# Patient Record
Sex: Male | Born: 1950
Health system: Southern US, Community
[De-identification: ages and names within clinical notes are randomized; demographics above are authoritative.]

## PROBLEM LIST (undated history)

## (undated) DIAGNOSIS — I2510 Atherosclerotic heart disease of native coronary artery without angina pectoris: Secondary | ICD-10-CM

## (undated) DIAGNOSIS — M5030 Other cervical disc degeneration, unspecified cervical region: Secondary | ICD-10-CM

## (undated) DIAGNOSIS — N401 Enlarged prostate with lower urinary tract symptoms: Secondary | ICD-10-CM

## (undated) DIAGNOSIS — G894 Chronic pain syndrome: Secondary | ICD-10-CM

## (undated) DIAGNOSIS — I10 Essential (primary) hypertension: Secondary | ICD-10-CM

## (undated) DIAGNOSIS — E785 Hyperlipidemia, unspecified: Secondary | ICD-10-CM

## (undated) DIAGNOSIS — N529 Male erectile dysfunction, unspecified: Secondary | ICD-10-CM

## (undated) DIAGNOSIS — M1990 Unspecified osteoarthritis, unspecified site: Secondary | ICD-10-CM

## (undated) DIAGNOSIS — M5137 Other intervertebral disc degeneration, lumbosacral region: Secondary | ICD-10-CM

## (undated) DIAGNOSIS — J4540 Moderate persistent asthma, uncomplicated: Secondary | ICD-10-CM

## (undated) DIAGNOSIS — K5909 Other constipation: Secondary | ICD-10-CM

## (undated) DIAGNOSIS — E119 Type 2 diabetes mellitus without complications: Secondary | ICD-10-CM

## (undated) DIAGNOSIS — E782 Mixed hyperlipidemia: Secondary | ICD-10-CM

## (undated) DIAGNOSIS — Z7901 Long term (current) use of anticoagulants: Secondary | ICD-10-CM

## (undated) DIAGNOSIS — J45909 Unspecified asthma, uncomplicated: Secondary | ICD-10-CM

## (undated) DIAGNOSIS — R0602 Shortness of breath: Secondary | ICD-10-CM

## (undated) DIAGNOSIS — J449 Chronic obstructive pulmonary disease, unspecified: Secondary | ICD-10-CM

## (undated) DIAGNOSIS — Z972 Presence of dental prosthetic device (complete) (partial): Secondary | ICD-10-CM

## (undated) DIAGNOSIS — Z8709 Personal history of other diseases of the respiratory system: Secondary | ICD-10-CM

## (undated) DIAGNOSIS — M51379 Other intervertebral disc degeneration, lumbosacral region without mention of lumbar back pain or lower extremity pain: Secondary | ICD-10-CM

## (undated) DIAGNOSIS — Z77090 Contact with and (suspected) exposure to asbestos: Secondary | ICD-10-CM

## (undated) HISTORY — DX: Hyperlipidemia, unspecified: E78.5

## (undated) HISTORY — DX: Chronic obstructive pulmonary disease, unspecified: J44.9

## (undated) HISTORY — PX: JOINT REPLACEMENT: SHX530

## (undated) HISTORY — PX: PROSTATE BIOPSY: SHX241

## (undated) HISTORY — DX: Atherosclerotic heart disease of native coronary artery without angina pectoris: I25.10

---

## 1999-11-30 ENCOUNTER — Emergency Department (HOSPITAL_COMMUNITY): Admission: EM | Admit: 1999-11-30 | Discharge: 1999-11-30 | Payer: Self-pay | Admitting: Emergency Medicine

## 2000-11-09 ENCOUNTER — Emergency Department (HOSPITAL_COMMUNITY): Admission: EM | Admit: 2000-11-09 | Discharge: 2000-11-09 | Payer: Self-pay | Admitting: Emergency Medicine

## 2000-11-09 ENCOUNTER — Encounter: Payer: Self-pay | Admitting: Emergency Medicine

## 2001-08-02 ENCOUNTER — Emergency Department (HOSPITAL_COMMUNITY): Admission: EM | Admit: 2001-08-02 | Discharge: 2001-08-02 | Payer: Self-pay | Admitting: Emergency Medicine

## 2001-08-02 ENCOUNTER — Encounter: Payer: Self-pay | Admitting: Emergency Medicine

## 2001-08-28 ENCOUNTER — Emergency Department (HOSPITAL_COMMUNITY): Admission: EM | Admit: 2001-08-28 | Discharge: 2001-08-28 | Payer: Self-pay | Admitting: Emergency Medicine

## 2002-03-25 ENCOUNTER — Emergency Department (HOSPITAL_COMMUNITY): Admission: EM | Admit: 2002-03-25 | Discharge: 2002-03-25 | Payer: Self-pay | Admitting: Emergency Medicine

## 2003-03-28 ENCOUNTER — Emergency Department (HOSPITAL_COMMUNITY): Admission: EM | Admit: 2003-03-28 | Discharge: 2003-03-28 | Payer: Self-pay | Admitting: Emergency Medicine

## 2008-02-01 ENCOUNTER — Emergency Department (HOSPITAL_COMMUNITY): Admission: EM | Admit: 2008-02-01 | Discharge: 2008-02-01 | Payer: Self-pay | Admitting: Emergency Medicine

## 2008-02-18 ENCOUNTER — Emergency Department (HOSPITAL_COMMUNITY): Admission: EM | Admit: 2008-02-18 | Discharge: 2008-02-18 | Payer: Self-pay | Admitting: Emergency Medicine

## 2008-09-29 ENCOUNTER — Ambulatory Visit (HOSPITAL_COMMUNITY): Admission: RE | Admit: 2008-09-29 | Discharge: 2008-09-29 | Payer: Self-pay | Admitting: Orthopedic Surgery

## 2008-10-22 ENCOUNTER — Emergency Department (HOSPITAL_COMMUNITY): Admission: EM | Admit: 2008-10-22 | Discharge: 2008-10-22 | Payer: Self-pay | Admitting: Family Medicine

## 2008-10-23 HISTORY — PX: TOTAL HIP ARTHROPLASTY: SHX124

## 2008-11-09 ENCOUNTER — Emergency Department (HOSPITAL_COMMUNITY): Admission: EM | Admit: 2008-11-09 | Discharge: 2008-11-09 | Payer: Self-pay | Admitting: Emergency Medicine

## 2009-01-05 ENCOUNTER — Encounter: Admission: RE | Admit: 2009-01-05 | Discharge: 2009-01-05 | Payer: Self-pay | Admitting: Family Medicine

## 2009-01-11 ENCOUNTER — Inpatient Hospital Stay (HOSPITAL_COMMUNITY): Admission: RE | Admit: 2009-01-11 | Discharge: 2009-01-14 | Payer: Self-pay | Admitting: Orthopedic Surgery

## 2009-01-11 HISTORY — PX: TOTAL HIP ARTHROPLASTY: SHX124

## 2010-07-25 ENCOUNTER — Emergency Department (HOSPITAL_COMMUNITY): Admission: EM | Admit: 2010-07-25 | Discharge: 2010-07-25 | Payer: Self-pay | Admitting: Family Medicine

## 2011-02-02 LAB — PROTIME-INR
INR: 1.1 (ref 0.00–1.49)
INR: 2 — ABNORMAL HIGH (ref 0.00–1.49)
INR: 2.2 — ABNORMAL HIGH (ref 0.00–1.49)
INR: 1 (ref 0.00–1.49)
Prothrombin Time: 14.1 seconds (ref 11.6–15.2)
Prothrombin Time: 23.4 seconds — ABNORMAL HIGH (ref 11.6–15.2)
Prothrombin Time: 26 seconds — ABNORMAL HIGH (ref 11.6–15.2)
Prothrombin Time: 13.8 seconds (ref 11.6–15.2)

## 2011-02-02 LAB — COMPREHENSIVE METABOLIC PANEL
Albumin: 3.7 g/dL (ref 3.5–5.2)
Alkaline Phosphatase: 50 U/L (ref 39–117)
CO2: 30 mEq/L (ref 19–32)
Calcium: 9.8 mg/dL (ref 8.4–10.5)
Chloride: 107 mEq/L (ref 96–112)
Creatinine, Ser: 1.26 mg/dL (ref 0.4–1.5)
GFR calc Af Amer: >60 mL/min (ref >60)
GFR calc non Af Amer: 59 mL/min — ABNORMAL LOW (ref >60)
Sodium: 143 mEq/L (ref 135–145)
Total Bilirubin: 0.5 mg/dL (ref 0.3–1.2)

## 2011-02-02 LAB — URINALYSIS, ROUTINE W REFLEX MICROSCOPIC
Hgb urine dipstick: NEGATIVE
Nitrite: NEGATIVE
Protein, ur: NEGATIVE mg/dL
Specific Gravity, Urine: 1.035 — ABNORMAL HIGH (ref 1.005–1.030)
Urobilinogen, UA: 0.2 mg/dL (ref 0.0–1.0)

## 2011-02-02 LAB — CBC
HCT: 31.1 % — ABNORMAL LOW (ref 39.0–52.0)
MCHC: 32.9 g/dL (ref 30.0–36.0)
MCHC: 33.1 g/dL (ref 30.0–36.0)
MCV: 84.2 fL (ref 78.0–100.0)
Platelets: 188 10*3/uL (ref 150–400)
Platelets: 219 10*3/uL (ref 150–400)
Platelets: 240 10*3/uL (ref 150–400)
Platelets: 231 10*3/uL (ref 150–400)
RBC: 3.67 MIL/uL — ABNORMAL LOW (ref 4.22–5.81)
RBC: 4.31 MIL/uL (ref 4.22–5.81)
RBC: 4.89 MIL/uL (ref 4.22–5.81)
RDW: 13.9 % (ref 11.5–15.5)
WBC: 9.3 10*3/uL (ref 4.0–10.5)
WBC: 9.3 10*3/uL (ref 4.0–10.5)
WBC: 8.4 10*3/uL (ref 4.0–10.5)

## 2011-02-02 LAB — BASIC METABOLIC PANEL
BUN: 10 mg/dL (ref 6–23)
BUN: 6 mg/dL (ref 6–23)
CO2: 28 mEq/L (ref 19–32)
Calcium: 8.1 mg/dL — ABNORMAL LOW (ref 8.4–10.5)
Calcium: 8.4 mg/dL (ref 8.4–10.5)
Creatinine, Ser: 1.04 mg/dL (ref 0.4–1.5)
Creatinine, Ser: 1.27 mg/dL (ref 0.4–1.5)
GFR calc Af Amer: >60 mL/min (ref >60)
GFR calc non Af Amer: >60 mL/min (ref >60)
Glucose, Bld: 137 mg/dL — ABNORMAL HIGH (ref 70–99)
Potassium: 4 mEq/L (ref 3.5–5.1)

## 2011-02-02 LAB — ABO/RH: ABO/RH(D): A POS

## 2011-02-02 LAB — TYPE AND SCREEN
ABO/RH(D): A POS
Antibody Screen: NEGATIVE

## 2011-03-07 NOTE — Discharge Summary (Signed)
NAMEJAMERION, CABELLO                ACCOUNT NO.:  [id number]   MEDICAL RECORD NO.:  [id number]          PATIENT TYPE:  INP   LOCATION:  1602                         FACILITY:  [facility]   PHYSICIAN:  Ollen Gross, M.D.    DATE OF BIRTH:  [birth year]   DATE OF ADMISSION:  01/11/2009  DATE OF DISCHARGE:  01/14/2009                               DISCHARGE SUMMARY   ADMITTING DIAGNOSES:  1. Osteoarthritis right hip.  2. Asthma.  3. History of bronchitis.  4. Hypertension.   DISCHARGE DIAGNOSES:  1. Osteoarthritis right hip status post right total hip replacement      arthroplasty.  2. Mild postop blood loss anemia.  3. Asthma.  4. History of bronchitis.  5. Hypertension.   PROCEDURE:  January 11, 2009, right total hip.  Surgeon [name],  assistant Avel Peace, PA-C.  Surgery done under general anesthesia.   CONSULTATIONS:  None.   BRIEF HISTORY:  The patient is a 60 year old male with severe end-stage  arthritis right hip, aggressive worsening pain dysfunction, failed  operative management and now presents for total hip arthroplasty.   LABORATORY DATA:  Preop CBC showed hemoglobin 13.6, hematocrit 41.2,  white cell count 8.4, platelets 231.  PT/INR 13.8 and 1.0, PTT of 28.  Chem panel on admission within normal limits.  Preop UA negative.  Postop hemoglobin down to 12.0 then 10.2, last H&H stabilized at 10.2  and 31.2.  Serial protimes followed per Coumadin protocol.  Last PT/INR  26.0 and 2.2.  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  Electrolytes remained  within normal limits.   X-RAYS:  Right hip on January 04, 2009, advanced right greater than left  hip osteoarthritis with __________bone and/or focal myositis  __________partially __________bilateral medial thighs.   HOSPITAL COURSE:  The patient was admitted to [facility],  taken to OR, underwent above-stated procedure without complication.  The  patient tolerated seizure well, later transferred to the recovery room  on  orthopedic floor.  Started on PCA and p.o. analgesic pain control  following surgery given 24 hours postop IV antibiotics.  Started on p.o.  and IV analgesics.  Weaned over to p.o. medications.  Started getting up  on day #1.  Had a little bit of lightheadedness, dizziness and his  pressure was a little low, so we held his blood pressure medications and  put them on parameters.  Gave him a little fluids which helped.  He was  feeling better after the fluid resuscitation.  Hemoglobin was stable.  By day #2, he was doing better, walking about 80 feet.  Dressing  changed.  Incision looked good.  Continued to progress well, and by day  #3, he was walking over 200 feet, tolerating his medications and was  discharged home.   DISCHARGE/PLAN:  1. Discharged home on January 14, 2009.  2. Discharge diagnosis above.  3. Discharge medication:  Percocet, Robaxin, Coumadin.  4. Diet:  Low-sodium heart-healthy diet.  5. Activity:  Partial weightbearing 25-50% right leg, hip precautions,      total hip protocol.   FOLLOWUP:  Follow-up in 2 weeks.  DISPOSITION:  Home.   CONDITION ON DISCHARGE:  Improved.      Alexzandrew L. Perkins, P.A.C.      Ollen Gross, M.D.  Electronically Signed    ALP/MEDQ  D:  01/14/2009  T:  01/14/2009  Job:  [id number]   cc:   Ollen Gross, M.D.  Fax: [phone]   Ace Gins, MD

## 2011-03-07 NOTE — Op Note (Signed)
[name], [name]                ACCOUNT NO.:  [id number]   MEDICAL RECORD NO.:  [id number]          PATIENT TYPE:  INP   LOCATION:  0002                         FACILITY:  [facility]   PHYSICIAN:  Ollen Gross, M.D.    DATE OF BIRTH:  1950-11-23   DATE OF PROCEDURE:  01/11/2009  DATE OF DISCHARGE:                               OPERATIVE REPORT   PREOPERATIVE DIAGNOSIS:  Osteoarthritis right hip.   POSTOPERATIVE DIAGNOSIS:  Osteoarthritis right hip.   PROCEDURE:  Right total hip arthroplasty.   SURGEON:  [name].   ASSISTANT:  Avel Peace, PA-C.   ANESTHESIA:  General.   ESTIMATED BLOOD LOSS:  200.   DRAIN:  Hemovac x1.   COMPLICATIONS:  None.   CONDITION:  Stable to recovery.   BRIEF CLINICAL NOTE:  Evan Brock is a 60 year old male with severe end-stage  arthritis of the right hip with progressively worsening pain and  dysfunction.  He has failed nonoperative management and presents for  total hip arthroplasty.   PROCEDURE IN DETAIL:  After the successful administration of general  anesthetic, the patient was placed in the left lateral decubitus  position with the right side up and held with the hip positioner.  The  right lower extremity was isolated from his perineum with plastic drapes  and prepped and draped in the usual sterile fashion.  A short  posterolateral incision was made with 10 blade through subcutaneous  tissue to the level of the fascia lata which was incised in line with  the skin incision.  The sciatic nerve was palpated and protected and the  short external rotators isolated off the femur.  Capsulectomy was  performed and the hip was dislocated.  The center of the femoral head  was marked and a trial prosthesis was placed such that the center of the  trial head corresponds to the center of his native femoral head.  Osteotomy line was marked on the femoral neck and osteotomy made with an  oscillating saw.  The femoral head was removed and the femur  was  retracted anteriorly to gain acetabular exposure.   Acetabular retractors were placed and labrum and osteophytes removed.  Acetabular reaming starts at 47 mm coursing increments of 2-53 mm and  then a 54-mm pinnacle acetabular shell was impacted in anatomic position  with outstanding purchase.  We did not need any provisional screw  fixation.  The apex hole eliminator was placed and then the 36-mm  neutral Ultamet metal liner was placed for metal-on-metal hip  replacement.   The femur was prepared with the canal finder and irrigation.  Axial  reaming was performed to 13.5 mm, proximal reaming to an 85F and the  sleeve machined to a large.  A 85F large trial sleeve was placed with an  18 x 13 stem and 36 plus 8 neck 10 degrees beyond native anteversion.  The 36 plus 0 trial head was placed.  It was a little lax, so we went to  36 plus 6 which had more appropriate soft tissue tension.  He had great  stability with full extension,  full external rotation, 70 degrees  flexion, 40 degrees adduction, 90 degrees internal rotation, 90 degrees  of flexion and 70 degrees of internal rotation.  By placing the right  leg on top of the left it felt like the leg lengths were equal.  The hip  was dislocated and the trials removed.  The permanent 42F large sleeve  was placed with an 18 x 13 stem, 36 plus 8 neck 10 degrees beyond native  anteversion.  A 36 plus 6 head was placed and the hip was reduced with  the same stability parameters.  The wound was copiously irrigated with  saline solution and short rotators reattached to the femur through drill  holes.  The fascia lata was closed over a Hemovac drain with interrupted  #1 Vicryl, subcu closed with #1 and 2-0 Vicryl and subcuticular running  4-0 Monocryl.  The drain was hooked to suction.  The incision cleaned  and dried and Steri-Strips and a bulky sterile dressing applied.  He was  then placed into a knee immobilizer, awakened and transported  to  recovery in stable condition.      Ollen Gross, M.D.  Electronically Signed     FA/MEDQ  D:  01/11/2009  T:  01/11/2009  Job:  [id number]

## 2011-03-07 NOTE — H&P (Signed)
Evan Brock, Evan Brock                ACCOUNT NO.:  [id number]   MEDICAL RECORD NO.:  [id number]          PATIENT TYPE:  INP   LOCATION:                               FACILITY:  [facility]   PHYSICIAN:  Ollen Gross, M.D.    DATE OF BIRTH:  1951-06-11   DATE OF ADMISSION:  01/11/2009  DATE OF DISCHARGE:                              HISTORY & PHYSICAL   CHIEF COMPLAINT:  Right hip pain.   HISTORY OF PRESENT ILLNESS:  The patient is a 61 year old male who has  been seen by [name] for ongoing severe right hip pain that has been  ongoing for over a year now.  It is mainly in the groin radiating down  to the lateral hip and into the knee.  It has progressively gotten  worse.  He has been seen in consultation by [name] in January of  this year.  He has been found to have end-stage arthritis, severe with  bone-on-bone changes and flattening of the femoral head and some erosion  already of the acetabulum with large osteophyte formation.  It is felt  he would benefit from undergoing surgical intervention.  Risks and  benefits have been discussed.  He elects to proceed with surgery.  He  has been seen presurgically by Dr. Noel Journey and felt to be cleared  without reservation.   ALLERGIES:  No known drug allergies.   CURRENT MEDICATIONS:  He is currently on a pain pill and antibiotic to  cover his asthma which appears to be a Z-Pak.  He is on an inhaler and  also takes clonidine.   PAST MEDICAL HISTORY:  Asthma, bronchitis, hypertension.   PAST SURGICAL HISTORY:  Is negative.   FAMILY HISTORY:  Father with arthritis.  Mother deceased age 69 who was  a diabetic.  He has six siblings all in good health.   SOCIAL HISTORY:  Single.  Works with [employer] lab.  Nonsmoker.  One to  three beers a day.  No children.  Five steps entering his home.   REVIEW OF SYSTEMS:  GENERAL:  No fevers, chills, night sweats.  NEURO:  No seizures, syncope or paralysis.  RESPIRATORY:  A little bit of  shortness of breath on exertion.  He has had a little bit of wheezing  which Dr. Larina Bras put him on a Z-Pak for preoperatively.  No shortness of  breath.  No productive cough.  CARDIOVASCULAR:  No chest pain, angina,  orthopnea.  GI:  No nausea, vomiting, diarrhea, constipation.  GU:  No  dysuria, hematuria, discharge.  MUSCULOSKELETAL:  Hip pain.   PHYSICAL EXAMINATION:  VITAL SIGNS:  Pulse 76, respirations 16, blood  pressure 140/88.  GENERAL:  A 60 year old [place] male well-nourished, well-  developed, in no acute distress.  He is alert and oriented, cooperative,  pleasant.  HEENT:  Normocephalic, atraumatic.  Pupils are round and reactive.  EOMs  intact.  NECK:  Neck is supple.  CHEST:  Clear with the exception of very faint stridor anterior chest  wall, does improve with coughing.  HEART:  Regular rate and  rhythm.  No murmur, S1-S2 noted.  ABDOMEN:  Soft, nontender, slightly round.  Bowel sounds present.  RECTAL:  Not done.  Not pertinent to present illness.  GENITALIA:  Not done.  Not pertinent to present illness.  EXTREMITIES:  Right hip range of motion flexion 90 degrees, zero  internal rotation, zero external rotation, no abduction or no adduction.  He has normal knee exam.   IMPRESSION:  Osteoarthritis right hip.   PLAN:  The patient will be admitted to [facility] to undergo a  right total hip replacement arthroplasty.  Surgery will be performed by  Dr. Ollen Gross.      Alexzandrew L. Perkins, P.A.C.      Ollen Gross, M.D.  Electronically Signed    ALP/MEDQ  D:  01/10/2009  T:  01/10/2009  Job:  [id number]   cc:   Ace Gins, MD

## 2011-07-05 ENCOUNTER — Emergency Department (HOSPITAL_COMMUNITY): Payer: Self-pay

## 2011-07-05 ENCOUNTER — Emergency Department (HOSPITAL_COMMUNITY)
Admission: EM | Admit: 2011-07-05 | Discharge: 2011-07-05 | Disposition: A | Discharge status: Home / Self Care | Payer: Self-pay | Attending: Emergency Medicine | Admitting: Emergency Medicine

## 2011-07-05 DIAGNOSIS — R220 Localized swelling, mass and lump, head: Secondary | ICD-10-CM | POA: Insufficient documentation

## 2011-07-05 DIAGNOSIS — I10 Essential (primary) hypertension: Secondary | ICD-10-CM | POA: Insufficient documentation

## 2011-07-05 DIAGNOSIS — R059 Cough, unspecified: Secondary | ICD-10-CM | POA: Insufficient documentation

## 2011-07-05 DIAGNOSIS — R0602 Shortness of breath: Secondary | ICD-10-CM | POA: Insufficient documentation

## 2011-07-05 DIAGNOSIS — M542 Cervicalgia: Secondary | ICD-10-CM | POA: Insufficient documentation

## 2011-07-05 DIAGNOSIS — J45909 Unspecified asthma, uncomplicated: Secondary | ICD-10-CM | POA: Insufficient documentation

## 2011-07-05 DIAGNOSIS — J3489 Other specified disorders of nose and nasal sinuses: Secondary | ICD-10-CM | POA: Insufficient documentation

## 2011-07-05 DIAGNOSIS — R0609 Other forms of dyspnea: Secondary | ICD-10-CM | POA: Insufficient documentation

## 2011-07-05 DIAGNOSIS — R110 Nausea: Secondary | ICD-10-CM | POA: Insufficient documentation

## 2011-07-05 DIAGNOSIS — R0989 Other specified symptoms and signs involving the circulatory and respiratory systems: Secondary | ICD-10-CM | POA: Insufficient documentation

## 2011-07-05 DIAGNOSIS — R05 Cough: Secondary | ICD-10-CM | POA: Insufficient documentation

## 2011-07-05 DIAGNOSIS — E119 Type 2 diabetes mellitus without complications: Secondary | ICD-10-CM | POA: Insufficient documentation

## 2011-07-18 LAB — POCT I-STAT, CHEM 8
Calcium, Ion: 1.15
Glucose, Bld: 109 — ABNORMAL HIGH
HCT: 44
Hemoglobin: 15
Potassium: 4
TCO2: 29

## 2011-07-28 LAB — URINE CULTURE
Colony Count: NO GROWTH
Special Requests: NEGATIVE

## 2011-07-28 LAB — CBC
Platelets: 240 10*3/uL (ref 150–400)
RDW: 14.6 % (ref 11.5–15.5)

## 2011-07-28 LAB — DIFFERENTIAL
Basophils Relative: 1 % (ref 0–1)
Eosinophils Absolute: 0.1 10*3/uL (ref 0.0–0.7)
Lymphs Abs: 1 10*3/uL (ref 0.7–4.0)
Monocytes Absolute: 0.7 10*3/uL (ref 0.1–1.0)
Monocytes Relative: 10 % (ref 3–12)

## 2011-07-28 LAB — URINALYSIS, ROUTINE W REFLEX MICROSCOPIC
Bilirubin Urine: NEGATIVE
Hgb urine dipstick: NEGATIVE
Nitrite: NEGATIVE
Specific Gravity, Urine: 1.02 (ref 1.005–1.030)
Urobilinogen, UA: 0.2 mg/dL (ref 0.0–1.0)
pH: 6.5 (ref 5.0–8.0)

## 2011-07-28 LAB — COMPREHENSIVE METABOLIC PANEL
ALT: 22 U/L (ref 0–53)
Albumin: 3.6 g/dL (ref 3.5–5.2)
Alkaline Phosphatase: 49 U/L (ref 39–117)
Calcium: 9.8 mg/dL (ref 8.4–10.5)
GFR calc Af Amer: >60 mL/min (ref >60)
Potassium: 4.4 mEq/L (ref 3.5–5.1)
Sodium: 140 mEq/L (ref 135–145)
Total Protein: 7.2 g/dL (ref 6.0–8.3)

## 2011-07-28 LAB — APTT: aPTT: 26 seconds (ref 24–37)

## 2011-08-04 ENCOUNTER — Other Ambulatory Visit: Payer: Self-pay | Admitting: Family Medicine

## 2011-08-17 ENCOUNTER — Emergency Department (HOSPITAL_COMMUNITY)
Admission: EM | Admit: 2011-08-17 | Discharge: 2011-08-17 | Disposition: A | Discharge status: Home / Self Care | Payer: Self-pay | Attending: Emergency Medicine | Admitting: Emergency Medicine

## 2011-08-17 ENCOUNTER — Inpatient Hospital Stay (INDEPENDENT_AMBULATORY_CARE_PROVIDER_SITE_OTHER)
Admission: RE | Admit: 2011-08-17 | Discharge: 2011-08-17 | Disposition: A | Discharge status: Home / Self Care | Payer: Self-pay | Source: Ambulatory Visit | Attending: Family Medicine | Admitting: Family Medicine

## 2011-08-17 DIAGNOSIS — I10 Essential (primary) hypertension: Secondary | ICD-10-CM | POA: Insufficient documentation

## 2011-08-17 DIAGNOSIS — E119 Type 2 diabetes mellitus without complications: Secondary | ICD-10-CM | POA: Insufficient documentation

## 2011-08-17 DIAGNOSIS — J45909 Unspecified asthma, uncomplicated: Secondary | ICD-10-CM

## 2011-10-05 ENCOUNTER — Emergency Department (HOSPITAL_COMMUNITY): Payer: Self-pay

## 2011-10-05 ENCOUNTER — Emergency Department (HOSPITAL_COMMUNITY)
Admission: EM | Admit: 2011-10-05 | Discharge: 2011-10-05 | Disposition: A | Discharge status: Home / Self Care | Payer: Self-pay | Attending: Emergency Medicine | Admitting: Emergency Medicine

## 2011-10-05 ENCOUNTER — Encounter: Payer: Self-pay | Admitting: Nurse Practitioner

## 2011-10-05 DIAGNOSIS — IMO0001 Reserved for inherently not codable concepts without codable children: Secondary | ICD-10-CM | POA: Insufficient documentation

## 2011-10-05 DIAGNOSIS — R197 Diarrhea, unspecified: Secondary | ICD-10-CM | POA: Insufficient documentation

## 2011-10-05 DIAGNOSIS — R0602 Shortness of breath: Secondary | ICD-10-CM | POA: Insufficient documentation

## 2011-10-05 DIAGNOSIS — I10 Essential (primary) hypertension: Secondary | ICD-10-CM | POA: Insufficient documentation

## 2011-10-05 DIAGNOSIS — B9789 Other viral agents as the cause of diseases classified elsewhere: Secondary | ICD-10-CM | POA: Insufficient documentation

## 2011-10-05 DIAGNOSIS — R630 Anorexia: Secondary | ICD-10-CM | POA: Insufficient documentation

## 2011-10-05 DIAGNOSIS — R000 Tachycardia, unspecified: Secondary | ICD-10-CM | POA: Insufficient documentation

## 2011-10-05 DIAGNOSIS — R51 Headache: Secondary | ICD-10-CM | POA: Insufficient documentation

## 2011-10-05 DIAGNOSIS — R110 Nausea: Secondary | ICD-10-CM | POA: Insufficient documentation

## 2011-10-05 DIAGNOSIS — R05 Cough: Secondary | ICD-10-CM | POA: Insufficient documentation

## 2011-10-05 DIAGNOSIS — R5383 Other fatigue: Secondary | ICD-10-CM | POA: Insufficient documentation

## 2011-10-05 DIAGNOSIS — R070 Pain in throat: Secondary | ICD-10-CM | POA: Insufficient documentation

## 2011-10-05 DIAGNOSIS — R059 Cough, unspecified: Secondary | ICD-10-CM | POA: Insufficient documentation

## 2011-10-05 DIAGNOSIS — R5381 Other malaise: Secondary | ICD-10-CM | POA: Insufficient documentation

## 2011-10-05 DIAGNOSIS — R509 Fever, unspecified: Secondary | ICD-10-CM | POA: Insufficient documentation

## 2011-10-05 DIAGNOSIS — B349 Viral infection, unspecified: Secondary | ICD-10-CM

## 2011-10-05 HISTORY — DX: Essential (primary) hypertension: I10

## 2011-10-05 LAB — CBC
Hemoglobin: 13.8 g/dL (ref 13.0–17.0)
MCH: 26.3 pg (ref 26.0–34.0)
MCHC: 32.1 g/dL (ref 30.0–36.0)
Platelets: 192 10*3/uL (ref 150–400)

## 2011-10-05 LAB — DIFFERENTIAL
Basophils Absolute: 0 10*3/uL (ref 0.0–0.1)
Basophils Relative: 0 % (ref 0–1)
Eosinophils Absolute: 0 10*3/uL (ref 0.0–0.7)
Lymphocytes Relative: 13 % (ref 12–46)
Monocytes Absolute: 1.2 10*3/uL — ABNORMAL HIGH (ref 0.1–1.0)
Neutrophils Relative %: 75 % (ref 43–77)

## 2011-10-05 LAB — URINALYSIS, ROUTINE W REFLEX MICROSCOPIC
Bilirubin Urine: NEGATIVE
Ketones, ur: 15 mg/dL — AB
Leukocytes, UA: NEGATIVE
Nitrite: NEGATIVE
Protein, ur: NEGATIVE mg/dL
Urobilinogen, UA: 1 mg/dL (ref 0.0–1.0)
pH: 6 (ref 5.0–8.0)

## 2011-10-05 LAB — COMPREHENSIVE METABOLIC PANEL
AST: 39 U/L — ABNORMAL HIGH (ref 0–37)
Albumin: 3.3 g/dL — ABNORMAL LOW (ref 3.5–5.2)
BUN: 16 mg/dL (ref 6–23)
Calcium: 8.5 mg/dL (ref 8.4–10.5)
Chloride: 91 mEq/L — ABNORMAL LOW (ref 96–112)
Creatinine, Ser: 1.14 mg/dL (ref 0.50–1.35)
Total Bilirubin: 0.4 mg/dL (ref 0.3–1.2)
Total Protein: 7.3 g/dL (ref 6.0–8.3)

## 2011-10-05 MED ORDER — SODIUM CHLORIDE 0.9 % IV SOLN
INTRAVENOUS | Status: DC
  Administered 2011-10-05: 125 mL via INTRAVENOUS

## 2011-10-05 MED ORDER — SODIUM CHLORIDE 0.9 % IV BOLUS (SEPSIS)
1000.0000 mL | Freq: Once | INTRAVENOUS | Status: AC
  Administered 2011-10-05: 1000 mL via INTRAVENOUS

## 2011-10-05 MED ORDER — ONDANSETRON HCL 4 MG/2ML IJ SOLN
4.0000 mg | Freq: Once | INTRAMUSCULAR | Status: AC
  Administered 2011-10-05: 4 mg via INTRAVENOUS
  Filled 2011-10-05: qty 2

## 2011-10-05 MED ORDER — ACETAMINOPHEN 325 MG PO TABS
650.0000 mg | ORAL_TABLET | Freq: Once | ORAL | Status: AC
  Administered 2011-10-05: 650 mg via ORAL
  Filled 2011-10-05: qty 2

## 2011-10-05 MED ORDER — PROMETHAZINE HCL 25 MG PO TABS: 25.0000 mg | ORAL_TABLET | Freq: Four times a day (QID) | ORAL | Status: AC | PRN

## 2011-10-05 MED ORDER — ALBUTEROL SULFATE (5 MG/ML) 0.5% IN NEBU
5.0000 mg | INHALATION_SOLUTION | Freq: Once | RESPIRATORY_TRACT | Status: AC
  Administered 2011-10-05: 5 mg via RESPIRATORY_TRACT
  Filled 2011-10-05: qty 1

## 2011-10-05 NOTE — ED Provider Notes (Signed)
History     CSN: [id number] Arrival date & time: 10/05/2011  2:14 PM   First MD Initiated Contact with Patient 10/05/11 1422      Chief Complaint  Patient presents with  . URI    (Consider location/radiation/quality/duration/timing/severity/associated sxs/prior treatment) Patient is a 60 y.o. male presenting with URI. The history is provided by the patient.  URI The primary symptoms include fever, fatigue, sore throat, cough, nausea and myalgias. Primary symptoms do not include abdominal pain, vomiting or rash. The current episode started 3 to 5 days ago.  Symptoms associated with the illness include chills.   patient has had a cough and fever since Sunday. He also had nausea and been unable to eat since Sunday. He states he had diarrhea for a couple days but that has resolved. He said myalgias 2. He states chief meals weak all over and has a headache. He's had some production with the cough. Mild sore throat. No dysuria. He states that he feels bad all over. He does not smoke. No clear sick contacts.  Past Medical History  Diagnosis Date  . Hypertension     Past Surgical History  Procedure Date  . Total hip arthroplasty     History reviewed. No pertinent family history.  History  Substance Use Topics  . Smoking status: Never Smoker   . Smokeless tobacco: Not on file  . Alcohol Use: Yes     social      Review of Systems  Constitutional: Positive for fever, chills and fatigue. Negative for appetite change.  HENT: Positive for sore throat.   Respiratory: Positive for cough and shortness of breath.   Cardiovascular: Negative for chest pain.  Gastrointestinal: Positive for nausea and diarrhea. Negative for vomiting and abdominal pain.  Genitourinary: Negative for dysuria.  Musculoskeletal: Positive for myalgias.  Skin: Negative for rash.  Neurological: Negative for light-headedness.  Psychiatric/Behavioral: Negative for confusion.    Allergies  Review of patient's  allergies indicates no known allergies.  Home Medications  No current outpatient prescriptions on file.  BP 135/82  Pulse 107  Temp(Src) 103 F (39.4 C) (Oral)  Resp 20  Ht 5\' 9"  (1.753 m)  Wt 195 lb (88.451 kg)  BMI 28.80 kg/m2  SpO2 94%  Physical Exam  Nursing note and vitals reviewed. Constitutional: He is oriented to person, place, and time. He appears well-developed and well-nourished.  HENT:  Head: Normocephalic and atraumatic.  Eyes: EOM are normal. Pupils are equal, round, and reactive to light.  Neck: Normal range of motion. Neck supple.  Cardiovascular: Regular rhythm and normal heart sounds.   No murmur heard.      Tachycardia  Pulmonary/Chest: Effort normal. He has wheezes.       Diffuse wheezes and harsh breath sounds  Abdominal: Soft. Bowel sounds are normal. He exhibits no distension and no mass. There is no tenderness. There is no rebound and no guarding.  Musculoskeletal: Normal range of motion. He exhibits no edema.  Neurological: He is alert and oriented to person, place, and time. No cranial nerve deficit.  Skin: Skin is warm and dry.  Psychiatric: He has a normal mood and affect.    ED Course  Procedures (including critical care time)   Labs Reviewed  CBC  DIFFERENTIAL  URINALYSIS, ROUTINE W REFLEX MICROSCOPIC  COMPREHENSIVE METABOLIC PANEL   No results found.   No diagnosis found.    MDM  Cough and fever for the last 2 days. Nauseousness without vomiting. Fever of 103  here. Chest x-ray and lab work pending. Care will be turned over to Dr. Effie Shy.        Juliet Rude. Rubin Payor, MD 10/05/11 [phone]

## 2011-10-05 NOTE — ED Provider Notes (Signed)
Patient received from Dr. Rubin Payor, to evaluate after labs returned ann treatment given. He feels more comfortable after albuterol nebulizer and Zofran injection. He has also received IV fluids. He is tolerating some oral fluids at this time. Evaluation is consistent with a nonspecific viral illness. He has albuterol at home. He'll be given a prescription for Phenergan .  Results for orders placed during the hospital encounter of 10/05/11  CBC      Component Value Range   WBC 10.1  4.0 - 10.5 (K/uL)   RBC 5.24  4.22 - 5.81 (MIL/uL)   Hemoglobin 13.8  13.0 - 17.0 (g/dL)   HCT 16.1  09.6 - 04.5 (%)   MCV 82.1  78.0 - 100.0 (fL)   MCH 26.3  26.0 - 34.0 (pg)   MCHC 32.1  30.0 - 36.0 (g/dL)   RDW 40.9  81.1 - 91.4 (%)   Platelets 192  150 - 400 (K/uL)  DIFFERENTIAL      Component Value Range   Neutrophils Relative 75  43 - 77 (%)   Lymphocytes Relative 13  12 - 46 (%)   Monocytes Relative 12  3 - 12 (%)   Eosinophils Relative 0  0 - 5 (%)   Basophils Relative 0  0 - 1 (%)   Neutro Abs 7.6  1.7 - 7.7 (K/uL)   Lymphs Abs 1.3  0.7 - 4.0 (K/uL)   Monocytes Absolute 1.2 (*) 0.1 - 1.0 (K/uL)   Eosinophils Absolute 0.0  0.0 - 0.7 (K/uL)   Basophils Absolute 0.0  0.0 - 0.1 (K/uL)   WBC Morphology ATYPICAL LYMPHOCYTES     Smear Review LARGE PLATELETS PRESENT    URINALYSIS, ROUTINE W REFLEX MICROSCOPIC      Component Value Range   Color, Urine YELLOW  YELLOW    APPearance CLEAR  CLEAR    Specific Gravity, Urine 1.021  1.005 - 1.030    pH 6.0  5.0 - 8.0    Glucose, UA NEGATIVE  NEGATIVE (mg/dL)   Hgb urine dipstick NEGATIVE  NEGATIVE    Bilirubin Urine NEGATIVE  NEGATIVE    Ketones, ur 15 (*) NEGATIVE (mg/dL)   Protein, ur NEGATIVE  NEGATIVE (mg/dL)   Urobilinogen, UA 1.0  0.0 - 1.0 (mg/dL)   Nitrite NEGATIVE  NEGATIVE    Leukocytes, UA NEGATIVE  NEGATIVE   COMPREHENSIVE METABOLIC PANEL      Component Value Range   Sodium 131 (*) 135 - 145 (mEq/L)   Potassium 4.3  3.5 - 5.1 (mEq/L)   Chloride 91 (*) 96 - 112 (mEq/L)   CO2 29  19 - 32 (mEq/L)   Glucose, Bld 101 (*) 70 - 99 (mg/dL)   BUN 16  6 - 23 (mg/dL)   Creatinine, Ser 7.82  0.50 - 1.35 (mg/dL)   Calcium 8.5  8.4 - 95.6 (mg/dL)   Total Protein 7.3  6.0 - 8.3 (g/dL)   Albumin 3.3 (*) 3.5 - 5.2 (g/dL)   AST 39 (*) 0 - 37 (U/L)   ALT 24  0 - 53 (U/L)   Alkaline Phosphatase 61  39 - 117 (U/L)   Total Bilirubin 0.4  0.3 - 1.2 (mg/dL)   GFR calc non Af Amer 68 (*) >90 (mL/min)   GFR calc Af Amer 78 (*) >90 (mL/min)  Dg Chest 2 View  10/05/2011  *RADIOLOGY REPORT*  Clinical Data: Cough, chest pain, shortness of breath  CHEST - 2 VIEW  Comparison: 07/05/2011; 01/05/2009; 02/18/2008  Findings: Grossly unchanged  cardiac silhouette and mediastinal contours.  Peribronchial thickening again seen about the bilateral hila.  No new focal airspace opacities.  There is persistent mild elevation of left hemidiaphragm.  No pleural effusion or pneumothorax.  Unchanged bones.  IMPRESSION: Grossly unchanged peribronchial thickening without focal airspace opacity to suggest pneumonia.  Original Report Authenticated By: Waynard Reeds, M.D.           Flint Melter, MD 10/09/11 [phone]

## 2011-10-05 NOTE — ED Notes (Signed)
Pt reports cough, body aches, fever, and headache since Sunday. Worse over night. Pt alert and oriented. Placed on droplet precautions. MD Pickering at bedside.

## 2011-10-05 NOTE — ED Notes (Signed)
C/o headache, productive cough, nausea, unable to tolerate oral intake since sunday

## 2013-02-15 ENCOUNTER — Ambulatory Visit (HOSPITAL_COMMUNITY)
Admission: RE | Admit: 2013-02-15 | Discharge: 2013-02-15 | Disposition: A | Discharge status: Home / Self Care | Payer: Medicare Other | Source: Ambulatory Visit | Attending: Internal Medicine | Admitting: Internal Medicine

## 2013-02-15 ENCOUNTER — Other Ambulatory Visit (HOSPITAL_COMMUNITY): Payer: Self-pay | Admitting: Internal Medicine

## 2013-02-15 DIAGNOSIS — M51379 Other intervertebral disc degeneration, lumbosacral region without mention of lumbar back pain or lower extremity pain: Secondary | ICD-10-CM | POA: Insufficient documentation

## 2013-02-15 DIAGNOSIS — M79609 Pain in unspecified limb: Secondary | ICD-10-CM

## 2013-02-15 DIAGNOSIS — M169 Osteoarthritis of hip, unspecified: Secondary | ICD-10-CM | POA: Insufficient documentation

## 2013-02-15 DIAGNOSIS — M545 Low back pain, unspecified: Secondary | ICD-10-CM

## 2013-02-15 DIAGNOSIS — M1610 Unilateral primary osteoarthritis, unspecified hip: Secondary | ICD-10-CM | POA: Insufficient documentation

## 2013-02-15 DIAGNOSIS — M5137 Other intervertebral disc degeneration, lumbosacral region: Secondary | ICD-10-CM | POA: Insufficient documentation

## 2013-02-15 DIAGNOSIS — M47817 Spondylosis without myelopathy or radiculopathy, lumbosacral region: Secondary | ICD-10-CM | POA: Insufficient documentation

## 2013-02-15 DIAGNOSIS — M25559 Pain in unspecified hip: Secondary | ICD-10-CM | POA: Insufficient documentation

## 2013-02-22 ENCOUNTER — Emergency Department (HOSPITAL_COMMUNITY): Payer: Medicare Other

## 2013-02-22 ENCOUNTER — Emergency Department (HOSPITAL_COMMUNITY)
Admission: EM | Admit: 2013-02-22 | Discharge: 2013-02-22 | Disposition: A | Discharge status: Home / Self Care | Payer: Medicare Other | Attending: Emergency Medicine | Admitting: Emergency Medicine

## 2013-02-22 ENCOUNTER — Encounter (HOSPITAL_COMMUNITY): Payer: Self-pay | Admitting: *Deleted

## 2013-02-22 DIAGNOSIS — Z79899 Other long term (current) drug therapy: Secondary | ICD-10-CM | POA: Insufficient documentation

## 2013-02-22 DIAGNOSIS — I10 Essential (primary) hypertension: Secondary | ICD-10-CM | POA: Insufficient documentation

## 2013-02-22 DIAGNOSIS — J45901 Unspecified asthma with (acute) exacerbation: Secondary | ICD-10-CM | POA: Insufficient documentation

## 2013-02-22 DIAGNOSIS — J45909 Unspecified asthma, uncomplicated: Secondary | ICD-10-CM

## 2013-02-22 HISTORY — DX: Unspecified asthma, uncomplicated: J45.909

## 2013-02-22 MED ORDER — ALBUTEROL SULFATE (5 MG/ML) 0.5% IN NEBU
2.5000 mg | INHALATION_SOLUTION | Freq: Once | RESPIRATORY_TRACT | Status: DC
  Filled 2013-02-22: qty 1

## 2013-02-22 MED ORDER — ALBUTEROL SULFATE HFA 108 (90 BASE) MCG/ACT IN AERS
2.0000 | INHALATION_SPRAY | RESPIRATORY_TRACT | Status: DC | PRN
  Administered 2013-02-22: 2 via RESPIRATORY_TRACT
  Filled 2013-02-22: qty 6.7

## 2013-02-22 MED ORDER — METHYLPREDNISOLONE SODIUM SUCC 125 MG IJ SOLR
125.0000 mg | INTRAMUSCULAR | Status: AC
  Administered 2013-02-22: 125 mg via INTRAVENOUS
  Filled 2013-02-22: qty 2

## 2013-02-22 MED ORDER — ALBUTEROL (5 MG/ML) CONTINUOUS INHALATION SOLN
10.0000 mg/h | INHALATION_SOLUTION | RESPIRATORY_TRACT | Status: AC
  Administered 2013-02-22: 10 mg/h via RESPIRATORY_TRACT
  Filled 2013-02-22: qty 20

## 2013-02-22 MED ORDER — PREDNISONE 50 MG PO TABS: 50.0000 mg | ORAL_TABLET | Freq: Every day | ORAL | Status: DC

## 2013-02-22 MED ORDER — ALBUTEROL SULFATE (5 MG/ML) 0.5% IN NEBU
5.0000 mg | INHALATION_SOLUTION | Freq: Once | RESPIRATORY_TRACT | Status: AC
  Administered 2013-02-22: 5 mg via RESPIRATORY_TRACT

## 2013-02-22 MED ORDER — IPRATROPIUM BROMIDE 0.02 % IN SOLN
0.5000 mg | Freq: Once | RESPIRATORY_TRACT | Status: AC
  Administered 2013-02-22: 0.5 mg via RESPIRATORY_TRACT
  Filled 2013-02-22: qty 2.5

## 2013-02-22 NOTE — ED Notes (Signed)
Pt in from home c/o SOB, pt reports onset last night worsening today, pt hx of asthma, pt reports not having a rescue inhaler at this time, pt denies CP, V/D, pt c/o nausea, pt A&O x4, follows commands, speaks in complete sentences

## 2013-02-22 NOTE — ED Provider Notes (Signed)
History     CSN: [id number]  Arrival date & time 02/22/13  0944   First MD Initiated Contact with Patient 02/22/13 [phone]      Chief Complaint  Patient presents with  . Asthma    (Consider location/radiation/quality/duration/timing/severity/associated sxs/prior treatment) HPI Patient presents to the emergency department with an asthma attack.  Patient, states, has been worsening over the last 2 days.  Patient, states he has not had his inhaler.  Patient denies chest pain, nausea, vomiting, diarrhea, abdominal pain, dizziness, syncope, fever, cough, runny nose, sore throat, rash or weakness.  Patient, states, that he did not get all of his inhalers filled at the pharmacy.  Patient, states, that nothing seems to make his condition, better or worse Past Medical History  Diagnosis Date  . Hypertension   . Asthma     Past Surgical History  Procedure Laterality Date  . Total hip arthroplasty      History reviewed. No pertinent family history.  History  Substance Use Topics  . Smoking status: Never Smoker   . Smokeless tobacco: Not on file  . Alcohol Use: Yes     Comment: social      Review of Systems All other systems negative except as documented in the HPI. All pertinent positives and negatives as reviewed in the HPI.  Allergies  Review of patient's allergies indicates no known allergies.  Home Medications   Current Outpatient Rx  Name  Route  Sig  Dispense  Refill  . albuterol (PROVENTIL HFA;VENTOLIN HFA) 108 (90 BASE) MCG/ACT inhaler   Inhalation   Inhale 2 puffs into the lungs every 4 (four) hours as needed. For shortness of breath.          Marland Kitchen amLODipine (NORVASC) 10 MG tablet   Oral   Take 10 mg by mouth daily.         Marland Kitchen atenolol (TENORMIN) 50 MG tablet   Oral   Take 50 mg by mouth daily.         . hydrochlorothiazide (HYDRODIURIL) 25 MG tablet   Oral   Take 25 mg by mouth daily.         Marland Kitchen ibuprofen (ADVIL,MOTRIN) 800 MG tablet   Oral   Take  800 mg by mouth 3 (three) times daily.             BP 152/80  Pulse 101  Temp(Src) 98.1 F (36.7 C) (Oral)  Resp 16  SpO2 96%  Physical Exam  Nursing note and vitals reviewed. Constitutional: He is oriented to person, place, and time. He appears well-developed and well-nourished. No distress.  HENT:  Head: Normocephalic and atraumatic.  Mouth/Throat: Oropharynx is clear and moist.  Eyes: Pupils are equal, round, and reactive to light.  Neck: Normal range of motion. Neck supple.  Cardiovascular: Normal rate, regular rhythm and normal heart sounds.  Exam reveals no gallop and no friction rub.   No murmur heard. Pulmonary/Chest: Effort normal. No respiratory distress. He has wheezes. He has no rales.  Neurological: He is alert and oriented to person, place, and time.  Skin: Skin is warm and dry. No rash noted.    ED Course  Procedures (including critical care time)  Labs Reviewed - No data to display Dg Chest 2 View  02/22/2013  *RADIOLOGY REPORT*  Clinical Data: Wheezing and cough  CHEST - 2 VIEW  Comparison: 10/05/2011  Findings:  The lungs are clear without focal infiltrate, edema, pneumothorax or pleural effusion.  Hyperexpansion raises the question  of underlying emphysema. Cardiopericardial silhouette is at upper limits of normal for size. Imaged bony structures of the thorax are intact.  IMPRESSION: Stable.  No acute findings.   Original Report Authenticated By: Kennith Center, M.D.    the patient is given several rounds of albuterol nebulized treatment, including a one-hour neb treatment.  Patient was also given steroid.  Patient voices he is improved and would like to go home.  Patient was ambulated and does not have any difficulty.  His pulse ox was around 93%, but was feeling no difficulties with ambulation    MDM  MDM Reviewed: vitals and nursing note Interpretation: x-ray            Carlyle Dolly, PA-C 02/23/13 1549

## 2013-02-25 NOTE — ED Provider Notes (Signed)
Medical screening examination/treatment/procedure(s) were performed by non-physician practitioner and as supervising physician I was immediately available for consultation/collaboration.   Loren Racer, MD 02/25/13 [phone]

## 2013-06-02 ENCOUNTER — Emergency Department (HOSPITAL_COMMUNITY): Payer: Medicare Other

## 2013-06-02 ENCOUNTER — Encounter (HOSPITAL_COMMUNITY): Payer: Self-pay | Admitting: Emergency Medicine

## 2013-06-02 ENCOUNTER — Emergency Department (HOSPITAL_COMMUNITY)
Admission: EM | Admit: 2013-06-02 | Discharge: 2013-06-02 | Disposition: A | Discharge status: Home / Self Care | Payer: Medicare Other | Attending: Emergency Medicine | Admitting: Emergency Medicine

## 2013-06-02 DIAGNOSIS — R0682 Tachypnea, not elsewhere classified: Secondary | ICD-10-CM | POA: Insufficient documentation

## 2013-06-02 DIAGNOSIS — R0989 Other specified symptoms and signs involving the circulatory and respiratory systems: Secondary | ICD-10-CM | POA: Insufficient documentation

## 2013-06-02 DIAGNOSIS — R0609 Other forms of dyspnea: Secondary | ICD-10-CM | POA: Insufficient documentation

## 2013-06-02 DIAGNOSIS — I10 Essential (primary) hypertension: Secondary | ICD-10-CM | POA: Insufficient documentation

## 2013-06-02 DIAGNOSIS — R0602 Shortness of breath: Secondary | ICD-10-CM | POA: Insufficient documentation

## 2013-06-02 DIAGNOSIS — Z79899 Other long term (current) drug therapy: Secondary | ICD-10-CM | POA: Insufficient documentation

## 2013-06-02 DIAGNOSIS — R109 Unspecified abdominal pain: Secondary | ICD-10-CM | POA: Insufficient documentation

## 2013-06-02 DIAGNOSIS — J45909 Unspecified asthma, uncomplicated: Secondary | ICD-10-CM | POA: Insufficient documentation

## 2013-06-02 DIAGNOSIS — R079 Chest pain, unspecified: Secondary | ICD-10-CM | POA: Insufficient documentation

## 2013-06-02 LAB — URINALYSIS, ROUTINE W REFLEX MICROSCOPIC
Glucose, UA: NEGATIVE mg/dL
Hgb urine dipstick: NEGATIVE
Leukocytes, UA: NEGATIVE
Protein, ur: NEGATIVE mg/dL
Specific Gravity, Urine: 1.014 (ref 1.005–1.030)

## 2013-06-02 LAB — CBC WITH DIFFERENTIAL/PLATELET
Basophils Absolute: 0 10*3/uL (ref 0.0–0.1)
HCT: 41.6 % (ref 39.0–52.0)
Lymphocytes Relative: 24 % (ref 12–46)
Lymphs Abs: 1.9 10*3/uL (ref 0.7–4.0)
Monocytes Absolute: 0.6 10*3/uL (ref 0.1–1.0)
Neutro Abs: 5.3 10*3/uL (ref 1.7–7.7)
Platelets: 207 10*3/uL (ref 150–400)
RBC: 5.05 MIL/uL (ref 4.22–5.81)
RDW: 14.8 % (ref 11.5–15.5)
WBC: 8.1 10*3/uL (ref 4.0–10.5)

## 2013-06-02 LAB — POCT I-STAT, CHEM 8
Creatinine, Ser: 0.9 mg/dL (ref 0.50–1.35)
Glucose, Bld: 94 mg/dL (ref 70–99)
Hemoglobin: 15.3 g/dL (ref 13.0–17.0)
TCO2: 27 mmol/L (ref 0–100)

## 2013-06-02 LAB — BASIC METABOLIC PANEL
CO2: 25 mEq/L (ref 19–32)
Chloride: 103 mEq/L (ref 96–112)
Glucose, Bld: 91 mg/dL (ref 70–99)
Sodium: 134 mEq/L — ABNORMAL LOW (ref 135–145)

## 2013-06-02 LAB — D-DIMER, QUANTITATIVE: D-Dimer, Quant: 1.57 ug/mL-FEU — ABNORMAL HIGH (ref 0.00–0.48)

## 2013-06-02 MED ORDER — IOHEXOL 350 MG/ML SOLN
100.0000 mL | Freq: Once | INTRAVENOUS | Status: AC | PRN
  Administered 2013-06-02: 60 mL via INTRAVENOUS

## 2013-06-02 MED ORDER — TRAMADOL HCL 50 MG PO TABS
50.0000 mg | ORAL_TABLET | Freq: Once | ORAL | Status: AC
  Administered 2013-06-02: 50 mg via ORAL
  Filled 2013-06-02: qty 1

## 2013-06-02 MED ORDER — PREDNISONE 20 MG PO TABS
60.0000 mg | ORAL_TABLET | Freq: Once | ORAL | Status: AC
  Administered 2013-06-02: 60 mg via ORAL
  Filled 2013-06-02: qty 3

## 2013-06-02 MED ORDER — ALBUTEROL SULFATE (5 MG/ML) 0.5% IN NEBU
2.5000 mg | INHALATION_SOLUTION | Freq: Once | RESPIRATORY_TRACT | Status: AC
  Administered 2013-06-02: 2.5 mg via RESPIRATORY_TRACT
  Filled 2013-06-02: qty 0.5

## 2013-06-02 MED ORDER — IPRATROPIUM BROMIDE 0.02 % IN SOLN
0.5000 mg | Freq: Once | RESPIRATORY_TRACT | Status: AC
Start: 2013-06-02 — End: 2013-06-02
  Administered 2013-06-02: 0.5 mg via RESPIRATORY_TRACT
  Filled 2013-06-02: qty 2.5

## 2013-06-02 NOTE — ED Provider Notes (Addendum)
I saw and evaluated the patient, reviewed the resident's note and I agree with the findings and plan.  Results for orders placed during the hospital encounter of 06/02/13  URINALYSIS, ROUTINE W REFLEX MICROSCOPIC      Result Value Range   Color, Urine YELLOW  YELLOW   APPearance CLEAR  CLEAR   Specific Gravity, Urine 1.014  1.005 - 1.030   pH 6.0  5.0 - 8.0   Glucose, UA NEGATIVE  NEGATIVE mg/dL   Hgb urine dipstick NEGATIVE  NEGATIVE   Bilirubin Urine NEGATIVE  NEGATIVE   Ketones, ur NEGATIVE  NEGATIVE mg/dL   Protein, ur NEGATIVE  NEGATIVE mg/dL   Urobilinogen, UA 1.0  0.0 - 1.0 mg/dL   Nitrite NEGATIVE  NEGATIVE   Leukocytes, UA NEGATIVE  NEGATIVE  CBC WITH DIFFERENTIAL      Result Value Range   WBC 8.1  4.0 - 10.5 K/uL   RBC 5.05  4.22 - 5.81 MIL/uL   Hemoglobin 13.9  13.0 - 17.0 g/dL   HCT 16.1  09.6 - 04.5 %   MCV 82.4  78.0 - 100.0 fL   MCH 27.5  26.0 - 34.0 pg   MCHC 33.4  30.0 - 36.0 g/dL   RDW 40.9  81.1 - 91.4 %   Platelets 207  150 - 400 K/uL   Neutrophils Relative % 65  43 - 77 %   Neutro Abs 5.3  1.7 - 7.7 K/uL   Lymphocytes Relative 24  12 - 46 %   Lymphs Abs 1.9  0.7 - 4.0 K/uL   Monocytes Relative 7  3 - 12 %   Monocytes Absolute 0.6  0.1 - 1.0 K/uL   Eosinophils Relative 3  0 - 5 %   Eosinophils Absolute 0.2  0.0 - 0.7 K/uL   Basophils Relative 1  0 - 1 %   Basophils Absolute 0.0  0.0 - 0.1 K/uL  BASIC METABOLIC PANEL      Result Value Range   Sodium 134 (*) 135 - 145 mEq/L   Potassium 5.5 (*) 3.5 - 5.1 mEq/L   Chloride 103  96 - 112 mEq/L   CO2 25  19 - 32 mEq/L   Glucose, Bld 91  70 - 99 mg/dL   BUN 12  6 - 23 mg/dL   Creatinine, Ser 7.82  0.50 - 1.35 mg/dL   Calcium 9.4  8.4 - 95.6 mg/dL   GFR calc non Af Amer 90 (*) >90 mL/min   GFR calc Af Amer >90  >90 mL/min  D-DIMER, QUANTITATIVE      Result Value Range   D-Dimer, Quant 1.57 (*) 0.00 - 0.48 ug/mL-FEU  POCT I-STAT, CHEM 8      Result Value Range   Sodium 140  135 - 145 mEq/L   Potassium  4.3  3.5 - 5.1 mEq/L   Chloride 103  96 - 112 mEq/L   BUN 12  6 - 23 mg/dL   Creatinine, Ser 2.13  0.50 - 1.35 mg/dL   Glucose, Bld 94  70 - 99 mg/dL   Calcium, Ion 0.86  5.78 - 1.30 mmol/L   TCO2 27  0 - 100 mmol/L   Hemoglobin 15.3  13.0 - 17.0 g/dL   HCT 46.9  62.9 - 52.8 %  POCT I-STAT TROPONIN I      Result Value Range   Troponin i, poc 0.00  0.00 - 0.08 ng/mL   Comment 3  Dg Chest 2 View  06/02/2013   *RADIOLOGY REPORT*  Clinical Data: Shortness of breath, left-sided posterior chest pain, history hypertension  CHEST - 2 VIEW  Comparison: 02/22/2013; 10/05/2011  Findings: Grossly unchanged cardiac silhouette and mediastinal contours with mild tortuosity of the thoracic aorta. The lungs appear mildly hyperexpanded with flattening of the bilateral hemidiaphragms and blunting of bilateral costophrenic angles.  No focal airspace opacities.  No pleural effusion or pneumothorax.  No definite evidence of edema.  Unchanged bones.  IMPRESSION: Mild lung hyperexpansion without acute cardiopulmonary disease.   Original Report Authenticated By: Tacey Ruiz, MD   Ct Angio Chest W/cm &/or Wo Cm  06/02/2013   *RADIOLOGY REPORT*  Clinical Data: Wheezing, posterior chest pain  CT ANGIOGRAPHY CHEST  Technique:  Multidetector CT imaging of the chest using the standard protocol during bolus administration of intravenous contrast. Multiplanar reconstructed images including MIPs were obtained and reviewed to evaluate the vascular anatomy.  Contrast: 60mL OMNIPAQUE IOHEXOL 350 MG/ML SOLN  Comparison: 06/02/2013  Findings: Pulmonary arteries well visualized.  No filling defect or significant pulmonary embolus demonstrated by CTA.  Normal heart size.  No pericardial or pleural effusion.  No adenopathy. Coronary calcifications evident.  Included upper abdomen demonstrates no acute process.  Lung windows demonstrate minor bilateral lower lobe diffuse bronchial wall thickening without significant  bronchiectasis. There is associated lower lobe scattered areas of mucous plugging. Best demonstrated on image 59, series 6.  Minimal subpleural basilar tree in bud pattern, images 67 and 74.  Minor associated basilar atelectasis.  Pattern is concerning for bibasilar bronchitis / bronchiolitis with some associated mucous plugging.  No suspicious pulmonary mass or nodule.  Minor degenerative changes of the thoracic spine.  No compression fracture.  Sternum appears intact.  IMPRESSION: Bilateral lower lobe diffuse bronchial thickening with scattered areas of mild mucous plugging and associated peripheral "tree in bud" pattern.  Appearance is suspicious for bibasilar bronchitis / bronchiolitis.  Negative for significant acute pulmonary embolus.   Original Report Authenticated By: Judie Petit. Miles Costain, M.D.    Patient seen by me. Patient's chest x-ray raises some concern for like a bronchitis patient clinically without any symptoms consistent with this. Symptoms could be consistent with a pulmonary embolism that has been ruled out. Patient's EKG reviewed by me as no acute changes on it concur with the resident's interpretation. Patient can be discharged home and followup with his regular doctor. Patient has had pain in the left CVA area now for several months on and off but got worse here today patient's immediate concern was that was related to his kidneys patient has not had any blood in his urine urinalysis negative do not think that this is consistent with a kidney stone.  Shelda Jakes, MD 06/02/13 1610  Shelda Jakes, MD 06/02/13 2042

## 2013-06-02 NOTE — ED Notes (Signed)
Pt arrives w/ wife wheezing and c/o back pain rt side.

## 2013-06-02 NOTE — ED Notes (Signed)
Pt. Stated, I started having back pain on the upper sides so I'm not sure if its my back or my kidneys.  No injury to my back.

## 2013-06-02 NOTE — ED Notes (Signed)
EKG was shot in triage.

## 2013-06-02 NOTE — ED Provider Notes (Signed)
CSN: [id number]     Arrival date & time 06/02/13  1245 History     First MD Initiated Contact with Patient 06/02/13 1252     Chief Complaint  Patient presents with  . Back Pain  . Flank Pain   (Consider location/radiation/quality/duration/timing/severity/associated sxs/prior Treatment) HPI Comments: Patient is a 62 year old male with a history of hypertension and asthma who presents for L back pain x3 days. Pain has been intermittent with onset Friday, spontaneously resolving Friday evening, and recurring last night. Symptoms have been persistent since last night without any alleviating factors. Pain present inferior to L scapula and radiates around to lower left chest. Patient has tried topical heat packs without relief. States that pain is worse with deep breathing and certain movements; not aggravated with palpation. Patient also states he began to feel short of breath shortly before arrival in ED. He states that symptoms are consistent with acute asthma exacerbation, but is unable to speak to other shortness of breath is related to his back pain. Patient denies associated fever, diaphoresis, syncope, lightheadedness, dizziness, central/substernal chest pain, hemoptysis, nausea/vomiting, abdominal pain, numbness/tingling, and extremity weakness. Further denies hx of kidney stones, recent trauma, falls, or injury to his back, hx of ACS, CAD, CHF, PE/DVT, hormone replacement, recent surgeries or hospitalizations, or recent long car trips/plane flights.  The history is provided by the patient. No language interpreter was used.    Past Medical History  Diagnosis Date  . Hypertension   . Asthma    Past Surgical History  Procedure Laterality Date  . Total hip arthroplasty     No family history on file. History  Substance Use Topics  . Smoking status: Never Smoker   . Smokeless tobacco: Not on file  . Alcohol Use: Yes     Comment: social    Review of Systems  Constitutional: Negative  for fever.  Eyes: Negative for visual disturbance.  Respiratory: Positive for shortness of breath.   Cardiovascular: Negative for chest pain.  Gastrointestinal: Negative for nausea and vomiting.  Genitourinary: Negative for dysuria and hematuria.  Musculoskeletal: Positive for back pain.  Neurological: Negative for dizziness, syncope and light-headedness.  All other systems reviewed and are negative.   Allergies  Review of patient's allergies indicates no known allergies.  Home Medications   Current Outpatient Rx  Name  Route  Sig  Dispense  Refill  . atenolol (TENORMIN) 50 MG tablet   Oral   Take 50 mg by mouth every morning.          . hydrochlorothiazide (HYDRODIURIL) 25 MG tablet   Oral   Take 25 mg by mouth every morning.          Marland Kitchen ibuprofen (ADVIL,MOTRIN) 200 MG tablet   Oral   Take 800 mg by mouth 3 (three) times daily.         Marland Kitchen albuterol (PROVENTIL HFA;VENTOLIN HFA) 108 (90 BASE) MCG/ACT inhaler   Inhalation   Inhale 2 puffs into the lungs every 4 (four) hours as needed. For shortness of breath.          BP 153/86  Pulse 65  Temp(Src) 98.2 F (36.8 C) (Oral)  Resp 20  SpO2 99%  Physical Exam  Nursing note and vitals reviewed. Constitutional: He is oriented to person, place, and time. He appears well-developed and well-nourished. No distress.  HENT:  Head: Normocephalic and atraumatic.  Mouth/Throat: Oropharynx is clear and moist. No oropharyngeal exudate.  Eyes: Conjunctivae and EOM are normal. Pupils are equal,  round, and reactive to light. No scleral icterus.  Neck: Normal range of motion. Neck supple.  Cardiovascular: Normal rate, regular rhythm, normal heart sounds and intact distal pulses.   Pulmonary/Chest: No respiratory distress. He has wheezes (expiratory). He has no rales. He exhibits no tenderness.  Patient mildly tachypneic. No accessory muscle use or retractions.  Abdominal: Soft. There is no tenderness. There is no rebound and no  guarding.  Musculoskeletal: Normal range of motion.  Neurological: He is alert and oriented to person, place, and time.  Patient speaks in full goal oriented sentences. Moves extremities without ataxia.  Skin: Skin is warm and dry. No rash noted. He is not diaphoretic. No erythema. No pallor.  Psychiatric: He has a normal mood and affect. His behavior is normal.   ED Course   Procedures (including critical care time)  Labs Reviewed  D-DIMER, QUANTITATIVE - Abnormal; Notable for the following:    D-Dimer, Quant 1.57 (*)    All other components within normal limits  URINALYSIS, ROUTINE W REFLEX MICROSCOPIC  CBC WITH DIFFERENTIAL  BASIC METABOLIC PANEL    Date: 06/02/2013  Rate: 79  Rhythm: normal sinus rhythm  QRS Axis: normal  Intervals: normal  ST/T Wave abnormalities: normal  Conduction Disutrbances:none  Narrative Interpretation: NSR; no STEMI or ischemic changes  Old EKG Reviewed: unchanged from 02/22/13 I have personally reviewed and interpreted this EKG  Dg Chest 2 View  06/02/2013   *RADIOLOGY REPORT*  Clinical Data: Shortness of breath, left-sided posterior chest pain, history hypertension  CHEST - 2 VIEW  Comparison: 02/22/2013; 10/05/2011  Findings: Grossly unchanged cardiac silhouette and mediastinal contours with mild tortuosity of the thoracic aorta. The lungs appear mildly hyperexpanded with flattening of the bilateral hemidiaphragms and blunting of bilateral costophrenic angles.  No focal airspace opacities.  No pleural effusion or pneumothorax.  No definite evidence of edema.  Unchanged bones.  IMPRESSION: Mild lung hyperexpansion without acute cardiopulmonary disease.   Original Report Authenticated By: Tacey Ruiz, MD   No diagnosis found.  MDM  37:67 - 62 year old male with a history of hypertension and asthma who presents for L back pain x3 days. Pain present inferior to L scapula and radiates around to lower left chest/upper abdomen; pleuritic in nature. Patient  dyspneic and tachypneic on arrival which he attributes to asthma exacerbation with onset PTA. CBC without leukocytosis or anemia and UA without evidence of hematuria or infection. Chest x-ray without evidence of pneumonia, pneumothorax, pleural effusion, or other acute cardiopulmonary abnormality. EKG unchanged from prior. BMP and D dimer pending. Patient endorses improvement in shortness of breath with albuterol and Atrovent nebulizer treatment as well as by mouth prednisone. Ultram ordered for pain control.  3:28 - D-dimer elevated to 1.57. CT anterior chest ordered to evaluate for PE.  3:45 - Patient signed out to oncoming provider, Dr. Alvira Monday, for further evaluation and dispo.       Antony Madura, PA-C 06/02/13 1550

## 2013-06-03 NOTE — ED Provider Notes (Signed)
Medical screening examination/treatment/procedure(s) were performed by non-physician practitioner and as supervising physician I was immediately available for consultation/collaboration.  Juliet Rude. Rubin Payor, MD 06/03/13 1610

## 2013-06-10 ENCOUNTER — Emergency Department (HOSPITAL_COMMUNITY)
Admission: EM | Admit: 2013-06-10 | Discharge: 2013-06-10 | Disposition: A | Discharge status: Home / Self Care | Payer: Medicare Other | Attending: Emergency Medicine | Admitting: Emergency Medicine

## 2013-06-10 ENCOUNTER — Encounter (HOSPITAL_COMMUNITY): Payer: Self-pay | Admitting: *Deleted

## 2013-06-10 ENCOUNTER — Emergency Department (HOSPITAL_COMMUNITY): Payer: Medicare Other

## 2013-06-10 DIAGNOSIS — I10 Essential (primary) hypertension: Secondary | ICD-10-CM | POA: Insufficient documentation

## 2013-06-10 DIAGNOSIS — M25559 Pain in unspecified hip: Secondary | ICD-10-CM | POA: Insufficient documentation

## 2013-06-10 DIAGNOSIS — J45909 Unspecified asthma, uncomplicated: Secondary | ICD-10-CM | POA: Insufficient documentation

## 2013-06-10 DIAGNOSIS — R10A2 Flank pain, left side: Secondary | ICD-10-CM

## 2013-06-10 DIAGNOSIS — R109 Unspecified abdominal pain: Secondary | ICD-10-CM | POA: Insufficient documentation

## 2013-06-10 DIAGNOSIS — Z96649 Presence of unspecified artificial hip joint: Secondary | ICD-10-CM | POA: Insufficient documentation

## 2013-06-10 DIAGNOSIS — M25552 Pain in left hip: Secondary | ICD-10-CM

## 2013-06-10 DIAGNOSIS — Z79899 Other long term (current) drug therapy: Secondary | ICD-10-CM | POA: Insufficient documentation

## 2013-06-10 LAB — CBC WITH DIFFERENTIAL/PLATELET
Basophils Absolute: 0.1 10*3/uL (ref 0.0–0.1)
Eosinophils Absolute: 0.2 10*3/uL (ref 0.0–0.7)
Lymphocytes Relative: 21 % (ref 12–46)
Lymphs Abs: 1.7 10*3/uL (ref 0.7–4.0)
MCH: 27.4 pg (ref 26.0–34.0)
Neutrophils Relative %: 65 % (ref 43–77)
Platelets: 257 10*3/uL (ref 150–400)
RBC: 5.41 MIL/uL (ref 4.22–5.81)
WBC: 7.9 10*3/uL (ref 4.0–10.5)

## 2013-06-10 LAB — POCT I-STAT TROPONIN I: Troponin i, poc: 0 ng/mL (ref 0.00–0.08)

## 2013-06-10 LAB — URINALYSIS, ROUTINE W REFLEX MICROSCOPIC
Glucose, UA: NEGATIVE mg/dL
Leukocytes, UA: NEGATIVE
Nitrite: NEGATIVE
Protein, ur: NEGATIVE mg/dL

## 2013-06-10 LAB — COMPREHENSIVE METABOLIC PANEL
BUN: 12 mg/dL (ref 6–23)
CO2: 30 mEq/L (ref 19–32)
Chloride: 100 mEq/L (ref 96–112)
Creatinine, Ser: 1.1 mg/dL (ref 0.50–1.35)
GFR calc non Af Amer: 70 mL/min — ABNORMAL LOW (ref >90)
Total Bilirubin: 0.4 mg/dL (ref 0.3–1.2)

## 2013-06-10 MED ORDER — HYDROCODONE-ACETAMINOPHEN 5-325 MG PO TABS: 1.0000 | ORAL_TABLET | ORAL | Status: DC | PRN

## 2013-06-10 MED ORDER — KETOROLAC TROMETHAMINE 30 MG/ML IJ SOLN
30.0000 mg | Freq: Once | INTRAMUSCULAR | Status: AC
  Administered 2013-06-10: 30 mg via INTRAVENOUS
  Filled 2013-06-10: qty 1

## 2013-06-10 MED ORDER — HYDROMORPHONE HCL PF 1 MG/ML IJ SOLN
1.0000 mg | Freq: Once | INTRAMUSCULAR | Status: AC
  Administered 2013-06-10: 1 mg via INTRAVENOUS
  Filled 2013-06-10: qty 1

## 2013-06-10 NOTE — ED Notes (Signed)
Pt returned from radiology.

## 2013-06-10 NOTE — ED Notes (Signed)
Pt was seen here last week with left flank pain and now with pain to left upper and lower abdominal area.  No nvd or constipation

## 2013-06-10 NOTE — Discharge Instructions (Signed)
Take motrin for pain. Use ice and heat as needed. For severe pain take norco or vicodin however realize they have the potential for addiction and it can make you sleepy and has tylenol in it.  No operating machinery while taking. If you were given medicines take as directed.  If you are on coumadin or contraceptives realize their levels and effectiveness is altered by many different medicines.  If you have any reaction (rash, tongues swelling, other) to the medicines stop taking and see a physician.   Please follow up as directed and return to the ER or see a physician for new or worsening symptoms.  Thank you.

## 2013-06-10 NOTE — ED Provider Notes (Signed)
CSN: [id number]     Arrival date & time 06/10/13  1407 History     First MD Initiated Contact with Patient 06/10/13 1724     Chief Complaint  Patient presents with  . Abdominal Pain  . Flank Pain   (Consider location/radiation/quality/duration/timing/severity/associated sxs/prior Treatment) HPI Comments: 62 yo male with left flank pain since last Monday, pt was seen in ED and had CTA chest with no acute findings.  He has had pain since, constant, sharp.   No injury.  No kidney stone hx but pain is not improving.  Thought may be msk.  No fevers.  Nothing worsens.  HTN hx.  Patient is a 62 y.o. male presenting with abdominal pain and flank pain. The history is provided by the patient.  Abdominal Pain Associated symptoms: no chest pain, no chills, no dysuria, no fever, no shortness of breath and no vomiting   Flank Pain Associated symptoms include abdominal pain. Pertinent negatives include no chest pain, no headaches and no shortness of breath.    Past Medical History  Diagnosis Date  . Hypertension   . Asthma    Past Surgical History  Procedure Laterality Date  . Total hip arthroplasty     No family history on file. History  Substance Use Topics  . Smoking status: Never Smoker   . Smokeless tobacco: Not on file  . Alcohol Use: Yes     Comment: social    Review of Systems  Constitutional: Negative for fever and chills.  HENT: Negative for neck pain and neck stiffness.   Eyes: Negative for visual disturbance.  Respiratory: Negative for shortness of breath.   Cardiovascular: Negative for chest pain.  Gastrointestinal: Positive for abdominal pain. Negative for vomiting.  Genitourinary: Positive for flank pain. Negative for dysuria.  Musculoskeletal: Negative for back pain.  Skin: Negative for rash.  Neurological: Negative for light-headedness and headaches.    Allergies  Review of patient's allergies indicates no known allergies.  Home Medications   Current  Outpatient Rx  Name  Route  Sig  Dispense  Refill  . albuterol (PROVENTIL HFA;VENTOLIN HFA) 108 (90 BASE) MCG/ACT inhaler   Inhalation   Inhale 2 puffs into the lungs every 4 (four) hours as needed. For shortness of breath.         Marland Kitchen atenolol (TENORMIN) 50 MG tablet   Oral   Take 50 mg by mouth every morning.          . hydrochlorothiazide (HYDRODIURIL) 25 MG tablet   Oral   Take 25 mg by mouth every morning.          Marland Kitchen ibuprofen (ADVIL,MOTRIN) 200 MG tablet   Oral   Take 800 mg by mouth 3 (three) times daily.          BP 160/99  Pulse 73  Temp(Src) 97.9 F (36.6 C) (Oral)  Resp 18  SpO2 95% Physical Exam  Nursing note and vitals reviewed. Constitutional: He is oriented to person, place, and time. He appears well-developed and well-nourished.  HENT:  Head: Normocephalic and atraumatic.  Eyes: Conjunctivae are normal. Right eye exhibits no discharge. Left eye exhibits no discharge.  Neck: Normal range of motion. Neck supple. No tracheal deviation present.  Cardiovascular: Normal rate and regular rhythm.   Pulmonary/Chest: Effort normal and breath sounds normal.  Abdominal: Soft. He exhibits no distension. There is no tenderness. There is no guarding.  Musculoskeletal: He exhibits tenderness (mild left flank). He exhibits no edema.  Neurological: He is  alert and oriented to person, place, and time.  Skin: Skin is warm. No rash noted.  Psychiatric: He has a normal mood and affect.    ED Course   Procedures (including critical care time)\ EMERGENCY DEPARTMENT ULTRASOUND  Study: Limited Retroperitoneal Ultrasound of the Abdominal Aorta.  INDICATIONS:Back pain and Age>55 Multiple views of the abdominal aorta were obtained in real-time from the diaphragmatic hiatus to the aortic bifurcation in transverse planes with a multi-frequency probe. PERFORMED BY: Myself IMAGES ARCHIVED?: Yes FINDINGS: No AAA, all views less than 2.5 cm diameter LIMITATIONS:  Bowel  gas INTERPRETATION:  No abdominal aortic aneurysm  Emergency Focused Ultrasound Exam Limited retroperitoneal ultrasound of kidneys  Performed and interpreted by Dr. Jodi Mourning Indication: flank pain Focused abdominal ultrasound with both kidneys imaged in transverse and longitudinal planes in real-time. Interpretation:  No hydronephrosis visualized.  No stones or cysts visualized  Images archived electronically    Labs Reviewed  COMPREHENSIVE METABOLIC PANEL - Abnormal; Notable for the following:    Glucose, Bld 117 (*)    GFR calc non Af Amer 70 (*)    GFR calc Af Amer 81 (*)    All other components within normal limits  URINALYSIS, ROUTINE W REFLEX MICROSCOPIC - Abnormal; Notable for the following:    Bilirubin Urine SMALL (*)    All other components within normal limits  LIPASE, BLOOD  CBC WITH DIFFERENTIAL  POCT I-STAT TROPONIN I   No results found. No diagnosis found.  MDM  Left flank pain.  Concern for stone, pt unable to get comfortable.  With age and flank pain bedside US to rule out AAA. No hydro on [place].  Pain meds given, CT stone ordered.  Pain improved in ED.  Ct Abdomen Pelvis Wo Contrast  06/10/2013   *RADIOLOGY REPORT*  Clinical Data: abdominal pain and flank pain  CT ABDOMEN AND PELVIS WITHOUT CONTRAST  Technique:  Multidetector CT imaging of the abdomen and pelvis was performed following the standard protocol without intravenous contrast.  Comparison: None.  Findings: Bronchiectasis is noted in both lower lobes.  There are patchy peripheral densities noted in the left lower lobe posteriorly.  No focal liver abnormality identified.  The gallbladder appears within normal limits.  No biliary dilatation identified.  Normal appearance of the pancreas.  The spleen appears normal.  Normal appearance of both adrenal glands.  The right kidney is normal.  Normal appearance of the left kidney.  There is no left-sided nephrolithiasis or hydronephrosis.  Urinary bladder is largely  obscured by beam hardening artifact from the patient's right hip arthroplasty device.  Calcified atherosclerotic change involves the abdominal aorta. There is no aneurysm.  No upper abdominal adenopathy identified. There is a right external iliac lymph node which is upper limits of normal measuring 1.2 cm, image 62/series 2.  No free fluid or fluid collections noted within the abdomen or pelvis.  The stomach appears normal.  The small bowel loops have a normal course and caliber without evidence for bowel obstruction.  The appendix is visualized and appears normal.  Normal appearance of the colon.  No free fluid or abnormal fluid collections within the abdomen or pelvis.  Small periumbilical hernia contains fat only.  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  No aggressive lytic or sclerotic bone lesions identified.  Severe osteoarthritis involves the left hip.  The patient is status post right total hip arthroplasty.  IMPRESSION:  1.  No  evidence for nephrolithiasis or hydronephrosis. 2.  Beam hardening artifact from right hip arthroplasty device largely obscures assessment of the urinary bladder. 3.  Patchy densities within the left lung base are likely post inflammatory or infectious in etiology.   Original Report Authenticated By: Signa Kell, M.D.  Fup with ortho and pcp discussed. DC  Enid Skeens, MD 06/10/13 [phone]

## 2013-06-10 NOTE — ED Notes (Signed)
Pt c/o left sided flank pain that radiates into left lower quadrant, started last week on Monday. sts he was just here for the same thing, the pain hasn't gotten any better. Did make a follow up appt but its not until the 27th. Reports they never figured out what was causing the pain, sts they were told it could be muscular. Denies anything making it better/worse. Denies getting prescription for pain meds at d/c. sts he has been taking ibuprofen, this morning he took 3. Pt in nad, skin warm and dry, resp e/u.

## 2013-06-10 NOTE — ED Notes (Signed)
Pt aware of need for urine sample. Given urinal.

## 2013-08-04 ENCOUNTER — Other Ambulatory Visit: Payer: Self-pay | Admitting: Orthopaedic Surgery

## 2013-08-12 ENCOUNTER — Encounter (HOSPITAL_COMMUNITY): Payer: Self-pay

## 2013-08-12 ENCOUNTER — Encounter (HOSPITAL_COMMUNITY)
Admission: RE | Admit: 2013-08-12 | Discharge: 2013-08-12 | Disposition: A | Discharge status: Home / Self Care | Payer: Medicare Other | Source: Ambulatory Visit | Attending: Orthopaedic Surgery | Admitting: Orthopaedic Surgery

## 2013-08-12 DIAGNOSIS — Z01812 Encounter for preprocedural laboratory examination: Secondary | ICD-10-CM | POA: Insufficient documentation

## 2013-08-12 LAB — TYPE AND SCREEN
ABO/RH(D): A POS
Antibody Screen: NEGATIVE

## 2013-08-12 LAB — PROTIME-INR: Prothrombin Time: 13.3 seconds (ref 11.6–15.2)

## 2013-08-12 LAB — BASIC METABOLIC PANEL
BUN: 19 mg/dL (ref 6–23)
Calcium: 9.7 mg/dL (ref 8.4–10.5)
Creatinine, Ser: 1.2 mg/dL (ref 0.50–1.35)
GFR calc Af Amer: 73 mL/min — ABNORMAL LOW (ref >90)
GFR calc non Af Amer: 63 mL/min — ABNORMAL LOW (ref >90)

## 2013-08-12 LAB — CBC WITH DIFFERENTIAL/PLATELET
Basophils Absolute: 0 10*3/uL (ref 0.0–0.1)
Basophils Relative: 0 % (ref 0–1)
Eosinophils Absolute: 0.2 10*3/uL (ref 0.0–0.7)
Eosinophils Relative: 2 % (ref 0–5)
HCT: 43.3 % (ref 39.0–52.0)
Lymphocytes Relative: 20 % (ref 12–46)
MCHC: 32.1 g/dL (ref 30.0–36.0)
MCV: 82.8 fL (ref 78.0–100.0)
Monocytes Absolute: 0.6 10*3/uL (ref 0.1–1.0)
Neutro Abs: 6.3 10*3/uL (ref 1.7–7.7)
Neutrophils Relative %: 71 % (ref 43–77)
Platelets: 234 10*3/uL (ref 150–400)
RDW: 14.7 % (ref 11.5–15.5)
WBC: 8.9 10*3/uL (ref 4.0–10.5)

## 2013-08-12 LAB — URINALYSIS, ROUTINE W REFLEX MICROSCOPIC
Bilirubin Urine: NEGATIVE
Glucose, UA: NEGATIVE mg/dL
Hgb urine dipstick: NEGATIVE
Leukocytes, UA: NEGATIVE
Specific Gravity, Urine: 1.025 (ref 1.005–1.030)
pH: 5.5 (ref 5.0–8.0)

## 2013-08-12 LAB — ABO/RH: ABO/RH(D): A POS

## 2013-08-12 LAB — SURGICAL PCR SCREEN
MRSA, PCR: NEGATIVE
Staphylococcus aureus: NEGATIVE

## 2013-08-12 LAB — APTT: aPTT: 26 seconds (ref 24–37)

## 2013-08-12 NOTE — Progress Notes (Signed)
Primary: Evan Brock at [facility] in [place], [place]  Faxed over sleep apnea stop bang screening tool. Pt. Does not have a cardiologist.

## 2013-08-12 NOTE — Pre-Procedure Instructions (Signed)
Evan Brock  08/12/2013   Your procedure is scheduled on:  Tuesday, October 28  Report to [facility] Main Entrance "A" at 5:30AM.  Call this number if you have problems the morning of surgery: [phone]   Remember:   Do not eat food or drink liquids after midnight.   Take these medicines the morning of surgery with A SIP OF WATER: albuterol (bring inhaler), atenolol  Do not wear jewelry, make-up or nail polish.  Do not wear lotions, powders, or perfumes. You may wear deodorant.  Do not shave 48 hours prior to surgery. Men may shave face and neck.  Do not bring valuables to the hospital.  [facility] is not responsible                  for any belongings or valuables.               Contacts, dentures or bridgework may not be worn into surgery.  Leave suitcase in the car. After surgery it may be brought to your room.  For patients admitted to the hospital, discharge time is determined by your                treatment team.               Patients discharged the day of surgery will not be allowed to drive  home.  Name and phone number of your driver:   Special Instructions: Shower using CHG 2 nights before surgery and the night before surgery.  If you shower the day of surgery use CHG.  Use special wash - you have one bottle of CHG for all showers.  You should use approximately 1/3 of the bottle for each shower.   Please read over the following fact sheets that you were given: Pain Booklet, Coughing and Deep Breathing, Blood Transfusion Information, Total Joint Packet, MRSA Information and Surgical Site Infection Prevention

## 2013-08-17 NOTE — H&P (Signed)
TOTAL HIP ADMISSION H&P  Patient is admitted for left total hip arthroplasty.  Subjective:  Chief Complaint: left hip pain  HPI: Evan Brock, 62 y.o. male, has a history of pain and functional disability in the left hip(s) due to arthritis and patient has failed non-surgical conservative treatments for greater than 12 weeks to include NSAID's and/or analgesics, flexibility and strengthening excercises, supervised PT with diminished ADL's post treatment, use of assistive devices, weight reduction as appropriate and activity modification.  Onset of symptoms was gradual starting 4 years ago with gradually worsening course since that time.The patient noted no past surgery on the left hip(s).  Patient currently rates pain in the left hip at 9 out of 10 with activity. Patient has night pain, worsening of pain with activity and weight bearing, trendelenberg gait, pain that interfers with activities of daily living and pain with passive range of motion. Patient has evidence of subchondral sclerosis, periarticular osteophytes and joint space narrowing by imaging studies. This condition presents safety issues increasing the risk of falls. This patient has had no surg..  There is no current active infection.  There are no active problems to display for this patient.  Past Medical History  Diagnosis Date  . Hypertension   . Asthma   . Shortness of breath     with exertion    Past Surgical History  Procedure Laterality Date  . Total hip arthroplasty      No prescriptions prior to admission   No Known Allergies  History  Substance Use Topics  . Smoking status: Former Smoker -- 0.50 packs/day for 2 years    Types: Cigarettes  . Smokeless tobacco: Not on file  . Alcohol Use: Yes     Comment: social    No family history on file.   Review of Systems  All other systems reviewed and are negative.    Objective:  Physical Exam  Constitutional: He appears well-nourished.  HENT:  Head:  Atraumatic.  Eyes: Pupils are equal, round, and reactive to light.  Neck: Normal range of motion.  Cardiovascular: Normal rate and regular rhythm.   Respiratory: Breath sounds normal.  GI: Soft.  Musculoskeletal:  l hip severe pain with rotation. 10 degree contracture  Neurological: He is alert.  Skin: Skin is warm.  Psychiatric: He has a normal mood and affect.    Vital signs in last 24 hours:    Labs:   Estimated body mass index is 28.78 kg/(m^2) as calculated from the following:   Height as of 10/05/11: 5\' 9"  (1.753 m).   Weight as of 10/05/11: 88.451 kg (195 lb).   Imaging Review Plain radiographs demonstrate severe degenerative joint disease of the left hip(s). The bone quality appears to be good for age and reported activity level.  Assessment/Plan:  End stage arthritis, left hip(s)  The patient history, physical examination, clinical judgement of the provider and imaging studies are consistent with end stage degenerative joint disease of the left hip(s) and total hip arthroplasty is deemed medically necessary. The treatment options including medical management, injection therapy, arthroscopy and arthroplasty were discussed at length. The risks and benefits of total hip arthroplasty were presented and reviewed. The risks due to aseptic loosening, infection, stiffness, dislocation/subluxation,  thromboembolic complications and other imponderables were discussed.  The patient acknowledged the explanation, agreed to proceed with the plan and consent was signed. Patient is being admitted for inpatient treatment for surgery, pain control, PT, OT, prophylactic antibiotics, VTE prophylaxis, progressive ambulation and ADL's and  discharge planning.The patient is planning to be discharged home with home health services

## 2013-08-18 MED ORDER — CHLORHEXIDINE GLUCONATE 4 % EX LIQD: 60.0000 mL | Freq: Once | CUTANEOUS | Status: DC

## 2013-08-18 MED ORDER — CEFAZOLIN SODIUM-DEXTROSE 2-3 GM-% IV SOLR
2.0000 g | INTRAVENOUS | Status: AC
  Administered 2013-08-19: 2 g via INTRAVENOUS
  Filled 2013-08-18: qty 50

## 2013-08-19 ENCOUNTER — Inpatient Hospital Stay (HOSPITAL_COMMUNITY)
Admission: RE | Admit: 2013-08-19 | Discharge: 2013-08-21 | DRG: 470 | Disposition: A | Discharge status: Home Health | Payer: Medicare Other | Source: Ambulatory Visit | Attending: Orthopaedic Surgery | Admitting: Orthopaedic Surgery

## 2013-08-19 ENCOUNTER — Inpatient Hospital Stay (HOSPITAL_COMMUNITY): Payer: Medicare Other | Admitting: Certified Registered Nurse Anesthetist

## 2013-08-19 ENCOUNTER — Encounter (HOSPITAL_COMMUNITY): Payer: Self-pay | Admitting: *Deleted

## 2013-08-19 ENCOUNTER — Inpatient Hospital Stay (HOSPITAL_COMMUNITY): Payer: Medicare Other

## 2013-08-19 ENCOUNTER — Encounter (HOSPITAL_COMMUNITY)
Admission: RE | Disposition: A | Discharge status: Home Health | Payer: Self-pay | Source: Ambulatory Visit | Attending: Orthopaedic Surgery

## 2013-08-19 ENCOUNTER — Encounter (HOSPITAL_COMMUNITY): Payer: Medicare Other | Admitting: Certified Registered Nurse Anesthetist

## 2013-08-19 DIAGNOSIS — Z79899 Other long term (current) drug therapy: Secondary | ICD-10-CM

## 2013-08-19 DIAGNOSIS — I10 Essential (primary) hypertension: Secondary | ICD-10-CM | POA: Diagnosis present

## 2013-08-19 DIAGNOSIS — M1610 Unilateral primary osteoarthritis, unspecified hip: Principal | ICD-10-CM | POA: Diagnosis present

## 2013-08-19 DIAGNOSIS — Z87891 Personal history of nicotine dependence: Secondary | ICD-10-CM

## 2013-08-19 DIAGNOSIS — Z7982 Long term (current) use of aspirin: Secondary | ICD-10-CM

## 2013-08-19 DIAGNOSIS — M169 Osteoarthritis of hip, unspecified: Principal | ICD-10-CM

## 2013-08-19 DIAGNOSIS — J45909 Unspecified asthma, uncomplicated: Secondary | ICD-10-CM | POA: Diagnosis present

## 2013-08-19 HISTORY — PX: TOTAL HIP ARTHROPLASTY: SHX124

## 2013-08-19 HISTORY — DX: Unspecified osteoarthritis, unspecified site: M19.90

## 2013-08-19 HISTORY — DX: Shortness of breath: R06.02

## 2013-08-19 SURGERY — ARTHROPLASTY, HIP, TOTAL, ANTERIOR APPROACH
Anesthesia: General | Site: Hip | Laterality: Left | Wound class: Clean

## 2013-08-19 MED ORDER — ACETAMINOPHEN 650 MG RE SUPP: 650.0000 mg | Freq: Four times a day (QID) | RECTAL | Status: DC | PRN

## 2013-08-19 MED ORDER — GLYCOPYRROLATE 0.2 MG/ML IJ SOLN
INTRAMUSCULAR | Status: DC | PRN
  Administered 2013-08-19: .8 mg via INTRAVENOUS

## 2013-08-19 MED ORDER — OXYCODONE HCL 5 MG PO TABS
ORAL_TABLET | ORAL | Status: AC
  Administered 2013-08-19: 10 mg via ORAL
  Filled 2013-08-19: qty 2

## 2013-08-19 MED ORDER — DOCUSATE SODIUM 100 MG PO CAPS
100.0000 mg | ORAL_CAPSULE | Freq: Two times a day (BID) | ORAL | Status: DC
  Administered 2013-08-19 – 2013-08-21 (×4): 100 mg via ORAL
  Filled 2013-08-19 (×5): qty 1

## 2013-08-19 MED ORDER — ACETAMINOPHEN 325 MG PO TABS
ORAL_TABLET | ORAL | Status: AC
  Filled 2013-08-19: qty 2

## 2013-08-19 MED ORDER — EPHEDRINE SULFATE 50 MG/ML IJ SOLN
INTRAMUSCULAR | Status: DC | PRN
  Administered 2013-08-19 (×4): 5 mg via INTRAVENOUS

## 2013-08-19 MED ORDER — NEOSTIGMINE METHYLSULFATE 1 MG/ML IJ SOLN
INTRAMUSCULAR | Status: DC | PRN
  Administered 2013-08-19: 5 mg via INTRAVENOUS

## 2013-08-19 MED ORDER — DIPHENHYDRAMINE HCL 12.5 MG/5ML PO ELIX
12.5000 mg | ORAL_SOLUTION | ORAL | Status: DC | PRN
  Filled 2013-08-19 (×2): qty 10

## 2013-08-19 MED ORDER — MENTHOL 3 MG MT LOZG: 1.0000 | LOZENGE | OROMUCOSAL | Status: DC | PRN

## 2013-08-19 MED ORDER — ACETAMINOPHEN 325 MG PO TABS
650.0000 mg | ORAL_TABLET | Freq: Four times a day (QID) | ORAL | Status: DC | PRN
  Administered 2013-08-19: 650 mg via ORAL

## 2013-08-19 MED ORDER — LACTATED RINGERS IV SOLN: INTRAVENOUS | Status: DC

## 2013-08-19 MED ORDER — HYDROMORPHONE HCL PF 1 MG/ML IJ SOLN
INTRAMUSCULAR | Status: AC
  Administered 2013-08-19: 0.5 mg via INTRAVENOUS
  Filled 2013-08-19: qty 1

## 2013-08-19 MED ORDER — METHOCARBAMOL 500 MG PO TABS
500.0000 mg | ORAL_TABLET | Freq: Four times a day (QID) | ORAL | Status: DC | PRN
  Administered 2013-08-19 – 2013-08-21 (×6): 500 mg via ORAL
  Filled 2013-08-19 (×6): qty 1

## 2013-08-19 MED ORDER — HYDROMORPHONE HCL PF 1 MG/ML IJ SOLN
0.2500 mg | INTRAMUSCULAR | Status: DC | PRN
  Administered 2013-08-19 (×4): 0.5 mg via INTRAVENOUS

## 2013-08-19 MED ORDER — MIDAZOLAM HCL 5 MG/5ML IJ SOLN
INTRAMUSCULAR | Status: DC | PRN
  Administered 2013-08-19: 2 mg via INTRAVENOUS

## 2013-08-19 MED ORDER — TRANEXAMIC ACID 100 MG/ML IV SOLN
1000.0000 mg | INTRAVENOUS | Status: AC
  Administered 2013-08-19: 1000 mg via INTRAVENOUS
  Filled 2013-08-19: qty 10

## 2013-08-19 MED ORDER — CEFAZOLIN SODIUM-DEXTROSE 2-3 GM-% IV SOLR
2.0000 g | Freq: Four times a day (QID) | INTRAVENOUS | Status: AC
  Administered 2013-08-19 (×2): 2 g via INTRAVENOUS
  Filled 2013-08-19 (×3): qty 50

## 2013-08-19 MED ORDER — 0.9 % SODIUM CHLORIDE (POUR BTL) OPTIME
TOPICAL | Status: DC | PRN
  Administered 2013-08-19: 1000 mL

## 2013-08-19 MED ORDER — TEMAZEPAM 15 MG PO CAPS
15.0000 mg | ORAL_CAPSULE | Freq: Every evening | ORAL | Status: DC | PRN
  Administered 2013-08-19: 15 mg via ORAL
  Filled 2013-08-19: qty 1

## 2013-08-19 MED ORDER — ONDANSETRON HCL 4 MG/2ML IJ SOLN
INTRAMUSCULAR | Status: DC | PRN
  Administered 2013-08-19: 4 mg via INTRAVENOUS

## 2013-08-19 MED ORDER — OXYCODONE HCL 5 MG PO TABS
5.0000 mg | ORAL_TABLET | ORAL | Status: DC | PRN
Start: 2013-08-19 — End: 2013-08-21
  Administered 2013-08-19 – 2013-08-21 (×14): 10 mg via ORAL
  Filled 2013-08-19 (×13): qty 2

## 2013-08-19 MED ORDER — ASPIRIN EC 325 MG PO TBEC
325.0000 mg | DELAYED_RELEASE_TABLET | Freq: Two times a day (BID) | ORAL | Status: DC
  Administered 2013-08-19 – 2013-08-21 (×4): 325 mg via ORAL
  Filled 2013-08-19 (×6): qty 1

## 2013-08-19 MED ORDER — PROPOFOL 10 MG/ML IV BOLUS
INTRAVENOUS | Status: DC | PRN
  Administered 2013-08-19: 200 mg via INTRAVENOUS

## 2013-08-19 MED ORDER — METOCLOPRAMIDE HCL 10 MG PO TABS: 5.0000 mg | ORAL_TABLET | Freq: Three times a day (TID) | ORAL | Status: DC | PRN

## 2013-08-19 MED ORDER — FERROUS SULFATE 325 (65 FE) MG PO TABS
325.0000 mg | ORAL_TABLET | Freq: Three times a day (TID) | ORAL | Status: DC
  Administered 2013-08-19 – 2013-08-21 (×4): 325 mg via ORAL
  Filled 2013-08-19 (×9): qty 1

## 2013-08-19 MED ORDER — METOCLOPRAMIDE HCL 5 MG/ML IJ SOLN: 5.0000 mg | Freq: Three times a day (TID) | INTRAMUSCULAR | Status: DC | PRN

## 2013-08-19 MED ORDER — ROCURONIUM BROMIDE 100 MG/10ML IV SOLN
INTRAVENOUS | Status: DC | PRN
  Administered 2013-08-19: 50 mg via INTRAVENOUS
  Administered 2013-08-19 (×2): 10 mg via INTRAVENOUS

## 2013-08-19 MED ORDER — ATENOLOL 50 MG PO TABS
50.0000 mg | ORAL_TABLET | Freq: Every morning | ORAL | Status: DC
  Administered 2013-08-20 – 2013-08-21 (×2): 50 mg via ORAL
  Filled 2013-08-19 (×2): qty 1

## 2013-08-19 MED ORDER — ALBUTEROL SULFATE HFA 108 (90 BASE) MCG/ACT IN AERS
2.0000 | INHALATION_SPRAY | RESPIRATORY_TRACT | Status: DC | PRN
  Administered 2013-08-19: 2 via RESPIRATORY_TRACT
  Filled 2013-08-19: qty 6.7

## 2013-08-19 MED ORDER — PHENOL 1.4 % MT LIQD: 1.0000 | OROMUCOSAL | Status: DC | PRN

## 2013-08-19 MED ORDER — PROMETHAZINE HCL 25 MG/ML IJ SOLN: 6.2500 mg | INTRAMUSCULAR | Status: DC | PRN

## 2013-08-19 MED ORDER — HYDROCHLOROTHIAZIDE 25 MG PO TABS
25.0000 mg | ORAL_TABLET | Freq: Every morning | ORAL | Status: DC
  Administered 2013-08-20 – 2013-08-21 (×2): 25 mg via ORAL
  Filled 2013-08-19 (×2): qty 1

## 2013-08-19 MED ORDER — FENTANYL CITRATE 0.05 MG/ML IJ SOLN
INTRAMUSCULAR | Status: DC | PRN
  Administered 2013-08-19: 100 ug via INTRAVENOUS
  Administered 2013-08-19 (×2): 50 ug via INTRAVENOUS

## 2013-08-19 MED ORDER — OXYCODONE HCL 5 MG PO TABS: 5.0000 mg | ORAL_TABLET | Freq: Once | ORAL | Status: DC | PRN

## 2013-08-19 MED ORDER — ONDANSETRON HCL 4 MG/2ML IJ SOLN: 4.0000 mg | Freq: Four times a day (QID) | INTRAMUSCULAR | Status: DC | PRN

## 2013-08-19 MED ORDER — METHOCARBAMOL 500 MG PO TABS
ORAL_TABLET | ORAL | Status: AC
  Filled 2013-08-19: qty 1

## 2013-08-19 MED ORDER — MORPHINE SULFATE 2 MG/ML IJ SOLN
2.0000 mg | INTRAMUSCULAR | Status: DC | PRN
  Administered 2013-08-19 – 2013-08-20 (×4): 2 mg via INTRAVENOUS
  Filled 2013-08-19 (×5): qty 1

## 2013-08-19 MED ORDER — LIDOCAINE HCL (CARDIAC) 20 MG/ML IV SOLN
INTRAVENOUS | Status: DC | PRN
  Administered 2013-08-19: 80 mg via INTRAVENOUS

## 2013-08-19 MED ORDER — METHOCARBAMOL 100 MG/ML IJ SOLN
500.0000 mg | Freq: Four times a day (QID) | INTRAMUSCULAR | Status: DC | PRN
  Filled 2013-08-19: qty 5

## 2013-08-19 MED ORDER — LACTATED RINGERS IV SOLN
INTRAVENOUS | Status: DC | PRN
  Administered 2013-08-19 (×2): via INTRAVENOUS

## 2013-08-19 MED ORDER — OXYCODONE HCL 5 MG/5ML PO SOLN: 5.0000 mg | Freq: Once | ORAL | Status: DC | PRN

## 2013-08-19 MED ORDER — DEXTROSE IN LACTATED RINGERS 5 % IV SOLN
INTRAVENOUS | Status: DC
  Administered 2013-08-19: 1 mL via INTRAVENOUS
  Administered 2013-08-20: 04:00:00 via INTRAVENOUS

## 2013-08-19 MED ORDER — ONDANSETRON HCL 4 MG PO TABS
4.0000 mg | ORAL_TABLET | Freq: Four times a day (QID) | ORAL | Status: DC | PRN
  Administered 2013-08-19: 4 mg via ORAL
  Filled 2013-08-19: qty 1

## 2013-08-19 SURGICAL SUPPLY — 47 items
BLADE SAW SGTL 18X1.27X75 (BLADE) ×2 IMPLANT
BLADE SURG ROTATE 9660 (MISCELLANEOUS) IMPLANT
CANISTER SUCTION 2500CC (MISCELLANEOUS) ×2 IMPLANT
CAPT HIP PF COP ×2 IMPLANT
CELLS DAT CNTRL 66122 CELL SVR (MISCELLANEOUS) ×1 IMPLANT
COVER SURGICAL LIGHT HANDLE (MISCELLANEOUS) ×2 IMPLANT
DRAPE C-ARM 42X72 X-RAY (DRAPES) ×2 IMPLANT
DRAPE STERI IOBAN 125X83 (DRAPES) ×2 IMPLANT
DRAPE U-SHAPE 47X51 STRL (DRAPES) ×6 IMPLANT
DRSG AQUACEL AG ADV 3.5X10 (GAUZE/BANDAGES/DRESSINGS) ×2 IMPLANT
DURAPREP 26ML APPLICATOR (WOUND CARE) ×2 IMPLANT
ELECT BLADE 4.0 EZ CLEAN MEGAD (MISCELLANEOUS)
ELECT BLADE TIP CTD 4 INCH (ELECTRODE) ×2 IMPLANT
ELECT CAUTERY BLADE 6.4 (BLADE) ×2 IMPLANT
ELECT REM PT RETURN 9FT ADLT (ELECTROSURGICAL) ×2
ELECTRODE BLDE 4.0 EZ CLN MEGD (MISCELLANEOUS) IMPLANT
ELECTRODE REM PT RTRN 9FT ADLT (ELECTROSURGICAL) ×1 IMPLANT
FACESHIELD LNG OPTICON STERILE (SAFETY) ×6 IMPLANT
GLOVE BIO SURGEON STRL SZ8 (GLOVE) ×2 IMPLANT
GLOVE BIO SURGEON STRL SZ8.5 (GLOVE) IMPLANT
GLOVE BIOGEL PI IND STRL 8 (GLOVE) ×1 IMPLANT
GLOVE BIOGEL PI IND STRL 8.5 (GLOVE) IMPLANT
GLOVE BIOGEL PI INDICATOR 8 (GLOVE) ×1
GLOVE BIOGEL PI INDICATOR 8.5 (GLOVE)
GOWN PREVENTION PLUS LG XLONG (DISPOSABLE) IMPLANT
GOWN STRL NON-REIN LRG LVL3 (GOWN DISPOSABLE) ×4 IMPLANT
GOWN STRL REIN XL XLG (GOWN DISPOSABLE) ×2 IMPLANT
KIT BASIN OR (CUSTOM PROCEDURE TRAY) ×2 IMPLANT
KIT ROOM TURNOVER OR (KITS) ×2 IMPLANT
MANIFOLD NEPTUNE II (INSTRUMENTS) IMPLANT
NS IRRIG 1000ML POUR BTL (IV SOLUTION) ×2 IMPLANT
PACK TOTAL JOINT (CUSTOM PROCEDURE TRAY) ×2 IMPLANT
PAD ARMBOARD 7.5X6 YLW CONV (MISCELLANEOUS) ×4 IMPLANT
RTRCTR WOUND ALEXIS 18CM MED (MISCELLANEOUS) ×2
STAPLER VISISTAT 35W (STAPLE) ×2 IMPLANT
SUT ETHIBOND NAB CT1 #1 30IN (SUTURE) ×4 IMPLANT
SUT VIC AB 0 CT1 27 (SUTURE)
SUT VIC AB 0 CT1 27XBRD ANBCTR (SUTURE) IMPLANT
SUT VIC AB 1 CT1 27 (SUTURE) ×1
SUT VIC AB 1 CT1 27XBRD ANBCTR (SUTURE) ×1 IMPLANT
SUT VIC AB 2-0 CT1 27 (SUTURE) ×1
SUT VIC AB 2-0 CT1 TAPERPNT 27 (SUTURE) ×1 IMPLANT
SUT VLOC 180 0 24IN GS25 (SUTURE) ×2 IMPLANT
TOWEL OR 17X24 6PK STRL BLUE (TOWEL DISPOSABLE) ×2 IMPLANT
TOWEL OR 17X26 10 PK STRL BLUE (TOWEL DISPOSABLE) ×4 IMPLANT
TRAY FOLEY CATH 14FR (SET/KITS/TRAYS/PACK) IMPLANT
WATER STERILE IRR 1000ML POUR (IV SOLUTION) ×2 IMPLANT

## 2013-08-19 NOTE — Progress Notes (Signed)
Orthopedic Tech Progress Note Patient Details:  Evan Brock [birth year] [id number]  Ortho Devices Ortho Device/Splint Location: put overhead frame on bed Ortho Device/Splint Interventions: Ordered;Application   Jennye Moccasin 08/19/2013, 6:23 PM

## 2013-08-19 NOTE — Op Note (Signed)
PRE-OP DIAGNOSIS:  LEFT HIP DEGENERATIVE JOINT DISEASE POST-OP DIAGNOSIS: same PROCEDURE:  LEFT TOTAL HIP ARTHROPLASTY ANTERIOR APPROACH ANESTHESIA:  General SURGEON:  Marcene Corning MD ASSISTANT:  April Green RNFA   INDICATIONS FOR PROCEDURE:  The patient is a 62 y.o. male with a long history of a painful hip.  This has persisted despite multiple conservative measures.  The patient has persisted with pain and dysfunction making rest and activity difficult.  A total hip replacement is offered as surgical treatment.  Informed operative consent was obtained after discussion of possible complications including reaction to anesthesia, infection, neurovascular injury, dislocation, DVT, PE, and death.  The importance of the postoperative rehab program to optimize result was stressed with the patient.  SUMMARY OF FINDINGS AND PROCEDURE:  Under general anesthesia through a anterior approach an the Hana table a right THR was performed.  The patient had severe degenerative change and excellent bone quality.  We used DePuy components to replace the hip and these were size KA 13 Corail femur capped with a +5 36 mm ceramic hip ball.  On the acetabular side we used a size 52 Gription shell with a plus 4 neutral polyethylene liner.  We did use a hole eliminator.  Bryna Colander assisted throughout and was invaluable to the completion of the case in that he helped position and retract while I performed the procedure.  He also closed simultaneously to help minimize OR time.  I used fluoroscopy throughout the case to check position of components and leg lengths and read all these views myself.  DESCRIPTION OF PROCEDURE:  The patient was taken to the OR suite where general anesthetic was applied.  The patient was then positioned on the Hana table supine.  All bony prominences were appropriately padded.  Prep and drape was then performed in normal sterile fashion.  The patient was given Kefzol preoperative antibiotic and an  appropriate time out was performed.  We then took an anterior approach to the right hip.  Dissection was taken through adipose to the tensor fascia lata fascia.  This structure was incised longitudinally and we dissected in the intermuscular interval just medial to this muscle.  Cobra retractors were placed superior and inferior to the femoral neck superficial to the capsule.  A capsular incision was then made and the retractors were placed along the femoral neck.  Xray was brought in to get a good level for the femoral neck cut which was made with an oscillating saw and osteotome.  The femoral head was removed with a corkscrew.  The acetabulum was exposed and some labral tissues were excised. Reaming was taken to the inside wall of the pelvis and sequentially up to 1 mm smaller than the actual component.  A trial of components was done and then the aforementioned acetabular shell was placed in appropriate tilt and anteversion confirmed by fluoroscopy. The liner was placed along with the hole eliminator and attention was turned to the femur.  The leg was brought down and over into adduction and the elevator bar was used to raise the femur up gently in the wound.  The piriformis was released with care taken to preserve the obturator internus attachment and all of the posterior capsule. The femur was reamed and then broached to the appropriate size.  A trial reduction was done and the aforementioned head and neck assembly gave [place] the best stability in extension with external rotation.  Leg lengths were felt to be about equal by fluoroscopic exam.  The  trial components were removed and the wound irrigated.  We then placed the femoral component in appropriate anteversion.  The head was applied to a dry stem neck and the hip again reduced.  It was again stable in the aforementioned position.  The would was irrigated again followed by re-approximation of anterior capsule with ethibond suture. Tensor fascia was repaired  with V-loc suture  followed by subcutaneous closure with #O and #2 undyed vicryl.  Skin was closed with staples followed by a sterile dressing.  EBL and IOF can be obtained from anesthesia records.  DISPOSITION:  The patient was extubated in the OR and taken to PACU in stable condition to be admitted to the Orthopedic Surgery for appropriate post-op care to include perioperative antibiotics and DVT prophylaxis.

## 2013-08-19 NOTE — Plan of Care (Signed)
Problem: Consults Goal: Diagnosis- Total Joint Replacement Primary Total Hip left anterior

## 2013-08-19 NOTE — Progress Notes (Signed)
UR COMPLETED  

## 2013-08-19 NOTE — Evaluation (Signed)
Physical Therapy Evaluation Patient Details Name: Evan Brock MRN: [id number] DOB: [birth year] Today's Date: 08/19/2013 Time: 1420-1440 PT Time Calculation (min): 20 min  PT Assessment / Plan / Recommendation History of Present Illness  s/p LTHA direct anterior approach  Clinical Impression  Patient is s/p above surgery resulting in functional limitations due to the deficits listed below (see PT Problem List).  Patient will benefit from skilled PT to increase their independence and safety with mobility to allow discharge to the venue listed below.       PT Assessment  Patient needs continued PT services    Follow Up Recommendations  Home health PT;Supervision - Intermittent    Does the patient have the potential to tolerate intense rehabilitation      Barriers to Discharge        Equipment Recommendations  Rolling walker with 5" wheels;3in1 (PT)    Recommendations for Other Services     Frequency 7X/week    Precautions / Restrictions Precautions Precautions: None Restrictions Weight Bearing Restrictions: Yes LLE Weight Bearing: Weight bearing as tolerated   Pertinent Vitals/Pain 8/10 L hip pain; patient repositioned for comfort       Mobility  Bed Mobility Bed Mobility: Supine to Sit;Sitting - Scoot to Edge of Bed Supine to Sit: 3: Mod assist;HOB elevated Sitting - Scoot to Edge of Bed: 4: Min guard Details for Bed Mobility Assistance: cues for technique; physical assist to lift to sitting position Transfers Transfers: Sit to Stand;Stand to Sit Sit to Stand: 4: Min assist;From bed;With upper extremity assist Stand to Sit: 4: Min assist;To chair/3-in-1;With upper extremity assist Details for Transfer Assistance: Cues for hand placement and safety Ambulation/Gait Ambulation/Gait Assistance: 4: Min guard Ambulation Distance (Feet): 50 Feet Assistive device: Rolling walker Ambulation/Gait Assistance Details: verbal and demo cues for gait sequence and  posture Gait Pattern: Step-to pattern Gait velocity: slowed    Exercises     PT Diagnosis: Difficulty walking;Acute pain  PT Problem List: Decreased strength;Decreased range of motion;Decreased activity tolerance;Decreased balance;Decreased mobility;Decreased knowledge of use of DME;Pain PT Treatment Interventions: DME instruction;Gait training;Functional mobility training;Therapeutic activities;Therapeutic exercise;Balance training;Patient/family education     PT Goals(Current goals can be found in the care plan section) Acute Rehab PT Goals Patient Stated Goal: do what he wants to do without pain PT Goal Formulation: With patient Time For Goal Achievement: 08/26/13 Potential to Achieve Goals: Good  Visit Information  Last PT Received On: 08/19/13 Assistance Needed: +1 History of Present Illness: s/p LTHA direct anterior approach       Prior Functioning  Home Living Family/patient expects to be discharged to:: Private residence Living Arrangements: Spouse/significant other Available Help at Discharge: Family;Available PRN/intermittently Type of Home: House Home Access: Level entry (at back door) Home Layout: One level Home Equipment: Cane - single point Prior Function Level of Independence: Independent Communication Communication: No difficulties    Cognition  Cognition Arousal/Alertness: Awake/alert Behavior During Therapy: WFL for tasks assessed/performed Overall Cognitive Status: Within Functional Limits for tasks assessed    Extremity/Trunk Assessment Upper Extremity Assessment Upper Extremity Assessment: Overall WFL for tasks assessed Lower Extremity Assessment Lower Extremity Assessment: LLE deficits/detail LLE Deficits / Details: Grossly decr AROM and strength, limited by pain postop Cervical / Trunk Assessment Cervical / Trunk Assessment: Normal   Balance    End of Session PT - End of Session Equipment Utilized During Treatment: Gait belt Activity  Tolerance: Patient tolerated treatment well Patient left: in chair;with call bell/phone within reach;with family/visitor present Nurse Communication: Mobility status  GP     Van Clines [place], [place] [phone]  08/19/2013, 4:19 PM

## 2013-08-19 NOTE — Interval H&P Note (Signed)
History and Physical Interval Note:  08/19/2013 7:29 AM  Evan Brock  has presented today for surgery, with the diagnosis of LEFT HIP DEGENERATIVE JOINT DISEASE  The various methods of treatment have been discussed with the patient and family. After consideration of risks, benefits and other options for treatment, the patient has consented to  Procedure(s): TOTAL HIP ARTHROPLASTY ANTERIOR APPROACH (Left) as a surgical intervention .  The patient's history has been reviewed, patient examined, no change in status, stable for surgery.  I have reviewed the patient's chart and labs.  Questions were answered to the patient's satisfaction.     Talmage Teaster G

## 2013-08-19 NOTE — Anesthesia Procedure Notes (Signed)
Procedure Name: Intubation Date/Time: 08/19/2013 7:41 AM Performed by: Velna Ochs Pre-anesthesia Checklist: Patient identified, Emergency Drugs available, Suction available and Patient being monitored Patient Re-evaluated:Patient Re-evaluated prior to inductionOxygen Delivery Method: Circle system utilized Preoxygenation: Pre-oxygenation with 100% oxygen Intubation Type: IV induction Ventilation: Mask ventilation without difficulty and Oral airway inserted - appropriate to patient size Laryngoscope Size: Miller and 3 Grade View: Grade II Tube type: Oral Tube size: 7.5 mm Number of attempts: 1 Airway Equipment and Method: Stylet and Oral airway Placement Confirmation: ETT inserted through vocal cords under direct vision,  positive ETCO2 and breath sounds checked- equal and bilateral Secured at: 24 cm Tube secured with: Tape Dental Injury: Teeth and Oropharynx as per pre-operative assessment

## 2013-08-19 NOTE — Progress Notes (Signed)
Pt arrived to unit via bed. Vitals: temp 98.9, bp 133/81, 62 bpm, resp 18, sat 96% on 2L O2.  Pt oriented to unit, floor and staff. All orders acknowledged and implemented. Will continue to monitor and assess.

## 2013-08-19 NOTE — Anesthesia Postprocedure Evaluation (Signed)
  Anesthesia Post-op Note  Patient: Evan Brock  Procedure(s) Performed: Procedure(s): TOTAL HIP ARTHROPLASTY ANTERIOR APPROACH (Left)  Patient Location: PACU  Anesthesia Type:General  Level of Consciousness: awake and alert   Airway and Oxygen Therapy: Patient Spontanous Breathing  Post-op Pain: mild  Post-op Assessment: Post-op Vital signs reviewed  Post-op Vital Signs: stable  Complications: No apparent anesthesia complications

## 2013-08-19 NOTE — Preoperative (Addendum)
Beta Blockers   Reason not to administer Beta Blockers:Not Applicable, patient took atenolol 08/19/13.

## 2013-08-19 NOTE — Transfer of Care (Signed)
Immediate Anesthesia Transfer of Care Note  Patient: Evan Brock  Procedure(s) Performed: Procedure(s): TOTAL HIP ARTHROPLASTY ANTERIOR APPROACH (Left)  Patient Location: PACU  Anesthesia Type:General  Level of Consciousness: awake and alert   Airway & Oxygen Therapy: Patient Spontanous Breathing and Patient connected to nasal cannula oxygen  Post-op Assessment: Report given to PACU RN, Post -op Vital signs reviewed and stable and Patient moving all extremities X 4  Post vital signs: Reviewed and stable  Complications: No apparent anesthesia complications

## 2013-08-19 NOTE — Anesthesia Preprocedure Evaluation (Addendum)
Anesthesia Evaluation  Patient identified by MRN, date of birth, ID band Patient awake    Reviewed: Allergy & Precautions, H&P , NPO status , Patient's Chart, lab work & pertinent test results, reviewed documented beta blocker date and time   Airway Mallampati: I TM Distance: >3 FB Neck ROM: Full    Dental  (+) Dental Advisory Given, Missing and Poor Dentition   Pulmonary shortness of breath, asthma ,          Cardiovascular hypertension, Pt. on medications Rhythm:Regular Rate:Normal     Neuro/Psych    GI/Hepatic   Endo/Other    Renal/GU      Musculoskeletal   Abdominal   Peds  Hematology   Anesthesia Other Findings   Reproductive/Obstetrics                         Anesthesia Physical Anesthesia Plan  ASA: II  Anesthesia Plan: General   Post-op Pain Management:    Induction: Intravenous  Airway Management Planned: Oral ETT  Additional Equipment:   Intra-op Plan:   Post-operative Plan: Extubation in OR  Informed Consent: I have reviewed the patients History and Physical, chart, labs and discussed the procedure including the risks, benefits and alternatives for the proposed anesthesia with the patient or authorized representative who has indicated his/her understanding and acceptance.   Dental advisory given  Plan Discussed with: Anesthesiologist and Surgeon  Anesthesia Plan Comments:         Anesthesia Quick Evaluation

## 2013-08-19 NOTE — Evaluation (Signed)
Occupational Therapy Evaluation Patient Details Name: Evan Brock MRN: [id number] DOB: May 18, 1951 Today's Date: 08/19/2013 Time: 4098-1191 OT Time Calculation (min): 25 min  OT Assessment / Plan / Recommendation History of present illness s/p LTHA direct anterior approach   Clinical Impression   Pt is s/p THA resulting in the deficits listed below (see OT Problem List).  Pt will benefit from skilled OT to increase their safety and independence with ADL and functional mobility for ADL to facilitate discharge to venue listed below. Patient's wife works night shift and she reports that their son can be present to provide assist/supervision PRN.      OT Assessment  Patient needs continued OT Services    Follow Up Recommendations  No OT follow up    Equipment Recommendations  3 in 1 bedside comode    Frequency  Min 2X/week    Precautions / Restrictions Precautions Precautions: None Restrictions Weight Bearing Restrictions: Yes LLE Weight Bearing: Weight bearing as tolerated   Pertinent Vitals/Pain 8/10, activity, repositioned, and rest.  Patient declined medication until after he eats supper.    ADL  Grooming: Simulated;Supervision/safety;Set up Where Assessed - Grooming: Unsupported sitting;Supported standing Upper Body Bathing: Simulated;Set up Where Assessed - Upper Body Bathing: Unsupported sitting Lower Body Bathing: Simulated;Moderate assistance Where Assessed - Lower Body Bathing: Unsupported sitting;Supported standing Upper Body Dressing: Simulated;Set up Where Assessed - Upper Body Dressing: Unsupported sitting Lower Body Dressing: Simulated;Performed;Moderate assistance Where Assessed - Lower Body Dressing: Supported standing;Unsupported sitting Toilet Transfer: Performed;Min guard Statistician Method: Sit to stand;Stand pivot Acupuncturist: Raised toilet seat with arms (or 3-in-1 over toilet) Toileting - Clothing Manipulation and Hygiene:  Performed;Minimal assistance Where Assessed - Engineer, mining and Hygiene: Standing;Sit on 3-in-1 or toilet Equipment Used: Rolling walker ADL Comments: had LTHR ~4 yrs ago so he knows the drill.  Did remarkably well to have had his surgery this morning!  Wife present and reports that when she is at work (night shift) her son will be available PRN    OT Diagnosis: Generalized weakness;Acute pain  OT Problem List: Decreased activity tolerance;Decreased knowledge of use of DME or AE;Pain OT Treatment Interventions: Self-care/ADL training;Energy conservation;Therapeutic activities;DME and/or AE instruction;Patient/family education   OT Goals(Current goals can be found in the care plan section) Acute Rehab OT Goals Patient Stated Goal: go home and care for himself OT Goal Formulation: With patient/family Time For Goal Achievement: 09/02/13 Potential to Achieve Goals: Good  Visit Information  Last OT Received On: 08/19/13 Assistance Needed: +1 History of Present Illness: s/p LTHA direct anterior approach       Prior Functioning     Home Living Family/patient expects to be discharged to:: Private residence Living Arrangements: Spouse/significant other Available Help at Discharge: Family;Available 24 hours/day Type of Home: House Home Access: Level entry (at back door) Home Layout: One level Home Equipment: Cane - single point Prior Function Level of Independence: Independent Communication Communication: No difficulties Dominant Hand: Right    Cognition  Cognition Arousal/Alertness: Awake/alert Behavior During Therapy: WFL for tasks assessed/performed Overall Cognitive Status: Within Functional Limits for tasks assessed    Extremity/Trunk Assessment Upper Extremity Assessment Upper Extremity Assessment: Overall WFL for tasks assessed Lower Extremity Assessment Lower Extremity Assessment: Defer to PT evaluation LLE Deficits / Details: Grossly decr AROM and  strength, limited by pain postop Cervical / Trunk Assessment Cervical / Trunk Assessment: Normal     Mobility Bed Mobility Bed Mobility: Supine to Sit;Sitting - Scoot to Edge of Bed Supine to  Sit: 3: Mod assist;HOB flat Sitting - Scoot to Edge of Bed: 5: Supervision Details for Bed Mobility Assistance: cues for technique Transfers Sit to Stand: From bed;With upper extremity assist;From chair/3-in-1;4: Min guard Stand to Sit: To chair/3-in-1;With upper extremity assist;To bed;4: Min guard Details for Transfer Assistance: cues for walker safety     End of Session OT - End of Session Equipment Utilized During Treatment: Rolling walker Activity Tolerance: Patient limited by pain;Patient tolerated treatment well Patient left: in bed;with call bell/phone within reach;with family/visitor present  GO     Brunette Lavalle 08/19/2013, 4:44 PM

## 2013-08-20 ENCOUNTER — Encounter (HOSPITAL_COMMUNITY): Payer: Self-pay

## 2013-08-20 DIAGNOSIS — M169 Osteoarthritis of hip, unspecified: Secondary | ICD-10-CM | POA: Diagnosis present

## 2013-08-20 LAB — BASIC METABOLIC PANEL
BUN: 12 mg/dL (ref 6–23)
CO2: 30 mEq/L (ref 19–32)
Chloride: 96 mEq/L (ref 96–112)
Creatinine, Ser: 0.97 mg/dL (ref 0.50–1.35)
GFR calc Af Amer: >90 mL/min (ref >90)
GFR calc non Af Amer: 86 mL/min — ABNORMAL LOW (ref >90)
Sodium: 134 mEq/L — ABNORMAL LOW (ref 135–145)

## 2013-08-20 LAB — CBC
HCT: 34.7 % — ABNORMAL LOW (ref 39.0–52.0)
Platelets: 192 10*3/uL (ref 150–400)
RBC: 4.24 MIL/uL (ref 4.22–5.81)
RDW: 14.9 % (ref 11.5–15.5)
WBC: 9.1 10*3/uL (ref 4.0–10.5)

## 2013-08-20 NOTE — Progress Notes (Signed)
08/20/13 Spoke with patient about HHC, he chose [facility]. Contacted Debbie at [place] and set up HHPT. Patient states that he has a rolling walker and 3N1 at home. No other d/c needs identified. Jacquelynn Cree RN, BSN, CCM

## 2013-08-20 NOTE — Progress Notes (Signed)
Physical Therapy Treatment Patient Details Name: Evan Brock MRN: [id number] DOB: 09-19-1951 Today's Date: 08/20/2013 Time: 0865-7846 PT Time Calculation (min): 29 min  PT Assessment / Plan / Recommendation  History of Present Illness s/p LTHA direct anterior approach   PT Comments   Making steady progress, with good gains in ambulation distance, activity tolerance and approaching a more normal gait pattern   Follow Up Recommendations  Home health PT;Supervision - Intermittent     Does the patient have the potential to tolerate intense rehabilitation     Barriers to Discharge        Equipment Recommendations  Rolling walker with 5" wheels;3in1 (PT)    Recommendations for Other Services    Frequency 7X/week   Progress towards PT Goals Progress towards PT goals: Progressing toward goals  Plan Current plan remains appropriate    Precautions / Restrictions Precautions Precautions: None Restrictions Weight Bearing Restrictions: No LLE Weight Bearing: Weight bearing as tolerated   Pertinent Vitals/Pain 6/10 L hip post walk patient repositioned for comfort     Mobility  Bed Mobility Bed Mobility: Not assessed Supine to Sit: 4: Min assist;HOB flat Sitting - Scoot to Edge of Bed: 4: Min guard Sit to Supine: 4: Min guard Details for Bed Mobility Assistance: Pt needed initial assist to move the LLE off of the bed but was able to lift it with his own UE to bring it back into the bed when returning to supine.   Transfers Transfers: Sit to Stand;Stand to Sit Sit to Stand: 4: Min guard;From bed;Without upper extremity assist Stand to Sit: 4: Min guard;With upper extremity assist;To chair/3-in-1 Details for Transfer Assistance: cues for walker safety Ambulation/Gait Ambulation/Gait Assistance: 4: Min guard;5: Supervision Ambulation Distance (Feet): 100 Feet Assistive device: Rolling walker Ambulation/Gait Assistance Details: Cues to self-monitor for activity tolerance; pt  naturally progressing to step-through pattern Gait Pattern: Step-through pattern;Decreased stride length    Exercises Total Joint Exercises Hip ABduction/ADduction: AROM;Left;10 reps;Standing Marching in Standing: AROM;Both;10 reps Standing Hip Extension: AROM;Left;10 reps   PT Diagnosis:    PT Problem List:   PT Treatment Interventions:     PT Goals (current goals can now be found in the care plan section) Acute Rehab PT Goals Patient Stated Goal: go home and care for himself PT Goal Formulation: With patient Time For Goal Achievement: 08/26/13 Potential to Achieve Goals: Good  Visit Information  Last PT Received On: 08/20/13 Assistance Needed: +1 History of Present Illness: s/p LTHA direct anterior approach    Subjective Data  Subjective: Hip feels tight Patient Stated Goal: go home and care for himself   Cognition  Cognition Arousal/Alertness: Awake/alert Behavior During Therapy: WFL for tasks assessed/performed Overall Cognitive Status: Within Functional Limits for tasks assessed    Balance  Balance Balance Assessed: Yes Static Standing Balance Static Standing - Balance Support: Right upper extremity supported;Left upper extremity supported Static Standing - Level of Assistance: 5: Stand by assistance  End of Session PT - End of Session Activity Tolerance: Patient tolerated treatment well Patient left: in chair;with call bell/phone within reach Nurse Communication: Mobility status   GP     Evan Brock, [place] [phone]  08/20/2013, 4:25 PM

## 2013-08-20 NOTE — Progress Notes (Signed)
Occupational Therapy Treatment Patient Details Name: Evan Brock MRN: [id number] DOB: [birth year] Today's Date: 08/20/2013 Time: 4098-1191 OT Time Calculation (min): 23 min  OT Assessment / Plan / Recommendation  History of present illness s/p LTHA direct anterior approach   OT comments  Pt currently at a min guard assist level for toilet transfers and simulated toilet transfers.  Feel he is making steady progress and will reach modified independent level soon.  Will not need any DME as pt already has a 3:1 from previous surgery and can use this again over the toilet and in the shower.  Follow Up Recommendations  No OT follow up       Equipment Recommendations  None recommended by OT       Frequency Min 2X/week   Progress towards OT Goals Progress towards OT goals: Progressing toward goals  Plan Discharge plan remains appropriate    Precautions / Restrictions Precautions Precautions: None Restrictions Weight Bearing Restrictions: No LLE Weight Bearing: Weight bearing as tolerated   Pertinent Vitals/Pain Pt reported pain 5/10 in the left LE    ADL  Toilet Transfer: Simulated;Min guard Toilet Transfer Method: Sit to Barista: Bedside commode Toileting - Clothing Manipulation and Hygiene: Simulated;Supervision/safety Where Assessed - Engineer, mining and Hygiene: Sit to stand from 3-in-1 or toilet Tub/Shower Transfer: Simulated;Min guard Tub/Shower Transfer Method: Science writer: Walk in shower Equipment Used: Rolling walker Transfers/Ambulation Related to ADLs: Pt is able to transfer with min guard assist using the RW.   ADL Comments: Pt reports that his wife will assist with LB selfcare.  He is aware of the AE but says he will not need it.  Will use his 3:1 over the toilet and also have his wife place it in the shower as well.        Visit Information  Last OT Received On: 08/20/13 Assistance Needed:  +1 History of Present Illness: s/p LTHA direct anterior approach          Cognition  Cognition Arousal/Alertness: Awake/alert Behavior During Therapy: WFL for tasks assessed/performed Overall Cognitive Status: Within Functional Limits for tasks assessed    Mobility  Bed Mobility Bed Mobility: Supine to Sit;Sitting - Scoot to [place] of Bed;Sit to Supine Supine to Sit: 4: Min assist;HOB flat Sitting - Scoot to [place] of Bed: 4: Min guard Sit to Supine: 4: Min guard Details for Bed Mobility Assistance: Pt needed initial assist to move the LLE off of the bed but was able to lift it with his own UE to bring it back into the bed when returning to supine.   Transfers Transfers: Sit to Stand Sit to Stand: 4: Min guard;From bed;Without upper extremity assist Stand to Sit: 4: Min guard;With upper extremity assist;To chair/3-in-1       Balance Balance Balance Assessed: Yes Static Standing Balance Static Standing - Balance Support: Right upper extremity supported;Left upper extremity supported Static Standing - Level of Assistance: 5: Stand by assistance   End of Session OT - End of Session Equipment Utilized During Treatment: Rolling walker Activity Tolerance: Patient tolerated treatment well Patient left: in chair;with call bell/phone within reach Nurse Communication: Mobility status     Sharona Rovner OTR/L 08/20/2013, 4:03 PM

## 2013-08-20 NOTE — Progress Notes (Signed)
Subjective: 1 Day Post-Op Procedure(s) (LRB): TOTAL HIP ARTHROPLASTY ANTERIOR APPROACH (Left) Pain under good control.  Activity level:  Patient has been up walking and hallway weightbearing as tolerated Diet tolerance:  ok Voiding:  ok Patient reports pain as 3 on 0-10 scale.    Objective: Vital signs in last 24 hours: Temp:  [97.6 F (36.4 C)-99.2 F (37.3 C)] 99.2 F (37.3 C) (10/29 0444) Pulse Rate:  [55-72] 72 (10/29 0444) Resp:  [7-22] 18 (10/29 0444) BP: (121-142)/(69-94) 121/87 mmHg (10/29 0444) SpO2:  [96 %-100 %] 98 % (10/29 0444) Weight:  [88.451 kg (195 lb)] 88.451 kg (195 lb) (10/29 0311)  Labs:  Recent Labs  08/20/13 0444  HGB 11.4*    Recent Labs  08/20/13 0444  WBC 9.1  RBC 4.24  HCT 34.7*  PLT 192    Recent Labs  08/20/13 0444  NA 134*  K 4.0  CL 96  CO2 30  BUN 12  CREATININE 0.97  GLUCOSE 158*  CALCIUM 8.7   No results found for this basename: LABPT, INR,  in the last 72 hours  Physical Exam:  Neurologically intact ABD soft Neurovascular intact Sensation intact distally Intact pulses distally Dorsiflexion/Plantar flexion intact Incision: dressing C/D/I No cellulitis present Compartment soft  Assessment/Plan:  1 Day Post-Op Procedure(s) (LRB): TOTAL HIP ARTHROPLASTY ANTERIOR APPROACH (Left) Advance diet Up with therapy D/C IV fluids Plan for discharge tomorrow with home therapy. Continue ASA 325 twice a day/SCDs. Anterior hip no precautions.    Romir Klimowicz R 08/20/2013, 8:08 AM

## 2013-08-20 NOTE — Progress Notes (Signed)
Physical Therapy Note  Continuing with excellent progress; On track for dc home tomorrow  Pain L hip 6/10; patient repositioned for comfort   08/20/13 1700  PT Visit Information  Last PT Received On 08/20/13  Assistance Needed +1  History of Present Illness s/p LTHA direct anterior approach  PT Time Calculation  PT Start Time 1536  PT Stop Time 1605  PT Time Calculation (min) 29 min  Subjective Data  Subjective Hip feels tight  Patient Stated Goal go home and care for himself  Precautions  Precautions None  Restrictions  LLE Weight Bearing WBAT  Cognition  Arousal/Alertness Awake/alert  Behavior During Therapy WFL for tasks assessed/performed  Overall Cognitive Status Within Functional Limits for tasks assessed  Bed Mobility  Bed Mobility Supine to Sit  Supine to Sit 4: Min guard  Sitting - Scoot to Edge of Bed 4: Min guard  Details for Bed Mobility Assistance Used UEs to move LLE off of bed  Transfers  Transfers Sit to Stand;Stand to Sit  Sit to Stand From bed;Without upper extremity assist;5: Supervision  Stand to Sit With upper extremity assist;To chair/3-in-1;5: Supervision  Details for Transfer Assistance cues for walker safety  Ambulation/Gait  Ambulation/Gait Assistance 5: Supervision  Ambulation Distance (Feet) 100 Feet  Assistive device Rolling walker  Ambulation/Gait Assistance Details Good progress; Pt stating his hip feels less tight after walking  Gait Pattern Step-through pattern;Decreased stride length  Exercises  Exercises Total Joint  Total Joint Exercises  Ankle Circles/Pumps AROM;Both;20 reps  Quad Sets AROM;Left;20 reps  Gluteal Sets AROM;Both;10 reps  Towel Squeeze AROM;Both;10 reps  Heel Slides AAROM;Left;10 reps  Hip ABduction/ADduction AROM;Left;10 reps;Supine  PT - End of Session  Activity Tolerance Patient tolerated treatment well  Patient left in chair;with call bell/phone within reach  Nurse Communication Mobility status  PT -  Assessment/Plan  PT Plan Current plan remains appropriate  PT Frequency 7X/week  Follow Up Recommendations Home health PT;Supervision - Intermittent  PT equipment Rolling walker with 5" wheels;3in1 (PT)  PT Goal Progression  Progress towards PT goals Progressing toward goals  Acute Rehab PT Goals  PT Goal Formulation With patient  Time For Goal Achievement 08/26/13  Potential to Achieve Goals Good  PT General Charges  $$ ACUTE PT VISIT 1 Procedure  PT Treatments  $Gait Training 8-22 mins  $Therapeutic Exercise 8-22 mins   [place], [place] [phone]

## 2013-08-21 LAB — CBC
HCT: 35.2 % — ABNORMAL LOW (ref 39.0–52.0)
MCH: 27.2 pg (ref 26.0–34.0)
MCHC: 33.5 g/dL (ref 30.0–36.0)
MCV: 81.1 fL (ref 78.0–100.0)
Platelets: 182 10*3/uL (ref 150–400)
RBC: 4.34 MIL/uL (ref 4.22–5.81)
RDW: 14.4 % (ref 11.5–15.5)
WBC: 11.9 10*3/uL — ABNORMAL HIGH (ref 4.0–10.5)

## 2013-08-21 MED ORDER — METHOCARBAMOL 500 MG PO TABS: 500.0000 mg | ORAL_TABLET | Freq: Four times a day (QID) | ORAL | Status: DC | PRN

## 2013-08-21 MED ORDER — ASPIRIN 325 MG PO TBEC: 325.0000 mg | DELAYED_RELEASE_TABLET | Freq: Two times a day (BID) | ORAL | Status: DC

## 2013-08-21 MED ORDER — OXYCODONE HCL 5 MG PO TABS: 5.0000 mg | ORAL_TABLET | ORAL | Status: DC | PRN

## 2013-08-21 NOTE — Progress Notes (Signed)
Subjective: 2 Days Post-Op Procedure(s) (LRB): TOTAL HIP ARTHROPLASTY ANTERIOR APPROACH (Left)  Activity level:  OOB and amb Diet tolerance:  regular Voiding:  Well in BR Patient reports pain as mild.    Objective: Vital signs in last 24 hours: Temp:  [98.2 F (36.8 C)-100.2 F (37.9 C)] 99.8 F (37.7 C) (10/30 0447) Pulse Rate:  [73-83] 83 (10/30 0901) Resp:  [16-18] 16 (10/30 0447) BP: (128-135)/(74-86) 133/74 mmHg (10/30 0901) SpO2:  [94 %-96 %] 94 % (10/30 0447)  Labs:  Recent Labs  08/20/13 0444 08/21/13 0532  HGB 11.4* 11.8*    Recent Labs  08/20/13 0444 08/21/13 0532  WBC 9.1 11.9*  RBC 4.24 4.34  HCT 34.7* 35.2*  PLT 192 182    Recent Labs  08/20/13 0444  NA 134*  K 4.0  CL 96  CO2 30  BUN 12  CREATININE 0.97  GLUCOSE 158*  CALCIUM 8.7   No results found for this basename: LABPT, INR,  in the last 72 hours  Physical Exam:  Neurologically intact ABD soft Sensation intact distally Intact pulses distally Incision: no drainage Compartment soft  Assessment/Plan:  2 Days Post-Op Procedure(s) (LRB): TOTAL HIP ARTHROPLASTY ANTERIOR APPROACH (Left) Discharge home with home health    Jakyren Fluegge G 08/21/2013, 9:51 AM

## 2013-08-21 NOTE — Progress Notes (Signed)
Physical Therapy Treatment Patient Details Name: Evan Brock MRN: [id number] DOB: 1951/10/20 Today's Date: 08/21/2013 Time: 1191-4782 PT Time Calculation (min): 24 min  PT Assessment / Plan / Recommendation  History of Present Illness s/p LTHA direct anterior approach   PT Comments   Pt moving well.  Reports pain & tightness decrease with walking.  Pt safe to d/c home from mobility standpoint when MD feels medically ready.     Follow Up Recommendations  Home health PT;Supervision - Intermittent     Does the patient have the potential to tolerate intense rehabilitation     Barriers to Discharge        Equipment Recommendations  Rolling walker with 5" wheels;3in1 (PT)    Recommendations for Other Services    Frequency 7X/week   Progress towards PT Goals Progress towards PT goals: Progressing toward goals  Plan Current plan remains appropriate    Precautions / Restrictions Precautions Precautions: None Restrictions LLE Weight Bearing: Weight bearing as tolerated   Pertinent Vitals/Pain 7/10 Lt hip before & states "it's not as painful or tight" while walking.  Premedicated.  Repositioned for comfort.      Mobility  Bed Mobility Bed Mobility: Not assessed Transfers Transfers: Sit to Stand;Stand to Sit Sit to Stand: 6: Modified independent (Device/Increase time);With upper extremity assist;With armrests;From chair/3-in-1 Stand to Sit: 6: Modified independent (Device/Increase time);With upper extremity assist;With armrests;To chair/3-in-1 Details for Transfer Assistance: pt demonstrates safe hand placement & technique Ambulation/Gait Ambulation/Gait Assistance: 5: Supervision Ambulation Distance (Feet): 150 Feet Assistive device: Rolling walker Ambulation/Gait Assistance Details: Slow but steady.  Step-through pattern.  pt reports hip feels less tight & decreased pain after walking Gait Pattern: Step-through pattern Gait velocity: slow Stairs: No Wheelchair  Mobility Wheelchair Mobility: No    Exercises Total Joint Exercises Ankle Circles/Pumps: AROM;Both Gluteal Sets: AROM;Strengthening;Both Heel Slides: AAROM;Left;10 reps Hip ABduction/ADduction: AAROM;Left;5 reps Straight Leg Raises: AAROM;Strengthening;Left;5 reps Marching in Standing: AROM;Strengthening;Left;10 reps;Standing     PT Goals (current goals can now be found in the care plan section) Acute Rehab PT Goals PT Goal Formulation: With patient Time For Goal Achievement: 08/26/13 Potential to Achieve Goals: Good  Visit Information  Last PT Received On: 08/21/13 Assistance Needed: +1 History of Present Illness: s/p LTHA direct anterior approach    Subjective Data      Cognition  Cognition Arousal/Alertness: Awake/alert Behavior During Therapy: WFL for tasks assessed/performed Overall Cognitive Status: Within Functional Limits for tasks assessed    Balance     End of Session PT - End of Session Activity Tolerance: Patient tolerated treatment well Patient left: in chair;with call bell/phone within reach Nurse Communication: Mobility status   GP     Lara Mulch 08/21/2013, 9:43 AM  Verdell Face, PTA [phone] 08/21/2013

## 2013-08-21 NOTE — Discharge Summary (Signed)
Patient ID: Evan Brock MRN: [id number] DOB/AGE: [birth year] 62 y.o.  Admit date: 08/19/2013 Discharge date: 08/21/2013  Admission Diagnoses:  Principal Problem:   DJD (degenerative joint disease) of hip   Discharge Diagnoses:  Same  Past Medical History  Diagnosis Date  . Hypertension   . Asthma   . Exertional shortness of breath   . Arthritis     "hips" (08/19/2013)    Surgeries: Procedure(s): TOTAL HIP ARTHROPLASTY ANTERIOR APPROACH on 08/19/2013   Consultants:    Discharged Condition: Improved  Hospital Course: Evan Brock is an 62 y.o. male who was admitted 08/19/2013 for operative treatment ofDJD (degenerative joint disease) of hip. Patient has severe unremitting pain that affects sleep, daily activities, and work/hobbies. After pre-op clearance the patient was taken to the operating room on 08/19/2013 and underwent  Procedure(s): TOTAL HIP ARTHROPLASTY ANTERIOR APPROACH.    Patient was given perioperative antibiotics: Anti-infectives   Start     Dose/Rate Route Frequency Ordered Stop   08/19/13 1400  ceFAZolin (ANCEF) IVPB 2 g/50 mL premix     2 g 100 mL/hr over 30 Minutes Intravenous Every 6 hours 08/19/13 1123 08/19/13 2245   08/19/13 0600  ceFAZolin (ANCEF) IVPB 2 g/50 mL premix     2 g 100 mL/hr over 30 Minutes Intravenous On call to O.R. 08/18/13 1422 08/19/13 0747       Patient was given sequential compression devices, early ambulation, and chemoprophylaxis to prevent DVT.  Patient benefited maximally from hospital stay and there were no complications.    Recent vital signs: Patient Vitals for the past 24 hrs:  BP Temp Temp src Pulse Resp SpO2  08/21/13 0901 133/74 mmHg - - 83 - -  08/21/13 0447 135/86 mmHg 99.8 F (37.7 C) Oral 82 16 94 %  08/20/13 1943 128/84 mmHg 100.2 F (37.9 C) Oral 79 16 95 %  08/20/13 1600 - - - - 16 -  08/20/13 1337 130/86 mmHg 98.2 F (36.8 C) - 73 18 96 %  08/20/13 1200 - - - - 16 -     Recent laboratory studies:   Recent Labs  08/20/13 0444 08/21/13 0532  WBC 9.1 11.9*  HGB 11.4* 11.8*  HCT 34.7* 35.2*  PLT 192 182  NA 134*  --   K 4.0  --   CL 96  --   CO2 30  --   BUN 12  --   CREATININE 0.97  --   GLUCOSE 158*  --   CALCIUM 8.7  --      Discharge Medications:     Medication List    STOP taking these medications       ibuprofen 600 MG tablet  Commonly known as:  ADVIL,MOTRIN      TAKE these medications       albuterol 108 (90 BASE) MCG/ACT inhaler  Commonly known as:  PROVENTIL HFA;VENTOLIN HFA  Inhale 2 puffs into the lungs every 4 (four) hours as needed. For shortness of breath.     aspirin 325 MG EC tablet  Take 1 tablet (325 mg total) by mouth 2 (two) times daily after a meal.     atenolol 50 MG tablet  Commonly known as:  TENORMIN  Take 50 mg by mouth every morning.     hydrochlorothiazide 25 MG tablet  Commonly known as:  HYDRODIURIL  Take 25 mg by mouth every morning.     methocarbamol 500 MG tablet  Commonly known as:  ROBAXIN  Take 1 tablet (500  mg total) by mouth every 6 (six) hours as needed.     oxyCODONE 5 MG immediate release tablet  Commonly known as:  Oxy IR/ROXICODONE  Take 1-2 tablets (5-10 mg total) by mouth every 3 (three) hours as needed.        Diagnostic Studies: Dg Hip Operative Left  08/19/2013   CLINICAL DATA:  Left hip arthroplasty.  EXAM: OPERATIVE LEFT HIP  COMPARISON:  02/15/2013  FINDINGS: Placement of left hip arthroplasty. The left hip arthroplasty appears to be located and intact. The entire femoral stem is visualized. No evidence for a periprosthetic fracture. The patient also has a right hip arthroplasty.  IMPRESSION: Placement of left hip arthroplasty. No complicating features.   Electronically Signed   By: Richarda Overlie M.D.   On: 08/19/2013 10:21    Disposition: 01-Home or Self Care      Discharge Orders   Future Orders Complete By Expires   Call MD / Call 911  As directed    Comments:     If you experience chest pain  or shortness of breath, CALL 911 and be transported to the hospital emergency room.  If you develope a fever above 101 F, pus (white drainage) or increased drainage or redness at the wound, or calf pain, call your surgeon's office.   Constipation Prevention  As directed    Comments:     Drink plenty of fluids.  Prune juice may be helpful.  You may use a stool softener, such as Colace (over the counter) 100 mg twice a day.  Use MiraLax (over the counter) for constipation as needed.   Diet - low sodium heart healthy  As directed    Increase activity slowly as tolerated  As directed       Follow-up Information   Follow up with DALLDORF,PETER G, MD. Call in 2 weeks.   Specialty:  Orthopedic Surgery   Contact information:   [address] [place] [address] [phone]        Signed: Prince Rome 08/21/2013, 10:02 AM

## 2013-10-01 ENCOUNTER — Inpatient Hospital Stay (HOSPITAL_COMMUNITY): Payer: Medicare Other

## 2013-10-01 ENCOUNTER — Emergency Department (HOSPITAL_COMMUNITY): Payer: Medicare Other

## 2013-10-01 ENCOUNTER — Inpatient Hospital Stay (HOSPITAL_COMMUNITY)
Admission: EM | Admit: 2013-10-01 | Discharge: 2013-10-02 | DRG: 189 | Disposition: A | Discharge status: Home / Self Care | Payer: Medicare Other | Attending: Internal Medicine | Admitting: Internal Medicine

## 2013-10-01 ENCOUNTER — Encounter (HOSPITAL_COMMUNITY): Payer: Self-pay | Admitting: General Practice

## 2013-10-01 DIAGNOSIS — I10 Essential (primary) hypertension: Secondary | ICD-10-CM

## 2013-10-01 DIAGNOSIS — M169 Osteoarthritis of hip, unspecified: Secondary | ICD-10-CM

## 2013-10-01 DIAGNOSIS — J40 Bronchitis, not specified as acute or chronic: Secondary | ICD-10-CM

## 2013-10-01 DIAGNOSIS — J441 Chronic obstructive pulmonary disease with (acute) exacerbation: Secondary | ICD-10-CM | POA: Diagnosis present

## 2013-10-01 DIAGNOSIS — N19 Unspecified kidney failure: Secondary | ICD-10-CM

## 2013-10-01 DIAGNOSIS — Z87891 Personal history of nicotine dependence: Secondary | ICD-10-CM

## 2013-10-01 DIAGNOSIS — E860 Dehydration: Secondary | ICD-10-CM

## 2013-10-01 DIAGNOSIS — N179 Acute kidney failure, unspecified: Secondary | ICD-10-CM

## 2013-10-01 DIAGNOSIS — E871 Hypo-osmolality and hyponatremia: Secondary | ICD-10-CM | POA: Diagnosis present

## 2013-10-01 DIAGNOSIS — M129 Arthropathy, unspecified: Secondary | ICD-10-CM | POA: Diagnosis present

## 2013-10-01 DIAGNOSIS — Z96649 Presence of unspecified artificial hip joint: Secondary | ICD-10-CM

## 2013-10-01 DIAGNOSIS — D696 Thrombocytopenia, unspecified: Secondary | ICD-10-CM | POA: Diagnosis present

## 2013-10-01 DIAGNOSIS — Z79899 Other long term (current) drug therapy: Secondary | ICD-10-CM

## 2013-10-01 DIAGNOSIS — R0902 Hypoxemia: Secondary | ICD-10-CM

## 2013-10-01 DIAGNOSIS — J9600 Acute respiratory failure, unspecified whether with hypoxia or hypercapnia: Principal | ICD-10-CM

## 2013-10-01 DIAGNOSIS — J45901 Unspecified asthma with (acute) exacerbation: Secondary | ICD-10-CM

## 2013-10-01 DIAGNOSIS — J449 Chronic obstructive pulmonary disease, unspecified: Secondary | ICD-10-CM

## 2013-10-01 LAB — CBC WITH DIFFERENTIAL/PLATELET
Basophils Relative: 0 % (ref 0–1)
Hemoglobin: 13.2 g/dL (ref 13.0–17.0)
Lymphocytes Relative: 14 % (ref 12–46)
Lymphs Abs: 0.6 10*3/uL — ABNORMAL LOW (ref 0.7–4.0)
MCV: 77.1 fL — ABNORMAL LOW (ref 78.0–100.0)
Monocytes Relative: 9 % (ref 3–12)
Neutro Abs: 2.5 10*3/uL (ref 1.7–7.7)
Neutrophils Relative %: 54 % (ref 43–77)
RBC: 5.12 MIL/uL (ref 4.22–5.81)
WBC: 4.6 10*3/uL (ref 4.0–10.5)

## 2013-10-01 LAB — URINALYSIS, ROUTINE W REFLEX MICROSCOPIC
Bilirubin Urine: NEGATIVE
Glucose, UA: NEGATIVE mg/dL
Ketones, ur: 15 mg/dL — AB
Leukocytes, UA: NEGATIVE
Nitrite: NEGATIVE
Protein, ur: NEGATIVE mg/dL
Urobilinogen, UA: 1 mg/dL (ref 0.0–1.0)
pH: 5.5 (ref 5.0–8.0)

## 2013-10-01 LAB — PROTIME-INR: INR: 1.06 (ref 0.00–1.49)

## 2013-10-01 LAB — COMPREHENSIVE METABOLIC PANEL
ALT: 27 U/L (ref 0–53)
Alkaline Phosphatase: 79 U/L (ref 39–117)
BUN: 28 mg/dL — ABNORMAL HIGH (ref 6–23)
Chloride: 98 mEq/L (ref 96–112)
GFR calc Af Amer: 39 mL/min — ABNORMAL LOW (ref >90)
Glucose, Bld: 107 mg/dL — ABNORMAL HIGH (ref 70–99)
Potassium: 4.6 mEq/L (ref 3.5–5.1)
Sodium: 132 mEq/L — ABNORMAL LOW (ref 135–145)
Total Bilirubin: 0.3 mg/dL (ref 0.3–1.2)

## 2013-10-01 LAB — POCT I-STAT 3, ART BLOOD GAS (G3+)
Acid-base deficit: 5 mmol/L — ABNORMAL HIGH (ref 0.0–2.0)
Bicarbonate: 18.4 mEq/L — ABNORMAL LOW (ref 20.0–24.0)
O2 Saturation: 92 %
Patient temperature: 98.6
TCO2: 19 mmol/L (ref 0–100)
pCO2 arterial: 29.1 mmHg — ABNORMAL LOW (ref 35.0–45.0)
pH, Arterial: 7.408 (ref 7.350–7.450)
pO2, Arterial: 61 mmHg — ABNORMAL LOW (ref 80.0–100.0)

## 2013-10-01 LAB — LIPASE, BLOOD: Lipase: 25 U/L (ref 11–59)

## 2013-10-01 LAB — CREATININE, URINE, RANDOM: Creatinine, Urine: 171.35 mg/dL

## 2013-10-01 LAB — POCT I-STAT TROPONIN I

## 2013-10-01 MED ORDER — SODIUM CHLORIDE 0.9 % IV SOLN
INTRAVENOUS | Status: DC
  Administered 2013-10-02: 02:00:00 via INTRAVENOUS

## 2013-10-01 MED ORDER — ONDANSETRON HCL 4 MG/2ML IJ SOLN
4.0000 mg | Freq: Once | INTRAMUSCULAR | Status: AC
  Administered 2013-10-01: 4 mg via INTRAVENOUS
  Filled 2013-10-01: qty 2

## 2013-10-01 MED ORDER — SODIUM CHLORIDE 0.9 % IJ SOLN
3.0000 mL | Freq: Two times a day (BID) | INTRAMUSCULAR | Status: DC
  Administered 2013-10-01: 22:00:00 3 mL via INTRAVENOUS

## 2013-10-01 MED ORDER — OXYCODONE HCL 5 MG PO TABS: 5.0000 mg | ORAL_TABLET | Freq: Three times a day (TID) | ORAL | Status: DC | PRN

## 2013-10-01 MED ORDER — METHYLPREDNISOLONE SODIUM SUCC 125 MG IJ SOLR
125.0000 mg | Freq: Once | INTRAMUSCULAR | Status: AC
  Administered 2013-10-01: 125 mg via INTRAVENOUS
  Filled 2013-10-01: qty 2

## 2013-10-01 MED ORDER — SODIUM CHLORIDE 0.9 % IV SOLN
INTRAVENOUS | Status: DC
  Administered 2013-10-01: 16:00:00 via INTRAVENOUS

## 2013-10-01 MED ORDER — TECHNETIUM TO 99M ALBUMIN AGGREGATED
6.0000 | Freq: Once | INTRAVENOUS | Status: AC | PRN
  Administered 2013-10-01: 6 via INTRAVENOUS

## 2013-10-01 MED ORDER — ALBUTEROL SULFATE (5 MG/ML) 0.5% IN NEBU: 2.5000 mg | INHALATION_SOLUTION | RESPIRATORY_TRACT | Status: DC | PRN

## 2013-10-01 MED ORDER — ONDANSETRON HCL 4 MG/2ML IJ SOLN: 4.0000 mg | Freq: Four times a day (QID) | INTRAMUSCULAR | Status: DC | PRN

## 2013-10-01 MED ORDER — DIPHENHYDRAMINE HCL 25 MG PO CAPS
50.0000 mg | ORAL_CAPSULE | Freq: Every evening | ORAL | Status: DC | PRN
  Administered 2013-10-01: 23:00:00 50 mg via ORAL
  Filled 2013-10-01: qty 2

## 2013-10-01 MED ORDER — ALBUTEROL SULFATE (5 MG/ML) 0.5% IN NEBU
5.0000 mg | INHALATION_SOLUTION | Freq: Once | RESPIRATORY_TRACT | Status: AC
  Administered 2013-10-01: 5 mg via RESPIRATORY_TRACT
  Filled 2013-10-01: qty 1

## 2013-10-01 MED ORDER — IPRATROPIUM BROMIDE 0.02 % IN SOLN
0.5000 mg | Freq: Once | RESPIRATORY_TRACT | Status: AC
  Administered 2013-10-01: 0.5 mg via RESPIRATORY_TRACT
  Filled 2013-10-01: qty 2.5

## 2013-10-01 MED ORDER — TECHNETIUM TC 99M DIETHYLENETRIAME-PENTAACETIC ACID: 40.0000 | Freq: Once | INTRAVENOUS | Status: AC | PRN

## 2013-10-01 MED ORDER — IPRATROPIUM BROMIDE 0.02 % IN SOLN: 0.5000 mg | RESPIRATORY_TRACT | Status: DC | PRN

## 2013-10-01 MED ORDER — SODIUM CHLORIDE 0.9 % IV BOLUS (SEPSIS)
1000.0000 mL | Freq: Once | INTRAVENOUS | Status: AC
  Administered 2013-10-01: 1000 mL via INTRAVENOUS

## 2013-10-01 MED ORDER — METHYLPREDNISOLONE SODIUM SUCC 125 MG IJ SOLR
60.0000 mg | Freq: Three times a day (TID) | INTRAMUSCULAR | Status: DC
  Administered 2013-10-01 – 2013-10-02 (×4): 60 mg via INTRAVENOUS
  Filled 2013-10-01 (×6): qty 0.96

## 2013-10-01 MED ORDER — ONDANSETRON HCL 4 MG PO TABS: 4.0000 mg | ORAL_TABLET | Freq: Four times a day (QID) | ORAL | Status: DC | PRN

## 2013-10-01 NOTE — H&P (Signed)
[facility] History and Physical  Evan Brock ZOX:[id number] DOB: 1951/09/27 DOA: 10/01/2013  Referring physician: EDP PCP:dR Dareen Piano Specialists: none  Chief Complaint: sob for 3 days.   HPI: Evan Brock is a 62 y.o. male with h/o asthma, has been using albuterol inhaler for the last 4 years, recent left hip replacement came in for worsening sob over the last 3 days associated with dry cough, no fever or chills, myalgias. No orthopnea or pnd, no pedal edema. He was found to be diffusely wheezing on arrival to ED . He was given solumedrol and breathing treatments and his breathing improved. He was put on 2 lit [place] oxygen and referred to medical service for admission. His labs revealed thrombocytopenia of 71,000 and elevated d dimer. He was also found to be in acute kidney injury. He was admitted for evaluation and management of asthma exacerbation and PE.   Review of Systems: The patient denies anorexia, fever, weight loss,, vision loss, decreased hearing, hoarseness, chest pain, syncope, peripheral edema, balance deficits, hemoptysis, abdominal pain, melena, hematochezia, severe indigestion/heartburn, hematuria, incontinence, genital sores, muscle weakness, suspicious skin lesions, transient blindness, difficulty walking, depression, unusual weight change, abnormal bleeding, enlarged lymph nodes, angioedema, and breast masses.    Past Medical History  Diagnosis Date  . Hypertension   . Asthma   . Exertional shortness of breath   . Arthritis     "hips" (08/19/2013)   Past Surgical History  Procedure Laterality Date  . Total hip arthroplasty Right 2010  . Total hip arthroplasty Left 08/19/2013  . Total hip arthroplasty Left 08/19/2013    Procedure: TOTAL HIP ARTHROPLASTY ANTERIOR APPROACH;  Surgeon: Velna Ochs, MD;  Location: [facility] OR;  Service: Orthopedics;  Laterality: Left;   Social History:  reports that he has quit smoking. His smoking use included Cigarettes. He has a 1  pack-year smoking history. He has never used smokeless tobacco. He reports that he drinks about 5.4 ounces of alcohol per week. He reports that he uses illicit drugs (Marijuana).  where does patient live--home,   Can patient participate in ADLs? YES  No Known Allergies  No family history on file.   Prior to Admission medications   Medication Sig Start Date End Date Taking? Authorizing Provider  albuterol (PROVENTIL HFA;VENTOLIN HFA) 108 (90 BASE) MCG/ACT inhaler Inhale 2 puffs into the lungs every 4 (four) hours as needed. For shortness of breath.   Yes Historical Provider, MD  atenolol (TENORMIN) 50 MG tablet Take 50 mg by mouth every morning.    Yes Historical Provider, MD  diphenhydramine-acetaminophen (TYLENOL PM) 25-500 MG TABS Take 1-2 tablets by mouth at bedtime as needed (for sleep).   Yes Historical Provider, MD  hydrochlorothiazide (HYDRODIURIL) 25 MG tablet Take 25 mg by mouth every morning.    Yes Historical Provider, MD  ibuprofen (ADVIL,MOTRIN) 600 MG tablet Take 600 mg by mouth every 6 (six) hours as needed for moderate pain.   Yes Historical Provider, MD  oxyCODONE (OXY IR/ROXICODONE) 5 MG immediate release tablet Take 5-10 mg by mouth 3 (three) times daily as needed for moderate pain or severe pain.   Yes Historical Provider, MD  sulfamethoxazole-trimethoprim (BACTRIM DS,SEPTRA DS) 800-160 MG per tablet Take 1 tablet by mouth 2 (two) times daily.   Yes Historical Provider, MD   Physical Exam: Filed Vitals:   10/01/13 1632  BP:   Pulse:   Temp:   Resp: 22    Constitutional: Vital signs reviewed.  Patient is a well-developed and well-nourished  in no acute distress and cooperative with exam. Alert and oriented x3.  Head: Normocephalic and atraumatic Mouth: no erythema or exudates, MMM Eyes: PERRL, EOMI, conjunctivae normal, No scleral icterus.  Neck: Supple, Trachea midline normal ROM, No JVD, mass, thyromegaly, or carotid bruit present.  Cardiovascular: RRR, S1 normal,  S2 normal, no MRG, pulses symmetric and intact bilaterally Pulmonary/Chest: SCATTERED RHONCHI AND wheezing heard.  Abdominal: Soft. Non-tender, non-distended, bowel sounds are normal, no masses, organomegaly, or guarding present.  Musculoskeletal: No joint deformities, erythema, or stiffness, ROM full and no nontender Neurological: A&O x3, Strength is normal and symmetric bilaterally, cranial nerve II-XII are grossly intact, no focal motor deficit, sensory intact to light touch bilaterally.  Skin: Warm, dry and intact. No rash, cyanosis, or clubbing.  Psychiatric: Normal mood and affect. speech and behavior is normal. Judgment and thought content normal. Cognition and memory are normal.     Labs on Admission:  Basic Metabolic Panel:  Recent Labs Lab 10/01/13 1215  NA 132*  K 4.6  CL 98  CO2 24  GLUCOSE 107*  BUN 28*  CREATININE 2.00*  CALCIUM 9.0   Liver Function Tests:  Recent Labs Lab 10/01/13 1215  AST 25  ALT 27  ALKPHOS 79  BILITOT 0.3  PROT 7.8  ALBUMIN 3.9    Recent Labs Lab 10/01/13 1215  LIPASE 25   No results found for this basename: AMMONIA,  in the last 168 hours CBC:  Recent Labs Lab 10/01/13 1215  WBC 4.6  NEUTROABS 2.5  HGB 13.2  HCT 39.5  MCV 77.1*  PLT 71*   Cardiac Enzymes: No results found for this basename: CKTOTAL, CKMB, CKMBINDEX, TROPONINI,  in the last 168 hours  BNP (last 3 results) No results found for this basename: PROBNP,  in the last 8760 hours CBG: No results found for this basename: GLUCAP,  in the last 168 hours  Radiological Exams on Admission: Dg Chest 2 View  10/01/2013   CLINICAL DATA:  Shortness of breath.  Nausea.  EXAM: CHEST  2 VIEW  COMPARISON:  06/02/2013  FINDINGS: Heart size and pulmonary vascularity are normal and the lungs are clear except for slight chronic peribronchial thickening. No effusions. No osseous abnormality.  IMPRESSION: Chronic peribronchial thickening. This could be seen with chronic  bronchitis.   Electronically Signed   By: Geanie Cooley M.D.   On: 10/01/2013 13:04    EKG: sinus with PVC'S  Assessment/Plan Principal Problem:   Acute respiratory failure Active Problems:   Asthma exacerbation   Acute renal failure   Hypertension   COPD (chronic obstructive pulmonary disease)  1. ACUTE HYPOXIC RESPIRATORY FAILURE: - Admitted to telemetry.  - probably secondary to asthma exacerbation vs PE.  - his wheezing has improved with solumedrol and bronchodilators.  - d dimer is elevated and it is followed up with V/Q scan.  - continue with [place] oxygen to keep oxygen sats greater than 90% - continue with solumedrol 60 mg q8hrs, bronchodilators.  2. Thrombocytopenia: - unclear etiology.  - no obvious signs of bleeding.  - repeat platelet count.   3. Acute kidney injury: - probably pre renal in origin/ dehydration.  - will get urine analysis, US renal to evaluate for hydronephrosis and renal electrolytes to evaluate FEna.  - continue to monitor renal parameters.    4. HYPonatremia: - probably from dehydration. Repeat labs in am.  - hydration with IV fluids.   5. Elevated D DiMER: because of his recent hip fracture will get  v/q scan to evaluate for PE.   6. Hypertension: - BP parameters borderline in ED, will hold the bp meds now.   7. DVT prophylaxis. SCD'S because of thrombocytopenia  Code Status: full code Family Communication: none at bedside, discussed the plan of care with the patient.  Disposition Plan: pending.   Time spent: 75 minutes.   [facility] Pager [phone]  If 7PM-7AM, please contact night-coverage [email or web address] Password [facility] 10/01/2013, 5:24 PM

## 2013-10-01 NOTE — ED Notes (Signed)
When patient was walked his SpO2 was 65% RA.

## 2013-10-01 NOTE — ED Notes (Addendum)
Pt states that for 3 days he's had SOB, nausea, dry mouth.  Pt also states he is cold all the time, but denies fever.  Pt alert and oriented.  Grips equal, no drift.  Pt had hip replacement a couple of months ago.  Pt's family called MD, but was instructed to report to ED.

## 2013-10-01 NOTE — Progress Notes (Addendum)
Call report from ED 1536, left message. Rec Pt on floor 1625 from ED. Pt on 2l has mild labor breathing at rest. Put on continuous o2 monitor MD in with Pt with RN. Pt to go for VQ. Will continue to monitor

## 2013-10-01 NOTE — ED Notes (Signed)
Walked patient to see what oxygen level was and it was 65% RA

## 2013-10-01 NOTE — ED Provider Notes (Addendum)
CSN: [id number]     Arrival date & time 10/01/13  1117 History   First MD Initiated Contact with Patient 10/01/13 1131     Chief Complaint  Patient presents with  . Shortness of Breath  . Nausea   (Consider location/radiation/quality/duration/timing/severity/associated sxs/prior Treatment) The history is provided by the patient.  Evan Brock is a 62 y.o. male history of hypertension, asthma, recent left hip replacement here presenting with shortness of breath. Hip replacement was about 1 month ago and he finished xarelto 2 weeks ago. Shortness of breath last 3 days worse with exertion. He also had some nausea and dry mouth. Denies cough or fever. Went to doctor's office and was noted to be wheezing a left-sided sent in for possible pneumonia versus blood clot. Denies hx of DVT.    Past Medical History  Diagnosis Date  . Hypertension   . Asthma   . Exertional shortness of breath   . Arthritis     "hips" (08/19/2013)   Past Surgical History  Procedure Laterality Date  . Total hip arthroplasty Right 2010  . Total hip arthroplasty Left 08/19/2013  . Total hip arthroplasty Left 08/19/2013    Procedure: TOTAL HIP ARTHROPLASTY ANTERIOR APPROACH;  Surgeon: Velna Ochs, MD;  Location: [facility] OR;  Service: Orthopedics;  Laterality: Left;   No family history on file. History  Substance Use Topics  . Smoking status: Former Smoker -- 0.50 packs/day for 2 years    Types: Cigarettes  . Smokeless tobacco: Never Used     Comment: 08/19/2013 "quit smoking ~ 15-20 yr ago"  . Alcohol Use: 5.4 oz/week    9 Cans of beer per week     Comment: 08/19/2013 "3 times/wk; 3 beers/night"    Review of Systems  Respiratory: Positive for shortness of breath.   All other systems reviewed and are negative.    Allergies  Review of patient's allergies indicates no known allergies.  Home Medications   Current Outpatient Rx  Name  Route  Sig  Dispense  Refill  . albuterol (PROVENTIL HFA;VENTOLIN  HFA) 108 (90 BASE) MCG/ACT inhaler   Inhalation   Inhale 2 puffs into the lungs every 4 (four) hours as needed. For shortness of breath.         Marland Kitchen atenolol (TENORMIN) 50 MG tablet   Oral   Take 50 mg by mouth every morning.          . diphenhydramine-acetaminophen (TYLENOL PM) 25-500 MG TABS   Oral   Take 1-2 tablets by mouth at bedtime as needed (for sleep).         . hydrochlorothiazide (HYDRODIURIL) 25 MG tablet   Oral   Take 25 mg by mouth every morning.          Marland Kitchen ibuprofen (ADVIL,MOTRIN) 600 MG tablet   Oral   Take 600 mg by mouth every 6 (six) hours as needed for moderate pain.         Marland Kitchen oxyCODONE (OXY IR/ROXICODONE) 5 MG immediate release tablet   Oral   Take 5-10 mg by mouth 3 (three) times daily as needed for moderate pain or severe pain.         Marland Kitchen sulfamethoxazole-trimethoprim (BACTRIM DS,SEPTRA DS) 800-160 MG per tablet   Oral   Take 1 tablet by mouth 2 (two) times daily.          BP 151/86  Pulse 91  Temp(Src) 97.9 F (36.6 C) (Oral)  Resp 20  Ht 5\' 9"  (1.753 m)  Wt 205 lb (92.987 kg)  BMI 30.26 kg/m2  SpO2 97% Physical Exam  Nursing note and vitals reviewed. Constitutional: He is oriented to person, place, and time. He appears well-developed and well-nourished.  HENT:  Head: Normocephalic.  Mouth/Throat: Oropharynx is clear and moist.  Eyes: Conjunctivae are normal. Pupils are equal, round, and reactive to light.  Neck: Normal range of motion. Neck supple.  Cardiovascular: Normal rate, regular rhythm and normal heart sounds.   Pulmonary/Chest:  Slightly tachypneic, + wheezing on L > R side. No crackles   Abdominal: Soft. Bowel sounds are normal. He exhibits no distension. There is no tenderness. There is no rebound and no guarding.  Musculoskeletal: Normal range of motion. He exhibits no edema and no tenderness.  Neurological: He is alert and oriented to person, place, and time.  Skin: Skin is warm and dry.  Psychiatric: He has a normal  mood and affect. His behavior is normal. Judgment and thought content normal.    ED Course  Procedures (including critical care time) Labs Review Labs Reviewed  CBC WITH DIFFERENTIAL - Abnormal; Notable for the following:    MCV 77.1 (*)    MCH 25.8 (*)    Lymphs Abs 0.6 (*)    Eosinophils Relative 24 (*)    Eosinophils Absolute 1.1 (*)    All other components within normal limits  COMPREHENSIVE METABOLIC PANEL - Abnormal; Notable for the following:    Sodium 132 (*)    Glucose, Bld 107 (*)    BUN 28 (*)    Creatinine, Ser 2.00 (*)    GFR calc non Af Amer 34 (*)    GFR calc Af Amer 39 (*)    All other components within normal limits  LIPASE, BLOOD  PROTIME-INR  POCT I-STAT TROPONIN I   Imaging Review Dg Chest 2 View  10/01/2013   CLINICAL DATA:  Shortness of breath.  Nausea.  EXAM: CHEST  2 VIEW  COMPARISON:  06/02/2013  FINDINGS: Heart size and pulmonary vascularity are normal and the lungs are clear except for slight chronic peribronchial thickening. No effusions. No osseous abnormality.  IMPRESSION: Chronic peribronchial thickening. This could be seen with chronic bronchitis.   Electronically Signed   By: Geanie Cooley M.D.   On: 10/01/2013 13:04    EKG Interpretation    Date/Time:  Wednesday October 01 2013 11:24:31 EST Ventricular Rate:  83 PR Interval:  162 QRS Duration: 78 QT Interval:  352 QTC Calculation: 413 R Axis:   83 Text Interpretation:  Sinus rhythm with occasional Premature ventricular complexes Otherwise normal ECG No significant change since last tracing Confirmed by Therman Hughlett  MD, Odalys Win [phone]) on 10/01/2013 11:32:42 AM            MDM  No diagnosis found. Evan Brock is a 62 y.o. male here with SOB. Wheezing on L side. Consider asthma vs pneumonia. PE less likely. Will give nebs, get cxr and reassess.   1:55 PM After 2 nebs, wheezing throughout now. Desat to 65% while ambulating. Given steroids and third neb. No need for bipap. Also has acute  renal failure, hydrated in the ED. Will admit to [facility]. Dr. Blake Divine recommend d-dimer. Will admit to tele.    Richardean Canal, MD 10/01/13 1358  Richardean Canal, MD 10/01/13 [phone]

## 2013-10-02 ENCOUNTER — Inpatient Hospital Stay (HOSPITAL_COMMUNITY): Payer: Medicare Other

## 2013-10-02 LAB — CBC
MCH: 25.7 pg — ABNORMAL LOW (ref 26.0–34.0)
MCHC: 32.8 g/dL (ref 30.0–36.0)
MCV: 78.2 fL (ref 78.0–100.0)
Platelets: 84 10*3/uL — ABNORMAL LOW (ref 150–400)
RBC: 4.36 MIL/uL (ref 4.22–5.81)

## 2013-10-02 LAB — BASIC METABOLIC PANEL
CO2: 23 mEq/L (ref 19–32)
Calcium: 8.3 mg/dL — ABNORMAL LOW (ref 8.4–10.5)
Chloride: 104 mEq/L (ref 96–112)
Glucose, Bld: 196 mg/dL — ABNORMAL HIGH (ref 70–99)
Sodium: 134 mEq/L — ABNORMAL LOW (ref 135–145)

## 2013-10-02 MED ORDER — SULFAMETHOXAZOLE-TRIMETHOPRIM 800-160 MG PO TABS: 1.0000 | ORAL_TABLET | Freq: Two times a day (BID) | ORAL | Status: DC

## 2013-10-02 MED ORDER — PREDNISONE 10 MG PO TABS: ORAL_TABLET | ORAL | Status: DC

## 2013-10-02 MED ORDER — DOXYCYCLINE HYCLATE 100 MG PO TABS
100.0000 mg | ORAL_TABLET | Freq: Two times a day (BID) | ORAL | Status: DC
  Administered 2013-10-02: 100 mg via ORAL
  Filled 2013-10-02 (×2): qty 1

## 2013-10-02 MED ORDER — ALBUTEROL SULFATE HFA 108 (90 BASE) MCG/ACT IN AERS: 1.0000 | INHALATION_SPRAY | RESPIRATORY_TRACT | Status: DC | PRN

## 2013-10-02 NOTE — Evaluation (Signed)
Seen and agree with SPT note Shaniyah Wix Tabor Panagiota Perfetti, PT [phone]  

## 2013-10-02 NOTE — Progress Notes (Signed)
Patient discharged to home this afternoon, transported via wife.  IV removed prior to discharge; IV site clean, dry, and intact.  Discharge instructions discussed with patient prior to discharge, patient and his wife voiced understanding of discharge instructions and education.

## 2013-10-02 NOTE — Evaluation (Signed)
Physical Therapy Evaluation Patient Details Name: Evan Brock MRN: [id number] DOB: [birth year] Today's Date: 10/02/2013 Time: 6213-0865 PT Time Calculation (min): 22 min  PT Assessment / Plan / Recommendation History of Present Illness  Dnaiel Brock is a 62 y.o. male with h/o asthma, has been using albuterol inhaler for the last 4 years, recent left hip replacement came in for worsening sob over the last 3 days associated with dry cough, no fever or chills, myalgias. No orthopnea or pnd, no pedal edema. He was found to be diffusely wheezing on arrival to ED . He was given solumedrol and breathing treatments and his breathing improved. He was put on 2 lit [place] oxygen and referred to medical service for admission. His labs revealed thrombocytopenia of 71,000 and elevated d dimer. He was also found to be in acute kidney injury. He was admitted for evaluation and management of asthma exacerbation and PE  Clinical Impression  Patient ambulated with no supplemental O2 today with sats dropping to 88%. Education given to pt regarding energy conservation techniques such as allowing time for recover breaths and rest breaks during activity.Following education pt able to maintain sats >90% and has no need to be sent home with supplemental O2 at this time. He has no further PT needs, as he is at his baseline for mobility. PT signing off.    PT Assessment  Patent does not need any further PT services    Follow Up Recommendations  No PT follow up    Does the patient have the potential to tolerate intense rehabilitation      Barriers to Discharge        Equipment Recommendations  None recommended by PT    Recommendations for Other Services     Frequency      Precautions / Restrictions Precautions Precautions: None   Pertinent Vitals/Pain SpO2 96% on RA at rest seated EOB, HR 72; SpO2 88-96% on RA with ambulation      Mobility  Bed Mobility Bed Mobility: Not assessed Transfers Transfers: Sit  to Stand;Stand to Sit Sit to Stand: 6: Modified independent (Device/Increase time);From bed;With upper extremity assist Stand to Sit: 6: Modified independent (Device/Increase time);To chair/3-in-1;With upper extremity assist Details for Transfer Assistance: slower for safetly; cue for more controlled descent Ambulation/Gait Ambulation/Gait Assistance: 6: Modified independent (Device/Increase time) Ambulation Distance (Feet): 250 Feet Assistive device: Straight cane Gait Pattern: Step-through pattern;Antalgic Gait velocity: decreased General Gait Details: decrease speed and only slightly antalgic due to L hip still recovering from sx Stairs: Yes Stairs Assistance: 6: Modified independent (Device/Increase time) Stair Management Technique: One rail Left;Step to pattern;Forwards Number of Stairs: 8    Exercises     PT Diagnosis:    PT Problem List:   PT Treatment Interventions:       PT Goals(Current goals can be found in the care plan section) Acute Rehab PT Goals PT Goal Formulation: No goals set, d/c therapy  Visit Information  Last PT Received On: 10/02/13 Assistance Needed: +1 History of Present Illness: Evan Brock is a 62 y.o. male with h/o asthma, has been using albuterol inhaler for the last 4 years, recent left hip replacement came in for worsening sob over the last 3 days associated with dry cough, no fever or chills, myalgias. No orthopnea or pnd, no pedal edema. He was found to be diffusely wheezing on arrival to ED . He was given solumedrol and breathing treatments and his breathing improved. He was put on 2 lit [place] oxygen and referred  to medical service for admission. His labs revealed thrombocytopenia of 71,000 and elevated d dimer. He was also found to be in acute kidney injury. He was admitted for evaluation and management of asthma exacerbation and PE       Prior Functioning  Home Living Family/patient expects to be discharged to:: Private residence Living  Arrangements: Spouse/significant other Available Help at Discharge: Family;Available 24 hours/day Type of Home: House Home Access: Level entry Home Layout: Two level Alternate Level Stairs-Number of Steps: 13 Alternate Level Stairs-Rails: Right Home Equipment: Cane - single point;Walker - 2 wheels Prior Function Level of Independence: Independent with assistive device(s) Communication Communication: No difficulties Dominant Hand: Right    Cognition  Cognition Arousal/Alertness: Awake/alert Behavior During Therapy: WFL for tasks assessed/performed Overall Cognitive Status: Within Functional Limits for tasks assessed    Extremity/Trunk Assessment Upper Extremity Assessment Upper Extremity Assessment: Overall WFL for tasks assessed Lower Extremity Assessment Lower Extremity Assessment: Overall WFL for tasks assessed;LLE deficits/detail LLE Deficits / Details: slightly decreased secondary to recent THA but baseline PTA Cervical / Trunk Assessment Cervical / Trunk Assessment: Normal   Balance    End of Session PT - End of Session Equipment Utilized During Treatment: Gait belt Activity Tolerance: Patient tolerated treatment well Patient left: in chair;with call bell/phone within reach;with family/visitor present Nurse Communication: Mobility status  GP     Willette Pa, SPT 10/02/2013, 1:01 PM

## 2013-10-02 NOTE — Progress Notes (Signed)
[facility] PROGRESS NOTE Interim History: 62 y.o. male with h/o asthma, has been using albuterol inhaler for the last 4 years, recent left hip replacement came in for worsening sob over the last 3 days associated with dry cough, no fever or chills, myalgias. No orthopnea or pnd, no pedal edema. He was found to be diffusely wheezing on arrival to ED . He was given solumedrol and breathing treatments and his breathing improved. He was put on 2 lit [place] oxygen and referred to medical service for admission. His labs revealed thrombocytopenia of 71,000 and elevated d dimer. He was also found to be in acute kidney injury.    Assessment/Plan: Acute respiratory failure due to  Asthma exacerbation - on IV solumedrol. Albuterol. - sat > 90 on 2 L. - ambulate Pt and check sats.   Acute renal failure: - resolved with IV fluids. - most likely pre-renal.  Hypertension: -  Stable.   Code Status: full code  Family Communication: none at bedside, discussed the plan of care with the patient.  Disposition Plan: inpatient    Consultants:  none  Procedures:  noen  Antibiotics:  Doxy  HPI/Subjective: Relates he feels better.  Objective: Filed Vitals:   10/01/13 1632 10/01/13 2118 10/02/13 0125 10/02/13 0456  BP:  110/72 134/82 128/77  Pulse:  76 57 61  Temp:  98.1 F (36.7 C) 97.2 F (36.2 C) 97.7 F (36.5 C)  TempSrc:  Oral Oral Oral  Resp: 22 20 18 18   Height:      Weight:    83.28 kg (183 lb 9.6 oz)  SpO2: 97% 100% 98% 98%    Intake/Output Summary (Last 24 hours) at 10/02/13 1038 Last data filed at 10/02/13 0825  Gross per 24 hour  Intake 876.67 ml  Output   1425 ml  Net -548.33 ml   Filed Weights   10/01/13 1131 10/01/13 1628 10/02/13 0456  Weight: 92.987 kg (205 lb) 83.6 kg (184 lb 4.9 oz) 83.28 kg (183 lb 9.6 oz)    Exam:  General: Alert, awake, oriented x3, in no acute distress.  HEENT: No bruits, no goiter.  Heart: Regular rate and rhythm, without  murmurs, rubs, gallops.  Lungs: Good air movement, clear to auscultation Abdomen: Soft, nontender, nondistended, positive bowel sounds.  Neuro: Grossly intact, nonfocal.   Data Reviewed: Basic Metabolic Panel:  Recent Labs Lab 10/01/13 1215 10/02/13 0413  NA 132* 134*  K 4.6 5.3*  CL 98 104  CO2 24 23  GLUCOSE 107* 196*  BUN 28* 21  CREATININE 2.00* 1.11  CALCIUM 9.0 8.3*   Liver Function Tests:  Recent Labs Lab 10/01/13 1215  AST 25  ALT 27  ALKPHOS 79  BILITOT 0.3  PROT 7.8  ALBUMIN 3.9    Recent Labs Lab 10/01/13 1215  LIPASE 25   No results found for this basename: AMMONIA,  in the last 168 hours CBC:  Recent Labs Lab 10/01/13 1215 10/01/13 1810 10/02/13 0413  WBC 4.6  --  2.4*  NEUTROABS 2.5  --   --   HGB 13.2  --  11.2*  HCT 39.5  --  34.1*  MCV 77.1*  --  78.2  PLT 71* 79* 84*   Cardiac Enzymes: No results found for this basename: CKTOTAL, CKMB, CKMBINDEX, TROPONINI,  in the last 168 hours BNP (last 3 results) No results found for this basename: PROBNP,  in the last 8760 hours CBG: No results found for this basename: GLUCAP,  in the last  168 hours  No results found for this or any previous visit (from the past 240 hour(s)).   Studies: Dg Chest 2 View  10/01/2013   CLINICAL DATA:  Shortness of breath.  Nausea.  EXAM: CHEST  2 VIEW  COMPARISON:  06/02/2013  FINDINGS: Heart size and pulmonary vascularity are normal and the lungs are clear except for slight chronic peribronchial thickening. No effusions. No osseous abnormality.  IMPRESSION: Chronic peribronchial thickening. This could be seen with chronic bronchitis.   Electronically Signed   By: Geanie Cooley M.D.   On: 10/01/2013 13:04   Nm Pulmonary Perf And Vent  10/01/2013   CLINICAL DATA:  Shortness of Breath  EXAM: NUCLEAR MEDICINE VENTILATION - PERFUSION LUNG SCAN  Views: Anterior, posterior, left lateral, right lateral, RPO, LPO, RAO, LAO -ventilation perfusion  Radionuclide:  Technetium 34m DTPA -ventilation; Technetium 93m macroaggregated albumin -perfusion  Dose:  40.0 mCi-ventilation; 6.0 mCi-perfusion  Route of administration: Inhalation -ventilation; intravenous -perfusion  COMPARISON:  Chest radiograph October 01, 2013  FINDINGS: Radiotracer uptake on the ventilation study demonstrate some mild photopenia in the bases bilaterally. No segmental ventilation defects are identified.  On the perfusion study, there is some slightly decreased uptake in the right base compared other areas, a defect which is actually smaller than the corresponding ventilation defect. There is no segmental or significant subsegmental perfusion defect. There is no appreciable perfusion mismatch out of proportion to ventilation abnormality.  IMPRESSION: Findings consistent with low probability of pulmonary embolus.   Electronically Signed   By: Bretta Bang M.D.   On: 10/01/2013 18:17    Scheduled Meds: . doxycycline  100 mg Oral Q12H  . methylPREDNISolone (SOLU-MEDROL) injection  60 mg Intravenous Q8H  . sodium chloride  3 mL Intravenous Q12H   Continuous Infusions: . sodium chloride 100 mL/hr at 10/02/13 0139     Marinda Elk  [facility] Pager [phone]. If 8PM-8AM, please contact night-coverage at [email or web address], password [facility] 10/02/2013, 10:38 AM  LOS: 1 day

## 2013-10-02 NOTE — Discharge Summary (Signed)
Physician Discharge Summary  Evan Brock WUJ:[id number] DOB: 1951/01/18 DOA: 10/01/2013  PCP: Provider Not In System  Admit date: 10/01/2013 Discharge date: 10/02/2013  Time spent: 45 minutes  Recommendations for Outpatient Follow-up:  1. Follow up with pulmonary in 4 weeks. Will need PFT's/  Discharge Diagnoses:  Principal Problem:   Acute respiratory failure Active Problems:   Asthma exacerbation   Acute renal failure   Hypertension   COPD (chronic obstructive pulmonary disease)   Discharge Condition: stable  Diet recommendation: regular  Filed Weights   10/01/13 1131 10/01/13 1628 10/02/13 0456  Weight: 92.987 kg (205 lb) 83.6 kg (184 lb 4.9 oz) 83.28 kg (183 lb 9.6 oz)    History of present illness:  62 y.o. male with h/o asthma, has been using albuterol inhaler for the last 4 years, recent left hip replacement came in for worsening sob over the last 3 days associated with dry cough, no fever or chills, myalgias. No orthopnea or pnd, no pedal edema. He was found to be diffusely wheezing on arrival to ED . He was given solumedrol and breathing treatments and his breathing improved. He was put on 2 lit [place] oxygen and referred to medical service for admission. His labs revealed thrombocytopenia of 71,000 and elevated d dimer. He was also found to be in acute kidney injury. He was admitted for evaluation and management of asthma exacerbation and PE   Hospital Course:  Acute respiratory failure due to ? COPD: - never had PFT's, not a heavy tabacco user - Started on IV solumedrol, bactrim and Albuterol.  - sat > 90 on 2 L.  - ambulate Pt and check sats. Remain > 90% on RA. - cont prednisone, albuterol and antibiotics. - follow up with pulmonary as an outpatinet.  Acute renal failure:  - resolved with IV fluids.  - most likely pre-renal.   Hypertension:  - Stable.   Procedures:  CXR  Consultations:  none  Discharge Exam: Filed Vitals:   10/02/13 0456  BP:  128/77  Pulse: 61  Temp: 97.7 F (36.5 C)  Resp: 18    General: A&O x3 Cardiovascular: RRR Respiratory: good air movement CTA B/L  Discharge Instructions      Discharge Orders   Future Orders Complete By Expires   Diet - low sodium heart healthy  As directed    Increase activity slowly  As directed        Medication List         albuterol 108 (90 BASE) MCG/ACT inhaler  Commonly known as:  PROVENTIL HFA;VENTOLIN HFA  Inhale 2 puffs into the lungs every 4 (four) hours as needed. For shortness of breath.     atenolol 50 MG tablet  Commonly known as:  TENORMIN  Take 50 mg by mouth every morning.     diphenhydramine-acetaminophen 25-500 MG Tabs  Commonly known as:  TYLENOL PM  Take 1-2 tablets by mouth at bedtime as needed (for sleep).     hydrochlorothiazide 25 MG tablet  Commonly known as:  HYDRODIURIL  Take 25 mg by mouth every morning.     ibuprofen 600 MG tablet  Commonly known as:  ADVIL,MOTRIN  Take 600 mg by mouth every 6 (six) hours as needed for moderate pain.     oxyCODONE 5 MG immediate release tablet  Commonly known as:  Oxy IR/ROXICODONE  Take 5-10 mg by mouth 3 (three) times daily as needed for moderate pain or severe pain.     predniSONE 10 MG tablet  Commonly known as:  DELTASONE  Takes 6 tablets for 1 days, then 5 tablets for 1 days, then 4 tablets for 1 days, then 3 tablets for 1 days, then 2 tabs for 1 days, then 1 tab for 1 days, and then stop.     sulfamethoxazole-trimethoprim 800-160 MG per tablet  Commonly known as:  BACTRIM DS,SEPTRA DS  Take 1 tablet by mouth 2 (two) times daily.       No Known Allergies Follow-up Information   Follow up with [facility] In 2 weeks. (hospital follow up)    Specialty:  Pulmonology   Contact information:   [address] [place] [address] [phone]      Follow up with Provider Not In System.       The results of significant diagnostics from this hospitalization (including  imaging, microbiology, ancillary and laboratory) are listed below for reference.    Significant Diagnostic Studies: Dg Chest 2 View  10/01/2013   CLINICAL DATA:  Shortness of breath.  Nausea.  EXAM: CHEST  2 VIEW  COMPARISON:  06/02/2013  FINDINGS: Heart size and pulmonary vascularity are normal and the lungs are clear except for slight chronic peribronchial thickening. No effusions. No osseous abnormality.  IMPRESSION: Chronic peribronchial thickening. This could be seen with chronic bronchitis.   Electronically Signed   By: Geanie Cooley M.D.   On: 10/01/2013 13:04   US Renal  10/02/2013   CLINICAL DATA:  Acute renal failure.  EXAM: RENAL/URINARY TRACT ULTRASOUND COMPLETE  COMPARISON:  Noncontrast CT scan of June 10, 2013.  FINDINGS: Right Kidney:  Length: 9.9 cm. There is no hydronephrosis there is cortical thinning in the mid to lower pole which may reflect previous scarring. This is consistent with findings on the previous CT scan.  Left Kidney:  Length: 10.6 cm. The echotexture of the left kidney is within the limits of normal. There is no hydronephrosis. Echogenicity within normal limits. No mass or hydronephrosis visualized.  Bladder:  Appears normal for degree of bladder distention.  IMPRESSION: 1. There is no evidence of hydronephrosis. The echotexture of the renal cortex bilaterally remains slightly lower than that of the adjacent liver. 2. The right kidney demonstrates focal atrophy in the mid to lower pole anterior laterally which has not significantly changed since the CT scan of June 10, 2013. 3. The urinary bladder is normal in appearance.   Electronically Signed   By: David  [place]   On: 10/02/2013 12:08   Nm Pulmonary Perf And Vent  10/01/2013   CLINICAL DATA:  Shortness of Breath  EXAM: NUCLEAR MEDICINE VENTILATION - PERFUSION LUNG SCAN  Views: Anterior, posterior, left lateral, right lateral, RPO, LPO, RAO, LAO -ventilation perfusion  Radionuclide: Technetium 27m DTPA  -ventilation; Technetium 77m macroaggregated albumin -perfusion  Dose:  40.0 mCi-ventilation; 6.0 mCi-perfusion  Route of administration: Inhalation -ventilation; intravenous -perfusion  COMPARISON:  Chest radiograph October 01, 2013  FINDINGS: Radiotracer uptake on the ventilation study demonstrate some mild photopenia in the bases bilaterally. No segmental ventilation defects are identified.  On the perfusion study, there is some slightly decreased uptake in the right base compared other areas, a defect which is actually smaller than the corresponding ventilation defect. There is no segmental or significant subsegmental perfusion defect. There is no appreciable perfusion mismatch out of proportion to ventilation abnormality.  IMPRESSION: Findings consistent with low probability of pulmonary embolus.   Electronically Signed   By: Bretta Bang M.D.   On: 10/01/2013 18:17  Microbiology: No results found for this or any previous visit (from the past 240 hour(s)).   Labs: Basic Metabolic Panel:  Recent Labs Lab 10/01/13 1215 10/02/13 0413  NA 132* 134*  K 4.6 5.3*  CL 98 104  CO2 24 23  GLUCOSE 107* 196*  BUN 28* 21  CREATININE 2.00* 1.11  CALCIUM 9.0 8.3*   Liver Function Tests:  Recent Labs Lab 10/01/13 1215  AST 25  ALT 27  ALKPHOS 79  BILITOT 0.3  PROT 7.8  ALBUMIN 3.9    Recent Labs Lab 10/01/13 1215  LIPASE 25   No results found for this basename: AMMONIA,  in the last 168 hours CBC:  Recent Labs Lab 10/01/13 1215 10/01/13 1810 10/02/13 0413  WBC 4.6  --  2.4*  NEUTROABS 2.5  --   --   HGB 13.2  --  11.2*  HCT 39.5  --  34.1*  MCV 77.1*  --  78.2  PLT 71* 79* 84*   Cardiac Enzymes: No results found for this basename: CKTOTAL, CKMB, CKMBINDEX, TROPONINI,  in the last 168 hours BNP: BNP (last 3 results) No results found for this basename: PROBNP,  in the last 8760 hours CBG: No results found for this basename: GLUCAP,  in the last 168  hours     Signed:  Marinda Elk  [facility] 10/02/2013, 12:28 PM

## 2013-10-02 NOTE — Progress Notes (Signed)
Utilization Review Completed.Evan Brock T12/08/2013  

## 2013-10-26 ENCOUNTER — Emergency Department (INDEPENDENT_AMBULATORY_CARE_PROVIDER_SITE_OTHER)
Admission: EM | Admit: 2013-10-26 | Discharge: 2013-10-26 | Disposition: A | Discharge status: Home / Self Care | Payer: Medicare Other | Source: Home / Self Care

## 2013-10-26 ENCOUNTER — Encounter (HOSPITAL_COMMUNITY): Payer: Self-pay | Admitting: Emergency Medicine

## 2013-10-26 DIAGNOSIS — J449 Chronic obstructive pulmonary disease, unspecified: Secondary | ICD-10-CM

## 2013-10-26 DIAGNOSIS — J441 Chronic obstructive pulmonary disease with (acute) exacerbation: Secondary | ICD-10-CM

## 2013-10-26 MED ORDER — ALBUTEROL SULFATE HFA 108 (90 BASE) MCG/ACT IN AERS: INHALATION_SPRAY | RESPIRATORY_TRACT | Status: DC

## 2013-10-26 MED ORDER — IPRATROPIUM-ALBUTEROL 0.5-2.5 (3) MG/3ML IN SOLN
RESPIRATORY_TRACT | Status: AC
  Filled 2013-10-26: qty 3

## 2013-10-26 MED ORDER — METHYLPREDNISOLONE SODIUM SUCC 125 MG IJ SOLR
125.0000 mg | Freq: Once | INTRAMUSCULAR | Status: AC
  Administered 2013-10-26: 125 mg via INTRAVENOUS

## 2013-10-26 MED ORDER — METHYLPREDNISOLONE SODIUM SUCC 125 MG IJ SOLR
INTRAMUSCULAR | Status: AC
  Filled 2013-10-26: qty 2

## 2013-10-26 MED ORDER — PREDNISONE 5 MG PO TABS: ORAL_TABLET | ORAL | Status: DC

## 2013-10-26 NOTE — Discharge Instructions (Signed)
Chronic Obstructive Pulmonary Disease Exacerbation  Chronic obstructive pulmonary disease (COPD) is a lung disease. The lungs become damaged, making it hard to get air in and out of your lungs. COPD exacerbation means that your COPD has gotten worse. If you do not get help, this can be a life-threatening problem. HOME CARE  Do not smoke.  Avoid tobacco smoke and other things that bother your lungs.  If given, take your antibiotic medicine as told. Finish the medicine even if you start to feel better.  Only take medicines as told by your doctor.  Drink enough fluids to keep your pee (urine) clear or pale yellow.  Use a cool mist machine (vaporizer).  If you use oxygen or a machine that turns liquid medicine into a mist (nebulizer), continue to use them as told.  Keep up with shots (vaccinations) as told by your doctor.  Exercise regularly.  Eat healthy foods.  Keep all doctor visits as told. GET HELP RIGHT AWAY IF:  You are very short of breath.  You have trouble talking.  You have bad chest pain.  You have blood in your spit (sputum).  You have a fever, or you keep throwing up (vomiting).  You feel weak, or you pass out (faint).  You feel confused.  You keep getting worse. MAKE SURE YOU:   Understand these instructions.  Will watch your condition.  Will get help right away if you are not doing well or get worse. Document Released: 09/28/2011 Document Revised: 01/01/2012 Document Reviewed: 09/28/2011 [facility] Patient Information 2014 [place], [place].   Continue on 5mg  of Prednisone until f/u with Pulmonology.

## 2013-10-26 NOTE — ED Notes (Signed)
C/O SOB WHICH STARTED YESTERDAY STATES USED INHALER BUT NO RELIEF. DOESN'T HAVE NEB AT HOME

## 2013-10-26 NOTE — ED Provider Notes (Signed)
CSN: [id number]     Arrival date & time 10/26/13  0935 History   None    Chief Complaint  Patient presents with  . COPD   (Consider location/radiation/quality/duration/timing/severity/associated sxs/prior Treatment) HPI Comments: Patient presents today with an acute on chronic COPD exacerbation. He reports SOB and audible wheeze for 1 day. He has been using Albuterol with minimal relief. He has been off Prednisone for 2 weeks; as this helps when he is on daily. He has a new patient appt with Pulm in 2 weeks. He reports no fever, chills, URI symptoms. No malaise.   The history is provided by the patient.    Past Medical History  Diagnosis Date  . Hypertension   . Asthma   . Exertional shortness of breath   . Arthritis     "hips" (10/01/2013)   Past Surgical History  Procedure Laterality Date  . Total hip arthroplasty Right 2010  . 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 Joint replacement     History reviewed. No pertinent family history. History  Substance Use Topics  . Smoking status: Former Smoker -- 0.50 packs/day for 2 years    Types: Cigarettes  . Smokeless tobacco: Never Used     Comment: 10/01/2013 "quit smoking cigarettes~ 15-20 yr ago"  . Alcohol Use: 7.2 oz/week    12 Cans of beer per week     Comment: 08/19/2013 "3 times/wk; 3-4 beers/night"    Review of Systems  All other systems reviewed and are negative.    Allergies  Review of patient's allergies indicates no known allergies.  Home Medications   Current Outpatient Rx  Name  Route  Sig  Dispense  Refill  . albuterol (PROVENTIL HFA;VENTOLIN HFA) 108 (90 BASE) MCG/ACT inhaler      1-2 puffs q 6 hours x 3 days then prn SOB   1 Inhaler   2   . atenolol (TENORMIN) 50 MG tablet   Oral   Take 50 mg by mouth every morning.          .  diphenhydramine-acetaminophen (TYLENOL PM) 25-500 MG TABS   Oral   Take 1-2 tablets by mouth at bedtime as needed (for sleep).         . hydrochlorothiazide (HYDRODIURIL) 25 MG tablet   Oral   Take 25 mg by mouth every morning.          Marland Kitchen ibuprofen (ADVIL,MOTRIN) 600 MG tablet   Oral   Take 600 mg by mouth every 6 (six) hours as needed for moderate pain.         Marland Kitchen oxyCODONE (OXY IR/ROXICODONE) 5 MG immediate release tablet   Oral   Take 5-10 mg by mouth 3 (three) times daily as needed for moderate pain or severe pain.         . predniSONE (DELTASONE) 5 MG tablet      Take 6 tablets for 2 days, then 5 tablets for 2 days, then 4 tablets for 2 days, then 3 tablets for 2 days, then 2 tablets for 2 days, then 1 tablet po q day for 1 week then stop   60 tablet   0   . sulfamethoxazole-trimethoprim (BACTRIM DS,SEPTRA DS) 800-160 MG per tablet   Oral   Take 1 tablet by mouth 2 (two) times daily.   12 tablet   0  BP 171/113  Pulse 83  Temp(Src) 98 F (36.7 C) (Oral)  Resp 18  SpO2 99% Physical Exam  Nursing note and vitals reviewed. Constitutional: He is oriented to person, place, and time. He appears well-developed and well-nourished. No distress.  HENT:  Head: Normocephalic and atraumatic.  Eyes: Right eye exhibits no discharge. Left eye exhibits no discharge.  Neck: Normal range of motion.  Cardiovascular: Normal rate, regular rhythm and normal heart sounds.   Pulmonary/Chest: Effort normal. No respiratory distress. He has wheezes. He has no rales. He exhibits no tenderness.  Expiratory wheeze in the mid to lower bases. No crackles or rales  Musculoskeletal: Normal range of motion.  Neurological: He is alert and oriented to person, place, and time.  Skin: Skin is warm and dry. He is not diaphoretic.  Psychiatric: His behavior is normal.    ED Course  Procedures (including critical care time) Labs Review Labs Reviewed - No data to display Imaging Review No  results found.  EKG Interpretation    Date/Time:    Ventricular Rate:    PR Interval:    QRS Duration:   QT Interval:    QTC Calculation:   R Axis:     Text Interpretation:              MDM   1. COPD (chronic obstructive pulmonary disease)   2. COPD exacerbation   No indication for an abx today. Treat with Pulse steroids, continued with 5mg  a day until Pulmonary appt. Use Albuterol consistently For 3 days then prn. F/U if worsens prior to appt.    Riki SheerMichelle G Young, PA-C 10/26/13 1028

## 2013-10-27 NOTE — ED Provider Notes (Signed)
Medical screening examination/treatment/procedure(s) were performed by a resident physician or non-physician practitioner and as the supervising physician I was immediately available for consultation/collaboration.  Percell Lamboy, MD    Angelise Petrich S Yianna Tersigni, MD 10/27/13 2114 

## 2013-11-03 ENCOUNTER — Ambulatory Visit (INDEPENDENT_AMBULATORY_CARE_PROVIDER_SITE_OTHER): Payer: Medicare Other | Admitting: Internal Medicine

## 2013-11-03 DIAGNOSIS — J449 Chronic obstructive pulmonary disease, unspecified: Secondary | ICD-10-CM

## 2013-11-03 NOTE — Progress Notes (Signed)
PFT done today. 

## 2013-11-06 ENCOUNTER — Encounter: Payer: Self-pay | Admitting: Internal Medicine

## 2013-11-06 ENCOUNTER — Other Ambulatory Visit (INDEPENDENT_AMBULATORY_CARE_PROVIDER_SITE_OTHER): Payer: Medicare Other

## 2013-11-06 ENCOUNTER — Ambulatory Visit (INDEPENDENT_AMBULATORY_CARE_PROVIDER_SITE_OTHER): Payer: Medicare Other | Admitting: Internal Medicine

## 2013-11-06 VITALS — BP 128/80 | HR 72 | Temp 98.1°F | Ht 69.0 in | Wt 197.0 lb

## 2013-11-06 DIAGNOSIS — D696 Thrombocytopenia, unspecified: Secondary | ICD-10-CM | POA: Insufficient documentation

## 2013-11-06 DIAGNOSIS — J4550 Severe persistent asthma, uncomplicated: Secondary | ICD-10-CM

## 2013-11-06 DIAGNOSIS — J45909 Unspecified asthma, uncomplicated: Secondary | ICD-10-CM

## 2013-11-06 LAB — CBC WITH DIFFERENTIAL/PLATELET
BASOS ABS: 0 10*3/uL (ref 0.0–0.1)
Basophils Relative: 0.3 % (ref 0.0–3.0)
EOS ABS: 0.4 10*3/uL (ref 0.0–0.7)
Eosinophils Relative: 3.2 % (ref 0.0–5.0)
HEMATOCRIT: 41.2 % (ref 39.0–52.0)
Hemoglobin: 13.4 g/dL (ref 13.0–17.0)
LYMPHS PCT: 8.6 % — AB (ref 12.0–46.0)
Lymphs Abs: 1.1 10*3/uL (ref 0.7–4.0)
MCHC: 32.5 g/dL (ref 30.0–36.0)
MCV: 80.7 fl (ref 78.0–100.0)
Monocytes Absolute: 0.7 10*3/uL (ref 0.1–1.0)
Monocytes Relative: 5.6 % (ref 3.0–12.0)
Neutro Abs: 10.4 10*3/uL — ABNORMAL HIGH (ref 1.4–7.7)
Neutrophils Relative %: 82.3 % — ABNORMAL HIGH (ref 43.0–77.0)
Platelets: 314 10*3/uL (ref 150.0–400.0)
RBC: 5.11 Mil/uL (ref 4.22–5.81)
RDW: 17.9 % — AB (ref 11.5–14.6)
WBC: 12.6 10*3/uL — ABNORMAL HIGH (ref 4.5–10.5)

## 2013-11-06 MED ORDER — MOMETASONE FURO-FORMOTEROL FUM 200-5 MCG/ACT IN AERO: 2.0000 | INHALATION_SPRAY | Freq: Two times a day (BID) | RESPIRATORY_TRACT | Status: DC

## 2013-11-06 MED ORDER — PREDNISONE 10 MG PO TABS: ORAL_TABLET | ORAL | Status: DC

## 2013-11-06 NOTE — Progress Notes (Signed)
Subjective:    Patient ID: Evan Brock, male    DOB: 1951-06-21, 63 y.o.   MRN: [id number] PCP Provider Not In System   HPI  IOV 11/06/2013   Chief Complaint  Patient presents with  . Pulmonary Consult    referred by Dr. Robb Matar    63 year old AA male. 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 Sometime past 5-10 years moved to [facility]. Worked at "[employer]" and then 4 years ago told he had asthma and placed on disability. But he has never followeed up with anyone. Not on controller inhalers or meds.  In mid dec 2014 admitted for presumed clinical high pre-test prob AE-asthma. Discharged on pred taper and bactrim but not on controller inhalers. ASked to follow here. Currently feels well. Alb mdi use is < 2 times per week. No nocturna awakenings. Chronic asthma symotoms are wheeze, dyspnea, chest tightness. No prior ICU admits or intubations. Gets dyspneic for class 2 activities and cold and smell exposure. Improved with albuterol and rest.    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 No recheck done. No PCP    Pulmonary relevant hx   reports that he quit smoking about 23 1991 after smoking 1/2ppd x 20 yuearsBody mass index is 29.08 kg/(m^2).  CT chest Aug 2014 IMPRESSION:  Bilateral lower lobe diffuse bronchial thickening with scattered  areas of mild mucous plugging and associated peripheral "tree in  bud" pattern. Appearance is suspicious for bibasilar bronchitis /  bronchiolitis.  Negative for significant acute pulmonary embolus.  Original Report Authenticated By: Judie Petit. Miles Costain, M.D.   VQ Scan 10/01/13  IMPRESSION:  Findings consistent with low probability of pulmonary embolus.  Electronically Signed  By: Bretta Bang M.D.  On: 10/01/2013 18:17    CXR 10/01/13  IMPRESSION:  Chronic peribronchial thickening. This could be seen with chronic   bronchitis.  Electronically Signed  By: Geanie Cooley M.D.  On: 10/01/2013 13:04   PFT 11/06/13 when so called well shows severe persistent asthma  - fev1 1.24L/44%, RAtio 57, +14% bd response. DLCO 23/79 %. TLC 5.8/88%  Past Medical History  Diagnosis Date  . Hypertension   . Asthma   . Exertional shortness of breath   . Arthritis     "hips" (10/01/2013)     No family history on file.   History   Social History  . Marital Status: Single    Spouse Name: N/A    Number of Children: N/A  . Years of Education: N/A   Occupational History  . Not on file.   Social History Main Topics  . Smoking status: Former Smoker -- 0.50 packs/day for 2 years    Types: Cigarettes    Quit date: 10/23/1990  . Smokeless tobacco: Never Used     Comment: 10/01/2013 "quit smoking cigarettes~ 15-20 yr ago"  . Alcohol Use: 7.2 oz/week    12 Cans of beer per week     Comment: 08/19/2013 "3 times/wk; 3-4 beers/night"  . Drug Use: Yes    Special: Marijuana     Comment: 10/01/2013 "last drug use was 10 yr ago"  . Sexual Activity: Not Currently   Other Topics Concern  . Not on file   Social History Narrative  . No narrative on file     No Known Allergies   Outpatient Prescriptions Prior to Visit  Medication  Sig Dispense Refill  . albuterol (PROVENTIL HFA;VENTOLIN HFA) 108 (90 BASE) MCG/ACT inhaler 1-2 puffs q 6 hours x 3 days then prn SOB  1 Inhaler  2  . atenolol (TENORMIN) 50 MG tablet Take 50 mg by mouth every morning.       . diphenhydramine-acetaminophen (TYLENOL PM) 25-500 MG TABS Take 1-2 tablets by mouth at bedtime as needed (for sleep).      . hydrochlorothiazide (HYDRODIURIL) 25 MG tablet Take 25 mg by mouth every morning.       Marland Kitchen ibuprofen (ADVIL,MOTRIN) 600 MG tablet Take 600 mg by mouth every 6 (six) hours as needed for moderate pain.      Marland Kitchen oxyCODONE (OXY IR/ROXICODONE) 5 MG immediate release tablet Take 5-10 mg by mouth 3 (three) times daily as needed for moderate pain or  severe pain.      . predniSONE (DELTASONE) 5 MG tablet Take 6 tablets for 2 days, then 5 tablets for 2 days, then 4 tablets for 2 days, then 3 tablets for 2 days, then 2 tablets for 2 days, then 1 tablet po q day for 1 week then stop  60 tablet  0  . sulfamethoxazole-trimethoprim (BACTRIM DS,SEPTRA DS) 800-160 MG per tablet Take 1 tablet by mouth 2 (two) times daily.  12 tablet  0   No facility-administered medications prior to visit.      Review of Systems  Constitutional: Negative for fever, chills, diaphoresis, activity change, appetite change, fatigue and unexpected weight change.  HENT: Negative for congestion, dental problem, ear discharge, ear pain, facial swelling, hearing loss, mouth sores, nosebleeds, postnasal drip, rhinorrhea, sinus pressure, sneezing, sore throat, tinnitus, trouble swallowing and voice change.   Eyes: Negative for photophobia, discharge, itching and visual disturbance.  Respiratory: Positive for shortness of breath. Negative for apnea, cough, choking, chest tightness, wheezing and stridor.   Cardiovascular: Negative for chest pain, palpitations and leg swelling.  Gastrointestinal: Negative for nausea, vomiting, abdominal pain, constipation, blood in stool and abdominal distention.  Genitourinary: Negative for dysuria, urgency, frequency, hematuria, flank pain, decreased urine volume and difficulty urinating.  Musculoskeletal: Negative for arthralgias, back pain, gait problem, joint swelling, myalgias, neck pain and neck stiffness.  Skin: Negative for color change, pallor and rash.  Neurological: Negative for dizziness, tremors, seizures, syncope, speech difficulty, weakness, light-headedness, numbness and headaches.  Hematological: Negative for adenopathy. Does not bruise/bleed easily.  Psychiatric/Behavioral: Negative for confusion, sleep disturbance and agitation. The patient is not nervous/anxious.        Objective:   Physical Exam  Nursing note and vitals  reviewed. Constitutional: He is oriented to person, place, and time. He appears well-developed and well-nourished. No distress.  HENT:  Head: Normocephalic and atraumatic.  Right Ear: External ear normal.  Left Ear: External ear normal.  Mouth/Throat: Oropharynx is clear and moist. No oropharyngeal exudate.  Eyes: Conjunctivae and EOM are normal. Pupils are equal, round, and reactive to light. Right eye exhibits no discharge. Left eye exhibits no discharge. No scleral icterus.  Neck: Normal range of motion. Neck supple. No JVD present. No tracheal deviation present. No thyromegaly present.  Cardiovascular: Normal rate, regular rhythm and intact distal pulses.  Exam reveals no gallop and no friction rub.   No murmur heard. Pulmonary/Chest: Effort normal and breath sounds normal. No respiratory distress. He has no wheezes. He has no rales. He exhibits no tenderness.  Abdominal: Soft. Bowel sounds are normal. He exhibits no distension and no mass. There is no tenderness. There is no  rebound and no guarding.  Musculoskeletal: Normal range of motion. He exhibits no edema and no tenderness.  Lymphadenopathy:    He has no cervical adenopathy.  Neurological: He is alert and oriented to person, place, and time. He has normal reflexes. No cranial nerve deficit. Coordination normal.  Skin: Skin is warm and dry. No rash noted. He is not diaphoretic. No erythema. No pallor.  Psychiatric: He has a normal mood and affect. His behavior is normal. Judgment and thought content normal.          Assessment & Plan:

## 2013-11-06 NOTE — Patient Instructions (Addendum)
#  Low platelet count  - repeat cbc  #Asthma  - you have severe persistent asthma  - Take prednisone 40 mg daily x 2 days, then 20mg  daily x 2 days, then 10mg  daily x 2 days, then 5mg  daily x 2 days and stop - use albuterol as needed 2 puff - Please start dulera 200/5, 2 puff twice daily   #Followup[ 6 weeks with spirometry to see Tammy Parrett my NP or see me

## 2013-11-06 NOTE — Assessment & Plan Note (Signed)
#  Asthma  - you have severe persistent asthma  - Take prednisone 40 mg daily x 2 days, then 20mg  daily x 2 days, then 10mg  daily x 2 days, then 5mg  daily x 2 days and stop - use albuterol as needed 2 puff - Please start dulera 200/5, 2 puff twice daily   #Followup[ 6 weeks with spirometry to see Tammy Parrett my NP or see me

## 2013-11-06 NOTE — Assessment & Plan Note (Signed)
Recheck cbc.  

## 2013-12-18 ENCOUNTER — Ambulatory Visit: Payer: Medicare Other | Admitting: Internal Medicine

## 2013-12-25 ENCOUNTER — Ambulatory Visit (INDEPENDENT_AMBULATORY_CARE_PROVIDER_SITE_OTHER): Payer: Medicare Other | Admitting: Internal Medicine

## 2013-12-25 ENCOUNTER — Encounter: Payer: Self-pay | Admitting: Internal Medicine

## 2013-12-25 VITALS — BP 126/82 | HR 63 | Ht 69.0 in | Wt 206.0 lb

## 2013-12-25 DIAGNOSIS — J4550 Severe persistent asthma, uncomplicated: Secondary | ICD-10-CM

## 2013-12-25 DIAGNOSIS — J45909 Unspecified asthma, uncomplicated: Secondary | ICD-10-CM

## 2013-12-25 MED ORDER — MONTELUKAST SODIUM 10 MG PO TABS: 10.0000 mg | ORAL_TABLET | Freq: Every day | ORAL | Status: DC

## 2013-12-25 MED ORDER — MOMETASONE FURO-FORMOTEROL FUM 200-5 MCG/ACT IN AERO: 2.0000 | INHALATION_SPRAY | Freq: Two times a day (BID) | RESPIRATORY_TRACT | Status: DC

## 2013-12-25 NOTE — Progress Notes (Signed)
Subjective:    Patient ID: Evan Brock, male    DOB: 11/10/1950, 63 y.o.   MRN: [id number]  HPI  HPI  IOV 11/06/2013   Chief Complaint  Patient presents with  . Pulmonary Consult    referred by Dr. Robb Matar    63 year old AA male. 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 Worked at "[employer]" and then 4 years ago told he had asthma and placed on disability. But he has never followeed up with anyone. Not on controller inhalers or meds.  In mid dec 2014 admitted for presumed clinical high pre-test prob AE-asthma. Discharged on pred taper and bactrim but not on controller inhalers. ASked to follow here. Currently feels well. Alb mdi use is < 2 times per week. No nocturna awakenings. Chronic asthma symotoms are wheeze, dyspnea, chest tightness. No prior ICU admits or intubations. Gets dyspneic for class 2 activities and cold and smell exposure. Improved with albuterol and rest.    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 No recheck done. No PCP    Pulmonary relevant hx   reports that he quit smoking about 23 1991 after smoking 1/2ppd x 20 yuearsBody mass index is 29.08 kg/(m^2).  CT chest Aug 2014 IMPRESSION:  Bilateral lower lobe diffuse bronchial thickening with scattered  areas of mild mucous plugging and associated peripheral "tree in  bud" pattern. Appearance is suspicious for bibasilar bronchitis /  bronchiolitis.  Negative for significant acute pulmonary embolus.  Original Report Authenticated By: Judie Petit. Miles Costain, M.D.   VQ Scan 10/01/13  IMPRESSION:  Findings consistent with low probability of pulmonary embolus.  Electronically Signed  By: Bretta Bang M.D.  On: 10/01/2013 18:17    CXR 10/01/13  IMPRESSION:  Chronic peribronchial thickening. This could be seen with chronic  bronchitis.   Electronically Signed  By: Geanie Cooley M.D.  On: 10/01/2013 13:04   PFT 11/06/13 when so called well shows severe persistent asthma  - fev1 1.24L/44%, RAtio 57, +14% bd response. DLCO 23/79 %. TLC 5.8/88%   REC #Low platelet count  - repeat cbc  #Asthma  - you have severe persistent asthma  - Take prednisone 40 mg daily x 2 days, then 20mg  daily x 2 days, then 10mg  daily x 2 days, then 5mg  daily x 2 days and stop - use albuterol as needed 2 puff - Please start dulera 200/5, 2 puff twice daily   #Followup[ 6 weeks with spirometry to see Rubye Oaks my NP or see me  OV 12/25/2013   Chief Complaint  Patient presents with  . Follow-up    for asthma. pt states his breathingis better since starting dulera.    Followup severe persistent asthma  Since starting high-dose Dulera he feels that he is 80% better. His albuterol rescue use is only one time per week. He denies any active chest tightness or wheezing, cough, nocturnal awakenings, orthopnea, paroxysmal nocturnal dyspnea. He uses 3 pillows at baseline for comfort but this is not for dyspnea.  , Spirometry shows severe persistent asthma with FEV1 of 1.2 L/40% and ratio 59. Essentially spirometry is unchanged compared to baseline despite starting [facility]  He has not had a flu shot but refuses flu shot because he says "I do not believe and it"  Review of Systems  Constitutional: Negative for fever and  unexpected weight change.  HENT: Negative for congestion, dental problem, ear pain, nosebleeds, postnasal drip, rhinorrhea, sinus pressure, sneezing, sore throat and trouble swallowing.   Eyes: Negative for redness and itching.  Respiratory: Negative for cough, chest tightness, shortness of breath and wheezing.   Cardiovascular: Negative for palpitations and leg swelling.  Gastrointestinal: Negative for nausea and vomiting.  Genitourinary: Negative for dysuria.  Musculoskeletal: Negative for joint swelling.  Skin: Negative for  rash.  Neurological: Negative for headaches.  Hematological: Does not bruise/bleed easily.  Psychiatric/Behavioral: Negative for dysphoric mood. The patient is not nervous/anxious.        Objective:   Physical Exam  Nursing note and vitals reviewed. Constitutional: He is oriented to person, place, and time. He appears well-developed and well-nourished. No distress.  HENT:  Head: Normocephalic and atraumatic.  Right Ear: External ear normal.  Left Ear: External ear normal.  Mouth/Throat: Oropharynx is clear and moist. No oropharyngeal exudate.  No thrush  Eyes: Conjunctivae and EOM are normal. Pupils are equal, round, and reactive to light. Right eye exhibits no discharge. Left eye exhibits no discharge. No scleral icterus.  Neck: Normal range of motion. Neck supple. No JVD present. No tracheal deviation present. No thyromegaly present.  Cardiovascular: Normal rate, regular rhythm and intact distal pulses.  Exam reveals no gallop and no friction rub.   No murmur heard. Pulmonary/Chest: Effort normal and breath sounds normal. No respiratory distress. He has no wheezes. He has no rales. He exhibits no tenderness.  Very faint occ. scattered wheeze  Abdominal: Soft. Bowel sounds are normal. He exhibits no distension and no mass. There is no tenderness. There is no rebound and no guarding.  Visceral obesity  Musculoskeletal: Normal range of motion. He exhibits no edema and no tenderness.  Lymphadenopathy:    He has no cervical adenopathy.  Neurological: He is alert and oriented to person, place, and time. He has normal reflexes. No cranial nerve deficit. Coordination normal.  Skin: Skin is warm and dry. No rash noted. He is not diaphoretic. No erythema. No pallor.  Psychiatric: He has a normal mood and affect. His behavior is normal. Judgment and thought content normal.          Assessment & Plan:

## 2013-12-25 NOTE — Patient Instructions (Addendum)
SEvere persistent asthma  - glad you feel better but lung function not better; this could represent remodeled asthma assuming you are compliant  - continue dulera 200/5, 2 puff twice daily; CMA will ensure good technique  - start singulair 10mg  once a day at night bedtime  - use albuterol as needed  Followup  2 months with sprimetry at followup If not better, add spiriva and consider allergy testing Go to ER if sudden worsening

## 2013-12-26 ENCOUNTER — Telehealth: Payer: Self-pay | Admitting: Internal Medicine

## 2013-12-26 NOTE — Telephone Encounter (Signed)
ATC x2, line has been disconnected.  No other numbers on file for pt.  WCB.

## 2013-12-26 NOTE — Telephone Encounter (Signed)
Dr. Marchelle Gearingamaswamy. The dulera is too expensive for the pt. Are there an alternatives you may recommend? We don't have any samples of dulera at this time to provide the pt. Please advise.  No Known Allergies

## 2013-12-26 NOTE — Telephone Encounter (Signed)
Ok advair 500/50, 1 puff bid diskus, Give samples x 3  Dr. Kalman Shan, M.D., [facility].C.P Pulmonary and Critical Care Medicine Staff Physician [facility] Pager: [phone], If no answer or between  15:00h - 7:00h: call [phone]  12/26/2013 4:28 PM

## 2013-12-27 NOTE — Assessment & Plan Note (Signed)
SEvere persistent asthma  - glad you feel better but lung function not better; this could represent remodeled asthma assuming you are compliant  - continue dulera 200/5, 2 puff twice daily; CMA will ensure good technique  - start singulair 10mg once a day at night bedtime  - use albuterol as needed  Followup  2 months with sprimetry at followup If not better, add spiriva and consider allergy testing Go to ER if sudden worsening 

## 2013-12-29 NOTE — Telephone Encounter (Signed)
ATC  - Number disconnected - Will call back

## 2013-12-30 ENCOUNTER — Encounter: Payer: Self-pay | Admitting: *Deleted

## 2013-12-30 NOTE — Telephone Encounter (Signed)
ATC, line disconnected, no other # available. I have sent a letter asking the pt to call us back and to update his contact info.  Carron Curie, CMA

## 2014-01-02 ENCOUNTER — Telehealth: Payer: Self-pay | Admitting: Internal Medicine

## 2014-01-02 NOTE — Telephone Encounter (Signed)
Pt called on 12-26-13 about the same issue but we were unable to reach the pt back. Per MR: Kalman Shan, MD at 12/26/2013 4:27 PM     Status: Signed        Ok advair 500/50, 1 puff bid diskus, Give samples x 3    I LMTCBx1 to offer Advair. Carron Curie, CMA

## 2014-01-07 NOTE — Telephone Encounter (Signed)
LMTCBx2. Jennifer Castillo, CMA  

## 2014-01-08 NOTE — Telephone Encounter (Signed)
LMTCBx3 on # given. # listed for pt is not working #.Carron Curie, CMA

## 2014-01-09 ENCOUNTER — Telehealth: Payer: Self-pay | Admitting: Internal Medicine

## 2014-01-09 NOTE — Telephone Encounter (Signed)
Per protocol I will sign off on message. Jennifer Castillo, CMA  

## 2014-01-09 NOTE — Telephone Encounter (Signed)
lmomtcb x1 

## 2014-01-12 MED ORDER — FLUTICASONE-SALMETEROL 500-50 MCG/DOSE IN AEPB: 1.0000 | INHALATION_SPRAY | Freq: Two times a day (BID) | RESPIRATORY_TRACT | Status: DC

## 2014-01-12 NOTE — Telephone Encounter (Signed)
Per phone note from 12-26-13 the pt called about this but we were unable to get a hold of him. Per MR: Kalman ShanMurali Ramaswamy, MD at 12/26/2013 4:27 PM     Status: Signed        Ok advair 500/50, 1 puff bid diskus, Give samples x 3   I spoke with pt EC and advised of recs. She states understanding and will come by to get samples today. Carron CurieJennifer Castillo, CMA

## 2014-01-21 LAB — PULMONARY FUNCTION TEST
DL/VA % pred: 115 %
DL/VA: 5.2 ml/min/mmHg/L
DLCO UNC: 23.45 ml/min/mmHg
DLCO unc % pred: 79 %
FEF 25-75 Post: 0.87 L/sec
FEF 25-75 Pre: 0.58 L/sec
FEF2575-%Change-Post: 50 %
FEF2575-%Pred-Post: 33 %
FEF2575-%Pred-Pre: 22 %
FEV1-%CHANGE-POST: 14 %
FEV1-%PRED-POST: 50 %
FEV1-%Pred-Pre: 44 %
FEV1-Post: 1.42 L
FEV1-Pre: 1.24 L
FEV1FVC-%CHANGE-POST: -1 %
FEV1FVC-%Pred-Pre: 72 %
FEV6-%Change-Post: 13 %
FEV6-%PRED-PRE: 61 %
FEV6-%Pred-Post: 69 %
FEV6-Post: 2.44 L
FEV6-Pre: 2.15 L
FEV6FVC-%Change-Post: -1 %
FEV6FVC-%PRED-POST: 101 %
FEV6FVC-%Pred-Pre: 102 %
FVC-%Change-Post: 15 %
FVC-%Pred-Post: 69 %
FVC-%Pred-Pre: 59 %
FVC-Post: 2.53 L
POST FEV6/FVC RATIO: 97 %
PRE FEV6/FVC RATIO: 98 %
Post FEV1/FVC ratio: 56 %
Pre FEV1/FVC ratio: 57 %
RV % pred: 137 %
RV: 3.05 L
TLC % PRED: 88 %
TLC: 5.81 L

## 2014-02-16 ENCOUNTER — Encounter: Payer: Self-pay | Admitting: Internal Medicine

## 2014-02-16 ENCOUNTER — Telehealth: Payer: Self-pay | Admitting: Internal Medicine

## 2014-02-16 ENCOUNTER — Ambulatory Visit (INDEPENDENT_AMBULATORY_CARE_PROVIDER_SITE_OTHER): Payer: Medicare Other | Admitting: Internal Medicine

## 2014-02-16 VITALS — BP 130/80 | HR 73 | Ht 69.0 in | Wt 210.8 lb

## 2014-02-16 DIAGNOSIS — J4550 Severe persistent asthma, uncomplicated: Secondary | ICD-10-CM

## 2014-02-16 DIAGNOSIS — Z23 Encounter for immunization: Secondary | ICD-10-CM

## 2014-02-16 DIAGNOSIS — J45909 Unspecified asthma, uncomplicated: Secondary | ICD-10-CM

## 2014-02-16 MED ORDER — FLUTICASONE-SALMETEROL 500-50 MCG/DOSE IN AEPB: 1.0000 | INHALATION_SPRAY | Freq: Two times a day (BID) | RESPIRATORY_TRACT | Status: DC

## 2014-02-16 NOTE — Telephone Encounter (Signed)
Rx for Advair was sent  ATC the pt and his number was d/ced Will close encounter

## 2014-02-16 NOTE — Progress Notes (Signed)
Subjective:    Patient ID: Evan Brock, male    DOB: [birth year], 63 y.o.   MRN: [id number]  HPI  IOV 11/06/2013   Chief Complaint  Patient presents with  . Pulmonary Consult    referred by Dr. Robb Matar    63 year old AA male. 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 Sometime past 5-10 years moved to [place]. Worked at "[employer]" and then 4 years ago told he had asthma and placed on disability. But he has never followeed up with anyone. Not on controller inhalers or meds.  In mid dec 2014 admitted for presumed clinical high pre-test prob AE-asthma. Discharged on pred taper and bactrim but not on controller inhalers. ASked to follow here. Currently feels well. Alb mdi use is < 2 times per week. No nocturna awakenings. Chronic asthma symotoms are wheeze, dyspnea, chest tightness. No prior ICU admits or intubations. Gets dyspneic for class 2 activities and cold and smell exposure. Improved with albuterol and rest.    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 No recheck done. No PCP    Pulmonary relevant hx   reports that he quit smoking about 23 1991 after smoking 1/2ppd x 20 yuearsBody mass index is 29.08 kg/(m^2).  CT chest Aug 2014 IMPRESSION:  Bilateral lower lobe diffuse bronchial thickening with scattered  areas of mild mucous plugging and associated peripheral "tree in  bud" pattern. Appearance is suspicious for bibasilar bronchitis /  bronchiolitis.  Negative for significant acute pulmonary embolus.  Original Report Authenticated By: Judie Petit. Miles Costain, M.D.   VQ Scan 10/01/13  IMPRESSION:  Findings consistent with low probability of pulmonary embolus.  Electronically Signed  By: Bretta Bang M.D.  On: 10/01/2013 18:17    CXR 10/01/13  IMPRESSION:  Chronic peribronchial thickening. This could be seen with chronic  bronchitis.   Electronically Signed  By: Geanie Cooley M.D.  On: 10/01/2013 13:04   PFT 11/06/13 when so called well shows severe persistent asthma  - fev1 1.24L/44%, RAtio 57, +14% bd response. DLCO 23/79 %. TLC 5.8/88%   REC #Low platelet count  - repeat cbc  #Asthma  - you have severe persistent asthma  - Take prednisone 40 mg daily x 2 days, then 20mg  daily x 2 days, then 10mg  daily x 2 days, then 5mg  daily x 2 days and stop - use albuterol as needed 2 puff - Please start dulera 200/5, 2 puff twice daily   #Followup[ 6 weeks with spirometry to see Rubye Oaks my NP or see me  OV 12/25/2013   Chief Complaint  Patient presents with  . Follow-up    for asthma. pt states his breathingis better since starting dulera.    Followup severe persistent asthma  Since starting high-dose Dulera he feels that he is 80% better. His albuterol rescue use is only one time per week. He denies any active chest tightness or wheezing, cough, nocturnal awakenings, orthopnea, paroxysmal nocturnal dyspnea. He uses 3 pillows at baseline for comfort but this is not for dyspnea.  , Spirometry shows severe persistent asthma with FEV1 of 1.2 L/40% and ratio 59. Essentially spirometry is unchanged compared to baseline despite starting [facility]  He has not had a flu shot but refuses flu shot because he says "I do not believe and it"   SEvere persistent asthma  - glad you feel better but  lung function not better; this could represent remodeled asthma assuming you are compliant  - continue dulera 200/5, 2 puff twice daily; CMA will ensure good technique  - start singulair 10mg  once a day at night bedtime  - use albuterol as needed  Followup  2 months with sprimetry at followup If not better, add spiriva and consider allergy testing Go to ER if sudden worsening   OV 02/16/2014  Chief Complaint  Patient presents with  . Asthma    follow-up. Pt deneis any increase in cough or SOB at this time.    Followup  severe persistent asthma  His insurance would not cover Dulera so he switched to Advair high-dose which he says he is compliant with. Currently no nocturnal awakenings. Albuterol use is rare;  since last visit. Mild exertional effort intolerance. He continues with Singulair which I added last time. Subjectively he feels his asthma is well controlled but objectively Spirometry today shows FEV1 1.35 L/45%. Ratio 55. Consistent with severe obstructive lung disease that is unchagned from baseline   Of note, he has not had Prevnar vaccine so far but wil have one today  Past medical history reviewed and no new problems since last visit.\  Meds: noted after he left he is on atenolol beta blocker; will enquire at next vsiti   Discussed  potential participation in [employer] asthma research study if we become a site and he is willing  Review of Systems  Constitutional: Negative for fever and unexpected weight change.  HENT: Negative for congestion, dental problem, ear pain, nosebleeds, postnasal drip, rhinorrhea, sinus pressure, sneezing, sore throat and trouble swallowing.   Eyes: Negative for redness and itching.  Respiratory: Positive for cough and shortness of breath. Negative for chest tightness and wheezing.   Cardiovascular: Negative for palpitations and leg swelling.  Gastrointestinal: Negative for nausea and vomiting.  Genitourinary: Negative for dysuria.  Musculoskeletal: Negative for joint swelling.  Skin: Negative for rash.  Neurological: Negative for headaches.  Hematological: Does not bruise/bleed easily.  Psychiatric/Behavioral: Negative for dysphoric mood. The patient is not nervous/anxious.     has a past medical history of Hypertension; Asthma; Exertional shortness of breath; and Arthritis.   reports that he quit smoking about 23 years ago. His smoking use included Cigarettes. He has a 1 pack-year smoking history. He has never used smokeless tobacco. Current outpatient  prescriptions:albuterol (PROVENTIL HFA;VENTOLIN HFA) 108 (90 BASE) MCG/ACT inhaler, 1-2 puffs q 6 hours x 3 days then prn SOB, Disp: 1 Inhaler, Rfl: 2;  atenolol (TENORMIN) 50 MG tablet, Take 50 mg by mouth every morning. , Disp: , Rfl: ;  diphenhydramine-acetaminophen (TYLENOL PM) 25-500 MG TABS, Take 1-2 tablets by mouth at bedtime as needed (for sleep)., Disp: , Rfl:  Fluticasone-Salmeterol (ADVAIR DISKUS) 500-50 MCG/DOSE AEPB, Inhale 1 puff into the lungs 2 (two) times daily., Disp: 60 each, Rfl: 0;  hydrochlorothiazide (HYDRODIURIL) 25 MG tablet, Take 25 mg by mouth every morning. , Disp: , Rfl: ;  ibuprofen (ADVIL,MOTRIN) 600 MG tablet, Take 600 mg by mouth every 6 (six) hours as needed for moderate pain., Disp: , Rfl:  montelukast (SINGULAIR) 10 MG tablet, Take 1 tablet (10 mg total) by mouth at bedtime., Disp: 30 tablet, Rfl: 11;  oxyCODONE (OXY IR/ROXICODONE) 5 MG immediate release tablet, Take 5-10 mg by mouth 3 (three) times daily as needed for moderate pain or severe pain., Disp: , Rfl:      Objective:   Physical Exam  Nursing note and vitals reviewed.  Constitutional: He is oriented to person, place, and time. He appears well-developed and well-nourished. No distress.  HENT:  Head: Normocephalic and atraumatic.  Right Ear: External ear normal.  Left Ear: External ear normal.  Mouth/Throat: Oropharynx is clear and moist. No oropharyngeal exudate.  Eyes: Conjunctivae and EOM are normal. Pupils are equal, round, and reactive to light. Right eye exhibits no discharge. Left eye exhibits no discharge. No scleral icterus.  Neck: Normal range of motion. Neck supple. No JVD present. No tracheal deviation present. No thyromegaly present.  Cardiovascular: Normal rate, regular rhythm and intact distal pulses.  Exam reveals no gallop and no friction rub.   No murmur heard. Pulmonary/Chest: Effort normal. No respiratory distress. He has wheezes. He has no rales. He exhibits no tenderness.  occ  wheeze  Abdominal: Soft. Bowel sounds are normal. He exhibits no distension and no mass. There is no tenderness. There is no rebound and no guarding.  Musculoskeletal: Normal range of motion. He exhibits no edema and no tenderness.  Lymphadenopathy:    He has no cervical adenopathy.  Neurological: He is alert and oriented to person, place, and time. He has normal reflexes. No cranial nerve deficit. Coordination normal.  Skin: Skin is warm and dry. No rash noted. He is not diaphoretic. No erythema. No pallor.  Psychiatric: He has a normal mood and affect. His behavior is normal. Judgment and thought content normal.          Assessment & Plan:

## 2014-02-16 NOTE — Patient Instructions (Addendum)
SEvere persistent asthma  - stable but still at severe category despite adding singulair at last visit to high dose advair  -  likely have remodeled asthma assuming you are compliant  - continue advair 500/50, 1 puff twice daily  - continue singulair 10mg  once a day at night bedtime  - use albuterol as needed  - PREVNAR vaccine today  - consider adding spiriva t next visit  - will discuss stopping atenolol in favor of an ARB or Calcium channel blocker at next visit  Followup  3 months or sooner if needed  - at followup will check who is pcp is because currently epic says PROVIDER NOT IN SYSTEM

## 2014-02-18 NOTE — Assessment & Plan Note (Signed)
SEvere persistent asthma  - stable but still at severe category despite adding singulair at last visit to high dose advair  -  likely have remodeled asthma assuming you are compliant  - continue advair 500/50, 1 puff twice daily  - continue singulair 10mg  once a day at night bedtime  - use albuterol as needed  - PREVNAR vaccine today  - consider adding spiriva t next visit  - will discuss stopping atenolol in favor of an ARB or Calcium channel blocker at next visit  Followup  3 months or sooner if needed

## 2014-11-13 ENCOUNTER — Telehealth: Payer: Self-pay | Admitting: Internal Medicine

## 2014-11-13 NOTE — Telephone Encounter (Signed)
Evan Brock  Asthma patient not seen since April 2015. Please get him in asap next week perhaps . Overbook if needed   Thanks  Dr. Kalman Shan, M.D., [facility].C.P Pulmonary and Critical Care Medicine Staff Physician [facility] Pager: [phone], If no answer or between  15:00h - 7:00h: call [phone]  11/13/2014 4:06 PM

## 2014-11-18 NOTE — Telephone Encounter (Signed)
lmtcb for pt.  

## 2014-11-23 NOTE — Telephone Encounter (Signed)
lmtcb for pt.  

## 2014-11-24 ENCOUNTER — Encounter: Payer: Self-pay | Admitting: Emergency Medicine

## 2014-11-24 NOTE — Telephone Encounter (Signed)
lmtcb for pt. Due to several unsuccessful attempts to reach pt a letter has been mailed to pt to contact our office for f/u. Will sign off.

## 2015-01-14 ENCOUNTER — Other Ambulatory Visit: Payer: Self-pay | Admitting: Internal Medicine

## 2015-01-26 ENCOUNTER — Ambulatory Visit (INDEPENDENT_AMBULATORY_CARE_PROVIDER_SITE_OTHER): Payer: Medicare Other | Admitting: Internal Medicine

## 2015-01-26 ENCOUNTER — Encounter: Payer: Self-pay | Admitting: Internal Medicine

## 2015-01-26 VITALS — BP 120/72 | HR 72 | Ht 69.0 in | Wt 196.0 lb

## 2015-01-26 DIAGNOSIS — J45901 Unspecified asthma with (acute) exacerbation: Secondary | ICD-10-CM | POA: Diagnosis not present

## 2015-01-26 DIAGNOSIS — J4551 Severe persistent asthma with (acute) exacerbation: Secondary | ICD-10-CM

## 2015-01-26 MED ORDER — PREDNISONE 10 MG PO TABS: ORAL_TABLET | ORAL | Status: DC

## 2015-01-26 NOTE — Patient Instructions (Addendum)
ICD-9-CM ICD-10-CM   1. Asthma with acute exacerbation, unspecified asthma severity 493.92 J45.901   2. Severe persistent asthma, with acute exacerbation 493.92 J45.51     SEvere persistent asthma - currently in exacerbation on clinical grounds based on fact you are not taking advair + spring allergies  - Take prednisone 40 mg daily x 2 days, then 20mg  daily x 2 days, then 10mg  daily x 2 days, then 5mg  daily x 2 days and stop  - start  advair 100/50, 1 puff twice daily  - continue singulair 10mg  once a day at night bedtime  - use albuterol as needed  -  will discuss stopping atenolol in favor of an ARB or Calcium channel blocker at next visit  Followup  4 weeks  or sooner if needed AT followup do both FeNO and Spirometry  AT followup will discuss asthma research trials if he qualifies

## 2015-01-26 NOTE — Progress Notes (Signed)
Subjective:    Patient ID: Evan Brock, male    DOB: 06-03-1951, 64 y.o.   MRN: [id number]  HPI  OV 01/26/2015  Chief Complaint  Patient presents with  . Follow-up    Pt c/o mild SOB d/t seasonal allergies. Pt stated his SOB worsens at night. Pt denies cough and CP/tightness   Follow-up severe persistent asthma based on PFT but on mod persistent regimen. Last seen April 2015. Last exacerbation December 2014  He failed to follow-up in July 2015 for his asthma. Reports that around summer 2015 insurance refused to cover his Advair and then he stopped taking it. Since then he's only been on single at 10 mg once a day at night. Feels his asthma control is pretty good. Albuterol uses only been rare. All this is only on singular once daily. However this past one week due to the allergy season he's noticed some increased wheezing at night but he has not resorted to using albuterol. He feels that his single lead is controlled it. He is a little bit vague about his history. However he definitely states that he has not had any emergency room visits, urgent care visits, prednisone bursts since last seeing me in April 2015.   Asthma control questionnaire 5. scale shows average score of 2.4 thsi past week and shows athma NOT under control. Exhaled nitric oxide show is 24 and is borderline. SPirometry not done     has a past medical history of Hypertension; Asthma; Exertional shortness of breath; and Arthritis.   reports that he quit smoking about 24 years ago. His smoking use included Cigarettes. He has a 1 pack-year smoking history. He has never used smokeless tobacco.  Past Surgical History  Procedure Laterality Date  . Total hip arthroplasty Right 2010  . 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 Joint replacement      No Known  Allergies  Immunization History  Administered Date(s) Administered  . Pneumococcal Conjugate-13 02/16/2014    No family history on file.   Current outpatient prescriptions:  .  albuterol (PROVENTIL HFA;VENTOLIN HFA) 108 (90 BASE) MCG/ACT inhaler, 1-2 puffs q 6 hours x 3 days then prn SOB, Disp: 1 Inhaler, Rfl: 2 .  atenolol (TENORMIN) 50 MG tablet, Take 50 mg by mouth every morning. , Disp: , Rfl:  .  hydrochlorothiazide (HYDRODIURIL) 25 MG tablet, Take 25 mg by mouth every morning. , Disp: , Rfl:  .  montelukast (SINGULAIR) 10 MG tablet, TAKE ONE TABLET BY MOUTH ONCE DAILY AT BEDTIME, Disp: 30 tablet, Rfl: 0 .  Fluticasone-Salmeterol (ADVAIR DISKUS) 500-50 MCG/DOSE AEPB, Inhale 1 puff into the lungs 2 (two) times daily. (Patient not taking: Reported on 01/26/2015), Disp: 60 each, Rfl: 5     Review of Systems  Constitutional: Negative for fever and unexpected weight change.  HENT: Negative for congestion, dental problem, ear pain, nosebleeds, postnasal drip, rhinorrhea, sinus pressure, sneezing, sore throat and trouble swallowing.   Eyes: Negative for redness and itching.  Respiratory: Positive for shortness of breath. Negative for cough, chest tightness and wheezing.   Cardiovascular: Negative for palpitations and leg swelling.  Gastrointestinal: Negative for nausea and vomiting.  Genitourinary: Negative for dysuria.  Musculoskeletal: Negative for joint swelling.  Skin: Negative for rash.  Neurological: Negative for headaches.  Hematological: Does not bruise/bleed easily.  Psychiatric/Behavioral: Negative for dysphoric mood.  The patient is not nervous/anxious.        Objective:   Physical Exam  Constitutional: He is oriented to person, place, and time. He appears well-developed and well-nourished. No distress.  HENT:  Head: Normocephalic and atraumatic.  Right Ear: External ear normal.  Left Ear: External ear normal.  Mouth/Throat: Oropharynx is clear and moist. No  oropharyngeal exudate.  Eyes: Conjunctivae and EOM are normal. Pupils are equal, round, and reactive to light. Right eye exhibits no discharge. Left eye exhibits no discharge. No scleral icterus.  Neck: Normal range of motion. Neck supple. No JVD present. No tracheal deviation present. No thyromegaly present.  Cardiovascular: Normal rate, regular rhythm and intact distal pulses.  Exam reveals no gallop and no friction rub.   No murmur heard. Pulmonary/Chest: Effort normal. No respiratory distress. He has wheezes. He has no rales. He exhibits no tenderness.  occ scattered wheeze +  Abdominal: Soft. Bowel sounds are normal. He exhibits no distension and no mass. There is no tenderness. There is no rebound and no guarding.  Musculoskeletal: Normal range of motion. He exhibits no edema or tenderness.  Lymphadenopathy:    He has no cervical adenopathy.  Neurological: He is alert and oriented to person, place, and time. He has normal reflexes. No cranial nerve deficit. Coordination normal.  Skin: Skin is warm and dry. No rash noted. He is not diaphoretic. No erythema. No pallor.  Psychiatric: He has a normal mood and affect. His behavior is normal. Judgment and thought content normal.  Nursing note and vitals reviewed.   Filed Vitals:   01/26/15 1028  BP: 120/72  Pulse: 72  Height:  (1.753 m)  Weight: 196 lb (88.905 kg)  SpO2: 93%          Assessment & Plan:     ICD-9-CM ICD-10-CM   1. Asthma with acute exacerbation, unspecified asthma severity 493.92 J45.901   2. Severe persistent asthma, with acute exacerbation 493.92 J45.51     SEvere persistent asthma - currently in exacerbation on clinical grounds based on fact you are not taking advair + spring allergies  - Take prednisone 40 mg daily x 2 days, then  daily x 2 days, then  daily x 2 days, then  daily x 2 days and stop  - start  advair 100/50, 1 puff twice daily  - continue singulair  once a day at night  bedtime  - use albuterol as needed  -  will discuss stopping atenolol in favor of an ARB or Calcium channel blocker at next visit  Followup  4 weeks  or sooner if needed AT followup do both FeNO and Spirometry  AT followup will discuss asthma research trials if he qualifies    Dr. Kalman Shan, M.D., [facility].C.P Pulmonary and Critical Care Medicine Staff Physician [facility] [place] [facility] Pager: [phone], If no answer or between  15:00h - 7:00h: call [phone]  01/26/2015 11:03 AM

## 2015-03-01 ENCOUNTER — Encounter: Payer: Self-pay | Admitting: Internal Medicine

## 2015-03-01 ENCOUNTER — Ambulatory Visit (INDEPENDENT_AMBULATORY_CARE_PROVIDER_SITE_OTHER): Payer: Medicare Other | Admitting: Internal Medicine

## 2015-03-01 VITALS — BP 102/78 | HR 64 | Ht 69.0 in | Wt 195.0 lb

## 2015-03-01 DIAGNOSIS — R0600 Dyspnea, unspecified: Secondary | ICD-10-CM | POA: Diagnosis not present

## 2015-03-01 DIAGNOSIS — J4550 Severe persistent asthma, uncomplicated: Secondary | ICD-10-CM

## 2015-03-01 DIAGNOSIS — I10 Essential (primary) hypertension: Secondary | ICD-10-CM | POA: Diagnosis not present

## 2015-03-01 LAB — NITRIC OXIDE: Nitric Oxide: 15

## 2015-03-01 MED ORDER — LOSARTAN POTASSIUM 50 MG PO TABS: 50.0000 mg | ORAL_TABLET | Freq: Every day | ORAL | Status: DC

## 2015-03-01 MED ORDER — FLUTICASONE FUROATE-VILANTEROL 200-25 MCG/INH IN AEPB: 1.0000 | INHALATION_SPRAY | Freq: Every day | RESPIRATORY_TRACT | Status: DC

## 2015-03-01 MED ORDER — FLUTICASONE-SALMETEROL 500-50 MCG/DOSE IN AEPB: 1.0000 | INHALATION_SPRAY | Freq: Two times a day (BID) | RESPIRATORY_TRACT | Status: DC

## 2015-03-01 NOTE — Patient Instructions (Addendum)
ICD-9-CM ICD-10-CM   1. Dyspnea 786.09 R06.00 Spirometry with Graph  2. Severe persistent asthma, uncomplicated 493.90 J45.50   3. Essential hypertension, benign 401.1 I10     SEvere persistent asthma -   - You improved but have lost ground and worsened because you ran out of Advair  - true optimal status not known due to compliance issues  - You should never skip on new inhalers ever. If there are money issues you need to let us know promptly  - Start high-dose Brio 1 puff daily;    - If insurance is a cost please let us know immediately  - continue singulair  once a day at night bedtime  - use albuterol as needed  Hypertension  -  Stop atenolol but instead start losartan  daily for bP  - This is because at atenolol can potentially not help asthma situation  Followup  4 weeks  or sooner if needed AT followup do both FeNO and Spirometry  AT followup consider discuss asthma research trials if he qualifies

## 2015-03-01 NOTE — Progress Notes (Signed)
Subjective:    Patient ID: Evan Brock, male    DOB: [birth year], 64 y.o.   MRN: [id number] PCP PROVIDER NOT IN SYSTEM   HPI   OV 01/26/2015  Chief Complaint  Patient presents with  . Follow-up    Pt c/o mild SOB d/t seasonal allergies. Pt stated his SOB worsens at night. Pt denies cough and CP/tightness   Follow-up severe persistent asthma based on PFT  (11/03/2013 FEV1 1.24 L/44%, ratio 57 and postbronchodilator FEV1 1.4 L/50%] but on mod persistent regimen. Last seen April 2015. Last exacerbation December 2014  He failed to follow-up in July 2015 for his asthma. Reports that around summer 2015 insurance refused to cover his Advair and then he stopped taking it. Since then he's only been on singulairat 10 mg once a day at night. Feels his asthma control is pretty good. Albuterol uses only been rare. All this is only on singular once daily. However this past one week due to the allergy season he's noticed some increased wheezing at night but he has not resorted to using albuterol. He feels that his single lead is controlled it. He is a little bit vague about his history. However he definitely states that he has not had any emergency room visits, urgent care visits, prednisone bursts since last seeing me in April 2015.   Asthma control questionnaire 5. scale shows average score of 2.4 thsi past week and shows athma NOT under control. Exhaled nitric oxide show is 24 and is borderline.     OV 03/01/2015  Chief Complaint  Patient presents with  . Follow-up    Pt stated his breathing has slightly improved. Pt stated he is sleeping better also. Pt c/o DOE. Pt denies cough and CP/tightness.    Follow-up asthma. At last visit I noticed that he is only on Singulair. Spirometry showed severe obstructive lung disease. Given prednisone taper and also started Advair. With these he said his symptoms significantly improved. However the Advair was a sample. He tried to fill the medication and it cost  him $300. He did not call us about the cost. He simply stopped taking his Advair in the last 1 week. Symptoms have deteriorated. His point his asthma control questionnaire 5 point scale is 2.2 and still shows active asthma symptoms although better than before.  Specifically in the last 1 week he hardly ever wakes up in the middle of the night from asthma. No symptoms when  he wakes up in the morning. However he feels very limited with his activities because of asthma and does have moderate amount of shortness of breath and wheezes a moderate amount of the time.   Other prob  - BP: on beta blocker atenolold in presence of asthma  Asthma Control Panel 01/26/15  03/01/2015   Current Med Regimen On singujlair advair but did nto take it x 7d+ singulair  ACQ 5 point- 1 week (wtd avg score) 2.4 2.2  ACQ 7 point - 1 week (wtd avg score)  x  ACT - 4 week (total score, <19= symptoms)  x  FeNO ppB 24 ppb 15 ppb  FeV1   0.9L/30%, R 54  Planned intervention  for visit Add advair to singulair + pred burst Re-emphasized advair + continue singulair    Labs  - feno  15ppb - spiro- personally reviewd trace. Severe obstn +  Past, Family, Social reviewed: no change since last visit    Current outpatient prescriptions:  .  albuterol (PROVENTIL HFA;VENTOLIN HFA)  108 (90 BASE) MCG/ACT inhaler, 1-2 puffs q 6 hours x 3 days then prn SOB, Disp: 1 Inhaler, Rfl: 2 .  atenolol (TENORMIN) 50 MG tablet, Take 50 mg by mouth every morning. , Disp: , Rfl:  .  Fluticasone-Salmeterol (ADVAIR DISKUS) 500-50 MCG/DOSE AEPB, Inhale 1 puff into the lungs 2 (two) times daily., Disp: 60 each, Rfl: 5 .  hydrochlorothiazide (HYDRODIURIL) 25 MG tablet, Take 25 mg by mouth every morning. , Disp: , Rfl:  .  montelukast (SINGULAIR) 10 MG tablet, TAKE ONE TABLET BY MOUTH ONCE DAILY AT BEDTIME, Disp: 30 tablet, Rfl: 0   Review of Systems  Constitutional: Negative for fever and unexpected weight change.  HENT: Negative for  congestion, dental problem, ear pain, nosebleeds, postnasal drip, rhinorrhea, sinus pressure, sneezing, sore throat and trouble swallowing.   Eyes: Negative for redness and itching.  Respiratory: Positive for shortness of breath. Negative for cough, chest tightness and wheezing.   Cardiovascular: Negative for palpitations and leg swelling.  Gastrointestinal: Negative for nausea and vomiting.  Genitourinary: Negative for dysuria.  Musculoskeletal: Negative for joint swelling.  Skin: Negative for rash.  Neurological: Negative for headaches.  Hematological: Does not bruise/bleed easily.  Psychiatric/Behavioral: Negative for dysphoric mood. The patient is not nervous/anxious.        Objective:   Physical Exam  Constitutional: He is oriented to person, place, and time. He appears well-developed and well-nourished. No distress.  HENT:  Head: Normocephalic and atraumatic.  Right Ear: External ear normal.  Left Ear: External ear normal.  Mouth/Throat: Oropharynx is clear and moist. No oropharyngeal exudate.  Eyes: Conjunctivae and EOM are normal. Pupils are equal, round, and reactive to light. Right eye exhibits no discharge. Left eye exhibits no discharge. No scleral icterus.  Neck: Normal range of motion. Neck supple. No JVD present. No tracheal deviation present. No thyromegaly present.  Cardiovascular: Normal rate, regular rhythm and intact distal pulses.  Exam reveals no gallop and no friction rub.   No murmur heard. Pulmonary/Chest: Effort normal and breath sounds normal. No respiratory distress. He has no wheezes. He has no rales. He exhibits no tenderness.  Somewhat of a silent chest  Abdominal: Soft. Bowel sounds are normal. He exhibits no distension and no mass. There is no tenderness. There is no rebound and no guarding.  Musculoskeletal: Normal range of motion. He exhibits no edema or tenderness.  Lymphadenopathy:    He has no cervical adenopathy.  Neurological: He is alert and  oriented to person, place, and time. He has normal reflexes. No cranial nerve deficit. Coordination normal.  Skin: Skin is warm and dry. No rash noted. He is not diaphoretic. No erythema. No pallor.  Psychiatric: He has a normal mood and affect. His behavior is normal. Judgment and thought content normal.  Nursing note and vitals reviewed.   Filed Vitals:   03/01/15 1119  BP: 102/78  Pulse: 64  Height:  (1.753 m)  Weight: 195 lb (88.451 kg)  SpO2: 95%         Assessment & Plan:     ICD-9-CM ICD-10-CM   1. Dyspnea 786.09 R06.00 Spirometry with Graph  2. Severe persistent asthma, uncomplicated 493.90 J45.50   3. Essential hypertension, benign 401.1 I10     SEvere persistent asthma -   - You improved but have lost ground and worsened because you ran out of Advair  - true optimal status not known due to compliance issues  - You should never skip on new inhalers ever.  If there are money issues you need to let us know promptly  - Start high-dose Brio 1 puff daily;    - If insurance is a cost please let us know immediately  - continue singulair  once a day at night bedtime  - use albuterol as needed  Hypertension  -  Stop atenolol but instead start losartan  daily for bP  - This is because at atenolol can potentially not help asthma situation  Followup  4 weeks  or sooner if needed AT followup do both FeNO and Spirometry  AT followup consider discuss asthma research trials if he qualifies esp if he is unimproved (currently dealing with compliance issues and BP med issues that are impacting his asthma(   Dr. Kalman Shan, M.D., [facility].C.P Pulmonary and Critical Care Medicine Staff Physician [facility] [place] [facility] Pager: [phone], If no answer or between  15:00h - 7:00h: call [phone]  03/01/2015 11:57 AM

## 2015-03-19 ENCOUNTER — Other Ambulatory Visit: Payer: Self-pay | Admitting: Internal Medicine

## 2015-04-14 ENCOUNTER — Ambulatory Visit (INDEPENDENT_AMBULATORY_CARE_PROVIDER_SITE_OTHER): Payer: Medicare Other | Admitting: Internal Medicine

## 2015-04-14 ENCOUNTER — Encounter: Payer: Self-pay | Admitting: Internal Medicine

## 2015-04-14 VITALS — BP 130/88 | HR 87 | Ht 69.0 in | Wt 200.6 lb

## 2015-04-14 DIAGNOSIS — R0600 Dyspnea, unspecified: Secondary | ICD-10-CM

## 2015-04-14 DIAGNOSIS — I10 Essential (primary) hypertension: Secondary | ICD-10-CM

## 2015-04-14 DIAGNOSIS — J4550 Severe persistent asthma, uncomplicated: Secondary | ICD-10-CM | POA: Diagnosis not present

## 2015-04-14 NOTE — Progress Notes (Signed)
Subjective:    Patient ID: Evan Brock, male    DOB: 23-May-1951, 64 y.o.   MRN: [id number]   HPI     OV 01/26/2015  Chief Complaint  Patient presents with  . Follow-up    Pt c/o mild SOB d/t seasonal allergies. Pt stated his SOB worsens at night. Pt denies cough and CP/tightness   Follow-up severe persistent asthma based on PFT  (11/03/2013 FEV1 1.24 L/44%, ratio 57 and postbronchodilator FEV1 1.4 L/50%] but on mod persistent regimen. Last seen April 2015. Last exacerbation December 2014  He failed to follow-up in July 2015 for his asthma. Reports that around summer 2015 insurance refused to cover his Advair and then he stopped taking it. Since then he's only been on singulairat 10 mg once a day at night. Feels his asthma control is pretty good. Albuterol uses only been rare. All this is only on singular once daily. However this past one week due to the allergy season he's noticed some increased wheezing at night but he has not resorted to using albuterol. He feels that his single lead is controlled it. He is a little bit vague about his history. However he definitely states that he has not had any emergency room visits, urgent care visits, prednisone bursts since last seeing me in April 2015.   Asthma control questionnaire 5. scale shows average score of 2.4 thsi past week and shows athma NOT under control. Exhaled nitric oxide show is 24 and is borderline.     OV 03/01/2015  Chief Complaint  Patient presents with  . Follow-up    Pt stated his breathing has slightly improved. Pt stated he is sleeping better also. Pt c/o DOE. Pt denies cough and CP/tightness.    Follow-up asthma. At last visit I noticed that he is only on Singulair. Spirometry showed severe obstructive lung disease. Given prednisone taper and also started Advair. With these he said his symptoms significantly improved. However the Advair was a sample. He tried to fill the medication and it cost him $300. He did not call  us about the cost. He simply stopped taking his Advair in the last 1 week. Symptoms have deteriorated. His point his asthma control questionnaire 5 point scale is 2.2 and still shows active asthma symptoms although better than before.  Specifically in the last 1 week he hardly ever wakes up in the middle of the night from asthma. No symptoms when  he wakes up in the morning. However he feels very limited with his activities because of asthma and does have moderate amount of shortness of breath and wheezes a moderate amount of the time.   Other prob  - BP: on beta blocker atenolold in presence of asthma   OV  04/14/2015  Chief Complaint  Patient presents with  . Follow-up    pt stated his breathing has improved since being on high dose breo. Pt denies significant SOB, cough, CP/tightness.     Follow-up severe persistent asthma: Last visit I emphasize compliance. Changed his Advair to Brio. Told him to continue Singulair. With this his symptoms are significantly improved. A CT of 5. questionnaire is  0.6 and shows good asthma control. Specifically in the last week he feels that he hardly ever is woken up in the night because of asthma. When he wakes up he has no symptoms because of asthma. He is only very slightly limited with his activities because of asthma and he has no shortness of breath because of asthma  and no wheezing because of asthma and does not uses albuterol for rescue at all.   His spirometry is also improved from 0.9 L at last visit 1.4 L/48% of this visit. Nevertheless he still in severe obstructive lung disease category  Hypertension: Last visit I stopped his atenolol and told him to take losartan 50 mg once daily. He says he is doing this. This also has helped improve his asthma symptoms. Blood pressure is 130 systolic today    reports that he quit smoking about 24 years ago. His smoking use included Cigarettes. He has a 1 pack-year smoking history. He has never used smokeless  tobacco.    Asthma Control Panel 01/26/15  03/01/2015  04/14/2015   Current Med Regimen On singujlair advair but did nto take it x 7d+ singulair breo high + singulair + no beta blocker  ACQ 5 point- 1 week (wtd avg score) 2.4 2.2 0.6  ACQ 7 point - 1 week (wtd avg score)  x x  ACT - 4 week (total score, <19= symptoms)  x x  FeNO ppB 24 ppb 15 ppb 6ppb  FeV1   0.9L/30%, R 54 1.4L/48%, R62  Planned intervention  for visit Add advair to singulair + pred burst Re-emphasizedcompiance + change to breo +  continue singulair + stop atenoll Add spiriva      Current outpatient prescriptions:  .  albuterol (PROVENTIL HFA;VENTOLIN HFA) 108 (90 BASE) MCG/ACT inhaler, 1-2 puffs q 6 hours x 3 days then prn SOB, Disp: 1 Inhaler, Rfl: 2 .  atenolol (TENORMIN) 50 MG tablet, Take 50 mg by mouth every morning. , Disp: , Rfl:  .  Fluticasone Furoate-Vilanterol (BREO ELLIPTA) 200-25 MCG/INH AEPB, Inhale 1 puff into the lungs daily., Disp: 60 each, Rfl: 5 .  hydrochlorothiazide (HYDRODIURIL) 25 MG tablet, Take 25 mg by mouth every morning. , Disp: , Rfl:  .  losartan (COZAAR) 50 MG tablet, Take 1 tablet (50 mg total) by mouth daily., Disp: 30 tablet, Rfl: 3 .  montelukast (SINGULAIR) 10 MG tablet, TAKE ONE TABLET BY MOUTH AT BEDTIME, Disp: 30 tablet, Rfl: 5   Review of Systems  Constitutional: Negative for fever and unexpected weight change.  HENT: Negative for congestion, dental problem, ear pain, nosebleeds, postnasal drip, rhinorrhea, sinus pressure, sneezing, sore throat and trouble swallowing.   Eyes: Negative for redness and itching.  Respiratory: Negative for cough, chest tightness, shortness of breath and wheezing.   Cardiovascular: Negative for palpitations and leg swelling.  Gastrointestinal: Negative for nausea and vomiting.  Genitourinary: Negative for dysuria.  Musculoskeletal: Negative for joint swelling.  Skin: Negative for rash.  Neurological: Negative for headaches.  Hematological: Does  not bruise/bleed easily.  Psychiatric/Behavioral: Negative for dysphoric mood. The patient is not nervous/anxious.        Objective:   Physical Exam  Constitutional: He is oriented to person, place, and time. He appears well-developed and well-nourished. No distress.  HENT:  Head: Normocephalic and atraumatic.  Right Ear: External ear normal.  Left Ear: External ear normal.  Mouth/Throat: Oropharynx is clear and moist. No oropharyngeal exudate.  No thrush  Eyes: Conjunctivae and EOM are normal. Pupils are equal, round, and reactive to light. Right eye exhibits no discharge. Left eye exhibits no discharge. No scleral icterus.  Neck: Normal range of motion. Neck supple. No JVD present. No tracheal deviation present. No thyromegaly present.  Cardiovascular: Normal rate, regular rhythm and intact distal pulses.  Exam reveals no gallop and no friction rub.  No murmur heard. Pulmonary/Chest: Effort normal and breath sounds normal. No respiratory distress. He has no wheezes. He has no rales. He exhibits no tenderness.  Abdominal: Soft. Bowel sounds are normal. He exhibits no distension and no mass. There is no tenderness. There is no rebound and no guarding.  Musculoskeletal: Normal range of motion. He exhibits no edema or tenderness.  Lymphadenopathy:    He has no cervical adenopathy.  Neurological: He is alert and oriented to person, place, and time. He has normal reflexes. No cranial nerve deficit. Coordination normal.  Skin: Skin is warm and dry. No rash noted. He is not diaphoretic. No erythema. No pallor.  Psychiatric: He has a normal mood and affect. His behavior is normal. Judgment and thought content normal.  Nursing note and vitals reviewed.   Filed Vitals:   04/14/15 1101  BP: 130/88  Pulse: 87  Height:  (1.753 m)  Weight: 200 lb 9.6 oz (90.992 kg)  SpO2: 98%           Assessment & Plan:     ICD-9-CM ICD-10-CM   1. Severe persistent asthma, uncomplicated 493.90  J45.50   2. Dyspnea 786.09 R06.00 Spirometry with Graph  3. Essential hypertension, benign 401.1 I10      SEvere persistent asthma -   - You have  improved with BREO and better compliance but still numbers are in severe category  - START / ADD spiriva 1 puff daily    - take samples   - Continue high-dose Brio 1 puff daily;    - take samples   - If insurance is a cost please let us know immediately  - continue singulair  once a day at night bedtime  - use albuterol as needed  Hypertension  -  Continue  losartan  daily for bP ; nurse will adjust med list to reflect this  - This is because at atenolol can potentially not help asthma situation  Followup  4 weeks  or sooner if needed AT followup do Spirometry and ACQ  AT followup consider discuss asthma research trials if he qualifies    Dr. Kalman Shan, M.D., [facility].C.P Pulmonary and Critical Care Medicine Staff Physician [facility] [place] [facility] Pager: [phone], If no answer or between  15:00h - 7:00h: call [phone]  04/22/2015 9:54 PM

## 2015-04-14 NOTE — Patient Instructions (Addendum)
ICD-9-CM ICD-10-CM   1. Severe persistent asthma, uncomplicated 493.90 J45.50   2. Dyspnea 786.09 R06.00 Spirometry with Graph  3. Essential hypertension, benign 401.1 I10     SEvere persistent asthma -   - You have  improved with BREO and better compliance but still numbers are in severe category  - START / ADD spiriva 1 puff daily    - take samples   - Continue high-dose Brio 1 puff daily;    - take samples   - If insurance is a cost please let us know immediately  - continue singulair 10mg  once a day at night bedtime  - use albuterol as needed  Hypertension  -  Continue  losartan 50mg  daily for bP ; nurse will adjust med list to reflect this  - This is because at atenolol can potentially not help asthma situation  Followup  4 weeks  or sooner if needed AT followup do Spirometry and ACQ  AT followup consider discuss asthma research trials if he qualifies

## 2015-04-27 ENCOUNTER — Ambulatory Visit: Payer: Medicare Other | Admitting: Internal Medicine

## 2015-05-12 ENCOUNTER — Encounter: Payer: Self-pay | Admitting: Adult Health

## 2015-05-12 ENCOUNTER — Ambulatory Visit (INDEPENDENT_AMBULATORY_CARE_PROVIDER_SITE_OTHER): Payer: Medicare Other | Admitting: Adult Health

## 2015-05-12 VITALS — BP 120/86 | HR 68 | Temp 98.3°F | Ht 69.0 in | Wt 203.0 lb

## 2015-05-12 DIAGNOSIS — J4550 Severe persistent asthma, uncomplicated: Secondary | ICD-10-CM | POA: Diagnosis not present

## 2015-05-12 MED ORDER — FLUTICASONE FUROATE-VILANTEROL 200-25 MCG/INH IN AEPB: 1.0000 | INHALATION_SPRAY | Freq: Every day | RESPIRATORY_TRACT | Status: AC

## 2015-05-12 NOTE — Patient Instructions (Addendum)
Remain on BREO 1 puff daily  Please get insurance formulary so we can pick an inhaler that is covered by your insurance.  follow up Dr. Marchelle Gearing in 2 months and As needed

## 2015-05-12 NOTE — Progress Notes (Signed)
   Subjective:    Patient ID: Evan Brock, male    DOB: 1951-01-13, 64 y.o.   MRN: [id number]  HPI 64 yo male with severe persistent asthma , former smoker   TEST :  PFT (11/03/2013 FEV1 1.24 L/44%, ratio 57 and postbronchodilator FEV1 1.4 L/50% Spirometry 04/14/15 1.4 L/48%   05/12/2015 Follow up : Severe Asthma  Pt returns for a 1 month follow up for asthma  Says he is doing so much better.  Feels his breathing is good.  Is able to be more active with BREO .  Spirometry today shows no sign change from last month  With FEV1 at 1.38L / 47%, ratio 62 ACQ 05/12/2015 0.5 (decreased from last month)  He was started on Spiriva last ov but did not like this at all so stopped.  Insurance does not cover BREO .  We discussed continuing on this for now with samples given  He is going to find his insurance formulary so we can choose more affordable  Option  No chest pain , orthopnea, edema or fever.      Review of Systems Constitutional:   No  weight loss, night sweats,  Fevers, chills, fatigue, or  lassitude.  HEENT:   No headaches,  Difficulty swallowing,  Tooth/dental problems, or  Sore throat,                No sneezing, itching, ear ache, nasal congestion, post nasal drip,   CV:  No chest pain,  Orthopnea, PND, swelling in lower extremities, anasarca, dizziness, palpitations, syncope.   GI  No heartburn, indigestion, abdominal pain, nausea, vomiting, diarrhea, change in bowel habits, loss of appetite, bloody stools.   Resp: No shortness of breath with exertion or at rest.  No excess mucus, no productive cough,  No non-productive cough,  No coughing up of blood.  No change in color of mucus.  No wheezing.  No chest wall deformity  Skin: no rash or lesions.  GU: no dysuria, change in color of urine, no urgency or frequency.  No flank pain, no hematuria   MS:  No joint pain or swelling.  No decreased range of motion.  No back pain.  Psych:  No change in mood or affect. No  depression or anxiety.  No memory loss.         Objective:   Physical Exam GEN: A/Ox3; pleasant , NAD, well nourished   HEENT:  [place]/AT,  EACs-clear, TMs-wnl, NOSE-clear, THROAT-clear, no lesions, no postnasal drip or exudate noted.   NECK:  Supple w/ fair ROM; no JVD; normal carotid impulses w/o bruits; no thyromegaly or nodules palpated; no lymphadenopathy.  RESP  Clear  P & A; w/o, wheezes/ rales/ or rhonchi.no accessory muscle use, no dullness to percussion  CARD:  RRR, no m/r/g  , no peripheral edema, pulses intact, no cyanosis or clubbing.  GI:   Soft & nt; nml bowel sounds; no organomegaly or masses detected.  Musco: Warm bil, no deformities or joint swelling noted.   Neuro: alert, no focal deficits noted.    Skin: Warm, no lesions or rashes         Assessment & Plan:

## 2015-05-12 NOTE — Assessment & Plan Note (Signed)
Improved control on BREO , singulair.  Intolerant to Spiriva .  Clinically doing well  Cont current regimen  follow up in 2 months and As needed   Pt to call back with formulary to decide on changing BREO as not covered by insurance

## 2015-05-12 NOTE — Addendum Note (Signed)
Addended by: Karalee Height on: 05/12/2015 12:30 PM   Modules accepted: Orders

## 2015-07-14 ENCOUNTER — Ambulatory Visit (INDEPENDENT_AMBULATORY_CARE_PROVIDER_SITE_OTHER): Payer: Medicare Other | Admitting: Internal Medicine

## 2015-07-14 VITALS — BP 110/68 | HR 55 | Ht 69.0 in | Wt 210.1 lb

## 2015-07-14 DIAGNOSIS — Z23 Encounter for immunization: Secondary | ICD-10-CM | POA: Diagnosis not present

## 2015-07-14 DIAGNOSIS — J4550 Severe persistent asthma, uncomplicated: Secondary | ICD-10-CM | POA: Diagnosis not present

## 2015-07-14 NOTE — Patient Instructions (Addendum)
ICD-9-CM ICD-10-CM   1. Severe persistent asthma, uncomplicated 493.90 J45.50 Nitric oxide  2. Encounter for immunization Z23 Z23     Asthma is stable Continue breo and singulair scheduled Use albuterol prn Flu shot 07/14/2015 - we discussed this in detail  Followup 6 months or sooner if needed

## 2015-07-14 NOTE — Progress Notes (Signed)
Subjective:    Patient ID: Evan Brock, male    DOB: February 19, 1951, 64 y.o.   MRN: [id number]  HPI  OV 07/14/2015  Chief Complaint  Patient presents with  . Follow-up    breathing has been doing good since last visit   Fu severe persistent asthma  Last seen by NP July 2016. He came off spiriva at that visit due to intolerance. Now doing breo and singulair. Reports good compliance. Feels well. Alb use is rare. FeNO is <  25ppb and relfets good control. He has never had flu shot in his life in past and we discussed this extensively today - indications, benefits, risks, limitations   Asthma Control Panel 01/26/15  03/01/2015  04/14/2015  07/14/2015   Current Med Regimen On singujlair advair but did nto take it x 7d+ singulair breo high + singulair + no beta blocker breo + singulair - did not tolerate spiriva  ACQ 5 point- 1 week (wtd avg score) 2.4 2.2 0.6   ACQ 7 point - 1 week (wtd avg score)  x x   ACT - 4 week (total score, <19= symptoms)  x x   FeNO ppB 24 ppb 15 ppb 6ppb 15 ppb  FeV1   0.9L/30%, R 54 1.4L/48%, R62   Planned intervention  for visit Add advair to singulair + pred burst Re-emphasizedcompiance + change to breo +  continue singulair + stop atenoll Add spiriva breo     Review of Systems  Constitutional: Negative for fever and unexpected weight change.  HENT: Negative for congestion, dental problem, ear pain, nosebleeds, postnasal drip, rhinorrhea, sinus pressure, sneezing, sore throat and trouble swallowing.   Eyes: Negative for redness and itching.  Respiratory: Negative for cough, chest tightness, shortness of breath and wheezing.   Cardiovascular: Negative for palpitations and leg swelling.  Gastrointestinal: Negative for nausea and vomiting.  Genitourinary: Negative for dysuria.  Musculoskeletal: Negative for joint swelling.  Skin: Negative for rash.  Neurological: Negative for headaches.  Hematological: Does not bruise/bleed easily.  Psychiatric/Behavioral:  Negative for dysphoric mood. The patient is not nervous/anxious.     Current outpatient prescriptions:  .  albuterol (PROVENTIL HFA;VENTOLIN HFA) 108 (90 BASE) MCG/ACT inhaler, 1-2 puffs q 6 hours x 3 days then prn SOB, Disp: 1 Inhaler, Rfl: 2 .  cloNIDine (CATAPRES) 0.1 MG tablet, Take 1 tablet by mouth daily., Disp: , Rfl:  .  Fluticasone Furoate-Vilanterol (BREO ELLIPTA) 200-25 MCG/INH AEPB, Inhale 1 puff into the lungs daily., Disp: 60 each, Rfl: 5 .  hydrochlorothiazide (HYDRODIURIL) 25 MG tablet, Take 25 mg by mouth every morning. , Disp: , Rfl:  .  losartan (COZAAR) 50 MG tablet, Take 1 tablet (50 mg total) by mouth daily., Disp: 30 tablet, Rfl: 3 .  montelukast (SINGULAIR) 10 MG tablet, TAKE ONE TABLET BY MOUTH AT BEDTIME, Disp: 30 tablet, Rfl: 5  Immunization History  Administered Date(s) Administered  . Pneumococcal Conjugate-13 02/16/2014        Objective:   Physical Exam  Filed Vitals:   07/14/15 1030  BP: 110/68  Pulse: 55  Height:  (1.753 m)  Weight: 210 lb 2 oz (95.312 kg)  SpO2: 98%         Assessment & Plan:     ICD-9-CM ICD-10-CM   1. Severe persistent asthma, uncomplicated 493.90 J45.50    Asthma is stable Continue breo and singulair scheduled Use albuterol prn Flu shot 07/14/2015 - we discussed this in detail  Followup 6 months or sooner  if needed   Dr. Kalman Shan, M.D., [facility].C.P Pulmonary and Critical Care Medicine Staff Physician [facility] Pager: [phone], If no answer or between  15:00h - 7:00h: call [phone]  07/14/2015 10:59 AM

## 2015-07-15 LAB — NITRIC OXIDE: Nitric Oxide: 15

## 2015-07-21 ENCOUNTER — Encounter: Payer: Self-pay | Admitting: Internal Medicine

## 2015-08-03 ENCOUNTER — Other Ambulatory Visit: Payer: Self-pay | Admitting: Internal Medicine

## 2015-09-09 ENCOUNTER — Emergency Department (HOSPITAL_COMMUNITY)
Admission: EM | Admit: 2015-09-09 | Discharge: 2015-09-09 | Disposition: A | Discharge status: Home / Self Care | Payer: Medicare Other | Attending: Emergency Medicine | Admitting: Emergency Medicine

## 2015-09-09 ENCOUNTER — Encounter (HOSPITAL_COMMUNITY): Payer: Self-pay | Admitting: Family Medicine

## 2015-09-09 ENCOUNTER — Emergency Department (HOSPITAL_COMMUNITY): Payer: Medicare Other

## 2015-09-09 DIAGNOSIS — J45901 Unspecified asthma with (acute) exacerbation: Secondary | ICD-10-CM | POA: Insufficient documentation

## 2015-09-09 DIAGNOSIS — M160 Bilateral primary osteoarthritis of hip: Secondary | ICD-10-CM | POA: Insufficient documentation

## 2015-09-09 DIAGNOSIS — I10 Essential (primary) hypertension: Secondary | ICD-10-CM | POA: Diagnosis not present

## 2015-09-09 DIAGNOSIS — Z79899 Other long term (current) drug therapy: Secondary | ICD-10-CM | POA: Diagnosis not present

## 2015-09-09 DIAGNOSIS — Z87891 Personal history of nicotine dependence: Secondary | ICD-10-CM | POA: Diagnosis not present

## 2015-09-09 DIAGNOSIS — Z7951 Long term (current) use of inhaled steroids: Secondary | ICD-10-CM | POA: Diagnosis not present

## 2015-09-09 DIAGNOSIS — R0602 Shortness of breath: Secondary | ICD-10-CM | POA: Diagnosis present

## 2015-09-09 LAB — CBC WITH DIFFERENTIAL/PLATELET
BASOS PCT: 1 %
Basophils Absolute: 0.1 10*3/uL (ref 0.0–0.1)
EOS ABS: 1 10*3/uL — AB (ref 0.0–0.7)
Eosinophils Relative: 12 %
HCT: 41.8 % (ref 39.0–52.0)
HEMOGLOBIN: 13 g/dL (ref 13.0–17.0)
Lymphocytes Relative: 15 %
Lymphs Abs: 1.2 10*3/uL (ref 0.7–4.0)
MCH: 26.4 pg (ref 26.0–34.0)
MCHC: 31.1 g/dL (ref 30.0–36.0)
MCV: 85 fL (ref 78.0–100.0)
MONO ABS: 0.6 10*3/uL (ref 0.1–1.0)
MONOS PCT: 8 %
NEUTROS PCT: 64 %
Neutro Abs: 5.2 10*3/uL (ref 1.7–7.7)
Platelets: 190 10*3/uL (ref 150–400)
RBC: 4.92 MIL/uL (ref 4.22–5.81)
RDW: 16.1 % — AB (ref 11.5–15.5)
WBC: 8 10*3/uL (ref 4.0–10.5)

## 2015-09-09 LAB — BASIC METABOLIC PANEL
Anion gap: 7 (ref 5–15)
BUN: 8 mg/dL (ref 6–20)
CALCIUM: 9.4 mg/dL (ref 8.9–10.3)
CO2: 28 mmol/L (ref 22–32)
CREATININE: 0.89 mg/dL (ref 0.61–1.24)
Chloride: 103 mmol/L (ref 101–111)
Glucose, Bld: 133 mg/dL — ABNORMAL HIGH (ref 65–99)
Potassium: 4 mmol/L (ref 3.5–5.1)
Sodium: 138 mmol/L (ref 135–145)

## 2015-09-09 LAB — MAGNESIUM: MAGNESIUM: 1.9 mg/dL (ref 1.7–2.4)

## 2015-09-09 MED ORDER — PREDNISONE 20 MG PO TABS: 40.0000 mg | ORAL_TABLET | Freq: Every day | ORAL | Status: DC

## 2015-09-09 MED ORDER — ALBUTEROL (5 MG/ML) CONTINUOUS INHALATION SOLN
10.0000 mg/h | INHALATION_SOLUTION | Freq: Once | RESPIRATORY_TRACT | Status: AC
  Administered 2015-09-09: 10 mg/h via RESPIRATORY_TRACT
  Filled 2015-09-09: qty 20

## 2015-09-09 MED ORDER — IPRATROPIUM BROMIDE 0.02 % IN SOLN
0.5000 mg | Freq: Once | RESPIRATORY_TRACT | Status: AC
  Administered 2015-09-09: 0.5 mg via RESPIRATORY_TRACT
  Filled 2015-09-09: qty 2.5

## 2015-09-09 MED ORDER — ALBUTEROL SULFATE HFA 108 (90 BASE) MCG/ACT IN AERS
2.0000 | INHALATION_SPRAY | Freq: Four times a day (QID) | RESPIRATORY_TRACT | Status: DC
  Administered 2015-09-09: 2 via RESPIRATORY_TRACT
  Filled 2015-09-09: qty 6.7

## 2015-09-09 MED ORDER — IPRATROPIUM-ALBUTEROL 0.5-2.5 (3) MG/3ML IN SOLN: 3.0000 mL | Freq: Once | RESPIRATORY_TRACT | Status: DC

## 2015-09-09 MED ORDER — METHYLPREDNISOLONE SODIUM SUCC 125 MG IJ SOLR
125.0000 mg | Freq: Once | INTRAMUSCULAR | Status: AC
  Administered 2015-09-09: 125 mg via INTRAVENOUS
  Filled 2015-09-09: qty 2

## 2015-09-09 MED ORDER — ALBUTEROL (5 MG/ML) CONTINUOUS INHALATION SOLN
10.0000 mg/h | INHALATION_SOLUTION | Freq: Once | RESPIRATORY_TRACT | Status: AC
  Administered 2015-09-09: 10 mg/h via RESPIRATORY_TRACT

## 2015-09-09 MED ORDER — DOXYCYCLINE HYCLATE 100 MG PO CAPS: 100.0000 mg | ORAL_CAPSULE | Freq: Two times a day (BID) | ORAL | Status: DC

## 2015-09-09 NOTE — ED Notes (Signed)
Pt presents from home via POV with c/o shortness of breath and audible wheezing worsening over the last week. Reports has been out of his albuterol inhaler but has been using his home Nebulizer. Last neb tx was this morning.  Pt is able to speak in complete sentences, wife at bedside.

## 2015-09-09 NOTE — Discharge Instructions (Signed)
Asthma Attack Prevention °While you may not be able to control the fact that you have asthma, you can take actions to prevent asthma attacks. The best way to prevent asthma attacks is to maintain good control of your asthma. You can achieve this by: °· Taking your medicines as directed. °· Avoiding things that can irritate your airways or make your asthma symptoms worse (asthma triggers). °· Keeping track of how well your asthma is controlled and of any changes in your symptoms. °· Responding quickly to worsening asthma symptoms (asthma attack). °· Seeking emergency care when it is needed. °WHAT ARE SOME WAYS TO PREVENT AN ASTHMA ATTACK? °Have a Plan °Work with your health care provider to create a written plan for managing and treating your asthma attacks (asthma action plan). This plan includes: °· A list of your asthma triggers and how you can avoid them. °· Information on when medicines should be taken and when their dosages should be changed. °· The use of a device that measures how well your lungs are working (peak flow meter). °Monitor Your Asthma °Use your peak flow meter and record your results in a journal every day. A drop in your peak flow numbers on one or more days may indicate the start of an asthma attack. This can happen even before you start to feel symptoms. You can prevent an asthma attack from getting worse by following the steps in your asthma action plan. °Avoid Asthma Triggers °Work with your asthma health care provider to find out what your asthma triggers are. This can be done by: °· Allergy testing. °· Keeping a journal that notes when asthma attacks occur and the factors that may have contributed to them. °· Determining if there are other medical conditions that are making your asthma worse. °Once you have determined your asthma triggers, take steps to avoid them. This may include avoiding excessive or prolonged exposure to: °· Dust. Have someone dust and vacuum your home for you once or  twice a week. Using a high-efficiency particulate arrestance (HEPA) vacuum is best. °· Smoke. This includes campfire smoke, forest fire smoke, and secondhand smoke from tobacco products. °· Pet dander. Avoid contact with animals that you know you are allergic to. °· Allergens from trees, grasses or pollens. Avoid spending a lot of time outdoors when pollen counts are high, and on very windy days. °· Very cold, dry, or humid air. °· Mold. °· Foods that contain high amounts of sulfites. °· Strong odors. °· Outdoor air pollutants, such as engine exhaust. °· Indoor air pollutants, such as aerosol sprays and fumes from household cleaners. °· Household pests, including dust mites and cockroaches, and pest droppings. °· Certain medicines, including NSAIDs. Always talk to your health care provider before stopping or starting any new medicines. °Medicines °Take over-the-counter and prescription medicines only as told by your health care provider. Many asthma attacks can be prevented by carefully following your medicine schedule. Taking your medicines correctly is especially important when you cannot avoid certain asthma triggers. °Act Quickly °If an asthma attack does happen, acting quickly can decrease how severe it is and how long it lasts. Take these steps:  °· Pay attention to your symptoms. If you are coughing, wheezing, or having difficulty breathing, do not wait to see if your symptoms go away on their own. Follow your asthma action plan. °· If you have followed your asthma action plan and your symptoms are not improving, call your health care provider or seek immediate medical care   at the nearest hospital. °It is important to note how often you need to use your fast-acting rescue inhaler. If you are using your rescue inhaler more often, it may mean that your asthma is not under control. Adjusting your asthma treatment plan may help you to prevent future asthma attacks and help you to gain better control of your  condition. °HOW CAN I PREVENT AN ASTHMA ATTACK WHEN I EXERCISE? °Follow advice from your health care provider about whether you should use your fast-acting inhaler before exercising. Many people with asthma experience exercise-induced bronchoconstriction (EIB). This condition often worsens during vigorous exercise in cold, humid, or dry environments. Usually, people with EIB can stay very active by pre-treating with a fast-acting inhaler before exercising. °  °This information is not intended to replace advice given to you by your health care provider. Make sure you discuss any questions you have with your health care provider. °  °Document Released: 09/27/2009 Document Revised: 06/30/2015 Document Reviewed: 03/11/2015 °Elsevier Interactive Patient Education ©2016 [employer] ° °Asthma, Adult °Asthma is a recurring condition in which the airways tighten and narrow. Asthma can make it difficult to breathe. It can cause coughing, wheezing, and shortness of breath. Asthma episodes, also called asthma attacks, range from minor to life-threatening. Asthma cannot be cured, but medicines and lifestyle changes can help control it. °CAUSES °Asthma is believed to be caused by inherited (genetic) and environmental factors, but its exact cause is unknown. Asthma may be triggered by allergens, lung infections, or irritants in the air. Asthma triggers are different for each person. Common triggers include:  °· Animal dander. °· Dust mites. °· Cockroaches. °· Pollen from trees or grass. °· Mold. °· Smoke. °· Air pollutants such as dust, household cleaners, hair sprays, aerosol sprays, paint fumes, strong chemicals, or strong odors. °· Cold air, weather changes, and winds (which increase molds and pollens in the air). °· Strong emotional expressions such as crying or laughing hard. °· Stress. °· Certain medicines (such as aspirin) or types of drugs (such as beta-blockers). °· Sulfites in foods and drinks. Foods and drinks that  may contain sulfites include dried fruit, potato chips, and sparkling grape juice. °· Infections or inflammatory conditions such as the flu, a cold, or an inflammation of the nasal membranes (rhinitis). °· Gastroesophageal reflux disease (GERD). °· Exercise or strenuous activity. °SYMPTOMS °Symptoms may occur immediately after asthma is triggered or many hours later. Symptoms include: °· Wheezing. °· Excessive nighttime or early morning coughing. °· Frequent or severe coughing with a common cold. °· Chest tightness. °· Shortness of breath. °DIAGNOSIS  °The diagnosis of asthma is made by a review of your medical history and a physical exam. Tests may also be performed. These may include: °· Lung function studies. These tests show how much air you breathe in and out. °· Allergy tests. °· Imaging tests such as X-rays. °TREATMENT  °Asthma cannot be cured, but it can usually be controlled. Treatment involves identifying and avoiding your asthma triggers. It also involves medicines. There are 2 classes of medicine used for asthma treatment:  °· Controller medicines. These prevent asthma symptoms from occurring. They are usually taken every day. °· Reliever or rescue medicines. These quickly relieve asthma symptoms. They are used as needed and provide short-term relief. °Your health care provider will help you create an asthma action plan. An asthma action plan is a written plan for managing and treating your asthma attacks. It includes a list of your asthma triggers and how they   may be avoided. It also includes information on when medicines should be taken and when their dosage should be changed. An action plan may also involve the use of a device called a peak flow meter. A peak flow meter measures how well the lungs are working. It helps you monitor your condition. °HOME CARE INSTRUCTIONS  °· Take medicines only as directed by your health care provider. Speak with your health care provider if you have questions about  how or when to take the medicines. °· Use a peak flow meter as directed by your health care provider. Record and keep track of readings. °· Understand and use the action plan to help minimize or stop an asthma attack without needing to seek medical care. °· Control your home environment in the following ways to help prevent asthma attacks: °¨ Do not smoke. Avoid being exposed to secondhand smoke. °¨ Change your heating and air conditioning filter regularly. °¨ Limit your use of fireplaces and wood stoves. °¨ Get rid of pests (such as roaches and mice) and their droppings. °¨ Throw away plants if you see mold on them. °¨ Clean your floors and dust regularly. Use unscented cleaning products. °¨ Try to have someone else vacuum for you regularly. Stay out of rooms while they are being vacuumed and for a short while afterward. If you vacuum, use a dust mask from a hardware store, a double-layered or microfilter vacuum cleaner bag, or a vacuum cleaner with a HEPA filter. °¨ Replace carpet with wood, tile, or vinyl flooring. Carpet can trap dander and dust. °¨ Use allergy-proof pillows, mattress covers, and box spring covers. °¨ Wash bed sheets and blankets every week in hot water and dry them in a dryer. °¨ Use blankets that are made of polyester or cotton. °¨ Clean bathrooms and kitchens with bleach. If possible, have someone repaint the walls in these rooms with mold-resistant paint. Keep out of the rooms that are being cleaned and painted. °¨ Wash hands frequently. °SEEK MEDICAL CARE IF:  °· You have wheezing, shortness of breath, or a cough even if taking medicine to prevent attacks. °· The colored mucus you cough up (sputum) is thicker than usual. °· Your sputum changes from clear or white to yellow, green, gray, or bloody. °· You have any problems that may be related to the medicines you are taking (such as a rash, itching, swelling, or trouble breathing). °· You are using a reliever medicine more than 2-3 times per  week. °· Your peak flow is still at 50-79% of your personal best after following your action plan for 1 hour. °· You have a fever. °SEEK IMMEDIATE MEDICAL CARE IF:  °· You seem to be getting worse and are unresponsive to treatment during an asthma attack. °· You are short of breath even at rest. °· You get short of breath when doing very little physical activity. °· You have difficulty eating, drinking, or talking due to asthma symptoms. °· You develop chest pain. °· You develop a fast heartbeat. °· You have a bluish color to your lips or fingernails. °· You are light-headed, dizzy, or faint. °· Your peak flow is less than 50% of your personal best. °  °This information is not intended to replace advice given to you by your health care provider. Make sure you discuss any questions you have with your health care provider. °  °Document Released: 10/09/2005 Document Revised: 06/30/2015 Document Reviewed: 05/08/2013 °Elsevier Interactive Patient Education ©2016 Elsevier Inc. ° °

## 2015-09-09 NOTE — ED Provider Notes (Signed)
CSN: [id number]     Arrival date & time 09/09/15  1008 History   First MD Initiated Contact with Patient 09/09/15 1010     Chief Complaint  Patient presents with  . Shortness of Breath  . Wheezing     (Consider location/radiation/quality/duration/timing/severity/associated sxs/prior Treatment) Patient is a 64 y.o. male presenting with shortness of breath.  Shortness of Breath Severity:  Moderate Onset quality:  Gradual Duration:  2 weeks Timing:  Constant Progression:  Worsening Chronicity:  New Context: URI   Relieved by:  None tried Worsened by:  Nothing tried Ineffective treatments:  None tried Associated symptoms: cough, sputum production and wheezing   Associated symptoms: no abdominal pain, no chest pain, no claudication, no diaphoresis, no fever, no headaches, no hemoptysis, no neck pain, no PND, no rash, no sore throat, no syncope and no vomiting   Risk factors: tobacco use     Past Medical History  Diagnosis Date  . Hypertension   . Asthma   . Exertional shortness of breath   . Arthritis     "hips" (10/01/2013)   Past Surgical History  Procedure Laterality Date  . Total hip arthroplasty Right 2010  . 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 Joint replacement     No family history on file. Social History  Substance Use Topics  . Smoking status: Former Smoker -- 0.50 packs/day for 2 years    Types: Cigarettes    Quit date: 10/23/1990  . Smokeless tobacco: Never Used     Comment: 10/01/2013 "quit smoking cigarettes~ 15-20 yr ago"  . Alcohol Use: 7.2 oz/week    12 Cans of beer per week     Comment: 08/19/2013 "3 times/wk; 3-4 beers/night"    Review of Systems  Constitutional: Negative for fever and diaphoresis.  HENT: Negative for facial swelling and sore throat.   Respiratory: Positive for cough, sputum  production, shortness of breath and wheezing. Negative for hemoptysis.   Cardiovascular: Negative for chest pain, claudication, syncope and PND.  Gastrointestinal: Negative for vomiting and abdominal pain.  Genitourinary: Negative for dysuria.  Musculoskeletal: Positive for myalgias. Negative for back pain and neck pain.  Skin: Negative for rash.  Neurological: Negative for headaches.  Psychiatric/Behavioral: Negative for confusion.      Allergies  Review of patient's allergies indicates no known allergies.  Home Medications   Prior to Admission medications   Medication Sig Start Date End Date Taking? Authorizing Provider  amLODipine (NORVASC) 10 MG tablet Take 10 mg by mouth daily.   Yes Historical Provider, MD  atenolol (TENORMIN) 50 MG tablet Take 50 mg by mouth daily.   Yes Historical Provider, MD  cloNIDine (CATAPRES) 0.1 MG tablet Take 1 tablet by mouth daily. 03/19/15  Yes Historical Provider, MD  cyclobenzaprine (FLEXERIL) 10 MG tablet Take 10 mg by mouth 3 (three) times daily as needed for muscle spasms.   Yes Historical Provider, MD  ipratropium-albuterol (DUONEB) 0.5-2.5 (3) MG/3ML SOLN Take 3 mLs by nebulization every 4 (four) hours as needed. Shortness of breathe   Yes Historical Provider, MD  losartan (COZAAR) 50 MG tablet TAKE ONE TABLET BY MOUTH DAILY 08/05/15  Yes Kalman Shan, MD  montelukast (SINGULAIR) 10 MG tablet TAKE ONE TABLET BY MOUTH AT BEDTIME 03/19/15  Yes Kalman Shan, MD  terazosin (HYTRIN) 2 MG capsule Take 2 mg by mouth daily.  Yes Historical Provider, MD  albuterol (PROVENTIL HFA;VENTOLIN HFA) 108 (90 BASE) MCG/ACT inhaler 1-2 puffs q 6 hours x 3 days then prn SOB 10/26/13   Riki Sheer, PA-C  doxycycline (VIBRAMYCIN) 100 MG capsule Take 1 capsule (100 mg total) by mouth 2 (two) times daily. 09/09/15   Gavin Pound, MD  Fluticasone Furoate-Vilanterol (BREO ELLIPTA) 200-25 MCG/INH AEPB Inhale 1 puff into the lungs daily. 03/01/15   Kalman Shan, MD  predniSONE (DELTASONE) 20 MG tablet Take 2 tablets (40 mg total) by mouth daily. 09/09/15   Gavin Pound, MD   BP 142/98 mmHg  Pulse 112  Temp(Src) 98.1 F (36.7 C) (Oral)  Resp 22  SpO2 95% Physical Exam  Constitutional: He is oriented to person, place, and time. He appears well-developed and well-nourished. He appears distressed.  HENT:  Head: Normocephalic and atraumatic.  Right Ear: External ear normal.  Left Ear: External ear normal.  Nose: Nose normal.  Mouth/Throat: Oropharynx is clear and moist. No oropharyngeal exudate.  Eyes: Conjunctivae and EOM are normal. Pupils are equal, round, and reactive to light. Right eye exhibits no discharge. Left eye exhibits no discharge. No scleral icterus.  Neck: Normal range of motion. Neck supple. No JVD present. No tracheal deviation present. No thyromegaly present.  Cardiovascular: Normal rate, regular rhythm and intact distal pulses.   Pulmonary/Chest: No stridor. He is in respiratory distress. He has decreased breath sounds. He has wheezes. He has rales. He exhibits no tenderness.  Abdominal: Soft. He exhibits no distension.  Musculoskeletal: Normal range of motion. He exhibits no edema or tenderness.  Lymphadenopathy:    He has no cervical adenopathy.  Neurological: He is alert and oriented to person, place, and time. He has normal strength. He displays no atrophy and no tremor. He exhibits normal muscle tone. He displays no seizure activity. GCS eye subscore is 4. GCS verbal subscore is 5. GCS motor subscore is 6.  Skin: Skin is warm and dry. No rash noted. He is not diaphoretic. No erythema. No pallor.  Psychiatric: He has a normal mood and affect. His behavior is normal. Judgment and thought content normal.  Nursing note and vitals reviewed.   ED Course  Procedures (including critical care time) Labs Review Labs Reviewed  BASIC METABOLIC PANEL - Abnormal; Notable for the following:    Glucose, Bld 133 (*)     All other components within normal limits  CBC WITH DIFFERENTIAL/PLATELET - Abnormal; Notable for the following:    RDW 16.1 (*)    Eosinophils Absolute 1.0 (*)    All other components within normal limits  MAGNESIUM    Imaging Review Dg Chest Portable 1 View  09/09/2015  CLINICAL DATA:  Shortness of breath, asthma attack, cough for 1 week, hypertension, former smoker EXAM: PORTABLE CHEST 1 VIEW COMPARISON:  Portable exam 1030 hours compared to 10/01/2013 FINDINGS: Lordotic positioning with slight rotation to the RIGHT. Normal heart size, mediastinal contours, and pulmonary vascularity. Lungs clear. No pleural effusion or pneumothorax. Bones unremarkable. IMPRESSION: No acute abnormalities. Electronically Signed   By: Ulyses Southward M.D.   On: 09/09/2015 10:36   I have personally reviewed and evaluated these images and lab results as part of my medical decision-making.   EKG Interpretation   Date/Time:  Thursday September 09 2015 10:53:17 EST Ventricular Rate:  97 PR Interval:  170 QRS Duration: 81 QT Interval:  332 QTC Calculation: 422 R Axis:   73 Text Interpretation:  Sinus rhythm ST elevation, consider inferior injury  No significant change was found Confirmed by CAMPOS  MD, KEVIN ([id number]) on  09/09/2015 11:21:42 AM      MDM   Final diagnoses:  Asthma exacerbation    Davyd Podgorski is a 64 y.o. male patient presenting with shortness of breath.  Pt ran out of his albuterol 2 weeks ago and developed worsening SOB with increased coughing and sputum production.  Presentation consistent with Asthma/COPD exacerbation.  Pt was given IV solumedrol and 2 x continuous breathing tx.  Pt breathing improved.  No signs of PNA on CXR.  Will Rx for 4 more days of steroids, and 5 days of doxycycline BID.  Pt was given albuterol HFA in the ED.  Patient was given return precautions for COPD exacerbation.  Pt advised on use of medications as applicable.  Advised to return for actely worsening  symptoms, inability to take medications, or other acute concerns.  Advised to follow up with PCP in 2-5 days.  Patient and family were in agreement with and expressed understanding of follow plan, plan of care, and return precautions.  All questions answered prior to discharge.  Patient was discharged in stable condition with family, ambulating without difficulty.   Patient care was discussed with my attending, Dr. Patria Mane.      Gavin Pound, MD 09/10/15 1324  Azalia Bilis, MD 09/10/15 0730

## 2015-10-01 ENCOUNTER — Inpatient Hospital Stay (HOSPITAL_COMMUNITY)
Admission: EM | Admit: 2015-10-01 | Discharge: 2015-10-06 | DRG: 190 | Disposition: A | Discharge status: Home Health | Payer: Medicare Other | Attending: Internal Medicine | Admitting: Internal Medicine

## 2015-10-01 ENCOUNTER — Emergency Department (HOSPITAL_COMMUNITY): Payer: Medicare Other

## 2015-10-01 ENCOUNTER — Encounter (HOSPITAL_COMMUNITY): Payer: Self-pay | Admitting: *Deleted

## 2015-10-01 DIAGNOSIS — D72829 Elevated white blood cell count, unspecified: Secondary | ICD-10-CM | POA: Diagnosis present

## 2015-10-01 DIAGNOSIS — J449 Chronic obstructive pulmonary disease, unspecified: Secondary | ICD-10-CM | POA: Diagnosis present

## 2015-10-01 DIAGNOSIS — Z66 Do not resuscitate: Secondary | ICD-10-CM | POA: Diagnosis present

## 2015-10-01 DIAGNOSIS — Z6831 Body mass index (BMI) 31.0-31.9, adult: Secondary | ICD-10-CM

## 2015-10-01 DIAGNOSIS — J45901 Unspecified asthma with (acute) exacerbation: Secondary | ICD-10-CM | POA: Diagnosis present

## 2015-10-01 DIAGNOSIS — E860 Dehydration: Secondary | ICD-10-CM | POA: Diagnosis present

## 2015-10-01 DIAGNOSIS — E669 Obesity, unspecified: Secondary | ICD-10-CM | POA: Diagnosis present

## 2015-10-01 DIAGNOSIS — J9601 Acute respiratory failure with hypoxia: Secondary | ICD-10-CM | POA: Diagnosis not present

## 2015-10-01 DIAGNOSIS — K5900 Constipation, unspecified: Secondary | ICD-10-CM | POA: Diagnosis present

## 2015-10-01 DIAGNOSIS — J4551 Severe persistent asthma with (acute) exacerbation: Secondary | ICD-10-CM | POA: Diagnosis present

## 2015-10-01 DIAGNOSIS — R0689 Other abnormalities of breathing: Secondary | ICD-10-CM

## 2015-10-01 DIAGNOSIS — N179 Acute kidney failure, unspecified: Secondary | ICD-10-CM | POA: Diagnosis not present

## 2015-10-01 DIAGNOSIS — M1990 Unspecified osteoarthritis, unspecified site: Secondary | ICD-10-CM | POA: Diagnosis present

## 2015-10-01 DIAGNOSIS — T380X5A Adverse effect of glucocorticoids and synthetic analogues, initial encounter: Secondary | ICD-10-CM | POA: Diagnosis present

## 2015-10-01 DIAGNOSIS — J441 Chronic obstructive pulmonary disease with (acute) exacerbation: Principal | ICD-10-CM | POA: Diagnosis present

## 2015-10-01 DIAGNOSIS — J4550 Severe persistent asthma, uncomplicated: Secondary | ICD-10-CM | POA: Diagnosis present

## 2015-10-01 DIAGNOSIS — I10 Essential (primary) hypertension: Secondary | ICD-10-CM | POA: Diagnosis not present

## 2015-10-01 DIAGNOSIS — J4541 Moderate persistent asthma with (acute) exacerbation: Secondary | ICD-10-CM | POA: Diagnosis not present

## 2015-10-01 DIAGNOSIS — J9600 Acute respiratory failure, unspecified whether with hypoxia or hypercapnia: Secondary | ICD-10-CM | POA: Diagnosis present

## 2015-10-01 DIAGNOSIS — D696 Thrombocytopenia, unspecified: Secondary | ICD-10-CM | POA: Diagnosis present

## 2015-10-01 DIAGNOSIS — R739 Hyperglycemia, unspecified: Secondary | ICD-10-CM | POA: Diagnosis present

## 2015-10-01 DIAGNOSIS — Z87891 Personal history of nicotine dependence: Secondary | ICD-10-CM

## 2015-10-01 LAB — CBC WITH DIFFERENTIAL/PLATELET
BASOS ABS: 0.1 10*3/uL (ref 0.0–0.1)
BASOS PCT: 1 %
EOS ABS: 1.4 10*3/uL — AB (ref 0.0–0.7)
Eosinophils Relative: 12 %
HEMATOCRIT: 41.7 % (ref 39.0–52.0)
HEMOGLOBIN: 12.6 g/dL — AB (ref 13.0–17.0)
LYMPHS PCT: 32 %
Lymphs Abs: 3.7 10*3/uL (ref 0.7–4.0)
MCH: 26.3 pg (ref 26.0–34.0)
MCHC: 30.2 g/dL (ref 30.0–36.0)
MCV: 87.1 fL (ref 78.0–100.0)
MONOS PCT: 9 %
Monocytes Absolute: 1.1 10*3/uL — ABNORMAL HIGH (ref 0.1–1.0)
NEUTROS PCT: 46 %
Neutro Abs: 5.4 10*3/uL (ref 1.7–7.7)
Platelets: 265 10*3/uL (ref 150–400)
RBC: 4.79 MIL/uL (ref 4.22–5.81)
RDW: 15.7 % — ABNORMAL HIGH (ref 11.5–15.5)
WBC: 11.7 10*3/uL — ABNORMAL HIGH (ref 4.0–10.5)

## 2015-10-01 LAB — BLOOD GAS, VENOUS

## 2015-10-01 LAB — COMPREHENSIVE METABOLIC PANEL
ALBUMIN: 3.5 g/dL (ref 3.5–5.0)
ALK PHOS: 53 U/L (ref 38–126)
ALT: 20 U/L (ref 17–63)
ANION GAP: 4 — AB (ref 5–15)
AST: 20 U/L (ref 15–41)
BUN: 11 mg/dL (ref 6–20)
CALCIUM: 8.9 mg/dL (ref 8.9–10.3)
CO2: 31 mmol/L (ref 22–32)
Chloride: 103 mmol/L (ref 101–111)
Creatinine, Ser: 1.1 mg/dL (ref 0.61–1.24)
GFR calc non Af Amer: >60 mL/min (ref >60)
Glucose, Bld: 148 mg/dL — ABNORMAL HIGH (ref 65–99)
POTASSIUM: 4.4 mmol/L (ref 3.5–5.1)
SODIUM: 138 mmol/L (ref 135–145)
TOTAL PROTEIN: 6.8 g/dL (ref 6.5–8.1)
Total Bilirubin: 0.7 mg/dL (ref 0.3–1.2)

## 2015-10-01 LAB — BRAIN NATRIURETIC PEPTIDE: B Natriuretic Peptide: 20.2 pg/mL (ref 0.0–100.0)

## 2015-10-01 LAB — I-STAT VENOUS BLOOD GAS, ED
ACID-BASE EXCESS: 2 mmol/L (ref 0.0–2.0)
BICARBONATE: 32.7 meq/L — AB (ref 20.0–24.0)
O2 Saturation: 98 %
TCO2: 35 mmol/L (ref 0–100)
pCO2, Ven: 84.7 mmHg (ref 45.0–50.0)
pH, Ven: 7.195 — CL (ref 7.250–7.300)
pO2, Ven: 136 mmHg — ABNORMAL HIGH (ref 30.0–45.0)

## 2015-10-01 LAB — I-STAT CG4 LACTIC ACID, ED: LACTIC ACID, VENOUS: 0.92 mmol/L (ref 0.5–2.0)

## 2015-10-01 LAB — I-STAT TROPONIN, ED: TROPONIN I, POC: 0 ng/mL (ref 0.00–0.08)

## 2015-10-01 LAB — PROCALCITONIN

## 2015-10-01 MED ORDER — AMLODIPINE BESYLATE 10 MG PO TABS
10.0000 mg | ORAL_TABLET | Freq: Every day | ORAL | Status: DC
  Administered 2015-10-02 – 2015-10-06 (×5): 10 mg via ORAL
  Filled 2015-10-01 (×5): qty 1

## 2015-10-01 MED ORDER — FLUTICASONE FUROATE-VILANTEROL 200-25 MCG/INH IN AEPB: 1.0000 | INHALATION_SPRAY | Freq: Every day | RESPIRATORY_TRACT | Status: DC

## 2015-10-01 MED ORDER — ENOXAPARIN SODIUM 40 MG/0.4ML ~~LOC~~ SOLN
40.0000 mg | SUBCUTANEOUS | Status: DC
  Administered 2015-10-01 – 2015-10-05 (×5): 40 mg via SUBCUTANEOUS
  Filled 2015-10-01 (×5): qty 0.4

## 2015-10-01 MED ORDER — CLONIDINE HCL 0.1 MG PO TABS
0.1000 mg | ORAL_TABLET | Freq: Every day | ORAL | Status: DC
  Administered 2015-10-02 – 2015-10-04 (×3): 0.1 mg via ORAL
  Filled 2015-10-01 (×3): qty 1

## 2015-10-01 MED ORDER — PREDNISONE 50 MG PO TABS
60.0000 mg | ORAL_TABLET | Freq: Every day | ORAL | Status: DC
  Administered 2015-10-02: 60 mg via ORAL
  Filled 2015-10-01: qty 1

## 2015-10-01 MED ORDER — METHYLPREDNISOLONE SODIUM SUCC 125 MG IJ SOLR
60.0000 mg | Freq: Once | INTRAMUSCULAR | Status: AC
  Administered 2015-10-01: 60 mg via INTRAVENOUS
  Filled 2015-10-01: qty 2

## 2015-10-01 MED ORDER — ALBUTEROL SULFATE (2.5 MG/3ML) 0.083% IN NEBU
2.5000 mg | INHALATION_SOLUTION | RESPIRATORY_TRACT | Status: DC
  Administered 2015-10-01 – 2015-10-03 (×9): 2.5 mg via RESPIRATORY_TRACT
  Filled 2015-10-01 (×8): qty 3

## 2015-10-01 MED ORDER — ALBUTEROL (5 MG/ML) CONTINUOUS INHALATION SOLN
INHALATION_SOLUTION | RESPIRATORY_TRACT | Status: AC
  Administered 2015-10-01: 20 mg/h via RESPIRATORY_TRACT
  Filled 2015-10-01: qty 20

## 2015-10-01 MED ORDER — ALBUTEROL (5 MG/ML) CONTINUOUS INHALATION SOLN
20.0000 mg/h | INHALATION_SOLUTION | RESPIRATORY_TRACT | Status: AC
  Administered 2015-10-01: 20 mg/h via RESPIRATORY_TRACT

## 2015-10-01 MED ORDER — ACETAMINOPHEN 325 MG PO TABS
650.0000 mg | ORAL_TABLET | Freq: Four times a day (QID) | ORAL | Status: DC | PRN
  Administered 2015-10-02 – 2015-10-03 (×2): 650 mg via ORAL
  Filled 2015-10-01 (×2): qty 2

## 2015-10-01 MED ORDER — SENNOSIDES-DOCUSATE SODIUM 8.6-50 MG PO TABS: 1.0000 | ORAL_TABLET | Freq: Every evening | ORAL | Status: DC | PRN

## 2015-10-01 MED ORDER — ALBUTEROL SULFATE (2.5 MG/3ML) 0.083% IN NEBU
2.5000 mg | INHALATION_SOLUTION | RESPIRATORY_TRACT | Status: DC | PRN
  Administered 2015-10-02: 2.5 mg via RESPIRATORY_TRACT
  Filled 2015-10-01 (×2): qty 3

## 2015-10-01 MED ORDER — SODIUM CHLORIDE 0.9 % IJ SOLN
3.0000 mL | Freq: Two times a day (BID) | INTRAMUSCULAR | Status: DC
  Administered 2015-10-01 – 2015-10-06 (×10): 3 mL via INTRAVENOUS

## 2015-10-01 MED ORDER — ACETAMINOPHEN 650 MG RE SUPP: 650.0000 mg | Freq: Four times a day (QID) | RECTAL | Status: DC | PRN

## 2015-10-01 MED ORDER — LOSARTAN POTASSIUM 50 MG PO TABS
50.0000 mg | ORAL_TABLET | Freq: Every day | ORAL | Status: DC
  Administered 2015-10-02: 50 mg via ORAL
  Filled 2015-10-01: qty 1

## 2015-10-01 MED ORDER — DIPHENHYDRAMINE HCL 50 MG/ML IJ SOLN
12.5000 mg | Freq: Once | INTRAMUSCULAR | Status: AC
  Administered 2015-10-01: 12.5 mg via INTRAVENOUS
  Filled 2015-10-01: qty 1

## 2015-10-01 MED ORDER — SODIUM CHLORIDE 0.9 % IV SOLN
INTRAVENOUS | Status: DC
  Administered 2015-10-01: 125 mL via INTRAVENOUS

## 2015-10-01 MED ORDER — MONTELUKAST SODIUM 10 MG PO TABS
10.0000 mg | ORAL_TABLET | Freq: Every day | ORAL | Status: DC
  Administered 2015-10-01 – 2015-10-05 (×5): 10 mg via ORAL
  Filled 2015-10-01 (×5): qty 1

## 2015-10-01 MED ORDER — ALBUTEROL (5 MG/ML) CONTINUOUS INHALATION SOLN: 20.0000 mg/h | INHALATION_SOLUTION | RESPIRATORY_TRACT | Status: DC

## 2015-10-01 NOTE — Progress Notes (Signed)
Patient taken off of BiPAP per MD. Patient started on another 20 mg CAT.

## 2015-10-01 NOTE — Progress Notes (Signed)
Patient is fully awake, wanting mask off. Placed patient on CAT on aerosol mask. Audible wheezes. MD aware.

## 2015-10-01 NOTE — Progress Notes (Signed)
Pt transported to 2H room 24 on BIPAP. Pt has WOB before and after trip. RT called for new Pt.

## 2015-10-01 NOTE — ED Notes (Signed)
To ED emergency traffic for severe sob. Pt from home. Has been using home albuterol all day. Pt is grunting on arrival. Arrives on neb tx. enroute pt had solumedrol 125mg , 10mg  albuteral, 0.3 epi IM, 2mg  Mg, 0.5 Atrovent. Pt would not tolerate CPAP enroute. Not communicating with staff verbally. Minimally follows commands. RT at bedside attempting to place on bi-pap. EDP in room on arrival

## 2015-10-01 NOTE — ED Notes (Signed)
Pt. Resting comfortablly. No s/s of resp. Distress.  Pt. Is tolerating Bipap without any difficulty,

## 2015-10-01 NOTE — ED Notes (Signed)
Called his wife Luretha Rued and informed her that pt. Was here in our ED

## 2015-10-01 NOTE — Progress Notes (Signed)
RT placed patient back on BiPAP for WOB. Will reassess after CAT is finished. RN aware.

## 2015-10-01 NOTE — ED Provider Notes (Signed)
CSN: [id number]     Arrival date & time 10/01/15  1702 History   First MD Initiated Contact with Patient 10/01/15 1726     Chief Complaint  Patient presents with  . Respiratory Distress     (Consider location/radiation/quality/duration/timing/severity/associated sxs/prior Treatment) HPI Comments: 64 year old male with past medical history including hypertension, COPD presents with shortness of breath. History limited due to the patient's respiratory distress and obtained primarily from EMS. EMS reports that the patient called for severe shortness of breath. He had been using albuterol day without relief. In route, they gave the patient 125 mg Solu-Medrol, albuterol, do 0.3 mg IM epi, 2g mag, and atrovent. He was in able to tolerate CPAP. He has been minimally communicative.   On reexamination several hours later, the patient reports that he had a COPD exacerbation 2 weeks ago for which he was treated in the emergency department and discharged home. He has continued to have some wheezing since then but his wheezing has gotten much worse over the past 3-4 days. He denies any fevers or chest pain. No change in his cough.  The history is provided by the EMS personnel.    Past Medical History  Diagnosis Date  . Hypertension   . Asthma   . Exertional shortness of breath   . Arthritis     "hips" (10/01/2013)   Past Surgical History  Procedure Laterality Date  . Total hip arthroplasty Right 2010  . 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 Joint replacement     History reviewed. No pertinent family history. Social History  Substance Use Topics  . Smoking status: Former Smoker -- 0.50 packs/day for 2 years    Types: Cigarettes    Quit date: 10/23/1990  . Smokeless tobacco: Never Used     Comment: 10/01/2013 "quit smoking cigarettes~ 15-20  yr ago"  . Alcohol Use: 7.2 oz/week    12 Cans of beer per week     Comment: 08/19/2013 "3 times/wk; 3-4 beers/night"    Review of Systems  10 Systems reviewed and are negative for acute change except as noted in the HPI.   Allergies  Review of patient's allergies indicates no known allergies.  Home Medications   Prior to Admission medications   Medication Sig Start Date End Date Taking? Authorizing Provider  albuterol (PROVENTIL HFA;VENTOLIN HFA) 108 (90 BASE) MCG/ACT inhaler 1-2 puffs q 6 hours x 3 days then prn SOB 10/26/13   Riki Sheer, PA-C  amLODipine (NORVASC) 10 MG tablet Take 10 mg by mouth daily.    Historical Provider, MD  atenolol (TENORMIN) 50 MG tablet Take 50 mg by mouth daily.    Historical Provider, MD  cloNIDine (CATAPRES) 0.1 MG tablet Take 1 tablet by mouth daily. 03/19/15   Historical Provider, MD  cyclobenzaprine (FLEXERIL) 10 MG tablet Take 10 mg by mouth 3 (three) times daily as needed for muscle spasms.    Historical Provider, MD  doxycycline (VIBRAMYCIN) 100 MG capsule Take 1 capsule (100 mg total) by mouth 2 (two) times daily. 09/09/15   Gavin Pound, MD  Fluticasone Furoate-Vilanterol (BREO ELLIPTA) 200-25 MCG/INH AEPB Inhale 1 puff into the lungs daily. 03/01/15   Kalman Shan, MD  ipratropium-albuterol (DUONEB) 0.5-2.5 (3) MG/3ML SOLN Take 3 mLs by nebulization every 4 (four) hours as needed. Shortness of breathe    Historical Provider, MD  losartan (COZAAR) 50 MG tablet TAKE ONE TABLET BY MOUTH DAILY 08/05/15   Kalman Shan, MD  montelukast (SINGULAIR) 10 MG tablet TAKE ONE TABLET BY MOUTH AT BEDTIME 03/19/15   Kalman Shan, MD  predniSONE (DELTASONE) 20 MG tablet Take 2 tablets (40 mg total) by mouth daily. 09/09/15   Gavin Pound, MD  terazosin (HYTRIN) 2 MG capsule Take 2 mg by mouth daily.    Historical Provider, MD   BP 108/85 mmHg  Pulse 94  Resp 22  Ht  (1.727 m)  Wt 210 lb (95.255 kg)  BMI 31.94 kg/m2  SpO2  100% Physical Exam  Constitutional: He appears well-developed and well-nourished.  In severe respiratory distress, sitting forward and grunting  HENT:  Head: Normocephalic and atraumatic.  Mouth/Throat: Oropharynx is clear and moist.  Moist mucous membranes  Eyes: Conjunctivae are normal. Pupils are equal, round, and reactive to light.  Neck: Neck supple.  Cardiovascular: Regular rhythm and normal heart sounds.   No murmur heard. Tachycardic  Pulmonary/Chest: He is in respiratory distress. He has wheezes.  insp and exp wheezes with prolonged expiratory phase, accessory muscle use, and nasal flaring  Abdominal: Soft. Bowel sounds are normal. He exhibits no distension. There is no tenderness.  Musculoskeletal: He exhibits no edema.  Neurological:  Opens eyes to voice but will not follow commands  Skin: Skin is warm.  Diaphoretic  Nursing note and vitals reviewed.   ED Course  .Critical Care Performed by: Laurence Spates Authorized by: Laurence Spates Total critical care time: 60 minutes Critical care time was exclusive of separately billable procedures and treating other patients. Critical care was necessary to treat or prevent imminent or life-threatening deterioration of the following conditions: respiratory failure. Critical care was time spent personally by me on the following activities: development of treatment plan with patient or surrogate, evaluation of patient's response to treatment, examination of patient, obtaining history from patient or surrogate, ordering and performing treatments and interventions, ordering and review of laboratory studies, ordering and review of radiographic studies, pulse oximetry, re-evaluation of patient's condition and review of old charts.   (including critical care time) Labs Review Labs Reviewed  COMPREHENSIVE METABOLIC PANEL - Abnormal; Notable for the following:    Glucose, Bld 148 (*)    Anion gap 4 (*)    All other  components within normal limits  CBC WITH DIFFERENTIAL/PLATELET - Abnormal; Notable for the following:    WBC 11.7 (*)    Hemoglobin 12.6 (*)    RDW 15.7 (*)    Monocytes Absolute 1.1 (*)    Eosinophils Absolute 1.4 (*)    All other components within normal limits  I-STAT VENOUS BLOOD GAS, ED - Abnormal; Notable for the following:    pH, Ven 7.195 (*)    pCO2, Ven 84.7 (*)    pO2, Ven 136.0 (*)    Bicarbonate 32.7 (*)    All other components within normal limits  BLOOD GAS, VENOUS  BRAIN NATRIURETIC PEPTIDE  I-STAT CG4 LACTIC ACID, ED  Rosezena Sensor, ED    Imaging Review Dg Chest Port 1 View  10/01/2015  CLINICAL DATA:  Shortness of breath, COPD EXAM: PORTABLE CHEST 1 VIEW COMPARISON:  09/09/2015 FINDINGS: The heart size and mediastinal contours are within normal limits. Both lungs are clear. The visualized skeletal structures are unremarkable. IMPRESSION: No active disease. Electronically Signed   By: Natasha Mead M.D.   On: 10/01/2015 17:55   I have personally reviewed and evaluated these lab  results as part of my medical decision-making.   EKG Interpretation   Date/Time:  Friday October 01 2015 17:12:13 EST Ventricular Rate:  100 PR Interval:  182 QRS Duration: 86 QT Interval:  336 QTC Calculation: 433 R Axis:   77 Text Interpretation:  Sinus tachycardia With exception of tachycardia, No  significant change since last tracing Confirmed by LITTLE MD, RACHEL  [phone]) on 10/01/2015 6:36:20 PM     Medications  albuterol (PROVENTIL,VENTOLIN) solution continuous neb (20 mg/hr Nebulization New Bag/Given 10/01/15 1719)  albuterol (PROVENTIL,VENTOLIN) solution continuous neb (not administered)    MDM   Final diagnoses:  Acute exacerbation of chronic obstructive pulmonary disease (COPD) (HCC)  Hypercarbia   patient with known history of COPD presents in respiratory distress by EMS. He had received steroids, albuterol, IM epinephrine, and Atrovent during transport. On  arrival, the patient was severely dyspneic, sitting forward, in severe respiratory distress. O2 sat was 98-99% on nonrebreather. Diminished breath sounds bilaterally with significant wheezing. Place the patient on continuous albuterol with BiPAP and prepared for intubation. The patient quickly improved on bipap and began following commands, therefore left patient on bipap/albuterol and obtained portable CXR, EKG, labs.   Chest x-ray negative acute. EKG shows sinus tachycardia but otherwise unremarkable. Labs notable for a VBG with pH 7.195, CO2 85, negative troponin, normal lactate. On reexamination several hours after BiPAP, the patient's work of breathing had improved and he stated he felt much better. Place the patient on continuous albuterol. On reexamination over an hour after continuous albuterol, the patient remains stable. I discussed the patient with Dr. Maryfrances Bunnell, [facility] hospitalist, and patient admitted to stepdown for further care.    Laurence Spates, MD 10/01/15 2135

## 2015-10-01 NOTE — H&P (Signed)
History and Physical  Patient Name: Evan Brock     ZOX:[id number]    DOB: [birth year]    DOA: 10/01/2015 Referring physician: Frederick Peers, MD PCP: PROVIDER NOT IN SYSTEM      Chief Complaint: Short of breath  HPI: Evan Brock is a 64 y.o. male with a past medical history significant for COPD FEV1 30-50% and HTN who presents with shortness of breath for three days.  The patient and his usual state of health until about 3 weeks ago when he developed shortness of breath, wheezing, and productive cough. He came to the emergency room where he was treated with bronchodilators and steroids and discharged with 5 days of prednisone and doxycycline. He felt back to normal after that but once he completed these 2 medicines he started to feel a little bit better. This progressed until the last 3 days when he felt acutely worse with wheezing, inability to catch his breath, and cough. There is no recent URI, no sick contacts. He does not smoke. Tonight he was so short of breath that he felt "out of it" so he called 911, and was given Solu-Medrol, magnesium, and epinephrine en route by EMS.  In the ED, the patient presented in extremis and had a VBG which showed a pH of 7.2 and a PCO2 of 84. He was given continuous bronchodilators and BiPAP, and his mental status and breathing improved. A chest x-ray was clear, and the patient denied fever or new sputum. TR[facility] were asked to admit for COPD exacerbation.  Of note, the patient is not able to afford his Breo ellipta and has not taken this in about 2 months     Review of Systems:  Pt complains of wheezing, shortness of breath, confusion, orthopnea, constipation, exposure to dust and smoke recently. Pt denies any chest pain, pleuritic pain, leg swelling, fever, new sputum, purulent sputum.  All other systems negative except as just noted or noted in the history of present illness.   Allergies: No Known Allergies  Home medications: 1. Fluticasone-Vilanterol  200-25, not for last two months 2. Montelukast 10 mg daily 3. Albuterol as needed 4. Amlodipine 10 mg daily 5. Atenolol 50 mg daily 6. Clonidine 0.1 mg daily 7. Losartan 50 ng daily 8. Terazosin 2 mg daily  The patient completed a course of prednisone and doxycycline 3 weeks ago.   Past medical history: 1. COPD, gold stage III, FEV1 last 47% in July  2. Hypertension 3. Osteoarthritis  Past surgical history: 1. Left total hip arthroplasty 2. Right total hip arthroplasty  Family history:  No family history of lung disease.  Mother, diabetes.  Father, HTN. Sister, lupus.    Social History:  Patient lives with his wife and two grandchildren.  He is a remote former smoker. He worked for a period of time in a [employer], and developed lung disease after that. He drinks alcohol. He is on disability, but is normally ambulatory without assistive devices.      Physical Exam: BP 108/85 mmHg  Pulse 94  Resp 22  Ht  (1.727 m)  Wt 95.255 kg (210 lb)  BMI 31.94 kg/m2  SpO2 100% General appearance: Well-developed, adult male, alert and in mild distress from dyspnea, increased WOB moderate.   Eyes: Anicteric, lids and lashes normal.     ENT: No nasal deformity, discharge, or epistaxis.  OP moist without lesions.  Edentulous. No neck swelling. Lymph: No cervical lymphadenopathy. Skin: Warm and dry.  No suspicious rashes or  lesions on the face, neck, chest, abdomen arms. Cardiac: Tachycardic, regular, nl S1-S2, no murmurs appreciated.  No LE edema.  Radial and DP pulses 2+ and symmetric. Respiratory: Moderately increased WOB.  Diminished breath sounds globally, with prominent expiratory wheezes and airway congestion. Abdomen: Abdomen soft without rigidity.  No TTP.  Distended but not tense.   MSK: No deformities or effusions. Neuro: Sensorium intact and responding to questions, attention normal at this time.  Oriented to situation, place, and person.  Speech is fluent.   Moves all extremities equally and with normal coordination.    Psych: Behavior appropriate.  Affect normal.  No evidence of aural or visual hallucinations or delusions.       Labs on Admission:  The metabolic panel shows normal sodium, potassium, renal function. The baseline bicarbonate is high normal. The BNP is low. The lactic acid level is normal. The troponin is negative.  The complete blood count shows mild leukocytosis 11.7K/uL and no anemia or thrombocytopenia.   Radiological Exams on Admission: Personally reviewed: Dg Chest Port 1 View 10/01/2015   No focal opacities.  Chronic bronchitic changes.   EKG: Independently reviewed. Sinus tachycardia rate 100. No ST changes.    Assessment/Plan 1. COPD exacerbation:  This is new.  The patient is improved with bronchodilators, IV corticosteroids, and magnesium at this time, but still at risk for needing NIPPV.     -Admit to SDU -Repeat VBG and Bipap as needed -Albuterol scheduled and PRN -Methylprednisolone 60 mg IV at midnight and continue prednisone 60 mg daily in the morning, consider prolonged taper -Continue home Breo ellipta and montelukast -Consult to Care Mgmt regarding affordability of Breo; alternatives might be Flovent plus LAMA separately or just Flovent -Low suspicion for pneumonia at this time, procalcitonin and start antibiotics if high or if patient condition worsens   2. HTN:  Somewhat low BP at admission. -Continue amlodipine, losartan, clonidine with hold parameters -Hold terazosin, to resume at d/c -Discontinue atenolol per Pulm note 07/14/15     DVT PPx: Lovenox Diet: Regular Consultants: Care Mgmt Code Status: DNI; the patient was coherent and understood conditions that might lead to intubation, which he does not wish for.  I have asked him to communicate this to his wife. Family Communication: Call to patient's wife, no answer.    Medical decision making: What exists of the patient's previous  chart was reviewed in depth and the case was discussed with Dr. Clarene Duke. Patient seen 8:56 PM on 10/01/2015.  Disposition Plan:  Admit to SDU given need for BiPAP. Steroids, bronchodilators, consult with care management regarding medication affordability. Anticipate hospitalization for 2-3 days.       Alberteen Sam [facility] Pager [phone]

## 2015-10-02 LAB — COMPREHENSIVE METABOLIC PANEL
ALK PHOS: 47 U/L (ref 38–126)
ALT: 21 U/L (ref 17–63)
ANION GAP: 12 (ref 5–15)
AST: 24 U/L (ref 15–41)
Albumin: 3.5 g/dL (ref 3.5–5.0)
BILIRUBIN TOTAL: 0.7 mg/dL (ref 0.3–1.2)
BUN: 16 mg/dL (ref 6–20)
CALCIUM: 8.9 mg/dL (ref 8.9–10.3)
CO2: 24 mmol/L (ref 22–32)
CREATININE: 1.3 mg/dL — AB (ref 0.61–1.24)
Chloride: 104 mmol/L (ref 101–111)
GFR, EST NON AFRICAN AMERICAN: 56 mL/min — AB (ref >60)
Glucose, Bld: 194 mg/dL — ABNORMAL HIGH (ref 65–99)
Potassium: 4.8 mmol/L (ref 3.5–5.1)
Sodium: 140 mmol/L (ref 135–145)
TOTAL PROTEIN: 7 g/dL (ref 6.5–8.1)

## 2015-10-02 LAB — CBC
HEMATOCRIT: 39.9 % (ref 39.0–52.0)
HEMOGLOBIN: 12.2 g/dL — AB (ref 13.0–17.0)
MCH: 26.5 pg (ref 26.0–34.0)
MCHC: 30.6 g/dL (ref 30.0–36.0)
MCV: 86.7 fL (ref 78.0–100.0)
Platelets: 256 10*3/uL (ref 150–400)
RBC: 4.6 MIL/uL (ref 4.22–5.81)
RDW: 16 % — ABNORMAL HIGH (ref 11.5–15.5)
WBC: 8.7 10*3/uL (ref 4.0–10.5)

## 2015-10-02 LAB — POCT I-STAT 3, ART BLOOD GAS (G3+)
Acid-base deficit: 4 mmol/L — ABNORMAL HIGH (ref 0.0–2.0)
BICARBONATE: 22.2 meq/L (ref 20.0–24.0)
O2 SAT: 74 %
PCO2 ART: 45.1 mmHg — AB (ref 35.0–45.0)
Patient temperature: 98.6
TCO2: 24 mmol/L (ref 0–100)
pH, Arterial: 7.3 — ABNORMAL LOW (ref 7.350–7.450)
pO2, Arterial: 44 mmHg — ABNORMAL LOW (ref 80.0–100.0)

## 2015-10-02 LAB — MRSA PCR SCREENING: MRSA by PCR: NEGATIVE

## 2015-10-02 MED ORDER — ZOLPIDEM TARTRATE 5 MG PO TABS
5.0000 mg | ORAL_TABLET | Freq: Once | ORAL | Status: AC
  Administered 2015-10-02: 5 mg via ORAL
  Filled 2015-10-02: qty 1

## 2015-10-02 MED ORDER — SODIUM CHLORIDE 0.9 % IV SOLN
INTRAVENOUS | Status: DC
  Administered 2015-10-02 – 2015-10-03 (×2): via INTRAVENOUS

## 2015-10-02 MED ORDER — SENNOSIDES-DOCUSATE SODIUM 8.6-50 MG PO TABS
1.0000 | ORAL_TABLET | Freq: Two times a day (BID) | ORAL | Status: DC
  Administered 2015-10-02 – 2015-10-05 (×7): 1 via ORAL
  Filled 2015-10-02 (×8): qty 1

## 2015-10-02 MED ORDER — BISACODYL 10 MG RE SUPP: 10.0000 mg | Freq: Every day | RECTAL | Status: DC | PRN

## 2015-10-02 MED ORDER — METHYLPREDNISOLONE SODIUM SUCC 125 MG IJ SOLR
60.0000 mg | Freq: Two times a day (BID) | INTRAMUSCULAR | Status: DC
Start: 2015-10-02 — End: 2015-10-04
  Administered 2015-10-02 – 2015-10-03 (×4): 60 mg via INTRAVENOUS
  Filled 2015-10-02 (×4): qty 2

## 2015-10-02 MED ORDER — GUAIFENESIN ER 600 MG PO TB12
600.0000 mg | ORAL_TABLET | Freq: Two times a day (BID) | ORAL | Status: DC | PRN
  Administered 2015-10-02: 600 mg via ORAL
  Filled 2015-10-02: qty 1

## 2015-10-02 MED ORDER — CYCLOBENZAPRINE HCL 10 MG PO TABS
10.0000 mg | ORAL_TABLET | Freq: Three times a day (TID) | ORAL | Status: DC | PRN
Start: 2015-10-02 — End: 2015-10-06
  Administered 2015-10-02: 10 mg via ORAL
  Filled 2015-10-02: qty 1

## 2015-10-02 MED ORDER — POLYETHYLENE GLYCOL 3350 17 G PO PACK
17.0000 g | PACK | Freq: Every day | ORAL | Status: DC
  Administered 2015-10-02 – 2015-10-05 (×3): 17 g via ORAL
  Filled 2015-10-02 (×4): qty 1

## 2015-10-02 NOTE — Progress Notes (Signed)
[facility] TEAM 1 - Stepdown/ICU TEAM  Phi Olczak ZGY:[id number] DOB: [birth year] DOA: 10/01/2015 PCP: PROVIDER NOT IN SYSTEM  Admit HPI / Brief Narrative: 64 y.o. male with a history of COPD (FEV1 30-50%) and HTN who presented with shortness of breath for three days.  3 weeks prior he developed shortness of breath, wheezing, and productive cough. He went to the ED where he was treated with bronchodilators and steroids and discharged with 5 days of prednisone and doxycycline. He improved initially, but then 3 days prior to this admit he felt acutely worse with wheezing, inability to catch his breath, and cough. He called 911, and was given Solu-Medrol, magnesium, and epinephrine en route by EMS.  In the ED, the patient presented in extremis and had a VBG which showed a pH of 7.2 and a PCO2 of 84. He was given continuous bronchodilators and BiPAP, and his mental status and breathing improved. A chest x-ray was clear, and the patient denied fever or new sputum. Of note, the patient was not able to afford his Breo ellipta and had not taken this in about 2 months   HPI/Subjective: The patient states he feels somewhat better overall but that his breathing is still not back to baseline.  Audible wheezes are appreciable just when standing in the room.  The patient does not appear to be in severe acute distress however.  He denies chest pain fevers chills nausea or vomiting.  He does admit to migratory moderate muscle cramps which are a new problem for him.  Assessment/Plan:  Severe persistent Asthma w/ acute bronchospastic exacerbation  -ABG improved  -slowly improving w/ usual tx regimen - follow w/o change in tx today  -Consult to Care Mgmt regarding affordability of Breo; alternatives might be Flovent plus LAMA separately or just Flovent - of note pt is intolerant to Spiriva -followed by Dr. Marchelle Gearing in outpt setting   HTN  -Somewhat low BP at admission - BP now well controlled  -Discontinue  atenolol per Pulm note 07/14/15  Hyperglycemia -due to high dose steroid use - no hx of DM - check A1c  Constipation -tx and follow - no sx to suggest ileus or obstruction   Acute renal insufficiency  -follow renal function   Code Status: DO NOT INTUBATE Family Communication: no family present at time of exam Disposition Plan: SDU as resp status remains tenuous  Consultants: none  Procedures: none  Antibiotics: none  DVT prophylaxis: lovenox   Objective: Blood pressure 127/88, pulse 98, temperature 97.8 F (36.6 C), temperature source Oral, resp. rate 22, height 5\' 9"  (1.753 m), weight 95.6 kg (210 lb 12.2 oz), SpO2 98 %.  Intake/Output Summary (Last 24 hours) at 10/02/15 1009 Last data filed at 10/02/15 0900  Gross per 24 hour  Intake 1201.25 ml  Output    350 ml  Net 851.25 ml   Exam: General: mild resp distress - able to complete full sentences  Lungs: Diffuse expiratory wheeze with mildly prolonged expiratory phase with no focal crackles  Cardiovascular: mildly tachycardic but regular without appreciable murmur gallop or rub  Abdomen: Nontender, nondistended, soft, bowel sounds positive, no rebound, no ascites, no appreciable mass Extremities: No significant cyanosis, clubbing, or edema bilateral lower extremities  Data Reviewed: Basic Metabolic Panel:  Recent Labs Lab 10/01/15 1710 10/02/15 0430  NA 138 140  K 4.4 4.8  CL 103 104  CO2 31 24  GLUCOSE 148* 194*  BUN 11 16  CREATININE 1.10 1.30*  CALCIUM 8.9 8.9  CBC:  Recent Labs Lab 10/01/15 1710 10/02/15 0430  WBC 11.7* 8.7  NEUTROABS 5.4  --   HGB 12.6* 12.2*  HCT 41.7 39.9  MCV 87.1 86.7  PLT 265 256    Liver Function Tests:  Recent Labs Lab 10/01/15 1710 10/02/15 0430  AST 20 24  ALT 20 21  ALKPHOS 53 47  BILITOT 0.7 0.7  PROT 6.8 7.0  ALBUMIN 3.5 3.5   CBG: No results for input(s): GLUCAP in the last 168 hours.  Recent Results (from the past 240 hour(s))  MRSA PCR  Screening     Status: None   Collection Time: 10/01/15  9:37 PM  Result Value Ref Range Status   MRSA by PCR NEGATIVE NEGATIVE Final    Comment:        The GeneXpert MRSA Assay (FDA approved for NASAL specimens only), is one component of a comprehensive MRSA colonization surveillance program. It is not intended to diagnose MRSA infection nor to guide or monitor treatment for MRSA infections.      Studies:   Recent x-ray studies have been reviewed in detail by the Attending Physician  Scheduled Meds:  Scheduled Meds: . albuterol  2.5 mg Nebulization Q4H  . amLODipine  10 mg Oral Daily  . cloNIDine  0.1 mg Oral Daily  . enoxaparin (LOVENOX) injection  40 mg Subcutaneous Q24H  . losartan  50 mg Oral Daily  . montelukast  10 mg Oral QHS  . predniSONE  60 mg Oral Q breakfast  . sodium chloride  3 mL Intravenous Q12H    Time spent on care of this patient: 35 mins   Esai Stecklein T , MD   [facility] Office  [phone] Pager - Text Page per Loretha Stapler as per below:  On-Call/Text Page:      [email or web address]      password [email or web address]  If 7PM-7AM, please contact night-coverage [email or web address] Password [email or web address] 10/02/2015, 10:09 AM   LOS: 1 day

## 2015-10-03 LAB — CBC
HEMATOCRIT: 36.9 % — AB (ref 39.0–52.0)
Hemoglobin: 11.3 g/dL — ABNORMAL LOW (ref 13.0–17.0)
MCH: 26.5 pg (ref 26.0–34.0)
MCHC: 30.6 g/dL (ref 30.0–36.0)
MCV: 86.6 fL (ref 78.0–100.0)
Platelets: 236 10*3/uL (ref 150–400)
RBC: 4.26 MIL/uL (ref 4.22–5.81)
RDW: 16.3 % — AB (ref 11.5–15.5)
WBC: 14.2 10*3/uL — AB (ref 4.0–10.5)

## 2015-10-03 LAB — BASIC METABOLIC PANEL
Anion gap: 7 (ref 5–15)
BUN: 16 mg/dL (ref 6–20)
CHLORIDE: 104 mmol/L (ref 101–111)
CO2: 28 mmol/L (ref 22–32)
Calcium: 9.1 mg/dL (ref 8.9–10.3)
Creatinine, Ser: 0.96 mg/dL (ref 0.61–1.24)
GFR calc Af Amer: >60 mL/min (ref >60)
GFR calc non Af Amer: >60 mL/min (ref >60)
Glucose, Bld: 146 mg/dL — ABNORMAL HIGH (ref 65–99)
POTASSIUM: 4.9 mmol/L (ref 3.5–5.1)
SODIUM: 139 mmol/L (ref 135–145)

## 2015-10-03 MED ORDER — LEVOFLOXACIN 750 MG PO TABS
750.0000 mg | ORAL_TABLET | Freq: Every day | ORAL | Status: DC
  Administered 2015-10-03 – 2015-10-06 (×4): 750 mg via ORAL
  Filled 2015-10-03 (×4): qty 1

## 2015-10-03 MED ORDER — ZOLPIDEM TARTRATE 5 MG PO TABS
5.0000 mg | ORAL_TABLET | Freq: Once | ORAL | Status: AC
  Administered 2015-10-03: 5 mg via ORAL
  Filled 2015-10-03: qty 1

## 2015-10-03 MED ORDER — ALBUTEROL SULFATE (2.5 MG/3ML) 0.083% IN NEBU: 2.5000 mg | INHALATION_SOLUTION | Freq: Four times a day (QID) | RESPIRATORY_TRACT | Status: DC

## 2015-10-03 MED ORDER — LOSARTAN POTASSIUM 50 MG PO TABS
50.0000 mg | ORAL_TABLET | Freq: Every day | ORAL | Status: DC
  Administered 2015-10-03 – 2015-10-06 (×4): 50 mg via ORAL
  Filled 2015-10-03 (×4): qty 1

## 2015-10-03 MED ORDER — IPRATROPIUM-ALBUTEROL 0.5-2.5 (3) MG/3ML IN SOLN
3.0000 mL | Freq: Four times a day (QID) | RESPIRATORY_TRACT | Status: DC
Start: 2015-10-03 — End: 2015-10-06
  Administered 2015-10-03 – 2015-10-06 (×13): 3 mL via RESPIRATORY_TRACT
  Filled 2015-10-03 (×13): qty 3

## 2015-10-03 MED ORDER — IPRATROPIUM BROMIDE 0.02 % IN SOLN: 0.5000 mg | Freq: Four times a day (QID) | RESPIRATORY_TRACT | Status: DC

## 2015-10-03 MED ORDER — IPRATROPIUM BROMIDE 0.02 % IN SOLN: 0.5000 mg | RESPIRATORY_TRACT | Status: DC

## 2015-10-03 MED ORDER — BUDESONIDE 0.25 MG/2ML IN SUSP
0.2500 mg | Freq: Two times a day (BID) | RESPIRATORY_TRACT | Status: DC
  Administered 2015-10-03 – 2015-10-06 (×7): 0.25 mg via RESPIRATORY_TRACT
  Filled 2015-10-03 (×7): qty 2

## 2015-10-03 MED ORDER — TERAZOSIN HCL 2 MG PO CAPS
2.0000 mg | ORAL_CAPSULE | Freq: Every day | ORAL | Status: DC
  Administered 2015-10-03 – 2015-10-06 (×4): 2 mg via ORAL
  Filled 2015-10-03 (×7): qty 1

## 2015-10-03 NOTE — Progress Notes (Signed)
[place] TEAM 1 - Stepdown/ICU TEAM  Evan Brock WUJ:[id number] DOB: 1951-09-25 DOA: 10/01/2015 PCP: PROVIDER NOT IN SYSTEM  Admit HPI / Brief Narrative: 64 y.o. male with a history of "persistent severe asthma" and HTN who presented with shortness of breath for three days.  3 weeks prior he developed shortness of breath, wheezing, and productive cough. He went to the ED where he was treated with bronchodilators and steroids and discharged with 5 days of prednisone and doxycycline. He improved initially, but then 3 days prior to this admit he felt acutely worse with wheezing, inability to catch his breath, and cough. He called 911, and was given Solu-Medrol, magnesium, and epinephrine en route by EMS.  In the ED, the patient presented in extremis and had a VBG which showed a pH of 7.2 and a PCO2 of 84. He was given continuous bronchodilators and BiPAP, and his mental status and breathing improved. A chest x-ray was clear, and the patient denied fever or new sputum. Of note, the patient was not able to afford his Breo ellipta and had not taken this in about 2 months   HPI/Subjective: The pt is feeling better overall, but continues to wheeze.  He denies cp, n/v, or abdom pain.  He feels his breathing is much improved overall, but admits he is still not at his baseline respiratory status, which is wheeze free.    Assessment/Plan:  Severe persistent Asthma w/ acute bronchospastic exacerbation  -slowly improving w/ usual tx regimen - but not at baseline and continues to wheeze  -given slow improvement, will add inhaled steroid as well as oral abx -pt does appear stable enough for transfer to medical bed - but not yet stable for d/c home  -Consult to Care Mgmt regarding affordability of Breo; alternatives might be Flovent plus LAMA separately or just Flovent - of note pt is intolerant to Spiriva -followed by Dr. Marchelle Gearing in outpt setting - if sx do not begin to improve, will consider Pulm consult     HTN  -Somewhat low BP at admission - BP now elevated  -continue usual home medical tx and follow - steroids likely exacerbating this problem  -Discontinue atenolol per Pulm note 07/14/15  Hyperglycemia -due to high dose steroid use - no hx of DM - A1c pending   Constipation -tx and follow - no sx to suggest ileus or obstruction   Acute renal insufficiency  -resolved - renal fxn has returned to normal   Code Status: DO NOT INTUBATE Family Communication: no family present at time of exam Disposition Plan: stable for transfer to medical bed   Consultants: none  Procedures: none  Antibiotics: Levaquin 12/11 >  DVT prophylaxis: lovenox   Objective: Blood pressure 155/99, pulse 97, temperature 97.7 F (36.5 C), temperature source Oral, resp. rate 20, height  (1.753 m), weight 95.6 kg (210 lb 12.2 oz), SpO2 99 %.  Intake/Output Summary (Last 24 hours) at 10/03/15 0951 Last data filed at 10/03/15 0800  Gross per 24 hour  Intake   2080 ml  Output   2370 ml  Net   -290 ml   Exam: General: no acute resp distress - able to converse w/o difficulty  Lungs: diffuse exp wheeze audible w/o stethoscope - exp phase not prolonged - no focal crackles  Cardiovascular: RRR without murmur gallop or rub  Abdomen: Nontender, nondistended, soft, bowel sounds positive, no rebound, no ascites, no appreciable mass Extremities: No significant cyanosis, clubbing,edema bilateral lower extremities  Data Reviewed: Basic  Metabolic Panel:  Recent Labs Lab 10/01/15 1710 10/02/15 0430 10/03/15 0300  NA 138 140 139  K 4.4 4.8 4.9  CL 103 104 104  CO2 31 24 28   GLUCOSE 148* 194* 146*  BUN 11 16 16   CREATININE 1.10 1.30* 0.96  CALCIUM 8.9 8.9 9.1    CBC:  Recent Labs Lab 10/01/15 1710 10/02/15 0430 10/03/15 0300  WBC 11.7* 8.7 14.2*  NEUTROABS 5.4  --   --   HGB 12.6* 12.2* 11.3*  HCT 41.7 39.9 36.9*  MCV 87.1 86.7 86.6  PLT 265 256 236    Liver Function  Tests:  Recent Labs Lab 10/01/15 1710 10/02/15 0430  AST 20 24  ALT 20 21  ALKPHOS 53 47  BILITOT 0.7 0.7  PROT 6.8 7.0  ALBUMIN 3.5 3.5    Recent Results (from the past 240 hour(s))  MRSA PCR Screening     Status: None   Collection Time: 10/01/15  9:37 PM  Result Value Ref Range Status   MRSA by PCR NEGATIVE NEGATIVE Final    Comment:        The GeneXpert MRSA Assay (FDA approved for NASAL specimens only), is one component of a comprehensive MRSA colonization surveillance program. It is not intended to diagnose MRSA infection nor to guide or monitor treatment for MRSA infections.      Studies:   Recent x-ray studies have been reviewed in detail by the Attending Physician  Scheduled Meds:  Scheduled Meds: . albuterol  2.5 mg Nebulization Q4H  . amLODipine  10 mg Oral Daily  . cloNIDine  0.1 mg Oral Daily  . enoxaparin (LOVENOX) injection  40 mg Subcutaneous Q24H  . methylPREDNISolone (SOLU-MEDROL) injection  60 mg Intravenous Q12H  . montelukast  10 mg Oral QHS  . polyethylene glycol  17 g Oral Daily  . senna-docusate  1 tablet Oral BID  . sodium chloride  3 mL Intravenous Q12H    Time spent on care of this patient: 35 mins   Derric Dealmeida T , MD   [facility] Office  [phone] Pager - Text Page per Loretha Stapler as per below:  On-Call/Text Page:      [email or web address]      password [email or web address]  If 7PM-7AM, please contact night-coverage [email or web address] Password [email or web address] 10/03/2015, 9:51 AM   LOS: 2 days

## 2015-10-03 NOTE — Progress Notes (Signed)
Pt transferred from 2 heart this pm. Alert, oriented and able to voice needs. No complaints expressed at this time 

## 2015-10-03 NOTE — Progress Notes (Signed)
Transferred to [employer] by wheelchair, stable, report given to RN, belongings with pt.

## 2015-10-04 DIAGNOSIS — J4541 Moderate persistent asthma with (acute) exacerbation: Secondary | ICD-10-CM

## 2015-10-04 DIAGNOSIS — D72829 Elevated white blood cell count, unspecified: Secondary | ICD-10-CM

## 2015-10-04 LAB — HEMOGLOBIN A1C
Hgb A1c MFr Bld: 6.5 % — ABNORMAL HIGH (ref 4.8–5.6)
Mean Plasma Glucose: 140 mg/dL

## 2015-10-04 MED ORDER — CLONIDINE HCL 0.2 MG PO TABS
0.2000 mg | ORAL_TABLET | Freq: Two times a day (BID) | ORAL | Status: DC
  Administered 2015-10-04 – 2015-10-06 (×4): 0.2 mg via ORAL
  Filled 2015-10-04 (×4): qty 1

## 2015-10-04 MED ORDER — METHYLPREDNISOLONE SODIUM SUCC 125 MG IJ SOLR
60.0000 mg | Freq: Four times a day (QID) | INTRAMUSCULAR | Status: DC
  Administered 2015-10-04 – 2015-10-05 (×4): 60 mg via INTRAVENOUS
  Filled 2015-10-04 (×4): qty 2

## 2015-10-04 MED ORDER — HYDRALAZINE HCL 20 MG/ML IJ SOLN
5.0000 mg | INTRAMUSCULAR | Status: DC | PRN
  Administered 2015-10-04 – 2015-10-06 (×2): 5 mg via INTRAVENOUS
  Filled 2015-10-04 (×2): qty 1

## 2015-10-04 NOTE — Progress Notes (Signed)
Pt a/o, c/o pain PRN Tylenol given as ordered, 1x dose Ambien given requested by pt, VSS, pt stable, pt resting comfortably

## 2015-10-04 NOTE — Progress Notes (Addendum)
Patient ID: Evan Brock, male   DOB: [birth year], 64 y.o.   MRN: [id number]  [facility] PROGRESS NOTE  Evan Brock WUJ:[id number] DOB: [birth year] DOA: 10-13-15 PCP: PROVIDER NOT IN SYSTEM   Brief narrative:    Patient is a very pleasant 64 year old male with known COPD and asthma who presented to [facility] ED with main concern of several days duration of progressively worsening dyspnea, mostly with exertion but occasionally present at rest, associated with wheezing and productive cough of clear sputum, no fevers or chills. Off note, patient was seen in emergency department about 3 weeks prior to this admission for similar concern and was discharged on bronchodilators and steroids for 5 days with doxycycline. Patient reports he felt better initially but continued to get worse in the past several days.   In emergency department, patient was dyspneic and has required BiPAP as well as continuous bronchodilators. [facility] asked to admit for evaluation and management.  Assessment/Plan:    Principal Problem:   Acute respiratory failure with hypoxia (HCC) - Secondary to acute on chronic asthma and COPD exacerbation - Slow recovery and current regimen which includes Solu-Medrol 40 mg twice a day IV, DuoNeb 4 times a day and as needed, Pulmicort twice a day - We'll increase Solu-Medrol to 60 mg IV every 6 hours as patient is still coarse with expiratory and inspiratory wheezing on exam - Agree with continuing bronchodilators scheduled and as needed, same regimen - Continue Levaquin day #2/5 - followed by Dr. Marchelle Gearing in outpt setting - if sx do not begin to improve, will consider Pulm consult  - Unable to discharge home yet due to high dose Solu-Medrol requirement  Active Problems:   Acute renal failure (HCC) - Appears to be prerenal in etiology - resolved     Thrombocytopenia, unspecified (HCC) - With platelet count as low as 70 K 2 years ago - Currently normal    Essential  hypertension, benign - Reasonable inpatient control on losartan and clonidine     Leukocytosis - Likely steroid-induced, continue to monitor    Obesity - Body mass index is 31.11 kg/(m^2)    Hyperglycemia - due to high dose steroid use - no hx of DM - A1c 6.5  DVT prophylaxis - Lovenox SQ  Code Status: partial code, ACLS OK all except intubation  Family Communication:  plan of care discussed with the patient Disposition Plan: Unable to discharge home yet due to high dose Solu-Medrol requirement  IV access:  Peripheral IV  Procedures and diagnostic studies:    Dg Chest Port 1 View Oct 13, 2015  No active disease.   Dg Chest Portable  1 View 09/09/2015  No acute abnormalities.   Medical Consultants:  None   Other Consultants:  None   IAnti-Infectives:   Levaquin 12/11 -->  Debbora Presto, MD  [facility] Pager [phone]  If 7PM-7AM, please contact night-coverage [email or web address] Password [facility] 10/04/2015, 2:49 PM   LOS: 3 days   HPI/Subjective: No events overnight.   Objective: Filed Vitals:   10/03/15 1204 10/03/15 1350 10/03/15 2119 10/04/15 0628  BP: 143/86 154/103 150/74 168/94  Pulse: 86 74 103 100  Temp: 97.8 F (36.6 C) 97.5 F (36.4 C) 97.6 F (36.4 C) 98.1 F (36.7 C)  TempSrc: Oral Oral Oral   Resp: Height:      Weight:      SpO2: 98% 100% 93% 92%    Intake/Output Summary (Last 24 hours) at 10/04/15 1449 Last data filed at 10/04/15  0900  Gross per 24 hour  Intake    240 ml  Output   1550 ml  Net  -1310 ml    Exam:   General:  Pt is alert, follows commands appropriately, not in acute distress  Cardiovascular: Regular rhythm, tachycardic, no rubs, no gallops  Respiratory: Course breath sounds bilaterally with ins and exp wheezing mostly in upper lobes   Abdomen: Soft, non tender, non distended, bowel sounds present, no guarding  Extremities: No edema, pulses DP and PT palpable bilaterally   Data Reviewed: Basic  Metabolic Panel:  Recent Labs Lab 10/01/15 1710 10/02/15 0430 10/03/15 0300  NA 138 140 139  K 4.4 4.8 4.9  CL 103 104 104  CO2 GLUCOSE 148* 194* 146*  BUN CREATININE 1.10 1.30* 0.96  CALCIUM 8.9 8.9 9.1   Liver Function Tests:  Recent Labs Lab 10/01/15 1710 10/02/15 0430  AST 20 24  ALT 20 21  ALKPHOS 53 47  BILITOT 0.7 0.7  PROT 6.8 7.0  ALBUMIN 3.5 3.5   CBC:  Recent Labs Lab 10/01/15 1710 10/02/15 0430 10/03/15 0300  WBC 11.7* 8.7 14.2*  NEUTROABS 5.4  --   --   HGB 12.6* 12.2* 11.3*  HCT 41.7 39.9 36.9*  MCV 87.1 86.7 86.6  PLT 265 256 236   Recent Results (from the past 240 hour(s))  MRSA PCR Screening     Status: None   Collection Time: 10/01/15  9:37 PM  Result Value Ref Range Status   MRSA by PCR NEGATIVE NEGATIVE Final     Scheduled Meds: . amLODipine  10 mg Oral Daily  . budesonide  nebulizer   0.25 mg Nebulization BID  . cloNIDine  0.1 mg Oral Daily  . enoxaparin  injection  40 mg Subcutaneous Q24H  . ipratropium-albuterol  3 mL Nebulization QID  . levofloxacin  750 mg Oral Daily  . losartan  50 mg Oral Daily  . methylPREDNISolone (SOLU-MEDROL) injection  60 mg Intravenous Q6H  . montelukast  10 mg Oral QHS  . terazosin  2 mg Oral Daily   Continuous Infusions: . sodium chloride 10 mL/hr at 10/03/15 1025

## 2015-10-04 NOTE — Care Management Note (Addendum)
Case Management Note  Patient Details  Name: Evan Brock MRN: [id number] Date of Birth: May 21, 1951  Subjective/Objective:                    Action/Plan:  Patient and wife discussed home health , requesting [facility] . Referral given to [facility] with [facility].  Discussed home health with patient . Confirmed face sheet information , cell number is correct, there is no home number . Provided patient list of home health agencies . Patient will discuss choice with his wife this am and let nurse know. Will follow up .    Provided patient with phone number for [employer] assistance program.  Expected Discharge Date:                Expected Discharge Plan:  Home w Home Health Services  In-House Referral:     Discharge planning Services  CM Consult  Post Acute Care Choice:  Home Health Choice offered to:     DME Arranged:    DME Agency:     HH Arranged:    HH Agency:     Status of Service:  In process, will continue to follow  Medicare Important Message Given:    Date Medicare IM Given:    Medicare IM give by:    Date Additional Medicare IM Given:    Additional Medicare Important Message give by:     If discussed at Long Length of Stay Meetings, dates discussed:    Additional Comments:  Kingsley Plan, RN 10/04/2015, 10:24 AM

## 2015-10-04 NOTE — Care Management Important Message (Signed)
Important Message  Patient Details  Name: Evan Brock MRN: [id number] Date of Birth: 01/22/1951   Medicare Important Message Given:  Yes    [name] Jaysha Lasure 10/04/2015, 3:38 PM

## 2015-10-04 NOTE — Progress Notes (Signed)
Patients vital signs checked earlier around 1400. Blood pressure read 177/103. MD notified. Administered patient prn  Hydralazine  IV. Rechecked blood pressure reading was 164/90. Patient has no signs/symptoms of distress. Will continue to monitor.

## 2015-10-04 NOTE — Progress Notes (Signed)
SATURATION QUALIFICATIONS: (This note is used to comply with regulatory documentation for home oxygen)  Patient Saturations on Room Air at Rest = 96%  Patient Saturations on Room Air while Ambulating = 95%  Patient Saturations on 0 Liters of oxygen while Ambulating = 93%  Please briefly explain why patient needs home oxygen:

## 2015-10-05 LAB — BASIC METABOLIC PANEL
ANION GAP: 9 (ref 5–15)
BUN: 21 mg/dL — ABNORMAL HIGH (ref 6–20)
CALCIUM: 9.2 mg/dL (ref 8.9–10.3)
CO2: 28 mmol/L (ref 22–32)
Chloride: 101 mmol/L (ref 101–111)
Creatinine, Ser: 1.12 mg/dL (ref 0.61–1.24)
Glucose, Bld: 187 mg/dL — ABNORMAL HIGH (ref 65–99)
POTASSIUM: 4.3 mmol/L (ref 3.5–5.1)
SODIUM: 138 mmol/L (ref 135–145)

## 2015-10-05 MED ORDER — ZOLPIDEM TARTRATE 5 MG PO TABS
5.0000 mg | ORAL_TABLET | Freq: Once | ORAL | Status: AC
  Administered 2015-10-05: 5 mg via ORAL
  Filled 2015-10-05: qty 1

## 2015-10-05 MED ORDER — GUAIFENESIN ER 600 MG PO TB12
1200.0000 mg | ORAL_TABLET | Freq: Two times a day (BID) | ORAL | Status: DC
  Administered 2015-10-05 – 2015-10-06 (×3): 1200 mg via ORAL
  Filled 2015-10-05 (×4): qty 2

## 2015-10-05 MED ORDER — METHYLPREDNISOLONE SODIUM SUCC 125 MG IJ SOLR
60.0000 mg | Freq: Two times a day (BID) | INTRAMUSCULAR | Status: DC
  Administered 2015-10-05 – 2015-10-06 (×2): 60 mg via INTRAVENOUS
  Filled 2015-10-05 (×2): qty 2

## 2015-10-05 MED ORDER — GUAIFENESIN-DM 100-10 MG/5ML PO SYRP
5.0000 mL | ORAL_SOLUTION | Freq: Four times a day (QID) | ORAL | Status: DC | PRN
  Administered 2015-10-05 – 2015-10-06 (×4): 5 mL via ORAL
  Filled 2015-10-05 (×4): qty 5

## 2015-10-05 NOTE — Progress Notes (Signed)
Patient ID: Evan Brock, male   DOB: Feb 17, 1951, 64 y.o.   MRN: [id number]  [facility] PROGRESS NOTE  Evan Brock OZY:[id number] DOB: 16-Apr-1951 DOA: October 21, 2015  PCP: Pt sees Dr. Marchelle Brock pulmonologist. Case Manager consulted for assistance to set up with PCP.     Brief narrative:    Patient is a very pleasant 64 year old male with known COPD and asthma who presented to [facility] ED with main concern of several days duration of progressively worsening dyspnea, mostly with exertion but occasionally present at rest, associated with wheezing and productive cough of clear sputum, no fevers or chills. Off note, patient was seen in emergency department about 3 weeks prior to this admission for similar concern and was discharged on bronchodilators and steroids for 5 days with doxycycline. Patient reports he felt better initially but continued to get worse in the past several days.   In emergency department, patient was dyspneic and has required BiPAP as well as continuous bronchodilators. TR[facility] asked to admit for evaluation and management.  Assessment/Plan:    Principal Problem:   Acute respiratory failure with hypoxia (HCC) - Secondary to acute on chronic asthma and COPD exacerbation - Slow recovery, better in the past 24 hours when solumedrol was increased to 60 mg IV Q6 hours (increased 12/12) - taper down solumedrol to 60 mg IV BID today 12/13, continue with duonebs 4 x per day and as needed dosing  - also continue Pulmicort nebulizer BID, mucinex 1200 mg BID - added robitussin as needed 12/13 - Continue Levaquin day #3/5 - followed by Dr. Marchelle Brock in outpt setting, order placed to have an appointment scheduled  - Unable to discharge home yet due to IV steroids requirement  - oxygen saturation assessed at rest and with ambulation, please note pt does not meed criteria for outpatient oxygen  - upon discharge, pt will need longer prednisone taper course, at least 7 to 10 days   Active  Problems:   Acute renal failure (HCC) - Appears to be prerenal in etiology - resolved with IVF    Thrombocytopenia, unspecified (HCC) - With platelet count as low as 70 K, 2 years ago - has been WNL this hospital stay     Essential hypertension, benign - currently on losartan 50  Mg QD, Clonidine 0.2 mg BID, Norvasc 10 mg QD - SBP slightly above target range and likely exacerbated by steroid use - added hydralazine as needed for better control     Leukocytosis - Likely steroid - induced - CBC in AM    Obesity - Body mass index is 31.11 kg/(m^2)    Hyperglycemia - due to high dose steroid use  - no hx of DM  - A1c 6.5  DVT prophylaxis - Lovenox SQ  Code Status: partial code, ACLS OK all except intubation  Family Communication:  plan of care discussed with the patient Disposition Plan: Unable to discharge home yet due to IV Solu-Medrol requirement, if able to take off of Solumedrol in next 24 hours, would be OK with discharge   IV access:  Peripheral IV  Procedures and diagnostic studies:    Dg Chest Port 1 View 21-Oct-2015  No active disease.   Dg Chest Portable  1 View 09/09/2015  No acute abnormalities.   Medical Consultants:  None   Other Consultants:  None   IAnti-Infectives:   Levaquin 12/11 --> 12/15  Evan Presto, MD  [facility] Pager [phone]  If 7PM-7AM, please contact night-coverage [email or web address] Password [email or web address] 10/05/2015, 11:07 AM  LOS: 4 days   HPI/Subjective: No events overnight. Pt reports feeling better, still with mucous, productive cough of yellow sputum.   Objective: Filed Vitals:   10/04/15 2015 10/04/15 2115 10/05/15 0533 10/05/15 0845  BP:  147/94 145/86   Pulse: 104 109 96 109  Temp:  98.2 F (36.8 C) 97.6 F (36.4 C)   TempSrc:  Oral Oral   Resp: Height:      Weight:      SpO2:  95% 96% 96%    Intake/Output Summary (Last 24 hours) at 10/05/15 1107 Last data filed at 10/05/15 0900  Gross per 24 hour   Intake    720 ml  Output    250 ml  Net    470 ml    Exam:   General:  Pt is alert, follows commands appropriately, not in acute distress  Cardiovascular: Regular rhythm, tachycardic, no rubs, no gallops  Respiratory: Better air movement this AM, no inspiratory wheezing but expiratory wheezing still present and mostly in the upper lobes R > L  Abdomen: Soft, non tender, non distended, bowel sounds present, no guarding  Extremities: pulses DP and PT palpable bilaterally   Data Reviewed: Basic Metabolic Panel:  Recent Labs Lab 10/01/15 1710 10/02/15 0430 10/03/15 0300  NA 138 140 139  K 4.4 4.8 4.9  CL 103 104 104  CO2 GLUCOSE 148* 194* 146*  BUN CREATININE 1.10 1.30* 0.96  CALCIUM 8.9 8.9 9.1   Liver Function Tests:  Recent Labs Lab 10/01/15 1710 10/02/15 0430  AST 20 24  ALT 20 21  ALKPHOS 53 47  BILITOT 0.7 0.7  PROT 6.8 7.0  ALBUMIN 3.5 3.5   CBC:  Recent Labs Lab 10/01/15 1710 10/02/15 0430 10/03/15 0300  WBC 11.7* 8.7 14.2*  NEUTROABS 5.4  --   --   HGB 12.6* 12.2* 11.3*  HCT 41.7 39.9 36.9*  MCV 87.1 86.7 86.6  PLT 265 256 236   Recent Results (from the past 240 hour(s))  MRSA PCR Screening     Status: None   Collection Time: 10/01/15  9:37 PM  Result Value Ref Range Status   MRSA by PCR NEGATIVE NEGATIVE Final     . amLODipine  10 mg Oral Daily  . budesonide (PULMICORT)   0.25 mg Nebulization BID  . cloNIDine  0.2 mg Oral BID  . enoxaparin  injection  40 mg Subcutaneous Q24H  . guaiFENesin  1,200 mg Oral BID  . ipratropium-albuterol  3 mL Nebulization QID  . levofloxacin  750 mg Oral Daily  . losartan  50 mg Oral Daily  . methylPREDNISolone (SOLU-MEDROL) injection  60 mg Intravenous Q12H  . montelukast  10 mg Oral QHS  . polyethylene glycol  17 g Oral Daily  . senna-docusate  1 tablet Oral BID  . terazosin  2 mg Oral Daily     Continuous Infusions: . sodium chloride 10 mL/hr at 10/03/15 1025

## 2015-10-05 NOTE — Care Management (Signed)
Patient's PCP is Dr Rigoberto Noel . Ronny Flurry RN BSN [phone]

## 2015-10-05 NOTE — Care Management Note (Signed)
Case Management Note  Patient Details  Name: Evan Brock MRN: [id number] Date of Birth: 1950/11/25  Subjective/Objective:                    Action/Plan:   Expected Discharge Date:  10/03/15               Expected Discharge Plan:  Home w Home Health Services  In-House Referral:     Discharge planning Services  CM Consult  Post Acute Care Choice:  Home Health Choice offered to:  Patient  DME Arranged:  Nebulizer machine DME Agency:  [facility]  HH Arranged:  RN [facility] Agency:  [facility]  Status of Service:  Completed, signed off  Medicare Important Message Given:  Yes Date Medicare IM Given:    Medicare IM give by:    Date Additional Medicare IM Given:    Additional Medicare Important Message give by:     If discussed at Long Length of Stay Meetings, dates discussed:    Additional Comments:  Kingsley Plan, RN 10/05/2015, 4:32 PM

## 2015-10-06 DIAGNOSIS — J9601 Acute respiratory failure with hypoxia: Secondary | ICD-10-CM

## 2015-10-06 DIAGNOSIS — J441 Chronic obstructive pulmonary disease with (acute) exacerbation: Principal | ICD-10-CM

## 2015-10-06 DIAGNOSIS — N179 Acute kidney failure, unspecified: Secondary | ICD-10-CM

## 2015-10-06 DIAGNOSIS — I10 Essential (primary) hypertension: Secondary | ICD-10-CM

## 2015-10-06 DIAGNOSIS — D696 Thrombocytopenia, unspecified: Secondary | ICD-10-CM

## 2015-10-06 LAB — CBC
HEMATOCRIT: 41.3 % (ref 39.0–52.0)
HEMOGLOBIN: 13.2 g/dL (ref 13.0–17.0)
MCH: 26.9 pg (ref 26.0–34.0)
MCHC: 32 g/dL (ref 30.0–36.0)
MCV: 84.1 fL (ref 78.0–100.0)
Platelets: 245 10*3/uL (ref 150–400)
RBC: 4.91 MIL/uL (ref 4.22–5.81)
RDW: 15.4 % (ref 11.5–15.5)
WBC: 11.8 10*3/uL — ABNORMAL HIGH (ref 4.0–10.5)

## 2015-10-06 MED ORDER — GUAIFENESIN-DM 100-10 MG/5ML PO SYRP: 5.0000 mL | ORAL_SOLUTION | Freq: Four times a day (QID) | ORAL | Status: DC | PRN

## 2015-10-06 MED ORDER — PREDNISONE 10 MG PO TABS: ORAL_TABLET | ORAL | Status: DC

## 2015-10-06 MED ORDER — ATENOLOL 50 MG PO TABS: 50.0000 mg | ORAL_TABLET | Freq: Every day | ORAL | Status: DC

## 2015-10-06 MED ORDER — IPRATROPIUM-ALBUTEROL 0.5-2.5 (3) MG/3ML IN SOLN: 3.0000 mL | Freq: Three times a day (TID) | RESPIRATORY_TRACT | Status: DC

## 2015-10-06 MED ORDER — LEVOFLOXACIN 750 MG PO TABS: 750.0000 mg | ORAL_TABLET | Freq: Every day | ORAL | Status: DC

## 2015-10-06 NOTE — Progress Notes (Signed)
Discussed discharge summary with patient. Reviewed all medications with patient. Patient ready for discharge.  

## 2015-10-06 NOTE — Care Management Note (Signed)
Case Management Note  Patient Details  Name: Evan Brock MRN: [id number] Date of Birth: 08-Dec-1950  Subjective/Objective:                    Action/Plan:   Expected Discharge Date:  10/03/15               Expected Discharge Plan:  Home w Home Health Services  In-House Referral:     Discharge planning Services  CM Consult  Post Acute Care Choice:  Home Health Choice offered to:  Patient  DME Arranged:  Nebulizer machine DME Agency:  [facility]  HH Arranged:  RN [facility] Agency:  [facility]  Status of Service:  Completed, signed off  Medicare Important Message Given:  Yes Date Medicare IM Given:    Medicare IM give by:    Date Additional Medicare IM Given:    Additional Medicare Important Message give by:     If discussed at Long Length of Stay Meetings, dates discussed:    Additional Comments:  Kingsley Plan, RN 10/06/2015, 2:08 PM

## 2015-10-06 NOTE — Discharge Summary (Signed)
Discharge Summary  Meet Evan Brock ZOX:[id number] DOB: August 20, 1951  PCP: PROVIDER NOT IN SYSTEM  Admit date: 10/01/2015 Discharge date: 10/06/2015  Time spent: 25 minutes   Recommendations for Outpatient Follow-up:  1. New medication: Levaquin 750 mg by mouth daily 4 more days 2. New medication: Prednisone taper over the next 10 days 3. Patient will follow up with PCP in the next 2 weeks  4. New medication: Robitussin-DM when necessary  Discharge Diagnoses:  Active Hospital Problems   Diagnosis Date Noted  . Acute respiratory failure (HCC) with hypoxia 10/01/2013  . Leukocytosis 10/04/2015  . COPD exacerbation (HCC) 10/01/2015  . Essential hypertension, benign 03/01/2015  . Asthma with acute exacerbation 01/26/2015  . Thrombocytopenia, unspecified (HCC) 11/06/2013  . Acute renal failure (HCC) 10/01/2013    Resolved Hospital Problems   Diagnosis Date Noted Date Resolved  No resolved problems to display.    Discharge Condition: Improved, being discharged home  Diet recommendation: Low-sodium diet   Filed Weights   10/01/15 1824 10/01/15 2144  Weight: 95.255 kg (210 lb) 95.6 kg (210 lb 12.2 oz)    History of present illness:  64 year old male past history of COPD and asthma admitted on 12/94 several days of progressively worsening dyspnea on exertion with wheezing and productive cough and felt to be in COPD exacerbation. Patient found to be hypoxic on room air  Hospital Course:  Principal Problem:   Acute respiratory failure (HCC) with hypoxia secondary to COPD exacerbation along with chronic asthma: Patient aggressively treated with oxygen, nebulizers, steroids and antibiotics. Initially improved but then still required significant IV steroids. By 12/14, patient's lung exam clear although he did have some upper airway noise. Discharged on by mouth antibiotics, by mouth steroids, nebulizers. No longer requiring oxygen Active Problems:   Acute renal failure (HCC)/acute kidney  injury: Looks to be prerenal from dehydration. Result of IV fluids.   Thrombocytopenia, unspecified (HCC): Normal during this hospitalization, previously low counts    Essential hypertension, benign: Blood pressure stable. By mouth medications resumed    Procedures:  None  Consultations:  None   Discharge Exam: BP 154/94 mmHg  Pulse 97  Temp(Src) 98.2 F (36.8 C) (Oral)  Resp 18  Ht  (1.753 m)  Wt 95.6 kg (210 lb 12.2 oz)  BMI 31.11 kg/m2  SpO2 98%  General: Alert and oriented 3  Cardiovascular: Regular rate and rhythm, S1-S2  Respiratory: Decreased breath sounds throughout, no wheezing   Discharge Instructions You were cared for by a hospitalist during your hospital stay. If you have any questions about your discharge medications or the care you received while you were in the hospital after you are discharged, you can call the unit and asked to speak with the hospitalist on call if the hospitalist that took care of you is not available. Once you are discharged, your primary care physician will handle any further medical issues. Please note that NO REFILLS for any discharge medications will be authorized once you are discharged, as it is imperative that you return to your primary care physician (or establish a relationship with a primary care physician if you do not have one) for your aftercare needs so that they can reassess your need for medications and monitor your lab values.  Discharge Instructions    Diet - low sodium heart healthy    Complete by:  As directed      Increase activity slowly    Complete by:  As directed  Medication List    TAKE these medications        albuterol 108 (90 BASE) MCG/ACT inhaler  Commonly known as:  PROVENTIL HFA;VENTOLIN HFA  1-2 puffs q 6 hours x 3 days then prn SOB     amLODipine 10 MG tablet  Commonly known as:  NORVASC  Take 10 mg by mouth daily.     atenolol 50 MG tablet  Commonly known as:  TENORMIN    Take 50 mg by mouth daily.     cloNIDine 0.1 MG tablet  Commonly known as:  CATAPRES  Take 1 tablet by mouth daily.     cyclobenzaprine 10 MG tablet  Commonly known as:  FLEXERIL  Take 10 mg by mouth 3 (three) times daily as needed for muscle spasms.     Fluticasone Furoate-Vilanterol 200-25 MCG/INH Aepb  Commonly known as:  BREO ELLIPTA  Inhale 1 puff into the lungs daily.     guaiFENesin-dextromethorphan 100-10 MG/5ML syrup  Commonly known as:  ROBITUSSIN DM  Take 5 mLs by mouth every 6 (six) hours as needed for cough (to loosen phlegm).     ipratropium-albuterol 0.5-2.5 (3) MG/3ML Soln  Commonly known as:  DUONEB  Take 3 mLs by nebulization every 4 (four) hours as needed. Shortness of breathe     levofloxacin 750 MG tablet  Commonly known as:  LEVAQUIN  Take 1 tablet (750 mg total) by mouth daily.     losartan 50 MG tablet  Commonly known as:  COZAAR  TAKE ONE TABLET BY MOUTH DAILY     montelukast 10 MG tablet  Commonly known as:  SINGULAIR  TAKE ONE TABLET BY MOUTH AT BEDTIME     predniSONE 10 MG tablet  Commonly known as:  DELTASONE  50 mg po daily x 2 days, then 40mg  po daily x 2 days, then decrease by 10mg  every 2 days until finished.     terazosin 2 MG capsule  Commonly known as:  HYTRIN  Take 2 mg by mouth daily.       No Known Allergies    The results of significant diagnostics from this hospitalization (including imaging, microbiology, ancillary and laboratory) are listed below for reference.    Significant Diagnostic Studies: Dg Chest Port 1 View  10/01/2015  CLINICAL DATA:  Shortness of breath, COPD EXAM: PORTABLE CHEST 1 VIEW COMPARISON:  09/09/2015 FINDINGS: The heart size and mediastinal contours are within normal limits. Both lungs are clear. The visualized skeletal structures are unremarkable. IMPRESSION: No active disease. Electronically Signed   By: Natasha Mead M.D.   On: 10/01/2015 17:55   Dg Chest Portable 1 View  09/09/2015  CLINICAL  DATA:  Shortness of breath, asthma attack, cough for 1 week, hypertension, former smoker EXAM: PORTABLE CHEST 1 VIEW COMPARISON:  Portable exam 1030 hours compared to 10/01/2013 FINDINGS: Lordotic positioning with slight rotation to the RIGHT. Normal heart size, mediastinal contours, and pulmonary vascularity. Lungs clear. No pleural effusion or pneumothorax. Bones unremarkable. IMPRESSION: No acute abnormalities. Electronically Signed   By: Ulyses Southward M.D.   On: 09/09/2015 10:36    Microbiology: Recent Results (from the past 240 hour(s))  MRSA PCR Screening     Status: None   Collection Time: 10/01/15  9:37 PM  Result Value Ref Range Status   MRSA by PCR NEGATIVE NEGATIVE Final    Comment:        The GeneXpert MRSA Assay (FDA approved for NASAL specimens only), is one component of a comprehensive  MRSA colonization surveillance program. It is not intended to diagnose MRSA infection nor to guide or monitor treatment for MRSA infections.      Labs: Basic Metabolic Panel:  Recent Labs Lab 10/01/15 1710 10/02/15 0430 10/03/15 0300 10/05/15 1505  NA 138 140 139 138  K 4.4 4.8 4.9 4.3  CL 103 104 104 101  CO2 GLUCOSE 148* 194* 146* 187*  BUN 21*  CREATININE 1.10 1.30* 0.96 1.12  CALCIUM 8.9 8.9 9.1 9.2   Liver Function Tests:  Recent Labs Lab 10/01/15 1710 10/02/15 0430  AST 20 24  ALT 20 21  ALKPHOS 53 47  BILITOT 0.7 0.7  PROT 6.8 7.0  ALBUMIN 3.5 3.5   No results for input(s): LIPASE, AMYLASE in the last 168 hours. No results for input(s): AMMONIA in the last 168 hours. CBC:  Recent Labs Lab 10/01/15 1710 10/02/15 0430 10/03/15 0300 10/06/15 0625  WBC 11.7* 8.7 14.2* 11.8*  NEUTROABS 5.4  --   --   --   HGB 12.6* 12.2* 11.3* 13.2  HCT 41.7 39.9 36.9* 41.3  MCV 87.1 86.7 86.6 84.1  PLT 265 256 236 245   Cardiac Enzymes: No results for input(s): CKTOTAL, CKMB, CKMBINDEX, TROPONINI in the last 168 hours. BNP: BNP (last 3  results)  Recent Labs  10/01/15 1710  BNP 20.2    ProBNP (last 3 results) No results for input(s): PROBNP in the last 8760 hours.  CBG: No results for input(s): GLUCAP in the last 168 hours.     Signed:  Hollice Espy  [facility] 10/06/2015, 5:45 PM

## 2015-10-19 ENCOUNTER — Telehealth: Payer: Self-pay | Admitting: Internal Medicine

## 2015-10-19 NOTE — Telephone Encounter (Signed)
Called and spoke with [place] from [place]. She wanted to inform MR that the patient was hospitalized on 10/01/15 through 10/06/15 for COPD exacerbation. She stated that he is doing well since his discharge on 10/06/15 and she wanted to make sure that MR was aware that his home health services will end on 10/21/15. I explained to her that MR was out of the office until 10/26/14 but I would send a message to [place] as FYI. She explained if MR had any questions or concerns to contact her at [phone]. She voiced understanding and had no further questions. Will send message to [facility]

## 2015-10-20 NOTE — Telephone Encounter (Signed)
Will forward to MR as an FYI 

## 2015-10-27 NOTE — Telephone Encounter (Signed)
Given recent admission he will need to see an NP/APP < 7 -10 days from 10/27/2015

## 2015-10-27 NOTE — Telephone Encounter (Signed)
Patient is scheduled to see MR on 01/17/16.  Is this okay or do we need to get patient in sooner?  MR - please advise.

## 2015-10-27 NOTE — Telephone Encounter (Signed)
ATC, NA and unable to leave msg for the pt on cell  Home number has been D/C'ed

## 2015-10-27 NOTE — Telephone Encounter (Signed)
Please ensure he has fu with TP/or me whoever is earlier for fu of asthma ae

## 2015-10-27 NOTE — Telephone Encounter (Signed)
Pt has ov 01/17/15

## 2015-11-30 ENCOUNTER — Encounter: Payer: Medicare Other | Admitting: Gastroenterology

## 2016-01-17 ENCOUNTER — Ambulatory Visit: Payer: Medicare Other | Admitting: Internal Medicine

## 2017-07-12 ENCOUNTER — Encounter: Payer: Self-pay | Admitting: Gastroenterology

## 2017-07-12 NOTE — Telephone Encounter (Signed)
Error

## 2017-08-22 ENCOUNTER — Encounter: Payer: Self-pay | Admitting: Specialist

## 2017-09-11 ENCOUNTER — Encounter: Payer: Self-pay | Admitting: Specialist

## 2017-10-04 ENCOUNTER — Encounter: Payer: Self-pay | Admitting: Specialist

## 2017-10-30 ENCOUNTER — Ambulatory Visit (HOSPITAL_COMMUNITY)
Admission: EM | Admit: 2017-10-30 | Discharge: 2017-10-30 | Disposition: A | Discharge status: Home / Self Care | Payer: Medicare Other | Attending: Internal Medicine | Admitting: Internal Medicine

## 2017-10-30 ENCOUNTER — Encounter (HOSPITAL_COMMUNITY): Payer: Self-pay | Admitting: Emergency Medicine

## 2017-10-30 DIAGNOSIS — J441 Chronic obstructive pulmonary disease with (acute) exacerbation: Secondary | ICD-10-CM | POA: Diagnosis not present

## 2017-10-30 DIAGNOSIS — R0602 Shortness of breath: Secondary | ICD-10-CM

## 2017-10-30 DIAGNOSIS — R05 Cough: Secondary | ICD-10-CM

## 2017-10-30 DIAGNOSIS — R062 Wheezing: Secondary | ICD-10-CM | POA: Diagnosis not present

## 2017-10-30 DIAGNOSIS — I10 Essential (primary) hypertension: Secondary | ICD-10-CM | POA: Diagnosis not present

## 2017-10-30 MED ORDER — METHYLPREDNISOLONE SODIUM SUCC 125 MG IJ SOLR
125.0000 mg | Freq: Once | INTRAMUSCULAR | Status: AC
  Administered 2017-10-30: 125 mg via INTRAMUSCULAR

## 2017-10-30 MED ORDER — PREDNISONE 50 MG PO TABS: 50.0000 mg | ORAL_TABLET | Freq: Every day | ORAL | 0 refills | Status: AC

## 2017-10-30 MED ORDER — IPRATROPIUM-ALBUTEROL 0.5-2.5 (3) MG/3ML IN SOLN
RESPIRATORY_TRACT | Status: AC
  Filled 2017-10-30: qty 3

## 2017-10-30 MED ORDER — IPRATROPIUM-ALBUTEROL 0.5-2.5 (3) MG/3ML IN SOLN
3.0000 mL | Freq: Once | RESPIRATORY_TRACT | Status: AC
  Administered 2017-10-30: 3 mL via RESPIRATORY_TRACT

## 2017-10-30 MED ORDER — METHYLPREDNISOLONE SODIUM SUCC 125 MG IJ SOLR
INTRAMUSCULAR | Status: AC
  Filled 2017-10-30: qty 2

## 2017-10-30 MED ORDER — LOSARTAN POTASSIUM 50 MG PO TABS: 50.0000 mg | ORAL_TABLET | Freq: Every day | ORAL | 0 refills | Status: DC

## 2017-10-30 MED ORDER — AZITHROMYCIN 250 MG PO TABS: 250.0000 mg | ORAL_TABLET | Freq: Every day | ORAL | 0 refills | Status: DC

## 2017-10-30 MED ORDER — IPRATROPIUM-ALBUTEROL 0.5-2.5 (3) MG/3ML IN SOLN: 3.0000 mL | RESPIRATORY_TRACT | 0 refills | Status: DC | PRN

## 2017-10-30 NOTE — Discharge Instructions (Signed)
You received 2 breathing treatment in office today, one prednisone injection.  I am treating you for COPD exacerbation, start azithromycin and prednisone as directed.  I have refilled your albuterol nebulizer solution.  I also refilled your losartan, please start taking as directed, check her blood pressure daily.  Follow-up with your new PCP as scheduled next week for reevaluation and further management of your chronic illnesses. Monitor for any worsening of symptoms, chest pain, shortness of breath, wheezing, swelling of the throat, follow up for reevaluation.

## 2017-10-30 NOTE — ED Provider Notes (Signed)
[facility]    CSN: [id number] Arrival date & time: 10/30/17  1007     History   Chief Complaint Chief Complaint  Patient presents with  . Asthma    HPI Evan Brock is a 67 y.o. male.   66 year old male with history of COPD, HTN, asthma, comes in for 5-day history of URI symptoms.  He has had productive cough, rhinorrhea, congestion.  He has had increased wheezing  and shortness of breath with increased use of albuterol nebulizer.  States he ran out of nebulizer solution 3 days ago, and has been increasingly short of breath since then.  He has not taking anything else for his symptoms.  Former smoker, has quit 30 years ago.   Patient with 193/115 blood pressure during triage, which improved after nebulizer treatment although still hypertensive.  Patient states he ran out of blood pressure medicine end of December.  He has a new PCP appointment in a week.  He denies chest pain, headache, vision change, dizziness, weakness.      Past Medical History:  Diagnosis Date  . Arthritis    "hips" (10/01/2013)  . Asthma   . COPD (chronic obstructive pulmonary disease) (HCC)   . Exertional shortness of breath   . Hypertension     Patient Active Problem List   Diagnosis Date Noted  . Leukocytosis 10/04/2015  . COPD exacerbation (HCC) 10/01/2015  . Essential hypertension, benign 03/01/2015  . Asthma with acute exacerbation 01/26/2015  . Thrombocytopenia, unspecified (HCC) 11/06/2013  . Acute respiratory failure (HCC) with hypoxia 10/01/2013  . Acute renal failure (HCC) 10/01/2013    Past Surgical History:  Procedure Laterality Date  . JOINT REPLACEMENT    . TOTAL HIP ARTHROPLASTY Right 2010  . TOTAL HIP ARTHROPLASTY Left 08/19/2013   Procedure: TOTAL HIP ARTHROPLASTY ANTERIOR APPROACH;  Surgeon: Velna Ochs, MD;  Location: [facility] OR;  Service: Orthopedics;  Laterality: Left;       Home Medications    Prior to Admission medications   Medication Sig Start  Date End Date Taking? Authorizing Provider  albuterol (PROVENTIL HFA;VENTOLIN HFA) 108 (90 BASE) MCG/ACT inhaler 1-2 puffs q 6 hours x 3 days then prn SOB 10/26/13   Young, Dillard Cannon, PA-C  amLODipine (NORVASC) 10 MG tablet Take 10 mg by mouth daily.    [provider]  atenolol (TENORMIN) 50 MG tablet Take 50 mg by mouth daily.    [provider]  azithromycin (ZITHROMAX) 250 MG tablet Take 1 tablet (250 mg total) by mouth daily. Take first 2 tablets together, then 1 every day until finished. 10/30/17   Cathie Hoops, Choya Tornow V, PA-C  cloNIDine (CATAPRES) 0.1 MG tablet Take 1 tablet by mouth daily. 03/19/15   [provider]  cyclobenzaprine (FLEXERIL) 10 MG tablet Take 10 mg by mouth 3 (three) times daily as needed for muscle spasms.    [provider]  Fluticasone Furoate-Vilanterol (BREO ELLIPTA) 200-25 MCG/INH AEPB Inhale 1 puff into the lungs daily. 03/01/15   Kalman Shan, MD  guaiFENesin-dextromethorphan (ROBITUSSIN DM) 100-10 MG/5ML syrup Take 5 mLs by mouth every 6 (six) hours as needed for cough (to loosen phlegm). 10/06/15   Hollice Espy, MD  ipratropium-albuterol (DUONEB) 0.5-2.5 (3) MG/3ML SOLN Take 3 mLs by nebulization every 4 (four) hours as needed. Shortness of breathe 10/30/17   Cathie Hoops, Zuri Lascala V, PA-C  losartan (COZAAR) 50 MG tablet Take 1 tablet (50 mg total) by mouth daily. 10/30/17   Cathie Hoops, Jenefer Woerner V, PA-C  montelukast (  SINGULAIR) 10 MG tablet TAKE ONE TABLET BY MOUTH AT BEDTIME 03/19/15   Kalman Shan, MD  predniSONE (DELTASONE) 50 MG tablet Take 1 tablet (50 mg total) by mouth daily for 4 days. 10/30/17 11/03/17  Cathie Hoops, Summers Buendia V, PA-C  terazosin (HYTRIN) 2 MG capsule Take 2 mg by mouth daily.    [provider]    Family History Family History  Problem Relation Age of Onset  . Diabetes Mother   . Hypertension Father   . Lupus Sister     Social History Social History   Tobacco Use  . Smoking status: Former Smoker    Packs/day: 0.50    Years: 2.00     Pack years: 1.00    Types: Cigarettes    Last attempt to quit: 10/23/1990    Years since quitting: 27.0  . Smokeless tobacco: Never Used  . Tobacco comment: 10/01/2013 "quit smoking cigarettes~ 15-20 yr ago"  Substance Use Topics  . Alcohol use: Yes    Alcohol/week: 7.2 oz    Types: 12 Cans of beer per week    Comment: 08/19/2013 "3 times/wk; 3-4 beers/night"  . Drug use: Yes    Types: Marijuana    Comment: 10/01/2013 "last drug use was 10 yr ago"     Allergies   Patient has no known allergies.   Review of Systems Review of Systems  Reason unable to perform ROS: See HPI as above.     Physical Exam Triage Vital Signs ED Triage Vitals [10/30/17 1032]  Enc Vitals Group     BP (!) 193/115     Pulse Rate 90     Resp (!) 22     Temp      Temp src      SpO2 94 %     Weight 200 lb (90.7 kg)     Height      Head Circumference      Peak Flow      Pain Score 0     Pain Loc      Pain Edu?      Excl. in GC?    No data found.  Updated Vital Signs BP (!) 170/115   Pulse 85   Resp 20   Wt 200 lb (90.7 kg)   SpO2 93%   BMI 29.53 kg/m   Physical Exam  Constitutional: He is oriented to person, place, and time. He appears well-developed and well-nourished. No distress.  HENT:  Head: Normocephalic and atraumatic.  Right Ear: Tympanic membrane, external ear and ear canal normal. Tympanic membrane is not erythematous and not bulging.  Left Ear: Tympanic membrane, external ear and ear canal normal. Tympanic membrane is not erythematous and not bulging.  Nose: Nose normal. Right sinus exhibits no maxillary sinus tenderness and no frontal sinus tenderness. Left sinus exhibits no maxillary sinus tenderness and no frontal sinus tenderness.  Mouth/Throat: Uvula is midline, oropharynx is clear and moist and mucous membranes are normal.  Eyes: Conjunctivae are normal. Pupils are equal, round, and reactive to light.  Neck: Normal range of motion. Neck supple.  Cardiovascular: Normal  rate, regular rhythm and normal heart sounds. Exam reveals no gallop and no friction rub.  No murmur heard. Pulmonary/Chest: Effort normal.  Coarse breath sounds with better air movement after 2 DuoNeb treatment.  Mild diffuse wheezing.  Lymphadenopathy:    He has no cervical adenopathy.  Neurological: He is alert and oriented to person, place, and time.  Skin: Skin is warm and dry.  Psychiatric: He has a normal mood and affect. His behavior is normal. Judgment normal.     UC Treatments / Results  Labs (all labs ordered are listed, but only abnormal results are displayed) Labs Reviewed - No data to display  EKG  EKG Interpretation None       Radiology No results found.  Procedures Procedures (including critical care time)  Medications Ordered in UC Medications  ipratropium-albuterol (DUONEB) 0.5-2.5 (3) MG/3ML nebulizer solution 3 mL (3 mLs Nebulization Given 10/30/17 1028)  ipratropium-albuterol (DUONEB) 0.5-2.5 (3) MG/3ML nebulizer solution 3 mL (3 mLs Nebulization Given 10/30/17 1044)  methylPREDNISolone sodium succinate (SOLU-MEDROL) 125 mg/2 mL injection 125 mg (125 mg Intramuscular Given 10/30/17 1123)     Initial Impression / Assessment and Plan / UC Course  I have reviewed the triage vital signs and the nursing notes.  Pertinent labs & imaging results that were available during my care of the patient were reviewed by me and considered in my medical decision making (see chart for details).  Clinical Course as of Oct 30 1124  Tue Oct 30, 2017  1040 Patient audibly wheezing during triage, and DuoNeb was started prior to vitals. Continued diffuse wheezing on lung exam, patient  with slight relief after first DuoNeb treatment.  Will start second DuoNeb treatment and reassess.  [AY]    Clinical Course User Index [AY] Belinda Fisher, PA-C   We will treat for COPD exacerbation.  Solu-Medrol injection in office today.  Azithromycin and prednisone as directed.  Refilled albuterol  nebulizer solution.  Refilled losartan for hypertension, patient to follow-up with PCP as scheduled next week for reevaluation.  Return precautions given.  Patient expresses understanding and agrees to plan.  Final Clinical Impressions(s) / UC Diagnoses   Final diagnoses:  COPD exacerbation [facility])    ED Discharge Orders        Ordered    ipratropium-albuterol (DUONEB) 0.5-2.5 (3) MG/3ML SOLN  Every 4 hours PRN     10/30/17 1111    azithromycin (ZITHROMAX) 250 MG tablet  Daily     10/30/17 1111    predniSONE (DELTASONE) 50 MG tablet  Daily     10/30/17 1111    losartan (COZAAR) 50 MG tablet  Daily     10/30/17 1111        Belinda Fisher, PA-C 10/30/17 1126

## 2017-10-30 NOTE — ED Triage Notes (Signed)
PT reports asthma flare up that started last night. PT is in between doctors and ran out of home nebulizer solution. PT has an upcoming new PT appt. PT audibly wheezing and 94% on room air.

## 2017-11-05 ENCOUNTER — Other Ambulatory Visit (INDEPENDENT_AMBULATORY_CARE_PROVIDER_SITE_OTHER): Payer: Self-pay | Admitting: Physician Assistant

## 2017-11-05 ENCOUNTER — Encounter (INDEPENDENT_AMBULATORY_CARE_PROVIDER_SITE_OTHER): Payer: Self-pay | Admitting: Physician Assistant

## 2017-11-05 ENCOUNTER — Other Ambulatory Visit: Payer: Self-pay

## 2017-11-05 ENCOUNTER — Ambulatory Visit (INDEPENDENT_AMBULATORY_CARE_PROVIDER_SITE_OTHER): Payer: Medicare Other | Admitting: Physician Assistant

## 2017-11-05 VITALS — BP 160/91 | HR 83 | Temp 98.4°F | Ht 67.5 in | Wt 207.0 lb

## 2017-11-05 DIAGNOSIS — Z23 Encounter for immunization: Secondary | ICD-10-CM | POA: Diagnosis not present

## 2017-11-05 DIAGNOSIS — R739 Hyperglycemia, unspecified: Secondary | ICD-10-CM

## 2017-11-05 DIAGNOSIS — Z1159 Encounter for screening for other viral diseases: Secondary | ICD-10-CM

## 2017-11-05 DIAGNOSIS — E119 Type 2 diabetes mellitus without complications: Secondary | ICD-10-CM

## 2017-11-05 DIAGNOSIS — I10 Essential (primary) hypertension: Secondary | ICD-10-CM | POA: Diagnosis not present

## 2017-11-05 DIAGNOSIS — J4540 Moderate persistent asthma, uncomplicated: Secondary | ICD-10-CM | POA: Diagnosis not present

## 2017-11-05 LAB — POCT GLYCOSYLATED HEMOGLOBIN (HGB A1C): HEMOGLOBIN A1C: 6.3

## 2017-11-05 MED ORDER — PNEUMOCOCCAL VAC POLYVALENT 25 MCG/0.5ML IJ INJ: 0.5000 mL | INJECTION | INTRAMUSCULAR | Status: DC

## 2017-11-05 MED ORDER — ATENOLOL 50 MG PO TABS: 50.0000 mg | ORAL_TABLET | Freq: Every day | ORAL | 3 refills | Status: DC

## 2017-11-05 MED ORDER — LOSARTAN POTASSIUM 50 MG PO TABS: 50.0000 mg | ORAL_TABLET | Freq: Every day | ORAL | 3 refills | Status: DC

## 2017-11-05 MED ORDER — CLONIDINE HCL 0.1 MG PO TABS: 0.1000 mg | ORAL_TABLET | Freq: Two times a day (BID) | ORAL | 5 refills | Status: DC

## 2017-11-05 MED ORDER — MONTELUKAST SODIUM 10 MG PO TABS: 10.0000 mg | ORAL_TABLET | Freq: Every day | ORAL | 5 refills | Status: DC

## 2017-11-05 MED ORDER — ALBUTEROL SULFATE HFA 108 (90 BASE) MCG/ACT IN AERS: INHALATION_SPRAY | RESPIRATORY_TRACT | 2 refills | Status: DC

## 2017-11-05 MED ORDER — AMLODIPINE BESYLATE 10 MG PO TABS: 10.0000 mg | ORAL_TABLET | Freq: Every day | ORAL | 3 refills | Status: DC

## 2017-11-05 MED ORDER — FLUTICASONE PROPIONATE HFA 110 MCG/ACT IN AERO: 1.0000 | INHALATION_SPRAY | Freq: Two times a day (BID) | RESPIRATORY_TRACT | 12 refills | Status: DC

## 2017-11-05 MED ORDER — TERAZOSIN HCL 2 MG PO CAPS: 2.0000 mg | ORAL_CAPSULE | Freq: Every day | ORAL | 3 refills | Status: DC

## 2017-11-05 MED ORDER — IPRATROPIUM-ALBUTEROL 0.5-2.5 (3) MG/3ML IN SOLN: 3.0000 mL | RESPIRATORY_TRACT | 0 refills | Status: DC | PRN

## 2017-11-05 NOTE — Progress Notes (Signed)
Subjective:  Patient ID: Evan Brock, male    DOB: 07-04-1951  Age: 67 y.o. MRN: [id number]  CC: establish care  HPI Evan Brock is a 67 y.o. male with a medical history of arthritis, asthma, COPD, DM2, bilateral hip replacement, and hypertension presents as a new patient with a need for anti-hypertensive refills. Says his BP is usually 140s/70s when on anti-hypertensives. Says he is short of breath at times and uses his nebulizer three times per week. Uses his albuterol inhaler on occasion. Has not taken ICS or combo ICS/LABA because his insurance did not cover these medications. Denies CP, palpitations, HA, tingling, numbness, LE edema, PND, orthopnea, claudication, presyncope, syncope, abdominal pain, f/c/n/v, rash, or GI/GU sxs.     Outpatient Medications Prior to Visit  Medication Sig Dispense Refill  . albuterol (PROVENTIL HFA;VENTOLIN HFA) 108 (90 BASE) MCG/ACT inhaler 1-2 puffs q 6 hours x 3 days then prn SOB 1 Inhaler 2  . amLODipine (NORVASC) 10 MG tablet Take 10 mg by mouth daily.    Marland Kitchen ipratropium-albuterol (DUONEB) 0.5-2.5 (3) MG/3ML SOLN Take 3 mLs by nebulization every 4 (four) hours as needed. Shortness of breathe 180 mL 0  . losartan (COZAAR) 50 MG tablet Take 1 tablet (50 mg total) by mouth daily. 30 tablet 0  . montelukast (SINGULAIR) 10 MG tablet TAKE ONE TABLET BY MOUTH AT BEDTIME 30 tablet 5  . atenolol (TENORMIN) 50 MG tablet Take 50 mg by mouth daily.    Marland Kitchen azithromycin (ZITHROMAX) 250 MG tablet Take 1 tablet (250 mg total) by mouth daily. Take first 2 tablets together, then 1 every day until finished. 6 tablet 0  . cloNIDine (CATAPRES) 0.1 MG tablet Take 1 tablet by mouth daily.    . cyclobenzaprine (FLEXERIL) 10 MG tablet Take 10 mg by mouth 3 (three) times daily as needed for muscle spasms.    . Fluticasone Furoate-Vilanterol (BREO ELLIPTA) 200-25 MCG/INH AEPB Inhale 1 puff into the lungs daily. (Patient not taking: Reported on 11/05/2017) 60 each 5  .  guaiFENesin-dextromethorphan (ROBITUSSIN DM) 100-10 MG/5ML syrup Take 5 mLs by mouth every 6 (six) hours as needed for cough (to loosen phlegm). (Patient not taking: Reported on 11/05/2017) 118 mL 0  . terazosin (HYTRIN) 2 MG capsule Take 2 mg by mouth daily.     No facility-administered medications prior to visit.      ROS Review of Systems  Constitutional: Negative for chills, fever and malaise/fatigue.  Eyes: Negative for blurred vision.  Respiratory: Positive for shortness of breath.   Cardiovascular: Negative for chest pain and palpitations.  Gastrointestinal: Negative for abdominal pain and nausea.  Genitourinary: Negative for dysuria and hematuria.  Musculoskeletal: Negative for joint pain and myalgias.  Skin: Negative for rash.  Neurological: Negative for tingling and headaches.  Psychiatric/Behavioral: Negative for depression. The patient is not nervous/anxious.     Objective:  BP (!) 160/91 (BP Location: Right Arm, Patient Position: Sitting, Cuff Size: Large)   Pulse 83   Temp 98.4 F (36.9 C) (Oral)   Ht 5' 7.5" (1.715 m)   Wt 207 lb (93.9 kg)   SpO2 94%   BMI 31.94 kg/m   BP/Weight 11/05/2017 10/30/2017 10/06/2015  Systolic BP 160 170 154  Diastolic BP 91 115 94  Wt. (Lbs) 207 200 -  BMI 31.94 29.53 -      Physical Exam  Constitutional: He is oriented to person, place, and time.  Well developed, well nourished, NAD, polite  HENT:  Head: Normocephalic and  atraumatic.  Eyes: No scleral icterus.  Neck: Normal range of motion. Neck supple. No thyromegaly present.  Cardiovascular: Normal rate, regular rhythm and normal heart sounds.  Pulmonary/Chest: Effort normal and breath sounds normal.  Abdominal: Soft. Bowel sounds are normal. There is no tenderness.  Musculoskeletal: He exhibits no edema.  Neurological: He is alert and oriented to person, place, and time.  Skin: Skin is warm and dry. No rash noted. No erythema. No pallor.  Psychiatric: He has a normal  mood and affect. His behavior is normal. Thought content normal.  Vitals reviewed.    Assessment & Plan:    1. Type 2 diabetes mellitus without complication, without long-term current use of insulin (HCC) - A1c 6.3 % in clinic today down from previous 6.5%. Controlling on avoidance of sweets and occasional exercise. - Comprehensive metabolic panel - TSH - CBC with Differential  2. Hyperglycemia - HgB A1c 6.3% in clinic today.   3. Essential hypertension - Refill amLODipine (NORVASC) 10 MG tablet; Take 1 tablet (10 mg total) by mouth daily.  Dispense: 90 tablet; Refill: 3 - Refill losartan (COZAAR) 50 MG tablet; Take 1 tablet (50 mg total) by mouth daily.  Dispense: 90 tablet; Refill: 3 - Refill terazosin (HYTRIN) 2 MG capsule; Take 1 capsule (2 mg total) by mouth daily.  Dispense: 90 capsule; Refill: 3 - Refill cloNIDine (CATAPRES) 0.1 MG tablet; Take 1 tablet (0.1 mg total) by mouth 2 (two) times daily.  Dispense: 60 tablet; Refill: 5 - Refill atenolol (TENORMIN) 50 MG tablet; Take 1 tablet (50 mg total) by mouth daily.  Dispense: 90 tablet; Refill: 3  4. Need for hepatitis C screening test - Hepatitis C lab  5. Moderate persistent asthma without complication - Refill ipratropium-albuterol (DUONEB) 0.5-2.5 (3) MG/3ML SOLN; Take 3 mLs by nebulization every 4 (four) hours as needed. Shortness of breathe  Dispense: 180 mL; Refill: 0 - Refill montelukast (SINGULAIR) 10 MG tablet; Take 1 tablet (10 mg total) by mouth at bedtime.  Dispense: 30 tablet; Refill: 5 - Begin fluticasone (FLOVENT HFA) 110 MCG/ACT inhaler; Inhale 1 puff into the lungs 2 (two) times daily.  Dispense: 1 Inhaler; Refill: 12 - Refill albuterol (PROVENTIL HFA;VENTOLIN HFA) 108 (90 Base) MCG/ACT inhaler; 1-2 puffs q 6 hours x 3 days then prn SOB  Dispense: 1 Inhaler; Refill: 2  6. Need for prophylactic vaccination against Streptococcus pneumoniae (pneumococcus) - pneumococcal 23 valent vaccine (PNU-IMMUNE) injection  0.5 mL   Meds ordered this encounter  Medications  . pneumococcal 23 valent vaccine (PNU-IMMUNE) injection 0.5 mL  . amLODipine (NORVASC) 10 MG tablet    Sig: Take 1 tablet (10 mg total) by mouth daily.    Dispense:  90 tablet    Refill:  3    Order Specific Question:   Supervising Provider    Answer:   Quentin Angst [id number]  . losartan (COZAAR) 50 MG tablet    Sig: Take 1 tablet (50 mg total) by mouth daily.    Dispense:  90 tablet    Refill:  3    Order Specific Question:   Supervising Provider    Answer:   Quentin Angst [id number]  . terazosin (HYTRIN) 2 MG capsule    Sig: Take 1 capsule (2 mg total) by mouth daily.    Dispense:  90 capsule    Refill:  3    Order Specific Question:   Supervising Provider    Answer:   Quentin Angst [id number]  . cloNIDine (  CATAPRES) 0.1 MG tablet    Sig: Take 1 tablet (0.1 mg total) by mouth 2 (two) times daily.    Dispense:  60 tablet    Refill:  5    Order Specific Question:   Supervising Provider    Answer:   Quentin Angst [id number]  . atenolol (TENORMIN) 50 MG tablet    Sig: Take 1 tablet (50 mg total) by mouth daily.    Dispense:  90 tablet    Refill:  3    Order Specific Question:   Supervising Provider    Answer:   Quentin Angst [id number]  . DISCONTD: albuterol (PROVENTIL HFA;VENTOLIN HFA) 108 (90 Base) MCG/ACT inhaler    Sig: 1-2 puffs q 6 hours x 3 days then prn SOB    Dispense:  1 Inhaler    Refill:  2    Order Specific Question:   Supervising Provider    Answer:   Quentin Angst [id number]  . ipratropium-albuterol (DUONEB) 0.5-2.5 (3) MG/3ML SOLN    Sig: Take 3 mLs by nebulization every 4 (four) hours as needed. Shortness of breathe    Dispense:  180 mL    Refill:  0    Order Specific Question:   Supervising Provider    Answer:   Quentin Angst [id number]  . montelukast (SINGULAIR) 10 MG tablet    Sig: Take 1 tablet (10 mg total) by mouth at bedtime.    Dispense:  30 tablet     Refill:  5    Order Specific Question:   Supervising Provider    Answer:   Quentin Angst [id number]  . fluticasone (FLOVENT HFA) 110 MCG/ACT inhaler    Sig: Inhale 1 puff into the lungs 2 (two) times daily.    Dispense:  1 Inhaler    Refill:  12    Order Specific Question:   Supervising Provider    Answer:   Quentin Angst [id number]  . albuterol (PROVENTIL HFA;VENTOLIN HFA) 108 (90 Base) MCG/ACT inhaler    Sig: 1-2 puffs q 6 hours x 3 days then prn SOB    Dispense:  1 Inhaler    Refill:  2    Order Specific Question:   Supervising Provider    Answer:   Quentin Angst [id number]    Follow-up: Return in about 4 weeks (around 12/03/2017) for HTN.   Loletta Specter PA

## 2017-11-05 NOTE — Patient Instructions (Signed)
Diabetes Mellitus and Nutrition When you have diabetes (diabetes mellitus), it is very important to have healthy eating habits because your blood sugar (glucose) levels are greatly affected by what you eat and drink. Eating healthy foods in the appropriate amounts, at about the same times every day, can help you:  Control your blood glucose.  Lower your risk of heart disease.  Improve your blood pressure.  Reach or maintain a healthy weight.  Every person with diabetes is different, and each person has different needs for a meal plan. Your health care provider may recommend that you work with a diet and nutrition specialist (dietitian) to make a meal plan that is best for you. Your meal plan may vary depending on factors such as:  The calories you need.  The medicines you take.  Your weight.  Your blood glucose, blood pressure, and cholesterol levels.  Your activity level.  Other health conditions you have, such as heart or kidney disease.  How do carbohydrates affect me? Carbohydrates affect your blood glucose level more than any other type of food. Eating carbohydrates naturally increases the amount of glucose in your blood. Carbohydrate counting is a method for keeping track of how many carbohydrates you eat. Counting carbohydrates is important to keep your blood glucose at a healthy level, especially if you use insulin or take certain oral diabetes medicines. It is important to know how many carbohydrates you can safely have in each meal. This is different for every person. Your dietitian can help you calculate how many carbohydrates you should have at each meal and for snack. Foods that contain carbohydrates include:  Bread, cereal, rice, pasta, and crackers.  Potatoes and corn.  Peas, beans, and lentils.  Milk and yogurt.  Fruit and juice.  Desserts, such as cakes, cookies, ice cream, and candy.  How does alcohol affect me? Alcohol can cause a sudden decrease in blood  glucose (hypoglycemia), especially if you use insulin or take certain oral diabetes medicines. Hypoglycemia can be a life-threatening condition. Symptoms of hypoglycemia (sleepiness, dizziness, and confusion) are similar to symptoms of having too much alcohol. If your health care provider says that alcohol is safe for you, follow these guidelines:  Limit alcohol intake to no more than 1 drink per day for nonpregnant women and 2 drinks per day for men. One drink equals 12 oz of beer, 5 oz of wine, or 1 oz of hard liquor.  Do not drink on an empty stomach.  Keep yourself hydrated with water, diet soda, or unsweetened iced tea.  Keep in mind that regular soda, juice, and other mixers may contain a lot of sugar and must be counted as carbohydrates.  What are tips for following this plan? Reading food labels  Start by checking the serving size on the label. The amount of calories, carbohydrates, fats, and other nutrients listed on the label are based on one serving of the food. Many foods contain more than one serving per package.  Check the total grams (g) of carbohydrates in one serving. You can calculate the number of servings of carbohydrates in one serving by dividing the total carbohydrates by 15. For example, if a food has 30 g of total carbohydrates, it would be equal to 2 servings of carbohydrates.  Check the number of grams (g) of saturated and trans fats in one serving. Choose foods that have low or no amount of these fats.  Check the number of milligrams (mg) of sodium in one serving. Most people   should limit total sodium intake to less than 2,300 mg per day.  Always check the nutrition information of foods labeled as "low-fat" or "nonfat". These foods may be higher in added sugar or refined carbohydrates and should be avoided.  Talk to your dietitian to identify your daily goals for nutrients listed on the label. Shopping  Avoid buying canned, premade, or processed foods. These  foods tend to be high in fat, sodium, and added sugar.  Shop around the outside edge of the grocery store. This includes fresh fruits and vegetables, bulk grains, fresh meats, and fresh dairy. Cooking  Use low-heat cooking methods, such as baking, instead of high-heat cooking methods like deep frying.  Cook using healthy oils, such as olive, canola, or sunflower oil.  Avoid cooking with butter, cream, or high-fat meats. Meal planning  Eat meals and snacks regularly, preferably at the same times every day. Avoid going long periods of time without eating.  Eat foods high in fiber, such as fresh fruits, vegetables, beans, and whole grains. Talk to your dietitian about how many servings of carbohydrates you can eat at each meal.  Eat 4-6 ounces of lean protein each day, such as lean meat, chicken, fish, eggs, or tofu. 1 ounce is equal to 1 ounce of meat, chicken, or fish, 1 egg, or 1/4 cup of tofu.  Eat some foods each day that contain healthy fats, such as avocado, nuts, seeds, and fish. Lifestyle   Check your blood glucose regularly.  Exercise at least 30 minutes 5 or more days each week, or as told by your health care provider.  Take medicines as told by your health care provider.  Do not use any products that contain nicotine or tobacco, such as cigarettes and e-cigarettes. If you need help quitting, ask your health care provider.  Work with a counselor or diabetes educator to identify strategies to manage stress and any emotional and social challenges. What are some questions to ask my health care provider?  Do I need to meet with a diabetes educator?  Do I need to meet with a dietitian?  What number can I call if I have questions?  When are the best times to check my blood glucose? Where to find more information:  American Diabetes Association: diabetes.org/food-and-fitness/food  Academy of Nutrition and Dietetics:  www.eatright.org/resources/health/diseases-and-conditions/diabetes  National Institute of Diabetes and Digestive and Kidney Diseases (NIH): www.niddk.nih.gov/health-information/diabetes/overview/diet-eating-physical-activity Summary  A healthy meal plan will help you control your blood glucose and maintain a healthy lifestyle.  Working with a diet and nutrition specialist (dietitian) can help you make a meal plan that is best for you.  Keep in mind that carbohydrates and alcohol have immediate effects on your blood glucose levels. It is important to count carbohydrates and to use alcohol carefully. This information is not intended to replace advice given to you by your health care provider. Make sure you discuss any questions you have with your health care provider. Document Released: 07/06/2005 Document Revised: 11/13/2016 Document Reviewed: 11/13/2016 Elsevier Interactive Patient Education  2018 Elsevier Inc.  

## 2017-11-06 ENCOUNTER — Telehealth (INDEPENDENT_AMBULATORY_CARE_PROVIDER_SITE_OTHER): Payer: Self-pay

## 2017-11-06 LAB — CBC WITH DIFFERENTIAL/PLATELET
BASOS ABS: 0 10*3/uL (ref 0.0–0.2)
Basos: 0 %
EOS (ABSOLUTE): 0.3 10*3/uL (ref 0.0–0.4)
Eos: 3 %
Hematocrit: 45.7 % (ref 37.5–51.0)
Hemoglobin: 15.4 g/dL (ref 13.0–17.7)
IMMATURE GRANS (ABS): 0 10*3/uL (ref 0.0–0.1)
IMMATURE GRANULOCYTES: 0 %
LYMPHS: 17 %
Lymphocytes Absolute: 1.8 10*3/uL (ref 0.7–3.1)
MCH: 26.9 pg (ref 26.6–33.0)
MCHC: 33.7 g/dL (ref 31.5–35.7)
MCV: 80 fL (ref 79–97)
Monocytes Absolute: 1 10*3/uL — ABNORMAL HIGH (ref 0.1–0.9)
Monocytes: 9 %
NEUTROS PCT: 71 %
Neutrophils Absolute: 7.5 10*3/uL — ABNORMAL HIGH (ref 1.4–7.0)
PLATELETS: 303 10*3/uL (ref 150–379)
RBC: 5.73 x10E6/uL (ref 4.14–5.80)
RDW: 16 % — ABNORMAL HIGH (ref 12.3–15.4)
WBC: 10.7 10*3/uL (ref 3.4–10.8)

## 2017-11-06 LAB — COMPREHENSIVE METABOLIC PANEL
A/G RATIO: 1.4 (ref 1.2–2.2)
ALK PHOS: 65 IU/L (ref 39–117)
ALT: 37 IU/L (ref 0–44)
AST: 28 IU/L (ref 0–40)
Albumin: 4.3 g/dL (ref 3.6–4.8)
BILIRUBIN TOTAL: 0.3 mg/dL (ref 0.0–1.2)
BUN/Creatinine Ratio: 12 (ref 10–24)
BUN: 15 mg/dL (ref 8–27)
CHLORIDE: 98 mmol/L (ref 96–106)
CO2: 26 mmol/L (ref 20–29)
Calcium: 9.5 mg/dL (ref 8.6–10.2)
Creatinine, Ser: 1.22 mg/dL (ref 0.76–1.27)
GFR calc Af Amer: 70 mL/min/{1.73_m2} (ref >59)
GFR calc non Af Amer: 61 mL/min/{1.73_m2} (ref >59)
GLUCOSE: 106 mg/dL — AB (ref 65–99)
Globulin, Total: 3.1 g/dL (ref 1.5–4.5)
POTASSIUM: 4.7 mmol/L (ref 3.5–5.2)
Sodium: 140 mmol/L (ref 134–144)
TOTAL PROTEIN: 7.4 g/dL (ref 6.0–8.5)

## 2017-11-06 LAB — TSH: TSH: 2.1 u[IU]/mL (ref 0.450–4.500)

## 2017-11-06 NOTE — Telephone Encounter (Signed)
-----   Message from Loletta Specter, PA-C sent at 11/06/2017 12:59 PM EST ----- Results are normal. Sugar mildly elevated but he is a known diabetic.

## 2017-11-06 NOTE — Telephone Encounter (Signed)
FWD to PCP. Evan Brock Cadynce Garrette, CMA  

## 2017-11-06 NOTE — Telephone Encounter (Signed)
Patients wife aware of lab results and will inform patient. Maryjean Morn, CMA

## 2017-12-03 ENCOUNTER — Ambulatory Visit (INDEPENDENT_AMBULATORY_CARE_PROVIDER_SITE_OTHER): Payer: Medicare Other | Admitting: Physician Assistant

## 2017-12-03 ENCOUNTER — Encounter (INDEPENDENT_AMBULATORY_CARE_PROVIDER_SITE_OTHER): Payer: Self-pay | Admitting: Physician Assistant

## 2017-12-03 VITALS — BP 117/78 | HR 53 | Temp 97.8°F | Resp 18 | Ht 69.0 in | Wt 206.0 lb

## 2017-12-03 DIAGNOSIS — E119 Type 2 diabetes mellitus without complications: Secondary | ICD-10-CM | POA: Diagnosis not present

## 2017-12-03 DIAGNOSIS — I10 Essential (primary) hypertension: Secondary | ICD-10-CM

## 2017-12-03 DIAGNOSIS — M62838 Other muscle spasm: Secondary | ICD-10-CM

## 2017-12-03 DIAGNOSIS — Z23 Encounter for immunization: Secondary | ICD-10-CM | POA: Diagnosis not present

## 2017-12-03 DIAGNOSIS — Z1211 Encounter for screening for malignant neoplasm of colon: Secondary | ICD-10-CM

## 2017-12-03 MED ORDER — CYCLOBENZAPRINE HCL 10 MG PO TABS: 10.0000 mg | ORAL_TABLET | Freq: Every day | ORAL | 0 refills | Status: DC

## 2017-12-03 NOTE — Progress Notes (Signed)
Subjective:  Patient ID: Evan Brock, male    DOB: 1951-08-15  Age: 67 y.o. MRN: [id number]  CC: HTN    HPI Evan Brock is a 67 y.o. male with a medical history of arthritis, asthma, COPD, DM2, bilateral hip replacement, and hypertension presents on f/u for HTN. Last BP 160/7mHg. Had run out of anti-hypertensives and had them all refilled. Reports BP in the 110s/70s at home. Feels well except for an occasional pain on the left side of neck that is aggravated with left rotation and left lateral flexion. Used heat pads with some relief. Does not endorse any other symptoms or complaints.    Outpatient Medications Prior to Visit  Medication Sig Dispense Refill  . albuterol (PROVENTIL HFA;VENTOLIN HFA) 108 (90 Base) MCG/ACT inhaler 1-2 puffs q 6 hours x 3 days then prn SOB 1 Inhaler 2  . amLODipine (NORVASC) 10 MG tablet Take 1 tablet (10 mg total) by mouth daily. 90 tablet 3  . atenolol (TENORMIN) 50 MG tablet Take 1 tablet (50 mg total) by mouth daily. 90 tablet 3  . cloNIDine (CATAPRES) 0.1 MG tablet Take 1 tablet (0.1 mg total) by mouth 2 (two) times daily. 60 tablet 5  . fluticasone (FLOVENT HFA) 110 MCG/ACT inhaler Inhale 1 puff into the lungs 2 (two) times daily. 1 Inhaler 12  . ipratropium-albuterol (DUONEB) 0.5-2.5 (3) MG/3ML SOLN Take 3 mLs by nebulization every 4 (four) hours as needed. Shortness of breathe 180 mL 0  . losartan (COZAAR) 50 MG tablet Take 1 tablet (50 mg total) by mouth daily. 90 tablet 3  . montelukast (SINGULAIR) 10 MG tablet Take 1 tablet (10 mg total) by mouth at bedtime. 30 tablet 5  . terazosin (HYTRIN) 2 MG capsule Take 1 capsule (2 mg total) by mouth daily. 90 capsule 3   No facility-administered medications prior to visit.      ROS Review of Systems  Constitutional: Negative for chills, fever and malaise/fatigue.  Eyes: Negative for blurred vision.  Respiratory: Negative for shortness of breath.   Cardiovascular: Negative for chest pain and  palpitations.  Gastrointestinal: Negative for abdominal pain and nausea.  Genitourinary: Negative for dysuria and hematuria.  Musculoskeletal: Positive for neck pain. Negative for joint pain and myalgias.  Skin: Negative for rash.  Neurological: Negative for tingling and headaches.  Psychiatric/Behavioral: Negative for depression. The patient is not nervous/anxious.     Objective:  BP 117/78 (BP Location: Left Arm, Patient Position: Sitting, Cuff Size: Normal)   Pulse (!) 53   Temp 97.8 F (36.6 C) (Oral)   Resp 18   Ht 5' 9"  (1.753 m)   Wt 206 lb (93.4 kg)   SpO2 96%   BMI 30.42 kg/m   BP/Weight 12/03/2017 12/11/941714/0/8144 Systolic BP [id number] Diastolic BP 78 91 1702 Wt. (Lbs) 206 207 200  BMI 30.42 31.94 29.53      Physical Exam  Constitutional: He is oriented to person, place, and time.  Well developed, well nourished, NAD, polite  HENT:  Head: Normocephalic and atraumatic.  Eyes: No scleral icterus.  Neck: Normal range of motion. Neck supple. No thyromegaly present.  Cardiovascular: Normal rate, regular rhythm and normal heart sounds.  Pulmonary/Chest: Effort normal and breath sounds normal.  Abdominal: Soft. Bowel sounds are normal. There is no tenderness.  Musculoskeletal: He exhibits no edema.  Muscle spasm of the left upper trapezius  Neurological: He is alert and oriented to person, place, and time. No cranial nerve  deficit. Coordination normal.  Skin: Skin is warm and dry. No rash noted. No erythema. No pallor.  Psychiatric: He has a normal mood and affect. His behavior is normal. Thought content normal.  Vitals reviewed.    Assessment & Plan:    1. Type 2 diabetes mellitus without complication, without long-term current use of insulin ([place]) - Foot exam conducted today. - Next eye exam in 6 months  2. Hypertension, unspecified type - Continue on all anti-hypertensives as directed  3. Muscle spasm - cyclobenzaprine (FLEXERIL) 10 MG tablet;  Take 1 tablet (10 mg total) by mouth at bedtime.  Dispense: 10 tablet; Refill: 0  4. Need for Tdap vaccination - Tdap vaccine greater than or equal to 7yo IM  5. Special screening for malignant neoplasms, colon - FIT testing kit explained and given to patient for him to return at a later date    Meds ordered this encounter  Medications  . cyclobenzaprine (FLEXERIL) 10 MG tablet    Sig: Take 1 tablet (10 mg total) by mouth at bedtime.    Dispense:  10 tablet    Refill:  0    Order Specific Question:   Supervising Provider    Answer:   Tresa Garter [id number]    Follow-up: Return in about 6 months (around 06/02/2018) for DM.   Clent Demark PA

## 2017-12-03 NOTE — Patient Instructions (Signed)
Muscle Cramps and Spasms Muscle cramps and spasms occur when a muscle or muscles tighten and you have no control over this tightening (involuntary muscle contraction). They are a common problem and can develop in any muscle. The most common place is in the calf muscles of the leg. Muscle cramps and muscle spasms are both involuntary muscle contractions, but there are some differences between the two:  Muscle cramps are painful. They come and go and may last a few seconds to 15 minutes. Muscle cramps are often more forceful and last longer than muscle spasms.  Muscle spasms may or may not be painful. They may also last just a few seconds or much longer.  Certain medical conditions, such as diabetes or Parkinson disease, can make it more likely to develop cramps or spasms. However, cramps or spasms are usually not caused by a serious underlying problem. Common causes include:  Overexertion.  Overuse from repetitive motions, or doing the same thing over and over.  Remaining in a certain position for a long period of time.  Improper preparation, form, or technique while playing a sport or doing an activity.  Dehydration.  Injury.  Side effects of some medicines.  Abnormally low levels of the salts and ions in your blood (electrolytes), especially potassium and calcium. This could happen if you are taking water pills (diuretics) or if you are pregnant.  In many cases, the cause of muscle cramps or spasms is unknown. Follow these instructions at home:  Stay well hydrated. Drink enough fluid to keep your urine clear or pale yellow.  Try massaging, stretching, and relaxing the affected muscle.  If directed, apply heat to tight or tense muscles as often as told by your health care provider. Use the heat source that your health care provider recommends, such as a moist heat pack or a heating pad. ? Place a towel between your skin and the heat source. ? Leave the heat on for 20-30  minutes. ? Remove the heat if your skin turns bright red. This is especially important if you are unable to feel pain, heat, or cold. You may have a greater risk of getting burned.  If directed, put ice on the affected area. This may help if you are sore or have pain after a cramp or spasm. ? Put ice in a plastic bag. ? Place a towel between your skin and the bag. ? Leavethe ice on for 20 minutes, 2-3 times a day.  Take over-the-counter and prescription medicines only as told by your health care provider.  Pay attention to any changes in your symptoms. Contact a health care provider if:  Your cramps or spasms get more severe or happen more often.  Your cramps or spasms do not improve over time. This information is not intended to replace advice given to you by your health care provider. Make sure you discuss any questions you have with your health care provider. Document Released: 03/31/2002 Document Revised: 11/10/2015 Document Reviewed: 07/13/2015 Elsevier Interactive Patient Education  2018 [employer] Inc.  

## 2018-01-17 ENCOUNTER — Ambulatory Visit (HOSPITAL_COMMUNITY)
Admission: RE | Admit: 2018-01-17 | Discharge: 2018-01-17 | Disposition: A | Discharge status: Home / Self Care | Payer: Medicare Other | Source: Ambulatory Visit | Attending: Physician Assistant | Admitting: Physician Assistant

## 2018-01-17 ENCOUNTER — Ambulatory Visit (INDEPENDENT_AMBULATORY_CARE_PROVIDER_SITE_OTHER): Payer: Medicare Other | Admitting: Physician Assistant

## 2018-01-17 ENCOUNTER — Encounter (INDEPENDENT_AMBULATORY_CARE_PROVIDER_SITE_OTHER): Payer: Self-pay | Admitting: Physician Assistant

## 2018-01-17 ENCOUNTER — Other Ambulatory Visit (INDEPENDENT_AMBULATORY_CARE_PROVIDER_SITE_OTHER): Payer: Self-pay | Admitting: Physician Assistant

## 2018-01-17 ENCOUNTER — Other Ambulatory Visit: Payer: Self-pay

## 2018-01-17 VITALS — BP 126/85 | HR 69 | Temp 98.2°F | Wt 204.6 lb

## 2018-01-17 DIAGNOSIS — M47816 Spondylosis without myelopathy or radiculopathy, lumbar region: Secondary | ICD-10-CM | POA: Insufficient documentation

## 2018-01-17 DIAGNOSIS — Z96643 Presence of artificial hip joint, bilateral: Secondary | ICD-10-CM | POA: Insufficient documentation

## 2018-01-17 DIAGNOSIS — I708 Atherosclerosis of other arteries: Secondary | ICD-10-CM | POA: Insufficient documentation

## 2018-01-17 DIAGNOSIS — R109 Unspecified abdominal pain: Secondary | ICD-10-CM

## 2018-01-17 DIAGNOSIS — R05 Cough: Secondary | ICD-10-CM | POA: Diagnosis not present

## 2018-01-17 DIAGNOSIS — R059 Cough, unspecified: Secondary | ICD-10-CM

## 2018-01-17 DIAGNOSIS — G8929 Other chronic pain: Secondary | ICD-10-CM | POA: Diagnosis not present

## 2018-01-17 DIAGNOSIS — M545 Low back pain, unspecified: Secondary | ICD-10-CM

## 2018-01-17 MED ORDER — GUAIFENESIN ER 1200 MG PO TB12: 1.0000 | ORAL_TABLET | Freq: Two times a day (BID) | ORAL | 0 refills | Status: AC

## 2018-01-17 MED ORDER — AMOXICILLIN-POT CLAVULANATE 875-125 MG PO TABS: 1.0000 | ORAL_TABLET | Freq: Two times a day (BID) | ORAL | 0 refills | Status: DC

## 2018-01-17 MED ORDER — NAPROXEN 500 MG PO TABS: 500.0000 mg | ORAL_TABLET | Freq: Two times a day (BID) | ORAL | 0 refills | Status: DC

## 2018-01-17 MED ORDER — DICYCLOMINE HCL 10 MG PO CAPS: 10.0000 mg | ORAL_CAPSULE | Freq: Three times a day (TID) | ORAL | 0 refills | Status: DC

## 2018-01-17 MED ORDER — CYCLOBENZAPRINE HCL 10 MG PO TABS: 10.0000 mg | ORAL_TABLET | Freq: Three times a day (TID) | ORAL | 0 refills | Status: DC | PRN

## 2018-01-17 NOTE — Patient Instructions (Signed)

## 2018-01-17 NOTE — Progress Notes (Signed)
Subjective:  Patient ID: Evan Brock, male    DOB: 1950/12/17  Age: 67 y.o. MRN: [id number]  CC: chest congestion   HPI Evan Brock is a 67 y.o. male with a medical history of arthritis, asthma, COPD, DM2, bilateral hip replacement, and hypertension presents with cough, chest congestion, and lower back pain since 3 days ago. Concerned about his back pain which has been chronic since 2011. Feels the acute back pain is from the force of coughing. There is increased tightness in the lower back and is difficult for him to move in certain directions. Complains of abdominal cramps but the cramps are waxing and waning over many years. Does not endorse CP, palpitations, HA, tingling, numbness, LE edema, PND, orthopnea, claudication, presyncope, syncope, f/c/n/v, rash, or GI/GU sxs.    Outpatient Medications Prior to Visit  Medication Sig Dispense Refill  . albuterol (PROVENTIL HFA;VENTOLIN HFA) 108 (90 Base) MCG/ACT inhaler 1-2 puffs q 6 hours x 3 days then prn SOB 1 Inhaler 2  . amLODipine (NORVASC) 10 MG tablet Take 1 tablet (10 mg total) by mouth daily. 90 tablet 3  . atenolol (TENORMIN) 50 MG tablet Take 1 tablet (50 mg total) by mouth daily. 90 tablet 3  . cloNIDine (CATAPRES) 0.1 MG tablet Take 1 tablet (0.1 mg total) by mouth 2 (two) times daily. 60 tablet 5  . cyclobenzaprine (FLEXERIL) 10 MG tablet Take 1 tablet (10 mg total) by mouth at bedtime. 10 tablet 0  . fluticasone (FLOVENT HFA) 110 MCG/ACT inhaler Inhale 1 puff into the lungs 2 (two) times daily. 1 Inhaler 12  . ipratropium-albuterol (DUONEB) 0.5-2.5 (3) MG/3ML SOLN Take 3 mLs by nebulization every 4 (four) hours as needed. Shortness of breathe 180 mL 0  . losartan (COZAAR) 50 MG tablet Take 1 tablet (50 mg total) by mouth daily. 90 tablet 3  . montelukast (SINGULAIR) 10 MG tablet Take 1 tablet (10 mg total) by mouth at bedtime. 30 tablet 5  . terazosin (HYTRIN) 2 MG capsule Take 1 capsule (2 mg total) by mouth daily. 90 capsule 3    No facility-administered medications prior to visit.      ROS Review of Systems  Constitutional: Negative for chills, fever and malaise/fatigue.  Eyes: Negative for blurred vision.  Respiratory: Positive for cough. Negative for shortness of breath.   Cardiovascular: Negative for chest pain and palpitations.  Gastrointestinal: Positive for abdominal pain. Negative for nausea.  Genitourinary: Negative for dysuria and hematuria.  Musculoskeletal: Positive for back pain. Negative for joint pain and myalgias.  Skin: Negative for rash.  Neurological: Negative for tingling and headaches.  Psychiatric/Behavioral: Negative for depression. The patient is not nervous/anxious.     Objective:  There were no vitals taken for this visit.  BP/Weight 12/03/2017 2/87/6811 02/27/2619  Systolic BP 355 974 163  Diastolic BP 78 91 845  Wt. (Lbs) 206 207 200  BMI 30.42 31.94 29.53      Physical Exam  Constitutional: He is oriented to person, place, and time.  Well developed, well nourished, NAD, polite  HENT:  Head: Normocephalic and atraumatic.  Eyes: No scleral icterus.  Neck: Normal range of motion. Neck supple. No thyromegaly present.  Cardiovascular: Normal rate, regular rhythm and normal heart sounds.  Pulmonary/Chest: Effort normal. No respiratory distress. He has no wheezes. He has rales.  Abdominal: Soft. Bowel sounds are normal. There is tenderness (generalized TTP in all quadrants).  Musculoskeletal: He exhibits no edema.  Muscle spasm in the paraspinals of the lower  back with TTP.   Neurological: He is alert and oriented to person, place, and time. No cranial nerve deficit. Coordination normal.  Skin: Skin is warm and dry. No rash noted. No erythema. No pallor.  Psychiatric: He has a normal mood and affect. His behavior is normal. Thought content normal.  Vitals reviewed.    Assessment & Plan:    1. Cough - DG Chest 2 View; Future - amoxicillin-clavulanate (AUGMENTIN)  875-125 MG tablet; Take 1 tablet by mouth 2 (two) times daily.  Dispense: 20 tablet; Refill: 0 - naproxen (NAPROSYN) 500 MG tablet; Take 1 tablet (500 mg total) by mouth 2 (two) times daily with a meal.  Dispense: 30 tablet; Refill: 0 - Guaifenesin (MUCINEX MAXIMUM STRENGTH) 1200 MG TB12; Take 1 tablet (1,200 mg total) by mouth 2 (two) times daily for 5 days.  Dispense: 10 tablet; Refill: 0  2. Acute bilateral low back pain without sciatica - cyclobenzaprine (FLEXERIL) 10 MG tablet; Take 1 tablet (10 mg total) by mouth 3 (three) times daily as needed for muscle spasms.  Dispense: 30 tablet; Refill: 0 - DG Lumbar Spine Complete; Future  3. Chronic bilateral low back pain without sciatica - cyclobenzaprine (FLEXERIL) 10 MG tablet; Take 1 tablet (10 mg total) by mouth 3 (three) times daily as needed for muscle spasms.  Dispense: 30 tablet; Refill: 0 - DG Lumbar Spine Complete; Future  4. Abdominal cramps - dicyclomine (BENTYL) 10 MG capsule; Take 1 capsule (10 mg total) by mouth 4 (four) times daily -  before meals and at bedtime.  Dispense: 30 capsule; Refill: 0 *Gave pt another FIT kit. He handed one in previously but there is no result. There may have been a specimen processing error.     Meds ordered this encounter  Medications  . amoxicillin-clavulanate (AUGMENTIN) 875-125 MG tablet    Sig: Take 1 tablet by mouth 2 (two) times daily.    Dispense:  20 tablet    Refill:  0    Order Specific Question:   Supervising Provider    Answer:   Tresa Garter [id number]  . naproxen (NAPROSYN) 500 MG tablet    Sig: Take 1 tablet (500 mg total) by mouth 2 (two) times daily with a meal.    Dispense:  30 tablet    Refill:  0    Order Specific Question:   Supervising Provider    Answer:   Tresa Garter [id number]  . cyclobenzaprine (FLEXERIL) 10 MG tablet    Sig: Take 1 tablet (10 mg total) by mouth 3 (three) times daily as needed for muscle spasms.    Dispense:  30 tablet    Refill:   0    Order Specific Question:   Supervising Provider    Answer:   Tresa Garter [id number]  . dicyclomine (BENTYL) 10 MG capsule    Sig: Take 1 capsule (10 mg total) by mouth 4 (four) times daily -  before meals and at bedtime.    Dispense:  30 capsule    Refill:  0    Order Specific Question:   Supervising Provider    Answer:   Tresa Garter [id number]  . Guaifenesin (MUCINEX MAXIMUM STRENGTH) 1200 MG TB12    Sig: Take 1 tablet (1,200 mg total) by mouth 2 (two) times daily for 5 days.    Dispense:  10 tablet    Refill:  0    Order Specific Question:   Supervising Provider    Answer:  Tresa Garter [[id number]]    Follow-up: Return in about 1 month (around 02/14/2018) for abdominal cramps.   Clent Demark PA

## 2018-01-18 ENCOUNTER — Other Ambulatory Visit (INDEPENDENT_AMBULATORY_CARE_PROVIDER_SITE_OTHER): Payer: Self-pay | Admitting: Physician Assistant

## 2018-01-18 ENCOUNTER — Telehealth (INDEPENDENT_AMBULATORY_CARE_PROVIDER_SITE_OTHER): Payer: Self-pay

## 2018-01-18 DIAGNOSIS — I70209 Unspecified atherosclerosis of native arteries of extremities, unspecified extremity: Secondary | ICD-10-CM

## 2018-01-18 NOTE — Telephone Encounter (Signed)
-----   Message from Loletta Specter, PA-C sent at 01/18/2018  1:04 PM EDT ----- CXR shows no acute disease. XR lumbar spine shows stable disease. However, calcifications were seen in the aortoiliac arteries. I will send to vascular specialists for further evaluation.

## 2018-01-18 NOTE — Telephone Encounter (Signed)
-----   Message from Roger David Gomez, PA-C sent at 01/18/2018  1:04 PM EDT ----- CXR shows no acute disease. XR lumbar spine shows stable disease. However, calcifications were seen in the aortoiliac arteries. I will send to vascular specialists for further evaluation. 

## 2018-01-18 NOTE — Telephone Encounter (Signed)
Called patients phone but voicemail box is full. Will call again before mailing results. Maryjean Morn, CMA

## 2018-01-18 NOTE — Telephone Encounter (Signed)
Patient is aware that chest xray shows no acute disease, lumbar xray shows stable disease. Calcifications in the aortoiliac arteries, referral placed for vascular specialists for further evaluation. Will be contacted by vascular office to schedule appointment. Maryjean Morn, CMA

## 2018-02-12 ENCOUNTER — Other Ambulatory Visit (INDEPENDENT_AMBULATORY_CARE_PROVIDER_SITE_OTHER): Payer: Self-pay | Admitting: Physician Assistant

## 2018-02-12 ENCOUNTER — Telehealth (INDEPENDENT_AMBULATORY_CARE_PROVIDER_SITE_OTHER): Payer: Self-pay

## 2018-02-12 DIAGNOSIS — M545 Low back pain, unspecified: Secondary | ICD-10-CM

## 2018-02-12 DIAGNOSIS — G8929 Other chronic pain: Secondary | ICD-10-CM

## 2018-02-12 MED ORDER — CYCLOBENZAPRINE HCL 10 MG PO TABS: 10.0000 mg | ORAL_TABLET | Freq: Every day | ORAL | 0 refills | Status: DC

## 2018-02-12 NOTE — Telephone Encounter (Signed)
Ok, I have sent a short course to [facility].

## 2018-02-12 NOTE — Telephone Encounter (Signed)
Patient is requesting a refill or new Rx for Cyclobenzaprine sent to [facility] on [address]. Maryjean Morn, CMA

## 2018-02-18 ENCOUNTER — Ambulatory Visit (INDEPENDENT_AMBULATORY_CARE_PROVIDER_SITE_OTHER): Payer: Medicare Other | Admitting: Physician Assistant

## 2018-02-22 ENCOUNTER — Other Ambulatory Visit: Payer: Self-pay

## 2018-02-22 ENCOUNTER — Ambulatory Visit (INDEPENDENT_AMBULATORY_CARE_PROVIDER_SITE_OTHER): Payer: Medicare Other | Admitting: Physician Assistant

## 2018-02-22 ENCOUNTER — Encounter (INDEPENDENT_AMBULATORY_CARE_PROVIDER_SITE_OTHER): Payer: Self-pay | Admitting: Physician Assistant

## 2018-02-22 ENCOUNTER — Other Ambulatory Visit (INDEPENDENT_AMBULATORY_CARE_PROVIDER_SITE_OTHER): Payer: Self-pay | Admitting: Physician Assistant

## 2018-02-22 VITALS — BP 110/76 | HR 66 | Temp 98.0°F | Wt 205.2 lb

## 2018-02-22 DIAGNOSIS — Z760 Encounter for issue of repeat prescription: Secondary | ICD-10-CM | POA: Diagnosis not present

## 2018-02-22 DIAGNOSIS — J4540 Moderate persistent asthma, uncomplicated: Secondary | ICD-10-CM

## 2018-02-22 DIAGNOSIS — Z1211 Encounter for screening for malignant neoplasm of colon: Secondary | ICD-10-CM | POA: Diagnosis not present

## 2018-02-22 MED ORDER — ALBUTEROL SULFATE HFA 108 (90 BASE) MCG/ACT IN AERS: INHALATION_SPRAY | RESPIRATORY_TRACT | 2 refills | Status: DC

## 2018-02-22 MED ORDER — CLONIDINE HCL 0.1 MG PO TABS: 0.1000 mg | ORAL_TABLET | Freq: Two times a day (BID) | ORAL | 5 refills | Status: DC

## 2018-02-22 MED ORDER — MONTELUKAST SODIUM 10 MG PO TABS: 10.0000 mg | ORAL_TABLET | Freq: Every day | ORAL | 5 refills | Status: DC

## 2018-02-22 MED ORDER — IPRATROPIUM-ALBUTEROL 0.5-2.5 (3) MG/3ML IN SOLN: 3.0000 mL | Freq: Four times a day (QID) | RESPIRATORY_TRACT | 1 refills | Status: DC | PRN

## 2018-02-22 NOTE — Addendum Note (Signed)
Addended by: Maryjean Morn on: 02/22/2018 03:18 PM   Modules accepted: Kipp Brood

## 2018-02-22 NOTE — Patient Instructions (Signed)

## 2018-02-22 NOTE — Progress Notes (Signed)
Subjective:  Patient ID: Evan Brock, male    DOB: 1950/11/21  Age: 67 y.o. MRN: [id number]  CC: f/u abdominal cramps  HPI Evan Brock a 67 y.o.malewith a medical history of arthritis, asthma, COPD, DM2, bilateral hip replacement, and hypertension presents on f/u of abdominal cramps. Has prescribed Bentyl 10 mg which has resolved his cramping. Has only had one episode of abdominal pain since finishing his Bentyl as opposed to daily cramping. Brings ot turned in his Cologuard yet    Requests a refill of his Duoneb. Says his COPD is worse during the summer. Denies "asthma attacks" but feels better when he uses his inhalers and nebulizer. No current exacerbation. Does not endorse any other symptoms or complaints.     Outpatient Medications Prior to Visit  Medication Sig Dispense Refill  . albuterol (PROVENTIL HFA;VENTOLIN HFA) 108 (90 Base) MCG/ACT inhaler 1-2 puffs q 6 hours x 3 days then prn SOB 1 Inhaler 2  . amLODipine (NORVASC) 10 MG tablet Take 1 tablet (10 mg total) by mouth daily. 90 tablet 3  . atenolol (TENORMIN) 50 MG tablet Take 1 tablet (50 mg total) by mouth daily. 90 tablet 3  . cloNIDine (CATAPRES) 0.1 MG tablet Take 1 tablet (0.1 mg total) by mouth 2 (two) times daily. 60 tablet 5  . fluticasone (FLOVENT HFA) 110 MCG/ACT inhaler Inhale 1 puff into the lungs 2 (two) times daily. 1 Inhaler 12  . ipratropium-albuterol (DUONEB) 0.5-2.5 (3) MG/3ML SOLN Take 3 mLs by nebulization every 4 (four) hours as needed. Shortness of breathe 180 mL 0  . losartan (COZAAR) 50 MG tablet Take 1 tablet (50 mg total) by mouth daily. 90 tablet 3  . terazosin (HYTRIN) 2 MG capsule Take 1 capsule (2 mg total) by mouth daily. 90 capsule 3  . montelukast (SINGULAIR) 10 MG tablet Take 1 tablet (10 mg total) by mouth at bedtime. (Patient not taking: Reported on 02/22/2018) 30 tablet 5  . amoxicillin-clavulanate (AUGMENTIN) 875-125 MG tablet Take 1 tablet by mouth 2 (two) times daily. 20 tablet 0  .  cyclobenzaprine (FLEXERIL) 10 MG tablet Take 1 tablet (10 mg total) by mouth at bedtime. 10 tablet 0  . dicyclomine (BENTYL) 10 MG capsule Take 1 capsule (10 mg total) by mouth 4 (four) times daily -  before meals and at bedtime. 30 capsule 0  . naproxen (NAPROSYN) 500 MG tablet Take 1 tablet (500 mg total) by mouth 2 (two) times daily with a meal. 30 tablet 0   No facility-administered medications prior to visit.      ROS Review of Systems  Constitutional: Negative for chills, fever and malaise/fatigue.  Eyes: Negative for blurred vision.  Respiratory: Positive for shortness of breath.   Cardiovascular: Negative for chest pain and palpitations.  Gastrointestinal: Negative for abdominal pain and nausea.  Genitourinary: Negative for dysuria and hematuria.  Musculoskeletal: Negative for joint pain and myalgias.  Skin: Negative for rash.  Neurological: Negative for tingling and headaches.  Psychiatric/Behavioral: Negative for depression. The patient is not nervous/anxious.     Objective:  Wt 205 lb 3.2 oz (93.1 kg)   BMI 30.30 kg/m   Vitals:   02/22/18 1451  BP: 110/76  Pulse: 66  Temp: 98 F (36.7 C)  SpO2: 98%      Physical Exam  Constitutional: He is oriented to person, place, and time.  Well developed, well nourished, NAD, polite  HENT:  Head: Normocephalic and atraumatic.  Eyes: No scleral icterus.  Neck: Normal range  of motion. Neck supple. No thyromegaly present.  Cardiovascular: Normal rate, regular rhythm and normal heart sounds.  Pulmonary/Chest: Effort normal. No respiratory distress.  Coarse rales diffusely and bilaterally  Abdominal: Soft. Bowel sounds are normal. There is no tenderness.  Musculoskeletal: He exhibits no edema.  Neurological: He is alert and oriented to person, place, and time.  Skin: Skin is warm and dry. No rash noted. No erythema. No pallor.  Psychiatric: He has a normal mood and affect. His behavior is normal. Thought content normal.   Vitals reviewed.    Assessment & Plan:    1. Screening for colon cancer - Pt turned in FIT testing.   2. Moderate persistent asthma without complication - montelukast (SINGULAIR) 10 MG tablet; Take 1 tablet (10 mg total) by mouth at bedtime.  Dispense: 30 tablet; Refill: 5 - albuterol (PROVENTIL HFA;VENTOLIN HFA) 108 (90 Base) MCG/ACT inhaler; 1-2 puffs q 6 hours x 3 days then prn SOB  Dispense: 1 Inhaler; Refill: 2 - ipratropium-albuterol (DUONEB) 0.5-2.5 (3) MG/3ML SOLN; Take 3 mLs by nebulization every 6 (six) hours as needed. Shortness of breathe  Dispense: 180 mL; Refill: 1  3. Medication refill - cloNIDine (CATAPRES) 0.1 MG tablet; Take 1 tablet (0.1 mg total) by mouth 2 (two) times daily.  Dispense: 60 tablet; Refill: 5   Meds ordered this encounter  Medications  . montelukast (SINGULAIR) 10 MG tablet    Sig: Take 1 tablet (10 mg total) by mouth at bedtime.    Dispense:  30 tablet    Refill:  5    Order Specific Question:   Supervising Provider    Answer:   Quentin Angst [id number]  . cloNIDine (CATAPRES) 0.1 MG tablet    Sig: Take 1 tablet (0.1 mg total) by mouth 2 (two) times daily.    Dispense:  60 tablet    Refill:  5    Order Specific Question:   Supervising Provider    Answer:   Quentin Angst [id number]  . albuterol (PROVENTIL HFA;VENTOLIN HFA) 108 (90 Base) MCG/ACT inhaler    Sig: 1-2 puffs q 6 hours x 3 days then prn SOB    Dispense:  1 Inhaler    Refill:  2    Order Specific Question:   Supervising Provider    Answer:   Quentin Angst [id number]  . ipratropium-albuterol (DUONEB) 0.5-2.5 (3) MG/3ML SOLN    Sig: Take 3 mLs by nebulization every 6 (six) hours as needed. Shortness of breathe    Dispense:  180 mL    Refill:  1    Order Specific Question:   Supervising Provider    Answer:   Quentin Angst [[id number]]    Follow-up: Return in about 6 months (around 08/25/2018), or if symptoms worsen or fail to improve.   Loletta Specter  PA

## 2018-03-07 ENCOUNTER — Encounter (HOSPITAL_COMMUNITY): Payer: Self-pay | Admitting: Family Medicine

## 2018-03-07 ENCOUNTER — Ambulatory Visit (HOSPITAL_COMMUNITY)
Admission: EM | Admit: 2018-03-07 | Discharge: 2018-03-07 | Disposition: A | Discharge status: Home / Self Care | Payer: Medicare Other | Attending: Family Medicine | Admitting: Family Medicine

## 2018-03-07 DIAGNOSIS — R0602 Shortness of breath: Secondary | ICD-10-CM | POA: Diagnosis not present

## 2018-03-07 DIAGNOSIS — J441 Chronic obstructive pulmonary disease with (acute) exacerbation: Secondary | ICD-10-CM | POA: Diagnosis not present

## 2018-03-07 DIAGNOSIS — R062 Wheezing: Secondary | ICD-10-CM | POA: Diagnosis not present

## 2018-03-07 MED ORDER — METHYLPREDNISOLONE SODIUM SUCC 125 MG IJ SOLR
80.0000 mg | Freq: Once | INTRAMUSCULAR | Status: AC
  Administered 2018-03-07: 80 mg via INTRAMUSCULAR

## 2018-03-07 MED ORDER — PREDNISONE 20 MG PO TABS: 40.0000 mg | ORAL_TABLET | Freq: Every day | ORAL | 0 refills | Status: AC

## 2018-03-07 MED ORDER — IPRATROPIUM-ALBUTEROL 0.5-2.5 (3) MG/3ML IN SOLN
3.0000 mL | Freq: Once | RESPIRATORY_TRACT | Status: AC
  Administered 2018-03-07: 3 mL via RESPIRATORY_TRACT

## 2018-03-07 MED ORDER — ALBUTEROL SULFATE (2.5 MG/3ML) 0.083% IN NEBU: 2.5000 mg | INHALATION_SOLUTION | Freq: Once | RESPIRATORY_TRACT | Status: DC

## 2018-03-07 MED ORDER — METHYLPREDNISOLONE SODIUM SUCC 125 MG IJ SOLR
INTRAMUSCULAR | Status: AC
  Filled 2018-03-07: qty 2

## 2018-03-07 MED ORDER — IPRATROPIUM-ALBUTEROL 0.5-2.5 (3) MG/3ML IN SOLN
RESPIRATORY_TRACT | Status: AC
  Filled 2018-03-07: qty 3

## 2018-03-07 NOTE — ED Triage Notes (Signed)
Pt with hx of asthma here for SOB. He denies cough or illness. He has been using his nebulizer and rescue inhaler and not getting much relief.

## 2018-03-07 NOTE — Discharge Instructions (Addendum)
May continue with use of your nebulizers and inhaler as previously prescribed.  We have provided a steroid injection today and will do a 5 day course of prednisone as well which should help with your wheezing and shortness of breath. If you develop worsening of shortness of breath, dizziness, increased work of breathing, fevers, chest pain or otherwise worsening please return or go to the Er.

## 2018-03-07 NOTE — ED Provider Notes (Signed)
[facility]    CSN: [id number] Arrival date & time: 03/07/18  1021     History   Chief Complaint Chief Complaint  Patient presents with  . Asthma    HPI Evan Brock is a 67 y.o. male.   [place] presents with complaints of shortness of breath  And wheezing which has been persistent for the past 4 days. Worse at night and with walking up stairs. Without much cough. Denies  Congestion, ear pain, fevers, sore throat or known ill contacts. History of asthma/copd. Uses daily flovent, rescue albuterol and duonebs. Last neb this morning, does help somewhat. Takes daily singulair as well. Without peripheral edema or other swelling. Hx htn, thrombocytopenia.     ROS per HPI.      Past Medical History:  Diagnosis Date  . Arthritis    "hips" (10/01/2013)  . Asthma   . COPD (chronic obstructive pulmonary disease) (HCC)   . Exertional shortness of breath   . Hypertension     Patient Active Problem List   Diagnosis Date Noted  . Leukocytosis 10/04/2015  . COPD exacerbation (HCC) 10/01/2015  . Essential hypertension, benign 03/01/2015  . Asthma with acute exacerbation 01/26/2015  . Thrombocytopenia, unspecified (HCC) 11/06/2013  . Acute respiratory failure (HCC) with hypoxia 10/01/2013  . Acute renal failure (HCC) 10/01/2013    Past Surgical History:  Procedure Laterality Date  . JOINT REPLACEMENT    . TOTAL HIP ARTHROPLASTY Right 2010  . TOTAL HIP ARTHROPLASTY Left 08/19/2013   Procedure: TOTAL HIP ARTHROPLASTY ANTERIOR APPROACH;  Surgeon: Velna Ochs, MD;  Location: [facility];  Service: Orthopedics;  Laterality: Left;       Home Medications    Prior to Admission medications   Medication Sig Start Date End Date Taking? Authorizing Provider  albuterol (PROVENTIL HFA;VENTOLIN HFA) 108 (90 Base) MCG/ACT inhaler 1-2 puffs q 6 hours x 3 days then prn SOB 02/22/18   Loletta Specter, PA-C  amLODipine (NORVASC) 10 MG tablet Take 1 tablet (10 mg total) by mouth  daily. 11/05/17   Loletta Specter, PA-C  atenolol (TENORMIN) 50 MG tablet Take 1 tablet (50 mg total) by mouth daily. 11/05/17   Loletta Specter, PA-C  cloNIDine (CATAPRES) 0.1 MG tablet Take 1 tablet (0.1 mg total) by mouth 2 (two) times daily. 02/22/18   Loletta Specter, PA-C  fluticasone (FLOVENT HFA) 110 MCG/ACT inhaler Inhale 1 puff into the lungs 2 (two) times daily. 11/05/17   Loletta Specter, PA-C  ipratropium-albuterol (DUONEB) 0.5-2.5 (3) MG/3ML SOLN Take 3 mLs by nebulization every 6 (six) hours as needed. Shortness of breathe 02/22/18   Loletta Specter, PA-C  losartan (COZAAR) 50 MG tablet Take 1 tablet (50 mg total) by mouth daily. 11/05/17   Loletta Specter, PA-C  montelukast (SINGULAIR) 10 MG tablet Take 1 tablet (10 mg total) by mouth at bedtime. 02/22/18   Loletta Specter, PA-C  predniSONE (DELTASONE) 20 MG tablet Take 2 tablets (40 mg total) by mouth daily with breakfast for 5 days. 03/07/18 03/12/18  Georgetta Haber, NP  terazosin (HYTRIN) 2 MG capsule Take 1 capsule (2 mg total) by mouth daily. 11/05/17   Loletta Specter, PA-C    Family History Family History  Problem Relation Age of Onset  . Diabetes Mother   . Hypertension Father   . Lupus Sister     Social History Social History   Tobacco Use  . Smoking status: Former Smoker    Packs/day:  0.00    Years: 0.00    Pack years: 0.00    Types: Cigarettes    Last attempt to quit: 10/23/1990    Years since quitting: 27.3  . Smokeless tobacco: Never Used  . Tobacco comment: 10/01/2013 "quit smoking cigarettes~ 15-20 yr ago"  Substance Use Topics  . Alcohol use: Yes    Alcohol/week: 7.2 oz    Types: 12 Cans of beer per week    Comment: 08/19/2013 "3 times/wk; 3-4 beers/night"  . Drug use: Yes    Types: Marijuana    Comment: 10/01/2013 "last drug use was 10 yr ago"     Allergies   Patient has no known allergies.   Review of Systems Review of Systems   Physical Exam Triage Vital Signs ED  Triage Vitals [03/07/18 1106]  Enc Vitals Group     BP 124/87     Pulse Rate 63     Resp 18     Temp 98.5 F (36.9 C)     Temp src      SpO2 95 %     Weight      Height      Head Circumference      Peak Flow      Pain Score 0     Pain Loc      Pain Edu?      Excl. in GC?    No data found.  Updated Vital Signs BP 124/87   Pulse 63   Temp 98.5 F (36.9 C)   Resp 18   SpO2 95%    Physical Exam  Constitutional: He is oriented to person, place, and time. He appears well-developed and well-nourished.  HENT:  Head: Normocephalic and atraumatic.  Right Ear: Tympanic membrane, external ear and ear canal normal.  Left Ear: Tympanic membrane, external ear and ear canal normal.  Nose: Nose normal. Right sinus exhibits no maxillary sinus tenderness and no frontal sinus tenderness. Left sinus exhibits no maxillary sinus tenderness and no frontal sinus tenderness.  Mouth/Throat: Uvula is midline, oropharynx is clear and moist and mucous membranes are normal.  Eyes: Pupils are equal, round, and reactive to light. Conjunctivae are normal.  Neck: Normal range of motion.  Cardiovascular: Normal rate and regular rhythm.  Pulmonary/Chest: Effort normal. He has wheezes. He has rales.  Course rales and expiratory wheezing noted throughout; without increased work of breathing or tachypnea noted   Lymphadenopathy:    He has no cervical adenopathy.  Neurological: He is alert and oriented to person, place, and time.  Skin: Skin is warm and dry.  Vitals reviewed.    UC Treatments / Results  Labs (all labs ordered are listed, but only abnormal results are displayed) Labs Reviewed - No data to display  EKG None  Radiology No results found.  Procedures Procedures (including critical care time)  Medications Ordered in UC Medications  ipratropium-albuterol (DUONEB) 0.5-2.5 (3) MG/3ML nebulizer solution 3 mL (3 mLs Nebulization Given 03/07/18 1206)  methylPREDNISolone sodium succinate  (SOLU-MEDROL) 125 mg/2 mL injection 80 mg (80 mg Intramuscular Given 03/07/18 1204)    Initial Impression / Assessment and Plan / UC Course  I have reviewed the triage vital signs and the nursing notes.  Pertinent labs & imaging results that were available during my care of the patient were reviewed by me and considered in my medical decision making (see chart for details).     Duoneb and 80mg  of solumedrol provided in clinic for what appears to be COPD  exacerbation. Lung sound improvement and patient endorses feeling improved with breathing. Without chest pain , edema, or URI symptoms. Afebrile, without tachycardia, tachypnea or hypoxia noted. Xray deferred at this time. 5 days of prednisone. Use of nebs and albuterol as needed. Return precautions provided. Patient verbalized understanding and agreeable to plan.  Ambulatory out of clinic without difficulty.    Final Clinical Impressions(s) / UC Diagnoses   Final diagnoses:  COPD exacerbation [facility])     Discharge Instructions     May continue with use of your nebulizers and inhaler as previously prescribed.  We have provided a steroid injection today and will do a 5 day course of prednisone as well which should help with your wheezing and shortness of breath. If you develop worsening of shortness of breath, dizziness, increased work of breathing, fevers, chest pain or otherwise worsening please return or go to the Er.      ED Prescriptions    Medication Sig Dispense Auth. Provider   predniSONE (DELTASONE) 20 MG tablet Take 2 tablets (40 mg total) by mouth daily with breakfast for 5 days. 10 tablet Georgetta Haber, NP     Controlled Substance Prescriptions [place] Controlled Substance Registry consulted? Not Applicable   Georgetta Haber, NP 03/07/18 1222

## 2018-03-12 LAB — FECAL OCCULT BLOOD, IMMUNOCHEMICAL

## 2018-03-13 ENCOUNTER — Encounter (HOSPITAL_COMMUNITY): Payer: Medicare Other

## 2018-03-13 ENCOUNTER — Encounter: Payer: Medicare Other | Admitting: Vascular Surgery

## 2018-05-05 IMAGING — DX DG CHEST 2V
2 series · 2 of 2 positions shown · non-contrast
Comparison: 10/01/2015

CLINICAL DATA: Cough for 4 days

EXAM:
CHEST - 2 VIEW

[w chest pa]
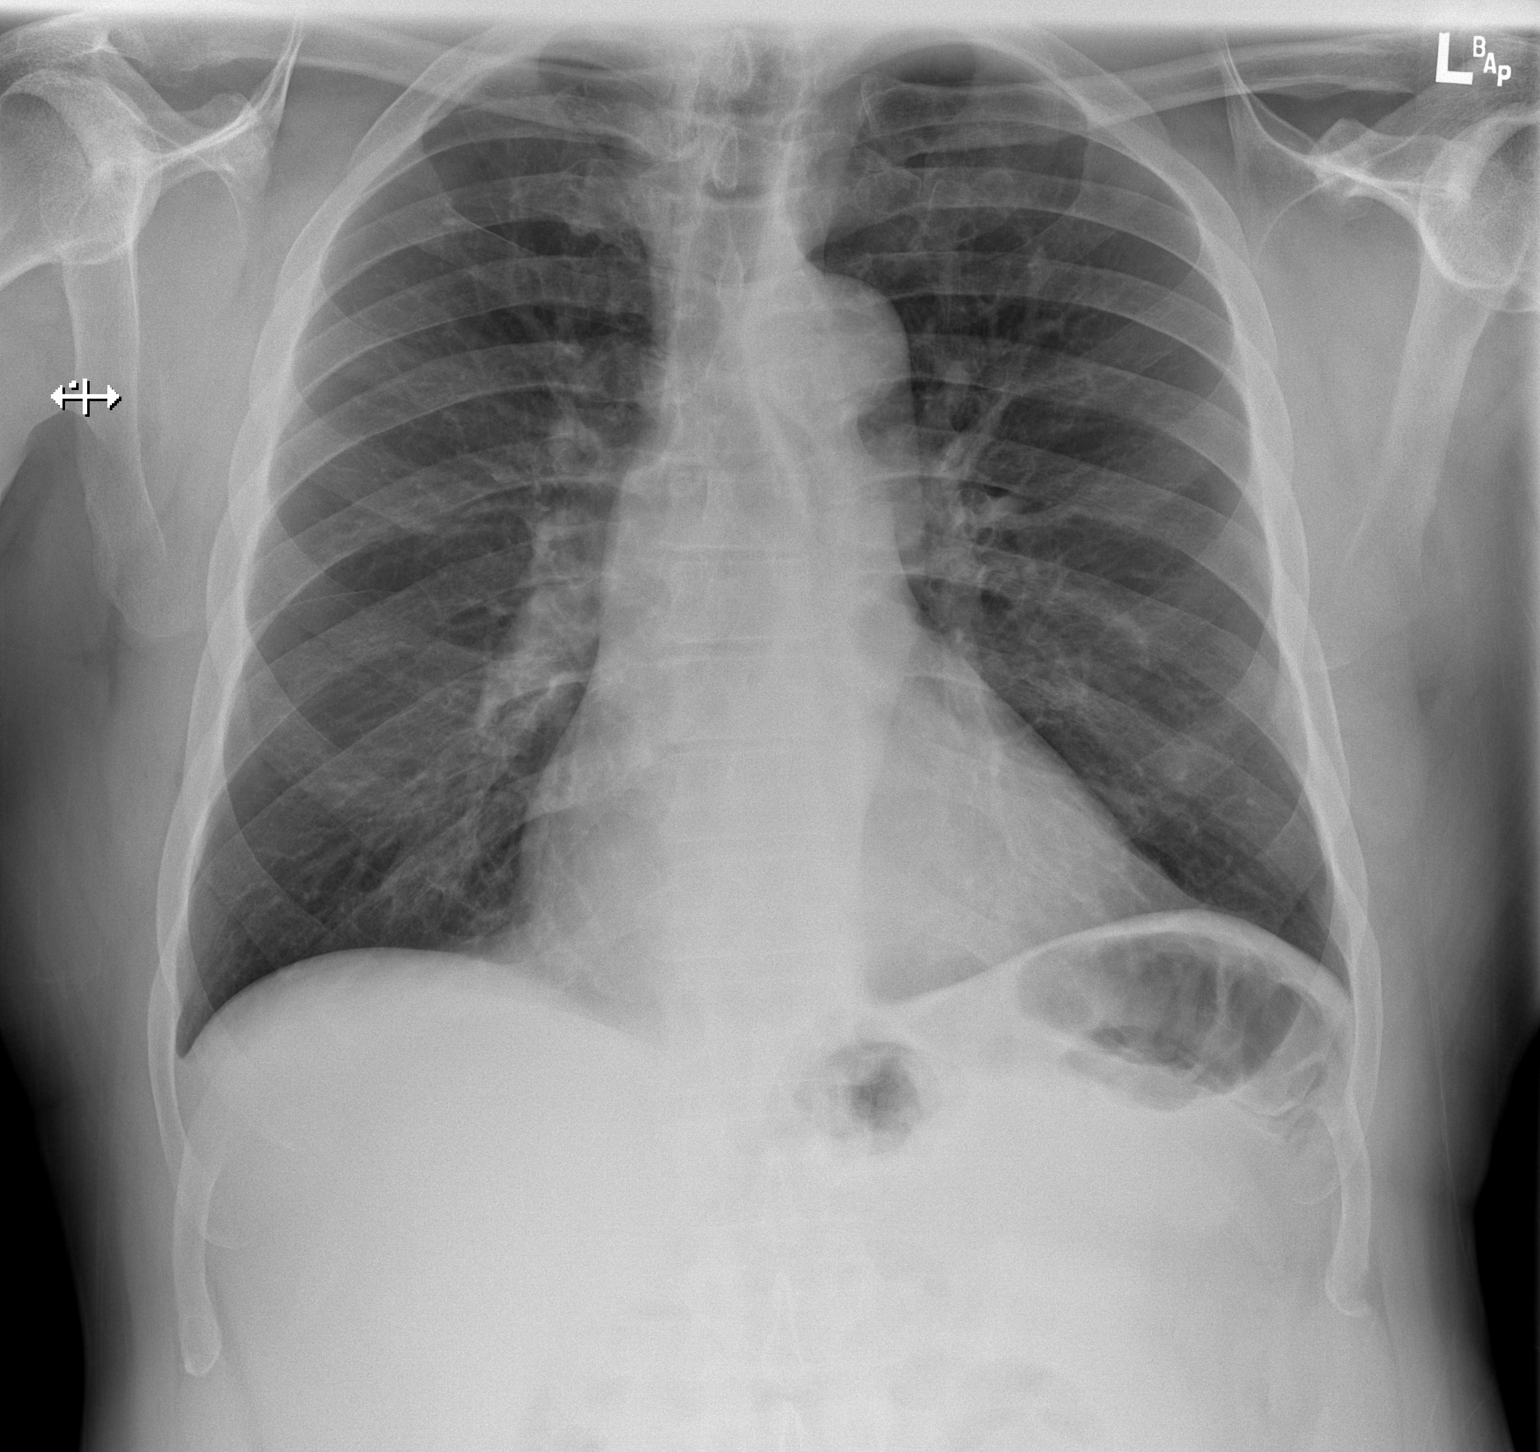

[w chest lat]
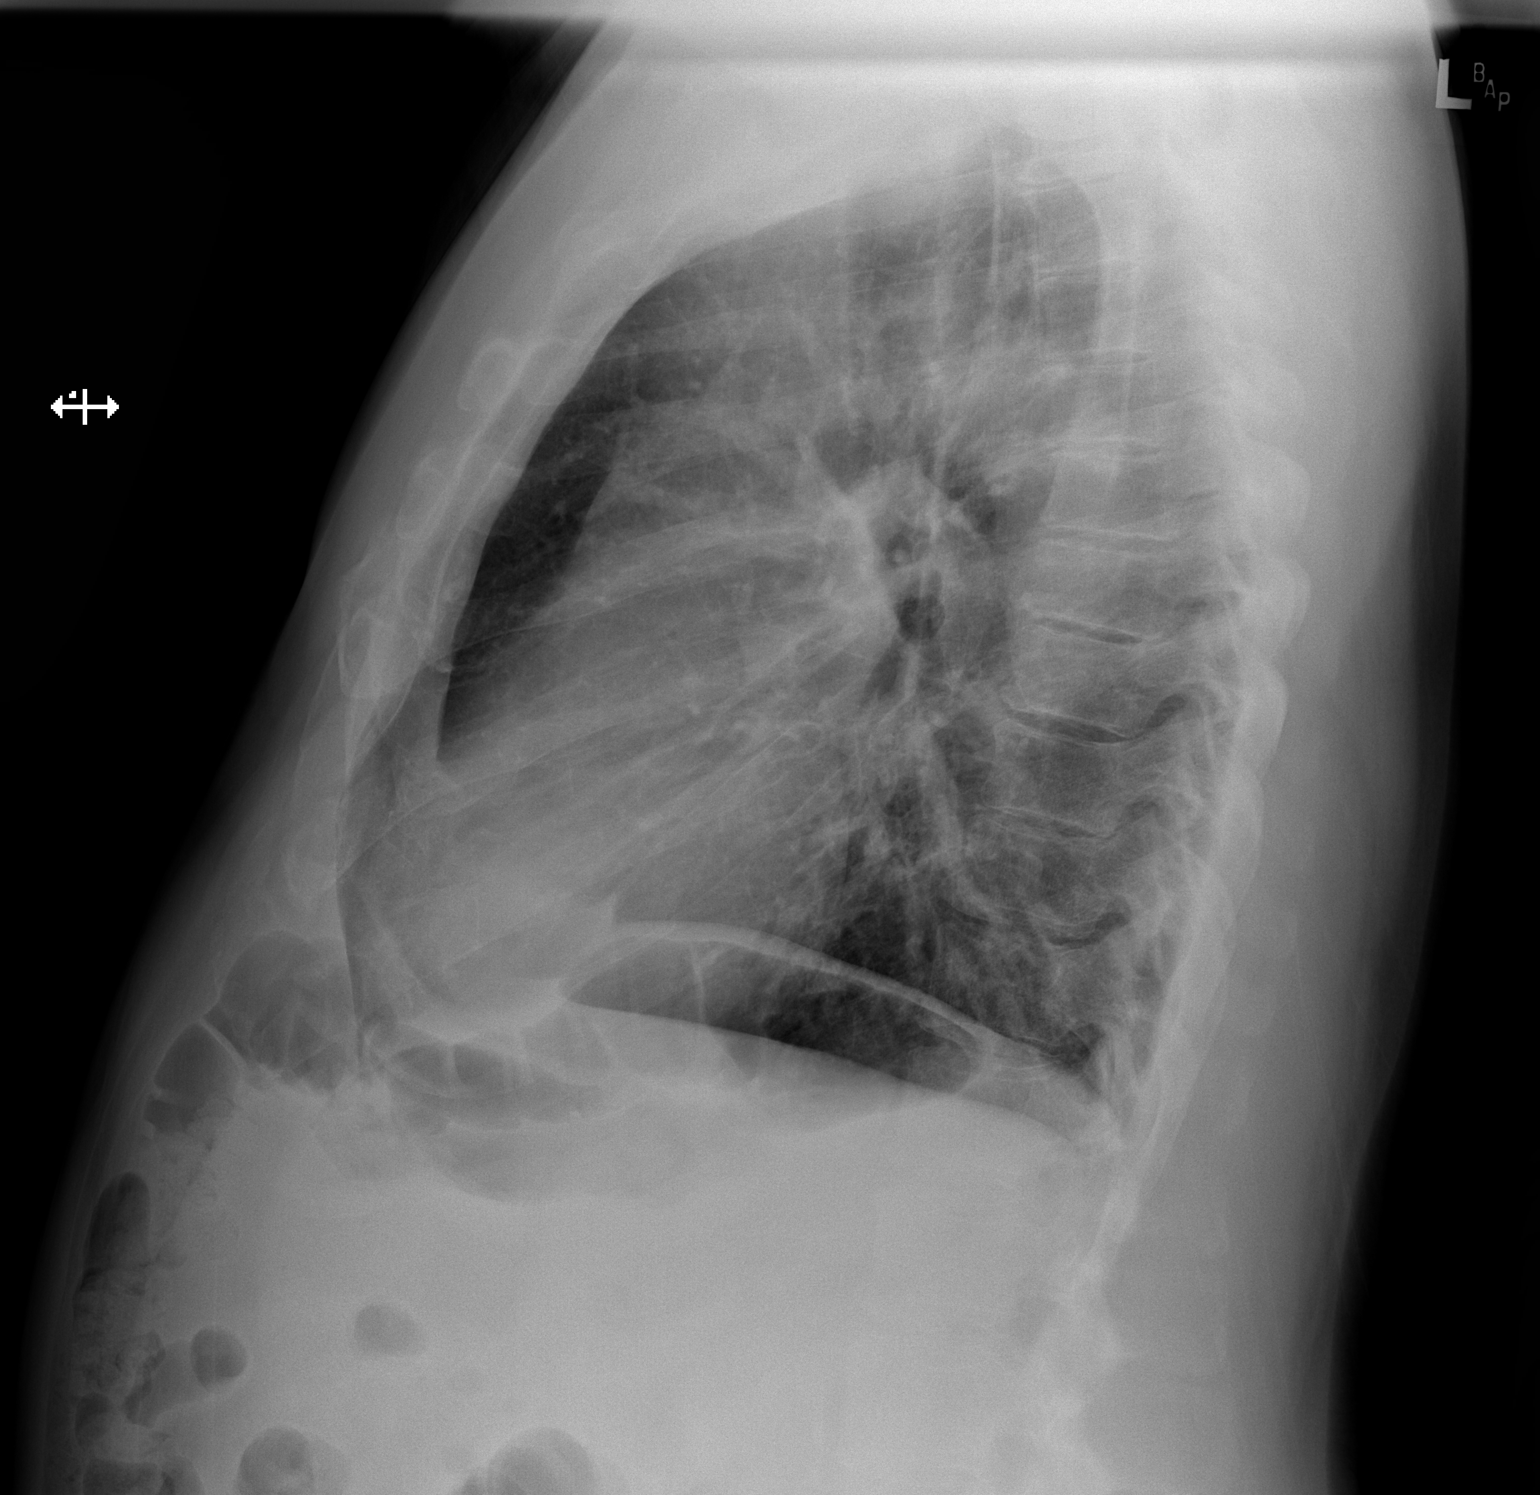

[2 of 2 positions shown; findings below may reference images not displayed]

FINDINGS: Mild cardiomegaly.  No consolidation or effusion.  No pneumothorax.
IMPRESSION: No active cardiopulmonary disease.

## 2018-05-30 ENCOUNTER — Other Ambulatory Visit (INDEPENDENT_AMBULATORY_CARE_PROVIDER_SITE_OTHER): Payer: Self-pay | Admitting: Physician Assistant

## 2018-05-30 DIAGNOSIS — J4540 Moderate persistent asthma, uncomplicated: Secondary | ICD-10-CM

## 2018-05-30 NOTE — Telephone Encounter (Signed)
FWD to PCP. Rodrick Payson S Drisana Schweickert, CMA  

## 2018-06-03 ENCOUNTER — Encounter (INDEPENDENT_AMBULATORY_CARE_PROVIDER_SITE_OTHER): Payer: Self-pay | Admitting: Physician Assistant

## 2018-06-03 ENCOUNTER — Ambulatory Visit (HOSPITAL_COMMUNITY)
Admission: RE | Admit: 2018-06-03 | Discharge: 2018-06-03 | Disposition: A | Discharge status: Home / Self Care | Payer: Medicare Other | Source: Ambulatory Visit | Attending: Physician Assistant | Admitting: Physician Assistant

## 2018-06-03 ENCOUNTER — Other Ambulatory Visit: Payer: Self-pay

## 2018-06-03 ENCOUNTER — Ambulatory Visit (INDEPENDENT_AMBULATORY_CARE_PROVIDER_SITE_OTHER): Payer: Medicare Other | Admitting: Physician Assistant

## 2018-06-03 VITALS — BP 114/73 | HR 60 | Temp 97.9°F | Ht 69.0 in | Wt 206.8 lb

## 2018-06-03 DIAGNOSIS — M545 Low back pain, unspecified: Secondary | ICD-10-CM

## 2018-06-03 DIAGNOSIS — E119 Type 2 diabetes mellitus without complications: Secondary | ICD-10-CM | POA: Diagnosis not present

## 2018-06-03 DIAGNOSIS — M542 Cervicalgia: Secondary | ICD-10-CM

## 2018-06-03 DIAGNOSIS — M47892 Other spondylosis, cervical region: Secondary | ICD-10-CM | POA: Diagnosis not present

## 2018-06-03 DIAGNOSIS — N529 Male erectile dysfunction, unspecified: Secondary | ICD-10-CM

## 2018-06-03 DIAGNOSIS — J4540 Moderate persistent asthma, uncomplicated: Secondary | ICD-10-CM

## 2018-06-03 DIAGNOSIS — Z1211 Encounter for screening for malignant neoplasm of colon: Secondary | ICD-10-CM | POA: Diagnosis not present

## 2018-06-03 DIAGNOSIS — G8929 Other chronic pain: Secondary | ICD-10-CM

## 2018-06-03 LAB — POCT GLYCOSYLATED HEMOGLOBIN (HGB A1C): HEMOGLOBIN A1C: 5.8 % — AB (ref 4.0–5.6)

## 2018-06-03 MED ORDER — NAPROXEN 500 MG PO TABS: 500.0000 mg | ORAL_TABLET | Freq: Two times a day (BID) | ORAL | 0 refills | Status: DC

## 2018-06-03 MED ORDER — ACETAMINOPHEN-CODEINE #3 300-30 MG PO TABS: 1.0000 | ORAL_TABLET | Freq: Three times a day (TID) | ORAL | 0 refills | Status: DC | PRN

## 2018-06-03 MED ORDER — IPRATROPIUM-ALBUTEROL 0.5-2.5 (3) MG/3ML IN SOLN: RESPIRATORY_TRACT | 0 refills | Status: DC

## 2018-06-03 MED ORDER — ALBUTEROL SULFATE HFA 108 (90 BASE) MCG/ACT IN AERS: INHALATION_SPRAY | RESPIRATORY_TRACT | 2 refills | Status: DC

## 2018-06-03 NOTE — Progress Notes (Signed)
Subjective:  Patient ID: Evan Brock, male    DOB: 10/09/1951  Age: 67 y.o. MRN: [id number]  CC: f/u DM  HPI Evan Brock a 67 y.o.malewith a medical history of arthritis, asthma, COPD, DM2, bilateral hip replacement, and hypertension presentson f/u of DM. Last A1c 6.3% six months ago. Not taking any anti-glycemics. Diets and exercises. A1c 5.8% today.     Main complaint is of left sided neck pain since approximately two years ago. Pain rated 8/10 at worst, 4/10 regularly. There is associated radiculopathy to the left shoulder region. Painful to rotate to the left and painful with right lateral flexion. No weakness or paralysis of the LUE. Says he was previously told many years ago that he had some disc space narrowing and his condition would worsen with time. Takes OTC analgesics with little relief. Does not endorse any other symptoms or complaints.     Outpatient Medications Prior to Visit  Medication Sig Dispense Refill  . albuterol (PROVENTIL HFA;VENTOLIN HFA) 108 (90 Base) MCG/ACT inhaler 1-2 puffs q 6 hours x 3 days then prn SOB 1 Inhaler 2  . amLODipine (NORVASC) 10 MG tablet Take 1 tablet (10 mg total) by mouth daily. 90 tablet 3  . atenolol (TENORMIN) 50 MG tablet Take 1 tablet (50 mg total) by mouth daily. 90 tablet 3  . cloNIDine (CATAPRES) 0.1 MG tablet Take 1 tablet (0.1 mg total) by mouth 2 (two) times daily. 60 tablet 5  . fluticasone (FLOVENT HFA) 110 MCG/ACT inhaler Inhale 1 puff into the lungs 2 (two) times daily. 1 Inhaler 12  . ipratropium-albuterol (DUONEB) 0.5-2.5 (3) MG/3ML SOLN TAKE 3 MLS BY NEBULIZATION EVERY 6 HOURS AS NEEDED FOR SHORTNESS OF BREATH 180 mL 0  . losartan (COZAAR) 50 MG tablet Take 1 tablet (50 mg total) by mouth daily. 90 tablet 3  . montelukast (SINGULAIR) 10 MG tablet Take 1 tablet (10 mg total) by mouth at bedtime. 30 tablet 5  . terazosin (HYTRIN) 2 MG capsule Take 1 capsule (2 mg total) by mouth daily. 90 capsule 3   No  facility-administered medications prior to visit.      ROS Review of Systems  Constitutional: Negative for chills, fever and malaise/fatigue.  Eyes: Negative for blurred vision.  Respiratory: Negative for shortness of breath.   Cardiovascular: Negative for chest pain and palpitations.  Gastrointestinal: Negative for abdominal pain and nausea.  Genitourinary: Negative for dysuria and hematuria.  Musculoskeletal: Positive for back pain and neck pain. Negative for joint pain and myalgias.  Skin: Negative for rash.  Neurological: Negative for tingling and headaches.  Psychiatric/Behavioral: Negative for depression. The patient is not nervous/anxious.     Objective:  BP 114/73 (BP Location: Left Arm, Patient Position: Sitting, Cuff Size: Large)   Pulse 60   Temp 97.9 F (36.6 C) (Oral)   Ht 5\' 9"  (1.753 m)   Wt 206 lb 12.8 oz (93.8 kg)   SpO2 90%   BMI 30.54 kg/m   BP/Weight 06/03/2018 03/07/2018 02/22/2018  Systolic BP 114 124 110  Diastolic BP 73 87 76  Wt. (Lbs) 206.8 - 205.2  BMI 30.54 - 30.3      Physical Exam  Constitutional: He is oriented to person, place, and time.  Well developed, obese, NAD, polite  HENT:  Head: Normocephalic and atraumatic.  Eyes: No scleral icterus.  Neck: Normal range of motion. Neck supple. No thyromegaly present.  Cardiovascular: Normal rate, regular rhythm and normal heart sounds.  Pulmonary/Chest: Effort normal and breath  sounds normal.  Musculoskeletal: He exhibits no edema.  Full aROM of back. Mildly limited left rotation of neck and pain elicited on left side of neck with right lateral flexion of neck. No edema, no increased muscular tonicity. LUE with full aROM. Strength 5/5 of the LUE and RUE  Neurological: He is alert and oriented to person, place, and time.  Skin: Skin is warm and dry. No rash noted. No erythema. No pallor.  Psychiatric: He has a normal mood and affect. His behavior is normal. Thought content normal.  Vitals  reviewed.    Assessment & Plan:   1. Chronic neck pain - DG Cervical Spine Complete; Future - naproxen (NAPROSYN) 500 MG tablet; Take 1 tablet (500 mg total) by mouth 2 (two) times daily with a meal.  Dispense: 60 tablet; Refill: 0 - acetaminophen-codeine (TYLENOL #3) 300-30 MG tablet; Take 1 tablet by mouth every 8 (eight) hours as needed for moderate pain.  Dispense: 63 tablet; Refill: 0  2. Chronic midline low back pain without sciatica - AMB referral to orthopedics - naproxen (NAPROSYN) 500 MG tablet; Take 1 tablet (500 mg total) by mouth 2 (two) times daily with a meal.  Dispense: 60 tablet; Refill: 0 - acetaminophen-codeine (TYLENOL #3) 300-30 MG tablet; Take 1 tablet by mouth every 8 (eight) hours as needed for moderate pain.  Dispense: 63 tablet; Refill: 0  3. Type 2 diabetes mellitus without complication, without long-term current use of insulin (HCC) - HgB A1c 5.8% today  4. Screening for colon cancer - Fecal occult blood, imunochemical  5. Moderate persistent asthma without complication - albuterol (PROVENTIL HFA;VENTOLIN HFA) 108 (90 Base) MCG/ACT inhaler; 1-2 puffs q 6 hours x 3 days then prn SOB  Dispense: 1 Inhaler; Refill: 2 - ipratropium-albuterol (DUONEB) 0.5-2.5 (3) MG/3ML SOLN; TAKE 3 MLS BY NEBULIZATION EVERY 6 HOURS AS NEEDED FOR SHORTNESS OF BREATH  Dispense: 180 mL; Refill: 0  6. Erectile dysfunction, unspecified erectile dysfunction type - Pt requested medication after discharge. I advised against PDE inhibitors for now due to use of Terazosin.    Meds ordered this encounter  Medications  . naproxen (NAPROSYN) 500 MG tablet    Sig: Take 1 tablet (500 mg total) by mouth 2 (two) times daily with a meal.    Dispense:  60 tablet    Refill:  0    Order Specific Question:   Supervising Provider    Answer:   Hoy Register [[id number]]  . acetaminophen-codeine (TYLENOL #3) 300-30 MG tablet    Sig: Take 1 tablet by mouth every 8 (eight) hours as needed for moderate  pain.    Dispense:  63 tablet    Refill:  0    Order Specific Question:   Supervising Provider    Answer:   Hoy Register [[id number]]  . albuterol (PROVENTIL HFA;VENTOLIN HFA) 108 (90 Base) MCG/ACT inhaler    Sig: 1-2 puffs q 6 hours x 3 days then prn SOB    Dispense:  1 Inhaler    Refill:  2    Order Specific Question:   Supervising Provider    Answer:   Hoy Register [[id number]]  . ipratropium-albuterol (DUONEB) 0.5-2.5 (3) MG/3ML SOLN    Sig: TAKE 3 MLS BY NEBULIZATION EVERY 6 HOURS AS NEEDED FOR SHORTNESS OF BREATH    Dispense:  180 mL    Refill:  0    Order Specific Question:   Supervising Provider    Answer:   Hoy Register [[id number]]    Follow-up:  Return in about 8 weeks (around 07/29/2018) for neck pain.   Loletta Specter PA

## 2018-06-03 NOTE — Patient Instructions (Signed)
Cervical Radiculopathy  Cervical radiculopathy means that a nerve in the neck is pinched or bruised. This can cause pain or loss of feeling (numbness) that runs from your neck to your arm and fingers.  Follow these instructions at home:  Managing pain  ? Take over-the-counter and prescription medicines only as told by your doctor.  ? If directed, put ice on the injured or painful area.  ? Put ice in a plastic bag.  ? Place a towel between your skin and the bag.  ? Leave the ice on for 20 minutes, 2?3 times per day.  ? If ice does not help, you can try using heat. Take a warm shower or warm bath, or use a heat pack as told by your doctor.  ? You may try a gentle neck and shoulder massage.  Activity  ? Rest as needed. Follow instructions from your doctor about any activities to avoid.  ? Do exercises as told by your doctor or physical therapist.  General instructions  ? If you were given a soft collar, wear it as told by your doctor.  ? Use a flat pillow when you sleep.  ? Keep all follow-up visits as told by your doctor. This is important.  Contact a doctor if:  ? Your condition does not improve with treatment.  Get help right away if:  ? Your pain gets worse and is not controlled with medicine.  ? You lose feeling or feel weak in your hand, arm, face, or leg.  ? You have a fever.  ? You have a stiff neck.  ? You cannot control when you poop or pee (have incontinence).  ? You have trouble with walking, balance, or talking.  This information is not intended to replace advice given to you by your health care provider. Make sure you discuss any questions you have with your health care provider.  Document Released: 09/28/2011 Document Revised: 03/16/2016 Document Reviewed: 12/03/2014  Elsevier Interactive Patient Education ? 2018 [employer]

## 2018-06-05 ENCOUNTER — Telehealth (INDEPENDENT_AMBULATORY_CARE_PROVIDER_SITE_OTHER): Payer: Self-pay

## 2018-06-05 NOTE — Telephone Encounter (Signed)
-----   Message from Loletta Specter, PA-C sent at 06/03/2018  1:39 PM EDT ----- Joint space narrowing and arthritic changes. Referral to ortho has been placed. It is likely he will receive injections.

## 2018-06-05 NOTE — Telephone Encounter (Signed)
Patient aware that XR revealed joint space narrowing and arthritic changes. Referral has been placed to ortho. Explained to patient that someone from the ortho office will contact him directly to schedule his appointment; likely that he will receive injections. Maryjean Morn, CMA

## 2018-06-07 ENCOUNTER — Encounter (INDEPENDENT_AMBULATORY_CARE_PROVIDER_SITE_OTHER): Payer: Self-pay | Admitting: Family Medicine

## 2018-06-07 ENCOUNTER — Ambulatory Visit (INDEPENDENT_AMBULATORY_CARE_PROVIDER_SITE_OTHER): Payer: Medicare Other | Admitting: Family Medicine

## 2018-06-07 DIAGNOSIS — M545 Low back pain, unspecified: Secondary | ICD-10-CM

## 2018-06-07 DIAGNOSIS — G8929 Other chronic pain: Secondary | ICD-10-CM

## 2018-06-07 NOTE — Progress Notes (Signed)
Office Visit Note   Patient: Evan Brock           Date of Birth: 1951/05/20           MRN: [id number] Visit Date: 06/07/2018 Requested by: Loletta Specter, PA-C [id number] [place], [place] [address] PCP: Denny Levy  Subjective: Chief Complaint  Patient presents with  . Lower Back - Pain    HPI: He is here for low back pain.  Midline pain without radicular, worse when sitting for a long time or when standing upright.  He thinks the pain started 5 or 6 years ago after having his hips replaced.  His hips have done well.  He does not take medication for his pain.  He has not been to a therapist.  Denies any bowel or bladder dysfunction, weakness or numbness in his extremities.  Pain does not keep him awake at night.  No fever or chills, no unintentional weight change.              ROS: Otherwise noncontributory.  Objective: Vital Signs: There were no vitals taken for this visit.  Physical Exam:  Back: Leg lengths seem to be equal but when he is standing upright, his upper body is tilted to the right starting at the waist.  There is no visible scoliosis with forward flexion.  He has midline tenderness in the paraspinous muscles of the lower lumbar spine.  Stork test is positive on both sides.  Straight leg raise is negative, lower extremity strength and reflexes are normal.  Imaging: X-rays from January 18, 2018 showed very mild lumbar degenerative disc disease but overall good disc space maintenance.  He has diffuse bilateral facet DJD.  Hip joints look good, intact prostheses.  Assessment & Plan: 1.  Chronic low back pain, suspect due to facet arthropathy with malalignment.  Question whether having hips replaced might have changed his alignment. -We will try therapy.  If symptoms persist, possibly MRI scan followed by chiropractic if indicated.  No medications today.   Follow-Up Instructions: No follow-ups on file.     Procedures: No procedures  performed   PMFS History: Patient Active Problem List   Diagnosis Date Noted  . Leukocytosis 10/04/2015  . COPD exacerbation (HCC) 10/01/2015  . Essential hypertension, benign 03/01/2015  . Asthma with acute exacerbation 01/26/2015  . Thrombocytopenia, unspecified (HCC) 11/06/2013  . Acute respiratory failure (HCC) with hypoxia 10/01/2013  . Acute renal failure (HCC) 10/01/2013   Past Medical History:  Diagnosis Date  . Arthritis    "hips" (10/01/2013)  . Asthma   . COPD (chronic obstructive pulmonary disease) (HCC)   . Exertional shortness of breath   . Hypertension     Family History  Problem Relation Age of Onset  . Diabetes Mother   . Hypertension Father   . Lupus Sister     Past Surgical History:  Procedure Laterality Date  . JOINT REPLACEMENT    . TOTAL HIP ARTHROPLASTY Right 2010  . TOTAL HIP ARTHROPLASTY Left 08/19/2013   Procedure: TOTAL HIP ARTHROPLASTY ANTERIOR APPROACH;  Surgeon: Velna Ochs, MD;  Location: [facility] OR;  Service: Orthopedics;  Laterality: Left;   Social History   Occupational History  . Not on file  Tobacco Use  . Smoking status: Former Smoker    Packs/day: 0.00    Years: 0.00    Pack years: 0.00    Types: Cigarettes    Last attempt to quit: 10/23/1990    Years  since quitting: 27.6  . Smokeless tobacco: Never Used  . Tobacco comment: 10/01/2013 "quit smoking cigarettes~ 15-20 yr ago"  Substance and Sexual Activity  . Alcohol use: Yes    Alcohol/week: 12.0 standard drinks    Types: 12 Cans of beer per week    Comment: 08/19/2013 "3 times/wk; 3-4 beers/night"  . Drug use: Yes    Types: Marijuana    Comment: 10/01/2013 "last drug use was 10 yr ago"  . Sexual activity: Not Currently

## 2018-07-10 ENCOUNTER — Ambulatory Visit (HOSPITAL_COMMUNITY)
Admission: EM | Admit: 2018-07-10 | Discharge: 2018-07-10 | Disposition: A | Discharge status: Home / Self Care | Payer: Medicare Other | Attending: Family Medicine | Admitting: Family Medicine

## 2018-07-10 ENCOUNTER — Other Ambulatory Visit: Payer: Self-pay

## 2018-07-10 ENCOUNTER — Encounter (HOSPITAL_COMMUNITY): Payer: Self-pay | Admitting: Emergency Medicine

## 2018-07-10 DIAGNOSIS — R062 Wheezing: Secondary | ICD-10-CM

## 2018-07-10 DIAGNOSIS — R05 Cough: Secondary | ICD-10-CM

## 2018-07-10 DIAGNOSIS — J40 Bronchitis, not specified as acute or chronic: Secondary | ICD-10-CM | POA: Diagnosis not present

## 2018-07-10 MED ORDER — METHYLPREDNISOLONE SODIUM SUCC 125 MG IJ SOLR
80.0000 mg | Freq: Once | INTRAMUSCULAR | Status: AC
  Administered 2018-07-10: 80 mg via INTRAMUSCULAR

## 2018-07-10 MED ORDER — METHYLPREDNISOLONE SODIUM SUCC 125 MG IJ SOLR
INTRAMUSCULAR | Status: AC
  Filled 2018-07-10: qty 2

## 2018-07-10 MED ORDER — IPRATROPIUM-ALBUTEROL 0.5-2.5 (3) MG/3ML IN SOLN
RESPIRATORY_TRACT | Status: AC
  Filled 2018-07-10: qty 3

## 2018-07-10 MED ORDER — AZITHROMYCIN 250 MG PO TABS: 250.0000 mg | ORAL_TABLET | Freq: Every day | ORAL | 0 refills | Status: DC

## 2018-07-10 MED ORDER — IPRATROPIUM-ALBUTEROL 0.5-2.5 (3) MG/3ML IN SOLN
3.0000 mL | Freq: Once | RESPIRATORY_TRACT | Status: AC
  Administered 2018-07-10: 3 mL via RESPIRATORY_TRACT

## 2018-07-10 MED ORDER — PREDNISONE 10 MG (21) PO TBPK: ORAL_TABLET | ORAL | 0 refills | Status: DC

## 2018-07-10 NOTE — Discharge Instructions (Signed)
It was nice meeting you!!  We are treating you today for bronchitis As discussed if you are not feeling better with the steroids in the next couple of days please start the antibiotics. Continue your albuterol inhaler and nebulizer as needed Follow up as needed for continued or worsening symptoms

## 2018-07-10 NOTE — ED Provider Notes (Signed)
[facility]    CSN: [id number] Arrival date & time: 07/10/18  0920     History   Chief Complaint Chief Complaint  Patient presents with  . Asthma    HPI Evan Brock is a 67 y.o. male.   Patient is a 67 year old male with past medical history of COPD, asthma, hypertension.  He presents with 1 week of increasing shortness of breath, cough, wheezing.  His symptoms have progressed over the last 2 days and worsened.  He has not been getting any sleep the last couple nights.  He has been using his rescue inhaler and nebulizer machine with minimal relief of symptoms.  He denies any history of allergies.  He denies any fever, chills, body aches, congestion, ear pain, sore throat.   ROS per HPI      Past Medical History:  Diagnosis Date  . Arthritis    "hips" (10/01/2013)  . Asthma   . COPD (chronic obstructive pulmonary disease) (HCC)   . Exertional shortness of breath   . Hypertension     Patient Active Problem List   Diagnosis Date Noted  . Leukocytosis 10/04/2015  . COPD exacerbation (HCC) 10/01/2015  . Essential hypertension, benign 03/01/2015  . Asthma with acute exacerbation 01/26/2015  . Thrombocytopenia, unspecified (HCC) 11/06/2013  . Acute respiratory failure (HCC) with hypoxia 10/01/2013  . Acute renal failure (HCC) 10/01/2013    Past Surgical History:  Procedure Laterality Date  . JOINT REPLACEMENT    . TOTAL HIP ARTHROPLASTY Right 2010  . TOTAL HIP ARTHROPLASTY Left 08/19/2013   Procedure: TOTAL HIP ARTHROPLASTY ANTERIOR APPROACH;  Surgeon: Velna Ochs, MD;  Location: [facility] OR;  Service: Orthopedics;  Laterality: Left;       Home Medications    Prior to Admission medications   Medication Sig Start Date End Date Taking? Authorizing Provider  albuterol (PROVENTIL HFA;VENTOLIN HFA) 108 (90 Base) MCG/ACT inhaler 1-2 puffs q 6 hours x 3 days then prn SOB 06/03/18  Yes Loletta Specter, PA-C  cloNIDine (CATAPRES) 0.1 MG tablet Take 1  tablet (0.1 mg total) by mouth 2 (two) times daily. 02/22/18  Yes Loletta Specter, PA-C  ipratropium-albuterol (DUONEB) 0.5-2.5 (3) MG/3ML SOLN TAKE 3 MLS BY NEBULIZATION EVERY 6 HOURS AS NEEDED FOR SHORTNESS OF BREATH 06/03/18  Yes Loletta Specter, PA-C  acetaminophen-codeine (TYLENOL #3) 300-30 MG tablet Take 1 tablet by mouth every 8 (eight) hours as needed for moderate pain. 06/03/18   Loletta Specter, PA-C  amLODipine (NORVASC) 10 MG tablet Take 1 tablet (10 mg total) by mouth daily. 11/05/17   Loletta Specter, PA-C  atenolol (TENORMIN) 50 MG tablet Take 1 tablet (50 mg total) by mouth daily. 11/05/17   Loletta Specter, PA-C  azithromycin (ZITHROMAX) 250 MG tablet Take 1 tablet (250 mg total) by mouth daily. Take first 2 tablets together, then 1 every day until finished. 07/10/18   Hyland Mollenkopf, Gloris Manchester A, NP  fluticasone (FLOVENT HFA) 110 MCG/ACT inhaler Inhale 1 puff into the lungs 2 (two) times daily. 11/05/17   Loletta Specter, PA-C  losartan (COZAAR) 50 MG tablet Take 1 tablet (50 mg total) by mouth daily. 11/05/17   Loletta Specter, PA-C  montelukast (SINGULAIR) 10 MG tablet Take 1 tablet (10 mg total) by mouth at bedtime. 02/22/18   Loletta Specter, PA-C  naproxen (NAPROSYN) 500 MG tablet Take 1 tablet (500 mg total) by mouth 2 (two) times daily with a meal. 06/03/18   Loletta Specter,  PA-C  predniSONE (STERAPRED UNI-PAK 21 TAB) 10 MG (21) TBPK tablet 6 tabs for 1 day, then 5 tabs for 1 das, then 4 tabs for 1 day, then 3 tabs for 1 day, 2 tabs for 1 day, then 1 tab for 1 day 07/10/18   Dahlia Byes A, NP  terazosin (HYTRIN) 2 MG capsule Take 1 capsule (2 mg total) by mouth daily. 11/05/17   Loletta Specter, PA-C    Family History Family History  Problem Relation Age of Onset  . Diabetes Mother   . Hypertension Father   . Lupus Sister     Social History Social History   Tobacco Use  . Smoking status: Former Smoker    Packs/day: 0.00    Years: 0.00    Pack years: 0.00     Types: Cigarettes    Last attempt to quit: 10/23/1990    Years since quitting: 27.7  . Smokeless tobacco: Never Used  . Tobacco comment: 10/01/2013 "quit smoking cigarettes~ 15-20 yr ago"  Substance Use Topics  . Alcohol use: Yes    Alcohol/week: 12.0 standard drinks    Types: 12 Cans of beer per week    Comment: 08/19/2013 "3 times/wk; 3-4 beers/night"  . Drug use: Yes    Types: Marijuana    Comment: 10/01/2013 "last drug use was 10 yr ago"     Allergies   Patient has no known allergies.   Review of Systems Review of Systems   Physical Exam Triage Vital Signs ED Triage Vitals  Enc Vitals Group     BP 07/10/18 0937 128/73     Pulse Rate 07/10/18 0937 77     Resp 07/10/18 0937 (!) 24     Temp 07/10/18 0937 97.9 F (36.6 C)     Temp Source 07/10/18 0937 Oral     SpO2 07/10/18 0937 93 %     Weight --      Height --      Head Circumference --      Peak Flow --      Pain Score 07/10/18 0959 0     Pain Loc --      Pain Edu? --      Excl. in GC? --    No data found.  Updated Vital Signs BP 128/73 (BP Location: Right Arm)   Pulse 77   Temp 97.9 F (36.6 C) (Oral)   Resp (!) 24   SpO2 93%   Visual Acuity Right Eye Distance:   Left Eye Distance:   Bilateral Distance:    Right Eye Near:   Left Eye Near:    Bilateral Near:     Physical Exam  Constitutional: He is oriented to person, place, and time. He appears well-developed and well-nourished.  Very pleasant. Non toxic or ill appearing.     HENT:  Head: Normocephalic and atraumatic.  Mouth/Throat: Oropharynx is clear and moist.  Eyes: Conjunctivae are normal.  Neck: Normal range of motion.  Cardiovascular: Normal rate, regular rhythm and normal heart sounds.  Pulmonary/Chest: He has wheezes. He exhibits no tenderness.  Mild dyspnea. Course lung sound in all fields with inspiratory and expiratory wheezing.   Musculoskeletal: Normal range of motion.  Neurological: He is alert and oriented to person,  place, and time.  Skin: Skin is warm and dry.  Psychiatric: He has a normal mood and affect.  Nursing note and vitals reviewed.    UC Treatments / Results  Labs (all labs ordered are listed, but only  abnormal results are displayed) Labs Reviewed - No data to display  EKG None  Radiology No results found.  Procedures Procedures (including critical care time)  Medications Ordered in UC Medications  ipratropium-albuterol (DUONEB) 0.5-2.5 (3) MG/3ML nebulizer solution 3 mL (3 mLs Nebulization Given 07/10/18 1042)  methylPREDNISolone sodium succinate (SOLU-MEDROL) 125 mg/2 mL injection 80 mg (80 mg Intramuscular Given 07/10/18 1042)    Initial Impression / Assessment and Plan / UC Course  I have reviewed the triage vital signs and the nursing notes.  Pertinent labs & imaging results that were available during my care of the patient were reviewed by me and considered in my medical decision making (see chart for details).    Bronchitis-  DuoNeb and steroid injection in clinic for acute asthma exacerbation. Lung sounds improved after Duo Neb.  Still having some mild wheezing. Respirations equal and unlabored.  Dyspnea improved. Patient reports he feels better Safe to send home  Will send home with prednisone taper and Z-Pak. Instructed to start Z-Pak in a few days if symptoms do not improve. Keep using the albuterol inhaler and nebulizer as needed Patient understanding and agreeable to plan Final Clinical Impressions(s) / UC Diagnoses   Final diagnoses:  Bronchitis     Discharge Instructions     It was nice meeting you!!  We are treating you today for bronchitis As discussed if you are not feeling better with the steroids in the next couple of days please start the antibiotics. Continue your albuterol inhaler and nebulizer as needed Follow up as needed for continued or worsening symptoms     ED Prescriptions    Medication Sig Dispense Auth. Provider   predniSONE  (STERAPRED UNI-PAK 21 TAB) 10 MG (21) TBPK tablet 6 tabs for 1 day, then 5 tabs for 1 das, then 4 tabs for 1 day, then 3 tabs for 1 day, 2 tabs for 1 day, then 1 tab for 1 day 21 tablet Lyndon Chapel A, NP   azithromycin (ZITHROMAX) 250 MG tablet Take 1 tablet (250 mg total) by mouth daily. Take first 2 tablets together, then 1 every day until finished. 6 tablet Dahlia Byes A, NP     Controlled Substance Prescriptions [place] Controlled Substance Registry consulted? Not Applicable   Janace Aris, NP 07/10/18 1528

## 2018-07-10 NOTE — ED Triage Notes (Signed)
Pt reports a history of asthma.  For the last week he has been having SOB.  He has been using his rescue inhaler and his nebulizer with very little relief.

## 2018-07-28 LAB — FECAL OCCULT BLOOD, IMMUNOCHEMICAL: FECAL OCCULT BLD: NEGATIVE

## 2018-07-29 ENCOUNTER — Telehealth (INDEPENDENT_AMBULATORY_CARE_PROVIDER_SITE_OTHER): Payer: Self-pay

## 2018-07-29 NOTE — Telephone Encounter (Signed)
-----   Message from Loletta Specter, PA-C sent at 07/29/2018 12:55 PM EDT ----- Negative FIT.

## 2018-07-29 NOTE — Telephone Encounter (Signed)
Spoke with patients wife and informed that patients FIT is negative. She will inform patient. Maryjean Morn, CMA

## 2018-07-30 ENCOUNTER — Other Ambulatory Visit (INDEPENDENT_AMBULATORY_CARE_PROVIDER_SITE_OTHER): Payer: Self-pay | Admitting: Physician Assistant

## 2018-07-30 DIAGNOSIS — J4540 Moderate persistent asthma, uncomplicated: Secondary | ICD-10-CM

## 2018-07-30 NOTE — Telephone Encounter (Signed)
FWD to PCP. Tempestt S Roberts, CMA  

## 2018-08-05 ENCOUNTER — Other Ambulatory Visit: Payer: Self-pay

## 2018-08-05 ENCOUNTER — Ambulatory Visit (INDEPENDENT_AMBULATORY_CARE_PROVIDER_SITE_OTHER): Payer: Medicare Other | Admitting: Physician Assistant

## 2018-08-05 ENCOUNTER — Encounter (INDEPENDENT_AMBULATORY_CARE_PROVIDER_SITE_OTHER): Payer: Self-pay | Admitting: Physician Assistant

## 2018-08-05 VITALS — BP 121/84 | HR 66 | Temp 97.5°F | Ht 69.0 in | Wt 205.4 lb

## 2018-08-05 DIAGNOSIS — E119 Type 2 diabetes mellitus without complications: Secondary | ICD-10-CM

## 2018-08-05 DIAGNOSIS — Z23 Encounter for immunization: Secondary | ICD-10-CM | POA: Diagnosis not present

## 2018-08-05 DIAGNOSIS — J4540 Moderate persistent asthma, uncomplicated: Secondary | ICD-10-CM

## 2018-08-05 DIAGNOSIS — G8929 Other chronic pain: Secondary | ICD-10-CM

## 2018-08-05 DIAGNOSIS — M542 Cervicalgia: Secondary | ICD-10-CM | POA: Diagnosis not present

## 2018-08-05 MED ORDER — ALBUTEROL SULFATE HFA 108 (90 BASE) MCG/ACT IN AERS: 1.0000 | INHALATION_SPRAY | Freq: Four times a day (QID) | RESPIRATORY_TRACT | 5 refills | Status: DC | PRN

## 2018-08-05 MED ORDER — GABAPENTIN 300 MG PO CAPS: 300.0000 mg | ORAL_CAPSULE | Freq: Three times a day (TID) | ORAL | 3 refills | Status: DC

## 2018-08-05 MED ORDER — BUDESONIDE-FORMOTEROL FUMARATE 160-4.5 MCG/ACT IN AERO: 2.0000 | INHALATION_SPRAY | Freq: Two times a day (BID) | RESPIRATORY_TRACT | 5 refills | Status: DC

## 2018-08-05 MED ORDER — IPRATROPIUM-ALBUTEROL 0.5-2.5 (3) MG/3ML IN SOLN: 3.0000 mL | Freq: Four times a day (QID) | RESPIRATORY_TRACT | 3 refills | Status: DC | PRN

## 2018-08-05 NOTE — Patient Instructions (Signed)

## 2018-08-05 NOTE — Progress Notes (Signed)
Subjective:  Patient ID: Evan Brock, male    DOB: 06/11/1951  Age: 68 y.o. MRN: [id number]  CC: f/u neck pain  HPI Evan Brock a 67 y.o.malewith a medical history of arthritis, asthma, COPD, DM2, bilateral hip replacement, and hypertension presents to f/u on left sided neck pain. 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 Naproxen and Tylenol #3 were helpful in reducing pain. Has seen orthopedics but for chronic LBP. Cervicalgia was not addressed with ortho.     Patient requests refills for his albuterol inhaler and nebulizer. Not currently on maintenance therapy and using nebulizer three times per day.   Outpatient Medications Prior to Visit  Medication Sig Dispense Refill  . albuterol (PROVENTIL HFA;VENTOLIN HFA) 108 (90 Base) MCG/ACT inhaler 1-2 puffs q 6 hours x 3 days then prn SOB 1 Inhaler 2  . amLODipine (NORVASC) 10 MG tablet Take 1 tablet (10 mg total) by mouth daily. 90 tablet 3  . atenolol (TENORMIN) 50 MG tablet Take 1 tablet (50 mg total) by mouth daily. 90 tablet 3  . cloNIDine (CATAPRES) 0.1 MG tablet Take 1 tablet (0.1 mg total) by mouth 2 (two) times daily. 60 tablet 5  . fluticasone (FLOVENT HFA) 110 MCG/ACT inhaler Inhale 1 puff into the lungs 2 (two) times daily. 1 Inhaler 12  . ipratropium-albuterol (DUONEB) 0.5-2.5 (3) MG/3ML SOLN TAKE 3 MLS BY NEBULIZATION EVERY 6 HOURS AS NEEDED FOR SHORTNESS OF BREATH 180 mL 0  . losartan (COZAAR) 50 MG tablet Take 1 tablet (50 mg total) by mouth daily. 90 tablet 3  . montelukast (SINGULAIR) 10 MG tablet Take 1 tablet (10 mg total) by mouth at bedtime. 30 tablet 5  . terazosin (HYTRIN) 2 MG capsule Take 1 capsule (2 mg total) by mouth daily. 90 capsule 3  . acetaminophen-codeine (TYLENOL #3) 300-30 MG tablet Take 1 tablet by mouth every 8 (eight) hours as needed for moderate pain. 63 tablet 0  . azithromycin (ZITHROMAX) 250 MG tablet Take 1 tablet (250 mg total) by mouth daily. Take first 2  tablets together, then 1 every day until finished. 6 tablet 0  . naproxen (NAPROSYN) 500 MG tablet Take 1 tablet (500 mg total) by mouth 2 (two) times daily with a meal. 60 tablet 0  . predniSONE (STERAPRED UNI-PAK 21 TAB) 10 MG (21) TBPK tablet 6 tabs for 1 day, then 5 tabs for 1 das, then 4 tabs for 1 day, then 3 tabs for 1 day, 2 tabs for 1 day, then 1 tab for 1 day 21 tablet 0   No facility-administered medications prior to visit.      ROS Review of Systems  Constitutional: Negative for chills, fever and malaise/fatigue.  Eyes: Negative for blurred vision.  Respiratory: Negative for shortness of breath.   Cardiovascular: Negative for chest pain and palpitations.  Gastrointestinal: Negative for abdominal pain and nausea.  Genitourinary: Negative for dysuria and hematuria.  Musculoskeletal: Negative for joint pain and myalgias.  Skin: Negative for rash.  Neurological: Negative for tingling and headaches.  Psychiatric/Behavioral: Negative for depression. The patient is not nervous/anxious.     Objective:  BP 121/84 (BP Location: Left Arm, Patient Position: Sitting, Cuff Size: Normal)   Pulse 66   Temp (!) 97.5 F (36.4 C) (Oral)   Ht 5\' 9"  (1.753 m)   Wt 205 lb 6.4 oz (93.2 kg)   SpO2 94%   BMI 30.33 kg/m   BP/Weight 08/05/2018 07/10/2018 06/03/2018  Systolic BP  121 128 114  Diastolic BP 84 73 73  Wt. (Lbs) 205.4 - 206.8  BMI 30.33 - 30.54      Physical Exam  Constitutional: He is oriented to person, place, and time.  Well developed, well nourished, NAD, polite  HENT:  Head: Normocephalic and atraumatic.  Eyes: No scleral icterus.  Neck: Normal range of motion. Neck supple. No thyromegaly present.  Cardiovascular: Normal rate, regular rhythm and normal heart sounds.  Pulmonary/Chest: Effort normal and breath sounds normal.  Musculoskeletal: He exhibits no edema.  Neurological: He is alert and oriented to person, place, and time.  Skin: Skin is warm and dry. No rash  noted. No erythema. No pallor.  Psychiatric: He has a normal mood and affect. His behavior is normal. Thought content normal.  Vitals reviewed.    Assessment & Plan:    1. Chronic neck pain - Begin gabapentin (NEURONTIN) 300 MG capsule; Take 1 capsule (300 mg total) by mouth 3 (three) times daily.  Dispense: 90 capsule; Refill: 3  2. Moderate persistent asthma without complication - albuterol (VENTOLIN HFA) 108 (90 Base) MCG/ACT inhaler; Inhale 1 puff into the lungs every 6 (six) hours as needed for wheezing or shortness of breath.  Dispense: 1 Inhaler; Refill: 5 - ipratropium-albuterol (DUONEB) 0.5-2.5 (3) MG/3ML SOLN; Take 3 mLs by nebulization every 6 (six) hours as needed.  Dispense: 180 mL; Refill: 3 - budesonide-formoterol (SYMBICORT) 160-4.5 MCG/ACT inhaler; Inhale 2 puffs into the lungs 2 (two) times daily.  Dispense: 1 Inhaler; Refill: 5  3. Need for prophylactic vaccination and inoculation against influenza - Flu Vaccine QUAD 6+ mos PF IM (Fluarix Quad PF)  4. Type 2 diabetes mellitus without complication, without long-term current use of insulin (HCC) - Comprehensive metabolic panel - Lipid panel  *Pt asked about use of sildenafil for erectile dysfunction. I advised patient against use of erectile dysfunction medications as there is an interaction with his Terazosin which causes hypotension.     Meds ordered this encounter  Medications  . gabapentin (NEURONTIN) 300 MG capsule    Sig: Take 1 capsule (300 mg total) by mouth 3 (three) times daily.    Dispense:  90 capsule    Refill:  3    Order Specific Question:   Supervising Provider    Answer:   Hoy Register [[id number]]  . albuterol (VENTOLIN HFA) 108 (90 Base) MCG/ACT inhaler    Sig: Inhale 1 puff into the lungs every 6 (six) hours as needed for wheezing or shortness of breath.    Dispense:  1 Inhaler    Refill:  5    Order Specific Question:   Supervising Provider    Answer:   Hoy Register [[id number]]  .  ipratropium-albuterol (DUONEB) 0.5-2.5 (3) MG/3ML SOLN    Sig: Take 3 mLs by nebulization every 6 (six) hours as needed.    Dispense:  180 mL    Refill:  3    Order Specific Question:   Supervising Provider    Answer:   Hoy Register [[id number]]  . budesonide-formoterol (SYMBICORT) 160-4.5 MCG/ACT inhaler    Sig: Inhale 2 puffs into the lungs 2 (two) times daily.    Dispense:  1 Inhaler    Refill:  5    Order Specific Question:   Supervising Provider    Answer:   Hoy Register [[id number]]    Follow-up: Return in about 6 months (around 02/04/2019) for DM and Lipids.   Loletta Specter PA

## 2018-08-06 LAB — COMPREHENSIVE METABOLIC PANEL
ALBUMIN: 4 g/dL (ref 3.6–4.8)
ALK PHOS: 51 IU/L (ref 39–117)
ALT: 15 IU/L (ref 0–44)
AST: 12 IU/L (ref 0–40)
Albumin/Globulin Ratio: 1.5 (ref 1.2–2.2)
BILIRUBIN TOTAL: 0.5 mg/dL (ref 0.0–1.2)
BUN / CREAT RATIO: 12 (ref 10–24)
BUN: 14 mg/dL (ref 8–27)
CALCIUM: 9.3 mg/dL (ref 8.6–10.2)
CHLORIDE: 98 mmol/L (ref 96–106)
CO2: 27 mmol/L (ref 20–29)
CREATININE: 1.13 mg/dL (ref 0.76–1.27)
GFR calc Af Amer: 77 mL/min/{1.73_m2} (ref >59)
GFR calc non Af Amer: 66 mL/min/{1.73_m2} (ref >59)
GLUCOSE: 99 mg/dL (ref 65–99)
Globulin, Total: 2.6 g/dL (ref 1.5–4.5)
Potassium: 4.7 mmol/L (ref 3.5–5.2)
SODIUM: 139 mmol/L (ref 134–144)
TOTAL PROTEIN: 6.6 g/dL (ref 6.0–8.5)

## 2018-08-06 LAB — LIPID PANEL
CHOLESTEROL TOTAL: 179 mg/dL (ref 100–199)
Chol/HDL Ratio: 2.6 ratio (ref 0.0–5.0)
HDL: 70 mg/dL (ref >39)
LDL CALC: 94 mg/dL (ref 0–99)
TRIGLYCERIDES: 76 mg/dL (ref 0–149)
VLDL CHOLESTEROL CAL: 15 mg/dL (ref 5–40)

## 2018-08-07 ENCOUNTER — Other Ambulatory Visit (INDEPENDENT_AMBULATORY_CARE_PROVIDER_SITE_OTHER): Payer: Self-pay | Admitting: Physician Assistant

## 2018-08-07 ENCOUNTER — Telehealth (INDEPENDENT_AMBULATORY_CARE_PROVIDER_SITE_OTHER): Payer: Self-pay

## 2018-08-07 DIAGNOSIS — E119 Type 2 diabetes mellitus without complications: Secondary | ICD-10-CM

## 2018-08-07 MED ORDER — ATORVASTATIN CALCIUM 40 MG PO TABS: 40.0000 mg | ORAL_TABLET | Freq: Every day | ORAL | 3 refills | Status: DC

## 2018-08-07 NOTE — Telephone Encounter (Signed)
Patient was not available results provided to patients wife. She is aware that his cholesterol needs to be lower, atorvastatin has been sent to [facility] pharmacy. All other labs normal. Maryjean Morn, CMA

## 2018-08-07 NOTE — Telephone Encounter (Signed)
-----   Message from Loletta Specter, PA-C sent at 08/07/2018 10:52 AM EDT ----- Cholesterol needs to be lower. Rest of labs normal. I have sent atorvastatin to [facility].

## 2018-08-23 MED FILL — SYMBICORT 160-4.5 MCG INH: 160-4.5 | 30 days supply | Qty: 10 | Fill #0

## 2018-08-23 MED FILL — PROAIR HFA 90 MCG INHALER: 108 (90 BAS | 25 days supply | Qty: 9 | Fill #0

## 2018-08-23 MED FILL — GABAPENTIN 300 MG CAPSULE: 300 | 30 days supply | Qty: 90 | Fill #0

## 2018-08-23 MED FILL — IPRAT-ALBUT 0.5-3(2.5) MG/3: 0.5-2.5 (3) | 15 days supply | Qty: 180 | Fill #0

## 2018-10-03 MED FILL — GABAPENTIN 300 MG CAPSULE: 300 | 30 days supply | Qty: 90 | Fill #1

## 2018-10-03 MED FILL — PROAIR HFA 90 MCG INHALER: 108 (90 BAS | 25 days supply | Qty: 9 | Fill #1

## 2018-10-04 MED FILL — SYMBICORT 160-4.5 MCG INH: 160-4.5 | 30 days supply | Qty: 10 | Fill #1

## 2018-11-12 MED FILL — GABAPENTIN 300 MG CAPSULE: 300 | 30 days supply | Qty: 90 | Fill #2

## 2018-11-12 MED FILL — PROAIR HFA 90 MCG INHALER: 108 (90 BAS | 25 days supply | Qty: 9 | Fill #2

## 2018-11-12 MED FILL — SYMBICORT 160-4.5 MCG INH: 160-4.5 | 30 days supply | Qty: 10 | Fill #2

## 2018-11-18 ENCOUNTER — Other Ambulatory Visit: Payer: Self-pay | Admitting: Internal Medicine

## 2018-11-18 DIAGNOSIS — I10 Essential (primary) hypertension: Secondary | ICD-10-CM

## 2018-11-18 DIAGNOSIS — Z760 Encounter for issue of repeat prescription: Secondary | ICD-10-CM

## 2018-11-18 MED ORDER — CLONIDINE HCL 0.1 MG PO TABS: 0.1000 mg | ORAL_TABLET | Freq: Two times a day (BID) | ORAL | 2 refills | Status: DC

## 2018-11-18 MED ORDER — ATENOLOL 50 MG PO TABS: 50.0000 mg | ORAL_TABLET | Freq: Every day | ORAL | 0 refills | Status: DC

## 2018-12-27 MED FILL — GABAPENTIN 300 MG CAPSULE: 300 | 30 days supply | Qty: 90 | Fill #3

## 2018-12-27 MED FILL — PROAIR HFA 90 MCG INHALER: 108 (90 BAS | 25 days supply | Qty: 9 | Fill #3

## 2018-12-30 MED FILL — SYMBICORT 160-4.5 MCG INH: 160-4.5 | 30 days supply | Qty: 10 | Fill #3

## 2019-01-23 MED FILL — PROAIR HFA 90 MCG INHALER: 108 (90 BAS | 25 days supply | Qty: 9 | Fill #4

## 2019-01-23 MED FILL — SYMBICORT 160-4.5 MCG INH: 160-4.5 | 30 days supply | Qty: 10 | Fill #4

## 2019-01-23 MED FILL — IPRAT-ALBUT 0.5-3(2.5) MG/3: 0.5-2.5 (3) | 15 days supply | Qty: 180 | Fill #1

## 2019-02-03 ENCOUNTER — Other Ambulatory Visit: Payer: Self-pay

## 2019-02-03 ENCOUNTER — Ambulatory Visit (INDEPENDENT_AMBULATORY_CARE_PROVIDER_SITE_OTHER): Payer: Medicare Other | Admitting: Primary Care

## 2019-02-03 ENCOUNTER — Ambulatory Visit: Payer: Medicare Other | Attending: Physician Assistant | Admitting: Primary Care

## 2019-02-03 ENCOUNTER — Encounter: Payer: Self-pay | Admitting: Primary Care

## 2019-02-03 VITALS — BP 119/79 | HR 71 | Temp 98.4°F | Ht 69.0 in | Wt 226.6 lb

## 2019-02-03 DIAGNOSIS — E119 Type 2 diabetes mellitus without complications: Secondary | ICD-10-CM | POA: Diagnosis not present

## 2019-02-03 DIAGNOSIS — Z760 Encounter for issue of repeat prescription: Secondary | ICD-10-CM | POA: Diagnosis not present

## 2019-02-03 DIAGNOSIS — J4540 Moderate persistent asthma, uncomplicated: Secondary | ICD-10-CM

## 2019-02-03 DIAGNOSIS — E782 Mixed hyperlipidemia: Secondary | ICD-10-CM

## 2019-02-03 DIAGNOSIS — I10 Essential (primary) hypertension: Secondary | ICD-10-CM

## 2019-02-03 DIAGNOSIS — M542 Cervicalgia: Secondary | ICD-10-CM

## 2019-02-03 DIAGNOSIS — G8929 Other chronic pain: Secondary | ICD-10-CM

## 2019-02-03 DIAGNOSIS — Z87898 Personal history of other specified conditions: Secondary | ICD-10-CM

## 2019-02-03 DIAGNOSIS — E0800 Diabetes mellitus due to underlying condition with hyperosmolarity without nonketotic hyperglycemic-hyperosmolar coma (NKHHC): Secondary | ICD-10-CM

## 2019-02-03 LAB — POCT GLYCOSYLATED HEMOGLOBIN (HGB A1C): Hemoglobin A1C: 6.5 % — AB (ref 4.0–5.6)

## 2019-02-03 MED ORDER — FLUTICASONE PROPIONATE HFA 110 MCG/ACT IN AERO: 1.0000 | INHALATION_SPRAY | Freq: Two times a day (BID) | RESPIRATORY_TRACT | 1 refills | Status: DC

## 2019-02-03 MED ORDER — AMLODIPINE BESYLATE 10 MG PO TABS: 10.0000 mg | ORAL_TABLET | Freq: Every day | ORAL | 0 refills | Status: DC

## 2019-02-03 MED ORDER — MOMETASONE FURO-FORMOTEROL FUM 200-5 MCG/ACT IN AERO: 2.0000 | INHALATION_SPRAY | Freq: Two times a day (BID) | RESPIRATORY_TRACT | 5 refills | Status: DC

## 2019-02-03 MED ORDER — LOSARTAN POTASSIUM 50 MG PO TABS: 50.0000 mg | ORAL_TABLET | Freq: Every day | ORAL | 0 refills | Status: DC

## 2019-02-03 MED ORDER — METFORMIN HCL 500 MG PO TABS: 500.0000 mg | ORAL_TABLET | Freq: Two times a day (BID) | ORAL | 0 refills | Status: DC

## 2019-02-03 MED ORDER — GABAPENTIN 300 MG PO CAPS: 300.0000 mg | ORAL_CAPSULE | Freq: Three times a day (TID) | ORAL | 0 refills | Status: DC

## 2019-02-03 MED ORDER — ATENOLOL 50 MG PO TABS: 50.0000 mg | ORAL_TABLET | Freq: Every day | ORAL | 0 refills | Status: DC

## 2019-02-03 MED ORDER — TERAZOSIN HCL 2 MG PO CAPS: 2.0000 mg | ORAL_CAPSULE | Freq: Every day | ORAL | 0 refills | Status: DC

## 2019-02-03 MED ORDER — ATORVASTATIN CALCIUM 40 MG PO TABS: 40.0000 mg | ORAL_TABLET | Freq: Every day | ORAL | 0 refills | Status: DC

## 2019-02-03 MED ORDER — CLONIDINE HCL 0.1 MG PO TABS: 0.1000 mg | ORAL_TABLET | Freq: Two times a day (BID) | ORAL | 3 refills | Status: DC

## 2019-02-03 MED FILL — DULERA 200 MCG/5 MCG INH: 200-5 | 30 days supply | Qty: 13 | Fill #0

## 2019-02-03 MED FILL — AMLODIPINE BESYLATE 10 MG T: 10 | 90 days supply | Qty: 90 | Fill #0

## 2019-02-03 MED FILL — TERAZOSIN 2 MG CAPSULE: 2 | 90 days supply | Qty: 90 | Fill #0

## 2019-02-03 MED FILL — ATORVASTATIN CALCIUM 40 MG: 40 | 90 days supply | Qty: 90 | Fill #0

## 2019-02-03 MED FILL — LOSARTAN POTASSIUM 50 MG TA: 50 | 90 days supply | Qty: 90 | Fill #0

## 2019-02-03 MED FILL — metFORMIN HCL 500 MG TABS: 500 | 90 days supply | Qty: 180 | Fill #0

## 2019-02-03 MED FILL — ATENOLOL 50 MG TABLET: 50 | 90 days supply | Qty: 90 | Fill #0

## 2019-02-03 MED FILL — GABAPENTIN 300 MG CAPSULE: 300 | 30 days supply | Qty: 90 | Fill #0

## 2019-02-03 NOTE — Progress Notes (Signed)
Check A1c today was 6.5   Medication refills: Albuterol and Gabapentin  with [facility], Clonidine with [facility]  Labs= Lipid

## 2019-02-03 NOTE — Progress Notes (Signed)
Established Patient Office Visit  Subjective:  Patient ID: Evan Brock, male    DOB: [birth year]  Age: 68 y.o. MRN: [id number]  CC:  Chief Complaint  Patient presents with   Medication Refill   Diabetes    Foot exam and A1c   Labs Only    Lipids    HPI Evan Brock presents for management of prediabetes which numbers have increased to A1C 6.5  Past Medical History:  Diagnosis Date   Arthritis    "hips" (10/01/2013)   Asthma    COPD (chronic obstructive pulmonary disease) (HCC)    Exertional shortness of breath    Hypertension     Past Surgical History:  Procedure Laterality Date   JOINT REPLACEMENT     TOTAL HIP ARTHROPLASTY Right 2010   TOTAL HIP ARTHROPLASTY Left 08/19/2013   Procedure: TOTAL HIP ARTHROPLASTY ANTERIOR APPROACH;  Surgeon: Velna Ochs, MD;  Location: [facility] OR;  Service: Orthopedics;  Laterality: Left;    Family History  Problem Relation Age of Onset   Diabetes Mother    Hypertension Father    Lupus Sister     Social History   Socioeconomic History   Marital status: Single    Spouse name: Not on file   Number of children: Not on file   Years of education: Not on file   Highest education level: Not on file  Occupational History   Not on file  Social Needs   Financial resource strain: Not on file   Food insecurity:    Worry: Not on file    Inability: Not on file   Transportation needs:    Medical: Not on file    Non-medical: Not on file  Tobacco Use   Smoking status: Former Smoker    Packs/day: 0.00    Years: 0.00    Pack years: 0.00    Types: Cigarettes    Last attempt to quit: 10/23/1990    Years since quitting: 28.3   Smokeless tobacco: Never Used   Tobacco comment: 10/01/2013 "quit smoking cigarettes~ 15-20 yr ago"  Substance and Sexual Activity   Alcohol use: Yes    Alcohol/week: 12.0 standard drinks    Types: 12 Cans of beer per week    Comment: 08/19/2013 "3 times/wk; 3-4 beers/night"   Drug  use: Yes    Types: Marijuana    Comment: 10/01/2013 "last drug use was 10 yr ago"   Sexual activity: Not Currently  Lifestyle   Physical activity:    Days per week: Not on file    Minutes per session: Not on file   Stress: Not on file  Relationships   Social connections:    Talks on phone: Not on file    Gets together: Not on file    Attends religious service: Not on file    Active member of club or organization: Not on file    Attends meetings of clubs or organizations: Not on file    Relationship status: Not on file   Intimate partner violence:    Fear of current or ex partner: Not on file    Emotionally abused: Not on file    Physically abused: Not on file    Forced sexual activity: Not on file  Other Topics Concern   Not on file  Social History Narrative   Not on file    Outpatient Medications Prior to Visit  Medication Sig Dispense Refill   albuterol (VENTOLIN HFA) 108 (90 Base) MCG/ACT inhaler Inhale 1 puff  into the lungs every 6 (six) hours as needed for wheezing or shortness of breath. 1 Inhaler 5   ipratropium-albuterol (DUONEB) 0.5-2.5 (3) MG/3ML SOLN Take 3 mLs by nebulization every 6 (six) hours as needed. 180 mL 3   montelukast (SINGULAIR) 10 MG tablet Take 1 tablet (10 mg total) by mouth at bedtime. 30 tablet 5   amLODipine (NORVASC) 10 MG tablet Take 1 tablet (10 mg total) by mouth daily. 90 tablet 3   atenolol (TENORMIN) 50 MG tablet Take 1 tablet (50 mg total) by mouth daily. 90 tablet 0   atorvastatin (LIPITOR) 40 MG tablet Take 1 tablet (40 mg total) by mouth daily. 90 tablet 3   budesonide-formoterol (SYMBICORT) 160-4.5 MCG/ACT inhaler Inhale 2 puffs into the lungs 2 (two) times daily. 1 Inhaler 5   cloNIDine (CATAPRES) 0.1 MG tablet Take 1 tablet (0.1 mg total) by mouth 2 (two) times daily. 60 tablet 2   fluticasone (FLOVENT HFA) 110 MCG/ACT inhaler Inhale 1 puff into the lungs 2 (two) times daily. 1 Inhaler 12   gabapentin (NEURONTIN) 300  MG capsule Take 1 capsule (300 mg total) by mouth 3 (three) times daily. 90 capsule 3   losartan (COZAAR) 50 MG tablet Take 1 tablet (50 mg total) by mouth daily. 90 tablet 3   terazosin (HYTRIN) 2 MG capsule Take 1 capsule (2 mg total) by mouth daily. 90 capsule 3   No facility-administered medications prior to visit.     No Known Allergies  ROS Review of Systems  Constitutional: Negative.  Negative for weight loss.  HENT: Negative.   Eyes: Negative.   Respiratory: Negative.   Endocrine: Negative.   Genitourinary: Positive for frequency.  Allergic/Immunologic: Negative.   Neurological: Positive for vertigo. Negative for headaches.  Hematological: Negative.   Psychiatric/Behavioral: Negative.       Objective:    Physical Exam  Constitutional: He is oriented to person, place, and time. He appears well-developed and well-nourished.  HENT:  Head: Normocephalic and atraumatic.  Eyes: EOM are normal.  Neck: Normal range of motion. Neck supple.  Cardiovascular: Normal rate and regular rhythm.  Pulmonary/Chest: Breath sounds normal.  Abdominal: Soft. Bowel sounds are normal. He exhibits distension.  Musculoskeletal: Normal range of motion.  Neurological: He is alert and oriented to person, place, and time.  Skin: Skin is warm and dry.  Sensory exam of the foot is normal, tested with the monofilament. Good pulses, no lesions or ulcers, good peripheral pulses.  BP 119/79 (BP Location: Right Arm, Patient Position: Sitting, Cuff Size: Large)    Pulse 71    Temp 98.4 F (36.9 C) (Oral)    Ht  (1.753 m)    Wt 226 lb 9.6 oz (102.8 kg)    SpO2 90%    BMI 33.46 kg/m  Wt Readings from Last 3 Encounters:  02/03/19 226 lb 9.6 oz (102.8 kg)  08/05/18 205 lb 6.4 oz (93.2 kg)  06/03/18 206 lb 12.8 oz (93.8 kg)     Health Maintenance Due  Topic Date Due   Fecal DNA (Cologuard)  04/17/2000   OPHTHALMOLOGY EXAM  06/22/2018   FOOT EXAM  12/03/2018   HEMOGLOBIN A1C  12/04/2018      There are no preventive care reminders to display for this patient.  Lab Results  Component Value Date   TSH 2.100 11/05/2017   Lab Results  Component Value Date   WBC 10.7 11/05/2017   HGB 15.4 11/05/2017   HCT 45.7 11/05/2017  MCV 80 11/05/2017   PLT 303 11/05/2017   Lab Results  Component Value Date   NA 139 08/05/2018   K 4.7 08/05/2018   CO2 27 08/05/2018   GLUCOSE 99 08/05/2018   BUN 14 08/05/2018   CREATININE 1.13 08/05/2018   BILITOT 0.5 08/05/2018   ALKPHOS 51 08/05/2018   AST 12 08/05/2018   ALT 15 08/05/2018   PROT 6.6 08/05/2018   ALBUMIN 4.0 08/05/2018   CALCIUM 9.3 08/05/2018   ANIONGAP 9 10/05/2015   Lab Results  Component Value Date   CHOL 179 08/05/2018   Lab Results  Component Value Date   HDL 70 08/05/2018   Lab Results  Component Value Date   LDLCALC 94 08/05/2018   Lab Results  Component Value Date   TRIG 76 08/05/2018   Lab Results  Component Value Date   CHOLHDL 2.6 08/05/2018   Lab Results  Component Value Date   HGBA1C 6.5 (A) 02/03/2019   Evan Brock was seen today for medication refill, diabetes and labs only.  Diagnoses and all orders for this visit:  Medication refill -     cloNIDine (CATAPRES) 0.1 MG tablet; Take 1 tablet (0.1 mg total) by mouth 2 (two) times daily.  Diabetes This is a chronic problem. The current episode started more than 1 year ago. The problem occurs rarely. Associated symptoms include vertigo. Pertinent negatives include no headaches. Nothing aggravates the symptoms. He has tried nothing for the symptoms. The treatment provided significant relief.   He presents for his follow-up diabetic visit. He has type 2 diabetes mellitus. No MedicAlert identification noted. The initial diagnosis of diabetes was made 365 days ago. His disease course has been stable. There are no hypoglycemic associated symptoms. Pertinent negatives for hypoglycemia include no headaches. There are no diabetic associated symptoms.  Pertinent negatives for diabetes include no weight loss. There are no hypoglycemic complications. Symptoms are worsening. Diabetic complications include heart disease. Risk factors for coronary artery disease include diabetes mellitus, hypertension, male sex, dyslipidemia, sedentary lifestyle and family history. Current diabetic treatment includes diet and oral agent (monotherapy). He is compliant with treatment none of the time. His weight is fluctuating dramatically. He is following a generally unhealthy diet. When asked about meal planning, he reported none. He has not had a previous visit with a dietitian. He rarely participates in exercise. His home blood glucose trend is increasing steadily. An ACE inhibitor/angiotensin II receptor blocker is being taken. He does not see a podiatrist.Eye exam is not current.   Essential hypertension  Unremarkable -     amLODipine (NORVASC) 10 MG tablet; Take 1 tablet (10 mg total) by mouth daily. -     atenolol (TENORMIN) 50 MG tablet; Take 1 tablet (50 mg total) by mouth daily. -     losartan (COZAAR) 50 MG tablet; Take 1 tablet (50 mg total) by mouth daily. -     terazosin (HYTRIN) 2 MG capsule; Take 1 capsule (2 mg total) by mouth daily.  Moderate persistent asthma without complication -     mometasone-formoterol (DULERA) 200-5 MCG/ACT AERO; Inhale 2 puffs into the lungs 2 (two) times daily. -     fluticasone (FLOVENT HFA) 110 MCG/ACT inhaler; Inhale 1 puff into the lungs 2 (two) times daily.  Chronic neck pain -     gabapentin (NEURONTIN) 300 MG capsule; Take 1 capsule (300 mg total) by mouth 3 (three) times daily.     Assessment & Plan:   Problem List Items Addressed This Visit  None    Visit Diagnoses    Medication refill    -  Primary   Relevant Medications   cloNIDine (CATAPRES) 0.1 MG tablet   History of prediabetes       Relevant Medications   metFORMIN (GLUCOPHAGE) 500 MG tablet   Other Relevant Orders   HgB A1c (Completed)   HM  Diabetes Foot Exam (Completed)   Diabetes mellitus due to underlying condition with hyperosmolarity without coma, without long-term current use of insulin (HCC)       Relevant Medications   atorvastatin (LIPITOR) 40 MG tablet   losartan (COZAAR) 50 MG tablet   metFORMIN (GLUCOPHAGE) 500 MG tablet   Essential hypertension       Relevant Medications   amLODipine (NORVASC) 10 MG tablet   atenolol (TENORMIN) 50 MG tablet   atorvastatin (LIPITOR) 40 MG tablet   cloNIDine (CATAPRES) 0.1 MG tablet   losartan (COZAAR) 50 MG tablet   terazosin (HYTRIN) 2 MG capsule   Mixed hyperlipidemia       Relevant Medications   amLODipine (NORVASC) 10 MG tablet   atenolol (TENORMIN) 50 MG tablet   atorvastatin (LIPITOR) 40 MG tablet   cloNIDine (CATAPRES) 0.1 MG tablet   losartan (COZAAR) 50 MG tablet   terazosin (HYTRIN) 2 MG capsule   Type 2 diabetes mellitus without complication, without long-term current use of insulin (HCC)       Relevant Medications   atorvastatin (LIPITOR) 40 MG tablet   losartan (COZAAR) 50 MG tablet   metFORMIN (GLUCOPHAGE) 500 MG tablet   Moderate persistent asthma without complication       Relevant Medications   mometasone-formoterol (DULERA) 200-5 MCG/ACT AERO   fluticasone (FLOVENT HFA) 110 MCG/ACT inhaler   Chronic neck pain       Relevant Medications   gabapentin (NEURONTIN) 300 MG capsule     Evan Brock was seen today for medication refill, diabetes and labs only.  Diagnoses and all orders for this visit:  Medication refill -     cloNIDine (CATAPRES) 0.1 MG tablet; Take 1 tablet (0.1 mg total) by mouth 2 (two) times daily.  Mixed hyperlipidemia -     atorvastatin (LIPITOR) 40 MG tablet; Take 1 tablet (40 mg total) by mouth daily. Moderate persistent asthma without complication -     mometasone-formoterol (DULERA) 200-5 MCG/ACT AERO; Inhale 2 puffs into the lungs 2 (two) times daily. -     fluticasone (FLOVENT HFA) 110 MCG/ACT inhaler; Inhale 1 puff into the lungs  2 (two) times daily.  Chronic neck pain -     gabapentin (NEURONTIN) 300 MG capsule; Take 1 capsule (300 mg total) by mouth 3 (three) times daily.  Evan Brock was seen today for medication refill, diabetes and labs only.  Diagnoses and all orders for this visit:  Medication refill -     cloNIDine (CATAPRES) 0.1 MG tablet; Take 1 tablet (0.1 mg total) by mouth 2 (two) times daily.  History of prediabetes -     HgB A1c -     HM Diabetes Foot Exam -     metFORMIN (GLUCOPHAGE) 500 MG tablet; Take 1 tablet (500 mg total) by mouth 2 (two) times daily with a meal.  Diabetes mellitus due to underlying condition with hyperosmolarity without coma, without long-term current use of insulin (HCC) -     metFORMIN (GLUCOPHAGE) 500 MG tablet; Take 1 tablet (500 mg total) by mouth 2 (two) times daily with a meal.  Essential hypertension -  amLODipine (NORVASC) 10 MG tablet; Take 1 tablet (10 mg total) by mouth daily. -     atenolol (TENORMIN) 50 MG tablet; Take 1 tablet (50 mg total) by mouth daily. -     losartan (COZAAR) 50 MG tablet; Take 1 tablet (50 mg total) by mouth daily. -     terazosin (HYTRIN) 2 MG capsule; Take 1 capsule (2 mg total) by mouth daily.  Mixed hyperlipidemia -     atorvastatin (LIPITOR) 40 MG tablet; Take 1 tablet (40 mg total) by mouth daily.  Type 2 diabetes mellitus without complication, without long-term current use of insulin (HCC)  -     atorvastatin (LIPITOR) 40 MG tablet; Take 1 tablet (40 mg total) by mouth daily.  Moderate persistent asthma without complication -     mometasone-formoterol (DULERA) 200-5 MCG/ACT AERO; Inhale 2 puffs into the lungs 2 (two) times daily. -     fluticasone (FLOVENT HFA) 110 MCG/ACT inhaler; Inhale 1 puff into the lungs 2 (two) times daily.  Chronic neck pain -     gabapentin (NEURONTIN) 300 MG capsule; Take 1 capsule (300 mg total) by mouth 3 (three) times daily.   Meds ordered this encounter  Medications   amLODipine (NORVASC)  10 MG tablet    Sig: Take 1 tablet (10 mg total) by mouth daily.    Dispense:  90 tablet    Refill:  0   atenolol (TENORMIN) 50 MG tablet    Sig: Take 1 tablet (50 mg total) by mouth daily.    Dispense:  90 tablet    Refill:  0   atorvastatin (LIPITOR) 40 MG tablet    Sig: Take 1 tablet (40 mg total) by mouth daily.    Dispense:  90 tablet    Refill:  0   mometasone-formoterol (DULERA) 200-5 MCG/ACT AERO    Sig: Inhale 2 puffs into the lungs 2 (two) times daily.    Dispense:  1 Inhaler    Refill:  5   cloNIDine (CATAPRES) 0.1 MG tablet    Sig: Take 1 tablet (0.1 mg total) by mouth 2 (two) times daily.    Dispense:  60 tablet    Refill:  3   fluticasone (FLOVENT HFA) 110 MCG/ACT inhaler    Sig: Inhale 1 puff into the lungs 2 (two) times daily.    Dispense:  1 Inhaler    Refill:  1   gabapentin (NEURONTIN) 300 MG capsule    Sig: Take 1 capsule (300 mg total) by mouth 3 (three) times daily.    Dispense:  90 capsule    Refill:  0   losartan (COZAAR) 50 MG tablet    Sig: Take 1 tablet (50 mg total) by mouth daily.    Dispense:  90 tablet    Refill:  0   terazosin (HYTRIN) 2 MG capsule    Sig: Take 1 capsule (2 mg total) by mouth daily.    Dispense:  90 capsule    Refill:  0   metFORMIN (GLUCOPHAGE) 500 MG tablet    Sig: Take 1 tablet (500 mg total) by mouth 2 (two) times daily with a meal.    Dispense:  180 tablet    Refill:  0     Follow-up: Return in about 3 months (around 05/05/2019) for HTN.    Grayce Sessions, NP

## 2019-02-10 ENCOUNTER — Other Ambulatory Visit: Payer: Self-pay | Admitting: Internal Medicine

## 2019-02-10 DIAGNOSIS — I10 Essential (primary) hypertension: Secondary | ICD-10-CM

## 2019-03-05 MED FILL — PROAIR HFA 90 MCG INHALER: 108 (90 BAS | 25 days supply | Qty: 9 | Fill #5

## 2019-03-05 MED FILL — SYMBICORT 160-4.5 MCG INH: 160-4.5 | 30 days supply | Qty: 10 | Fill #5

## 2019-03-05 MED FILL — DULERA 200 MCG/5 MCG INH: 200-5 | 30 days supply | Qty: 13 | Fill #1

## 2019-05-05 ENCOUNTER — Ambulatory Visit (INDEPENDENT_AMBULATORY_CARE_PROVIDER_SITE_OTHER): Payer: Medicare Other | Admitting: Primary Care

## 2019-05-05 ENCOUNTER — Encounter (INDEPENDENT_AMBULATORY_CARE_PROVIDER_SITE_OTHER): Payer: Self-pay | Admitting: Primary Care

## 2019-05-05 ENCOUNTER — Other Ambulatory Visit: Payer: Self-pay

## 2019-05-05 VITALS — BP 111/74 | HR 69 | Temp 97.7°F | Ht 69.0 in | Wt 208.8 lb

## 2019-05-05 DIAGNOSIS — Z6839 Body mass index (BMI) 39.0-39.9, adult: Secondary | ICD-10-CM

## 2019-05-05 DIAGNOSIS — G8929 Other chronic pain: Secondary | ICD-10-CM

## 2019-05-05 DIAGNOSIS — Z760 Encounter for issue of repeat prescription: Secondary | ICD-10-CM

## 2019-05-05 DIAGNOSIS — I10 Essential (primary) hypertension: Secondary | ICD-10-CM | POA: Diagnosis not present

## 2019-05-05 DIAGNOSIS — E119 Type 2 diabetes mellitus without complications: Secondary | ICD-10-CM

## 2019-05-05 DIAGNOSIS — M542 Cervicalgia: Secondary | ICD-10-CM

## 2019-05-05 DIAGNOSIS — R351 Nocturia: Secondary | ICD-10-CM

## 2019-05-05 DIAGNOSIS — J4540 Moderate persistent asthma, uncomplicated: Secondary | ICD-10-CM

## 2019-05-05 DIAGNOSIS — Z87898 Personal history of other specified conditions: Secondary | ICD-10-CM

## 2019-05-05 DIAGNOSIS — J449 Chronic obstructive pulmonary disease, unspecified: Secondary | ICD-10-CM

## 2019-05-05 DIAGNOSIS — E782 Mixed hyperlipidemia: Secondary | ICD-10-CM

## 2019-05-05 DIAGNOSIS — E0800 Diabetes mellitus due to underlying condition with hyperosmolarity without nonketotic hyperglycemic-hyperosmolar coma (NKHHC): Secondary | ICD-10-CM

## 2019-05-05 LAB — GLUCOSE, POCT (MANUAL RESULT ENTRY): POC Glucose: 114 mg/dl — AB (ref 70–99)

## 2019-05-05 MED ORDER — ALBUTEROL SULFATE HFA 108 (90 BASE) MCG/ACT IN AERS: 1.0000 | INHALATION_SPRAY | Freq: Four times a day (QID) | RESPIRATORY_TRACT | 2 refills | Status: DC | PRN

## 2019-05-05 MED ORDER — LOSARTAN POTASSIUM 50 MG PO TABS: 50.0000 mg | ORAL_TABLET | Freq: Every day | ORAL | 0 refills | Status: DC

## 2019-05-05 MED ORDER — ATORVASTATIN CALCIUM 40 MG PO TABS: 40.0000 mg | ORAL_TABLET | Freq: Every day | ORAL | 0 refills | Status: DC

## 2019-05-05 MED ORDER — SYMBICORT 160-4.5 MCG/ACT IN AERO: 2.0000 | INHALATION_SPRAY | Freq: Two times a day (BID) | RESPIRATORY_TRACT | 3 refills | Status: DC

## 2019-05-05 MED ORDER — ATENOLOL 50 MG PO TABS: 50.0000 mg | ORAL_TABLET | Freq: Every day | ORAL | 0 refills | Status: DC

## 2019-05-05 MED ORDER — METFORMIN HCL 500 MG PO TABS: 500.0000 mg | ORAL_TABLET | Freq: Two times a day (BID) | ORAL | 0 refills | Status: DC

## 2019-05-05 MED ORDER — CLONIDINE HCL 0.1 MG PO TABS: 0.1000 mg | ORAL_TABLET | Freq: Two times a day (BID) | ORAL | 3 refills | Status: DC

## 2019-05-05 NOTE — Progress Notes (Signed)
Established Patient Office Visit  Subjective:  Patient ID: Evan Brock, male    DOB: [birth year]  Age: 68 y.o. MRN: [id number]  CC:  Chief Complaint  Patient presents with  . Follow-up    HTN    HPI Evan Brock presents for follow up on hypertension- denies shortness of breath, except with exertion  headaches, chest pain or lower extremity edema. Diabetes control A1C 6.5. and glucose today 114.  Past Medical History:  Diagnosis Date  . Arthritis    "hips" (10/01/2013)  . Asthma   . COPD (chronic obstructive pulmonary disease) (HCC)   . Exertional shortness of breath   . Hypertension     Past Surgical History:  Procedure Laterality Date  . JOINT REPLACEMENT    . TOTAL HIP ARTHROPLASTY Right 2010  . TOTAL HIP ARTHROPLASTY Left 08/19/2013   Procedure: TOTAL HIP ARTHROPLASTY ANTERIOR APPROACH;  Surgeon: Velna Ochs, MD;  Location: [facility] OR;  Service: Orthopedics;  Laterality: Left;    Family History  Problem Relation Age of Onset  . Diabetes Mother   . Hypertension Father   . Lupus Sister     Social History   Socioeconomic History  . Marital status: Single    Spouse name: Not on file  . Number of children: Not on file  . Years of education: Not on file  . Highest education level: Not on file  Occupational History  . Not on file  Social Needs  . Financial resource strain: Not on file  . Food insecurity    Worry: Not on file    Inability: Not on file  . Transportation needs    Medical: Not on file    Non-medical: Not on file  Tobacco Use  . Smoking status: Former Smoker    Packs/day: 0.00    Years: 0.00    Pack years: 0.00    Types: Cigarettes    Quit date: 10/23/1990    Years since quitting: 28.5  . Smokeless tobacco: Never Used  . Tobacco comment: 10/01/2013 "quit smoking cigarettes~ 15-20 yr ago"  Substance and Sexual Activity  . Alcohol use: Yes    Alcohol/week: 12.0 standard drinks    Types: 12 Cans of beer per week    Comment: 08/19/2013 "3  times/wk; 3-4 beers/night"  . Drug use: Yes    Types: Marijuana    Comment: 10/01/2013 "last drug use was 10 yr ago"  . Sexual activity: Not Currently  Lifestyle  . Physical activity    Days per week: Not on file    Minutes per session: Not on file  . Stress: Not on file  Relationships  . Social Musician on phone: Not on file    Gets together: Not on file    Attends religious service: Not on file    Active member of club or organization: Not on file    Attends meetings of clubs or organizations: Not on file    Relationship status: Not on file  . Intimate partner violence    Fear of current or ex partner: Not on file    Emotionally abused: Not on file    Physically abused: Not on file    Forced sexual activity: Not on file  Other Topics Concern  . Not on file  Social History Narrative  . Not on file    Outpatient Medications Prior to Visit  Medication Sig Dispense Refill  . amLODipine (NORVASC) 10 MG tablet Take 1 tablet (10 mg total)  by mouth daily. 90 tablet 0  . ipratropium-albuterol (DUONEB) 0.5-2.5 (3) MG/3ML SOLN Take 3 mLs by nebulization every 6 (six) hours as needed. 180 mL 3  . terazosin (HYTRIN) 2 MG capsule Take 1 capsule (2 mg total) by mouth daily. 90 capsule 0  . albuterol (VENTOLIN HFA) 108 (90 Base) MCG/ACT inhaler Inhale 1 puff into the lungs every 6 (six) hours as needed for wheezing or shortness of breath. 1 Inhaler 5  . atenolol (TENORMIN) 50 MG tablet Take 1 tablet (50 mg total) by mouth daily. 90 tablet 0  . atorvastatin (LIPITOR) 40 MG tablet Take 1 tablet (40 mg total) by mouth daily. 90 tablet 0  . cloNIDine (CATAPRES) 0.1 MG tablet Take 1 tablet (0.1 mg total) by mouth 2 (two) times daily. 60 tablet 3  . losartan (COZAAR) 50 MG tablet Take 1 tablet (50 mg total) by mouth daily. 90 tablet 0  . metFORMIN (GLUCOPHAGE) 500 MG tablet Take 1 tablet (500 mg total) by mouth 2 (two) times daily with a meal. 180 tablet 0  . fluticasone (FLOVENT HFA)  110 MCG/ACT inhaler Inhale 1 puff into the lungs 2 (two) times daily. (Patient not taking: Reported on 05/05/2019) 1 Inhaler 1  . gabapentin (NEURONTIN) 300 MG capsule Take 1 capsule (300 mg total) by mouth 3 (three) times daily. (Patient not taking: Reported on 05/05/2019) 90 capsule 0  . mometasone-formoterol (DULERA) 200-5 MCG/ACT AERO Inhale 2 puffs into the lungs 2 (two) times daily. (Patient not taking: Reported on 05/05/2019) 1 Inhaler 5  . montelukast (SINGULAIR) 10 MG tablet Take 1 tablet (10 mg total) by mouth at bedtime. (Patient not taking: Reported on 05/05/2019) 30 tablet 5  . SYMBICORT 160-4.5 MCG/ACT inhaler      No facility-administered medications prior to visit.     No Known Allergies  ROS Review of Systems  Respiratory: Positive for shortness of breath.        With exertion history of COPD   Genitourinary: Positive for frequency.  Musculoskeletal: Positive for neck pain.  All other systems reviewed and are negative.     Objective:    Physical Exam  Constitutional: He is oriented to person, place, and time. He appears well-developed and well-nourished.  HENT:  Head: Normocephalic.  Eyes: EOM are normal.  Neck: Neck supple.  Cardiovascular: Normal rate and regular rhythm.  Pulmonary/Chest: Effort normal and breath sounds normal.  Abdominal: Soft. Bowel sounds are normal. He exhibits distension.  Musculoskeletal:     Comments: stiffness  Neurological: He is oriented to person, place, and time.  Skin: Skin is warm.  Psychiatric: He has a normal mood and affect.    BP 111/74 (BP Location: Left Arm, Patient Position: Sitting, Cuff Size: Large)   Pulse 69   Temp 97.7 F (36.5 C) (Tympanic)   Ht 5\' 9"  (1.753 m)   Wt 208 lb 12.8 oz (94.7 kg)   SpO2 94%   BMI 30.83 kg/m  Wt Readings from Last 3 Encounters:  05/05/19 208 lb 12.8 oz (94.7 kg)  02/03/19 226 lb 9.6 oz (102.8 kg)  08/05/18 205 lb 6.4 oz (93.2 kg)     Health Maintenance Due  Topic Date Due  .  OPHTHALMOLOGY EXAM  06/22/2018    There are no preventive care reminders to display for this patient.  Lab Results  Component Value Date   TSH 2.100 11/05/2017   Lab Results  Component Value Date   WBC 10.7 11/05/2017   HGB 15.4 11/05/2017  HCT 45.7 11/05/2017   MCV 80 11/05/2017   PLT 303 11/05/2017   Lab Results  Component Value Date   NA 139 08/05/2018   K 4.7 08/05/2018   CO2 27 08/05/2018   GLUCOSE 99 08/05/2018   BUN 14 08/05/2018   CREATININE 1.13 08/05/2018   BILITOT 0.5 08/05/2018   ALKPHOS 51 08/05/2018   AST 12 08/05/2018   ALT 15 08/05/2018   PROT 6.6 08/05/2018   ALBUMIN 4.0 08/05/2018   CALCIUM 9.3 08/05/2018   ANIONGAP 9 10/05/2015   Lab Results  Component Value Date   CHOL 179 08/05/2018   Lab Results  Component Value Date   HDL 70 08/05/2018   Lab Results  Component Value Date   LDLCALC 94 08/05/2018   Lab Results  Component Value Date   TRIG 76 08/05/2018   Lab Results  Component Value Date   CHOLHDL 2.6 08/05/2018   Lab Results  Component Value Date   HGBA1C 6.5 (A) 02/03/2019      Assessment & Plan:   Problem List Items Addressed This Visit    Essential hypertension, benign   Relevant Medications   atenolol (TENORMIN) 50 MG tablet   losartan (COZAAR) 50 MG tablet   atorvastatin (LIPITOR) 40 MG tablet   cloNIDine (CATAPRES) 0.1 MG tablet    Other Visit Diagnoses    Chronic neck pain    -  Primary   Type 2 diabetes mellitus without complication, without long-term current use of insulin (HCC)       Relevant Medications   metFORMIN (GLUCOPHAGE) 500 MG tablet   losartan (COZAAR) 50 MG tablet   atorvastatin (LIPITOR) 40 MG tablet   Other Relevant Orders   Glucose (CBG)   Chronic obstructive pulmonary disease, unspecified COPD type (HCC)       Relevant Medications   SYMBICORT 160-4.5 MCG/ACT inhaler   albuterol (VENTOLIN HFA) 108 (90 Base) MCG/ACT inhaler   History of prediabetes       Relevant Medications   metFORMIN  (GLUCOPHAGE) 500 MG tablet   Diabetes mellitus due to underlying condition with hyperosmolarity without coma, without long-term current use of insulin (HCC)       Relevant Medications   metFORMIN (GLUCOPHAGE) 500 MG tablet   losartan (COZAAR) 50 MG tablet   atorvastatin (LIPITOR) 40 MG tablet   Essential hypertension       Relevant Medications   atenolol (TENORMIN) 50 MG tablet   losartan (COZAAR) 50 MG tablet   atorvastatin (LIPITOR) 40 MG tablet   cloNIDine (CATAPRES) 0.1 MG tablet   Mixed hyperlipidemia       Relevant Medications   atenolol (TENORMIN) 50 MG tablet   losartan (COZAAR) 50 MG tablet   atorvastatin (LIPITOR) 40 MG tablet   cloNIDine (CATAPRES) 0.1 MG tablet   Medication refill       Relevant Medications   cloNIDine (CATAPRES) 0.1 MG tablet   Moderate persistent asthma without complication       Relevant Medications   SYMBICORT 160-4.5 MCG/ACT inhaler   albuterol (VENTOLIN HFA) 108 (90 Base) MCG/ACT inhaler   Nocturia       Relevant Orders   PSA   Class 2 severe obesity with serious comorbidity and body mass index (BMI) of 39.0 to 39.9 in adult, unspecified obesity type (HCC)       Relevant Medications   metFORMIN (GLUCOPHAGE) 500 MG tablet      Evan Brock was seen today for follow-up.  Diagnoses and all orders for this visit:  Chronic neck pain  Type 2 diabetes mellitus without complication, without long-term current use of insulin (HCC) -     Glucose (CBG) -     atorvastatin (LIPITOR) 40 MG tablet; Take 1 tablet (40 mg total) by mouth daily.  Essential hypertension, benign Counseled on blood pressure goal of less than 130/80, low-sodium, DASH diet, medication compliance, 150 minutes of moderate intensity exercise per week. Discussed medication compliance, adverse effects.  Chronic obstructive pulmonary disease, unspecified COPD type ([place]) No longer followed by pulmonology no exacerbation on Symbicort and albuteral    Diabetes mellitus due to underlying  condition with hyperosmolarity without coma, without long-term current use of insulin (HCC) -     metFORMIN (GLUCOPHAGE) 500 MG tablet; Take 1 tablet (500 mg total) by mouth 2 (two) times daily with a meal.  Essential hypertension -     atenolol (TENORMIN) 50 MG tablet; Take 1 tablet (50 mg total) by mouth daily. -     losartan (COZAAR) 50 MG tablet; Take 1 tablet (50 mg total) by mouth daily.  Mixed hyperlipidemia  Healthy lifestyle diet of fruits vegetables fish nuts whole grains and low saturated fat . Foods high in cholesterol or liver, fatty meats,cheese, butter avocados, nuts and seeds, chocolate and fried foods. -     atorvastatin (LIPITOR) 40 MG tablet; Take 1 tablet (40 mg total) by mouth daily.  Medication refill -     cloNIDine (CATAPRES) 0.1 MG tablet; Take 1 tablet (0.1 mg total) by mouth 2 (two) times daily.  Moderate persistent asthma without complication -     albuterol (VENTOLIN HFA) 108 (90 Base) MCG/ACT inhaler; Inhale 1 puff into the lungs every 6 (six) hours as needed for wheezing or shortness of breath.  Nocturia -     PSA  Class 2 severe obesity with serious comorbidity and body mass index (BMI) of 39.0 to 39.9 in adult, unspecified obesity type ([place])  Other orders -     SYMBICORT 160-4.5 MCG/ACT inhaler; Inhale 2 puffs into the lungs 2 (two) times a day.   Meds ordered this encounter  Medications  . metFORMIN (GLUCOPHAGE) 500 MG tablet    Sig: Take 1 tablet (500 mg total) by mouth 2 (two) times daily with a meal.    Dispense:  180 tablet    Refill:  0  . atenolol (TENORMIN) 50 MG tablet    Sig: Take 1 tablet (50 mg total) by mouth daily.    Dispense:  90 tablet    Refill:  0  . losartan (COZAAR) 50 MG tablet    Sig: Take 1 tablet (50 mg total) by mouth daily.    Dispense:  90 tablet    Refill:  0  . atorvastatin (LIPITOR) 40 MG tablet    Sig: Take 1 tablet (40 mg total) by mouth daily.    Dispense:  90 tablet    Refill:  0  . cloNIDine (CATAPRES)  0.1 MG tablet    Sig: Take 1 tablet (0.1 mg total) by mouth 2 (two) times daily.    Dispense:  60 tablet    Refill:  3  . SYMBICORT 160-4.5 MCG/ACT inhaler    Sig: Inhale 2 puffs into the lungs 2 (two) times a day.    Dispense:  1 Inhaler    Refill:  3  . albuterol (VENTOLIN HFA) 108 (90 Base) MCG/ACT inhaler    Sig: Inhale 1 puff into the lungs every 6 (six) hours as needed for wheezing or shortness of  breath.    Dispense:  6.7 g    Refill:  2    Follow-up: Return in about 6 months (around 11/05/2019) for Hyoertension , Diabetes.    Grayce SessionsMichelle P Edwards, NP

## 2019-05-05 NOTE — Patient Instructions (Signed)
° °Calorie Counting for Weight Loss °Calories are units of energy. Your body needs a certain amount of calories from food to keep you going throughout the day. When you eat more calories than your body needs, your body stores the extra calories as fat. When you eat fewer calories than your body needs, your body burns fat to get the energy it needs. °Calorie counting means keeping track of how many calories you eat and drink each day. Calorie counting can be helpful if you need to lose weight. If you make sure to eat fewer calories than your body needs, you should lose weight. Ask your health care provider what a healthy weight is for you. °For calorie counting to work, you will need to eat the right number of calories in a day in order to lose a healthy amount of weight per week. A dietitian can help you determine how many calories you need in a day and will give you suggestions on how to reach your calorie goal. °· A healthy amount of weight to lose per week is usually 1-2 lb (0.5-0.9 kg). This usually means that your daily calorie intake should be reduced by 500-750 calories. °· Eating 1,200 - 1,500 calories per day can help most women lose weight. °· Eating 1,500 - 1,800 calories per day can help most men lose weight. °What is my plan? °My goal is to have __________ calories per day. °If I have this many calories per day, I should lose around __________ pounds per week. °What do I need to know about calorie counting? °In order to meet your daily calorie goal, you will need to: °· Find out how many calories are in each food you would like to eat. Try to do this before you eat. °· Decide how much of the food you plan to eat. °· Write down what you ate and how many calories it had. Doing this is called keeping a food log. °To successfully lose weight, it is important to balance calorie counting with a healthy lifestyle that includes regular activity. Aim for 150 minutes of moderate exercise (such as walking) or 75  minutes of vigorous exercise (such as running) each week. °Where do I find calorie information? ° °The number of calories in a food can be found on a Nutrition Facts label. If a food does not have a Nutrition Facts label, try to look up the calories online or ask your dietitian for help. °Remember that calories are listed per serving. If you choose to have more than one serving of a food, you will have to multiply the calories per serving by the amount of servings you plan to eat. For example, the label on a package of bread might say that a serving size is 1 slice and that there are 90 calories in a serving. If you eat 1 slice, you will have eaten 90 calories. If you eat 2 slices, you will have eaten 180 calories. °How do I keep a food log? °Immediately after each meal, record the following information in your food log: °· What you ate. Don't forget to include toppings, sauces, and other extras on the food. °· How much you ate. This can be measured in cups, ounces, or number of items. °· How many calories each food and drink had. °· The total number of calories in the meal. °Keep your food log near you, such as in a small notebook in your pocket, or use a mobile app or website. Some programs will   calculate calories for you and show you how many calories you have left for the day to meet your goal. °What are some calorie counting tips? ° °· Use your calories on foods and drinks that will fill you up and not leave you hungry: °? Some examples of foods that fill you up are nuts and nut butters, vegetables, lean proteins, and high-fiber foods like whole grains. High-fiber foods are foods with more than 5 g fiber per serving. °? Drinks such as sodas, specialty coffee drinks, alcohol, and juices have a lot of calories, yet do not fill you up. °· Eat nutritious foods and avoid empty calories. Empty calories are calories you get from foods or beverages that do not have many vitamins or protein, such as candy, sweets, and  soda. It is better to have a nutritious high-calorie food (such as an avocado) than a food with few nutrients (such as a bag of chips). °· Know how many calories are in the foods you eat most often. This will help you calculate calorie counts faster. °· Pay attention to calories in drinks. Low-calorie drinks include water and unsweetened drinks. °· Pay attention to nutrition labels for "low fat" or "fat free" foods. These foods sometimes have the same amount of calories or more calories than the full fat versions. They also often have added sugar, starch, or salt, to make up for flavor that was removed with the fat. °· Find a way of tracking calories that works for you. Get creative. Try different apps or programs if writing down calories does not work for you. °What are some portion control tips? °· Know how many calories are in a serving. This will help you know how many servings of a certain food you can have. °· Use a measuring cup to measure serving sizes. You could also try weighing out portions on a kitchen scale. With time, you will be able to estimate serving sizes for some foods. °· Take some time to put servings of different foods on your favorite plates, bowls, and cups so you know what a serving looks like. °· Try not to eat straight from a bag or box. Doing this can lead to overeating. Put the amount you would like to eat in a cup or on a plate to make sure you are eating the right portion. °· Use smaller plates, glasses, and bowls to prevent overeating. °· Try not to multitask (for example, watch TV or use your computer) while eating. If it is time to eat, sit down at a table and enjoy your food. This will help you to know when you are full. It will also help you to be aware of what you are eating and how much you are eating. °What are tips for following this plan? °Reading food labels °· Check the calorie count compared to the serving size. The serving size may be smaller than what you are used to  eating. °· Check the source of the calories. Make sure the food you are eating is high in vitamins and protein and low in saturated and trans fats. °Shopping °· Read nutrition labels while you shop. This will help you make healthy decisions before you decide to purchase your food. °· Make a grocery list and stick to it. °Cooking °· Try to cook your favorite foods in a healthier way. For example, try baking instead of frying. °· Use low-fat dairy products. °Meal planning °· Use more fruits and vegetables. Half of your plate should be   fruits and vegetables. °· Include lean proteins like poultry and fish. °How do I count calories when eating out? °· Ask for smaller portion sizes. °· Consider sharing an entree and sides instead of getting your own entree. °· If you get your own entree, eat only half. Ask for a box at the beginning of your meal and put the rest of your entree in it so you are not tempted to eat it. °· If calories are listed on the menu, choose the lower calorie options. °· Choose dishes that include vegetables, fruits, whole grains, low-fat dairy products, and lean protein. °· Choose items that are boiled, broiled, grilled, or steamed. Stay away from items that are buttered, battered, fried, or served with cream sauce. Items labeled "crispy" are usually fried, unless stated otherwise. °· Choose water, low-fat milk, unsweetened iced tea, or other drinks without added sugar. If you want an alcoholic beverage, choose a lower calorie option such as a glass of wine or light beer. °· Ask for dressings, sauces, and syrups on the side. These are usually high in calories, so you should limit the amount you eat. °· If you want a salad, choose a garden salad and ask for grilled meats. Avoid extra toppings like bacon, cheese, or fried items. Ask for the dressing on the side, or ask for olive oil and vinegar or lemon to use as dressing. °· Estimate how many servings of a food you are given. For example, a serving of  cooked rice is ½ cup or about the size of half a baseball. Knowing serving sizes will help you be aware of how much food you are eating at restaurants. The list below tells you how big or small some common portion sizes are based on everyday objects: °? 1 oz--4 stacked dice. °? 3 oz--1 deck of cards. °? 1 tsp--1 die. °? 1 Tbsp--½ a ping-pong ball. °? 2 Tbsp--1 ping-pong ball. °? ½ cup--½ baseball. °? 1 cup--1 baseball. °Summary °· Calorie counting means keeping track of how many calories you eat and drink each day. If you eat fewer calories than your body needs, you should lose weight. °· A healthy amount of weight to lose per week is usually 1-2 lb (0.5-0.9 kg). This usually means reducing your daily calorie intake by 500-750 calories. °· The number of calories in a food can be found on a Nutrition Facts label. If a food does not have a Nutrition Facts label, try to look up the calories online or ask your dietitian for help. °· Use your calories on foods and drinks that will fill you up, and not on foods and drinks that will leave you hungry. °· Use smaller plates, glasses, and bowls to prevent overeating. °This information is not intended to replace advice given to you by your health care provider. Make sure you discuss any questions you have with your health care provider. °Document Released: 10/09/2005 Document Revised: 06/28/2018 Document Reviewed: 09/08/2016 °Elsevier Patient Education © 2020 [employer] ° °

## 2019-05-06 LAB — PSA: Prostate Specific Ag, Serum: 4.2 ng/mL — ABNORMAL HIGH (ref 0.0–4.0)

## 2019-06-03 MED FILL — DULERA 200 MCG/5 MCG INH: 200-5 | 30 days supply | Qty: 13 | Fill #2

## 2019-06-04 ENCOUNTER — Telehealth (INDEPENDENT_AMBULATORY_CARE_PROVIDER_SITE_OTHER): Payer: Self-pay

## 2019-06-04 NOTE — Telephone Encounter (Signed)
Patient called to request medication refill for amLODipine (NORVASC) 10 MG tablet  terazosin (HYTRIN) 2 MG capsule    Patient uses [facility], [place] [address] AT [place]  Please advice [phone]   Thank you Whitney Post

## 2019-06-04 NOTE — Telephone Encounter (Signed)
FWD to PCP. Lerlene Treadwell S Masayuki Sakai, CMA  

## 2019-06-05 ENCOUNTER — Other Ambulatory Visit (INDEPENDENT_AMBULATORY_CARE_PROVIDER_SITE_OTHER): Payer: Self-pay | Admitting: Primary Care

## 2019-06-05 DIAGNOSIS — I10 Essential (primary) hypertension: Secondary | ICD-10-CM

## 2019-06-05 MED ORDER — AMLODIPINE BESYLATE 10 MG PO TABS: 10.0000 mg | ORAL_TABLET | Freq: Every day | ORAL | 0 refills | Status: DC

## 2019-06-05 MED ORDER — TERAZOSIN HCL 2 MG PO CAPS: 2.0000 mg | ORAL_CAPSULE | Freq: Every day | ORAL | 0 refills | Status: DC

## 2019-06-27 MED FILL — IPRAT-ALBUT 0.5-3(2.5) MG/3: 0.5-2.5 (3) | 15 days supply | Qty: 180 | Fill #2

## 2019-07-24 ENCOUNTER — Other Ambulatory Visit: Payer: Self-pay | Admitting: Primary Care

## 2019-07-24 DIAGNOSIS — I10 Essential (primary) hypertension: Secondary | ICD-10-CM

## 2019-07-24 DIAGNOSIS — E782 Mixed hyperlipidemia: Secondary | ICD-10-CM

## 2019-07-24 DIAGNOSIS — E119 Type 2 diabetes mellitus without complications: Secondary | ICD-10-CM

## 2019-07-24 MED FILL — ATORVASTATIN CALCIUM 40 MG: 40 | 90 days supply | Qty: 90 | Fill #0

## 2019-07-24 MED FILL — ATENOLOL 50 MG TABLET: 50 | 90 days supply | Qty: 90 | Fill #0

## 2019-07-24 MED FILL — cloNIDine HCL 0.1 MG TABS: 0.1 | 30 days supply | Qty: 60 | Fill #0

## 2019-08-05 ENCOUNTER — Encounter (INDEPENDENT_AMBULATORY_CARE_PROVIDER_SITE_OTHER): Payer: Self-pay | Admitting: Primary Care

## 2019-08-05 ENCOUNTER — Other Ambulatory Visit: Payer: Self-pay

## 2019-08-05 ENCOUNTER — Ambulatory Visit (INDEPENDENT_AMBULATORY_CARE_PROVIDER_SITE_OTHER): Payer: Medicare Other | Admitting: Primary Care

## 2019-08-05 VITALS — BP 103/68 | HR 74 | Temp 97.1°F | Ht 69.0 in | Wt 207.4 lb

## 2019-08-05 DIAGNOSIS — I10 Essential (primary) hypertension: Secondary | ICD-10-CM

## 2019-08-05 DIAGNOSIS — N529 Male erectile dysfunction, unspecified: Secondary | ICD-10-CM

## 2019-08-05 DIAGNOSIS — Z23 Encounter for immunization: Secondary | ICD-10-CM | POA: Diagnosis not present

## 2019-08-05 DIAGNOSIS — R351 Nocturia: Secondary | ICD-10-CM

## 2019-08-05 DIAGNOSIS — Z760 Encounter for issue of repeat prescription: Secondary | ICD-10-CM

## 2019-08-05 DIAGNOSIS — E119 Type 2 diabetes mellitus without complications: Secondary | ICD-10-CM

## 2019-08-05 LAB — POCT GLYCOSYLATED HEMOGLOBIN (HGB A1C): Hemoglobin A1C: 5.8 % — AB (ref 4.0–5.6)

## 2019-08-05 LAB — GLUCOSE, POCT (MANUAL RESULT ENTRY): POC Glucose: 114 mg/dl — AB (ref 70–99)

## 2019-08-05 MED ORDER — CLONIDINE HCL 0.1 MG PO TABS: 0.1000 mg | ORAL_TABLET | Freq: Every day | ORAL | 3 refills | Status: DC

## 2019-08-05 MED ORDER — TADALAFIL 20 MG PO TABS: 10.0000 mg | ORAL_TABLET | ORAL | 3 refills | Status: DC | PRN

## 2019-08-05 NOTE — Patient Instructions (Signed)
We have discussed target BP range and blood pressure goal. I have advised patient to check BP regularly and to call us back or report to clinic if the numbers are consistently higher than 140/90. We discussed the importance of compliance with medical therapy and DASH diet recommended, consequences of uncontrolled hypertension discussed.  Medication change take clonodine o.1mg  at night only

## 2019-08-05 NOTE — Progress Notes (Signed)
Established Patient Office Visit  Subjective:  Patient ID: Evan Brock, male    DOB: 1951-08-12  Age: 68 y.o. MRN: [id number]  CC:  Chief Complaint  Patient presents with  . Follow-up    hypertension/diabetes     HPI Evan Brock presents for blood is low 103/68 taking all medications as prescribed. Making an adjustment to medications . He denies shortness of breath, headaches, chest pain or lower extremity edema. Diabetes well controlled he denies increase thirst or hunger but does have increase urination especially at night.  Past Medical History:  Diagnosis Date  . Arthritis    "hips" (10/01/2013)  . Asthma   . COPD (chronic obstructive pulmonary disease) ([place])   . Exertional shortness of breath   . Hypertension     Past Surgical History:  Procedure Laterality Date  . JOINT REPLACEMENT    . TOTAL HIP ARTHROPLASTY Right 2010  . TOTAL HIP ARTHROPLASTY Left 08/19/2013   Procedure: TOTAL HIP ARTHROPLASTY ANTERIOR APPROACH;  Surgeon: Hessie Dibble, MD;  Location: [facility];  Service: Orthopedics;  Laterality: Left;    Family History  Problem Relation Age of Onset  . Diabetes Mother   . Hypertension Father   . Lupus Sister     Social History   Socioeconomic History  . Marital status: Single    Spouse name: Not on file  . Number of children: Not on file  . Years of education: Not on file  . Highest education level: Not on file  Occupational History  . Not on file  Social Needs  . Financial resource strain: Not on file  . Food insecurity    Worry: Not on file    Inability: Not on file  . Transportation needs    Medical: Not on file    Non-medical: Not on file  Tobacco Use  . Smoking status: Former Smoker    Packs/day: 0.00    Years: 0.00    Pack years: 0.00    Types: Cigarettes    Quit date: 10/23/1990    Years since quitting: 28.8  . Smokeless tobacco: Never Used  . Tobacco comment: 10/01/2013 "quit smoking cigarettes~ 15-20 yr ago"  Substance and  Sexual Activity  . Alcohol use: Yes    Alcohol/week: 12.0 standard drinks    Types: 12 Cans of beer per week    Comment: 08/19/2013 "3 times/wk; 3-4 beers/night"  . Drug use: Yes    Types: Marijuana    Comment: 10/01/2013 "last drug use was 10 yr ago"  . Sexual activity: Not Currently  Lifestyle  . Physical activity    Days per week: Not on file    Minutes per session: Not on file  . Stress: Not on file  Relationships  . Social Herbalist on phone: Not on file    Gets together: Not on file    Attends religious service: Not on file    Active member of club or organization: Not on file    Attends meetings of clubs or organizations: Not on file    Relationship status: Not on file  . Intimate partner violence    Fear of current or ex partner: Not on file    Emotionally abused: Not on file    Physically abused: Not on file    Forced sexual activity: Not on file  Other Topics Concern  . Not on file  Social History Narrative  . Not on file    Outpatient Medications Prior to Visit  Medication Sig Dispense Refill  . albuterol (VENTOLIN HFA) 108 (90 Base) MCG/ACT inhaler Inhale 1 puff into the lungs every 6 (six) hours as needed for wheezing or shortness of breath. 6.7 g 2  . amLODipine (NORVASC) 10 MG tablet TAKE 1 TABLET (10 MG TOTAL) BY MOUTH DAILY. 90 tablet 0  . atenolol (TENORMIN) 50 MG tablet TAKE 1 TABLET (50 MG TOTAL) BY MOUTH DAILY. 90 tablet 0  . atorvastatin (LIPITOR) 40 MG tablet TAKE 1 TABLET (40 MG TOTAL) BY MOUTH DAILY. 90 tablet 0  . fluticasone (FLOVENT HFA) 110 MCG/ACT inhaler Inhale 1 puff into the lungs 2 (two) times daily. 1 Inhaler 1  . gabapentin (NEURONTIN) 300 MG capsule Take 1 capsule (300 mg total) by mouth 3 (three) times daily. 90 capsule 0  . ipratropium-albuterol (DUONEB) 0.5-2.5 (3) MG/3ML SOLN Take 3 mLs by nebulization every 6 (six) hours as needed. 180 mL 3  . losartan (COZAAR) 50 MG tablet Take 1 tablet (50 mg total) by mouth daily. 90  tablet 0  . metFORMIN (GLUCOPHAGE) 500 MG tablet Take 1 tablet (500 mg total) by mouth 2 (two) times daily with a meal. 180 tablet 0  . mometasone-formoterol (DULERA) 200-5 MCG/ACT AERO Inhale 2 puffs into the lungs 2 (two) times daily. 1 Inhaler 5  . montelukast (SINGULAIR) 10 MG tablet Take 1 tablet (10 mg total) by mouth at bedtime. 30 tablet 5  . SYMBICORT 160-4.5 MCG/ACT inhaler Inhale 2 puffs into the lungs 2 (two) times a day. 1 Inhaler 3  . terazosin (HYTRIN) 2 MG capsule Take 1 capsule (2 mg total) by mouth daily. 90 capsule 0  . cloNIDine (CATAPRES) 0.1 MG tablet Take 1 tablet (0.1 mg total) by mouth 2 (two) times daily. 60 tablet 3   No facility-administered medications prior to visit.     No Known Allergies  ROS Review of Systems  Endocrine: Positive for polyuria.  Genitourinary: Positive for frequency.       Erectile dysfunction  All other systems reviewed and are negative.     Objective:    Physical Exam  Constitutional: He is oriented to person, place, and time. He appears well-developed and well-nourished.  HENT:  Head: Normocephalic.  Eyes: Pupils are equal, round, and reactive to light. EOM are normal.  Neck: Normal range of motion.  Cardiovascular: Normal rate and regular rhythm.  Pulmonary/Chest: Effort normal and breath sounds normal.  Abdominal: Soft. Bowel sounds are normal. He exhibits distension.  Musculoskeletal: Normal range of motion.  Neurological: He is oriented to person, place, and time.  Skin: Skin is warm.  Psychiatric: He has a normal mood and affect.    BP 103/68 (BP Location: Left Arm, Patient Position: Sitting, Cuff Size: Large)   Pulse 74   Temp (!) 97.1 F (36.2 C) (Temporal)   Ht 5' 9"  (1.753 m)   Wt 207 lb 6.4 oz (94.1 kg)   SpO2 92%   BMI 30.63 kg/m  Wt Readings from Last 3 Encounters:  08/05/19 207 lb 6.4 oz (94.1 kg)  05/05/19 208 lb 12.8 oz (94.7 kg)  02/03/19 226 lb 9.6 oz (102.8 kg)     Health Maintenance Due   Topic Date Due  . OPHTHALMOLOGY EXAM  06/22/2018  . INFLUENZA VACCINE  05/24/2019  . HEMOGLOBIN A1C  08/05/2019    There are no preventive care reminders to display for this patient.  Lab Results  Component Value Date   TSH 2.100 11/05/2017   Lab Results  Component Value  Date   WBC 10.7 11/05/2017   HGB 15.4 11/05/2017   HCT 45.7 11/05/2017   MCV 80 11/05/2017   PLT 303 11/05/2017   Lab Results  Component Value Date   NA 139 08/05/2018   K 4.7 08/05/2018   CO2 27 08/05/2018   GLUCOSE 99 08/05/2018   BUN 14 08/05/2018   CREATININE 1.13 08/05/2018   BILITOT 0.5 08/05/2018   ALKPHOS 51 08/05/2018   AST 12 08/05/2018   ALT 15 08/05/2018   PROT 6.6 08/05/2018   ALBUMIN 4.0 08/05/2018   CALCIUM 9.3 08/05/2018   ANIONGAP 9 10/05/2015   Lab Results  Component Value Date   CHOL 179 08/05/2018   Lab Results  Component Value Date   HDL 70 08/05/2018   Lab Results  Component Value Date   LDLCALC 94 08/05/2018   Lab Results  Component Value Date   TRIG 76 08/05/2018   Lab Results  Component Value Date   CHOLHDL 2.6 08/05/2018   Lab Results  Component Value Date   HGBA1C 5.8 (A) 08/05/2019      Assessment & Plan:  Glade was seen today for follow-up.  Diagnoses and all orders for this visit:  Type 2 diabetes mellitus without complication, without long-term current use of insulin ([place]) Well controlled without medication diagnosis change to prediabetes  -     Glucose (CBG) -     HgB A1c 5.8 -     Lipid Panel -     CBC with Differential -     Ambulatory referral to Ophthalmology  Medication refill -     cloNIDine (CATAPRES) 0.1 MG tablet; Take 1 tablet (0.1 mg total) by mouth daily.  Essential hypertension Hypotensive at this visit . Patient is able to check blood pressure at home. Discontinued clonidine from .85m bid to only Qhs  -     cloNIDine (CATAPRES) 0.1 MG tablet; Take 1 tablet (0.1 mg total) by mouth daily. -      CMP14+EGFR  Nocturia Patient has to get up 2-3 times a night for urination he is 68 and PSA recommend in AA males as early as 40 if family history . Rule out BPH. -     PSA  Erectile dysfunction, unspecified erectile dysfunction type This is a new problem. Additional work-up: no. We discussed different medications for ED including Viagra, Levitra, and Cialis. We also discussed the risks of such medications, including priapism, monocular vision loss, and hypotension with nitrates. All questions were answered. The patient opted for: Will refer to urologist if PSA is elevated.  []   Trial of Viagra; no contraindications. [x]   Trial of Cialis; no contraindications. []   Trial of Levitra; no contraindications. []   Referral to Urology for further evaluation.  -     tadalafil (CIALIS) 20 MG tablet; Take 0.5-1 tablets (10-20 mg total) by mouth every other day as needed for erectile dysfunction.  Need for immunization against influenza -     Flu Vaccine QUAD 36+ mos IM   Meds ordered this encounter  Medications  . cloNIDine (CATAPRES) 0.1 MG tablet    Sig: Take 1 tablet (0.1 mg total) by mouth daily.    Dispense:  30 tablet    Refill:  3  . tadalafil (CIALIS) 20 MG tablet    Sig: Take 0.5-1 tablets (10-20 mg total) by mouth every other day as needed for erectile dysfunction.    Dispense:  5 tablet    Refill:  3    Follow-up: Return  in about 3 months (around 11/05/2019) for tele in 2 weeks for bp eval. than 3 months follow up.    Kerin Perna, NP

## 2019-08-06 LAB — CMP14+EGFR
ALT: 15 IU/L (ref 0–44)
AST: 13 IU/L (ref 0–40)
Albumin/Globulin Ratio: 1.5 (ref 1.2–2.2)
Albumin: 4.1 g/dL (ref 3.8–4.8)
Alkaline Phosphatase: 64 IU/L (ref 39–117)
BUN/Creatinine Ratio: 17 (ref 10–24)
BUN: 19 mg/dL (ref 8–27)
Bilirubin Total: 0.4 mg/dL (ref 0.0–1.2)
CO2: 23 mmol/L (ref 20–29)
Calcium: 9.7 mg/dL (ref 8.6–10.2)
Chloride: 101 mmol/L (ref 96–106)
Creatinine, Ser: 1.13 mg/dL (ref 0.76–1.27)
GFR calc Af Amer: 76 mL/min/{1.73_m2} (ref >59)
GFR calc non Af Amer: 66 mL/min/{1.73_m2} (ref >59)
Globulin, Total: 2.7 g/dL (ref 1.5–4.5)
Glucose: 121 mg/dL — ABNORMAL HIGH (ref 65–99)
Potassium: 4.8 mmol/L (ref 3.5–5.2)
Sodium: 139 mmol/L (ref 134–144)
Total Protein: 6.8 g/dL (ref 6.0–8.5)

## 2019-08-06 LAB — CBC WITH DIFFERENTIAL/PLATELET
Basophils Absolute: 0.1 10*3/uL (ref 0.0–0.2)
Basos: 1 %
EOS (ABSOLUTE): 0.2 10*3/uL (ref 0.0–0.4)
Eos: 3 %
Hematocrit: 42.3 % (ref 37.5–51.0)
Hemoglobin: 13.4 g/dL (ref 13.0–17.7)
Immature Grans (Abs): 0 10*3/uL (ref 0.0–0.1)
Immature Granulocytes: 0 %
Lymphocytes Absolute: 1.3 10*3/uL (ref 0.7–3.1)
Lymphs: 17 %
MCH: 26.5 pg — ABNORMAL LOW (ref 26.6–33.0)
MCHC: 31.7 g/dL (ref 31.5–35.7)
MCV: 84 fL (ref 79–97)
Monocytes Absolute: 0.9 10*3/uL (ref 0.1–0.9)
Monocytes: 11 %
Neutrophils Absolute: 5.1 10*3/uL (ref 1.4–7.0)
Neutrophils: 68 %
Platelets: 219 10*3/uL (ref 150–450)
RBC: 5.06 x10E6/uL (ref 4.14–5.80)
RDW: 14.1 % (ref 11.6–15.4)
WBC: 7.5 10*3/uL (ref 3.4–10.8)

## 2019-08-06 LAB — PSA: Prostate Specific Ag, Serum: 3.1 ng/mL (ref 0.0–4.0)

## 2019-08-06 LAB — LIPID PANEL
Chol/HDL Ratio: 2.9 ratio (ref 0.0–5.0)
Cholesterol, Total: 152 mg/dL (ref 100–199)
HDL: 52 mg/dL (ref >39)
LDL Chol Calc (NIH): 88 mg/dL (ref 0–99)
Triglycerides: 58 mg/dL (ref 0–149)
VLDL Cholesterol Cal: 12 mg/dL (ref 5–40)

## 2019-08-09 ENCOUNTER — Other Ambulatory Visit (INDEPENDENT_AMBULATORY_CARE_PROVIDER_SITE_OTHER): Payer: Self-pay | Admitting: Primary Care

## 2019-08-09 DIAGNOSIS — I10 Essential (primary) hypertension: Secondary | ICD-10-CM

## 2019-08-11 NOTE — Telephone Encounter (Signed)
FWD to PCP. Tempestt S Roberts, CMA  

## 2019-08-12 ENCOUNTER — Other Ambulatory Visit (INDEPENDENT_AMBULATORY_CARE_PROVIDER_SITE_OTHER): Payer: Self-pay | Admitting: Primary Care

## 2019-08-12 DIAGNOSIS — I10 Essential (primary) hypertension: Secondary | ICD-10-CM

## 2019-08-12 MED ORDER — LOSARTAN POTASSIUM 50 MG PO TABS: 50.0000 mg | ORAL_TABLET | Freq: Every day | ORAL | 0 refills | Status: DC

## 2019-08-12 MED ORDER — TERAZOSIN HCL 2 MG PO CAPS: 2.0000 mg | ORAL_CAPSULE | Freq: Every day | ORAL | 0 refills | Status: DC

## 2019-08-18 ENCOUNTER — Ambulatory Visit (INDEPENDENT_AMBULATORY_CARE_PROVIDER_SITE_OTHER): Payer: Medicare Other | Admitting: Primary Care

## 2019-08-20 ENCOUNTER — Other Ambulatory Visit: Payer: Self-pay

## 2019-08-20 ENCOUNTER — Ambulatory Visit (INDEPENDENT_AMBULATORY_CARE_PROVIDER_SITE_OTHER): Payer: Medicare Other | Admitting: Primary Care

## 2019-08-20 VITALS — BP 121/81 | HR 65 | Temp 97.1°F | Ht 69.0 in | Wt 202.4 lb

## 2019-08-20 DIAGNOSIS — E119 Type 2 diabetes mellitus without complications: Secondary | ICD-10-CM | POA: Diagnosis not present

## 2019-08-20 DIAGNOSIS — I10 Essential (primary) hypertension: Secondary | ICD-10-CM

## 2019-08-20 LAB — GLUCOSE, POCT (MANUAL RESULT ENTRY): POC Glucose: 107 mg/dl — AB (ref 70–99)

## 2019-08-20 NOTE — Progress Notes (Signed)
Evan Brock presents for  hypertension evaluation, on previous visit medication was adjusted to include adding. amlodipine 10mg  daily   Patient is adherence with medications.  Current Medication List Albuterol HFA 1 puff every 6 hours as needed Amlodipine 10 mg daily Atenolol 50 mg daily Atorvastatin 40 mg daily Flovent HFA 110 mcg/inhaler Gabapentin 300 mg 3 times a day Losartan 50 mg daily Metformin 500 mg twice daily  Past Medical History:  Diagnosis Date  . Arthritis    "hips" (10/01/2013)  . Asthma   . COPD (chronic obstructive pulmonary disease) ([facility])   . Exertional shortness of breath   . Hypertension     Dietary habits include: decrease to no sodium intake change to DASH continue to eat pork but in moderation.   Exercise habits include: Walking every other day for 1- 1.5 hours  Family / Social history: Diabetes   ASCVD risk factors include- 14/07/2013  O:  Physical Exam Vitals signs reviewed.  Constitutional:      Appearance: Normal appearance. He is obese.  Neck:     Musculoskeletal: Neck supple.  Cardiovascular:     Rate and Rhythm: Normal rate and regular rhythm.  Pulmonary:     Effort: Pulmonary effort is normal.     Breath sounds: Normal breath sounds.  Abdominal:     General: Abdomen is flat. There is distension.     Palpations: Abdomen is soft.  Neurological:     Mental Status: He is alert and oriented to person, place, and time.  Psychiatric:        Mood and Affect: Mood normal.        Behavior: Behavior normal.        Thought Content: Thought content normal.        Judgment: Judgment normal.      Review of Systems  All other systems reviewed and are negative.   Last 3 Office BP readings: BP Readings from Last 3 Encounters:  08/20/19 121/81  08/05/19 103/68  05/05/19 111/74    BMET    Component Value Date/Time   NA 139 08/05/2019 0927   K 4.8 08/05/2019 0927   CL 101 08/05/2019 0927   CO2 23 08/05/2019 0927   GLUCOSE 121 (H)  08/05/2019 0927   GLUCOSE 187 (H) 10/05/2015 1505   BUN 19 08/05/2019 0927   CREATININE 1.13 08/05/2019 0927   CALCIUM 9.7 08/05/2019 0927   GFRNONAA 66 08/05/2019 0927   GFRAA 76 08/05/2019 0927    Renal function: Estimated Creatinine Clearance: 69 mL/min (by C-G formula based on SCr of 1.13 mg/dL).  Clinical ASCVD:  The 10-year ASCVD risk score 08/07/2019 DC Evan George., Evan al., Evan Brock) is: 27.1%   Values used to calculate the score:     Age: 68 years     Sex: Male     Is Non-Hispanic African American: Yes     Diabetic: Yes     Tobacco smoker: No     Systolic Blood Pressure: 121 mmHg     Is BP treated: Yes     HDL Cholesterol: 52 mg/dL     Total Cholesterol: 152 mg/dL   A/P: Hypertension longstandingdiagnosed currently on amlodipine 10 mg, atenolol 50 mg, clonidine 0.1 mg all taken daily. on current medications. BP Goal at goal less than 130/80 today's blood pressure was 121/81 mmHg. Patient is  adherent with current medications.   no medication adjustment at this visit continue as prescribed -F/u labs ordered -Future CBC, CMP, lipids, -Counseled on lifestyle modifications for  blood pressure control including reduced dietary sodium, increased exercise, adequate sleep

## 2019-08-20 NOTE — Patient Instructions (Signed)

## 2019-08-22 MED FILL — DULERA 200 MCG/5 MCG INH: 200-5 | 30 days supply | Qty: 13 | Fill #3

## 2019-08-25 ENCOUNTER — Other Ambulatory Visit (INDEPENDENT_AMBULATORY_CARE_PROVIDER_SITE_OTHER): Payer: Self-pay | Admitting: Primary Care

## 2019-08-25 DIAGNOSIS — J4540 Moderate persistent asthma, uncomplicated: Secondary | ICD-10-CM

## 2019-08-25 MED ORDER — ALBUTEROL SULFATE HFA 108 (90 BASE) MCG/ACT IN AERS: 1.0000 | INHALATION_SPRAY | Freq: Four times a day (QID) | RESPIRATORY_TRACT | 2 refills | Status: DC | PRN

## 2019-08-25 MED ORDER — SYMBICORT 160-4.5 MCG/ACT IN AERO: 2.0000 | INHALATION_SPRAY | Freq: Two times a day (BID) | RESPIRATORY_TRACT | 3 refills | Status: DC

## 2019-08-25 MED FILL — SYMBICORT 160-4.5 MCG INH: 160-4.5 | 30 days supply | Qty: 10 | Fill #0

## 2019-08-25 MED FILL — PROAIR HFA 90 MCG INHALER: 108 (90 BAS | 50 days supply | Qty: 9 | Fill #0

## 2019-09-01 ENCOUNTER — Other Ambulatory Visit: Payer: Self-pay

## 2019-09-01 DIAGNOSIS — Z20822 Contact with and (suspected) exposure to covid-19: Secondary | ICD-10-CM

## 2019-09-02 LAB — NOVEL CORONAVIRUS, NAA: SARS-CoV-2, NAA: NOT DETECTED

## 2019-09-25 ENCOUNTER — Other Ambulatory Visit: Payer: Self-pay

## 2019-09-25 DIAGNOSIS — Z20822 Contact with and (suspected) exposure to covid-19: Secondary | ICD-10-CM

## 2019-09-27 LAB — NOVEL CORONAVIRUS, NAA: SARS-CoV-2, NAA: NOT DETECTED

## 2019-10-31 MED FILL — DULERA 200 MCG/5 MCG INH: 200-5 | 30 days supply | Qty: 13 | Fill #4

## 2019-10-31 MED FILL — PROAIR HFA 90 MCG INHALER: 108 (90 BAS | 50 days supply | Qty: 9 | Fill #1

## 2019-11-05 ENCOUNTER — Other Ambulatory Visit: Payer: Self-pay

## 2019-11-05 ENCOUNTER — Encounter (INDEPENDENT_AMBULATORY_CARE_PROVIDER_SITE_OTHER): Payer: Self-pay | Admitting: Primary Care

## 2019-11-05 ENCOUNTER — Ambulatory Visit (INDEPENDENT_AMBULATORY_CARE_PROVIDER_SITE_OTHER): Payer: Medicare HMO | Admitting: Primary Care

## 2019-11-05 DIAGNOSIS — E0800 Diabetes mellitus due to underlying condition with hyperosmolarity without nonketotic hyperglycemic-hyperosmolar coma (NKHHC): Secondary | ICD-10-CM

## 2019-11-05 DIAGNOSIS — J4540 Moderate persistent asthma, uncomplicated: Secondary | ICD-10-CM | POA: Diagnosis not present

## 2019-11-05 DIAGNOSIS — Z760 Encounter for issue of repeat prescription: Secondary | ICD-10-CM

## 2019-11-05 DIAGNOSIS — Z87898 Personal history of other specified conditions: Secondary | ICD-10-CM

## 2019-11-05 DIAGNOSIS — E782 Mixed hyperlipidemia: Secondary | ICD-10-CM

## 2019-11-05 DIAGNOSIS — E119 Type 2 diabetes mellitus without complications: Secondary | ICD-10-CM

## 2019-11-05 DIAGNOSIS — M542 Cervicalgia: Secondary | ICD-10-CM

## 2019-11-05 DIAGNOSIS — G8929 Other chronic pain: Secondary | ICD-10-CM

## 2019-11-05 DIAGNOSIS — I10 Essential (primary) hypertension: Secondary | ICD-10-CM | POA: Diagnosis not present

## 2019-11-05 MED ORDER — LOSARTAN POTASSIUM 50 MG PO TABS: 50.0000 mg | ORAL_TABLET | Freq: Every day | ORAL | 1 refills | Status: DC

## 2019-11-05 MED ORDER — BLOOD PRESSURE MONITOR KIT: 1.0000 | PACK | Freq: Three times a day (TID) | 0 refills | Status: DC | PRN

## 2019-11-05 MED ORDER — ALBUTEROL SULFATE HFA 108 (90 BASE) MCG/ACT IN AERS: 1.0000 | INHALATION_SPRAY | Freq: Four times a day (QID) | RESPIRATORY_TRACT | 2 refills | Status: DC | PRN

## 2019-11-05 MED ORDER — ATORVASTATIN CALCIUM 40 MG PO TABS: 40.0000 mg | ORAL_TABLET | Freq: Every day | ORAL | 1 refills | Status: DC

## 2019-11-05 MED ORDER — TERAZOSIN HCL 2 MG PO CAPS: 2.0000 mg | ORAL_CAPSULE | Freq: Every day | ORAL | 1 refills | Status: DC

## 2019-11-05 MED ORDER — METFORMIN HCL 500 MG PO TABS: 500.0000 mg | ORAL_TABLET | Freq: Two times a day (BID) | ORAL | 1 refills | Status: DC

## 2019-11-05 MED ORDER — CLONIDINE HCL 0.1 MG PO TABS: 0.1000 mg | ORAL_TABLET | Freq: Every day | ORAL | 1 refills | Status: DC

## 2019-11-05 MED ORDER — SYMBICORT 160-4.5 MCG/ACT IN AERO: 2.0000 | INHALATION_SPRAY | Freq: Two times a day (BID) | RESPIRATORY_TRACT | 11 refills | Status: DC

## 2019-11-05 MED ORDER — AMLODIPINE BESYLATE 10 MG PO TABS: 10.0000 mg | ORAL_TABLET | Freq: Every day | ORAL | 1 refills | Status: DC

## 2019-11-05 MED ORDER — ATENOLOL 50 MG PO TABS: 50.0000 mg | ORAL_TABLET | Freq: Every day | ORAL | 1 refills | Status: DC

## 2019-11-05 MED ORDER — GABAPENTIN 300 MG PO CAPS: 300.0000 mg | ORAL_CAPSULE | Freq: Three times a day (TID) | ORAL | 1 refills | Status: DC

## 2019-11-05 NOTE — Progress Notes (Signed)
Needs BP cuff  

## 2019-11-05 NOTE — Progress Notes (Signed)
Virtual Visit via Telephone Note  I connected with Evan Brock on 11/05/19 at 10:10 AM EST by telephone and verified that I am speaking with the correct person using two identifiers.   I discussed the limitations, risks, security and privacy concerns of performing an evaluation and management service by telephone and the availability of in person appointments. I also discussed with the patient that there may be a patient responsible charge related to this service. The patient expressed understanding and agreed to proceed.   History of Present Illness: Evan Brock was suppose to have a tele visit to re-evaluate blood pressure on medication. He was to use girlfriends Bp monitor but forgot. Today handed him a prescription for a Bp monitor to take to a medical supply store.  Past Medical History:  Diagnosis Date  . Arthritis    "hips" (10/01/2013)  . Asthma   . COPD (chronic obstructive pulmonary disease) ([place])   . Exertional shortness of breath   . Hypertension      Current Outpatient Medications on File Prior to Visit  Medication Sig Dispense Refill  . tadalafil (CIALIS) 20 MG tablet Take 0.5-1 tablets (10-20 mg total) by mouth every other day as needed for erectile dysfunction. 5 tablet 3  . tiZANidine (ZANAFLEX) 4 MG capsule Take 4 mg by mouth 3 (three) times daily.     No current facility-administered medications on file prior to visit.   Observations/Objective: Review of Systems  All other systems reviewed and are negative.   Assessment and Plan: Evan Brock was seen today for follow-up.  Diagnoses and all orders for this visit:  Medication refill -     cloNIDine (CATAPRES) 0.1 MG tablet; Take 1 tablet (0.1 mg total) by mouth daily.  Essential hypertension Unable to evaluate Bp did not check at home. Goal 130/80 and decrease sodium in diet and exercise can assist with lowering weight, HTN and DM -     cloNIDine (CATAPRES) 0.1 MG tablet; Take 1 tablet (0.1 mg total) by mouth  daily. -     amLODipine (NORVASC) 10 MG tablet; Take 1 tablet (10 mg total) by mouth daily. -     terazosin (HYTRIN) 2 MG capsule; Take 1 capsule (2 mg total) by mouth daily. -     losartan (COZAAR) 50 MG tablet; Take 1 tablet (50 mg total) by mouth daily. -     atenolol (TENORMIN) 50 MG tablet; Take 1 tablet (50 mg total) by mouth daily.  Moderate persistent asthma without complication -     albuterol (VENTOLIN HFA) 108 (90 Base) MCG/ACT inhaler; Inhale 1 puff into the lungs every 6 (six) hours as needed for wheezing or shortness of breath.  Chronic neck pain -     gabapentin (NEURONTIN) 300 MG capsule; Take 1 capsule (300 mg total) by mouth 3 (three) times daily.  Type 2 diabetes mellitus without complication, without long-term current use of insulin ([facility]) -     atorvastatin (LIPITOR) 40 MG tablet; Take 1 tablet (40 mg total) by mouth daily.  Mixed hyperlipidemia -     atorvastatin (LIPITOR) 40 MG tablet; Take 1 tablet (40 mg total) by mouth daily.  Diabetes mellitus due to underlying condition with hyperosmolarity without coma, without long-term current use of insulin ([facility]) -     metFORMIN (GLUCOPHAGE) 500 MG tablet; Take 1 tablet (500 mg total) by mouth 2 (two) times daily with a meal.  Other orders -     Blood Pressure Monitor KIT; 1 kit by Does  not apply route 3 (three) times daily as needed. -     SYMBICORT 160-4.5 MCG/ACT inhaler; Inhale 2 puffs into the lungs 2 (two) times daily.    Follow Up Instructions:    I discussed the assessment and treatment plan with the patient. The patient was provided an opportunity to ask questions and all were answered. The patient agreed with the plan and demonstrated an understanding of the instructions.   The patient was advised to call back or seek an in-person evaluation if the symptoms worsen or if the condition fails to improve as anticipated.  I provided 12 minutes of non-face-to-face time during this encounter.   Kerin Perna, NP

## 2019-11-13 ENCOUNTER — Telehealth (INDEPENDENT_AMBULATORY_CARE_PROVIDER_SITE_OTHER): Payer: Self-pay

## 2019-11-13 NOTE — Telephone Encounter (Signed)
Patient called to make a medication refill for  albuterol (PROVENTIL,VENTOLIN) solution continuous neb      Patient uses  [id number] - [place], [place] - [address]

## 2019-11-14 NOTE — Telephone Encounter (Signed)
FWD to PCP

## 2019-11-17 ENCOUNTER — Other Ambulatory Visit (INDEPENDENT_AMBULATORY_CARE_PROVIDER_SITE_OTHER): Payer: Self-pay | Admitting: Primary Care

## 2019-11-17 DIAGNOSIS — J4540 Moderate persistent asthma, uncomplicated: Secondary | ICD-10-CM

## 2019-11-17 MED ORDER — ALBUTEROL SULFATE HFA 108 (90 BASE) MCG/ACT IN AERS: 1.0000 | INHALATION_SPRAY | Freq: Four times a day (QID) | RESPIRATORY_TRACT | 2 refills | Status: DC | PRN

## 2019-11-17 NOTE — Telephone Encounter (Signed)
Reviewed orders and history has not used nebulizer in a while which indicates to me symptoms has become worst. Will refer to pumonologist

## 2019-11-18 NOTE — Telephone Encounter (Signed)
Patient is aware that he has been referred to pulmonologist. Records indicate nebulizer solution has not been prescribed in some time. Specialist will assess need. He verbalized understanding.

## 2019-12-27 ENCOUNTER — Ambulatory Visit (INDEPENDENT_AMBULATORY_CARE_PROVIDER_SITE_OTHER): Payer: Medicare HMO

## 2019-12-27 ENCOUNTER — Ambulatory Visit (HOSPITAL_COMMUNITY)
Admission: EM | Admit: 2019-12-27 | Discharge: 2019-12-27 | Disposition: A | Discharge status: Home / Self Care | Payer: Medicare HMO | Attending: Family Medicine | Admitting: Family Medicine

## 2019-12-27 ENCOUNTER — Encounter (HOSPITAL_COMMUNITY): Payer: Self-pay

## 2019-12-27 ENCOUNTER — Other Ambulatory Visit: Payer: Self-pay

## 2019-12-27 DIAGNOSIS — R0602 Shortness of breath: Secondary | ICD-10-CM | POA: Diagnosis not present

## 2019-12-27 DIAGNOSIS — J441 Chronic obstructive pulmonary disease with (acute) exacerbation: Secondary | ICD-10-CM | POA: Diagnosis not present

## 2019-12-27 DIAGNOSIS — J4540 Moderate persistent asthma, uncomplicated: Secondary | ICD-10-CM

## 2019-12-27 MED ORDER — DEXAMETHASONE SODIUM PHOSPHATE 10 MG/ML IJ SOLN
INTRAMUSCULAR | Status: AC
  Filled 2019-12-27: qty 1

## 2019-12-27 MED ORDER — IPRATROPIUM-ALBUTEROL 0.5-2.5 (3) MG/3ML IN SOLN: 3.0000 mL | RESPIRATORY_TRACT | 1 refills | Status: DC | PRN

## 2019-12-27 MED ORDER — DEXAMETHASONE SODIUM PHOSPHATE 10 MG/ML IJ SOLN
10.0000 mg | Freq: Once | INTRAMUSCULAR | Status: AC
  Administered 2019-12-27: 10 mg via INTRAMUSCULAR

## 2019-12-27 MED ORDER — PREDNISONE 10 MG (21) PO TBPK: ORAL_TABLET | Freq: Every day | ORAL | 0 refills | Status: AC

## 2019-12-27 MED ORDER — ALBUTEROL SULFATE HFA 108 (90 BASE) MCG/ACT IN AERS: 2.0000 | INHALATION_SPRAY | RESPIRATORY_TRACT | 1 refills | Status: DC | PRN

## 2019-12-27 NOTE — Discharge Instructions (Addendum)
Shortness of breath related to your COPD.  Your chest x-ray today was normal.  I will send in refills for your albuterol inhaler, and the solution for your nebulizer.  You have also gotten a steroid shot in our office today.  I will also send in prednisone taper for you to start tomorrow.  Follow-up with your primary care if symptoms are persisting.  Go to the ER for further shortening of breath high fever, or any other concerning symptoms.

## 2019-12-27 NOTE — ED Triage Notes (Signed)
Pt present SOB, symptoms started two days ago. Pt states he needs a refill on his medication for his COPD.

## 2019-12-27 NOTE — ED Provider Notes (Signed)
[facility]    CSN: [id number] Arrival date & time: 12/27/19  1009      History   Chief Complaint Chief Complaint  Patient presents with  . Shortness of Breath    HPI Evan Brock is a 69 y.o. male.   Reports shortness of breath x5 days.  Reports that he has COPD, and this is not unusual for him.  Reports that he needs inhaler and nebulizer solution refills today.  Reports that he has been out for "a while."  Denies fever, headache, body aches, nausea, vomiting, diarrhea, chills, rash, other symptoms.  ROS Per HPI  The history is provided by the patient.    Past Medical History:  Diagnosis Date  . Arthritis    "hips" (10/01/2013)  . Asthma   . COPD (chronic obstructive pulmonary disease) ([place])   . Exertional shortness of breath   . Hypertension     Patient Active Problem List   Diagnosis Date Noted  . Leukocytosis 10/04/2015  . COPD exacerbation ([place]) 10/01/2015  . Essential hypertension, benign 03/01/2015  . Asthma with acute exacerbation 01/26/2015  . Thrombocytopenia, unspecified ([place]) 11/06/2013  . Acute respiratory failure ([place]) with hypoxia 10/01/2013  . Acute renal failure ([place]) 10/01/2013    Past Surgical History:  Procedure Laterality Date  . JOINT REPLACEMENT    . TOTAL HIP ARTHROPLASTY Right 2010  . TOTAL HIP ARTHROPLASTY Left 08/19/2013   Procedure: TOTAL HIP ARTHROPLASTY ANTERIOR APPROACH;  Surgeon: Hessie Dibble, MD;  Location: [facility];  Service: Orthopedics;  Laterality: Left;       Home Medications    Prior to Admission medications   Medication Sig Start Date End Date Taking? Authorizing Provider  albuterol (VENTOLIN HFA) 108 (90 Base) MCG/ACT inhaler Inhale 2 puffs into the lungs every 4 (four) hours as needed for wheezing or shortness of breath. 12/27/19   Faustino Congress, NP  amLODipine (NORVASC) 10 MG tablet Take 1 tablet (10 mg total) by mouth daily. 11/05/19   Kerin Perna, NP  atenolol (TENORMIN) 50 MG tablet  Take 1 tablet (50 mg total) by mouth daily. 11/05/19   Kerin Perna, NP  atorvastatin (LIPITOR) 40 MG tablet Take 1 tablet (40 mg total) by mouth daily. 11/05/19   Kerin Perna, NP  Blood Pressure Monitor KIT 1 kit by Does not apply route 3 (three) times daily as needed. 11/05/19   Kerin Perna, NP  cloNIDine (CATAPRES) 0.1 MG tablet Take 1 tablet (0.1 mg total) by mouth daily. 11/05/19   Kerin Perna, NP  gabapentin (NEURONTIN) 300 MG capsule Take 1 capsule (300 mg total) by mouth 3 (three) times daily. 11/05/19   Kerin Perna, NP  ipratropium-albuterol (DUONEB) 0.5-2.5 (3) MG/3ML SOLN Take 3 mLs by nebulization every 4 (four) hours as needed. 12/27/19   Faustino Congress, NP  losartan (COZAAR) 50 MG tablet Take 1 tablet (50 mg total) by mouth daily. 11/05/19   Kerin Perna, NP  metFORMIN (GLUCOPHAGE) 500 MG tablet Take 1 tablet (500 mg total) by mouth 2 (two) times daily with a meal. 11/05/19   Kerin Perna, NP  predniSONE (STERAPRED UNI-PAK 21 TAB) 10 MG (21) TBPK tablet Take by mouth daily for 6 days. Take 6 tablets on day 1, 5 tablets on day 2, 4 tablets on day 3, 3 tablets on day 4, 2 tablets on day 5, 1 tablet on day 6 12/27/19 01/02/20  Faustino Congress, NP  SYMBICORT 160-4.5 MCG/ACT inhaler Inhale 2  puffs into the lungs 2 (two) times daily. 11/05/19   Kerin Perna, NP  tadalafil (CIALIS) 20 MG tablet Take 0.5-1 tablets (10-20 mg total) by mouth every other day as needed for erectile dysfunction. 08/05/19   Kerin Perna, NP  terazosin (HYTRIN) 2 MG capsule Take 1 capsule (2 mg total) by mouth daily. 11/05/19   Kerin Perna, NP  tiZANidine (ZANAFLEX) 4 MG capsule Take 4 mg by mouth 3 (three) times daily. 07/25/19   [provider]    Family History Family History  Problem Relation Age of Onset  . Diabetes Mother   . Hypertension Father   . Lupus Sister     Social History Social History   Tobacco Use  . Smoking  status: Former Smoker    Packs/day: 0.00    Years: 0.00    Pack years: 0.00    Types: Cigarettes    Quit date: 10/23/1990    Years since quitting: 29.1  . Smokeless tobacco: Never Used  . Tobacco comment: 10/01/2013 "quit smoking cigarettes~ 15-20 yr ago"  Substance Use Topics  . Alcohol use: Yes    Alcohol/week: 12.0 standard drinks    Types: 12 Cans of beer per week    Comment: 08/19/2013 "3 times/wk; 3-4 beers/night"  . Drug use: Yes    Types: Marijuana    Comment: 10/01/2013 "last drug use was 10 yr ago"     Allergies   Patient has no known allergies.   Review of Systems Review of Systems   Physical Exam Triage Vital Signs ED Triage Vitals [12/27/19 1035]  Enc Vitals Group     BP (!) 166/98     Pulse Rate 64     Resp 20     Temp 97.8 F (36.6 C)     Temp Source Oral     SpO2 96 %     Weight      Height      Head Circumference      Peak Flow      Pain Score 0     Pain Loc      Pain Edu?      Excl. in [place]?    No data found.  Updated Vital Signs BP (!) 166/98 (BP Location: Left Arm)   Pulse 64   Temp 97.8 F (36.6 C) (Oral)   Resp 20   SpO2 96%     Physical Exam Vitals and nursing note reviewed.  Constitutional:      General: He is not in acute distress.    Appearance: He is well-developed.  HENT:     Head: Normocephalic and atraumatic.  Eyes:     Conjunctiva/sclera: Conjunctivae normal.  Cardiovascular:     Rate and Rhythm: Normal rate and regular rhythm.     Heart sounds: Normal heart sounds. No murmur.  Pulmonary:     Effort: Pulmonary effort is normal. No respiratory distress.     Breath sounds: Examination of the right-middle field reveals decreased breath sounds. Examination of the left-middle field reveals decreased breath sounds. Examination of the right-lower field reveals decreased breath sounds. Examination of the left-lower field reveals decreased breath sounds. Decreased breath sounds present.  Chest:     Chest wall: No mass,  deformity, tenderness, crepitus or edema.  Abdominal:     Palpations: Abdomen is soft.     Tenderness: There is no abdominal tenderness.  Musculoskeletal:        General: Normal range of motion.  Cervical back: Neck supple.  Skin:    General: Skin is warm and dry.     Capillary Refill: Capillary refill takes less than 2 seconds.  Neurological:     General: No focal deficit present.     Mental Status: He is alert and oriented to person, place, and time.  Psychiatric:        Mood and Affect: Mood normal.      UC Treatments / Results  Labs (all labs ordered are listed, but only abnormal results are displayed) Labs Reviewed - No data to display  EKG   Radiology DG Chest 2 View  Result Date: 12/27/2019 CLINICAL DATA:  Shortness of breath EXAM: CHEST - 2 VIEW COMPARISON:  2019 FINDINGS: Minor chronic interstitial prominence. No new consolidation or edema. No pleural effusion or pneumothorax. Cardiomediastinal contours are within normal limits. There is no acute osseous abnormality. IMPRESSION: No acute process in the chest. Electronically Signed   By: Macy Mis M.D.   On: 12/27/2019 11:10    Procedures Procedures (including critical care time)  Medications Ordered in UC Medications  dexamethasone (DECADRON) injection 10 mg (has no administration in time range)    Initial Impression / Assessment and Plan / UC Course  I have reviewed the triage vital signs and the nursing notes.  Pertinent labs & imaging results that were available during my care of the patient were reviewed by me and considered in my medical decision making (see chart for details).     Presents with shortness of breath x5 days.  Chest x-ray today was negative.  Will refill albuterol inhaler and DuoNeb solution for his nebulizer.  Decadron 10 mg IM given in office today.  Also sent in prednisone taper to start tomorrow.  Instructed to follow-up with primary care as needed instructed to go to the ED for  furthering of shortness of breath, high fever, or other concerning symptoms.   Final Clinical Impressions(s) / UC Diagnoses   Final diagnoses:  COPD exacerbation (HCC)  Shortness of breath     Discharge Instructions     Shortness of breath related to your COPD.  Your chest x-ray today was normal.  I will send in refills for your albuterol inhaler, and the solution for your nebulizer.  You have also gotten a steroid shot in our office today.  I will also send in prednisone taper for you to start tomorrow.  Follow-up with your primary care if symptoms are persisting.  Go to the ER for further shortening of breath high fever, or any other concerning symptoms.    ED Prescriptions    Medication Sig Dispense Auth. Provider   albuterol (VENTOLIN HFA) 108 (90 Base) MCG/ACT inhaler Inhale 2 puffs into the lungs every 4 (four) hours as needed for wheezing or shortness of breath. 6.7 g Faustino Congress, NP   ipratropium-albuterol (DUONEB) 0.5-2.5 (3) MG/3ML SOLN Take 3 mLs by nebulization every 4 (four) hours as needed. 360 mL Faustino Congress, NP   predniSONE (STERAPRED UNI-PAK 21 TAB) 10 MG (21) TBPK tablet Take by mouth daily for 6 days. Take 6 tablets on day 1, 5 tablets on day 2, 4 tablets on day 3, 3 tablets on day 4, 2 tablets on day 5, 1 tablet on day 6 21 tablet Faustino Congress, NP     PDMP not reviewed this encounter.   Faustino Congress, NP 12/27/19 1121

## 2020-01-13 MED FILL — SYMBICORT 160-4.5 MCG INH: 160-4.5 | 30 days supply | Qty: 10 | Fill #1

## 2020-01-13 MED FILL — PROAIR HFA 90 MCG INHALER: 108 (90 BAS | 50 days supply | Qty: 9 | Fill #1

## 2020-02-05 ENCOUNTER — Other Ambulatory Visit: Payer: Self-pay

## 2020-02-05 ENCOUNTER — Ambulatory Visit (HOSPITAL_COMMUNITY)
Admission: EM | Admit: 2020-02-05 | Discharge: 2020-02-05 | Disposition: A | Discharge status: Home / Self Care | Payer: Medicare HMO | Attending: Family Medicine | Admitting: Family Medicine

## 2020-02-05 ENCOUNTER — Encounter (HOSPITAL_COMMUNITY): Payer: Self-pay

## 2020-02-05 DIAGNOSIS — J441 Chronic obstructive pulmonary disease with (acute) exacerbation: Secondary | ICD-10-CM | POA: Diagnosis not present

## 2020-02-05 DIAGNOSIS — R0602 Shortness of breath: Secondary | ICD-10-CM

## 2020-02-05 MED ORDER — IPRATROPIUM-ALBUTEROL 0.5-2.5 (3) MG/3ML IN SOLN: 3.0000 mL | Freq: Four times a day (QID) | RESPIRATORY_TRACT | 1 refills | Status: DC | PRN

## 2020-02-05 MED ORDER — PREDNISONE 20 MG PO TABS: 40.0000 mg | ORAL_TABLET | Freq: Every day | ORAL | 0 refills | Status: AC

## 2020-02-05 NOTE — ED Triage Notes (Signed)
Patient presents with shortness of breath. Hx COPD. Reports this feels like a flare up of his COPD.

## 2020-02-05 NOTE — ED Provider Notes (Signed)
[place] [facility]    CSN: [id number] Arrival date & time: 02/05/20  1203      History   Chief Complaint Shortness of Breath   HPI Evan Brock is a 69 y.o. male.   HPI  Patient with a medical history significant for COPD, presents for evaluation of shortness of breath.  Patient was last seen here over a month ago for an acute COPD exacerbation.  He reports he did recover from that episode of shortness of breath but has noticed increased work of breathing when outside and with activity especially.  He reports symptoms do improve somewhat with albuterol and nebulizer treatments.  He denies any wheezing, chest tightness, or coughing.  He denies any lower extremity swelling and endorses taking his blood pressure medication this morning.  Blood pressure is slightly high on arrival today.  He did recently administered a DuoNeb treatment and reports improved work of breathing since last nebulizer. Past Medical History:  Diagnosis Date  . Arthritis    "hips" (10/01/2013)  . Asthma   . COPD (chronic obstructive pulmonary disease) ([place])   . Exertional shortness of breath   . Hypertension     Patient Active Problem List   Diagnosis Date Noted  . Leukocytosis 10/04/2015  . COPD exacerbation ([place]) 10/01/2015  . Essential hypertension, benign 03/01/2015  . Asthma with acute exacerbation 01/26/2015  . Thrombocytopenia, unspecified ([place]) 11/06/2013  . Acute respiratory failure ([place]) with hypoxia 10/01/2013  . Acute renal failure ([place]) 10/01/2013    Past Surgical History:  Procedure Laterality Date  . JOINT REPLACEMENT    . TOTAL HIP ARTHROPLASTY Right 2010  . TOTAL HIP ARTHROPLASTY Left 08/19/2013   Procedure: TOTAL HIP ARTHROPLASTY ANTERIOR APPROACH;  Surgeon: Hessie Dibble, MD;  Location: [facility];  Service: Orthopedics;  Laterality: Left;     Home Medications    Prior to Admission medications   Medication Sig Start Date End Date Taking? Authorizing Provider  albuterol  (VENTOLIN HFA) 108 (90 Base) MCG/ACT inhaler Inhale 2 puffs into the lungs every 4 (four) hours as needed for wheezing or shortness of breath. 12/27/19   Faustino Congress, NP  amLODipine (NORVASC) 10 MG tablet Take 1 tablet (10 mg total) by mouth daily. 11/05/19   Kerin Perna, NP  atenolol (TENORMIN) 50 MG tablet Take 1 tablet (50 mg total) by mouth daily. 11/05/19   Kerin Perna, NP  atorvastatin (LIPITOR) 40 MG tablet Take 1 tablet (40 mg total) by mouth daily. 11/05/19   Kerin Perna, NP  Blood Pressure Monitor KIT 1 kit by Does not apply route 3 (three) times daily as needed. 11/05/19   Kerin Perna, NP  cloNIDine (CATAPRES) 0.1 MG tablet Take 1 tablet (0.1 mg total) by mouth daily. 11/05/19   Kerin Perna, NP  gabapentin (NEURONTIN) 300 MG capsule Take 1 capsule (300 mg total) by mouth 3 (three) times daily. 11/05/19   Kerin Perna, NP  ipratropium-albuterol (DUONEB) 0.5-2.5 (3) MG/3ML SOLN Take 3 mLs by nebulization every 4 (four) hours as needed. 12/27/19   Faustino Congress, NP  losartan (COZAAR) 50 MG tablet Take 1 tablet (50 mg total) by mouth daily. 11/05/19   Kerin Perna, NP  metFORMIN (GLUCOPHAGE) 500 MG tablet Take 1 tablet (500 mg total) by mouth 2 (two) times daily with a meal. 11/05/19   Kerin Perna, NP  SYMBICORT 160-4.5 MCG/ACT inhaler Inhale 2 puffs into the lungs 2 (two) times daily. 11/05/19   Juluis Mire  P, NP  tadalafil (CIALIS) 20 MG tablet Take 0.5-1 tablets (10-20 mg total) by mouth every other day as needed for erectile dysfunction. 08/05/19   Kerin Perna, NP  terazosin (HYTRIN) 2 MG capsule Take 1 capsule (2 mg total) by mouth daily. 11/05/19   Kerin Perna, NP  tiZANidine (ZANAFLEX) 4 MG capsule Take 4 mg by mouth 3 (three) times daily. 07/25/19   [provider]    Family History Family History  Problem Relation Age of Onset  . Diabetes Mother   . Hypertension Father   . Lupus Sister      Social History Social History   Tobacco Use  . Smoking status: Former Smoker    Packs/day: 0.00    Years: 0.00    Pack years: 0.00    Types: Cigarettes    Quit date: 10/23/1990    Years since quitting: 29.3  . Smokeless tobacco: Never Used  . Tobacco comment: 10/01/2013 "quit smoking cigarettes~ 15-20 yr ago"  Substance Use Topics  . Alcohol use: Yes    Alcohol/week: 12.0 standard drinks    Types: 12 Cans of beer per week    Comment: 08/19/2013 "3 times/wk; 3-4 beers/night"  . Drug use: Yes    Types: Marijuana    Comment: 10/01/2013 "last drug use was 10 yr ago"     Allergies   Patient has no known allergies. Review of Systems Review of Systems Pertinent negatives listed in HPI Physical Exam Triage Vital Signs ED Triage Vitals  Enc Vitals Group     BP 02/05/20 1209 (!) 156/85     Pulse Rate 02/05/20 1209 87     Resp 02/05/20 1209 (!) 22     Temp 02/05/20 1209 98.2 F (36.8 C)     Temp Source 02/05/20 1209 Oral     SpO2 02/05/20 1209 96 %     Weight --      Height --      Head Circumference --      Peak Flow --      Pain Score 02/05/20 1313 0     Pain Loc --      Pain Edu? --      Excl. in [place]? --    No data found.  Updated Vital Signs BP (!) 156/85 (BP Location: Right Arm)   Pulse 87   Temp 98.2 F (36.8 C) (Oral)   Resp (!) 22   SpO2 96%   Visual Acuity Right Eye Distance:   Left Eye Distance:   Bilateral Distance:    Right Eye Near:   Left Eye Near:    Bilateral Near:     Physical Exam Vitals reviewed.  Constitutional:      Appearance: He is not ill-appearing.  HENT:     Head: Normocephalic.     Right Ear: Tympanic membrane normal.     Left Ear: Tympanic membrane normal.  Cardiovascular:     Rate and Rhythm: Normal rate.  Pulmonary:     Effort: Pulmonary effort is normal.     Breath sounds: Decreased breath sounds present. No rhonchi.  Musculoskeletal:     Cervical back: Normal range of motion.  Lymphadenopathy:     Cervical: No  cervical adenopathy.  Skin:    General: Skin is warm and dry.  Neurological:     Mental Status: He is alert.  Psychiatric:        Attention and Perception: Attention normal.        Mood and  Affect: Mood normal.        UC Treatments / Results  Labs (all labs ordered are listed, but only abnormal results are displayed) Labs Reviewed - No data to display  EKG   Radiology No results found.  Procedures Procedures (including critical care time)  Medications Ordered in UC Medications - No data to display  Initial Impression / Assessment and Plan / UC Course  I have reviewed the triage vital signs and the nursing notes.  Pertinent labs & imaging results that were available during my care of the patient were reviewed by me and considered in my medical decision making (see chart for details).     1. COPD exacerbation ([place]) 2.Shortness of breath Refilled DuoNeb. Start prednisone 40 mg daily with breakfast x 5 days Red flags discussed.  Patient advised if symptoms do not resolve or worsen -return for follow-up Final Clinical Impressions(s) / UC Diagnoses   Final diagnoses:  COPD exacerbation ([place])  Shortness of breath   Discharge Instructions   None    ED Prescriptions    Medication Sig Dispense Auth. Provider   ipratropium-albuterol (DUONEB) 0.5-2.5 (3) MG/3ML SOLN Take 3 mLs by nebulization every 6 (six) hours as needed. 360 mL Scot Jun, FNP   predniSONE (DELTASONE) 20 MG tablet Take 2 tablets (40 mg total) by mouth daily with breakfast for 5 days. 10 tablet Scot Jun, FNP     PDMP not reviewed this encounter.   Scot Jun, FNP 02/07/20 2112

## 2020-02-25 ENCOUNTER — Other Ambulatory Visit: Payer: Self-pay

## 2020-02-25 ENCOUNTER — Encounter (HOSPITAL_COMMUNITY): Payer: Self-pay | Admitting: Emergency Medicine

## 2020-02-25 ENCOUNTER — Ambulatory Visit (INDEPENDENT_AMBULATORY_CARE_PROVIDER_SITE_OTHER): Payer: Medicare HMO

## 2020-02-25 ENCOUNTER — Ambulatory Visit (HOSPITAL_COMMUNITY)
Admission: EM | Admit: 2020-02-25 | Discharge: 2020-02-25 | Disposition: A | Discharge status: Home Health | Payer: Medicare HMO | Attending: Urgent Care | Admitting: Urgent Care

## 2020-02-25 DIAGNOSIS — R0602 Shortness of breath: Secondary | ICD-10-CM | POA: Diagnosis not present

## 2020-02-25 DIAGNOSIS — R05 Cough: Secondary | ICD-10-CM | POA: Diagnosis not present

## 2020-02-25 DIAGNOSIS — J449 Chronic obstructive pulmonary disease, unspecified: Secondary | ICD-10-CM

## 2020-02-25 DIAGNOSIS — J40 Bronchitis, not specified as acute or chronic: Secondary | ICD-10-CM

## 2020-02-25 MED ORDER — METHYLPREDNISOLONE ACETATE 80 MG/ML IJ SUSP
INTRAMUSCULAR | Status: AC
  Filled 2020-02-25: qty 1

## 2020-02-25 MED ORDER — METHYLPREDNISOLONE ACETATE 80 MG/ML IJ SUSP
80.0000 mg | Freq: Once | INTRAMUSCULAR | Status: AC
  Administered 2020-02-25: 80 mg via INTRAMUSCULAR

## 2020-02-25 NOTE — ED Triage Notes (Signed)
Seen 3 weeks ago for shortness of breath, here for same. History of COPD.   Shortness of breath worse with exertion. Denies chest pain.

## 2020-02-25 NOTE — ED Provider Notes (Signed)
[facility]   MRN: [id number] DOB: 11/21/1950  Subjective:   Evan Brock is a 69 y.o. male presenting for 2-week history of recurrent and persistent shortness of breath with exertion.  Patient has a history of COPD, states that he used to smoke but his diagnosis generally comes from the work that he did in his career.  Denies fever, sinus congestion, throat pain, chest pain, belly pain.  Patient is only using albuterol either his inhaler or nebulized.  Does not have a pulmonologist.  Blood sugars well controlled, last a1c was 5.8% on 08/05/2019.  No current facility-administered medications for this encounter.  Current Outpatient Medications:  .  albuterol (VENTOLIN HFA) 108 (90 Base) MCG/ACT inhaler, Inhale 2 puffs into the lungs every 4 (four) hours as needed for wheezing or shortness of breath., Disp: 6.7 g, Rfl: 1 .  amLODipine (NORVASC) 10 MG tablet, Take 1 tablet (10 mg total) by mouth daily., Disp: 90 tablet, Rfl: 1 .  atenolol (TENORMIN) 50 MG tablet, Take 1 tablet (50 mg total) by mouth daily., Disp: 90 tablet, Rfl: 1 .  atorvastatin (LIPITOR) 40 MG tablet, Take 1 tablet (40 mg total) by mouth daily., Disp: 90 tablet, Rfl: 1 .  Blood Pressure Monitor KIT, 1 kit by Does not apply route 3 (three) times daily as needed., Disp: 1 kit, Rfl: 0 .  cloNIDine (CATAPRES) 0.1 MG tablet, Take 1 tablet (0.1 mg total) by mouth daily., Disp: 90 tablet, Rfl: 1 .  gabapentin (NEURONTIN) 300 MG capsule, Take 1 capsule (300 mg total) by mouth 3 (three) times daily., Disp: 90 capsule, Rfl: 1 .  ipratropium-albuterol (DUONEB) 0.5-2.5 (3) MG/3ML SOLN, Take 3 mLs by nebulization every 6 (six) hours as needed., Disp: 360 mL, Rfl: 1 .  losartan (COZAAR) 50 MG tablet, Take 1 tablet (50 mg total) by mouth daily., Disp: 90 tablet, Rfl: 1 .  metFORMIN (GLUCOPHAGE) 500 MG tablet, Take 1 tablet (500 mg total) by mouth 2 (two) times daily with a meal., Disp: 180 tablet, Rfl: 1 .  SYMBICORT 160-4.5 MCG/ACT  inhaler, Inhale 2 puffs into the lungs 2 (two) times daily., Disp: 3 Inhaler, Rfl: 11 .  tadalafil (CIALIS) 20 MG tablet, Take 0.5-1 tablets (10-20 mg total) by mouth every other day as needed for erectile dysfunction., Disp: 5 tablet, Rfl: 3 .  terazosin (HYTRIN) 2 MG capsule, Take 1 capsule (2 mg total) by mouth daily., Disp: 90 capsule, Rfl: 1 .  tiZANidine (ZANAFLEX) 4 MG capsule, Take 4 mg by mouth 3 (three) times daily., Disp: , Rfl:    No Known Allergies  Past Medical History:  Diagnosis Date  . Arthritis    "hips" (10/01/2013)  . Asthma   . COPD (chronic obstructive pulmonary disease) ([place])   . Exertional shortness of breath   . Hypertension      Past Surgical History:  Procedure Laterality Date  . JOINT REPLACEMENT    . TOTAL HIP ARTHROPLASTY Right 2010  . TOTAL HIP ARTHROPLASTY Left 08/19/2013   Procedure: TOTAL HIP ARTHROPLASTY ANTERIOR APPROACH;  Surgeon: Hessie Dibble, MD;  Location: [facility];  Service: Orthopedics;  Laterality: Left;    Family History  Problem Relation Age of Onset  . Diabetes Mother   . Hypertension Father   . Lupus Sister     Social History   Tobacco Use  . Smoking status: Former Smoker    Packs/day: 0.00    Years: 0.00    Pack years: 0.00    Types:  Cigarettes    Quit date: 10/23/1990    Years since quitting: 29.3  . Smokeless tobacco: Never Used  . Tobacco comment: 10/01/2013 "quit smoking cigarettes~ 15-20 yr ago"  Substance Use Topics  . Alcohol use: Yes    Alcohol/week: 12.0 standard drinks    Types: 12 Cans of beer per week    Comment: 08/19/2013 "3 times/wk; 3-4 beers/night"  . Drug use: Yes    Types: Marijuana    Comment: 10/01/2013 "last drug use was 10 yr ago"    ROS   Objective:   Vitals: BP (!) 154/89   Pulse 82   Temp 98 F (36.7 C) (Oral)   Resp 16   SpO2 94%   Pulse oximetry fluctuated between 94-96% during his visit.   Physical Exam Constitutional:      General: He is not in acute distress.     Appearance: Normal appearance. He is well-developed. He is not ill-appearing, toxic-appearing or diaphoretic.  HENT:     Head: Normocephalic and atraumatic.     Right Ear: External ear normal.     Left Ear: External ear normal.     Nose: Nose normal.     Mouth/Throat:     Mouth: Mucous membranes are moist.     Pharynx: Oropharynx is clear.  Eyes:     General: No scleral icterus.    Extraocular Movements: Extraocular movements intact.     Pupils: Pupils are equal, round, and reactive to light.  Cardiovascular:     Rate and Rhythm: Normal rate and regular rhythm.     Heart sounds: Normal heart sounds. No murmur. No friction rub. No gallop.   Pulmonary:     Effort: Pulmonary effort is normal. No respiratory distress.     Breath sounds: No stridor. Rhonchi (coarse lung sounds throughout) present. No wheezing or rales.  Neurological:     Mental Status: He is alert and oriented to person, place, and time.  Psychiatric:        Mood and Affect: Mood normal.        Behavior: Behavior normal.        Thought Content: Thought content normal.     DG Chest 2 View  Result Date: 02/25/2020 CLINICAL DATA:  Cough and short of breath EXAM: CHEST - 2 VIEW COMPARISON:  12/27/2019 FINDINGS: Hyperinflation with mild bronchitic changes. No focal opacity or pleural effusion. Stable cardiomediastinal silhouette. No pneumothorax. IMPRESSION: Mild bronchitic changes. No focal pulmonary infiltrate. Electronically Signed   By: Donavan Foil M.D.   On: 02/25/2020 16:19   Assessment and Plan :   PDMP not reviewed this encounter.  1. Shortness of breath   2. Bronchitis   3. Chronic obstructive pulmonary disease, unspecified COPD type ([place])     Will use IM Solu-Medrol, close follow-up and strict ER precautions.  Maintain albuterol treatments at home.  Will hold off on using antibiotics given lack of focal infiltrate.  Provided patient with information to [facility] pulmonology for consult regarding long-term  management of his COPD/restrictive lung disorder.  Patient is not Covid vaccinated, refused Covid testing today.  Counseled patient on potential for adverse effects with medications prescribed/recommended today, ER and return-to-clinic precautions discussed, patient verbalized understanding.    Jaynee Eagles, [place] 02/25/20 1909

## 2020-02-26 MED FILL — ALBUTEROL SULFATE HFA 108 (: 108 (90 BAS | 25 days supply | Qty: 9 | Fill #2

## 2020-03-12 ENCOUNTER — Other Ambulatory Visit: Payer: Self-pay | Admitting: Physician Assistant

## 2020-03-12 MED FILL — SYMBICORT 160-4.5 MCG INH: 160-4.5 | 30 days supply | Qty: 10 | Fill #0

## 2020-03-15 ENCOUNTER — Other Ambulatory Visit (INDEPENDENT_AMBULATORY_CARE_PROVIDER_SITE_OTHER): Payer: Self-pay | Admitting: Primary Care

## 2020-03-15 DIAGNOSIS — M542 Cervicalgia: Secondary | ICD-10-CM

## 2020-03-15 NOTE — Telephone Encounter (Signed)
Sent to PCP ?

## 2020-04-07 ENCOUNTER — Institutional Professional Consult (permissible substitution): Payer: Medicare HMO | Admitting: Critical Care Medicine

## 2020-04-07 NOTE — Progress Notes (Deleted)
Synopsis: Referred in June 2021 for asthma by Kerin Perna, NP.  Subjective:   PATIENT ID: Evan Brock GENDER: male DOB: December 09, 1950, MRN: [id number]  No chief complaint on file.   HPI   Asthma: Albuterol-how often DuoNeb Symbicort  Flowsheet data:  Lab Results  Component Value Date   NITRICOXIDE TEST WILL BE CREDITED 10/01/2015    Past Medical History:  Diagnosis Date  . Arthritis    "hips" (10/01/2013)  . Asthma   . COPD (chronic obstructive pulmonary disease) ([place])   . Exertional shortness of breath   . Hypertension      Family History  Problem Relation Age of Onset  . Diabetes Mother   . Hypertension Father   . Lupus Sister      Past Surgical History:  Procedure Laterality Date  . JOINT REPLACEMENT    . TOTAL HIP ARTHROPLASTY Right 2010  . TOTAL HIP ARTHROPLASTY Left 08/19/2013   Procedure: TOTAL HIP ARTHROPLASTY ANTERIOR APPROACH;  Surgeon: Hessie Dibble, MD;  Location: [facility];  Service: Orthopedics;  Laterality: Left;    Social History   Socioeconomic History  . Marital status: Single    Spouse name: Not on file  . Number of children: Not on file  . Years of education: Not on file  . Highest education level: Not on file  Occupational History  . Not on file  Tobacco Use  . Smoking status: Former Smoker    Packs/day: 0.00    Years: 0.00    Pack years: 0.00    Types: Cigarettes    Quit date: 10/23/1990    Years since quitting: 29.4  . Smokeless tobacco: Never Used  . Tobacco comment: 10/01/2013 "quit smoking cigarettes~ 15-20 yr ago"  Substance and Sexual Activity  . Alcohol use: Yes    Alcohol/week: 12.0 standard drinks    Types: 12 Cans of beer per week    Comment: 08/19/2013 "3 times/wk; 3-4 beers/night"  . Drug use: Yes    Types: Marijuana    Comment: 10/01/2013 "last drug use was 10 yr ago"  . Sexual activity: Not Currently  Other Topics Concern  . Not on file  Social History Narrative  . Not on file   Social  Determinants of Health   Financial Resource Strain:   . Difficulty of Paying Living Expenses:   Food Insecurity:   . Worried About Charity fundraiser in the Last Year:   . Arboriculturist in the Last Year:   Transportation Needs:   . Film/video editor (Medical):   Marland Kitchen Lack of Transportation (Non-Medical):   Physical Activity:   . Days of Exercise per Week:   . Minutes of Exercise per Session:   Stress:   . Feeling of Stress :   Social Connections:   . Frequency of Communication with Friends and Family:   . Frequency of Social Gatherings with Friends and Family:   . Attends Religious Services:   . Active Member of Clubs or Organizations:   . Attends Archivist Meetings:   Marland Kitchen Marital Status:   Intimate Partner Violence:   . Fear of Current or Ex-Partner:   . Emotionally Abused:   Marland Kitchen Physically Abused:   . Sexually Abused:      No Known Allergies   Immunization History  Administered Date(s) Administered  . Influenza,inj,Quad PF,6+ Mos 07/14/2015, 08/05/2018, 08/05/2019  . Pneumococcal Conjugate-13 02/16/2014  . Pneumococcal Polysaccharide-23 11/05/2017  . Tdap 12/03/2017    Outpatient Medications  Prior to Visit  Medication Sig Dispense Refill  . albuterol (VENTOLIN HFA) 108 (90 Base) MCG/ACT inhaler Inhale 2 puffs into the lungs every 4 (four) hours as needed for wheezing or shortness of breath. 6.7 g 1  . amLODipine (NORVASC) 10 MG tablet Take 1 tablet (10 mg total) by mouth daily. 90 tablet 1  . atenolol (TENORMIN) 50 MG tablet Take 1 tablet (50 mg total) by mouth daily. 90 tablet 1  . atorvastatin (LIPITOR) 40 MG tablet Take 1 tablet (40 mg total) by mouth daily. 90 tablet 1  . Blood Pressure Monitor KIT 1 kit by Does not apply route 3 (three) times daily as needed. 1 kit 0  . cloNIDine (CATAPRES) 0.1 MG tablet Take 1 tablet (0.1 mg total) by mouth daily. 90 tablet 1  . gabapentin (NEURONTIN) 300 MG capsule TAKE 1 CAPSULE(300 MG) BY MOUTH THREE TIMES DAILY  90 capsule 1  . ipratropium-albuterol (DUONEB) 0.5-2.5 (3) MG/3ML SOLN Take 3 mLs by nebulization every 6 (six) hours as needed. 360 mL 1  . losartan (COZAAR) 50 MG tablet Take 1 tablet (50 mg total) by mouth daily. 90 tablet 1  . metFORMIN (GLUCOPHAGE) 500 MG tablet Take 1 tablet (500 mg total) by mouth 2 (two) times daily with a meal. 180 tablet 1  . SYMBICORT 160-4.5 MCG/ACT inhaler Inhale 2 puffs into the lungs 2 (two) times daily. 3 Inhaler 11  . tadalafil (CIALIS) 20 MG tablet Take 0.5-1 tablets (10-20 mg total) by mouth every other day as needed for erectile dysfunction. 5 tablet 3  . terazosin (HYTRIN) 2 MG capsule Take 1 capsule (2 mg total) by mouth daily. 90 capsule 1  . tiZANidine (ZANAFLEX) 4 MG capsule Take 4 mg by mouth 3 (three) times daily.     No facility-administered medications prior to visit.    ROS   Objective:  There were no vitals filed for this visit.   on *** LPM *** RA BMI Readings from Last 3 Encounters:  08/20/19 29.89 kg/m  08/05/19 30.63 kg/m  05/05/19 30.83 kg/m   Wt Readings from Last 3 Encounters:  08/20/19 202 lb 6.4 oz (91.8 kg)  08/05/19 207 lb 6.4 oz (94.1 kg)  05/05/19 208 lb 12.8 oz (94.7 kg)    Physical Exam   CBC    Component Value Date/Time   WBC 7.5 08/05/2019 0927   WBC 11.8 (H) 10/06/2015 0625   RBC 5.06 08/05/2019 0927   RBC 4.91 10/06/2015 0625   HGB 13.4 08/05/2019 0927   HCT 42.3 08/05/2019 0927   PLT 219 08/05/2019 0927   MCV 84 08/05/2019 0927   MCH 26.5 (L) 08/05/2019 0927   MCH 26.9 10/06/2015 0625   MCHC 31.7 08/05/2019 0927   MCHC 32.0 10/06/2015 0625   RDW 14.1 08/05/2019 0927   LYMPHSABS 1.3 08/05/2019 0927   MONOABS 1.1 (H) 10/01/2015 1710   EOSABS 0.2 08/05/2019 0927   BASOSABS 0.1 08/05/2019 0927    CHEMISTRY No results for input(s): NA, K, CL, CO2, GLUCOSE, BUN, CREATININE, CALCIUM, MG, PHOS in the last 168 hours. CrCl cannot be calculated (Patient's most recent lab result is older than the  maximum 21 days allowed.).   Chest Imaging- films reviewed: CXR, 2 view 02/25/2020-increased retrosternal airspace with mild hemidiaphragm flattening.  Prominent airways.  CTA 06/02/2013- mild basilar airway thickening with areas of mucus plugging.  Pulmonary Functions Testing Results: PFT Results Latest Ref Rng & Units 11/03/2013  FVC-Predicted Pre % 59  FVC-Post L 2.53  FVC-Predicted Post % 69  Pre FEV1/FVC % % 57  Post FEV1/FCV % % 56  FEV1-Pre L 1.24  FEV1-Predicted Pre % 44  FEV1-Post L 1.42  DLCO UNC% % 79  DLCO COR %Predicted % 115  TLC L 5.81  TLC % Predicted % 88  RV % Predicted % 137   2015- Moderate obstruction with significant bronchodilator reversibility.  No significant restriction, hyperinflation, or air trapping.  Mild diffusion impairment.  Spirometry 05/12/2015: FVC 2.2 (58%) FEV1 1.4 (47%) Ratio 62%      Assessment & Plan:     ICD-10-CM   1. Chronic obstructive pulmonary disease, unspecified COPD type ([place])  J44.9       Current Outpatient Medications:  .  albuterol (VENTOLIN HFA) 108 (90 Base) MCG/ACT inhaler, Inhale 2 puffs into the lungs every 4 (four) hours as needed for wheezing or shortness of breath., Disp: 6.7 g, Rfl: 1 .  amLODipine (NORVASC) 10 MG tablet, Take 1 tablet (10 mg total) by mouth daily., Disp: 90 tablet, Rfl: 1 .  atenolol (TENORMIN) 50 MG tablet, Take 1 tablet (50 mg total) by mouth daily., Disp: 90 tablet, Rfl: 1 .  atorvastatin (LIPITOR) 40 MG tablet, Take 1 tablet (40 mg total) by mouth daily., Disp: 90 tablet, Rfl: 1 .  Blood Pressure Monitor KIT, 1 kit by Does not apply route 3 (three) times daily as needed., Disp: 1 kit, Rfl: 0 .  cloNIDine (CATAPRES) 0.1 MG tablet, Take 1 tablet (0.1 mg total) by mouth daily., Disp: 90 tablet, Rfl: 1 .  gabapentin (NEURONTIN) 300 MG capsule, TAKE 1 CAPSULE(300 MG) BY MOUTH THREE TIMES DAILY, Disp: 90 capsule, Rfl: 1 .  ipratropium-albuterol (DUONEB) 0.5-2.5 (3) MG/3ML SOLN, Take 3 mLs by  nebulization every 6 (six) hours as needed., Disp: 360 mL, Rfl: 1 .  losartan (COZAAR) 50 MG tablet, Take 1 tablet (50 mg total) by mouth daily., Disp: 90 tablet, Rfl: 1 .  metFORMIN (GLUCOPHAGE) 500 MG tablet, Take 1 tablet (500 mg total) by mouth 2 (two) times daily with a meal., Disp: 180 tablet, Rfl: 1 .  SYMBICORT 160-4.5 MCG/ACT inhaler, Inhale 2 puffs into the lungs 2 (two) times daily., Disp: 3 Inhaler, Rfl: 11 .  tadalafil (CIALIS) 20 MG tablet, Take 0.5-1 tablets (10-20 mg total) by mouth every other day as needed for erectile dysfunction., Disp: 5 tablet, Rfl: 3 .  terazosin (HYTRIN) 2 MG capsule, Take 1 capsule (2 mg total) by mouth daily., Disp: 90 capsule, Rfl: 1 .  tiZANidine (ZANAFLEX) 4 MG capsule, Take 4 mg by mouth 3 (three) times daily., Disp: , Rfl:    I spent *** minutes on this encounter, including face to face time and non-face to face time spent reviewing records, charting, coordinating care, etc.   Julian Hy, DO [facility] 04/07/2020 10:04 AM

## 2020-05-07 ENCOUNTER — Other Ambulatory Visit (INDEPENDENT_AMBULATORY_CARE_PROVIDER_SITE_OTHER): Payer: Self-pay | Admitting: Primary Care

## 2020-05-07 DIAGNOSIS — I10 Essential (primary) hypertension: Secondary | ICD-10-CM

## 2020-05-07 NOTE — Telephone Encounter (Signed)
Requested medication (s) are due for refill today: yes  Requested medication (s) are on the active medication list: yes  Last refill:  01/31/2020  Future visit scheduled:no  Notes to clinic:  patient overdue for 6 month follow up   Requested Prescriptions  Pending Prescriptions Disp Refills   atenolol (TENORMIN) 50 MG tablet [Pharmacy Med Name: ATENOLOL 50MG  TABLETS] 90 tablet 1    Sig: TAKE 1 TABLET(50 MG) BY MOUTH DAILY      Cardiovascular:  Beta Blockers Failed - 05/07/2020  9:13 AM      Failed - Last BP in normal range    BP Readings from Last 1 Encounters:  02/25/20 (!) 154/89          Failed - Valid encounter within last 6 months    Recent Outpatient Visits           6 months ago Medication refill   [facility], NP   8 months ago Type 2 diabetes mellitus without complication, without long-term current use of insulin (HCC)   [facility], NP   9 months ago Type 2 diabetes mellitus without complication, without long-term current use of insulin (HCC)   [facility], NP   1 year ago Chronic neck pain   [facility], NP   1 year ago Medication refill   [facility] [place], NP              Passed - Last Heart Rate in normal range    Pulse Readings from Last 1 Encounters:  02/25/20 82

## 2020-05-19 ENCOUNTER — Ambulatory Visit (INDEPENDENT_AMBULATORY_CARE_PROVIDER_SITE_OTHER): Payer: Medicare Other | Admitting: Emergency Medicine

## 2020-05-19 ENCOUNTER — Other Ambulatory Visit: Payer: Self-pay

## 2020-05-19 ENCOUNTER — Encounter: Payer: Self-pay | Admitting: Emergency Medicine

## 2020-05-19 DIAGNOSIS — J449 Chronic obstructive pulmonary disease, unspecified: Secondary | ICD-10-CM | POA: Diagnosis not present

## 2020-05-19 DIAGNOSIS — Z77090 Contact with and (suspected) exposure to asbestos: Secondary | ICD-10-CM

## 2020-05-19 MED ORDER — BREZTRI AEROSPHERE 160-9-4.8 MCG/ACT IN AERO: 2.0000 | INHALATION_SPRAY | Freq: Two times a day (BID) | RESPIRATORY_TRACT | 0 refills | Status: DC

## 2020-05-19 NOTE — Addendum Note (Signed)
Addended by: Dorisann Frames R on: 05/19/2020 03:57 PM   Modules accepted: Orders

## 2020-05-19 NOTE — Progress Notes (Signed)
Subjective:    Patient ID: Evan Brock, male    DOB: Feb 15, 1951, 69 y.o.   MRN: [id number]  HPI 69 year old man, former smoker (5-8 pack years) who has been followed in our office in the past for COPD/asthma.  Previously on Breo.  He returns today for progressive exertional SOB, frequent exacerbations over the last year. Bothersome with exertion. He ran out of his symbicort about 2 weeks ago.  His current regimen is symbicort, unsure whether he is taking reliably. Uses nebulized albuterol / duoneb prn, about 3x a day.   Chest x-ray 02/25/20 reviewed by me, shows bronchitic changes without any focal infiltrate or effusion  Pulmonary function testing 11/03/2013 reviewed by me, show very severe obstruction with a positive bronchodilator response, suggestion of restriction on lung volumes due to a low residual volume, normal diffusion capacity.   Review of Systems As per [facility]  Past Medical History:  Diagnosis Date  . Arthritis    "hips" (10/01/2013)  . Asthma   . COPD (chronic obstructive pulmonary disease) ([place])   . Exertional shortness of breath   . Hypertension      Family History  Problem Relation Age of Onset  . Diabetes Mother   . Hypertension Father   . Lupus Sister      Social History   Socioeconomic History  . Marital status: Single    Spouse name: Not on file  . Number of children: Not on file  . Years of education: Not on file  . Highest education level: Not on file  Occupational History  . Not on file  Tobacco Use  . Smoking status: Former Smoker    Packs/day: 0.00    Years: 0.00    Pack years: 0.00    Types: Cigarettes    Quit date: 10/23/1990    Years since quitting: 29.5  . Smokeless tobacco: Never Used  . Tobacco comment: 10/01/2013 "quit smoking cigarettes~ 15-20 yr ago"  Substance and Sexual Activity  . Alcohol use: Yes    Alcohol/week: 12.0 standard drinks    Types: 12 Cans of beer per week    Comment: 08/19/2013 "3 times/wk; 3-4 beers/night"  .  Drug use: Yes    Types: Marijuana    Comment: 10/01/2013 "last drug use was 10 yr ago"  . Sexual activity: Not Currently  Other Topics Concern  . Not on file  Social History Narrative  . Not on file   Social Determinants of Health   Financial Resource Strain:   . Difficulty of Paying Living Expenses:   Food Insecurity:   . Worried About Charity fundraiser in the Last Year:   . Arboriculturist in the Last Year:   Transportation Needs:   . Film/video editor (Medical):   Marland Kitchen Lack of Transportation (Non-Medical):   Physical Activity:   . Days of Exercise per Week:   . Minutes of Exercise per Session:   Stress:   . Feeling of Stress :   Social Connections:   . Frequency of Communication with Friends and Family:   . Frequency of Social Gatherings with Friends and Family:   . Attends Religious Services:   . Active Member of Clubs or Organizations:   . Attends Archivist Meetings:   Marland Kitchen Marital Status:   Intimate Partner Violence:   . Fear of Current or Ex-Partner:   . Emotionally Abused:   Marland Kitchen Physically Abused:   . Sexually Abused:     Worked in [employer],  asbestos exposure From [place] originally, lived there and [place]  No Known Allergies   Outpatient Medications Prior to Visit  Medication Sig Dispense Refill  . albuterol (VENTOLIN HFA) 108 (90 Base) MCG/ACT inhaler Inhale 2 puffs into the lungs every 4 (four) hours as needed for wheezing or shortness of breath. 6.7 g 1  . amLODipine (NORVASC) 10 MG tablet Take 1 tablet (10 mg total) by mouth daily. 90 tablet 1  . atenolol (TENORMIN) 50 MG tablet Take 1 tablet (50 mg total) by mouth daily. 90 tablet 1  . atorvastatin (LIPITOR) 40 MG tablet Take 1 tablet (40 mg total) by mouth daily. 90 tablet 1  . Blood Pressure Monitor KIT 1 kit by Does not apply route 3 (three) times daily as needed. 1 kit 0  . cloNIDine (CATAPRES) 0.1 MG tablet Take 1 tablet (0.1 mg total) by mouth daily. 90 tablet 1  . gabapentin  (NEURONTIN) 300 MG capsule TAKE 1 CAPSULE(300 MG) BY MOUTH THREE TIMES DAILY 90 capsule 1  . ipratropium-albuterol (DUONEB) 0.5-2.5 (3) MG/3ML SOLN Take 3 mLs by nebulization every 6 (six) hours as needed. 360 mL 1  . losartan (COZAAR) 50 MG tablet Take 1 tablet (50 mg total) by mouth daily. 90 tablet 1  . metFORMIN (GLUCOPHAGE) 500 MG tablet Take 1 tablet (500 mg total) by mouth 2 (two) times daily with a meal. 180 tablet 1  . SYMBICORT 160-4.5 MCG/ACT inhaler Inhale 2 puffs into the lungs 2 (two) times daily. 3 Inhaler 11  . tadalafil (CIALIS) 20 MG tablet Take 0.5-1 tablets (10-20 mg total) by mouth every other day as needed for erectile dysfunction. 5 tablet 3  . terazosin (HYTRIN) 2 MG capsule Take 1 capsule (2 mg total) by mouth daily. 90 capsule 1  . tiZANidine (ZANAFLEX) 4 MG capsule Take 4 mg by mouth 3 (three) times daily.     No facility-administered medications prior to visit.        Objective:   Physical Exam Vitals:   05/19/20 1459  BP: (!) 150/76  Pulse: 86  Temp: 98.6 F (37 C)  TempSrc: Oral  SpO2: 92%  Weight: 201 lb 9.6 oz (91.4 kg)  Height: 5' 9" (1.753 m)   Gen: Pleasant, well-nourished, in no distress,  normal affect  ENT: No lesions,  mouth clear,  oropharynx clear, no postnasal drip  Neck: No JVD, no stridor  Lungs: No use of accessory muscles, no crackles or wheezing on normal respiration, soft wheeze on forced expiration  Cardiovascular: RRR, heart sounds normal, no murmur or gallops, no peripheral edema  Musculoskeletal: No deformities, no cyanosis or clubbing  Neuro: alert, awake, non focal, a bit difficult historian. Poor memory of meds, etc.   Skin: Warm, no lesions or rash      Assessment & Plan:  COPD (chronic obstructive pulmonary disease) ([place]) More labile recently.  Worsening symptoms, often happens to him during the summer.  Increased dyspnea, increased cough.  He is required frequent steroids for exacerbations.  He has been worse for  last 2 weeks and notes that he ran out of his Symbicort around that time.  I will try changing him to [facility] to see if he gets more benefit.  Follow-up with PFT next available to assess his response and to quantify his degree of obstruction.  We may need to perform a walking oximetry at that visit  History of asbestos exposure Will need to discuss timing of any future surveillance imaging at his next visit.  Baltazar Apo, MD, PhD 05/19/2020, 3:25 PM [facility] [phone] or if no answer [phone]

## 2020-05-19 NOTE — Assessment & Plan Note (Signed)
Will need to discuss timing of any future surveillance imaging at his next visit.

## 2020-05-19 NOTE — Patient Instructions (Addendum)
We will not refill Symbicort for now Please try Breztri, 2 puffs twice a day.  Rinse and gargle after using. Keep your DuoNeb available to use up to every 6 hours if you need it for shortness of breath, chest tightness, wheezing. Keep albuterol inhaler available to use 2 puffs up to every 4 hours if needed for shortness of breath, chest tightness, wheezing.  We will perform full PFT at your next office visit Follow with Dr. Delton Coombes next available with full pulmonary function testing on the same day.

## 2020-05-19 NOTE — Assessment & Plan Note (Signed)
More labile recently.  Worsening symptoms, often happens to him during the summer.  Increased dyspnea, increased cough.  He is required frequent steroids for exacerbations.  He has been worse for last 2 weeks and notes that he ran out of his Symbicort around that time.  I will try changing him to [facility] to see if he gets more benefit.  Follow-up with PFT next available to assess his response and to quantify his degree of obstruction.  We may need to perform a walking oximetry at that visit

## 2020-07-08 ENCOUNTER — Other Ambulatory Visit (HOSPITAL_COMMUNITY)
Admission: RE | Admit: 2020-07-08 | Discharge: 2020-07-08 | Disposition: A | Discharge status: Home / Self Care | Payer: Medicare HMO | Source: Ambulatory Visit | Attending: Emergency Medicine | Admitting: Emergency Medicine

## 2020-07-08 DIAGNOSIS — Z01812 Encounter for preprocedural laboratory examination: Secondary | ICD-10-CM | POA: Diagnosis present

## 2020-07-08 DIAGNOSIS — Z20822 Contact with and (suspected) exposure to covid-19: Secondary | ICD-10-CM | POA: Insufficient documentation

## 2020-07-08 LAB — SARS CORONAVIRUS 2 (TAT 6-24 HRS): SARS Coronavirus 2: NEGATIVE

## 2020-07-12 ENCOUNTER — Ambulatory Visit (INDEPENDENT_AMBULATORY_CARE_PROVIDER_SITE_OTHER): Payer: Medicare HMO | Admitting: Emergency Medicine

## 2020-07-12 ENCOUNTER — Other Ambulatory Visit: Payer: Self-pay

## 2020-07-12 ENCOUNTER — Encounter: Payer: Self-pay | Admitting: Emergency Medicine

## 2020-07-12 DIAGNOSIS — Z77090 Contact with and (suspected) exposure to asbestos: Secondary | ICD-10-CM | POA: Diagnosis not present

## 2020-07-12 DIAGNOSIS — J449 Chronic obstructive pulmonary disease, unspecified: Secondary | ICD-10-CM

## 2020-07-12 LAB — PULMONARY FUNCTION TEST
DL/VA % pred: 128 %
DL/VA: 5.25 ml/min/mmHg/L
DLCO cor % pred: 83 %
DLCO cor: 20.27 ml/min/mmHg
DLCO unc % pred: 83 %
DLCO unc: 20.27 ml/min/mmHg
FEF 25-75 Post: 0.74 L/sec
FEF 25-75 Pre: 0.48 L/sec
FEF2575-%Change-Post: 53 %
FEF2575-%Pred-Post: 32 %
FEF2575-%Pred-Pre: 21 %
FEV1-%Change-Post: 17 %
FEV1-%Pred-Post: 44 %
FEV1-%Pred-Pre: 37 %
FEV1-Post: 1.17 L
FEV1-Pre: 1 L
FEV1FVC-%Change-Post: -1 %
FEV1FVC-%Pred-Pre: 71 %
FEV6-%Change-Post: 18 %
FEV6-%Pred-Post: 63 %
FEV6-%Pred-Pre: 53 %
FEV6-Post: 2.15 L
FEV6-Pre: 1.81 L
FEV6FVC-%Change-Post: 0 %
FEV6FVC-%Pred-Post: 104 %
FEV6FVC-%Pred-Pre: 104 %
FVC-%Change-Post: 19 %
FVC-%Pred-Post: 61 %
FVC-%Pred-Pre: 51 %
FVC-Post: 2.17 L
FVC-Pre: 1.82 L
Post FEV1/FVC ratio: 54 %
Post FEV6/FVC ratio: 99 %
Pre FEV1/FVC ratio: 55 %
Pre FEV6/FVC Ratio: 99 %
RV % pred: 199 %
RV: 4.67 L
TLC % pred: 104 %
TLC: 6.91 L

## 2020-07-12 MED ORDER — BREZTRI AEROSPHERE 160-9-4.8 MCG/ACT IN AERO: 2.0000 | INHALATION_SPRAY | Freq: Two times a day (BID) | RESPIRATORY_TRACT | 0 refills | Status: DC

## 2020-07-12 NOTE — Assessment & Plan Note (Signed)
We will plan to perform a CT scan of the chest in May 2022 for surveillance since you have asbestos exposure.  We can order this and talk about the timing of this at your next visit.

## 2020-07-12 NOTE — Addendum Note (Signed)
Addended by: Marylynn Pearson on: 07/12/2020 04:37 PM   Modules accepted: Orders

## 2020-07-12 NOTE — Patient Instructions (Addendum)
Take Breztri 2 puffs twice a day.  Rinse and gargle after using. Keep your albuterol available to use 2 puffs if needed for shortness of breath, chest tightness, wheezing. Keep DuoNeb (albuterol/ipratropium) available to use 1 nebulizer treatment up to every 6 hours if needed for shortness of breath, chest tightness, wheezing. We will plan to perform a CT scan of the chest in May 2022 for surveillance since you have asbestos exposure.  We can order this and talk about the timing of this at your next visit. Follow with Dr. Delton Coombes in 6 months or sooner if any problems.

## 2020-07-12 NOTE — Progress Notes (Signed)
Full PFT performed today. °

## 2020-07-12 NOTE — Assessment & Plan Note (Signed)
Severe obstruction on PFT confirmed today, clinically severe symptoms.  He did benefit from [place] and we will plan to continue.  If his insurance does not cover adequately then we will look for a formulary substitution.  Take Breztri 2 puffs twice a day.  Rinse and gargle after using. Keep your albuterol available to use 2 puffs if needed for shortness of breath, chest tightness, wheezing. Keep DuoNeb (albuterol/ipratropium) available to use 1 nebulizer treatment up to every 6 hours if needed for shortness of breath, chest tightness, wheezing. Follow with Dr. Delton Coombes in 6 months or sooner if any problems.

## 2020-07-12 NOTE — Progress Notes (Signed)
Subjective:    Patient ID: Evan Brock, male    DOB: 1951-08-08, 69 y.o.   MRN: [id number]  HPI 69 year old man, former smoker (5-8 pack years) who has been followed in our office in the past for COPD/asthma.  Previously on Breo.  He returns today for progressive exertional SOB, frequent exacerbations over the last year. Bothersome with exertion. He ran out of his symbicort about 2 weeks ago.  His current regimen is symbicort, unsure whether he is taking reliably. Uses nebulized albuterol / duoneb prn, about 3x a day.   Chest x-ray 02/25/20 reviewed by me, shows bronchitic changes without any focal infiltrate or effusion  Pulmonary function testing 11/03/2013 reviewed by me, show very severe obstruction with a positive bronchodilator response, suggestion of restriction on lung volumes due to a low residual volume, normal diffusion capacity.  ROV 07/12/20 --this is a follow-up visit for 69 year old gentleman with a minimal tobacco history, longstanding asthma and frequent exacerbations requiring prednisone.  He has very severe obstruction based on prior pulmonary function testing.  Also with a history of asbestos exposure.  I saw him in July with progressive dyspnea and cough.  He had run out of Symbicort temporarily.  Last time I tried him on Breztri. He feels that he significantly benefited - opened him up, less SOB. Uses albuterol 3x a day right now, also Duoneb 2x a day. Ran out of the [employer]. No cough or wheeze. Still has exertional SOB.  He underwent pulmonary function testing today which I have reviewed, shows very severe obstruction with an FEV1 of 1.0, 37% predicted.  Positive bronchodilator response.  Lung volumes are hyperinflated.  Diffusion capacity is normal.  Review of Systems As per HPi  Past Medical History:  Diagnosis Date  . Arthritis    "hips" (10/01/2013)  . Asthma   . COPD (chronic obstructive pulmonary disease) ([place])   . Exertional shortness of breath   .  Hypertension      Family History  Problem Relation Age of Onset  . Diabetes Mother   . Hypertension Father   . Lupus Sister      Social History   Socioeconomic History  . Marital status: Single    Spouse name: Not on file  . Number of children: Not on file  . Years of education: Not on file  . Highest education level: Not on file  Occupational History  . Not on file  Tobacco Use  . Smoking status: Former Smoker    Packs/day: 0.00    Years: 0.00    Pack years: 0.00    Types: Cigarettes    Quit date: 10/23/1990    Years since quitting: 29.7  . Smokeless tobacco: Never Used  . Tobacco comment: 10/01/2013 "quit smoking cigarettes~ 15-20 yr ago"  Substance and Sexual Activity  . Alcohol use: Yes    Alcohol/week: 12.0 standard drinks    Types: 12 Cans of beer per week    Comment: 08/19/2013 "3 times/wk; 3-4 beers/night"  . Drug use: Yes    Types: Marijuana    Comment: 10/01/2013 "last drug use was 10 yr ago"  . Sexual activity: Not Currently  Other Topics Concern  . Not on file  Social History Narrative  . Not on file   Social Determinants of Health   Financial Resource Strain:   . Difficulty of Paying Living Expenses: Not on file  Food Insecurity:   . Worried About Charity fundraiser in the Last Year: Not on file  .  Ran Out of Food in the Last Year: Not on file  Transportation Needs:   . Lack of Transportation (Medical): Not on file  . Lack of Transportation (Non-Medical): Not on file  Physical Activity:   . Days of Exercise per Week: Not on file  . Minutes of Exercise per Session: Not on file  Stress:   . Feeling of Stress : Not on file  Social Connections:   . Frequency of Communication with Friends and Family: Not on file  . Frequency of Social Gatherings with Friends and Family: Not on file  . Attends Religious Services: Not on file  . Active Member of Clubs or Organizations: Not on file  . Attends Archivist Meetings: Not on file  . Marital  Status: Not on file  Intimate Partner Violence:   . Fear of Current or Ex-Partner: Not on file  . Emotionally Abused: Not on file  . Physically Abused: Not on file  . Sexually Abused: Not on file    Worked in [employer], asbestos exposure From [place] originally, lived there and [place]  No Known Allergies   Outpatient Medications Prior to Visit  Medication Sig Dispense Refill  . albuterol (VENTOLIN HFA) 108 (90 Base) MCG/ACT inhaler Inhale 2 puffs into the lungs every 4 (four) hours as needed for wheezing or shortness of breath. 6.7 g 1  . amLODipine (NORVASC) 10 MG tablet Take 1 tablet (10 mg total) by mouth daily. 90 tablet 1  . atenolol (TENORMIN) 50 MG tablet Take 1 tablet (50 mg total) by mouth daily. 90 tablet 1  . atorvastatin (LIPITOR) 40 MG tablet Take 1 tablet (40 mg total) by mouth daily. 90 tablet 1  . Blood Pressure Monitor KIT 1 kit by Does not apply route 3 (three) times daily as needed. 1 kit 0  . Budeson-Glycopyrrol-Formoterol (BREZTRI AEROSPHERE) 160-9-4.8 MCG/ACT AERO Inhale 2 puffs into the lungs in the morning and at bedtime. 5.9 g 0  . cloNIDine (CATAPRES) 0.1 MG tablet Take 1 tablet (0.1 mg total) by mouth daily. 90 tablet 1  . gabapentin (NEURONTIN) 300 MG capsule TAKE 1 CAPSULE(300 MG) BY MOUTH THREE TIMES DAILY 90 capsule 1  . ipratropium-albuterol (DUONEB) 0.5-2.5 (3) MG/3ML SOLN Take 3 mLs by nebulization every 6 (six) hours as needed. 360 mL 1  . losartan (COZAAR) 50 MG tablet Take 1 tablet (50 mg total) by mouth daily. 90 tablet 1  . metFORMIN (GLUCOPHAGE) 500 MG tablet Take 1 tablet (500 mg total) by mouth 2 (two) times daily with a meal. 180 tablet 1  . tadalafil (CIALIS) 20 MG tablet Take 0.5-1 tablets (10-20 mg total) by mouth every other day as needed for erectile dysfunction. 5 tablet 3  . terazosin (HYTRIN) 2 MG capsule Take 1 capsule (2 mg total) by mouth daily. 90 capsule 1  . tiZANidine (ZANAFLEX) 4 MG capsule Take 4 mg by mouth 3 (three) times daily.     . SYMBICORT 160-4.5 MCG/ACT inhaler Inhale 2 puffs into the lungs 2 (two) times daily. (Patient not taking: Reported on 07/12/2020) 3 Inhaler 11   No facility-administered medications prior to visit.        Objective:   Physical Exam Vitals:   07/12/20 1455  BP: (!) 146/82  Pulse: 95  Temp: 97.7 F (36.5 C)  TempSrc: Temporal  SpO2: 95%  Weight: 205 lb (93 kg)  Height: 5' 8"  (1.727 m)   Gen: Pleasant, well-nourished, in no distress,  normal affect  ENT: No lesions,  mouth clear,  oropharynx clear, no postnasal drip  Neck: No JVD, no stridor  Lungs: No use of accessory muscles, no crackles but he has some intermittent low pitched exp wheeze, soft wheeze on forced expiration  Cardiovascular: RRR, heart sounds normal, no murmur or gallops, no peripheral edema  Musculoskeletal: No deformities, no cyanosis or clubbing  Neuro: alert, awake, non focal, a bit difficult historian. Poor memory of meds, etc.   Skin: Warm, no lesions or rash      Assessment & Plan:  COPD (chronic obstructive pulmonary disease) (HCC) Severe obstruction on PFT confirmed today, clinically severe symptoms.  He did benefit from [place] and we will plan to continue.  If his insurance does not cover adequately then we will look for a formulary substitution.  Take Breztri 2 puffs twice a day.  Rinse and gargle after using. Keep your albuterol available to use 2 puffs if needed for shortness of breath, chest tightness, wheezing. Keep DuoNeb (albuterol/ipratropium) available to use 1 nebulizer treatment up to every 6 hours if needed for shortness of breath, chest tightness, wheezing. Follow with Dr. Lamonte Sakai in 6 months or sooner if any problems.  History of asbestos exposure We will plan to perform a CT scan of the chest in May 2022 for surveillance since you have asbestos exposure.  We can order this and talk about the timing of this at your next visit.  Baltazar Apo, MD, PhD 07/12/2020, 3:38  PM [facility] [phone] or if no answer [phone]

## 2020-08-25 MED FILL — PROAIR HFA 90 MCG INHALER: 108 (90 BAS | 30 days supply | Qty: 9 | Fill #0

## 2020-10-05 ENCOUNTER — Ambulatory Visit (HOSPITAL_COMMUNITY)
Admission: EM | Admit: 2020-10-05 | Discharge: 2020-10-05 | Disposition: A | Discharge status: Home / Self Care | Payer: Medicare HMO | Attending: Student | Admitting: Student

## 2020-10-05 ENCOUNTER — Encounter (HOSPITAL_COMMUNITY): Payer: Self-pay

## 2020-10-05 DIAGNOSIS — J441 Chronic obstructive pulmonary disease with (acute) exacerbation: Secondary | ICD-10-CM

## 2020-10-05 MED ORDER — PREDNISONE 10 MG (21) PO TBPK: ORAL_TABLET | Freq: Every day | ORAL | 0 refills | Status: DC

## 2020-10-05 MED ORDER — METHYLPREDNISOLONE SODIUM SUCC 125 MG IJ SOLR
80.0000 mg | Freq: Once | INTRAMUSCULAR | Status: AC
  Administered 2020-10-05: 80 mg via INTRAMUSCULAR

## 2020-10-05 MED ORDER — METHYLPREDNISOLONE SODIUM SUCC 125 MG IJ SOLR
INTRAMUSCULAR | Status: AC
  Filled 2020-10-05: qty 2

## 2020-10-05 NOTE — ED Provider Notes (Signed)
[facility]    CSN: [id number] Arrival date & time: 10/05/20  9211      History   Chief Complaint Chief Complaint  Patient presents with  . Shortness of Breath  . Wheezing    HPI Evan Brock is a 69 y.o. male presenting for coughing and wheezing x3 days. History of COPD and DOE; he is a former smoker, and has a history of asbestos exposure . No improvement on his OTC medications, including albuterol nebulizer and duoneb inhaler. has been seen by a pulmonologist once a few months ago and has a follow-up scheduled. Sugars are well controlled per pt, last A1c was 5.8 one year ago, and pt states this was normal at his PCP 1 month ago. Denies fever, sinus congestion, throat pain, chest pain, belly pain.    Meds- nebulizer with albuterol; duoneb inhaler. Was doing well on Breztri inhaler but his insurance did not cover this. His pulmonologist gave him samples of this at his last appointment but he's run out.   HPI  Past Medical History:  Diagnosis Date  . Arthritis    "hips" (10/01/2013)  . Asthma   . COPD (chronic obstructive pulmonary disease) ([place])   . Exertional shortness of breath   . Hypertension     Patient Active Problem List   Diagnosis Date Noted  . 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  . Leukocytosis 10/04/2015  . COPD (chronic obstructive pulmonary disease) ([place]) 10/01/2015  . Essential hypertension, benign 03/01/2015  . Asthma with acute exacerbation 01/26/2015  . Thrombocytopenia, unspecified ([place]) 11/06/2013  . Acute respiratory failure ([place]) with hypoxia 10/01/2013  . Acute renal failure ([place]) 10/01/2013    Past Surgical History:  Procedure Laterality Date  . JOINT REPLACEMENT    . TOTAL HIP ARTHROPLASTY Right 2010  . TOTAL HIP ARTHROPLASTY Left 08/19/2013   Procedure: TOTAL HIP ARTHROPLASTY ANTERIOR APPROACH;  Surgeon: Hessie Dibble, MD;  Location: [facility];  Service: Orthopedics;  Laterality: Left;       Home Medications    Prior  to Admission medications   Medication Sig Start Date End Date Taking? Authorizing Provider  albuterol (VENTOLIN HFA) 108 (90 Base) MCG/ACT inhaler Inhale 2 puffs into the lungs every 4 (four) hours as needed for wheezing or shortness of breath. 12/27/19   Faustino Congress, NP  amLODipine (NORVASC) 10 MG tablet Take 1 tablet (10 mg total) by mouth daily. 11/05/19   Kerin Perna, NP  atenolol (TENORMIN) 50 MG tablet Take 1 tablet (50 mg total) by mouth daily. 11/05/19   Kerin Perna, NP  atorvastatin (LIPITOR) 40 MG tablet Take 1 tablet (40 mg total) by mouth daily. 11/05/19   Kerin Perna, NP  Blood Pressure Monitor KIT 1 kit by Does not apply route 3 (three) times daily as needed. 11/05/19   Kerin Perna, NP  Budeson-Glycopyrrol-Formoterol (BREZTRI AEROSPHERE) 160-9-4.8 MCG/ACT AERO Inhale 2 puffs into the lungs in the morning and at bedtime. 05/19/20   Collene Gobble, MD  Budeson-Glycopyrrol-Formoterol (BREZTRI AEROSPHERE) 160-9-4.8 MCG/ACT AERO Inhale 2 puffs into the lungs in the morning and at bedtime. 07/12/20   Collene Gobble, MD  cloNIDine (CATAPRES) 0.1 MG tablet Take 1 tablet (0.1 mg total) by mouth daily. 11/05/19   Kerin Perna, NP  gabapentin (NEURONTIN) 300 MG capsule TAKE 1 CAPSULE(300 MG) BY MOUTH THREE TIMES DAILY 03/16/20   Kerin Perna, NP  ipratropium-albuterol (DUONEB) 0.5-2.5 (3) MG/3ML SOLN Take 3 mLs by nebulization every 6 (six)  hours as needed. 02/05/20   Scot Jun, FNP  losartan (COZAAR) 50 MG tablet Take 1 tablet (50 mg total) by mouth daily. 11/05/19   Kerin Perna, NP  metFORMIN (GLUCOPHAGE) 500 MG tablet Take 1 tablet (500 mg total) by mouth 2 (two) times daily with a meal. 11/05/19   Kerin Perna, NP  SYMBICORT 160-4.5 MCG/ACT inhaler Inhale 2 puffs into the lungs 2 (two) times daily. Patient not taking: Reported on 07/12/2020 11/05/19   Kerin Perna, NP  tadalafil (CIALIS) 20 MG tablet Take 0.5-1 tablets  (10-20 mg total) by mouth every other day as needed for erectile dysfunction. 08/05/19   Kerin Perna, NP  terazosin (HYTRIN) 2 MG capsule Take 1 capsule (2 mg total) by mouth daily. 11/05/19   Kerin Perna, NP  tiZANidine (ZANAFLEX) 4 MG capsule Take 4 mg by mouth 3 (three) times daily. 07/25/19   [provider]    Family History Family History  Problem Relation Age of Onset  . Diabetes Mother   . Hypertension Father   . Lupus Sister     Social History Social History   Tobacco Use  . Smoking status: Former Smoker    Packs/day: 0.00    Years: 0.00    Pack years: 0.00    Types: Cigarettes    Quit date: 10/23/1990    Years since quitting: 29.9  . Smokeless tobacco: Never Used  . Tobacco comment: 10/01/2013 "quit smoking cigarettes~ 15-20 yr ago"  Substance Use Topics  . Alcohol use: Yes    Alcohol/week: 12.0 standard drinks    Types: 12 Cans of beer per week    Comment: 08/19/2013 "3 times/wk; 3-4 beers/night"  . Drug use: Yes    Types: Marijuana    Comment: 10/01/2013 "last drug use was 10 yr ago"     Allergies   Patient has no allergy information on record.   Review of Systems Review of Systems  Constitutional: Negative for chills and fever.  HENT: Negative for ear pain, sinus pain and sore throat.   Respiratory: Positive for shortness of breath and wheezing. Negative for cough and chest tightness.   Cardiovascular: Negative for chest pain.  Gastrointestinal: Negative for diarrhea, nausea and vomiting.  All other systems reviewed and are negative.    Physical Exam Triage Vital Signs ED Triage Vitals  Enc Vitals Group     BP 10/05/20 0950 (!) 150/85     Pulse Rate 10/05/20 0950 95     Resp 10/05/20 0950 (!) 28     Temp 10/05/20 0950 98.1 F (36.7 C)     Temp Source 10/05/20 0950 Oral     SpO2 10/05/20 0950 94 %     Weight --      Height --      Head Circumference --      Peak Flow --      Pain Score 10/05/20 0948 0     Pain Loc --       Pain Edu? --      Excl. in [place]? --    No data found.  Updated Vital Signs BP (!) 150/85 (BP Location: Right Arm)   Pulse 95   Temp 98.1 F (36.7 C) (Oral)   Resp (!) 28   SpO2 94%   Visual Acuity Right Eye Distance:   Left Eye Distance:   Bilateral Distance:    Right Eye Near:   Left Eye Near:    Bilateral Near:  Physical Exam Vitals reviewed.  Constitutional:      General: He is not in acute distress.    Appearance: Normal appearance. He is well-developed. He is not ill-appearing, toxic-appearing or diaphoretic.  HENT:     Head: Normocephalic and atraumatic.     Right Ear: Hearing, tympanic membrane, ear canal and external ear normal. No drainage, swelling or tenderness. No middle ear effusion. There is no impacted cerumen. No mastoid tenderness. Tympanic membrane is not perforated, erythematous, retracted or bulging.     Left Ear: Hearing, tympanic membrane, ear canal and external ear normal. No drainage, swelling or tenderness.  No middle ear effusion. There is no impacted cerumen. No mastoid tenderness. Tympanic membrane is not perforated, erythematous, retracted or bulging.     Nose: No congestion.     Right Sinus: No maxillary sinus tenderness or frontal sinus tenderness.     Left Sinus: No maxillary sinus tenderness or frontal sinus tenderness.     Mouth/Throat:     Mouth: Mucous membranes are moist.     Pharynx: Uvula midline. No oropharyngeal exudate or posterior oropharyngeal erythema.     Tonsils: No tonsillar exudate or tonsillar abscesses.  Cardiovascular:     Rate and Rhythm: Normal rate and regular rhythm.     Heart sounds: Normal heart sounds.  Pulmonary:     Effort: Pulmonary effort is normal. No respiratory distress or retractions.     Breath sounds: Normal air entry. No decreased air movement. Wheezing and rhonchi present. No decreased breath sounds or rales.     Comments: Scattered wheezes and rhonchi throughout. No areas of focal consolidation.   Abdominal:     General: Abdomen is flat. Bowel sounds are normal.     Palpations: Abdomen is soft.     Tenderness: There is no abdominal tenderness. There is no guarding or rebound. Negative signs include Murphy's sign, Rovsing's sign and McBurney's sign.  Lymphadenopathy:     Cervical: No cervical adenopathy.  Neurological:     General: No focal deficit present.     Mental Status: He is alert and oriented to person, place, and time.  Psychiatric:        Attention and Perception: Attention and perception normal.        Mood and Affect: Mood and affect normal.        Behavior: Behavior is cooperative.      UC Treatments / Results  Labs (all labs ordered are listed, but only abnormal results are displayed) Labs Reviewed - No data to display  EKG   Radiology No results found.  Procedures Procedures (including critical care time)  Medications Ordered in UC Medications - No data to display  Initial Impression / Assessment and Plan / UC Course  I have reviewed the triage vital signs and the nursing notes.  Pertinent labs & imaging results that were available during my care of the patient were reviewed by me and considered in my medical decision making (see chart for details).      Pt with long history of COPD/restrictive lung disorder. Followed by [facility], last appt with them was 07/12/2020 (2 months ago). Symptoms are typically well controlled on Albuterol nebulizer and Duoneb inhaler. Was also taking Breztri inhaler but his free sample from pulm ran out. Rec he schedule an appt with them for refills/better control of COPD.   Pt is oxygenating well on room air today. IM Solu-Medrol administered today, and prednisone taper prescribed; he will f/u with his PCP in 3-4  days for recheck. Continue inhaler treatments at home. Will hold off on abx given presentation and lack of focal infiltrate on exam. Pt is not covid vaccinated. ER and return-to-clinic precautions discussed,  patient verbalized understanding.  Pt's most recent labs and PCP notes were reviewed.   Final Clinical Impressions(s) / UC Diagnoses   Final diagnoses:  None   Discharge Instructions   None    ED Prescriptions    None     PDMP not reviewed this encounter.   Hazel Sams, PA-C 10/05/20 1050

## 2020-10-05 NOTE — Discharge Instructions (Addendum)
Start taking the Steroid (Prednisone) tomorrow 10/06/2020. Instructions will be on the prescription. Make an appointment with your pulmonologist for a check up and to get more samples of Breztri inhaler. You can also follow-up with your primary care provider if your symptoms persist despite treatment.   If you develop fevers, chest pain, worsening shortness of breath, etc- head straight to the ER.

## 2020-10-05 NOTE — ED Triage Notes (Signed)
Pt presents with coughing and wheezing X 3 days. Pt states he has not taken OTC meds. Pt states he used his nebulizer but it has not relieved the SOB. Pt denies chest pain.

## 2020-10-25 ENCOUNTER — Other Ambulatory Visit (INDEPENDENT_AMBULATORY_CARE_PROVIDER_SITE_OTHER): Payer: Self-pay | Admitting: Primary Care

## 2020-10-25 DIAGNOSIS — I10 Essential (primary) hypertension: Secondary | ICD-10-CM

## 2021-02-15 ENCOUNTER — Emergency Department (HOSPITAL_COMMUNITY): Payer: Medicare HMO

## 2021-02-15 ENCOUNTER — Observation Stay (HOSPITAL_COMMUNITY)
Admission: EM | Admit: 2021-02-15 | Discharge: 2021-02-17 | Disposition: A | Discharge status: Home / Self Care | Payer: Medicare HMO | Attending: Internal Medicine | Admitting: Internal Medicine

## 2021-02-15 DIAGNOSIS — E119 Type 2 diabetes mellitus without complications: Secondary | ICD-10-CM | POA: Insufficient documentation

## 2021-02-15 DIAGNOSIS — E669 Obesity, unspecified: Secondary | ICD-10-CM

## 2021-02-15 DIAGNOSIS — Z96643 Presence of artificial hip joint, bilateral: Secondary | ICD-10-CM | POA: Diagnosis not present

## 2021-02-15 DIAGNOSIS — N179 Acute kidney failure, unspecified: Secondary | ICD-10-CM | POA: Insufficient documentation

## 2021-02-15 DIAGNOSIS — I10 Essential (primary) hypertension: Secondary | ICD-10-CM | POA: Diagnosis not present

## 2021-02-15 DIAGNOSIS — E1169 Type 2 diabetes mellitus with other specified complication: Secondary | ICD-10-CM

## 2021-02-15 DIAGNOSIS — J449 Chronic obstructive pulmonary disease, unspecified: Secondary | ICD-10-CM | POA: Insufficient documentation

## 2021-02-15 DIAGNOSIS — J45909 Unspecified asthma, uncomplicated: Secondary | ICD-10-CM | POA: Diagnosis not present

## 2021-02-15 DIAGNOSIS — Z79899 Other long term (current) drug therapy: Secondary | ICD-10-CM | POA: Diagnosis not present

## 2021-02-15 DIAGNOSIS — J189 Pneumonia, unspecified organism: Secondary | ICD-10-CM | POA: Insufficient documentation

## 2021-02-15 DIAGNOSIS — Z87891 Personal history of nicotine dependence: Secondary | ICD-10-CM | POA: Insufficient documentation

## 2021-02-15 DIAGNOSIS — Z7984 Long term (current) use of oral hypoglycemic drugs: Secondary | ICD-10-CM | POA: Diagnosis not present

## 2021-02-15 DIAGNOSIS — R55 Syncope and collapse: Principal | ICD-10-CM | POA: Insufficient documentation

## 2021-02-15 DIAGNOSIS — J441 Chronic obstructive pulmonary disease with (acute) exacerbation: Secondary | ICD-10-CM

## 2021-02-15 DIAGNOSIS — Z20822 Contact with and (suspected) exposure to covid-19: Secondary | ICD-10-CM | POA: Insufficient documentation

## 2021-02-15 DIAGNOSIS — R0902 Hypoxemia: Secondary | ICD-10-CM

## 2021-02-15 MED ORDER — SODIUM CHLORIDE 0.9 % IV BOLUS
1000.0000 mL | Freq: Once | INTRAVENOUS | Status: AC
  Administered 2021-02-16: 1000 mL via INTRAVENOUS

## 2021-02-15 NOTE — ED Triage Notes (Signed)
Pt bibems from home c/o Nausea/sweating. Pt pale/clammy when ems arrived. Pt had a near syncopal episode that was witnessed by patients daughter. Pt alert and oriented x4. Pt denies CP/discomfort/SOB

## 2021-02-16 ENCOUNTER — Inpatient Hospital Stay (HOSPITAL_COMMUNITY): Payer: Medicare HMO

## 2021-02-16 ENCOUNTER — Encounter (HOSPITAL_COMMUNITY): Payer: Self-pay | Admitting: Family Medicine

## 2021-02-16 ENCOUNTER — Other Ambulatory Visit: Payer: Self-pay

## 2021-02-16 ENCOUNTER — Emergency Department (HOSPITAL_COMMUNITY): Payer: Medicare HMO

## 2021-02-16 DIAGNOSIS — R55 Syncope and collapse: Secondary | ICD-10-CM

## 2021-02-16 DIAGNOSIS — J189 Pneumonia, unspecified organism: Secondary | ICD-10-CM | POA: Diagnosis not present

## 2021-02-16 DIAGNOSIS — R404 Transient alteration of awareness: Secondary | ICD-10-CM | POA: Diagnosis not present

## 2021-02-16 DIAGNOSIS — N179 Acute kidney failure, unspecified: Secondary | ICD-10-CM

## 2021-02-16 DIAGNOSIS — E669 Obesity, unspecified: Secondary | ICD-10-CM

## 2021-02-16 DIAGNOSIS — E1169 Type 2 diabetes mellitus with other specified complication: Secondary | ICD-10-CM

## 2021-02-16 DIAGNOSIS — J441 Chronic obstructive pulmonary disease with (acute) exacerbation: Secondary | ICD-10-CM

## 2021-02-16 DIAGNOSIS — R0902 Hypoxemia: Secondary | ICD-10-CM

## 2021-02-16 LAB — RESP PANEL BY RT-PCR (FLU A&B, COVID) ARPGX2
Influenza A by PCR: NEGATIVE
Influenza B by PCR: NEGATIVE
SARS Coronavirus 2 by RT PCR: NEGATIVE

## 2021-02-16 LAB — CBG MONITORING, ED
Glucose-Capillary: 110 mg/dL — ABNORMAL HIGH (ref 70–99)
Glucose-Capillary: 102 mg/dL — ABNORMAL HIGH (ref 70–99)
Glucose-Capillary: 109 mg/dL — ABNORMAL HIGH (ref 70–99)

## 2021-02-16 LAB — ECHOCARDIOGRAM COMPLETE
AR max vel: 2.31 cm2
AV Area VTI: 2.06 cm2
AV Area mean vel: 1.87 cm2
AV Mean grad: 6 mmHg
AV Peak grad: 10.1 mmHg
Ao pk vel: 1.59 m/s
Area-P 1/2: 2.73 cm2
Calc EF: 61.7 %
S' Lateral: 2.8 cm
Single Plane A2C EF: 61.3 %
Single Plane A4C EF: 61.9 %

## 2021-02-16 LAB — COMPREHENSIVE METABOLIC PANEL
ALT: 25 U/L (ref 0–44)
AST: 18 U/L (ref 15–41)
Albumin: 3.7 g/dL (ref 3.5–5.0)
Alkaline Phosphatase: 44 U/L (ref 38–126)
Anion gap: 8 (ref 5–15)
BUN: 30 mg/dL — ABNORMAL HIGH (ref 8–23)
CO2: 29 mmol/L (ref 22–32)
Calcium: 9.7 mg/dL (ref 8.9–10.3)
Chloride: 101 mmol/L (ref 98–111)
Creatinine, Ser: 2.1 mg/dL — ABNORMAL HIGH (ref 0.61–1.24)
GFR, Estimated: 33 mL/min — ABNORMAL LOW (ref >60)
Glucose, Bld: 128 mg/dL — ABNORMAL HIGH (ref 70–99)
Potassium: 4.1 mmol/L (ref 3.5–5.1)
Sodium: 138 mmol/L (ref 135–145)
Total Bilirubin: 0.3 mg/dL (ref 0.3–1.2)
Total Protein: 6.4 g/dL — ABNORMAL LOW (ref 6.5–8.1)

## 2021-02-16 LAB — HEMOGLOBIN A1C
Hgb A1c MFr Bld: 6.6 % — ABNORMAL HIGH (ref 4.8–5.6)
Mean Plasma Glucose: 142.72 mg/dL

## 2021-02-16 LAB — CREATININE, SERUM
Creatinine, Ser: 1.53 mg/dL — ABNORMAL HIGH (ref 0.61–1.24)
GFR, Estimated: 49 mL/min — ABNORMAL LOW (ref >60)

## 2021-02-16 LAB — CBC
HCT: 42.9 % (ref 39.0–52.0)
HCT: 44 % (ref 39.0–52.0)
Hemoglobin: 12.7 g/dL — ABNORMAL LOW (ref 13.0–17.0)
Hemoglobin: 13.3 g/dL (ref 13.0–17.0)
MCH: 26.7 pg (ref 26.0–34.0)
MCH: 26.8 pg (ref 26.0–34.0)
MCHC: 29.6 g/dL — ABNORMAL LOW (ref 30.0–36.0)
MCHC: 30.2 g/dL (ref 30.0–36.0)
MCV: 88.7 fL (ref 80.0–100.0)
MCV: 90.1 fL (ref 80.0–100.0)
Platelets: 190 10*3/uL (ref 150–400)
Platelets: 210 10*3/uL (ref 150–400)
RBC: 4.76 MIL/uL (ref 4.22–5.81)
RBC: 4.96 MIL/uL (ref 4.22–5.81)
RDW: 15.2 % (ref 11.5–15.5)
RDW: 15.4 % (ref 11.5–15.5)
WBC: 10.8 10*3/uL — ABNORMAL HIGH (ref 4.0–10.5)
WBC: 8.8 10*3/uL (ref 4.0–10.5)
nRBC: 0 % (ref 0.0–0.2)
nRBC: 0 % (ref 0.0–0.2)

## 2021-02-16 LAB — URINALYSIS, ROUTINE W REFLEX MICROSCOPIC
Bilirubin Urine: NEGATIVE
Glucose, UA: NEGATIVE mg/dL
Hgb urine dipstick: NEGATIVE
Ketones, ur: NEGATIVE mg/dL
Leukocytes,Ua: NEGATIVE
Nitrite: NEGATIVE
Protein, ur: NEGATIVE mg/dL
Specific Gravity, Urine: 1.025 (ref 1.005–1.030)
pH: 5 (ref 5.0–8.0)

## 2021-02-16 LAB — BRAIN NATRIURETIC PEPTIDE: B Natriuretic Peptide: 13.3 pg/mL (ref 0.0–100.0)

## 2021-02-16 LAB — TROPONIN I (HIGH SENSITIVITY)
Troponin I (High Sensitivity): 5 ng/L (ref <18)
Troponin I (High Sensitivity): 5 ng/L (ref <18)

## 2021-02-16 LAB — D-DIMER, QUANTITATIVE: D-Dimer, Quant: 1.34 ug/mL-FEU — ABNORMAL HIGH (ref 0.00–0.50)

## 2021-02-16 MED ORDER — TERAZOSIN HCL 1 MG PO CAPS
2.0000 mg | ORAL_CAPSULE | Freq: Every day | ORAL | Status: DC
  Administered 2021-02-17: 2 mg via ORAL
  Filled 2021-02-16 (×3): qty 2

## 2021-02-16 MED ORDER — SODIUM CHLORIDE 0.9 % IV SOLN
500.0000 mg | Freq: Once | INTRAVENOUS | Status: AC
  Administered 2021-02-16: 500 mg via INTRAVENOUS
  Filled 2021-02-16: qty 500

## 2021-02-16 MED ORDER — LOSARTAN POTASSIUM 50 MG PO TABS: 50.0000 mg | ORAL_TABLET | Freq: Every day | ORAL | Status: DC

## 2021-02-16 MED ORDER — ATENOLOL 50 MG PO TABS
50.0000 mg | ORAL_TABLET | Freq: Every day | ORAL | Status: DC
  Administered 2021-02-16 – 2021-02-17 (×2): 50 mg via ORAL
  Filled 2021-02-16 (×2): qty 1

## 2021-02-16 MED ORDER — UMECLIDINIUM BROMIDE 62.5 MCG/INH IN AEPB
1.0000 | INHALATION_SPRAY | Freq: Every day | RESPIRATORY_TRACT | Status: DC
  Administered 2021-02-17: 1 via RESPIRATORY_TRACT
  Filled 2021-02-16: qty 7

## 2021-02-16 MED ORDER — ATORVASTATIN CALCIUM 40 MG PO TABS
40.0000 mg | ORAL_TABLET | Freq: Every day | ORAL | Status: DC
  Administered 2021-02-16 – 2021-02-17 (×2): 40 mg via ORAL
  Filled 2021-02-16: qty 1
  Filled 2021-02-16: qty 4

## 2021-02-16 MED ORDER — IOHEXOL 350 MG/ML SOLN
75.0000 mL | Freq: Once | INTRAVENOUS | Status: AC | PRN
  Administered 2021-02-16: 75 mL via INTRAVENOUS

## 2021-02-16 MED ORDER — ALBUTEROL SULFATE (2.5 MG/3ML) 0.083% IN NEBU
INHALATION_SOLUTION | RESPIRATORY_TRACT | Status: AC
  Administered 2021-02-16: 2.5 mg
  Filled 2021-02-16: qty 3

## 2021-02-16 MED ORDER — GABAPENTIN 400 MG PO CAPS
800.0000 mg | ORAL_CAPSULE | Freq: Three times a day (TID) | ORAL | Status: DC
  Administered 2021-02-16 – 2021-02-17 (×2): 800 mg via ORAL
  Filled 2021-02-16 (×2): qty 2

## 2021-02-16 MED ORDER — SODIUM CHLORIDE 0.9 % IV SOLN
2.0000 g | INTRAVENOUS | Status: DC
  Administered 2021-02-17: 2 g via INTRAVENOUS
  Filled 2021-02-16: qty 2

## 2021-02-16 MED ORDER — ALBUTEROL SULFATE HFA 108 (90 BASE) MCG/ACT IN AERS
2.0000 | INHALATION_SPRAY | RESPIRATORY_TRACT | Status: DC | PRN
  Filled 2021-02-16: qty 6.7

## 2021-02-16 MED ORDER — SODIUM CHLORIDE 0.9 % IV SOLN: 2.0000 g | INTRAVENOUS | Status: DC

## 2021-02-16 MED ORDER — INSULIN ASPART 100 UNIT/ML ~~LOC~~ SOLN: 0.0000 [IU] | Freq: Three times a day (TID) | SUBCUTANEOUS | Status: DC

## 2021-02-16 MED ORDER — AZITHROMYCIN 250 MG PO TABS: 500.0000 mg | ORAL_TABLET | Freq: Every day | ORAL | Status: DC

## 2021-02-16 MED ORDER — BUDESON-GLYCOPYRROL-FORMOTEROL 160-9-4.8 MCG/ACT IN AERO: 2.0000 | INHALATION_SPRAY | Freq: Two times a day (BID) | RESPIRATORY_TRACT | Status: DC

## 2021-02-16 MED ORDER — AZITHROMYCIN 250 MG PO TABS
500.0000 mg | ORAL_TABLET | Freq: Every day | ORAL | Status: DC
  Administered 2021-02-17: 500 mg via ORAL
  Filled 2021-02-16: qty 2

## 2021-02-16 MED ORDER — LACTATED RINGERS IV SOLN: INTRAVENOUS | Status: DC

## 2021-02-16 MED ORDER — SODIUM CHLORIDE 0.9 % IV BOLUS
1000.0000 mL | Freq: Once | INTRAVENOUS | Status: AC
  Administered 2021-02-16: 1000 mL via INTRAVENOUS

## 2021-02-16 MED ORDER — IPRATROPIUM-ALBUTEROL 0.5-2.5 (3) MG/3ML IN SOLN
3.0000 mL | Freq: Four times a day (QID) | RESPIRATORY_TRACT | Status: DC | PRN
  Administered 2021-02-16: 3 mL via RESPIRATORY_TRACT
  Filled 2021-02-16: qty 3

## 2021-02-16 MED ORDER — SODIUM CHLORIDE 0.9 % IV SOLN
2.0000 g | Freq: Once | INTRAVENOUS | Status: AC
  Administered 2021-02-16: 2 g via INTRAVENOUS
  Filled 2021-02-16: qty 20

## 2021-02-16 MED ORDER — AMLODIPINE BESYLATE 10 MG PO TABS
10.0000 mg | ORAL_TABLET | Freq: Every day | ORAL | Status: DC
  Administered 2021-02-16 – 2021-02-17 (×2): 10 mg via ORAL
  Filled 2021-02-16: qty 1
  Filled 2021-02-16: qty 2

## 2021-02-16 MED ORDER — ENOXAPARIN SODIUM 40 MG/0.4ML ~~LOC~~ SOLN
40.0000 mg | Freq: Every day | SUBCUTANEOUS | Status: DC
  Administered 2021-02-16 – 2021-02-17 (×2): 40 mg via SUBCUTANEOUS
  Filled 2021-02-16 (×2): qty 0.4

## 2021-02-16 MED ORDER — FLUTICASONE FUROATE-VILANTEROL 200-25 MCG/INH IN AEPB
1.0000 | INHALATION_SPRAY | Freq: Every day | RESPIRATORY_TRACT | Status: DC
  Administered 2021-02-17: 1 via RESPIRATORY_TRACT
  Filled 2021-02-16: qty 28

## 2021-02-16 MED ORDER — INSULIN ASPART 100 UNIT/ML ~~LOC~~ SOLN: 0.0000 [IU] | Freq: Every day | SUBCUTANEOUS | Status: DC

## 2021-02-16 NOTE — Progress Notes (Signed)
PROGRESS NOTE        PATIENT DETAILS Name: Evan Brock Age: 70 y.o. Sex: male Date of Birth: 19-Mar-1951 Admit Date: 02/15/2021 Admitting Physician Carlton Adam, MD [id number], Collie Siad., MD  Brief Narrative: Patient is a 70 y.o. male HTN, DM-2, COPD-who presented with a episode of sweating/decreased responsiveness-he was subsequently admitted to the hospitalist service for further evaluation and treatment.  Significant events: 4/26>> ED presentation for evaluation of diaphoresis/lethargy-found to have possible pneumonia/hypoxia/AKI.  Significant studies: 4/27>> CTA chest: No PE-bronchopneumonia.  Antimicrobial therapy: Rocephin: 4/26>> Zithromax: 4/26>>  Microbiology data: 4/27>> influenza/COVID PCR: Negative  Procedures : None  Consults: None  DVT Prophylaxis : enoxaparin (LOVENOX) injection 40 mg Start: 02/16/21 1000   Subjective: Feels better-no major issues overnight.  On 2 L of oxygen.   Assessment/Plan: Diaphoresis/episode of depressed consciousness: Unclear etiology-Per spouse at bedside-patient never lost consciousness.  EMS apparently checked CBGs but patient was not hypoglycemic.  Unclear etiology-continue telemetry monitoring-await EEG/echo.  Nonfocal exam.  Check orthostatic vital signs.  COPD exacerbation: Improved-some scattered rhonchi-continue bronchodilators.  Attempt to titrate off oxygen over the next few days.  Community-acquired pneumonia: Afebrile-no leukocytosis-continue IV antibiotics-reassess on 4/28 for de-escalation.  Acute hypoxic respiratory failure due to COPD exacerbation/CAP: Mild-stable on just 2-3 L of oxygen this morning-attempt to titrate off oxygen over the next few days.  AKI: Likely hemodynamically mediated-improving-avoid nephrotoxic agents.  Repeat electrolytes tomorrow  HTN: Continue atenolol-amlodipine-terazosin-hold losartan for now-given mild AKI.  DM-2 (A1c 6.6 on 4/27): Continue  SSI-resume oral hypoglycemic agents on discharge  Recent Labs    02/16/21 0812  GLUCAP 110*    Diet: Diet Order            Diet heart healthy/carb modified Room service appropriate? Yes; Fluid consistency: Thin  Diet effective now                  Code Status: Full code   Family Communication: Spouse at bedside  Disposition Plan: Status is: Inpatient  Remains inpatient appropriate because:Inpatient level of care appropriate due to severity of illness   Dispo: The patient is from: Home              Anticipated d/c is to: Home              Patient currently is not medically stable to d/c.   Difficult to place patient No    Barriers to Discharge: Presyncope/diaphoresis of unclear etiology-awaiting work-up.  Hypoxia requiring O2 supplementation due to pneumonia/COPD exacerbation-on IV antibiotics.  Antimicrobial agents: Anti-infectives (From admission, onward)   Start     Dose/Rate Route Frequency Ordered Stop   02/17/21 1000  azithromycin (ZITHROMAX) tablet 500 mg        500 mg Oral Daily 02/16/21 0616 02/22/21 0959   02/17/21 0800  cefTRIAXone (ROCEPHIN) 2 g in sodium chloride 0.9 % 100 mL IVPB        2 g 200 mL/hr over 30 Minutes Intravenous Every 24 hours 02/16/21 0616 02/22/21 0759   02/16/21 1000  azithromycin (ZITHROMAX) tablet 500 mg  Status:  Discontinued        500 mg Oral Daily 02/16/21 0603 02/16/21 0616   02/16/21 0615  cefTRIAXone (ROCEPHIN) 2 g in sodium chloride 0.9 % 100 mL IVPB  Status:  Discontinued        2 g 200 mL/hr  over 30 Minutes Intravenous Every 24 hours 02/16/21 0603 02/16/21 0615   02/16/21 0430  cefTRIAXone (ROCEPHIN) 2 g in sodium chloride 0.9 % 100 mL IVPB        2 g 200 mL/hr over 30 Minutes Intravenous  Once 02/16/21 0423 02/16/21 0558   02/16/21 0430  azithromycin (ZITHROMAX) 500 mg in sodium chloride 0.9 % 250 mL IVPB        500 mg 250 mL/hr over 60 Minutes Intravenous  Once 02/16/21 0423 02/16/21 0721       Time spent: 35  minutes-Greater than 50% of this time was spent in counseling, explanation of diagnosis, planning of further management, and coordination of care.  MEDICATIONS: Scheduled Meds: . amLODipine  10 mg Oral Daily  . atenolol  50 mg Oral Daily  . atorvastatin  40 mg Oral Daily  . [START ON 02/17/2021] azithromycin  500 mg Oral Daily  . enoxaparin (LOVENOX) injection  40 mg Subcutaneous Daily  . fluticasone furoate-vilanterol  1 puff Inhalation Daily   And  . umeclidinium bromide  1 puff Inhalation Daily  . insulin aspart  0-15 Units Subcutaneous TID WC  . insulin aspart  0-5 Units Subcutaneous QHS  . terazosin  2 mg Oral Daily   Continuous Infusions: . [START ON 02/17/2021] cefTRIAXone (ROCEPHIN)  IV    . lactated ringers     PRN Meds:.albuterol, ipratropium-albuterol   PHYSICAL EXAM: Vital signs: Vitals:   02/16/21 0245 02/16/21 0345 02/16/21 0715 02/16/21 0918  BP: 138/85 131/79 (!) 136/91 131/87  Pulse: 97 85 85 82  Resp: 20 14 15 20   Temp:      TempSrc:      SpO2: 93% 93% 93% 96%   There were no vitals filed for this visit. There is no height or weight on file to calculate BMI.   Gen Exam:Alert awake-not in any distress HEENT:atraumatic, normocephalic Chest: B/L clear to auscultation anteriorly CVS:S1S2 regular Abdomen:soft non tender, non distended Extremities:no edema Neurology: Non focal Skin: no rash  I have personally reviewed following labs and imaging studies  LABORATORY DATA: CBC: Recent Labs  Lab 02/15/21 2347 02/16/21 0640  WBC 10.8* 8.8  HGB 13.3 12.7*  HCT 44.0 42.9  MCV 88.7 90.1  PLT 210 190    Basic Metabolic Panel: Recent Labs  Lab 02/15/21 2347 02/16/21 0640  NA 138  --   K 4.1  --   CL 101  --   CO2 29  --   GLUCOSE 128*  --   BUN 30*  --   CREATININE 2.10* 1.53*  CALCIUM 9.7  --     GFR: CrCl cannot be calculated (Unknown ideal weight.).  Liver Function Tests: Recent Labs  Lab 02/15/21 2347  AST 18  ALT 25  ALKPHOS 44   BILITOT 0.3  PROT 6.4*  ALBUMIN 3.7   No results for input(s): LIPASE, AMYLASE in the last 168 hours. No results for input(s): AMMONIA in the last 168 hours.  Coagulation Profile: No results for input(s): INR, PROTIME in the last 168 hours.  Cardiac Enzymes: No results for input(s): CKTOTAL, CKMB, CKMBINDEX, TROPONINI in the last 168 hours.  BNP (last 3 results) No results for input(s): PROBNP in the last 8760 hours.  Lipid Profile: No results for input(s): CHOL, HDL, LDLCALC, TRIG, CHOLHDL, LDLDIRECT in the last 72 hours.  Thyroid Function Tests: No results for input(s): TSH, T4TOTAL, FREET4, T3FREE, THYROIDAB in the last 72 hours.  Anemia Panel: No results for input(s): VITAMINB12, FOLATE,  FERRITIN, TIBC, IRON, RETICCTPCT in the last 72 hours.  Urine analysis:    Component Value Date/Time   COLORURINE YELLOW 02/16/2021 0300   APPEARANCEUR CLEAR 02/16/2021 0300   LABSPEC 1.025 02/16/2021 0300   PHURINE 5.0 02/16/2021 0300   GLUCOSEU NEGATIVE 02/16/2021 0300   HGBUR NEGATIVE 02/16/2021 0300   BILIRUBINUR NEGATIVE 02/16/2021 0300   KETONESUR NEGATIVE 02/16/2021 0300   PROTEINUR NEGATIVE 02/16/2021 0300   UROBILINOGEN 1.0 10/01/2013 1851   NITRITE NEGATIVE 02/16/2021 0300   LEUKOCYTESUR NEGATIVE 02/16/2021 0300    Sepsis Labs: Lactic Acid, Venous    Component Value Date/Time   LATICACIDVEN 0.92 10/01/2015 1732    MICROBIOLOGY: Recent Results (from the past 240 hour(s))  Resp Panel by RT-PCR (Flu A&B, Covid) Nasopharyngeal Swab     Status: None   Collection Time: 02/16/21  5:28 AM   Specimen: Nasopharyngeal Swab; Nasopharyngeal(NP) swabs in vial transport medium  Result Value Ref Range Status   SARS Coronavirus 2 by RT PCR NEGATIVE NEGATIVE Final    Comment: (NOTE) SARS-CoV-2 target nucleic acids are NOT DETECTED.  The SARS-CoV-2 RNA is generally detectable in upper respiratory specimens during the acute phase of infection. The lowest concentration of  SARS-CoV-2 viral copies this assay can detect is 138 copies/mL. A negative result does not preclude SARS-Cov-2 infection and should not be used as the sole basis for treatment or other patient management decisions. A negative result may occur with  improper specimen collection/handling, submission of specimen other than nasopharyngeal swab, presence of viral mutation(s) within the areas targeted by this assay, and inadequate number of viral copies(<138 copies/mL). A negative result must be combined with clinical observations, patient history, and epidemiological information. The expected result is Negative.  Fact Sheet for Patients:  BloggerCourse.com  Fact Sheet for Healthcare Providers:  SeriousBroker.it  This test is no t yet approved or cleared by the [place] FDA and  has been authorized for detection and/or diagnosis of SARS-CoV-2 by FDA under an Emergency Use Authorization (EUA). This EUA will remain  in effect (meaning this test can be used) for the duration of the COVID-19 declaration under Section 564(b)(1) of the Act, 21 U.S.C.section 360bbb-3(b)(1), unless the authorization is terminated  or revoked sooner.       Influenza A by PCR NEGATIVE NEGATIVE Final   Influenza B by PCR NEGATIVE NEGATIVE Final    Comment: (NOTE) The Xpert Xpress SARS-CoV-2/FLU/RSV plus assay is intended as an aid in the diagnosis of influenza from Nasopharyngeal swab specimens and should not be used as a sole basis for treatment. Nasal washings and aspirates are unacceptable for Xpert Xpress SARS-CoV-2/FLU/RSV testing.  Fact Sheet for Patients: BloggerCourse.com  Fact Sheet for Healthcare Providers: SeriousBroker.it  This test is not yet approved or cleared by the [place] FDA and has been authorized for detection and/or diagnosis of SARS-CoV-2 by FDA under an Emergency Use  Authorization (EUA). This EUA will remain in effect (meaning this test can be used) for the duration of the COVID-19 declaration under Section 564(b)(1) of the Act, 21 U.S.C. section 360bbb-3(b)(1), unless the authorization is terminated or revoked.  Performed at [facility], [address] [place], [place] [address]     RADIOLOGY STUDIES/RESULTS: CT ANGIO CHEST PE W OR WO CONTRAST  Result Date: 02/16/2021 CLINICAL DATA:  Pulmonary embolus suspected with low to intermediate probability. Positive D-dimer. Nausea and sweating. Near syncopal episode. EXAM: CT ANGIOGRAPHY CHEST WITH CONTRAST TECHNIQUE: Multidetector CT imaging of the chest was performed using  the standard protocol during bolus administration of intravenous contrast. Multiplanar CT image reconstructions and MIPs were obtained to evaluate the vascular anatomy. CONTRAST:  70mL OMNIPAQUE IOHEXOL 350 MG/ML SOLN COMPARISON:  06/02/2013 FINDINGS: Cardiovascular: Good opacification of the central and segmental pulmonary arteries. No focal filling defects. No evidence of significant pulmonary embolus. Normal heart size. No pericardial effusions. Coronary artery and aortic calcifications. No aortic aneurysm. Mediastinum/Nodes: Esophagus is decompressed. No significant lymphadenopathy. Lungs/Pleura: Motion artifact limits examination of the lungs. There appears to be bronchiectasis and mild bronchial wall thickening in the lung bases with some peribronchial infiltration suggesting bronchiolitis or bronchopneumonia. Mild secretions or mucous plugging. No pleural effusions. No pneumothorax. Upper Abdomen: No acute abnormalities demonstrated in the visualized upper abdomen. Musculoskeletal: No chest wall abnormality. No acute or significant osseous findings. Review of the MIP images confirms the above findings. IMPRESSION: 1. No evidence of significant pulmonary embolus. 2. Bronchiectasis and mild bronchial wall thickening in the lung bases with  some peribronchial infiltration suggesting bronchiolitis or bronchopneumonia. Mild secretions or mucous plugging. 3. Aortic atherosclerosis. Aortic Atherosclerosis (ICD10-I70.0). Electronically Signed   By: Burman Nieves M.D.   On: 02/16/2021 02:46   DG Chest Port 1 View  Result Date: 02/16/2021 CLINICAL DATA:  70 year old male with syncope. EXAM: PORTABLE CHEST 1 VIEW COMPARISON:  Chest radiograph dated 02/25/2020. FINDINGS: Mild cardiomegaly with mild central vascular congestion. No focal consolidation, pleural effusion, or pneumothorax. No acute osseous pathology. IMPRESSION: No focal consolidation. Electronically Signed   By: Elgie Collard M.D.   On: 02/16/2021 00:01     LOS: 0 days   Jeoffrey Massed, MD  [facility]    To contact the attending provider between 7A-7P or the covering provider during after hours 7P-7A, please log into the web site [email or web address] and access using universal [place] password for that web site. If you do not have the password, please call the hospital operator.  02/16/2021, 11:09 AM

## 2021-02-16 NOTE — Procedures (Signed)
Patient Name: Wood Novacek  MRN: [id number]  Epilepsy Attending: Charlsie Quest  Referring Physician/Provider: Dr Jeoffrey Massed  Date: 02/16/2021 Duration: 23.23 mins  Patient history: 70 year old male with transient alteration of awareness.  EEG to evaluate for seizures.  Level of alertness: Awake  AEDs during EEG study: None  Technical aspects: This EEG study was done with scalp electrodes positioned according to the 10-20 International system of electrode placement. Electrical activity was acquired at a sampling rate of 500Hz  and reviewed with a high frequency filter of 70Hz  and a low frequency filter of 1Hz . EEG data were recorded continuously and digitally stored.   Description: The posterior dominant rhythm consists of 9-10 Hz activity of moderate voltage (25-35 uV) seen predominantly in posterior head regions, symmetric and reactive to eye opening and eye closing. Physiologic photic driving was not seen during photic stimulation.  Hyperventilation was not performed.     IMPRESSION: This study is within normal limits. No seizures or epileptiform discharges were seen throughout the recording.  Keneisha Heckart 

## 2021-02-16 NOTE — ED Notes (Signed)
Pt off floor, transported up to room. Report called.

## 2021-02-16 NOTE — ED Provider Notes (Signed)
[facility]EMERGENCY [facility]PT [facility] Emergency Department Provider Note MRN:  [id number]  Arrival date & time: 02/16/21     Chief Complaint   Near Syncope and Nausea   History of Present Illness   Evan Brock is a 70 y.o. year-old male with a history of hypertension, COPD presenting to the ED with chief complaint of syncope.  Patient explains he was sitting on the couch watching the news and then he does not know what happened.  He woke up with EMS personnel surrounding him.  Denies any symptoms warning him of a syncopal episode.  Denies any chest pain or shortness of breath, no dizziness prior to passing out.  Denies standing up right before the syncopal episode.  Currently denies any symptoms other than feeling tired.  No chest pain, no shortness of breath, no abdominal pain, no numbness or weakness to the arms or legs.  Normal diet recently, no fever.  Review of Systems  A complete 10 system review of systems was obtained and all systems are negative except as noted in the HPI and PMH.   Patient's Health History    Past Medical History:  Diagnosis Date  . Arthritis    "hips" (10/01/2013)  . Asthma   . COPD (chronic obstructive pulmonary disease) (HCC)   . Exertional shortness of breath   . Hypertension     Past Surgical History:  Procedure Laterality Date  . JOINT REPLACEMENT    . TOTAL HIP ARTHROPLASTY Right 2010  . TOTAL HIP ARTHROPLASTY Left 08/19/2013   Procedure: TOTAL HIP ARTHROPLASTY ANTERIOR APPROACH;  Surgeon: Velna Ochs, MD;  Location: [facility] OR;  Service: Orthopedics;  Laterality: Left;    Family History  Problem Relation Age of Onset  . Diabetes Mother   . Hypertension Father   . Lupus Sister     Social History   Socioeconomic History  . Marital status: Significant Other    Spouse name: Not on file  . Number of children: Not on file  . Years of education: Not on file  . Highest education level: Not on file  Occupational History  . Not on file   Tobacco Use  . Smoking status: Former Smoker    Packs/day: 0.00    Years: 0.00    Pack years: 0.00    Types: Cigarettes    Quit date: 10/23/1990    Years since quitting: 30.3  . Smokeless tobacco: Never Used  . Tobacco comment: 10/01/2013 "quit smoking cigarettes~ 15-20 yr ago"  Substance and Sexual Activity  . Alcohol use: Yes    Alcohol/week: 12.0 standard drinks    Types: 12 Cans of beer per week    Comment: 08/19/2013 "3 times/wk; 3-4 beers/night"  . Drug use: Yes    Types: Marijuana    Comment: 10/01/2013 "last drug use was 10 yr ago"  . Sexual activity: Not Currently  Other Topics Concern  . Not on file  Social History Narrative  . Not on file   Social Determinants of Health   Financial Resource Strain: Not on file  Food Insecurity: Not on file  Transportation Needs: Not on file  Physical Activity: Not on file  Stress: Not on file  Social Connections: Not on file  Intimate Partner Violence: Not on file     Physical Exam   Vitals:   02/16/21 0245 02/16/21 0345  BP: 138/85 131/79  Pulse: 97 85  Resp: 20 14  Temp:    SpO2: 93% 93%    CONSTITUTIONAL: Well-appearing, NAD  NEURO:  Alert and oriented x 3, normal and symmetric strength and sensation, normal coordination, normal speech EYES:  eyes equal and reactive ENT/NECK:  no LAD, no JVD CARDIO: Regular rate, well-perfused, normal S1 and S2 PULM:  CTAB no wheezing or rhonchi GI/GU:  normal bowel sounds, non-distended, non-tender MSK/SPINE:  No gross deformities, no edema SKIN:  no rash, atraumatic PSYCH:  Appropriate speech and behavior  *Additional and/or pertinent findings included in MDM below  Diagnostic and Interventional Summary    EKG Interpretation  Date/Time:  Tuesday February 15 2021 23:06:16 EDT Ventricular Rate:  83 PR Interval:  171 QRS Duration: 87 QT Interval:  371 QTC Calculation: 436 R Axis:   59 Text Interpretation: Sinus rhythm ST elevation suggests acute pericarditis Confirmed by  Kennis Carina [phone]) on 02/15/2021 11:07:25 PM      Labs Reviewed  D-DIMER, QUANTITATIVE - Abnormal; Notable for the following components:      Result Value   D-Dimer, Quant 1.34 (*)    All other components within normal limits  CBC - Abnormal; Notable for the following components:   WBC 10.8 (*)    All other components within normal limits  COMPREHENSIVE METABOLIC PANEL - Abnormal; Notable for the following components:   Glucose, Bld 128 (*)    BUN 30 (*)    Creatinine, Ser 2.10 (*)    Total Protein 6.4 (*)    GFR, Estimated 33 (*)    All other components within normal limits  RESP PANEL BY RT-PCR (FLU A&B, COVID) ARPGX2  URINALYSIS, ROUTINE W REFLEX MICROSCOPIC  BRAIN NATRIURETIC PEPTIDE  TROPONIN I (HIGH SENSITIVITY)  TROPONIN I (HIGH SENSITIVITY)    CT ANGIO CHEST PE W OR WO CONTRAST  Final Result    DG Chest Port 1 View  Final Result      Medications  cefTRIAXone (ROCEPHIN) 2 g in sodium chloride 0.9 % 100 mL IVPB (has no administration in time range)  azithromycin (ZITHROMAX) 500 mg in sodium chloride 0.9 % 250 mL IVPB (has no administration in time range)  sodium chloride 0.9 % bolus 1,000 mL (0 mLs Intravenous Stopped 02/16/21 0140)  sodium chloride 0.9 % bolus 1,000 mL (0 mLs Intravenous Stopped 02/16/21 0229)  iohexol (OMNIPAQUE) 350 MG/ML injection 75 mL (75 mLs Intravenous Contrast Given 02/16/21 0239)     Procedures  /  Critical Care Procedures  ED Course and Medical Decision Making  I have reviewed the triage vital signs, the nursing notes, and pertinent available records from the EMR.  Listed above are laboratory and imaging tests that I personally ordered, reviewed, and interpreted and then considered in my medical decision making (see below for details).  Well-appearing with normal vital signs, normal neurological exam, unclear cause of syncope.  Concerning that patient had no prodrome.  EKG demonstrating some subtle ST changes, does not meet STEMI  criteria and patient having no chest pain.  Will follow closely and check troponin.  PE also considered, checking D-dimer.     D-dimer is positive, CTA is negative for PE.  CT does note possible bronchopneumonia.  During patient's time in the emergency department, he has slowly developed an oxygen requirement.  He desaturates to 87% on room air.  Currently on 2 L.  Given this and his AKI and unprovoked syncope, will admit to medicine.  Elmer Sow. Pilar Plate, MD [facility] Emergency Medicine [facility] [email or web address]  Final Clinical Impressions(s) / ED Diagnoses     ICD-10-CM   1. AKI (acute  kidney injury) (HCC)  N17.9   2. Community acquired pneumonia, unspecified laterality  J18.9   3. Syncope, unspecified syncope type  R55     ED Discharge Orders    None       Discharge Instructions Discussed with and Provided to Patient:   Discharge Instructions   None       Sabas Sous, MD 02/16/21 [phone]

## 2021-02-16 NOTE — Progress Notes (Signed)
Received pt from ED.t GCS 15 and fully alert, pt denies discomforts, dizziness, headache, paresthesia or visual disturbances. Pt family member at bedside

## 2021-02-16 NOTE — H&P (Signed)
History and Physical    Evan Brock IWL:[id number] DOB: Jan 12, 1951 DOA: 02/15/2021  PCP: Sherald Hess., MD   Patient coming from:  Home  Chief Complaint: SOB, decreased responsiveness.   HPI: Evan Brock is a 70 y.o. male with medical history significant for COPD, DMT2, HTN, OA who presents for evalutation of SOB. Reports he was sitting on couch watching TV and wife reports he made loud gurgling noise and was less responsive. She called EMS. The next thing Evan Brock knew is he was surrounded by EMS. Wife states he never lost consciousness but would not verbally respond for a short time and had loud and struggling breathing. He had nonproductive cough. No fever. He was diaphoretic per wife but had no vomiting. He denies any chest pain or palpitations. No abdominal pain. He has not had edema of legs. No recent travel or prolonged immobilization. No hx of blood clots or PE's. He has been taking his mediations as prescribed. He does not use oxygen at home.  No recent travel.  No change in diet.  ED Course: In the emergency room he has negative troponins.  He had an elevated D-dimer level and CT angio of his chest was obtained.  There was no pulmonary embolism but he did have bronchopneumonia in the bases was placed on antibiotic.  He is requiring oxygen by nasal cannula to maintain O2 sat greater than 92% as a new oxygen requirement.  Review of Systems:  General: Reports generalized fatigue. Denies fever, chills, weight loss, night sweats.  Denies dizziness.  Denies change in appetite HENT: Denies head trauma, denies change in hearing, tinnitus.  Denies nasal congestion or bleeding.  Denies sore throat, sores in mouth.  Denies difficulty swallowing Eyes: Denies blurry vision, pain in eye, drainage.  Denies discoloration of eyes. Neck: Denies pain.  Denies swelling.  Denies pain with movement. Cardiovascular: Denies chest pain, palpitations.  Denies edema.  Denies orthopnea Respiratory:  Reports shortness of breath with dry cough.  Denies wheezing.  Denies sputum production Gastrointestinal: Denies abdominal pain, swelling.  Denies nausea, vomiting, diarrhea.  Denies melena.  Denies hematemesis. Musculoskeletal: Denies limitation of movement.  Denies deformity or swelling.  Denies pain.  Denies arthralgias or myalgias. Genitourinary: Denies pelvic pain.  Denies urinary frequency or hesitancy.  Denies dysuria.  Skin: Denies rash.  Denies petechiae, purpura, ecchymosis. Neurological: Denies headache.  Denies syncope.  Denies seizure activity.  Denies paresthesia.  Denies slurred speech, drooping face. Denies visual change. Psychiatric: Denies depression, anxiety.  Denies hallucinations.  Past Medical History:  Diagnosis Date  . Arthritis    "hips" (10/01/2013)  . Asthma   . COPD (chronic obstructive pulmonary disease) ([place])   . Exertional shortness of breath   . Hypertension     Past Surgical History:  Procedure Laterality Date  . JOINT REPLACEMENT    . TOTAL HIP ARTHROPLASTY Right 2010  . TOTAL HIP ARTHROPLASTY Left 08/19/2013   Procedure: TOTAL HIP ARTHROPLASTY ANTERIOR APPROACH;  Surgeon: Hessie Dibble, MD;  Location: [facility];  Service: Orthopedics;  Laterality: Left;    Social History  reports that he quit smoking about 30 years ago. His smoking use included cigarettes. He smoked 0.00 packs per day for 0.00 years. He has never used smokeless tobacco. He reports current alcohol use of about 12.0 standard drinks of alcohol per week. He reports current drug use. Drug: Marijuana.  Not on File  Family History  Problem Relation Age of Onset  . Diabetes Mother   .  Hypertension Father   . Lupus Sister      Prior to Admission medications   Medication Sig Start Date End Date Taking? Authorizing Provider  albuterol (VENTOLIN HFA) 108 (90 Base) MCG/ACT inhaler Inhale 2 puffs into the lungs every 4 (four) hours as needed for wheezing or shortness of breath. 12/27/19    Faustino Congress, NP  albuterol (VENTOLIN HFA) 108 (90 Base) MCG/ACT inhaler INHALE 1 PUFF FOUR TIMES DAILY, AS NEEDED 03/12/20 03/12/21  Fredrich Romans, PA  amLODipine (NORVASC) 10 MG tablet Take 1 tablet (10 mg total) by mouth daily. 11/05/19   Kerin Perna, NP  atenolol (TENORMIN) 50 MG tablet Take 1 tablet (50 mg total) by mouth daily. 11/05/19   Kerin Perna, NP  atorvastatin (LIPITOR) 40 MG tablet Take 1 tablet (40 mg total) by mouth daily. 11/05/19   Kerin Perna, NP  Blood Pressure Monitor KIT 1 kit by Does not apply route 3 (three) times daily as needed. 11/05/19   Kerin Perna, NP  Budeson-Glycopyrrol-Formoterol (BREZTRI AEROSPHERE) 160-9-4.8 MCG/ACT AERO Inhale 2 puffs into the lungs in the morning and at bedtime. 05/19/20   Collene Gobble, MD  Budeson-Glycopyrrol-Formoterol (BREZTRI AEROSPHERE) 160-9-4.8 MCG/ACT AERO Inhale 2 puffs into the lungs in the morning and at bedtime. 07/12/20   Collene Gobble, MD  cloNIDine (CATAPRES) 0.1 MG tablet Take 1 tablet (0.1 mg total) by mouth daily. 11/05/19   Kerin Perna, NP  gabapentin (NEURONTIN) 300 MG capsule TAKE 1 CAPSULE(300 MG) BY MOUTH THREE TIMES DAILY 03/16/20   Kerin Perna, NP  ipratropium-albuterol (DUONEB) 0.5-2.5 (3) MG/3ML SOLN Take 3 mLs by nebulization every 6 (six) hours as needed. 02/05/20   Scot Jun, FNP  losartan (COZAAR) 50 MG tablet Take 1 tablet (50 mg total) by mouth daily. 11/05/19   Kerin Perna, NP  metFORMIN (GLUCOPHAGE) 500 MG tablet Take 1 tablet (500 mg total) by mouth 2 (two) times daily with a meal. 11/05/19   Kerin Perna, NP  predniSONE (STERAPRED UNI-PAK 21 TAB) 10 MG (21) TBPK tablet Take by mouth daily. Take 6 tabs by mouth daily  for 2 days, then 5 tabs for 2 days, then 4 tabs for 2 days, then 3 tabs for 2 days, 2 tabs for 2 days, then 1 tab by mouth daily for 2 days 10/05/20   Hazel Sams, PA-C  SYMBICORT 160-4.5 MCG/ACT inhaler Inhale 2 puffs into  the lungs 2 (two) times daily. Patient not taking: Reported on 07/12/2020 11/05/19   Kerin Perna, NP  tadalafil (CIALIS) 20 MG tablet Take 0.5-1 tablets (10-20 mg total) by mouth every other day as needed for erectile dysfunction. 08/05/19   Kerin Perna, NP  terazosin (HYTRIN) 2 MG capsule Take 1 capsule (2 mg total) by mouth daily. 11/05/19   Kerin Perna, NP  tiZANidine (ZANAFLEX) 4 MG capsule Take 4 mg by mouth 3 (three) times daily. 07/25/19   [provider]    Physical Exam: Vitals:   02/16/21 0100 02/16/21 0130 02/16/21 0245 02/16/21 0345  BP: 131/74 (!) 150/75 138/85 131/79  Pulse: 79 80 97 85  Resp: 16 17 20 14   Temp:      TempSrc:      SpO2: 94% 95% 93% 93%    Constitutional: NAD, calm, comfortable Vitals:   02/16/21 0100 02/16/21 0130 02/16/21 0245 02/16/21 0345  BP: 131/74 (!) 150/75 138/85 131/79  Pulse: 79 80 97 85  Resp: 16 17 20  14  Temp:      TempSrc:      SpO2: 94% 95% 93% 93%   General: WDWN, Alert and oriented x3.  Eyes: EOMI, PERRL, conjunctivae normal.  Sclera nonicteric HENT:  [place]/AT, external ears normal.  Nares patent without epistasis.  Mucous membranes are moist. Posterior pharynx clear of any exudate or lesions.  Neck: Soft, normal range of motion, supple, no masses, no thyromegaly. Trachea midline Respiratory: Equal breath sounds that are mildly diminished in the bases.  Bibasilar rales. No wheezing, no crackles. Normal respiratory effort. No accessory muscle use.  Cardiovascular: Regular rate and rhythm, no murmurs / rubs / gallops. No extremity edema. 2+ pedal pulses. Abdomen: Soft, no tenderness, nondistended, no rebound or guarding. Obese. No masses palpated. Bowel sounds normoactive Musculoskeletal: FROM. no cyanosis. No joint deformity upper and lower extremities. Normal muscle tone.  Skin: Warm, dry, intact no rashes, lesions, ulcers. No induration Neurologic: CN 2-12 grossly intact. Normal speech. Sensation intact,  Strength 5/5 in all extremities.   Psychiatric: Normal judgment and insight.  Normal mood.    Labs on Admission: I have personally reviewed following labs and imaging studies  CBC: Recent Labs  Lab 02/15/21 2347  WBC 10.8*  HGB 13.3  HCT 44.0  MCV 88.7  PLT 379    Basic Metabolic Panel: Recent Labs  Lab 02/15/21 2347  NA 138  K 4.1  CL 101  CO2 29  GLUCOSE 128*  BUN 30*  CREATININE 2.10*  CALCIUM 9.7    GFR: CrCl cannot be calculated (Unknown ideal weight.).  Liver Function Tests: Recent Labs  Lab 02/15/21 2347  AST 18  ALT 25  ALKPHOS 44  BILITOT 0.3  PROT 6.4*  ALBUMIN 3.7    Urine analysis:    Component Value Date/Time   COLORURINE YELLOW 02/16/2021 0300   APPEARANCEUR CLEAR 02/16/2021 0300   LABSPEC 1.025 02/16/2021 0300   PHURINE 5.0 02/16/2021 0300   GLUCOSEU NEGATIVE 02/16/2021 0300   HGBUR NEGATIVE 02/16/2021 0300   BILIRUBINUR NEGATIVE 02/16/2021 0300   KETONESUR NEGATIVE 02/16/2021 0300   PROTEINUR NEGATIVE 02/16/2021 0300   UROBILINOGEN 1.0 10/01/2013 1851   NITRITE NEGATIVE 02/16/2021 0300   LEUKOCYTESUR NEGATIVE 02/16/2021 0300    Radiological Exams on Admission: CT ANGIO CHEST PE W OR WO CONTRAST  Result Date: 02/16/2021 CLINICAL DATA:  Pulmonary embolus suspected with low to intermediate probability. Positive D-dimer. Nausea and sweating. Near syncopal episode. EXAM: CT ANGIOGRAPHY CHEST WITH CONTRAST TECHNIQUE: Multidetector CT imaging of the chest was performed using the standard protocol during bolus administration of intravenous contrast. Multiplanar CT image reconstructions and MIPs were obtained to evaluate the vascular anatomy. CONTRAST:  67m OMNIPAQUE IOHEXOL 350 MG/ML SOLN COMPARISON:  06/02/2013 FINDINGS: Cardiovascular: Good opacification of the central and segmental pulmonary arteries. No focal filling defects. No evidence of significant pulmonary embolus. Normal heart size. No pericardial effusions. Coronary artery and  aortic calcifications. No aortic aneurysm. Mediastinum/Nodes: Esophagus is decompressed. No significant lymphadenopathy. Lungs/Pleura: Motion artifact limits examination of the lungs. There appears to be bronchiectasis and mild bronchial wall thickening in the lung bases with some peribronchial infiltration suggesting bronchiolitis or bronchopneumonia. Mild secretions or mucous plugging. No pleural effusions. No pneumothorax. Upper Abdomen: No acute abnormalities demonstrated in the visualized upper abdomen. Musculoskeletal: No chest wall abnormality. No acute or significant osseous findings. Review of the MIP images confirms the above findings. IMPRESSION: 1. No evidence of significant pulmonary embolus. 2. Bronchiectasis and mild bronchial wall thickening in the lung bases with  some peribronchial infiltration suggesting bronchiolitis or bronchopneumonia. Mild secretions or mucous plugging. 3. Aortic atherosclerosis. Aortic Atherosclerosis (ICD10-I70.0). Electronically Signed   By: Lucienne Capers M.D.   On: 02/16/2021 02:46   DG Chest Port 1 View  Result Date: 02/16/2021 CLINICAL DATA:  70 year old male with syncope. EXAM: PORTABLE CHEST 1 VIEW COMPARISON:  Chest radiograph dated 02/25/2020. FINDINGS: Mild cardiomegaly with mild central vascular congestion. No focal consolidation, pleural effusion, or pneumothorax. No acute osseous pathology. IMPRESSION: No focal consolidation. Electronically Signed   By: Anner Crete M.D.   On: 02/16/2021 00:01    EKG: Independently reviewed.  EKG shows normal sinus rhythm with mildly elevated ST waves anteriorly.  QTc 436  Assessment/Plan Principal Problem:   CAP (community acquired pneumonia) Mr. Blakeman is admitted to medical telemetry unit.  He is started on Rocephin and azithromycin for bronchopneumonia that was seen on CT scan.  Supplement oxygen to keep O2 sat between 92 to 96%.  Currently requiring oxygen by nasal cannula which is a new  requirement Incentive spirometer every 2 hours while awake.  Active Problems:   Hypoxia Supplemental oxygen. Incentive spirometer    Essential hypertension, benign Continue home antihypertensive medications. Monitor BP.     COPD with acute exacerbation  Continue home medication of Breztri inhaler, duonebs Q4 hr as needed. Albuterol MDI with spacer as needed.  Pt placed on antibiotics as above.     Diabetes mellitus type 2 in obese  Metformin will be held for 48 hours after receiving contrast for CT scan. When she will be monitored with meals and at bedtime.  Sliding scale insulin as needed for glycemic control. Check hemoglobin A1c    AKI (acute kidney injury)  Mild AKI. IV fluid hydration with LR.  Recheck electrolytes and renal function in am    DVT prophylaxis: Lovenox for DVT prophylaxis Code Status:   Full code Family Communication:      diagnosis and plan as discussed with patient and his wife who is at the bedside.  They verbalized understanding and agree with plan.  Questions answered.  Further recommendations to follow as clinically indicated Disposition Plan:   Patient is from:  Home  Anticipated DC to:  Home  Anticipated DC date:  Anticipate 2 midnight stay in the hospital to treat acute condition  Anticipated DC barriers: No barriers to discharge identified at this time  Admission status:  Inpatient  Yevonne Aline Roshni Burbano MD [facility]  How to contact the [facility] Attending or Consulting provider [place] or covering provider during after hours [place], for this patient?   1. Check the care team in [facility] and look for a) attending/consulting [facility] provider listed and b) the [facility] team listed 2. Log into [email or web address] and use [place] universal password to access. If you do not have the password, please contact the hospital operator. 3. Locate the [facility] provider you are looking for under [facility] and page to a number that you can be directly reached. 4. If  you still have difficulty reaching the provider, please page the [facility] (Director on Call) for the Hospitalists listed on amion for assistance.  02/16/2021, 5:36 AM

## 2021-02-16 NOTE — Progress Notes (Incomplete)
  Echocardiogram 2D Echocardiogram has been performed.  Evan Brock  [name] 02/16/2021, 1:59 PM

## 2021-02-16 NOTE — Progress Notes (Signed)
[facility] night shift.  The patient requested to have his evening gabapentin ordered.  Gabapentin 800 mg p.o. 3 times daily ordered.  Sanda Klein, MD.

## 2021-02-16 NOTE — ED Notes (Signed)
Tried calling report, RN will call me back 

## 2021-02-16 NOTE — Progress Notes (Signed)
EEG complete - results pending. EEG complete - results pending.  

## 2021-02-17 DIAGNOSIS — J441 Chronic obstructive pulmonary disease with (acute) exacerbation: Secondary | ICD-10-CM

## 2021-02-17 DIAGNOSIS — J189 Pneumonia, unspecified organism: Secondary | ICD-10-CM | POA: Diagnosis not present

## 2021-02-17 DIAGNOSIS — E1169 Type 2 diabetes mellitus with other specified complication: Secondary | ICD-10-CM | POA: Diagnosis not present

## 2021-02-17 DIAGNOSIS — N179 Acute kidney failure, unspecified: Secondary | ICD-10-CM | POA: Diagnosis not present

## 2021-02-17 LAB — CBC
HCT: 45.2 % (ref 39.0–52.0)
Hemoglobin: 13.7 g/dL (ref 13.0–17.0)
MCH: 26.7 pg (ref 26.0–34.0)
MCHC: 30.3 g/dL (ref 30.0–36.0)
MCV: 87.9 fL (ref 80.0–100.0)
Platelets: 208 10*3/uL (ref 150–400)
RBC: 5.14 MIL/uL (ref 4.22–5.81)
RDW: 14.8 % (ref 11.5–15.5)
WBC: 9 10*3/uL (ref 4.0–10.5)
nRBC: 0 % (ref 0.0–0.2)

## 2021-02-17 LAB — BASIC METABOLIC PANEL
Anion gap: 6 (ref 5–15)
BUN: 12 mg/dL (ref 8–23)
CO2: 29 mmol/L (ref 22–32)
Calcium: 9.3 mg/dL (ref 8.9–10.3)
Chloride: 101 mmol/L (ref 98–111)
Creatinine, Ser: 1.06 mg/dL (ref 0.61–1.24)
GFR, Estimated: >60 mL/min (ref >60)
Glucose, Bld: 97 mg/dL (ref 70–99)
Potassium: 4.1 mmol/L (ref 3.5–5.1)
Sodium: 136 mmol/L (ref 135–145)

## 2021-02-17 LAB — GLUCOSE, CAPILLARY: Glucose-Capillary: 101 mg/dL — ABNORMAL HIGH (ref 70–99)

## 2021-02-17 MED ORDER — TRAZODONE HCL 50 MG PO TABS
50.0000 mg | ORAL_TABLET | Freq: Every evening | ORAL | Status: DC | PRN
  Filled 2021-02-17: qty 1

## 2021-02-17 MED ORDER — AMOXICILLIN-POT CLAVULANATE 875-125 MG PO TABS: 1.0000 | ORAL_TABLET | Freq: Two times a day (BID) | ORAL | 0 refills | Status: AC

## 2021-02-17 NOTE — Discharge Summary (Signed)
PATIENT DETAILS Name: Kentravious Lipford Age: 70 y.o. Sex: male Date of Birth: Oct 09, 1951 MRN: [id number]. Admitting Physician: Eben Burow, MD [id number], Mike Gip., MD  Admit Date: 02/15/2021 Discharge date: 02/17/2021  Recommendations for Outpatient Follow-up:  1. Follow up with PCP in 1-2 weeks 2. Please obtain CMP/CBC in one week   Admitted From:  Home  Disposition: [place]: No  Equipment/Devices: None  Discharge Condition: Stable  CODE STATUS: FULL CODE  Diet recommendation:  Diet Order            Diet - low sodium heart healthy           Diet Carb Modified           Diet heart healthy/carb modified Room service appropriate? Yes; Fluid consistency: Thin  Diet effective now                 Brief Narrative: Patient is a 70 y.o. male HTN, DM-2, COPD-who presented with a episode of sweating/decreased responsiveness-he was subsequently admitted to the hospitalist service for further evaluation and treatment.  Significant events: 4/26>> ED presentation for evaluation of diaphoresis/lethargy-found to have possible pneumonia/hypoxia/AKI.  Significant studies: 4/27>> CTA chest: No PE-bronchopneumonia.  Antimicrobial therapy: Rocephin: 4/26>> Zithromax: 4/26>>  Microbiology data: 4/27>> influenza/COVID PCR: Negative  Procedures : None  Consults: None  Brief Hospital Course: Diaphoresis/episode of depressed consciousness-?presyncope: Unclear etiology-Per spouse at bedside on 4/27-patient never lost consciousness.  EMS apparently checked CBGs but patient was not hypoglycemic.    Telemetry negative for arrhythmias-EEG negative for seizures-echo with preserved EF.  CT head was unremarkable.  Orthostatic vital signs were negative.  Patient feels much better-has not had any recurrence of his presenting complaints during this hospital stay.  I wonder if he had a vasovagal event due to coughing from his underlying bronchopneumonia.   Continue monitoring in the outpatient setting by PCP-if this reoccurs-May benefit from outpatient event monitor.  COPD exacerbation:  Significantly better-no wheezing this morning-some cough-lungs are clear-resume usual bronchodilator regimen.  Community-acquired pneumonia: Afebrile-no leukocytosis-some cough-but clearly better-he is anxious to go home today.  We will switch to Augmentin on discharge.  Acute hypoxic respiratory failure due to COPD exacerbation/CAP: Resolved-on room air this morning.  AKI: Likely hemodynamically mediated-.  Followed with supportive care.  HTN: Continue atenolol-amlodipine-terazosin-now the renal failure has resolved-okay to resume losartan.  DM-2 (A1c 6.6 on 4/27): Continue SSI-resume oral hypoglycemic agents on discharge  Obesity: Estimated body mass index is 33.15 kg/m as calculated from the following:   Height as of 07/12/20: _0  (1.727 m).   Weight as of this encounter: 98.9 kg.    Note-left voicemail for patient's spouse on the day of discharge.   Discharge Diagnoses:  Principal Problem:   CAP (community acquired pneumonia) Active Problems:   Essential hypertension, benign   Hypoxia   COPD with acute exacerbation ([place])   Diabetes mellitus type 2 in obese ([place])   AKI (acute kidney injury) [facility])   Discharge Instructions:  Activity:  As tolerated   Discharge Instructions    Call MD for:  difficulty breathing, headache or visual disturbances   Complete by: As directed    Call MD for:  persistant dizziness or light-headedness   Complete by: As directed    Call MD for:  persistant nausea and vomiting   Complete by: As directed    Diet - low sodium heart healthy   Complete by: As directed    Diet Carb Modified  Complete by: As directed    Discharge instructions   Complete by: As directed    Follow with Primary MD  Sherald Hess., MD in 1-2 weeks  Please get a complete blood count and chemistry panel checked by your  Primary MD at your next visit, and again as instructed by your Primary MD.  Get Medicines reviewed and adjusted: Please take all your medications with you for your next visit with your Primary MD  Laboratory/radiological data: Please request your Primary MD to go over all hospital tests and procedure/radiological results at the follow up, please ask your Primary MD to get all Hospital records sent to his/her office.  In some cases, they will be blood work, cultures and biopsy results pending at the time of your discharge. Please request that your primary care M.D. follows up on these results.  Also Note the following: If you experience worsening of your admission symptoms, develop shortness of breath, life threatening emergency, suicidal or homicidal thoughts you must seek medical attention immediately by calling 911 or calling your MD immediately  if symptoms less severe.  You must read complete instructions/literature along with all the possible adverse reactions/side effects for all the Medicines you take and that have been prescribed to you. Take any new Medicines after you have completely understood and accpet all the possible adverse reactions/side effects.   Do not drive when taking Pain medications or sleeping medications (Benzodaizepines)  Do not take more than prescribed Pain, Sleep and Anxiety Medications. It is not advisable to combine anxiety,sleep and pain medications without talking with your primary care practitioner  Special Instructions: If you have smoked or chewed Tobacco  in the last 2 yrs please stop smoking, stop any regular Alcohol  and or any Recreational drug use.  Wear Seat belts while driving.  Please note: You were cared for by a hospitalist during your hospital stay. Once you are discharged, your primary care physician will handle any further medical issues. Please note that NO REFILLS for any discharge medications will be authorized once you are discharged, as  it is imperative that you return to your primary care physician (or establish a relationship with a primary care physician if you do not have one) for your post hospital discharge needs so that they can reassess your need for medications and monitor your lab values.   Increase activity slowly   Complete by: As directed      Allergies as of 02/17/2021   No Known Allergies     Medication List    STOP taking these medications   predniSONE 10 MG (21) Tbpk tablet Commonly known as: STERAPRED UNI-PAK 21 TAB   Symbicort 160-4.5 MCG/ACT inhaler Generic drug: budesonide-formoterol   tadalafil 20 MG tablet Commonly known as: CIALIS     TAKE these medications   amLODipine 10 MG tablet Commonly known as: NORVASC Take 1 tablet (10 mg total) by mouth daily.   amoxicillin-clavulanate 875-125 MG tablet Commonly known as: Augmentin Take 1 tablet by mouth 2 (two) times daily for 3 days.   atenolol 50 MG tablet Commonly known as: TENORMIN Take 1 tablet (50 mg total) by mouth daily.   atorvastatin 40 MG tablet Commonly known as: LIPITOR Take 1 tablet (40 mg total) by mouth daily.   Blood Pressure Monitor Kit 1 kit by Does not apply route 3 (three) times daily as needed.   Breztri Aerosphere 160-9-4.8 MCG/ACT Aero Generic drug: Budeson-Glycopyrrol-Formoterol Inhale 2 puffs into the lungs in the morning  and at bedtime.   chlorthalidone 25 MG tablet Commonly known as: HYGROTON Take 25 mg by mouth daily.   cloNIDine 0.1 MG tablet Commonly known as: CATAPRES Take 1 tablet (0.1 mg total) by mouth daily.   gabapentin 800 MG tablet Commonly known as: NEURONTIN Take 800 mg by mouth 3 (three) times daily. What changed: Another medication with the same name was removed. Continue taking this medication, and follow the directions you see here.   HYDROcodone-acetaminophen 10-325 MG tablet Commonly known as: NORCO Take 1 tablet by mouth every 6 (six) hours as needed for moderate pain or  severe pain.   losartan 50 MG tablet Commonly known as: COZAAR Take 1 tablet (50 mg total) by mouth daily.   metFORMIN 500 MG tablet Commonly known as: GLUCOPHAGE Take 1 tablet (500 mg total) by mouth 2 (two) times daily with a meal.   ProAir HFA 108 (90 Base) MCG/ACT inhaler Generic drug: albuterol INHALE 1 PUFF FOUR TIMES DAILY, AS NEEDED What changed: Another medication with the same name was removed. Continue taking this medication, and follow the directions you see here.   albuterol (2.5 MG/3ML) 0.083% nebulizer solution Commonly known as: PROVENTIL Take 2.5 mg by nebulization every 6 (six) hours as needed for wheezing or shortness of breath. What changed: Another medication with the same name was removed. Continue taking this medication, and follow the directions you see here.   tiZANidine 4 MG tablet Commonly known as: ZANAFLEX Take 4 mg by mouth 3 (three) times daily as needed for muscle spasms.       Follow-up Information    Sherald Hess., MD. Schedule an appointment as soon as possible for a visit in 1 week(s).   Specialty: Family Medicine Contact information: [place] [address] [phone]              No Known Allergies     Other Procedures/Studies: CT HEAD WO CONTRAST  Result Date: 02/16/2021 CLINICAL DATA:  Altered mental status.  Near syncope today. EXAM: CT HEAD WITHOUT CONTRAST TECHNIQUE: Contiguous axial images were obtained from the base of the skull through the vertex without intravenous contrast. Patient received IV contrast earlier in the day. COMPARISON:  None. FINDINGS: Brain: No intracranial hemorrhage, mass effect, or midline shift. No hydrocephalus. The basilar cisterns are patent. Minimal periventricular chronic small vessel ischemia, normal for age. No evidence of territorial infarct or acute ischemia. No extra-axial or intracranial fluid collection. Vascular: No hyperdense vessel. Skull: No fracture or focal  lesion. Sinuses/Orbits: Left maxillary sinus mucosal thickening with fluid level. Tiny mucous retention cyst in right side of sphenoid sinus. Orbits are unremarkable. No mastoid effusion. Other: None. IMPRESSION: 1. No acute intracranial abnormality. 2. Left maxillary sinus mucosal thickening with fluid level, consider acute sinusitis. Electronically Signed   By: Keith Rake M.D.   On: 02/16/2021 17:07   CT ANGIO CHEST PE W OR WO CONTRAST  Result Date: 02/16/2021 CLINICAL DATA:  Pulmonary embolus suspected with low to intermediate probability. Positive D-dimer. Nausea and sweating. Near syncopal episode. EXAM: CT ANGIOGRAPHY CHEST WITH CONTRAST TECHNIQUE: Multidetector CT imaging of the chest was performed using the standard protocol during bolus administration of intravenous contrast. Multiplanar CT image reconstructions and MIPs were obtained to evaluate the vascular anatomy. CONTRAST:  2m OMNIPAQUE IOHEXOL 350 MG/ML SOLN COMPARISON:  06/02/2013 FINDINGS: Cardiovascular: Good opacification of the central and segmental pulmonary arteries. No focal filling defects. No evidence of significant pulmonary embolus. Normal heart size. No  pericardial effusions. Coronary artery and aortic calcifications. No aortic aneurysm. Mediastinum/Nodes: Esophagus is decompressed. No significant lymphadenopathy. Lungs/Pleura: Motion artifact limits examination of the lungs. There appears to be bronchiectasis and mild bronchial wall thickening in the lung bases with some peribronchial infiltration suggesting bronchiolitis or bronchopneumonia. Mild secretions or mucous plugging. No pleural effusions. No pneumothorax. Upper Abdomen: No acute abnormalities demonstrated in the visualized upper abdomen. Musculoskeletal: No chest wall abnormality. No acute or significant osseous findings. Review of the MIP images confirms the above findings. IMPRESSION: 1. No evidence of significant pulmonary embolus. 2. Bronchiectasis and mild  bronchial wall thickening in the lung bases with some peribronchial infiltration suggesting bronchiolitis or bronchopneumonia. Mild secretions or mucous plugging. 3. Aortic atherosclerosis. Aortic Atherosclerosis (ICD10-I70.0). Electronically Signed   By: Lucienne Capers M.D.   On: 02/16/2021 02:46   DG Chest Port 1 View  Result Date: 02/16/2021 CLINICAL DATA:  70 year old male with syncope. EXAM: PORTABLE CHEST 1 VIEW COMPARISON:  Chest radiograph dated 02/25/2020. FINDINGS: Mild cardiomegaly with mild central vascular congestion. No focal consolidation, pleural effusion, or pneumothorax. No acute osseous pathology. IMPRESSION: No focal consolidation. Electronically Signed   By: Anner Crete M.D.   On: 02/16/2021 00:01   EEG adult  Result Date: 02/16/2021 Lora Havens, MD     02/16/2021 11:55 AM Patient Name: Ervan Heber MRN: [id number] Epilepsy Attending: Lora Havens Referring Physician/Provider: Dr Oren Binet Date: 02/16/2021 Duration: 23.23 mins Patient history: 70 year old male with transient alteration of awareness.  EEG to evaluate for seizures. Level of alertness: Awake AEDs during EEG study: None Technical aspects: This EEG study was done with scalp electrodes positioned according to the 10-20 International system of electrode placement. Electrical activity was acquired at a sampling rate of _0  and reviewed with a high frequency filter of _1  and a low frequency filter of _2 . EEG data were recorded continuously and digitally stored. Description: The posterior dominant rhythm consists of 9-10 Hz activity of moderate voltage (25-35 uV) seen predominantly in posterior head regions, symmetric and reactive to eye opening and eye closing. Physiologic photic driving was not seen during photic stimulation.  Hyperventilation was not performed.   IMPRESSION: This study is within normal limits. No seizures or epileptiform discharges were seen throughout the recording. Lora Havens    ECHOCARDIOGRAM COMPLETE  Result Date: 02/16/2021    ECHOCARDIOGRAM REPORT   Patient Name:   OTHER ATIENZA Date of Exam: 02/16/2021 Medical Rec #:  [id number]    Height:       68.0 in Accession #:    [id number]   Weight:       205.0 lb Date of Birth:  [birth year]    BSA:          2.065 m Patient Age:    86 years     BP:           131/87 mmHg Patient Gender: M            HR:           82 bpm. Exam Location:  Inpatient Procedure: 2D Echo, Cardiac Doppler and Color Doppler Indications:    Syncope  History:        Patient has no prior history of Echocardiogram examinations.                 COPD; Risk Factors:Hypertension and Diabetes.  Sonographer:    Cammy Brochure Referring Phys: [place]  1. Left ventricular ejection fraction, by estimation,  is 60 to 65%. The left ventricle has normal function. The left ventricle has no regional wall motion abnormalities. There is mild left ventricular hypertrophy. Left ventricular diastolic parameters were normal.  2. Right ventricular systolic function is normal. The right ventricular size is normal. Tricuspid regurgitation signal is inadequate for assessing PA pressure.  3. Right atrial size was mildly dilated.  4. The mitral valve is normal in structure. No evidence of mitral valve regurgitation. No evidence of mitral stenosis.  5. The aortic valve was not well visualized. Aortic valve regurgitation is not visualized. No aortic stenosis is present.  6. The inferior vena cava is normal in size with greater than 50% respiratory variability, suggesting right atrial pressure of 3 mmHg. FINDINGS  Left Ventricle: Left ventricular ejection fraction, by estimation, is 60 to 65%. The left ventricle has normal function. The left ventricle has no regional wall motion abnormalities. The left ventricular internal cavity size was normal in size. There is  mild left ventricular hypertrophy. Left ventricular diastolic parameters were normal. Right Ventricle: The  right ventricular size is normal. No increase in right ventricular wall thickness. Right ventricular systolic function is normal. Tricuspid regurgitation signal is inadequate for assessing PA pressure. Left Atrium: Left atrial size was normal in size. Right Atrium: Right atrial size was mildly dilated. Pericardium: There is no evidence of pericardial effusion. Mitral Valve: The mitral valve is normal in structure. No evidence of mitral valve regurgitation. No evidence of mitral valve stenosis. Tricuspid Valve: The tricuspid valve is normal in structure. Tricuspid valve regurgitation is not demonstrated. Aortic Valve: The aortic valve was not well visualized. Aortic valve regurgitation is not visualized. No aortic stenosis is present. Aortic valve mean gradient measures 6.0 mmHg. Aortic valve peak gradient measures 10.1 mmHg. Aortic valve area, by VTI measures 2.06 cm. Pulmonic Valve: The pulmonic valve was not well visualized. Pulmonic valve regurgitation is not visualized. Aorta: The aortic root is normal in size and structure. Venous: The inferior vena cava is normal in size with greater than 50% respiratory variability, suggesting right atrial pressure of 3 mmHg. IAS/Shunts: The interatrial septum was not well visualized.  LEFT VENTRICLE PLAX 2D LVIDd:         4.10 cm     Diastology LVIDs:         2.80 cm     LV e' medial:    8.92 cm/s LV PW:         1.20 cm     LV E/e' medial:  6.8 LV IVS:        1.30 cm     LV e' lateral:   10.40 cm/s LVOT diam:     2.10 cm     LV E/e' lateral: 5.8 LV SV:         63 LV SV Index:   31 LVOT Area:     3.46 cm  LV Volumes (MOD) LV vol d, MOD A2C: 97.1 ml LV vol d, MOD A4C: 98.3 ml LV vol s, MOD A2C: 37.6 ml LV vol s, MOD A4C: 37.5 ml LV SV MOD A2C:     59.5 ml LV SV MOD A4C:     98.3 ml LV SV MOD BP:      60.8 ml RIGHT VENTRICLE             IVC RV S prime:     12.90 cm/s  IVC diam: 1.50 cm TAPSE (M-mode): 2.4 cm LEFT ATRIUM  Index       RIGHT ATRIUM           Index LA  diam:        2.40 cm 1.16 cm/m  RA Area:     23.50 cm LA Vol (A2C):   57.7 ml 27.94 ml/m RA Volume:   80.40 ml  38.93 ml/m LA Vol (A4C):   51.3 ml 24.84 ml/m LA Biplane Vol: 55.0 ml 26.63 ml/m  AORTIC VALVE AV Area (Vmax):    2.31 cm AV Area (Vmean):   1.87 cm AV Area (VTI):     2.06 cm AV Vmax:           159.00 cm/s AV Vmean:          115.000 cm/s AV VTI:            0.308 m AV Peak Grad:      10.1 mmHg AV Mean Grad:      6.0 mmHg LVOT Vmax:         106.00 cm/s LVOT Vmean:        62.000 cm/s LVOT VTI:          0.183 m LVOT/AV VTI ratio: 0.59  AORTA Ao Root diam: 3.70 cm MITRAL VALVE MV Area (PHT): 2.73 cm    SHUNTS MV Decel Time: 278 msec    Systemic VTI:  0.18 m MV E velocity: 60.60 cm/s  Systemic Diam: 2.10 cm MV A velocity: 73.70 cm/s MV E/A ratio:  0.82 Oswaldo Milian MD Electronically signed by Oswaldo Milian MD Signature Date/Time: 02/16/2021/6:10:09 PM    Final      TODAY-DAY OF DISCHARGE:  Subjective:   Elayne Snare today has no headache,no chest abdominal pain,no new weakness tingling or numbness, feels much better wants to go home today.   Objective:   Blood pressure (!) 179/99, pulse 75, temperature 97.7 F (36.5 C), temperature source Oral, resp. rate 18, weight 98.9 kg, SpO2 97 %.  Intake/Output Summary (Last 24 hours) at 02/17/2021 0950 Last data filed at 02/17/2021 0543 Gross per 24 hour  Intake --  Output 1000 ml  Net -1000 ml   Filed Weights   02/16/21 1827  Weight: 98.9 kg    Exam: Awake Alert, Oriented *3, No new F.N deficits, Normal affect [place].AT,PERRAL Supple Neck,No JVD, No cervical lymphadenopathy appriciated.  Symmetrical Chest wall movement, Good air movement bilaterally, CTAB RRR,No Gallops,Rubs or new Murmurs, No Parasternal Heave +ve B.Sounds, Abd Soft, Non tender, No organomegaly appriciated, No rebound -guarding or rigidity. No Cyanosis, Clubbing or edema, No new Rash or bruise   PERTINENT RADIOLOGIC STUDIES: CT HEAD WO  CONTRAST  Result Date: 02/16/2021 CLINICAL DATA:  Altered mental status.  Near syncope today. EXAM: CT HEAD WITHOUT CONTRAST TECHNIQUE: Contiguous axial images were obtained from the base of the skull through the vertex without intravenous contrast. Patient received IV contrast earlier in the day. COMPARISON:  None. FINDINGS: Brain: No intracranial hemorrhage, mass effect, or midline shift. No hydrocephalus. The basilar cisterns are patent. Minimal periventricular chronic small vessel ischemia, normal for age. No evidence of territorial infarct or acute ischemia. No extra-axial or intracranial fluid collection. Vascular: No hyperdense vessel. Skull: No fracture or focal lesion. Sinuses/Orbits: Left maxillary sinus mucosal thickening with fluid level. Tiny mucous retention cyst in right side of sphenoid sinus. Orbits are unremarkable. No mastoid effusion. Other: None. IMPRESSION: 1. No acute intracranial abnormality. 2. Left maxillary sinus mucosal thickening with fluid level, consider acute sinusitis. Electronically Signed   By:  Keith Rake M.D.   On: 02/16/2021 17:07   CT ANGIO CHEST PE W OR WO CONTRAST  Result Date: 02/16/2021 CLINICAL DATA:  Pulmonary embolus suspected with low to intermediate probability. Positive D-dimer. Nausea and sweating. Near syncopal episode. EXAM: CT ANGIOGRAPHY CHEST WITH CONTRAST TECHNIQUE: Multidetector CT imaging of the chest was performed using the standard protocol during bolus administration of intravenous contrast. Multiplanar CT image reconstructions and MIPs were obtained to evaluate the vascular anatomy. CONTRAST:  40m OMNIPAQUE IOHEXOL 350 MG/ML SOLN COMPARISON:  06/02/2013 FINDINGS: Cardiovascular: Good opacification of the central and segmental pulmonary arteries. No focal filling defects. No evidence of significant pulmonary embolus. Normal heart size. No pericardial effusions. Coronary artery and aortic calcifications. No aortic aneurysm. Mediastinum/Nodes:  Esophagus is decompressed. No significant lymphadenopathy. Lungs/Pleura: Motion artifact limits examination of the lungs. There appears to be bronchiectasis and mild bronchial wall thickening in the lung bases with some peribronchial infiltration suggesting bronchiolitis or bronchopneumonia. Mild secretions or mucous plugging. No pleural effusions. No pneumothorax. Upper Abdomen: No acute abnormalities demonstrated in the visualized upper abdomen. Musculoskeletal: No chest wall abnormality. No acute or significant osseous findings. Review of the MIP images confirms the above findings. IMPRESSION: 1. No evidence of significant pulmonary embolus. 2. Bronchiectasis and mild bronchial wall thickening in the lung bases with some peribronchial infiltration suggesting bronchiolitis or bronchopneumonia. Mild secretions or mucous plugging. 3. Aortic atherosclerosis. Aortic Atherosclerosis (ICD10-I70.0). Electronically Signed   By: WLucienne CapersM.D.   On: 02/16/2021 02:46   DG Chest Port 1 View  Result Date: 02/16/2021 CLINICAL DATA:  70year old male with syncope. EXAM: PORTABLE CHEST 1 VIEW COMPARISON:  Chest radiograph dated 02/25/2020. FINDINGS: Mild cardiomegaly with mild central vascular congestion. No focal consolidation, pleural effusion, or pneumothorax. No acute osseous pathology. IMPRESSION: No focal consolidation. Electronically Signed   By: AAnner CreteM.D.   On: 02/16/2021 00:01   EEG adult  Result Date: 02/16/2021 YLora Havens MD     02/16/2021 11:55 AM Patient Name: LDevyn SheerinMRN: [id number] Attending: PLora HavensReferring Physician/Provider: Dr SOren BinetDate: 02/16/2021 Duration: 23.23 mins Patient history: 70year old male with transient alteration of awareness.  EEG to evaluate for seizures. Level of alertness: Awake AEDs during EEG study: None Technical aspects: This EEG study was done with scalp electrodes positioned according to the 10-20 International system  of electrode placement. Electrical activity was acquired at a sampling rate of _0  and reviewed with a high frequency filter of _1  and a low frequency filter of _2 . EEG data were recorded continuously and digitally stored. Description: The posterior dominant rhythm consists of 9-10 Hz activity of moderate voltage (25-35 uV) seen predominantly in posterior head regions, symmetric and reactive to eye opening and eye closing. Physiologic photic driving was not seen during photic stimulation.  Hyperventilation was not performed.   IMPRESSION: This study is within normal limits. No seizures or epileptiform discharges were seen throughout the recording. PLora Havens  ECHOCARDIOGRAM COMPLETE  Result Date: 02/16/2021    ECHOCARDIOGRAM REPORT   Patient Name:   LHUXLEY SHURLEYDate of Exam: 02/16/2021 Medical Rec #:  [id number]   Height:       68.0 in Accession #:    [id number]  Weight:       205.0 lb Date of Birth:  [birth year]   BSA:          2.065 m Patient Age:    89years     BP:  131/87 mmHg Patient Gender: M            HR:           82 bpm. Exam Location:  Inpatient Procedure: 2D Echo, Cardiac Doppler and Color Doppler Indications:    Syncope  History:        Patient has no prior history of Echocardiogram examinations.                 COPD; Risk Factors:Hypertension and Diabetes.  Sonographer:    Cammy Brochure Referring Phys: [place]  1. Left ventricular ejection fraction, by estimation, is 60 to 65%. The left ventricle has normal function. The left ventricle has no regional wall motion abnormalities. There is mild left ventricular hypertrophy. Left ventricular diastolic parameters were normal.  2. Right ventricular systolic function is normal. The right ventricular size is normal. Tricuspid regurgitation signal is inadequate for assessing PA pressure.  3. Right atrial size was mildly dilated.  4. The mitral valve is normal in structure. No evidence of mitral valve  regurgitation. No evidence of mitral stenosis.  5. The aortic valve was not well visualized. Aortic valve regurgitation is not visualized. No aortic stenosis is present.  6. The inferior vena cava is normal in size with greater than 50% respiratory variability, suggesting right atrial pressure of 3 mmHg. FINDINGS  Left Ventricle: Left ventricular ejection fraction, by estimation, is 60 to 65%. The left ventricle has normal function. The left ventricle has no regional wall motion abnormalities. The left ventricular internal cavity size was normal in size. There is  mild left ventricular hypertrophy. Left ventricular diastolic parameters were normal. Right Ventricle: The right ventricular size is normal. No increase in right ventricular wall thickness. Right ventricular systolic function is normal. Tricuspid regurgitation signal is inadequate for assessing PA pressure. Left Atrium: Left atrial size was normal in size. Right Atrium: Right atrial size was mildly dilated. Pericardium: There is no evidence of pericardial effusion. Mitral Valve: The mitral valve is normal in structure. No evidence of mitral valve regurgitation. No evidence of mitral valve stenosis. Tricuspid Valve: The tricuspid valve is normal in structure. Tricuspid valve regurgitation is not demonstrated. Aortic Valve: The aortic valve was not well visualized. Aortic valve regurgitation is not visualized. No aortic stenosis is present. Aortic valve mean gradient measures 6.0 mmHg. Aortic valve peak gradient measures 10.1 mmHg. Aortic valve area, by VTI measures 2.06 cm. Pulmonic Valve: The pulmonic valve was not well visualized. Pulmonic valve regurgitation is not visualized. Aorta: The aortic root is normal in size and structure. Venous: The inferior vena cava is normal in size with greater than 50% respiratory variability, suggesting right atrial pressure of 3 mmHg. IAS/Shunts: The interatrial septum was not well visualized.  LEFT VENTRICLE PLAX 2D  LVIDd:         4.10 cm     Diastology LVIDs:         2.80 cm     LV e' medial:    8.92 cm/s LV PW:         1.20 cm     LV E/e' medial:  6.8 LV IVS:        1.30 cm     LV e' lateral:   10.40 cm/s LVOT diam:     2.10 cm     LV E/e' lateral: 5.8 LV SV:         63 LV SV Index:   31 LVOT Area:  3.46 cm  LV Volumes (MOD) LV vol d, MOD A2C: 97.1 ml LV vol d, MOD A4C: 98.3 ml LV vol s, MOD A2C: 37.6 ml LV vol s, MOD A4C: 37.5 ml LV SV MOD A2C:     59.5 ml LV SV MOD A4C:     98.3 ml LV SV MOD BP:      60.8 ml RIGHT VENTRICLE             IVC RV S prime:     12.90 cm/s  IVC diam: 1.50 cm TAPSE (M-mode): 2.4 cm LEFT ATRIUM             Index       RIGHT ATRIUM           Index LA diam:        2.40 cm 1.16 cm/m  RA Area:     23.50 cm LA Vol (A2C):   57.7 ml 27.94 ml/m RA Volume:   80.40 ml  38.93 ml/m LA Vol (A4C):   51.3 ml 24.84 ml/m LA Biplane Vol: 55.0 ml 26.63 ml/m  AORTIC VALVE AV Area (Vmax):    2.31 cm AV Area (Vmean):   1.87 cm AV Area (VTI):     2.06 cm AV Vmax:           159.00 cm/s AV Vmean:          115.000 cm/s AV VTI:            0.308 m AV Peak Grad:      10.1 mmHg AV Mean Grad:      6.0 mmHg LVOT Vmax:         106.00 cm/s LVOT Vmean:        62.000 cm/s LVOT VTI:          0.183 m LVOT/AV VTI ratio: 0.59  AORTA Ao Root diam: 3.70 cm MITRAL VALVE MV Area (PHT): 2.73 cm    SHUNTS MV Decel Time: 278 msec    Systemic VTI:  0.18 m MV E velocity: 60.60 cm/s  Systemic Diam: 2.10 cm MV A velocity: 73.70 cm/s MV E/A ratio:  0.82 Oswaldo Milian MD Electronically signed by Oswaldo Milian MD Signature Date/Time: 02/16/2021/6:10:09 PM    Final      PERTINENT LAB RESULTS: CBC: Recent Labs    02/16/21 0640 02/17/21 0418  WBC 8.8 9.0  HGB 12.7* 13.7  HCT 42.9 45.2  PLT 190 208   CMET CMP     Component Value Date/Time   NA 136 02/17/2021 0418   NA 139 08/05/2019 0927   K 4.1 02/17/2021 0418   CL 101 02/17/2021 0418   CO2 29 02/17/2021 0418   GLUCOSE 97 02/17/2021 0418   BUN 12  02/17/2021 0418   BUN 19 08/05/2019 0927   CREATININE 1.06 02/17/2021 0418   CALCIUM 9.3 02/17/2021 0418   PROT 6.4 (L) 02/15/2021 2347   PROT 6.8 08/05/2019 0927   ALBUMIN 3.7 02/15/2021 2347   ALBUMIN 4.1 08/05/2019 0927   AST 18 02/15/2021 2347   ALT 25 02/15/2021 2347   ALKPHOS 44 02/15/2021 2347   BILITOT 0.3 02/15/2021 2347   BILITOT 0.4 08/05/2019 0927   GFRNONAA >60 02/17/2021 0418   GFRAA 76 08/05/2019 0927    GFR CrCl cannot be calculated (Unknown ideal weight.). No results for input(s): LIPASE, AMYLASE in the last 72 hours. No results for input(s): CKTOTAL, CKMB, CKMBINDEX, TROPONINI in the last 72 hours. Invalid input(s): [place]  02/15/21 2347  DDIMER 1.34*   Recent Labs    02/16/21 0640  HGBA1C 6.6*   No results for input(s): CHOL, HDL, LDLCALC, TRIG, CHOLHDL, LDLDIRECT in the last 72 hours. No results for input(s): TSH, T4TOTAL, T3FREE, THYROIDAB in the last 72 hours.  Invalid input(s): FREET3 No results for input(s): VITAMINB12, FOLATE, FERRITIN, TIBC, IRON, RETICCTPCT in the last 72 hours. Coags: No results for input(s): INR in the last 72 hours.  Invalid input(s): PT Microbiology: Recent Results (from the past 240 hour(s))  Resp Panel by RT-PCR (Flu A&B, Covid) Nasopharyngeal Swab     Status: None   Collection Time: 02/16/21  5:28 AM   Specimen: Nasopharyngeal Swab; Nasopharyngeal(NP) swabs in vial transport medium  Result Value Ref Range Status   SARS Coronavirus 2 by RT PCR NEGATIVE NEGATIVE Final    Comment: (NOTE) SARS-CoV-2 target nucleic acids are NOT DETECTED.  The SARS-CoV-2 RNA is generally detectable in upper respiratory specimens during the acute phase of infection. The lowest concentration of SARS-CoV-2 viral copies this assay can detect is 138 copies/mL. A negative result does not preclude SARS-Cov-2 infection and should not be used as the sole basis for treatment or other patient management decisions. A negative  result may occur with  improper specimen collection/handling, submission of specimen other than nasopharyngeal swab, presence of viral mutation(s) within the areas targeted by this assay, and inadequate number of viral copies(<138 copies/mL). A negative result must be combined with clinical observations, patient history, and epidemiological information. The expected result is Negative.  Fact Sheet for Patients:  EntrepreneurPulse.com.au  Fact Sheet for Healthcare Providers:  IncredibleEmployment.be  This test is no t yet approved or cleared by the [place] FDA and  has been authorized for detection and/or diagnosis of SARS-CoV-2 by FDA under an Emergency Use Authorization (EUA). This EUA will remain  in effect (meaning this test can be used) for the duration of the COVID-19 declaration under Section 564(b)(1) of the Act, 21 U.S.C.section 360bbb-3(b)(1), unless the authorization is terminated  or revoked sooner.       Influenza A by PCR NEGATIVE NEGATIVE Final   Influenza B by PCR NEGATIVE NEGATIVE Final    Comment: (NOTE) The Xpert Xpress SARS-CoV-2/FLU/RSV plus assay is intended as an aid in the diagnosis of influenza from Nasopharyngeal swab specimens and should not be used as a sole basis for treatment. Nasal washings and aspirates are unacceptable for Xpert Xpress SARS-CoV-2/FLU/RSV testing.  Fact Sheet for Patients: EntrepreneurPulse.com.au  Fact Sheet for Healthcare Providers: IncredibleEmployment.be  This test is not yet approved or cleared by the [place] FDA and has been authorized for detection and/or diagnosis of SARS-CoV-2 by FDA under an Emergency Use Authorization (EUA). This EUA will remain in effect (meaning this test can be used) for the duration of the COVID-19 declaration under Section 564(b)(1) of the Act, 21 U.S.C. section 360bbb-3(b)(1), unless the authorization is  terminated or revoked.  Performed at [facility], [place] [address] [place], [place] [address]     FURTHER DISCHARGE INSTRUCTIONS:  Get Medicines reviewed and adjusted: Please take all your medications with you for your next visit with your Primary MD  Laboratory/radiological data: Please request your Primary MD to go over all hospital tests and procedure/radiological results at the follow up, please ask your Primary MD to get all Hospital records sent to his/her office.  In some cases, they will be blood work, cultures and biopsy results pending at the time of your discharge. Please request  that your primary care M.D. goes through all the records of your hospital data and follows up on these results.  Also Note the following: If you experience worsening of your admission symptoms, develop shortness of breath, life threatening emergency, suicidal or homicidal thoughts you must seek medical attention immediately by calling 911 or calling your MD immediately  if symptoms less severe.  You must read complete instructions/literature along with all the possible adverse reactions/side effects for all the Medicines you take and that have been prescribed to you. Take any new Medicines after you have completely understood and accpet all the possible adverse reactions/side effects.   Do not drive when taking Pain medications or sleeping medications (Benzodaizepines)  Do not take more than prescribed Pain, Sleep and Anxiety Medications. It is not advisable to combine anxiety,sleep and pain medications without talking with your primary care practitioner  Special Instructions: If you have smoked or chewed Tobacco  in the last 2 yrs please stop smoking, stop any regular Alcohol  and or any Recreational drug use.  Wear Seat belts while driving.  Please note: You were cared for by a hospitalist during your hospital stay. Once you are discharged, your primary care physician will handle any  further medical issues. Please note that NO REFILLS for any discharge medications will be authorized once you are discharged, as it is imperative that you return to your primary care physician (or establish a relationship with a primary care physician if you do not have one) for your post hospital discharge needs so that they can reassess your need for medications and monitor your lab values.  Total Time spent coordinating discharge including counseling, education and face to face time equals 35 minutes.  SignedOren Binet 02/17/2021 9:50 AM

## 2021-02-17 NOTE — Care Management Obs Status (Signed)
MEDICARE OBSERVATION STATUS NOTIFICATION   Patient Details  Name: Evan Brock MRN: [id number] Date of Birth: [birth year]   Medicare Observation Status Notification Given:  Yes    Kingsley Plan, RN 02/17/2021, 10:10 AM

## 2021-02-17 NOTE — Progress Notes (Signed)
Please see in addition to PT note:   SATURATION QUALIFICATIONS: (This note is used to comply with regulatory documentation for home oxygen)  Patient Saturations on Room Air at Rest = 92  Patient Saturations on Room Air while Ambulating = 89 but recovered to 92%  Patient Saturations on 2 Liters of oxygen while Ambulating = NT  Please briefly explain why patient needs home oxygen:N/A  Conley Rolls, SPT

## 2021-02-17 NOTE — Care Management CC44 (Signed)
Condition Code 44 Documentation Completed  Patient Details  Name: Evan Brock MRN: [id number] Date of Birth: February 07, 1951   Condition Code 44 given:  Yes Patient signature on Condition Code 44 notice:  Yes Documentation of 2 MD's agreement:  Yes Code 44 added to claim:  Yes    Kingsley Plan, RN 02/17/2021, 10:10 AM

## 2021-02-17 NOTE — Evaluation (Signed)
Physical Therapy Evaluation Patient Details Name: Evan Brock MRN: [id number] DOB: Sep 30, 1951 Today's Date: 02/17/2021   History of Present Illness  Evan Brock is a 70 y/o male who presented to ED following a near syncope event with complaints of nausea and sweating. Pt admitted on 02/15/21 with CAP. PMH includes AKI, essential HTN, COPD, B THA, OA, and Covid-19.    Clinical Impression  Pt received in chair, very cooperative and pleasant. Generally mod I for mobility. Received on RA. SPO2 dropped to 89%, but rose to >91% within 1-2 min with pursed lip breathing. Minimal cueing throughout ambulation for pacing. Pt reports being close to his baseline. Do not anticipate pt will need further PT follow up. Will sign off for now. Thank you for the opportunity to participate in this patient's care.    Follow Up Recommendations No PT follow up    Equipment Recommendations  None recommended by PT    Recommendations for Other Services       Precautions / Restrictions Precautions Precaution Comments: monitor O2 Restrictions Weight Bearing Restrictions: No      Mobility  Bed Mobility               General bed mobility comments: sitting in recliner upon arrival    Transfers Overall transfer level: Modified independent Equipment used: None Transfers: Sit to/from Stand Sit to Stand: Modified independent (Device/Increase time)            Ambulation/Gait Ambulation/Gait assistance: Modified independent (Device/Increase time) Gait Distance (Feet): 220 Feet Assistive device: None Gait Pattern/deviations: Step-through pattern Gait velocity: decreased   General Gait Details: steady, no LOB  Stairs Stairs: Yes Stairs assistance: Min guard Stair Management: Alternating pattern;One rail Right;Forwards Number of Stairs: 2 General stair comments: min guard for safety  Wheelchair Mobility    Modified Rankin (Stroke Patients Only)       Balance Overall balance assessment:  Modified Independent                                           Pertinent Vitals/Pain Pain Assessment: No/denies pain    Home Living Family/patient expects to be discharged to:: Private residence Living Arrangements: Spouse/significant other Available Help at Discharge: Family;Available 24 hours/day Type of Home: House Home Access: Level entry     Home Layout: Two level Home Equipment: Cane - single point;Walker - 2 wheels;Bedside commode      Prior Function Level of Independence: Independent         Comments: Independent with ADLs, likes to cook, able to walk ~100 yards before getting tired     Hand Dominance   Dominant Hand: Right    Extremity/Trunk Assessment   Upper Extremity Assessment Upper Extremity Assessment: Overall WFL for tasks assessed    Lower Extremity Assessment Lower Extremity Assessment: Overall WFL for tasks assessed       Communication   Communication: No difficulties  Cognition Arousal/Alertness: Awake/alert Behavior During Therapy: WFL for tasks assessed/performed Overall Cognitive Status: Within Functional Limits for tasks assessed                                 General Comments: A&Ox4      General Comments      Exercises     Assessment/Plan    PT Assessment Patent does not need any further PT services  PT Problem List         PT Treatment Interventions      PT Goals (Current goals can be found in the Care Plan section)  Acute Rehab PT Goals Patient Stated Goal: go home PT Goal Formulation: With patient Time For Goal Achievement: 03/03/21 Potential to Achieve Goals: Good    Frequency     Barriers to discharge        Co-evaluation               AM-PAC PT "6 Clicks" Mobility  Outcome Measure Help needed turning from your back to your side while in a flat bed without using bedrails?: None Help needed moving from lying on your back to sitting on the side of a flat bed without  using bedrails?: None Help needed moving to and from a bed to a chair (including a wheelchair)?: None Help needed standing up from a chair using your arms (e.g., wheelchair or bedside chair)?: None Help needed to walk in hospital room?: None Help needed climbing 3-5 steps with a railing? : A Little 6 Click Score: 23    End of Session Equipment Utilized During Treatment: Gait belt Activity Tolerance: Patient tolerated treatment well Patient left: in chair;with call bell/phone within reach;with family/visitor present Nurse Communication: Mobility status PT Visit Diagnosis: Unsteadiness on feet (R26.81)    Time:  -      Charges:             Conley Rolls, SPT

## 2021-02-17 NOTE — Progress Notes (Signed)
RN gave pt and his wife discharge instructions and he stated understanding. IV has been removed and pt is getting dressed, belongings with the patient.

## 2021-02-25 ENCOUNTER — Ambulatory Visit: Payer: Medicare HMO | Admitting: Emergency Medicine

## 2021-03-09 ENCOUNTER — Ambulatory Visit (INDEPENDENT_AMBULATORY_CARE_PROVIDER_SITE_OTHER)
Admission: RE | Admit: 2021-03-09 | Discharge: 2021-03-09 | Disposition: A | Discharge status: Home / Self Care | Payer: Medicare HMO | Source: Ambulatory Visit | Attending: Emergency Medicine | Admitting: Emergency Medicine

## 2021-03-09 ENCOUNTER — Other Ambulatory Visit: Payer: Self-pay

## 2021-03-09 DIAGNOSIS — Z77090 Contact with and (suspected) exposure to asbestos: Secondary | ICD-10-CM | POA: Diagnosis not present

## 2021-05-25 ENCOUNTER — Emergency Department (HOSPITAL_COMMUNITY)
Admission: EM | Admit: 2021-05-25 | Discharge: 2021-05-25 | Disposition: A | Discharge status: Home / Self Care | Payer: Medicare HMO | Attending: Emergency Medicine | Admitting: Emergency Medicine

## 2021-05-25 ENCOUNTER — Emergency Department (HOSPITAL_COMMUNITY): Payer: Medicare HMO

## 2021-05-25 ENCOUNTER — Encounter (HOSPITAL_COMMUNITY): Payer: Self-pay | Admitting: Emergency Medicine

## 2021-05-25 DIAGNOSIS — Z7951 Long term (current) use of inhaled steroids: Secondary | ICD-10-CM | POA: Insufficient documentation

## 2021-05-25 DIAGNOSIS — Z7984 Long term (current) use of oral hypoglycemic drugs: Secondary | ICD-10-CM | POA: Insufficient documentation

## 2021-05-25 DIAGNOSIS — Z96643 Presence of artificial hip joint, bilateral: Secondary | ICD-10-CM | POA: Diagnosis not present

## 2021-05-25 DIAGNOSIS — M545 Low back pain, unspecified: Secondary | ICD-10-CM | POA: Insufficient documentation

## 2021-05-25 DIAGNOSIS — K5900 Constipation, unspecified: Secondary | ICD-10-CM | POA: Insufficient documentation

## 2021-05-25 DIAGNOSIS — Z87891 Personal history of nicotine dependence: Secondary | ICD-10-CM | POA: Insufficient documentation

## 2021-05-25 DIAGNOSIS — E119 Type 2 diabetes mellitus without complications: Secondary | ICD-10-CM | POA: Diagnosis not present

## 2021-05-25 DIAGNOSIS — Z79899 Other long term (current) drug therapy: Secondary | ICD-10-CM | POA: Diagnosis not present

## 2021-05-25 DIAGNOSIS — R10824 Left lower quadrant rebound abdominal tenderness: Secondary | ICD-10-CM | POA: Diagnosis not present

## 2021-05-25 DIAGNOSIS — J441 Chronic obstructive pulmonary disease with (acute) exacerbation: Secondary | ICD-10-CM | POA: Insufficient documentation

## 2021-05-25 DIAGNOSIS — J45901 Unspecified asthma with (acute) exacerbation: Secondary | ICD-10-CM | POA: Diagnosis not present

## 2021-05-25 DIAGNOSIS — R109 Unspecified abdominal pain: Secondary | ICD-10-CM | POA: Insufficient documentation

## 2021-05-25 LAB — COMPREHENSIVE METABOLIC PANEL
ALT: 36 U/L (ref 0–44)
AST: 28 U/L (ref 15–41)
Albumin: 3.7 g/dL (ref 3.5–5.0)
Alkaline Phosphatase: 51 U/L (ref 38–126)
Anion gap: 8 (ref 5–15)
BUN: 21 mg/dL (ref 8–23)
CO2: 27 mmol/L (ref 22–32)
Calcium: 10 mg/dL (ref 8.9–10.3)
Chloride: 102 mmol/L (ref 98–111)
Creatinine, Ser: 1.32 mg/dL — ABNORMAL HIGH (ref 0.61–1.24)
GFR, Estimated: 58 mL/min — ABNORMAL LOW (ref >60)
Glucose, Bld: 96 mg/dL (ref 70–99)
Potassium: 4.2 mmol/L (ref 3.5–5.1)
Sodium: 137 mmol/L (ref 135–145)
Total Bilirubin: 0.7 mg/dL (ref 0.3–1.2)
Total Protein: 7.5 g/dL (ref 6.5–8.1)

## 2021-05-25 LAB — CBC WITH DIFFERENTIAL/PLATELET
Abs Immature Granulocytes: 0 10*3/uL (ref 0.00–0.07)
Basophils Absolute: 0.2 10*3/uL — ABNORMAL HIGH (ref 0.0–0.1)
Basophils Relative: 2 %
Eosinophils Absolute: 0 10*3/uL (ref 0.0–0.5)
Eosinophils Relative: 0 %
HCT: 47.7 % (ref 39.0–52.0)
Hemoglobin: 14.9 g/dL (ref 13.0–17.0)
Lymphocytes Relative: 20 %
Lymphs Abs: 2.2 10*3/uL (ref 0.7–4.0)
MCH: 27.1 pg (ref 26.0–34.0)
MCHC: 31.2 g/dL (ref 30.0–36.0)
MCV: 86.9 fL (ref 80.0–100.0)
Monocytes Absolute: 0.7 10*3/uL (ref 0.1–1.0)
Monocytes Relative: 6 %
Neutro Abs: 8.1 10*3/uL — ABNORMAL HIGH (ref 1.7–7.7)
Neutrophils Relative %: 72 %
Platelets: 264 10*3/uL (ref 150–400)
RBC: 5.49 MIL/uL (ref 4.22–5.81)
RDW: 14.5 % (ref 11.5–15.5)
WBC: 11.2 10*3/uL — ABNORMAL HIGH (ref 4.0–10.5)
nRBC: 0 % (ref 0.0–0.2)
nRBC: 1 /100 WBC — ABNORMAL HIGH

## 2021-05-25 LAB — URINALYSIS, ROUTINE W REFLEX MICROSCOPIC
Bilirubin Urine: NEGATIVE
Glucose, UA: NEGATIVE mg/dL
Hgb urine dipstick: NEGATIVE
Ketones, ur: NEGATIVE mg/dL
Leukocytes,Ua: NEGATIVE
Nitrite: NEGATIVE
Protein, ur: NEGATIVE mg/dL
Specific Gravity, Urine: 1.019 (ref 1.005–1.030)
pH: 6 (ref 5.0–8.0)

## 2021-05-25 MED ORDER — METHOCARBAMOL 500 MG PO TABS: 500.0000 mg | ORAL_TABLET | Freq: Two times a day (BID) | ORAL | 0 refills | Status: DC | PRN

## 2021-05-25 MED ORDER — MORPHINE SULFATE (PF) 4 MG/ML IV SOLN
4.0000 mg | Freq: Once | INTRAVENOUS | Status: AC
  Administered 2021-05-25: 4 mg via INTRAVENOUS
  Filled 2021-05-25: qty 1

## 2021-05-25 NOTE — ED Notes (Signed)
Dc instructions reviewed with pt. PT verbalized understanding. PT DC 

## 2021-05-25 NOTE — ED Triage Notes (Signed)
Patient complains of left lower back pain that started two days ago. Patient states cold compresses had no effect, heat pack reduced pain minimally temporarily. Patient also reports constipation, states he normally has a bowel movement every day but his last bowel movement was Sunday. Patient alert, oriented, ambulatory, and in no apparent distress at this time.

## 2021-05-25 NOTE — ED Provider Notes (Signed)
Emergency Medicine Provider Triage Evaluation Note  Evan Brock , a 70 y.o. male  was evaluated in triage.  Pt complains of left-sided back pain and constipation.  Pain has been going on for a few days.  He has tried to treat it symptomatically without improvement.  He also reports no bowel movement since Sunday, this is atypical for him.  He is not passing normal amount of gas.  No history of kidney stones.  No previous abdominal surgeries.  No abdominal pain.  No nausea or vomiting.  Review of Systems  Positive: Back pain Negative: N/v  Physical Exam  BP 128/73 (BP Location: Left Arm)   Pulse (!) 102   Temp 98 F (36.7 C) (Oral)   Resp 18   SpO2 100%  Gen:   Awake, no distress   Resp:  Normal effort  MSK:   Moves extremities without difficulty  Other:  No TTP of the abdomen.  There is left CVA tenderness.  Medical Decision Making  Medically screening exam initiated at 3:08 PM.  Appropriate orders placed.  Evan Brock was informed that the remainder of the evaluation will be completed by another provider, this initial triage assessment does not replace that evaluation, and the importance of remaining in the ED until their evaluation is complete.  Labs, ua, ct renal   Alveria Apley, PA-C 05/25/21 1509    Gloris Manchester, MD 05/27/21 [phone]

## 2021-05-25 NOTE — Discharge Instructions (Addendum)
I am prescribing you a strong muscle relaxer called Robaxin.  You can take this up to 2 times a day for management of your pain.  This medication can be sedating so do not mix it with alcohol.  Do not drive a car after taking it.  Stop taking your tizanidine while taking this medication.  Please continue to monitor symptoms closely.  If they worsen, please come back to the emergency department.  Please also make sure you follow-up with your regular doctor.  It was a pleasure to meet you.

## 2021-05-25 NOTE — ED Provider Notes (Signed)
[facility] EMERGENCY DEPARTMENT Provider Note   CSN: [id number] Arrival date & time: 05/25/21  1448     History Chief Complaint  Patient presents with   Back Pain    Evan Brock is a 70 y.o. male.  HPI Patient is a 70 year old male with a medical history as noted below.  He presents to the emergency department due to left-sided back pain that started about 3 days ago.  States his pain has been intermittent but progressively worsening.  He has been taking OTC medications with little to no improvement.  States he typically has daily bowel movements but has not had a bowel movement since the onset of his symptoms.  Feels that he is not passing his normal amount of gas.  No known history of kidney stones.  No previous abdominal surgeries.  Denies any abdominal pain, nausea, vomiting.  Denies any urinary complaints.    Past Medical History:  Diagnosis Date   Arthritis    "hips" (10/01/2013)   Asthma    COPD (chronic obstructive pulmonary disease) (HCC)    Exertional shortness of breath    Hypertension     Patient Active Problem List   Diagnosis Date Noted   CAP (community acquired pneumonia) 02/16/2021   Hypoxia 02/16/2021   COPD with acute exacerbation ([place]) 02/16/2021   Diabetes mellitus type 2 in obese ([place]) 02/16/2021   AKI (acute kidney injury) ([place]) 02/16/2021   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   Leukocytosis 10/04/2015   COPD (chronic obstructive pulmonary disease) ([place]) 10/01/2015   Essential hypertension, benign 03/01/2015   Asthma with acute exacerbation 01/26/2015   Thrombocytopenia, unspecified ([place]) 11/06/2013   Acute respiratory failure ([place]) with hypoxia 10/01/2013   Acute renal failure ([place]) 10/01/2013    Past Surgical History:  Procedure Laterality Date   JOINT REPLACEMENT     TOTAL HIP ARTHROPLASTY Right 2010   TOTAL HIP ARTHROPLASTY Left 08/19/2013   Procedure: TOTAL HIP ARTHROPLASTY ANTERIOR APPROACH;  Surgeon: Hessie Dibble, MD;  Location: [facility];  Service: Orthopedics;  Laterality: Left;       Family History  Problem Relation Age of Onset   Diabetes Mother    Hypertension Father    Lupus Sister     Social History   Tobacco Use   Smoking status: Former    Packs/day: 0.00    Years: 0.00    Pack years: 0.00    Types: Cigarettes    Quit date: 10/23/1990    Years since quitting: 30.6   Smokeless tobacco: Never   Tobacco comments:    10/01/2013 "quit smoking cigarettes~ 15-20 yr ago"  Substance Use Topics   Alcohol use: Yes    Alcohol/week: 12.0 standard drinks    Types: 12 Cans of beer per week    Comment: 08/19/2013 "3 times/wk; 3-4 beers/night"   Drug use: Yes    Types: Marijuana    Comment: 10/01/2013 "last drug use was 10 yr ago"    Home Medications Prior to Admission medications   Medication Sig Start Date End Date Taking? Authorizing Provider  methocarbamol (ROBAXIN) 500 MG tablet Take 1 tablet (500 mg total) by mouth 2 (two) times daily as needed for muscle spasms. 05/25/21  Yes Rayna Sexton, PA-C  albuterol (PROVENTIL) (2.5 MG/3ML) 0.083% nebulizer solution Take 2.5 mg by nebulization every 6 (six) hours as needed for wheezing or shortness of breath. 02/07/21   [provider]  albuterol (VENTOLIN HFA) 108 (90 Base) MCG/ACT inhaler INHALE 1 PUFF  FOUR TIMES DAILY, AS NEEDED 03/12/20 03/12/21  Fredrich Romans, PA  amLODipine (NORVASC) 10 MG tablet Take 1 tablet (10 mg total) by mouth daily. 11/05/19   Kerin Perna, NP  atenolol (TENORMIN) 50 MG tablet Take 1 tablet (50 mg total) by mouth daily. 11/05/19   Kerin Perna, NP  atorvastatin (LIPITOR) 40 MG tablet Take 1 tablet (40 mg total) by mouth daily. 11/05/19   Kerin Perna, NP  Blood Pressure Monitor KIT 1 kit by Does not apply route 3 (three) times daily as needed. 11/05/19   Kerin Perna, NP  Budeson-Glycopyrrol-Formoterol (BREZTRI AEROSPHERE) 160-9-4.8 MCG/ACT AERO Inhale 2 puffs into the lungs in  the morning and at bedtime. 05/19/20   Collene Gobble, MD  chlorthalidone (HYGROTON) 25 MG tablet Take 25 mg by mouth daily. 12/09/20   [provider]  cloNIDine (CATAPRES) 0.1 MG tablet Take 1 tablet (0.1 mg total) by mouth daily. 11/05/19   Kerin Perna, NP  gabapentin (NEURONTIN) 800 MG tablet Take 800 mg by mouth 3 (three) times daily. 01/27/21   [provider]  HYDROcodone-acetaminophen (NORCO) 10-325 MG tablet Take 1 tablet by mouth every 6 (six) hours as needed for moderate pain or severe pain. 02/14/21   [provider]  losartan (COZAAR) 50 MG tablet Take 1 tablet (50 mg total) by mouth daily. 11/05/19   Kerin Perna, NP  metFORMIN (GLUCOPHAGE) 500 MG tablet Take 1 tablet (500 mg total) by mouth 2 (two) times daily with a meal. 11/05/19   Kerin Perna, NP    Allergies    Patient has no known allergies.  Review of Systems   Review of Systems  All other systems reviewed and are negative. Ten systems reviewed and are negative for acute change, except as noted in the HPI.   Physical Exam Updated Vital Signs BP 133/86 (BP Location: Left Arm)   Pulse 86   Temp 98 F (36.7 C) (Oral)   Resp 16   SpO2 96%   Physical Exam Vitals and nursing note reviewed.  Constitutional:      General: He is not in acute distress.    Appearance: Normal appearance. He is not ill-appearing, toxic-appearing or diaphoretic.  HENT:     Head: Normocephalic and atraumatic.     Right Ear: External ear normal.     Left Ear: External ear normal.     Nose: Nose normal.     Mouth/Throat:     Mouth: Mucous membranes are moist.     Pharynx: Oropharynx is clear. No oropharyngeal exudate or posterior oropharyngeal erythema.  Eyes:     Extraocular Movements: Extraocular movements intact.  Cardiovascular:     Rate and Rhythm: Normal rate and regular rhythm.     Pulses: Normal pulses.     Heart sounds: Normal heart sounds. No murmur heard.   No friction rub. No  gallop.  Pulmonary:     Effort: Pulmonary effort is normal. No respiratory distress.     Breath sounds: Normal breath sounds. No stridor. No wheezing, rhonchi or rales.  Abdominal:     General: Abdomen is flat.     Palpations: Abdomen is soft.     Tenderness: There is no abdominal tenderness. There is left CVA tenderness. There is no right CVA tenderness.     Comments: Protuberant abdomen that is soft and nontender.  Moderate tenderness noted with gentle fist strike along the left CVA.  No right CVA tenderness.  Musculoskeletal:  General: Normal range of motion.     Cervical back: Normal range of motion and neck supple. No tenderness.  Skin:    General: Skin is warm and dry.  Neurological:     General: No focal deficit present.     Mental Status: He is alert and oriented to person, place, and time.  Psychiatric:        Mood and Affect: Mood normal.        Behavior: Behavior normal.    ED Results / Procedures / Treatments   Labs (all labs ordered are listed, but only abnormal results are displayed) Labs Reviewed  CBC WITH DIFFERENTIAL/PLATELET - Abnormal; Notable for the following components:      Result Value   WBC 11.2 (*)    Neutro Abs 8.1 (*)    Basophils Absolute 0.2 (*)    nRBC 1 (*)    All other components within normal limits  COMPREHENSIVE METABOLIC PANEL - Abnormal; Notable for the following components:   Creatinine, Ser 1.32 (*)    GFR, Estimated 58 (*)    All other components within normal limits  URINALYSIS, ROUTINE W REFLEX MICROSCOPIC    EKG None  Radiology CT Renal Stone Study  Result Date: 05/25/2021 CLINICAL DATA:  Back and flank pain for 2 days. EXAM: CT ABDOMEN AND PELVIS WITHOUT CONTRAST TECHNIQUE: Multidetector CT imaging of the abdomen and pelvis was performed following the standard protocol without IV contrast. COMPARISON:  06/10/2013 FINDINGS: Lower chest: Bilateral lower lobe bronchiectasis along with patchy tree-in-bud opacities suggesting  chronic inflammation or atypical infection such as MAC. No focal airspace consolidation, pleural effusion or pulmonary lesions. The heart is normal in size. Aortic and coronary artery calcifications are noted. Hepatobiliary: No hepatic lesions or intrahepatic biliary dilatation. The gallbladder is unremarkable. No common bile duct dilatation. Pancreas: No mass, inflammation or ductal dilatation. Spleen: Normal size.  No focal lesions. Adrenals/Urinary Tract: Adrenal glands are unremarkable. Low no worrisome renal lesions are identified without contrast. Scattered vascular calcifications but no definite renal or obstructing ureteral calculi. No obvious bladder calculi or bladder mass but the bladder is partially obscured by artifact from bilateral hip prostheses. Stomach/Bowel: The stomach, duodenum, small bowel and colon are unremarkable. No acute inflammatory changes, mass lesions or obstructive findings. The terminal ileum is normal. The appendix is normal. There is moderate scattered colonic diverticulosis but no findings for acute diverticulitis. No significant stool burden. Vascular/Lymphatic: Moderate atherosclerotic calcifications involving the aorta and iliac arteries but no aneurysm. No mesenteric or retroperitoneal mass or adenopathy. Reproductive: The prostate gland and seminal vesicles are grossly normal. Other: No pelvic mass or adenopathy. No free pelvic fluid collections. No inguinal mass or adenopathy. No abdominal wall hernia or subcutaneous lesions. Musculoskeletal: No acute bony findings or worrisome bone lesions. There are bilateral total hip arthroplasties with associated artifact. No obvious complicating features are identified. Areas of heterotopic ossification are noted around the right hip. IMPRESSION: 1. No acute abdominal/pelvic findings, mass lesions or adenopathy. 2. No renal, ureteral or bladder calculi or mass. 3. Bilateral lower lobe bronchiectasis and patchy tree-in-bud opacities  suggesting chronic inflammation or atypical infection such as MAC. 4. Aortic atherosclerosis. Aortic Atherosclerosis (ICD10-I70.0). Electronically Signed   By: Marijo Sanes M.D.   On: 05/25/2021 18:55    Procedures Procedures   Medications Ordered in ED Medications  morphine 4 MG/ML injection 4 mg (4 mg Intravenous Given 05/25/21 1853)    ED Course  I have reviewed the triage vital signs and the  nursing notes.  Pertinent labs & imaging results that were available during my care of the patient were reviewed by me and considered in my medical decision making (see chart for details).    MDM Rules/Calculators/A&P                          Pt is a 70 y.o. male who presents to the emergency department due to left low back pain.  Labs: CBC with WBCs of 11.2, neutrophils of 8.1, absolute basophil's of 0.20 and and nRBCs of 1. CMP with a creatinine of 1.32 and a GFR 58. UA without abnormalities.  Imaging: CT renal stone study shows no acute abdominal/pelvic findings, mass lesions, or adenopathy.  No renal, ureteral, or bladder calculi or mass.  Bilateral lower lobe bronchiectasis and patchy tree-in-bud opacities suggesting chronic inflammation or atypical infection such as MAC.  I, Rayna Sexton, PA-C, personally reviewed and evaluated these images and lab results as part of my medical decision-making.  UA and CT scan reassuring.  Patient's symptoms are likely musculoskeletal.  He has been taking tizanidine with little relief at home.  We will start him on a course of Robaxin to see if this improves his symptoms.  He understands to stop taking tizanidine while taking this medication.  We discussed safety regarding this medication.  CT concerning for tree-in-bud opacities.  Patient has a known history of COPD and denies any recent cough, fevers, or worsening shortness of breath.  This is likely chronic inflammation due to his COPD.  Feel the patient is stable for discharge at this time and  he is agreeable.  Discussed return precautions at length.  Recommended PCP follow-up.  His questions were answered and he was amicable at the time of discharge.  Note: Portions of this report may have been transcribed using voice recognition software. Every effort was made to ensure accuracy; however, inadvertent computerized transcription errors may be present.   Final Clinical Impression(s) / ED Diagnoses Final diagnoses:  Acute left-sided low back pain without sciatica   Rx / DC Orders ED Discharge Orders          Ordered    methocarbamol (ROBAXIN) 500 MG tablet  2 times daily PRN        05/25/21 1905             Rayna Sexton, PA-C 05/25/21 1908    Margette Fast, MD 05/26/21 1120

## 2021-08-25 ENCOUNTER — Emergency Department (HOSPITAL_COMMUNITY): Payer: Medicare HMO

## 2021-08-25 ENCOUNTER — Emergency Department (HOSPITAL_COMMUNITY)
Admission: EM | Admit: 2021-08-25 | Discharge: 2021-08-25 | Disposition: A | Discharge status: Home / Self Care | Payer: Medicare HMO | Attending: Emergency Medicine | Admitting: Emergency Medicine

## 2021-08-25 ENCOUNTER — Ambulatory Visit (HOSPITAL_COMMUNITY)
Admission: EM | Admit: 2021-08-25 | Discharge: 2021-08-25 | Disposition: A | Discharge status: Home / Self Care | Payer: Medicare HMO

## 2021-08-25 ENCOUNTER — Encounter (HOSPITAL_COMMUNITY): Payer: Self-pay | Admitting: *Deleted

## 2021-08-25 ENCOUNTER — Encounter (HOSPITAL_COMMUNITY): Payer: Self-pay

## 2021-08-25 ENCOUNTER — Other Ambulatory Visit: Payer: Self-pay

## 2021-08-25 DIAGNOSIS — J441 Chronic obstructive pulmonary disease with (acute) exacerbation: Secondary | ICD-10-CM | POA: Diagnosis not present

## 2021-08-25 DIAGNOSIS — R0902 Hypoxemia: Secondary | ICD-10-CM

## 2021-08-25 DIAGNOSIS — E119 Type 2 diabetes mellitus without complications: Secondary | ICD-10-CM | POA: Diagnosis not present

## 2021-08-25 DIAGNOSIS — Z87891 Personal history of nicotine dependence: Secondary | ICD-10-CM | POA: Diagnosis not present

## 2021-08-25 DIAGNOSIS — Z7951 Long term (current) use of inhaled steroids: Secondary | ICD-10-CM | POA: Insufficient documentation

## 2021-08-25 DIAGNOSIS — Z79899 Other long term (current) drug therapy: Secondary | ICD-10-CM | POA: Diagnosis not present

## 2021-08-25 DIAGNOSIS — J9601 Acute respiratory failure with hypoxia: Secondary | ICD-10-CM | POA: Insufficient documentation

## 2021-08-25 DIAGNOSIS — Z96643 Presence of artificial hip joint, bilateral: Secondary | ICD-10-CM | POA: Insufficient documentation

## 2021-08-25 DIAGNOSIS — Z20822 Contact with and (suspected) exposure to covid-19: Secondary | ICD-10-CM | POA: Insufficient documentation

## 2021-08-25 DIAGNOSIS — Z7984 Long term (current) use of oral hypoglycemic drugs: Secondary | ICD-10-CM | POA: Insufficient documentation

## 2021-08-25 DIAGNOSIS — I214 Non-ST elevation (NSTEMI) myocardial infarction: Secondary | ICD-10-CM | POA: Insufficient documentation

## 2021-08-25 DIAGNOSIS — Z7982 Long term (current) use of aspirin: Secondary | ICD-10-CM | POA: Diagnosis not present

## 2021-08-25 DIAGNOSIS — I10 Essential (primary) hypertension: Secondary | ICD-10-CM | POA: Insufficient documentation

## 2021-08-25 DIAGNOSIS — R0602 Shortness of breath: Secondary | ICD-10-CM | POA: Diagnosis not present

## 2021-08-25 DIAGNOSIS — J45909 Unspecified asthma, uncomplicated: Secondary | ICD-10-CM | POA: Diagnosis not present

## 2021-08-25 LAB — BASIC METABOLIC PANEL
Anion gap: 10 (ref 5–15)
BUN: 19 mg/dL (ref 8–23)
CO2: 27 mmol/L (ref 22–32)
Calcium: 9.8 mg/dL (ref 8.9–10.3)
Chloride: 98 mmol/L (ref 98–111)
Creatinine, Ser: 1.37 mg/dL — ABNORMAL HIGH (ref 0.61–1.24)
GFR, Estimated: 55 mL/min — ABNORMAL LOW (ref >60)
Glucose, Bld: 115 mg/dL — ABNORMAL HIGH (ref 70–99)
Potassium: 4.3 mmol/L (ref 3.5–5.1)
Sodium: 135 mmol/L (ref 135–145)

## 2021-08-25 LAB — CBC WITH DIFFERENTIAL/PLATELET
Abs Immature Granulocytes: 0.07 10*3/uL (ref 0.00–0.07)
Basophils Absolute: 0.1 10*3/uL (ref 0.0–0.1)
Basophils Relative: 0 %
Eosinophils Absolute: 0.1 10*3/uL (ref 0.0–0.5)
Eosinophils Relative: 1 %
HCT: 45.1 % (ref 39.0–52.0)
Hemoglobin: 14.3 g/dL (ref 13.0–17.0)
Immature Granulocytes: 0 %
Lymphocytes Relative: 7 %
Lymphs Abs: 1.2 10*3/uL (ref 0.7–4.0)
MCH: 26.7 pg (ref 26.0–34.0)
MCHC: 31.7 g/dL (ref 30.0–36.0)
MCV: 84.1 fL (ref 80.0–100.0)
Monocytes Absolute: 1.4 10*3/uL — ABNORMAL HIGH (ref 0.1–1.0)
Monocytes Relative: 9 %
Neutro Abs: 13.7 10*3/uL — ABNORMAL HIGH (ref 1.7–7.7)
Neutrophils Relative %: 83 %
Platelets: 289 10*3/uL (ref 150–400)
RBC: 5.36 MIL/uL (ref 4.22–5.81)
RDW: 13.7 % (ref 11.5–15.5)
WBC: 16.7 10*3/uL — ABNORMAL HIGH (ref 4.0–10.5)
nRBC: 0 % (ref 0.0–0.2)

## 2021-08-25 LAB — I-STAT VENOUS BLOOD GAS, ED
Acid-Base Excess: 6 mmol/L — ABNORMAL HIGH (ref 0.0–2.0)
Bicarbonate: 32.9 mmol/L — ABNORMAL HIGH (ref 20.0–28.0)
Calcium, Ion: 1.18 mmol/L (ref 1.15–1.40)
HCT: 45 % (ref 39.0–52.0)
Hemoglobin: 15.3 g/dL (ref 13.0–17.0)
O2 Saturation: 27 %
Potassium: 4.1 mmol/L (ref 3.5–5.1)
Sodium: 136 mmol/L (ref 135–145)
TCO2: 35 mmol/L — ABNORMAL HIGH (ref 22–32)
pCO2, Ven: 54.8 mmHg (ref 44.0–60.0)
pH, Ven: 7.387 (ref 7.250–7.430)
pO2, Ven: 19 mmHg — CL (ref 32.0–45.0)

## 2021-08-25 LAB — TROPONIN I (HIGH SENSITIVITY)
Troponin I (High Sensitivity): 20 ng/L — ABNORMAL HIGH (ref <18)
Troponin I (High Sensitivity): 11 ng/L (ref <18)

## 2021-08-25 LAB — LACTIC ACID, PLASMA
Lactic Acid, Venous: 1.2 mmol/L (ref 0.5–1.9)
Lactic Acid, Venous: 1.4 mmol/L (ref 0.5–1.9)

## 2021-08-25 LAB — RESP PANEL BY RT-PCR (FLU A&B, COVID) ARPGX2
Influenza A by PCR: NEGATIVE
Influenza B by PCR: NEGATIVE
SARS Coronavirus 2 by RT PCR: NEGATIVE

## 2021-08-25 LAB — BRAIN NATRIURETIC PEPTIDE: B Natriuretic Peptide: 23.9 pg/mL (ref 0.0–100.0)

## 2021-08-25 LAB — D-DIMER, QUANTITATIVE: D-Dimer, Quant: 1.83 ug/mL-FEU — ABNORMAL HIGH (ref 0.00–0.50)

## 2021-08-25 MED ORDER — IOHEXOL 350 MG/ML SOLN
50.0000 mL | Freq: Once | INTRAVENOUS | Status: AC | PRN
  Administered 2021-08-25: 50 mL via INTRAVENOUS

## 2021-08-25 MED ORDER — ASPIRIN 81 MG PO CHEW: 324.0000 mg | CHEWABLE_TABLET | Freq: Once | ORAL | 0 refills | Status: AC

## 2021-08-25 MED ORDER — IPRATROPIUM-ALBUTEROL 0.5-2.5 (3) MG/3ML IN SOLN
3.0000 mL | Freq: Once | RESPIRATORY_TRACT | Status: AC
  Administered 2021-08-25: 3 mL via RESPIRATORY_TRACT
  Filled 2021-08-25: qty 3

## 2021-08-25 MED ORDER — GABAPENTIN 300 MG PO CAPS
400.0000 mg | ORAL_CAPSULE | Freq: Once | ORAL | Status: AC
  Administered 2021-08-25: 400 mg via ORAL
  Filled 2021-08-25: qty 1

## 2021-08-25 MED ORDER — DOXYCYCLINE HYCLATE 100 MG PO CAPS: 100.0000 mg | ORAL_CAPSULE | Freq: Two times a day (BID) | ORAL | 0 refills | Status: DC

## 2021-08-25 MED ORDER — PREDNISONE 20 MG PO TABS: 20.0000 mg | ORAL_TABLET | Freq: Every day | ORAL | 0 refills | Status: DC

## 2021-08-25 MED ORDER — METHYLPREDNISOLONE SODIUM SUCC 125 MG IJ SOLR
60.0000 mg | Freq: Once | INTRAMUSCULAR | Status: AC
  Administered 2021-08-25: 60 mg via INTRAVENOUS
  Filled 2021-08-25: qty 2

## 2021-08-25 NOTE — ED Provider Notes (Signed)
[facility] EMERGENCY DEPARTMENT Provider Note   CSN: [id number] Arrival date & time: 08/25/21  1136     History Chief Complaint  Patient presents with   Shortness of Breath    Earsel Brock is a 70 y.o. male.  This is a 70 y.o. male with significant medical history as below, including COPD, prior tobacco use, T2DM who presents to the ED with complaint of cough/ hypoxia.  Patient was sent by urgent care secondary to hypoxia, 88% on room air.  Patient reports cough over the past 4 days with Evan Brock/white sputum.  Worsening, constant. No home oxygen use.  Dyspnea worsened with exertion.  No chest pain, nausea, vomiting, diaphoresis or lightheadedness.  No recent travel or sick contacts, no fevers or chills, no changes to oral intake or daily medications.  He has been using his home nebulizer as prescribed without much improvement to his respiratory status. No longer smoking. No rashes.    The history is provided by the patient. No language interpreter was used.  Shortness of Breath Severity:  Mild Onset quality:  Gradual Timing:  Constant Progression:  Worsening Chronicity:  Recurrent Associated symptoms: cough   Associated symptoms: no abdominal pain, no chest pain, no fever, no headaches, no rash and no vomiting       Past Medical History:  Diagnosis Date   Arthritis    "hips" (10/01/2013)   Asthma    COPD (chronic obstructive pulmonary disease) (HCC)    Exertional shortness of breath    Hypertension     Patient Active Problem List   Diagnosis Date Noted   CAP (community acquired pneumonia) 02/16/2021   Hypoxia 02/16/2021   COPD with acute exacerbation ([place]) 02/16/2021   Diabetes mellitus type 2 in obese ([place]) 02/16/2021   AKI (acute kidney injury) ([place]) 02/16/2021   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   Leukocytosis 10/04/2015   COPD (chronic obstructive pulmonary disease) ([place]) 10/01/2015   Essential hypertension, benign 03/01/2015   Asthma with  acute exacerbation 01/26/2015   Thrombocytopenia, unspecified ([place]) 11/06/2013   Acute respiratory failure ([place]) with hypoxia 10/01/2013   Acute renal failure ([place]) 10/01/2013    Past Surgical History:  Procedure Laterality Date   JOINT REPLACEMENT     TOTAL HIP ARTHROPLASTY Right 2010   TOTAL HIP ARTHROPLASTY Left 08/19/2013   Procedure: TOTAL HIP ARTHROPLASTY ANTERIOR APPROACH;  Surgeon: Hessie Dibble, MD;  Location: [facility];  Service: Orthopedics;  Laterality: Left;       Family History  Problem Relation Age of Onset   Diabetes Mother    Hypertension Father    Lupus Sister     Social History   Tobacco Use   Smoking status: Former    Packs/day: 0.00    Years: 0.00    Pack years: 0.00    Types: Cigarettes    Quit date: 10/23/1990    Years since quitting: 30.8   Smokeless tobacco: Never   Tobacco comments:    10/01/2013 "quit smoking cigarettes~ 15-20 yr ago"  Substance Use Topics   Alcohol use: Yes    Alcohol/week: 12.0 standard drinks    Types: 12 Cans of beer per week    Comment: 08/19/2013 "3 times/wk; 3-4 beers/night"   Drug use: Yes    Types: Marijuana    Comment: 10/01/2013 "last drug use was 10 yr ago"    Home Medications Prior to Admission medications   Medication Sig Start Date End Date Taking? Authorizing Provider  aspirin 81 MG chewable  tablet Chew 4 tablets (324 mg total) by mouth once for 1 dose. 08/25/21 08/25/21 Yes Wynona Dove A, DO  albuterol (PROVENTIL) (2.5 MG/3ML) 0.083% nebulizer solution Take 2.5 mg by nebulization every 6 (six) hours as needed for wheezing or shortness of breath. 02/07/21   [provider]  albuterol (VENTOLIN HFA) 108 (90 Base) MCG/ACT inhaler INHALE 1 PUFF FOUR TIMES DAILY, AS NEEDED 03/12/20 03/12/21  Fredrich Romans, PA  amLODipine (NORVASC) 10 MG tablet Take 1 tablet (10 mg total) by mouth daily. 11/05/19   Kerin Perna, NP  atenolol (TENORMIN) 50 MG tablet Take 1 tablet (50 mg total) by mouth daily. 11/05/19    Kerin Perna, NP  atorvastatin (LIPITOR) 40 MG tablet Take 1 tablet (40 mg total) by mouth daily. 11/05/19   Kerin Perna, NP  Blood Pressure Monitor KIT 1 kit by Does not apply route 3 (three) times daily as needed. 11/05/19   Kerin Perna, NP  Budeson-Glycopyrrol-Formoterol (BREZTRI AEROSPHERE) 160-9-4.8 MCG/ACT AERO Inhale 2 puffs into the lungs in the morning and at bedtime. 05/19/20   Collene Gobble, MD  chlorthalidone (HYGROTON) 25 MG tablet Take 25 mg by mouth daily. 12/09/20   [provider]  cloNIDine (CATAPRES) 0.1 MG tablet Take 1 tablet (0.1 mg total) by mouth daily. 11/05/19   Kerin Perna, NP  gabapentin (NEURONTIN) 800 MG tablet Take 800 mg by mouth 3 (three) times daily. 01/27/21   [provider]  HYDROcodone-acetaminophen (NORCO) 10-325 MG tablet Take 1 tablet by mouth every 6 (six) hours as needed for moderate pain or severe pain. 02/14/21   [provider]  losartan (COZAAR) 50 MG tablet Take 1 tablet (50 mg total) by mouth daily. 11/05/19   Kerin Perna, NP  metFORMIN (GLUCOPHAGE) 500 MG tablet Take 1 tablet (500 mg total) by mouth 2 (two) times daily with a meal. 11/05/19   Kerin Perna, NP  methocarbamol (ROBAXIN) 500 MG tablet Take 1 tablet (500 mg total) by mouth 2 (two) times daily as needed for muscle spasms. 05/25/21   Rayna Sexton, PA-C    Allergies    Patient has no known allergies.  Review of Systems   Review of Systems  Constitutional:  Negative for chills and fever.  HENT:  Negative for facial swelling and trouble swallowing.   Eyes:  Negative for photophobia and visual disturbance.  Respiratory:  Positive for cough and shortness of breath.   Cardiovascular:  Negative for chest pain and palpitations.  Gastrointestinal:  Negative for abdominal pain, nausea and vomiting.  Endocrine: Negative for polydipsia and polyuria.  Genitourinary:  Negative for difficulty urinating and hematuria.   Musculoskeletal:  Negative for gait problem and joint swelling.  Skin:  Negative for pallor and rash.  Neurological:  Negative for syncope and headaches.  Psychiatric/Behavioral:  Negative for agitation and confusion.    Physical Exam Updated Vital Signs BP 110/68 (BP Location: Right Arm)   Pulse 94   Temp 98.7 F (37.1 C) (Oral)   Resp (!) 22   SpO2 98%   Physical Exam Vitals and nursing note reviewed.  Constitutional:      General: He is in acute distress.     Appearance: He is well-developed. He is obese.  HENT:     Head: Normocephalic and atraumatic.     Right Ear: External ear normal.     Left Ear: External ear normal.     Mouth/Throat:     Mouth: Mucous membranes are  moist.  Eyes:     General: No scleral icterus. Cardiovascular:     Rate and Rhythm: Normal rate and regular rhythm.     Pulses: Normal pulses.     Heart sounds: Normal heart sounds.  Pulmonary:     Effort: Tachypnea and accessory muscle usage present. No respiratory distress.     Breath sounds: Examination of the right-lower field reveals wheezing. Examination of the left-lower field reveals wheezing. Decreased breath sounds and wheezing present.     Comments: Wheezing L > 5 Conversational dyspnea Abdominal:     General: Abdomen is flat.     Palpations: Abdomen is soft.     Tenderness: There is no abdominal tenderness.  Musculoskeletal:        General: Normal range of motion.     Cervical back: Normal range of motion.     Right lower leg: No edema.     Left lower leg: No edema.  Skin:    General: Skin is warm and dry.     Capillary Refill: Capillary refill takes less than 2 seconds.  Neurological:     Mental Status: He is alert and oriented to person, place, and time.  Psychiatric:        Mood and Affect: Mood normal.        Behavior: Behavior normal.    ED Results / Procedures / Treatments   Labs (all labs ordered are listed, but only abnormal results are displayed) Labs Reviewed  BASIC  METABOLIC PANEL - Abnormal; Notable for the following components:      Result Value   Glucose, Bld 115 (*)    Creatinine, Ser 1.37 (*)    GFR, Estimated 55 (*)    All other components within normal limits  CBC WITH DIFFERENTIAL/PLATELET - Abnormal; Notable for the following components:   WBC 16.7 (*)    Neutro Abs 13.7 (*)    Monocytes Absolute 1.4 (*)    All other components within normal limits  D-DIMER, QUANTITATIVE - Abnormal; Notable for the following components:   D-Dimer, Quant 1.83 (*)    All other components within normal limits  I-STAT VENOUS BLOOD GAS, ED - Abnormal; Notable for the following components:   pO2, Ven 19.0 (*)    Bicarbonate 32.9 (*)    TCO2 35 (*)    Acid-Base Excess 6.0 (*)    All other components within normal limits  TROPONIN I (HIGH SENSITIVITY) - Abnormal; Notable for the following components:   Troponin I (High Sensitivity) 20 (*)    All other components within normal limits  RESP PANEL BY RT-PCR (FLU A&B, COVID) ARPGX2  BRAIN NATRIURETIC PEPTIDE  LACTIC ACID, PLASMA  LACTIC ACID, PLASMA  BLOOD GAS, VENOUS  TROPONIN I (HIGH SENSITIVITY)    EKG None  Radiology DG Chest 2 View  Result Date: 08/25/2021 CLINICAL DATA:  Shortness of breath today, COPD, asthma EXAM: CHEST - 2 VIEW COMPARISON:  02/15/2021 FINDINGS: Normal heart size, mediastinal contours, and pulmonary vascularity. Atherosclerotic calcification aorta. Minimal chronic bronchitic changes. Lungs otherwise clear. No infiltrate, pleural effusion, or pneumothorax. Osseous structures unremarkable. IMPRESSION: Minimal chronic bronchitic changes without infiltrate. Aortic Atherosclerosis (ICD10-I70.0). Electronically Signed   By: Lavonia Dana M.D.   On: 08/25/2021 12:52    Procedures .Critical Care Performed by: Jeanell Sparrow, DO Authorized by: Jeanell Sparrow, DO   Critical care provider statement:    Critical care time (minutes):  36   Critical care time was exclusive of:  Separately  billable  procedures and treating other patients   Critical care was necessary to treat or prevent imminent or life-threatening deterioration of the following conditions:  Respiratory failure   Critical care was time spent personally by me on the following activities:  Ordering and performing treatments and interventions, ordering and review of laboratory studies, ordering and review of radiographic studies, pulse oximetry, re-evaluation of patient's condition, review of old charts, obtaining history from patient or surrogate, examination of patient, evaluation of patient's response to treatment and development of treatment plan with patient or surrogate   Medications Ordered in ED Medications  methylPREDNISolone sodium succinate (SOLU-MEDROL) 125 mg/2 mL injection 60 mg (60 mg Intravenous Given 08/25/21 1256)  ipratropium-albuterol (DUONEB) 0.5-2.5 (3) MG/3ML nebulizer solution 3 mL (3 mLs Nebulization Given 08/25/21 1256)  gabapentin (NEURONTIN) capsule 400 mg (400 mg Oral Given 08/25/21 1518)    ED Course  I have reviewed the triage vital signs and the nursing notes.  Pertinent labs & imaging results that were available during my care of the patient were reviewed by me and considered in my medical decision making (see chart for details).    MDM Rules/Calculators/A&P                           CC: dib  This patient complains of dib; this involves an extensive number of treatment options and is a complaint that carries with it a high risk of complications and morbidity. Vital signs were reviewed. Serious etiologies considered.  Patient with tachypnea, wheezing.  He is not hypoxic.  He does have some mild conversational dyspnea and accessory muscle use, belly breathing.  Hemodynamically stable.  Given history of COPD will give steroids, nebulized breathing treatment.  Continue very close monitoring of respiratory status.  Record review:  Previous records obtained and reviewed   Work up as  above, notable for:  Labs & imaging results that were available during my care of the patient were reviewed by me and considered in my medical decision making.   I ordered imaging studies which included CXR and I independently visualized and interpreted imaging which showed no acute process.  Management: Duonebs, steroids  Reassessment:  Breathing much better on 2L [place] and after nebs/steroids  Patient requiring supplemental oxygen 2L, no home oxygen.  Pt has NSTEMI, with trop leak at 20, likely demand ischemia from respiratory distress. Will given ASA. EKG without STEMI. He has leukocytosis on CBC, afebrile, HR stable. Cr is around his baseline.  Low risk well's score, dimer is elevated, will get CTPE.   At this time patient signed out to incoming physician, if he is able to ambulate without dyspnea or supplemental oxygen, negative CTPE it would be reasonable to discharge otherwise anticipate admission.            This chart was dictated using voice recognition software.  Despite best efforts to proofread,  errors can occur which can change the documentation meaning.  Final Clinical Impression(s) / ED Diagnoses Final diagnoses:  Hypoxia  COPD exacerbation ([place])  Acute respiratory failure with hypoxia ([place])  NSTEMI (non-ST elevated myocardial infarction) [facility])    Rx / DC Orders ED Discharge Orders          Ordered    aspirin 81 MG chewable tablet   Once        08/25/21 1554             Jeanell Sparrow, DO 08/25/21 1555

## 2021-08-25 NOTE — Discharge Instructions (Addendum)
You have been seen and discharged from the emergency department. You are being treated for COPD exacerbation and possible pneumonia. Take antibiotic and steroid as directed.  Follow-up with your primary provider for reevaluation and further care. Take home medications as prescribed. If you have any worsening symptoms or further concerns for your health please return to an emergency department for further evaluation.

## 2021-08-25 NOTE — ED Triage Notes (Signed)
Pt sent here from [facility], reports sob x 4 days. Spo2 88% on room air at ucc. Denies swelling anywhere. Having productive cough with gray sputum, denies fever.

## 2021-08-25 NOTE — ED Provider Notes (Addendum)
Evan Brock    CSN: [id number] Arrival date & time: 08/25/21  7673      History   Chief Complaint Chief Complaint  Patient presents with   Shortness of Breath    HPI Evan Brock is a 70 y.o. male.   Patient here for evaluation of shortness of breath that has been worsening over the past 4 days.  Reports using his nebulizer and rescue inhaler with minimal symptom relief.  Reports that he has since run out of his rescue inhaler.  Denies any congestion, fevers, or sore throat.  Denies any recent illnesses.  Denies any recent sick contacts.  Pulse ox 88 to 90% on room air in office.  Patient denies any home oxygen use.  Patient does have history of COPD as well as asthma.  Denies any trauma, injury, or other precipitating event.  Denies any specific alleviating or aggravating factors.  Denies any fevers, chest pain, N/V/D, numbness, tingling, weakness, abdominal pain, or headaches.    The history is provided by the patient.  Shortness of Breath Associated symptoms: cough   Associated symptoms: no chest pain    Past Medical History:  Diagnosis Date   Arthritis    "hips" (10/01/2013)   Asthma    COPD (chronic obstructive pulmonary disease) (HCC)    Exertional shortness of breath    Hypertension     Patient Active Problem List   Diagnosis Date Noted   CAP (community acquired pneumonia) 02/16/2021   Hypoxia 02/16/2021   COPD with acute exacerbation ([place]) 02/16/2021   Diabetes mellitus type 2 in obese ([place]) 02/16/2021   AKI (acute kidney injury) ([place]) 02/16/2021   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   Leukocytosis 10/04/2015   COPD (chronic obstructive pulmonary disease) ([place]) 10/01/2015   Essential hypertension, benign 03/01/2015   Asthma with acute exacerbation 01/26/2015   Thrombocytopenia, unspecified ([place]) 11/06/2013   Acute respiratory failure ([place]) with hypoxia 10/01/2013   Acute renal failure ([place]) 10/01/2013    Past Surgical History:  Procedure  Laterality Date   JOINT REPLACEMENT     TOTAL HIP ARTHROPLASTY Right 2010   TOTAL HIP ARTHROPLASTY Left 08/19/2013   Procedure: TOTAL HIP ARTHROPLASTY ANTERIOR APPROACH;  Surgeon: Hessie Dibble, MD;  Location: [facility];  Service: Orthopedics;  Laterality: Left;       Home Medications    Prior to Admission medications   Medication Sig Start Date End Date Taking? Authorizing Provider  albuterol (PROVENTIL) (2.5 MG/3ML) 0.083% nebulizer solution Take 2.5 mg by nebulization every 6 (six) hours as needed for wheezing or shortness of breath. 02/07/21   [provider]  albuterol (VENTOLIN HFA) 108 (90 Base) MCG/ACT inhaler INHALE 1 PUFF FOUR TIMES DAILY, AS NEEDED 03/12/20 03/12/21  Fredrich Romans, PA  amLODipine (NORVASC) 10 MG tablet Take 1 tablet (10 mg total) by mouth daily. 11/05/19   Kerin Perna, NP  atenolol (TENORMIN) 50 MG tablet Take 1 tablet (50 mg total) by mouth daily. 11/05/19   Kerin Perna, NP  atorvastatin (LIPITOR) 40 MG tablet Take 1 tablet (40 mg total) by mouth daily. 11/05/19   Kerin Perna, NP  Blood Pressure Monitor KIT 1 kit by Does not apply route 3 (three) times daily as needed. 11/05/19   Kerin Perna, NP  Budeson-Glycopyrrol-Formoterol (BREZTRI AEROSPHERE) 160-9-4.8 MCG/ACT AERO Inhale 2 puffs into the lungs in the morning and at bedtime. 05/19/20   Collene Gobble, MD  chlorthalidone (HYGROTON) 25 MG tablet Take 25 mg by  mouth daily. 12/09/20   [provider]  cloNIDine (CATAPRES) 0.1 MG tablet Take 1 tablet (0.1 mg total) by mouth daily. 11/05/19   Kerin Perna, NP  gabapentin (NEURONTIN) 800 MG tablet Take 800 mg by mouth 3 (three) times daily. 01/27/21   [provider]  HYDROcodone-acetaminophen (NORCO) 10-325 MG tablet Take 1 tablet by mouth every 6 (six) hours as needed for moderate pain or severe pain. 02/14/21   [provider]  losartan (COZAAR) 50 MG tablet Take 1 tablet (50 mg total) by mouth daily.  11/05/19   Kerin Perna, NP  metFORMIN (GLUCOPHAGE) 500 MG tablet Take 1 tablet (500 mg total) by mouth 2 (two) times daily with a meal. 11/05/19   Kerin Perna, NP  methocarbamol (ROBAXIN) 500 MG tablet Take 1 tablet (500 mg total) by mouth 2 (two) times daily as needed for muscle spasms. 05/25/21   Rayna Sexton, PA-C    Family History Family History  Problem Relation Age of Onset   Diabetes Mother    Hypertension Father    Lupus Sister     Social History Social History   Tobacco Use   Smoking status: Former    Packs/day: 0.00    Years: 0.00    Pack years: 0.00    Types: Cigarettes    Quit date: 10/23/1990    Years since quitting: 30.8   Smokeless tobacco: Never   Tobacco comments:    10/01/2013 "quit smoking cigarettes~ 15-20 yr ago"  Substance Use Topics   Alcohol use: Yes    Alcohol/week: 12.0 standard drinks    Types: 12 Cans of beer per week    Comment: 08/19/2013 "3 times/wk; 3-4 beers/night"   Drug use: Yes    Types: Marijuana    Comment: 10/01/2013 "last drug use was 10 yr ago"     Allergies   Patient has no known allergies.   Review of Systems Review of Systems  Respiratory:  Positive for cough and shortness of breath.   Cardiovascular:  Negative for chest pain.  All other systems reviewed and are negative.   Physical Exam Triage Vital Signs ED Triage Vitals  Enc Vitals Group     BP 08/25/21 1116 120/79     Pulse Rate 08/25/21 1116 92     Resp 08/25/21 1116 19     Temp 08/25/21 1116 98.3 F (36.8 C)     Temp Source 08/25/21 1116 Oral     SpO2 08/25/21 1116 (!) 88 %     Weight --      Height --      Head Circumference --      Peak Flow --      Pain Score 08/25/21 1117 5     Pain Loc --      Pain Edu? --      Excl. in [place]? --    No data found.  Updated Vital Signs BP 120/79 (BP Location: Right Arm)   Pulse 92   Temp 98.3 F (36.8 C) (Oral)   Resp 19   SpO2 (!) 88%   Visual Acuity Right Eye Distance:   Left Eye  Distance:   Bilateral Distance:    Right Eye Near:   Left Eye Near:    Bilateral Near:     Physical Exam Vitals and nursing note reviewed.  Constitutional:      General: He is in acute distress.     Appearance: Normal appearance. He is not ill-appearing, toxic-appearing or diaphoretic.  HENT:     Head: Normocephalic and atraumatic.  Eyes:     Conjunctiva/sclera: Conjunctivae normal.  Cardiovascular:     Rate and Rhythm: Normal rate.     Pulses: Normal pulses.  Pulmonary:     Effort: Tachypnea present.     Breath sounds: Examination of the right-upper field reveals decreased breath sounds. Examination of the left-upper field reveals decreased breath sounds. Examination of the right-middle field reveals decreased breath sounds. Examination of the left-middle field reveals decreased breath sounds. Examination of the right-lower field reveals decreased breath sounds. Examination of the left-lower field reveals decreased breath sounds. Decreased breath sounds present.  Abdominal:     General: Abdomen is flat.  Musculoskeletal:        General: Normal range of motion.     Cervical back: Normal range of motion.  Skin:    General: Skin is warm and dry.  Neurological:     General: No focal deficit present.     Mental Status: He is alert and oriented to person, place, and time.  Psychiatric:        Mood and Affect: Mood normal.     UC Treatments / Results  Labs (all labs ordered are listed, but only abnormal results are displayed) Labs Reviewed - No data to display  EKG   Radiology No results found.  Procedures Procedures (including critical care time)  Medications Ordered in UC Medications - No data to display  Initial Impression / Assessment and Plan / UC Course  I have reviewed the triage vital signs and the nursing notes.  Pertinent labs & imaging results that were available during my care of the patient were reviewed by me and considered in my medical decision  making (see chart for details).    Patient speaking in short sentences and with decreased oxygen saturation in office.  Therefore it is recommended that he go to the emergency room for further evaluation and a higher level of care.  Patient declined EMS transport and reports that he will transport himself with his ride. Final Clinical Impressions(s) / UC Diagnoses   Final diagnoses:  SOB (shortness of breath)   Discharge Instructions   None    ED Prescriptions   None    PDMP not reviewed this encounter.   Pearson Forster, NP 08/25/21 [place], [place], NP 08/25/21 1242

## 2021-08-25 NOTE — ED Triage Notes (Signed)
Pt presents with SOB. States it started 4 days ago and states it worsens every day. States he ran out of his inhaler.

## 2021-08-25 NOTE — ED Provider Notes (Signed)
Patient signed out to me by previous provider. Please refer to their note for full HPI.  Briefly this is a 70 year old male who presented for concern of hypoxia on room air with cough.  Placed on [place] on arrival. Blood work showed initially a slightly elevated troponin that down trended, no active chest pain.  No ischemic changes on EKG.  Chest x-ray shows chronic lung findings.  D-dimer was elevated so we are pending CT PE study to rule out other acute findings as well as reevaluation of hypoxia. Physical Exam  BP 136/82   Pulse 80   Temp 97.6 F (36.4 C) (Oral)   Resp 20   SpO2 90%   Physical Exam Vitals and nursing note reviewed.  Constitutional:      General: He is not in acute distress.    Appearance: Normal appearance. He is not diaphoretic.  HENT:     Head: Normocephalic.     Mouth/Throat:     Mouth: Mucous membranes are moist.  Cardiovascular:     Rate and Rhythm: Normal rate.  Pulmonary:     Effort: Pulmonary effort is normal. No tachypnea, accessory muscle usage or respiratory distress.  Abdominal:     Palpations: Abdomen is soft.     Tenderness: There is no abdominal tenderness.  Musculoskeletal:     Right lower leg: No edema.     Left lower leg: No edema.  Skin:    General: Skin is warm.  Neurological:     Mental Status: He is alert and oriented to person, place, and time. Mental status is at baseline.  Psychiatric:        Mood and Affect: Mood normal.    ED Course/Procedures     Procedures  MDM   CT PE study is negative for pulmonary embolism but does show bilateral worsening lung findings concerning for atypical infection or possible Mycobacterium infection.  Patient has no active risk factors for Mycobacterium.  He understands the findings on CT.  He has been weaned from nasal cannula oxygen and maintains his oxygen saturation at rest.  He is ambulated to the bathroom with no shortness of breath, maintained saturation on room air.  Patient states that he feels  much better.  Patient has a bag of green sputum, will send for sputum culture.  Discussed with the patient with his history of hypoxia and the imaging findings that we could admit him for lung infection and previous hypoxia.  However the patient states that he feels back to normal, feels great and wants to go home.  We will place him on antibiotics/steroids for COPD exacerbation/possible pneumonia given the green sputum and imaging findings.  Sputum culture will be evaluated but again lower suspicion for Mycobacterium infection.  He will follow up with PCP for repeat imaging and close monitoring. Patient at this time appears safe and stable for discharge and will be treated as an outpatient.  Discharge plan and strict return to ED precautions discussed, patient verbalizes understanding and agreement.       Rozelle Logan, DO 08/25/21 2120

## 2021-08-25 NOTE — ED Notes (Signed)
Patient is being discharged from the Urgent Care and sent to the Emergency Department via POV (declined EMS transfer) . Per Chales Salmon NP, patient is in need of higher level of care due to decreased oxygen level and SOB. Patient is aware and verbalizes understanding of plan of care.  Vitals:   08/25/21 1116  BP: 120/79  Pulse: 92  Resp: 19  Temp: 98.3 F (36.8 C)  SpO2: (!) 88%

## 2021-10-26 ENCOUNTER — Other Ambulatory Visit: Payer: Self-pay

## 2021-10-26 ENCOUNTER — Ambulatory Visit (HOSPITAL_COMMUNITY)
Admission: EM | Admit: 2021-10-26 | Discharge: 2021-10-26 | Disposition: A | Discharge status: Home / Self Care | Payer: Medicare HMO | Attending: Physician Assistant | Admitting: Physician Assistant

## 2021-10-26 ENCOUNTER — Encounter (HOSPITAL_COMMUNITY): Payer: Self-pay

## 2021-10-26 DIAGNOSIS — J441 Chronic obstructive pulmonary disease with (acute) exacerbation: Secondary | ICD-10-CM | POA: Diagnosis not present

## 2021-10-26 DIAGNOSIS — R0602 Shortness of breath: Secondary | ICD-10-CM

## 2021-10-26 MED ORDER — ALBUTEROL SULFATE HFA 108 (90 BASE) MCG/ACT IN AERS: INHALATION_SPRAY | RESPIRATORY_TRACT | 0 refills | Status: DC

## 2021-10-26 MED ORDER — METHYLPREDNISOLONE SODIUM SUCC 125 MG IJ SOLR
125.0000 mg | Freq: Once | INTRAMUSCULAR | Status: AC
  Administered 2021-10-26: 125 mg via INTRAMUSCULAR

## 2021-10-26 MED ORDER — DOXYCYCLINE HYCLATE 100 MG PO CAPS: 100.0000 mg | ORAL_CAPSULE | Freq: Two times a day (BID) | ORAL | 0 refills | Status: DC

## 2021-10-26 MED ORDER — ALBUTEROL SULFATE (2.5 MG/3ML) 0.083% IN NEBU: 2.5000 mg | INHALATION_SOLUTION | Freq: Four times a day (QID) | RESPIRATORY_TRACT | 1 refills | Status: DC | PRN

## 2021-10-26 MED ORDER — METHYLPREDNISOLONE SODIUM SUCC 125 MG IJ SOLR
INTRAMUSCULAR | Status: AC
  Filled 2021-10-26: qty 2

## 2021-10-26 MED ORDER — ALBUTEROL SULFATE (2.5 MG/3ML) 0.083% IN NEBU
2.5000 mg | INHALATION_SOLUTION | Freq: Once | RESPIRATORY_TRACT | Status: AC
  Administered 2021-10-26: 2.5 mg via RESPIRATORY_TRACT

## 2021-10-26 MED ORDER — ALBUTEROL SULFATE (2.5 MG/3ML) 0.083% IN NEBU
INHALATION_SOLUTION | RESPIRATORY_TRACT | Status: AC
  Filled 2021-10-26: qty 3

## 2021-10-26 MED ORDER — PREDNISONE 10 MG (21) PO TBPK
ORAL_TABLET | ORAL | 0 refills | Status: DC
Start: 2021-10-26 — End: 2021-11-14

## 2021-10-26 NOTE — Discharge Instructions (Signed)
I am glad you are feeling better after nebulizer treatment.  I have sent in refills of your nebulizer medication as well as albuterol inhaler.  Please continue using this regularly for shortness of breath and coughing fits.  I have started you on prednisone that you should begin tomorrow (10/27/2021) since we gave you a steroid injection today.  Start doxycycline 100 mg twice daily for 10 days.  Follow-up with your PCP within a few days to ensure symptom improvement.  As we discussed, you need to monitor your oxygenation level at home.  If this is persistently below 93% you need to be seen again.  If it drops below 90% you need to go to the emergency room.  Please go to the emergency room if you have any worsening symptoms including increased shortness of breath, high fever not responding to medication, nausea/vomiting interfering with oral intake, chest pain.

## 2021-10-26 NOTE — ED Triage Notes (Addendum)
Pt c/o COPD exacerbation x2 weeks, using neb tx's daily. Pt c/o SOB and wheezing worse today. Pt has adubile wheezing during triage. SOB on exertion, speaking complete sentences at this time. States has not had a neb tx today.

## 2021-10-26 NOTE — ED Notes (Signed)
Neb tx started at 0944 by this CMA.

## 2021-10-26 NOTE — ED Provider Notes (Signed)
[facility]    CSN: [id number] Arrival date & time: 10/26/21  4010      History   Chief Complaint Chief Complaint  Patient presents with   Shortness of Breath    HPI Evan Brock is a 71 y.o. male.   Patient presents today with a 3 to 4-day history of worsening shortness of breath.  He has a history of asthma and COPD and reports he has been without his albuterol inhaler for 3 to 4 days.  He denies any additional symptoms besides shortness of breath; denies any chest pain, lightheadedness, near syncope, syncopal episodes, nausea, vomiting, fever, cough.  He has not been using any over-the-counter medication for symptom management.  He does not have oxygen at home but does have nebulizer but he is without the medication.  He reports a history of diabetes with last A1c 6.6% April 2022.  He denies hospitalization for asthma or COPD but has had numerous ER visits as recently as 6 weeks ago.  He denies any known sick contacts.  He is a former smoker who quit 30 years ago.   Past Medical History:  Diagnosis Date   Arthritis    "hips" (10/01/2013)   Asthma    COPD (chronic obstructive pulmonary disease) (HCC)    Exertional shortness of breath    Hypertension     Patient Active Problem List   Diagnosis Date Noted   CAP (community acquired pneumonia) 02/16/2021   Hypoxia 02/16/2021   COPD with acute exacerbation ([place]) 02/16/2021   Diabetes mellitus type 2 in obese ([place]) 02/16/2021   AKI (acute kidney injury) ([place]) 02/16/2021   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   Leukocytosis 10/04/2015   COPD (chronic obstructive pulmonary disease) ([place]) 10/01/2015   Essential hypertension, benign 03/01/2015   Asthma with acute exacerbation 01/26/2015   Thrombocytopenia, unspecified ([place]) 11/06/2013   Acute respiratory failure ([place]) with hypoxia 10/01/2013   Acute renal failure ([place]) 10/01/2013    Past Surgical History:  Procedure Laterality Date   JOINT REPLACEMENT      TOTAL HIP ARTHROPLASTY Right 2010   TOTAL HIP ARTHROPLASTY Left 08/19/2013   Procedure: TOTAL HIP ARTHROPLASTY ANTERIOR APPROACH;  Surgeon: Hessie Dibble, MD;  Location: [facility];  Service: Orthopedics;  Laterality: Left;       Home Medications    Prior to Admission medications   Medication Sig Start Date End Date Taking? Authorizing Provider  doxycycline (VIBRAMYCIN) 100 MG capsule Take 1 capsule (100 mg total) by mouth 2 (two) times daily. 10/26/21  Yes Aluel Schwarz K, PA-C  predniSONE (STERAPRED UNI-PAK 21 TAB) 10 MG (21) TBPK tablet As directed 10/26/21  Yes Sion Reinders K, PA-C  albuterol (PROVENTIL) (2.5 MG/3ML) 0.083% nebulizer solution Take 3 mLs (2.5 mg total) by nebulization every 6 (six) hours as needed for wheezing or shortness of breath. 10/26/21   Jennie Bolar, Derry Skill, PA-C  albuterol (VENTOLIN HFA) 108 (90 Base) MCG/ACT inhaler INHALE 1 PUFF FOUR TIMES DAILY, AS NEEDED 10/26/21 10/26/22  Joh Rao, Junie Panning K, PA-C  amLODipine (NORVASC) 10 MG tablet Take 1 tablet (10 mg total) by mouth daily. 11/05/19   Kerin Perna, NP  atenolol (TENORMIN) 50 MG tablet Take 1 tablet (50 mg total) by mouth daily. 11/05/19   Kerin Perna, NP  atorvastatin (LIPITOR) 40 MG tablet Take 1 tablet (40 mg total) by mouth daily. 11/05/19   Kerin Perna, NP  Blood Pressure Monitor KIT 1 kit by Does not apply route 3 (three) times  daily as needed. 11/05/19   Kerin Perna, NP  Budeson-Glycopyrrol-Formoterol (BREZTRI AEROSPHERE) 160-9-4.8 MCG/ACT AERO Inhale 2 puffs into the lungs in the morning and at bedtime. 05/19/20   Collene Gobble, MD  chlorthalidone (HYGROTON) 25 MG tablet Take 25 mg by mouth daily. 12/09/20   [provider]  cloNIDine (CATAPRES) 0.1 MG tablet Take 1 tablet (0.1 mg total) by mouth daily. 11/05/19   Kerin Perna, NP  gabapentin (NEURONTIN) 800 MG tablet Take 800 mg by mouth 3 (three) times daily. 01/27/21   [provider]  HYDROcodone-acetaminophen (NORCO)  10-325 MG tablet Take 1 tablet by mouth every 6 (six) hours as needed for moderate pain or severe pain. 02/14/21   [provider]  losartan (COZAAR) 50 MG tablet Take 1 tablet (50 mg total) by mouth daily. 11/05/19   Kerin Perna, NP  metFORMIN (GLUCOPHAGE) 500 MG tablet Take 1 tablet (500 mg total) by mouth 2 (two) times daily with a meal. 11/05/19   Kerin Perna, NP  methocarbamol (ROBAXIN) 500 MG tablet Take 1 tablet (500 mg total) by mouth 2 (two) times daily as needed for muscle spasms. 05/25/21   Rayna Sexton, PA-C    Family History Family History  Problem Relation Age of Onset   Diabetes Mother    Hypertension Father    Lupus Sister     Social History Social History   Tobacco Use   Smoking status: Former    Packs/day: 0.00    Years: 0.00    Pack years: 0.00    Types: Cigarettes    Quit date: 10/23/1990    Years since quitting: 31.0   Smokeless tobacco: Never   Tobacco comments:    10/01/2013 "quit smoking cigarettes~ 15-20 yr ago"  Substance Use Topics   Alcohol use: Yes    Alcohol/week: 12.0 standard drinks    Types: 12 Cans of beer per week    Comment: 08/19/2013 "3 times/wk; 3-4 beers/night"   Drug use: Yes    Types: Marijuana    Comment: 10/01/2013 "last drug use was 10 yr ago"     Allergies   Patient has no known allergies.   Review of Systems Review of Systems  Constitutional:  Positive for activity change and fatigue. Negative for appetite change and fever.  HENT:  Negative for congestion, sinus pressure, sneezing and sore throat.   Respiratory:  Positive for chest tightness, shortness of breath and wheezing. Negative for cough.   Cardiovascular:  Negative for chest pain.  Gastrointestinal:  Negative for abdominal pain, diarrhea, nausea and vomiting.  Neurological:  Negative for dizziness, syncope, weakness, light-headedness and headaches.    Physical Exam Triage Vital Signs ED Triage Vitals [10/26/21 0923]  Enc Vitals Group      BP 127/89     Pulse Rate (!) 105     Resp 20     Temp 98.1 F (36.7 C)     Temp Source Oral     SpO2 93 %     Weight      Height      Head Circumference      Peak Flow      Pain Score 0     Pain Loc      Pain Edu?      Excl. in [place]?    No data found.  Updated Vital Signs BP 127/89 (BP Location: Right Arm)    Pulse (!) 105    Temp 98.1 F (36.7 C) (Oral)  Resp 20    SpO2 93%   Visual Acuity Right Eye Distance:   Left Eye Distance:   Bilateral Distance:    Right Eye Near:   Left Eye Near:    Bilateral Near:     Physical Exam Vitals reviewed.  Constitutional:      General: He is awake.     Appearance: Normal appearance. He is well-developed. He is not ill-appearing.     Comments: Very pleasant male appears stated age in no acute distress sitting comfortably in exam room  HENT:     Head: Normocephalic and atraumatic.     Mouth/Throat:     Mouth: Mucous membranes are moist.     Pharynx: Uvula midline. No oropharyngeal exudate, posterior oropharyngeal erythema or uvula swelling.  Cardiovascular:     Rate and Rhythm: Normal rate and regular rhythm.     Heart sounds: Normal heart sounds, S1 normal and S2 normal. No murmur heard. Pulmonary:     Effort: Pulmonary effort is normal.     Breath sounds: No stridor. Wheezing and rhonchi present. No rales.     Comments: Widespread wheezing and rhonchi Skin:    General: Skin is warm.     Coloration: Skin is not cyanotic or pale.  Neurological:     Mental Status: He is alert.  Psychiatric:        Behavior: Behavior is cooperative.     UC Treatments / Results  Labs (all labs ordered are listed, but only abnormal results are displayed) Labs Reviewed - No data to display  EKG   Radiology No results found.  Procedures Procedures (including critical care time)  Medications Ordered in UC Medications  albuterol (PROVENTIL) (2.5 MG/3ML) 0.083% nebulizer solution 2.5 mg (2.5 mg Nebulization Given 10/26/21 0939)   methylPREDNISolone sodium succinate (SOLU-MEDROL) 125 mg/2 mL injection 125 mg (125 mg Intramuscular Given 10/26/21 0939)    Initial Impression / Assessment and Plan / UC Course  I have reviewed the triage vital signs and the nursing notes.  Pertinent labs & imaging results that were available during my care of the patient were reviewed by me and considered in my medical decision making (see chart for details).     Patient was given 125 mg of Solu-Medrol and nebulizer breathing treatment with significant improvement of symptoms.  His pulse ox fluctuated between 90%-94% following treatment.  Patient does not have oxygen at home but reports that he is feeling much better.  Refills of nebulizer and albuterol inhaler were sent to pharmacy with instruction to use this on a scheduled basis for the next several days.  He was started on prednisone taper beginning tomorrow (10/27/2021) and instructed not to take NSAIDs with this medication due to risk of GI bleeding.  Viral testing was deferred as patient had no URI symptoms outside of shortness of breath.  Given concern for COPD exacerbation will cover with doxycycline he was instructed to avoid sun exposure while on this medication.  He can use Mucinex and Flonase at home for symptom relief.  He is to rest and drink plenty of fluid.  Recommended follow-up with either our clinic or primary care within a few days to ensure symptom improvement.  Patient has the ability to monitor his pulse ox at home and we discussed that if this is below 93% he needs to be seen again and if below 90% he needs to go to the hospital.  Discussed alarm symptoms that warrant emergent evaluation including worsening shortness of breath, increased  dependence on albuterol, severe cough, chest pain, lightheadedness, weakness.  Strict return precautions given to which she expressed understanding.  Final Clinical Impressions(s) / UC Diagnoses   Final diagnoses:  COPD exacerbation ([place])   Shortness of breath     Discharge Instructions      I am glad you are feeling better after nebulizer treatment.  I have sent in refills of your nebulizer medication as well as albuterol inhaler.  Please continue using this regularly for shortness of breath and coughing fits.  I have started you on prednisone that you should begin tomorrow (10/27/2021) since we gave you a steroid injection today.  Start doxycycline 100 mg twice daily for 10 days.  Follow-up with your PCP within a few days to ensure symptom improvement.  As we discussed, you need to monitor your oxygenation level at home.  If this is persistently below 93% you need to be seen again.  If it drops below 90% you need to go to the emergency room.  Please go to the emergency room if you have any worsening symptoms including increased shortness of breath, high fever not responding to medication, nausea/vomiting interfering with oral intake, chest pain.     ED Prescriptions     Medication Sig Dispense Auth. Provider   predniSONE (STERAPRED UNI-PAK 21 TAB) 10 MG (21) TBPK tablet As directed 21 tablet Lua Feng K, PA-C   doxycycline (VIBRAMYCIN) 100 MG capsule Take 1 capsule (100 mg total) by mouth 2 (two) times daily. 20 capsule Thomas Rhude K, PA-C   albuterol (PROVENTIL) (2.5 MG/3ML) 0.083% nebulizer solution Take 3 mLs (2.5 mg total) by nebulization every 6 (six) hours as needed for wheezing or shortness of breath. 75 mL Niara Bunker K, PA-C   albuterol (VENTOLIN HFA) 108 (90 Base) MCG/ACT inhaler INHALE 1 PUFF FOUR TIMES DAILY, AS NEEDED 8 g Saamiya Jeppsen K, PA-C      PDMP not reviewed this encounter.   Terrilee Croak, PA-C 10/26/21 1024

## 2021-11-12 ENCOUNTER — Emergency Department (HOSPITAL_COMMUNITY): Payer: Medicare HMO

## 2021-11-12 ENCOUNTER — Other Ambulatory Visit: Payer: Self-pay

## 2021-11-12 ENCOUNTER — Encounter (HOSPITAL_COMMUNITY): Payer: Self-pay | Admitting: Family Medicine

## 2021-11-12 ENCOUNTER — Inpatient Hospital Stay (HOSPITAL_COMMUNITY)
Admission: EM | Admit: 2021-11-12 | Discharge: 2021-11-14 | DRG: 189 | Disposition: A | Discharge status: Home / Self Care | Payer: Medicare HMO | Attending: Family Medicine | Admitting: Family Medicine

## 2021-11-12 DIAGNOSIS — Z833 Family history of diabetes mellitus: Secondary | ICD-10-CM

## 2021-11-12 DIAGNOSIS — Z87891 Personal history of nicotine dependence: Secondary | ICD-10-CM

## 2021-11-12 DIAGNOSIS — R0902 Hypoxemia: Secondary | ICD-10-CM

## 2021-11-12 DIAGNOSIS — E785 Hyperlipidemia, unspecified: Secondary | ICD-10-CM | POA: Diagnosis present

## 2021-11-12 DIAGNOSIS — J441 Chronic obstructive pulmonary disease with (acute) exacerbation: Secondary | ICD-10-CM | POA: Diagnosis not present

## 2021-11-12 DIAGNOSIS — J189 Pneumonia, unspecified organism: Secondary | ICD-10-CM | POA: Diagnosis present

## 2021-11-12 DIAGNOSIS — Z20822 Contact with and (suspected) exposure to covid-19: Secondary | ICD-10-CM | POA: Diagnosis present

## 2021-11-12 DIAGNOSIS — E119 Type 2 diabetes mellitus without complications: Secondary | ICD-10-CM | POA: Diagnosis present

## 2021-11-12 DIAGNOSIS — J9811 Atelectasis: Secondary | ICD-10-CM | POA: Diagnosis present

## 2021-11-12 DIAGNOSIS — E1169 Type 2 diabetes mellitus with other specified complication: Secondary | ICD-10-CM | POA: Diagnosis present

## 2021-11-12 DIAGNOSIS — G8929 Other chronic pain: Secondary | ICD-10-CM | POA: Diagnosis present

## 2021-11-12 DIAGNOSIS — J9601 Acute respiratory failure with hypoxia: Principal | ICD-10-CM | POA: Diagnosis present

## 2021-11-12 DIAGNOSIS — E669 Obesity, unspecified: Secondary | ICD-10-CM

## 2021-11-12 DIAGNOSIS — I10 Essential (primary) hypertension: Secondary | ICD-10-CM | POA: Diagnosis present

## 2021-11-12 DIAGNOSIS — J440 Chronic obstructive pulmonary disease with acute lower respiratory infection: Secondary | ICD-10-CM | POA: Diagnosis present

## 2021-11-12 DIAGNOSIS — Z8249 Family history of ischemic heart disease and other diseases of the circulatory system: Secondary | ICD-10-CM

## 2021-11-12 DIAGNOSIS — Z7951 Long term (current) use of inhaled steroids: Secondary | ICD-10-CM

## 2021-11-12 DIAGNOSIS — Z7984 Long term (current) use of oral hypoglycemic drugs: Secondary | ICD-10-CM

## 2021-11-12 DIAGNOSIS — Z96643 Presence of artificial hip joint, bilateral: Secondary | ICD-10-CM | POA: Diagnosis present

## 2021-11-12 DIAGNOSIS — Z79899 Other long term (current) drug therapy: Secondary | ICD-10-CM

## 2021-11-12 LAB — CBC WITH DIFFERENTIAL/PLATELET
Abs Immature Granulocytes: 0.04 10*3/uL (ref 0.00–0.07)
Basophils Absolute: 0 10*3/uL (ref 0.0–0.1)
Basophils Relative: 0 %
Eosinophils Absolute: 0.1 10*3/uL (ref 0.0–0.5)
Eosinophils Relative: 1 %
HCT: 40.7 % (ref 39.0–52.0)
Hemoglobin: 12.5 g/dL — ABNORMAL LOW (ref 13.0–17.0)
Immature Granulocytes: 0 %
Lymphocytes Relative: 10 %
Lymphs Abs: 1.1 10*3/uL (ref 0.7–4.0)
MCH: 26.2 pg (ref 26.0–34.0)
MCHC: 30.7 g/dL (ref 30.0–36.0)
MCV: 85.3 fL (ref 80.0–100.0)
Monocytes Absolute: 1 10*3/uL (ref 0.1–1.0)
Monocytes Relative: 8 %
Neutro Abs: 9.6 10*3/uL — ABNORMAL HIGH (ref 1.7–7.7)
Neutrophils Relative %: 81 %
Platelets: 175 10*3/uL (ref 150–400)
RBC: 4.77 MIL/uL (ref 4.22–5.81)
RDW: 16.5 % — ABNORMAL HIGH (ref 11.5–15.5)
WBC: 11.9 10*3/uL — ABNORMAL HIGH (ref 4.0–10.5)
nRBC: 0 % (ref 0.0–0.2)

## 2021-11-12 LAB — RESP PANEL BY RT-PCR (FLU A&B, COVID) ARPGX2
Influenza A by PCR: NEGATIVE
Influenza B by PCR: NEGATIVE
SARS Coronavirus 2 by RT PCR: NEGATIVE

## 2021-11-12 LAB — GLUCOSE, CAPILLARY: Glucose-Capillary: 215 mg/dL — ABNORMAL HIGH (ref 70–99)

## 2021-11-12 LAB — BASIC METABOLIC PANEL
Anion gap: 9 (ref 5–15)
BUN: 16 mg/dL (ref 8–23)
CO2: 30 mmol/L (ref 22–32)
Calcium: 9 mg/dL (ref 8.9–10.3)
Chloride: 97 mmol/L — ABNORMAL LOW (ref 98–111)
Creatinine, Ser: 0.92 mg/dL (ref 0.61–1.24)
GFR, Estimated: >60 mL/min (ref >60)
Glucose, Bld: 134 mg/dL — ABNORMAL HIGH (ref 70–99)
Potassium: 4.3 mmol/L (ref 3.5–5.1)
Sodium: 136 mmol/L (ref 135–145)

## 2021-11-12 LAB — HIV ANTIBODY (ROUTINE TESTING W REFLEX): HIV Screen 4th Generation wRfx: NONREACTIVE

## 2021-11-12 LAB — PROCALCITONIN: Procalcitonin: <0.1 ng/mL

## 2021-11-12 MED ORDER — HYDROCODONE-ACETAMINOPHEN 10-325 MG PO TABS
1.0000 | ORAL_TABLET | Freq: Four times a day (QID) | ORAL | Status: DC | PRN
  Administered 2021-11-12 – 2021-11-14 (×5): 1 via ORAL
  Filled 2021-11-12 (×5): qty 1

## 2021-11-12 MED ORDER — PREDNISONE 20 MG PO TABS
40.0000 mg | ORAL_TABLET | Freq: Every day | ORAL | Status: DC
  Administered 2021-11-14: 40 mg via ORAL
  Filled 2021-11-12: qty 2

## 2021-11-12 MED ORDER — ONDANSETRON HCL 4 MG PO TABS: 4.0000 mg | ORAL_TABLET | Freq: Four times a day (QID) | ORAL | Status: DC | PRN

## 2021-11-12 MED ORDER — SODIUM CHLORIDE 0.9 % IV SOLN
1.0000 g | Freq: Once | INTRAVENOUS | Status: AC
  Administered 2021-11-12: 1 g via INTRAVENOUS
  Filled 2021-11-12: qty 10

## 2021-11-12 MED ORDER — INSULIN ASPART 100 UNIT/ML IJ SOLN
0.0000 [IU] | Freq: Three times a day (TID) | INTRAMUSCULAR | Status: DC
  Administered 2021-11-13: 1 [IU] via SUBCUTANEOUS
  Administered 2021-11-13: 2 [IU] via SUBCUTANEOUS
  Administered 2021-11-13: 1 [IU] via SUBCUTANEOUS
  Administered 2021-11-14: 2 [IU] via SUBCUTANEOUS

## 2021-11-12 MED ORDER — SODIUM CHLORIDE 0.9 % IV SOLN
500.0000 mg | Freq: Once | INTRAVENOUS | Status: AC
  Administered 2021-11-12: 500 mg via INTRAVENOUS
  Filled 2021-11-12: qty 5

## 2021-11-12 MED ORDER — ENOXAPARIN SODIUM 40 MG/0.4ML IJ SOSY
40.0000 mg | PREFILLED_SYRINGE | INTRAMUSCULAR | Status: DC
  Administered 2021-11-12 – 2021-11-13 (×2): 40 mg via SUBCUTANEOUS
  Filled 2021-11-12 (×2): qty 0.4

## 2021-11-12 MED ORDER — AZITHROMYCIN 250 MG PO TABS
250.0000 mg | ORAL_TABLET | Freq: Every day | ORAL | Status: DC
  Administered 2021-11-13 – 2021-11-14 (×2): 250 mg via ORAL
  Filled 2021-11-12 (×3): qty 1

## 2021-11-12 MED ORDER — SODIUM CHLORIDE 0.9 % IV SOLN
1.0000 g | INTRAVENOUS | Status: DC
  Administered 2021-11-13: 1 g via INTRAVENOUS
  Filled 2021-11-12: qty 10

## 2021-11-12 MED ORDER — ONDANSETRON HCL 4 MG/2ML IJ SOLN: 4.0000 mg | Freq: Four times a day (QID) | INTRAMUSCULAR | Status: DC | PRN

## 2021-11-12 MED ORDER — HYDRALAZINE HCL 25 MG PO TABS
25.0000 mg | ORAL_TABLET | Freq: Four times a day (QID) | ORAL | Status: DC | PRN
Start: 2021-11-12 — End: 2021-11-14

## 2021-11-12 MED ORDER — IPRATROPIUM-ALBUTEROL 0.5-2.5 (3) MG/3ML IN SOLN
3.0000 mL | Freq: Four times a day (QID) | RESPIRATORY_TRACT | Status: DC
  Administered 2021-11-12 – 2021-11-13 (×3): 3 mL via RESPIRATORY_TRACT
  Filled 2021-11-12 (×3): qty 3

## 2021-11-12 MED ORDER — IOHEXOL 300 MG/ML  SOLN
100.0000 mL | Freq: Once | INTRAMUSCULAR | Status: AC | PRN
  Administered 2021-11-12: 100 mL via INTRAVENOUS

## 2021-11-12 MED ORDER — LOSARTAN POTASSIUM 50 MG PO TABS
50.0000 mg | ORAL_TABLET | Freq: Every day | ORAL | Status: DC
  Administered 2021-11-13 – 2021-11-14 (×2): 50 mg via ORAL
  Filled 2021-11-12 (×2): qty 1

## 2021-11-12 MED ORDER — ALBUTEROL SULFATE (2.5 MG/3ML) 0.083% IN NEBU: 2.5000 mg | INHALATION_SOLUTION | RESPIRATORY_TRACT | Status: DC | PRN

## 2021-11-12 MED ORDER — METHYLPREDNISOLONE SODIUM SUCC 40 MG IJ SOLR
40.0000 mg | Freq: Two times a day (BID) | INTRAMUSCULAR | Status: AC
  Administered 2021-11-12 – 2021-11-13 (×3): 40 mg via INTRAVENOUS
  Filled 2021-11-12 (×3): qty 1

## 2021-11-12 MED ORDER — ACETAMINOPHEN 325 MG PO TABS: 650.0000 mg | ORAL_TABLET | Freq: Four times a day (QID) | ORAL | Status: DC | PRN

## 2021-11-12 MED ORDER — ACETAMINOPHEN 650 MG RE SUPP: 650.0000 mg | Freq: Four times a day (QID) | RECTAL | Status: DC | PRN

## 2021-11-12 MED ORDER — INSULIN ASPART 100 UNIT/ML IJ SOLN
0.0000 [IU] | Freq: Every day | INTRAMUSCULAR | Status: DC
  Administered 2021-11-12: 2 [IU] via SUBCUTANEOUS

## 2021-11-12 NOTE — ED Triage Notes (Signed)
Pt here via EMS from home with c/o increased SOB and work of breathing X4 days. History of COPD. Doing home neb treatments with no relief. Upon EMS Arrival pt SpO2 86% RA, diminished lung sounds, with expiratory wheezing. Difficult for pt to speak complete sentences. EMS gave Duo Neb and 125mg  Solumedrol. Some relief .   116/75 SpO2 100% HR 110

## 2021-11-12 NOTE — Plan of Care (Signed)
Pt arrived to unit approx 1935. Pt alert and oriented x 4. Ad lib. On 2L [place]. Vitals stable.

## 2021-11-12 NOTE — H&P (Signed)
History and Physical   Fed Evan Brock:[id number] DOB: November 18, 1950 DOA: 11/12/2021  Referring MD/NP/PA: Dr. Rogene Houston, EDP PCP: Sherald Hess., MD  Patient coming from: Home  Chief Complaint: Shortness of breath, wheezing  HPI: Evan Brock is a 71 y.o. male with a history of COPD, HTN, T2DM, HLD who presented to the ED by EMS today due to shortness of breath that worsened over the past 4 days. He noticed shortness of breath provoked by exertion only, progressing to include at rest associated with intermittent wheezing. He has a very mild chronic cough that has no changed. He denies chest pain, hemoptysis, purulent sputum, fever, sick contacts. Albuterol nebs at home improved symptoms, but since the wheezing and dyspnea kept getting worse, he called EMS who administered nebs and gave solumedrol 189m with improvement in symptoms.  ED Course: Afebrile, hypoxemic to 86% improved to 93% on 3L O2, subsequently down to 2L with improvement in tachypnea with continued nebs. CXR showed bibasilar infiltrates confirmed on subsequent CT chest to represent patchy bibasilar and RML infiltrates and bronchiectasis. Antibiotics were ordered and admission requested.   Review of Systems: Has neck pain for which he's taken hydrocodone 4 times per day for a couple months, no leg swelling, PND, orthopnea, personal or family history of blood clots, and per HPI. All others reviewed and are negative.   Past Medical History:  Diagnosis Date   Arthritis    "hips" (10/01/2013)   Asthma    COPD (chronic obstructive pulmonary disease) (HCC)    Exertional shortness of breath    Hypertension    Past Surgical History:  Procedure Laterality Date   JOINT REPLACEMENT     TOTAL HIP ARTHROPLASTY Right 2010   TOTAL HIP ARTHROPLASTY Left 08/19/2013   Procedure: TOTAL HIP ARTHROPLASTY ANTERIOR APPROACH;  Surgeon: PHessie Dibble MD;  [place] [facility]  Service: Orthopedics;  Laterality: Left;   - Remote smoker (30  years ago), [place], smoked marijuana until his COPD got worse, drinks 24oz beer daily, never withdraws. - NKDA  Family History  Problem Relation Age of Onset   Diabetes Mother    Hypertension Father    Lupus Sister    - Family history otherwise reviewed and not pertinent.  Prior to Admission medications   Medication Sig Start Date End Date Taking? Authorizing Provider  albuterol (PROVENTIL) (2.5 MG/3ML) 0.083% nebulizer solution Take 3 mLs (2.5 mg total) by nebulization every 6 (six) hours as needed for wheezing or shortness of breath. 10/26/21   Raspet, EDerry Skill PA-C  albuterol (VENTOLIN HFA) 108 (90 Base) MCG/ACT inhaler INHALE 1 PUFF FOUR TIMES DAILY, AS NEEDED 10/26/21 10/26/22  Raspet, EJunie PanningK, PA-C  amLODipine (NORVASC) 10 MG tablet Take 1 tablet (10 mg total) by mouth daily. 11/05/19   EKerin Perna NP  atenolol (TENORMIN) 50 MG tablet Take 1 tablet (50 mg total) by mouth daily. 11/05/19   EKerin Perna NP  atorvastatin (LIPITOR) 40 MG tablet Take 1 tablet (40 mg total) by mouth daily. 11/05/19   EKerin Perna NP  Blood Pressure Monitor KIT 1 kit by Does not apply route 3 (three) times daily as needed. 11/05/19   EKerin Perna NP  Budeson-Glycopyrrol-Formoterol (BREZTRI AEROSPHERE) 160-9-4.8 MCG/ACT AERO Inhale 2 puffs into the lungs in the morning and at bedtime. 05/19/20   BCollene Gobble MD  chlorthalidone (HYGROTON) 25 MG tablet Take 25 mg by mouth daily. 12/09/20   [provider]  cloNIDine (CATAPRES) 0.1 MG tablet Take 1 tablet (0.1 mg total) by mouth daily. 11/05/19   Kerin Perna, NP  doxycycline (VIBRAMYCIN) 100 MG capsule Take 1 capsule (100 mg total) by mouth 2 (two) times daily. 10/26/21   Raspet, Derry Skill, PA-C  gabapentin (NEURONTIN) 800 MG tablet Take 800 mg by mouth 3 (three) times daily. 01/27/21   [provider]  HYDROcodone-acetaminophen (NORCO) 10-325 MG tablet Take 1 tablet by mouth every 6 (six) hours  as needed for moderate pain or severe pain. 02/14/21   [provider]  losartan (COZAAR) 50 MG tablet Take 1 tablet (50 mg total) by mouth daily. 11/05/19   Kerin Perna, NP  metFORMIN (GLUCOPHAGE) 500 MG tablet Take 1 tablet (500 mg total) by mouth 2 (two) times daily with a meal. 11/05/19   Kerin Perna, NP  methocarbamol (ROBAXIN) 500 MG tablet Take 1 tablet (500 mg total) by mouth 2 (two) times daily as needed for muscle spasms. 05/25/21   Rayna Sexton, PA-C  predniSONE (STERAPRED UNI-PAK 21 TAB) 10 MG (21) TBPK tablet As directed 10/26/21   Terrilee Croak, PA-C   Physical Exam: Vitals:   11/12/21 1415 11/12/21 1445 11/12/21 1520 11/12/21 1545  BP: (!) 144/79  (!) 147/83 (!) 154/99  Pulse: 86 83 85 86  Resp: (!) 22 19 20  (!) 23  Temp:      TempSrc:      SpO2:  95% 100% 97%  Weight:      Height:       Constitutional: Nontoxic 70yo male in no distress, calm demeanor Eyes: Lids and conjunctivae normal, PERRL ENMT: Mucous membranes are moist. Posterior pharynx clear of any exudate or lesions. Fair dentition.  Neck: normal, supple, no masses, no thyromegaly Respiratory: Non-labored, rate 20-25 without accessory muscle use. Prolonged expiration, good air movement with end-expiratory wheezing.    Cardiovascular: Regular rate and rhythm, no murmurs, rubs, or gallops. No carotid bruits. No JVD. No pitting LE edema. Palpable pedal pulses. Abdomen: Normoactive bowel sounds. No tenderness, non-distended, and no masses palpated. No hepatosplenomegaly. GU: No indwelling catheter Musculoskeletal: No clubbing / cyanosis. No joint deformity upper and lower extremities. Good ROM, no contractures. Normal muscle tone.  Skin: Warm, dry. No rashes, wounds, or ulcers on visualized skin. Neurologic: CN II-XII grossly intact. Speech normal. No focal deficits in motor strength or sensation in all extremities.  Psychiatric: Alert and oriented x3. Normal judgment and insight. Mood euthymic  with congruent affect.   Labs on Admission: I have personally reviewed following labs and imaging studies  CBC: Recent Labs  Lab 11/12/21 1012  WBC 11.9*  NEUTROABS 9.6*  HGB 12.5*  HCT 40.7  MCV 85.3  PLT 357   Basic Metabolic Panel: Recent Labs  Lab 11/12/21 1012  NA 136  K 4.3  CL 97*  CO2 30  GLUCOSE 134*  BUN 16  CREATININE 0.92  CALCIUM 9.0   Covid, flu: Negative PCR CXR: bilateral LL opacities (atelectasis vs. infiltrate) CT chest w/contrast: Patchy bilateral LOWER lobe and RIGHT middle lobe airspace opacities likely representing infection/pneumonia. 2. Cylindrical bronchiectasis within bilateral LOWER lobes and the RIGHT middle lobe. 3. Coronary artery disease. 4. Aortic Atherosclerosis EKG: Independently reviewed. NSR with submillimeter diffuse ST elevations that are unchanged from ECG Nov 2022.  Assessment/Plan Principal Problem:   Acute respiratory failure with hypoxia (HCC)   Acute hypoxic respiratory failure due to AECOPD due to bilateral pneumonia: Pt has leukocytosis though has been on steroids. Afebrile.  CT chest (my personal review) shows patchy airspace disease more in fitting with infectious process than atelectasis as well as bronchiectasis.  - Will continue ceftriaxone, azithromycin - Continue steroids, IV solumedrol while still a bit tight, convert to prednisone soon. - Check PCT - Will need follow up with Pulmonary ([facility] at [facility])  T2DM: HbA1c 6.6% in 2022. Pt believes he is borderline diabetic, does not regularly check blood sugars.  - Start SSI and monitor for steroid-induced hyperglycemia - Hold metformin  Osteoarthritis, chronic pain:  - Reorder hydrocodone q6h prn severe pain, otherwise pending med rec.   HTN:  - Did not take medications yet today, BP up but not severe. Takes 2 BP pills and can only reliably say that one of them is losartan. We will reorder that, order prn hydralazine and monitor.   HLD: Pt reports he  doesn't take anything for this. Last LDL in our system from 2020 was 88.   Coronary artery calcifications: Seen on CT.  - Recommend outpatient ischemic risk stratification  DVT prophylaxis: Lovenox  Code Status: Full  Family Communication: None at bedside Disposition Plan: Home at discharge Consults called: None  Admission status: Observation    Patrecia Pour, MD [facility] [email or web address] 11/12/2021, 4:07 PM

## 2021-11-12 NOTE — ED Provider Notes (Signed)
[facility] EMERGENCY DEPARTMENT Provider Note   CSN: [id number] Arrival date & time: 11/12/21  0920     History  Chief Complaint  Patient presents with   Shortness of Breath    Evan Brock is a 71 y.o. male.  Patient brought in by EMS from home for increased shortness of breath and work of breathing for the past 4 days.  Patient has a history of COPD.  Patient does not use oxygen at home.  Patient doing home nebulizer treatments without much relief.  EMS stated that oxygen saturations were 86% on room air.  They had diminished lung sounds with expiratory wheezing.  They gave him DuoNeb and gave him 125 mg of Solu-Medrol.  Patient now states he feels a little bit better.  Also patient was on 3 L of oxygen when I went in the room and he was satting 96%.  That was turned down to 2 L.  Patient denies any chest pain patient denies any leg swelling.  Past medical history significant for hypertension and COPD.  Patient was evaluated urgent care on January 4 for COPD exacerbation.  And patient was evaluated on August 25, 2021 for shortness of breath and hypoxia in the emergency department.  Last admission was in February 15, 2021 notes for acute kidney injury.      Home Medications Prior to Admission medications   Medication Sig Start Date End Date Taking? Authorizing Provider  albuterol (PROVENTIL) (2.5 MG/3ML) 0.083% nebulizer solution Take 3 mLs (2.5 mg total) by nebulization every 6 (six) hours as needed for wheezing or shortness of breath. 10/26/21   Raspet, Derry Skill, PA-C  albuterol (VENTOLIN HFA) 108 (90 Base) MCG/ACT inhaler INHALE 1 PUFF FOUR TIMES DAILY, AS NEEDED 10/26/21 10/26/22  Raspet, Junie Panning K, PA-C  amLODipine (NORVASC) 10 MG tablet Take 1 tablet (10 mg total) by mouth daily. 11/05/19   Kerin Perna, NP  atenolol (TENORMIN) 50 MG tablet Take 1 tablet (50 mg total) by mouth daily. 11/05/19   Kerin Perna, NP  atorvastatin (LIPITOR) 40 MG tablet Take 1 tablet  (40 mg total) by mouth daily. 11/05/19   Kerin Perna, NP  Blood Pressure Monitor KIT 1 kit by Does not apply route 3 (three) times daily as needed. 11/05/19   Kerin Perna, NP  Budeson-Glycopyrrol-Formoterol (BREZTRI AEROSPHERE) 160-9-4.8 MCG/ACT AERO Inhale 2 puffs into the lungs in the morning and at bedtime. 05/19/20   Collene Gobble, MD  chlorthalidone (HYGROTON) 25 MG tablet Take 25 mg by mouth daily. 12/09/20   [provider]  cloNIDine (CATAPRES) 0.1 MG tablet Take 1 tablet (0.1 mg total) by mouth daily. 11/05/19   Kerin Perna, NP  doxycycline (VIBRAMYCIN) 100 MG capsule Take 1 capsule (100 mg total) by mouth 2 (two) times daily. 10/26/21   Raspet, Derry Skill, PA-C  gabapentin (NEURONTIN) 800 MG tablet Take 800 mg by mouth 3 (three) times daily. 01/27/21   [provider]  HYDROcodone-acetaminophen (NORCO) 10-325 MG tablet Take 1 tablet by mouth every 6 (six) hours as needed for moderate pain or severe pain. 02/14/21   [provider]  losartan (COZAAR) 50 MG tablet Take 1 tablet (50 mg total) by mouth daily. 11/05/19   Kerin Perna, NP  metFORMIN (GLUCOPHAGE) 500 MG tablet Take 1 tablet (500 mg total) by mouth 2 (two) times daily with a meal. 11/05/19   Kerin Perna, NP  methocarbamol (ROBAXIN) 500 MG tablet Take 1 tablet (500  mg total) by mouth 2 (two) times daily as needed for muscle spasms. 05/25/21   Rayna Sexton, PA-C  predniSONE (STERAPRED UNI-PAK 21 TAB) 10 MG (21) TBPK tablet As directed 10/26/21   Raspet, Derry Skill, PA-C      Allergies    Patient has no known allergies.    Review of Systems   Review of Systems  Constitutional:  Negative for chills and fever.  HENT:  Negative for ear pain and sore throat.   Eyes:  Negative for pain and visual disturbance.  Respiratory:  Positive for shortness of breath and wheezing. Negative for cough.   Cardiovascular:  Negative for chest pain and palpitations.  Gastrointestinal:  Negative for  abdominal pain and vomiting.  Genitourinary:  Negative for dysuria and hematuria.  Musculoskeletal:  Negative for arthralgias and back pain.  Skin:  Negative for color change and rash.  Neurological:  Negative for seizures and syncope.  All other systems reviewed and are negative.  Physical Exam Updated Vital Signs BP (!) 147/88 (BP Location: Right Arm)    Pulse 91    Temp 98.5 F (36.9 C) (Oral)    Resp 20    Ht 1.753 m (_0 )    Wt 90.7 kg    SpO2 96%    BMI 29.53 kg/m  Physical Exam Vitals and nursing note reviewed.  Constitutional:      General: He is not in acute distress.    Appearance: Normal appearance. He is well-developed. He is not toxic-appearing.  HENT:     Head: Normocephalic and atraumatic.  Eyes:     Extraocular Movements: Extraocular movements intact.     Conjunctiva/sclera: Conjunctivae normal.     Pupils: Pupils are equal, round, and reactive to light.  Cardiovascular:     Rate and Rhythm: Normal rate and regular rhythm.     Heart sounds: No murmur heard. Pulmonary:     Effort: Pulmonary effort is normal. No respiratory distress.     Breath sounds: No stridor. Wheezing present. No rhonchi or rales.     Comments: Patient with some faint wheezing bilaterally Chest:     Chest wall: No tenderness.  Abdominal:     Palpations: Abdomen is soft.     Tenderness: There is no abdominal tenderness.  Musculoskeletal:        General: No swelling.     Cervical back: Normal range of motion and neck supple. No rigidity.     Right lower leg: No edema.     Left lower leg: No edema.  Skin:    General: Skin is warm and dry.     Capillary Refill: Capillary refill takes less than 2 seconds.  Neurological:     General: No focal deficit present.     Mental Status: He is alert and oriented to person, place, and time.  Psychiatric:        Mood and Affect: Mood normal.    ED Results / Procedures / Treatments   Labs (all labs ordered are listed, but only abnormal results  are displayed) Labs Reviewed  BASIC METABOLIC PANEL  CBC WITH DIFFERENTIAL/PLATELET    EKG EKG Interpretation  Date/Time:  Saturday November 12 2021 09:29:32 EST Ventricular Rate:  108 PR Interval:  171 QRS Duration: 84 QT Interval:  328 QTC Calculation: 440 R Axis:   90 Text Interpretation: Sinus tachycardia Borderline right axis deviation ST elevation, consider inferior injury No significant change since last tracing To include the inferior ST segment changes Confirmed by  Fredia Sorrow [phone]) on 11/12/2021 9:57:57 AM  Radiology DG Chest 2 View  Result Date: 11/12/2021 CLINICAL DATA:  Shortness of breath and COPD. EXAM: CHEST - 2 VIEW COMPARISON:  08/25/2021 FINDINGS: Normal cardiomediastinal contours. Bilateral lower lobe opacities are identified compatible with atelectasis and or pneumonia. No pleural effusion or interstitial edema. Visualized osseous structures are unremarkable. IMPRESSION: Bilateral lower lobe opacities compatible with atelectasis and/or pneumonia. Electronically Signed   By: Kerby Moors M.D.   On: 11/12/2021 10:39    Procedures Procedures    Medications Ordered in ED Medications - No data to display  ED Course/ Medical Decision Making/ A&P                           Medical Decision Making Amount and/or Complexity of Data Reviewed Labs: ordered. Radiology: ordered.   Patient's chest x-ray shows bilateral lower lobe possible opacities compatible with atelectasis and/or pneumonia.  Patient will need CT chest to further evaluate this I think in detail.  Patient without symptoms suggestive of pneumonia.  No fever.  Does appear that it is a COPD exacerbation.  Patient still with some bilateral wheezing but it is mild and overall he feels more comfortable and his oxygen sats on the 3 L of oxygen were 96%.  As stated above had turned it down to 2 L to see how we will do on that.  Patient was given Solu-Medrol by EMS.  Patient has no leg swelling.  Labs are  pending.  EKG showed some sinus tachycardia with heart rate in low 100s no significant change from previous EKGs    Final Clinical Impression(s) / ED Diagnoses Final diagnoses:  Chronic obstructive pulmonary disease with acute exacerbation ([place])  Hypoxia    Rx / DC Orders ED Discharge Orders     None         Fredia Sorrow, MD 11/12/21 1158

## 2021-11-13 DIAGNOSIS — E785 Hyperlipidemia, unspecified: Secondary | ICD-10-CM | POA: Diagnosis present

## 2021-11-13 DIAGNOSIS — E1169 Type 2 diabetes mellitus with other specified complication: Secondary | ICD-10-CM | POA: Diagnosis not present

## 2021-11-13 DIAGNOSIS — J9811 Atelectasis: Secondary | ICD-10-CM | POA: Diagnosis present

## 2021-11-13 DIAGNOSIS — J189 Pneumonia, unspecified organism: Secondary | ICD-10-CM | POA: Diagnosis present

## 2021-11-13 DIAGNOSIS — Z7984 Long term (current) use of oral hypoglycemic drugs: Secondary | ICD-10-CM | POA: Diagnosis not present

## 2021-11-13 DIAGNOSIS — Z7951 Long term (current) use of inhaled steroids: Secondary | ICD-10-CM | POA: Diagnosis not present

## 2021-11-13 DIAGNOSIS — E669 Obesity, unspecified: Secondary | ICD-10-CM | POA: Diagnosis present

## 2021-11-13 DIAGNOSIS — J9601 Acute respiratory failure with hypoxia: Secondary | ICD-10-CM | POA: Diagnosis present

## 2021-11-13 DIAGNOSIS — I10 Essential (primary) hypertension: Secondary | ICD-10-CM | POA: Diagnosis present

## 2021-11-13 DIAGNOSIS — Z79899 Other long term (current) drug therapy: Secondary | ICD-10-CM | POA: Diagnosis not present

## 2021-11-13 DIAGNOSIS — Z87891 Personal history of nicotine dependence: Secondary | ICD-10-CM | POA: Diagnosis not present

## 2021-11-13 DIAGNOSIS — Z20822 Contact with and (suspected) exposure to covid-19: Secondary | ICD-10-CM | POA: Diagnosis present

## 2021-11-13 DIAGNOSIS — J440 Chronic obstructive pulmonary disease with acute lower respiratory infection: Secondary | ICD-10-CM | POA: Diagnosis present

## 2021-11-13 DIAGNOSIS — G8929 Other chronic pain: Secondary | ICD-10-CM | POA: Diagnosis present

## 2021-11-13 DIAGNOSIS — E119 Type 2 diabetes mellitus without complications: Secondary | ICD-10-CM | POA: Diagnosis present

## 2021-11-13 DIAGNOSIS — Z96643 Presence of artificial hip joint, bilateral: Secondary | ICD-10-CM | POA: Diagnosis present

## 2021-11-13 DIAGNOSIS — J441 Chronic obstructive pulmonary disease with (acute) exacerbation: Secondary | ICD-10-CM | POA: Diagnosis present

## 2021-11-13 DIAGNOSIS — Z833 Family history of diabetes mellitus: Secondary | ICD-10-CM | POA: Diagnosis not present

## 2021-11-13 DIAGNOSIS — R0902 Hypoxemia: Secondary | ICD-10-CM | POA: Diagnosis present

## 2021-11-13 DIAGNOSIS — Z8249 Family history of ischemic heart disease and other diseases of the circulatory system: Secondary | ICD-10-CM | POA: Diagnosis not present

## 2021-11-13 LAB — GLUCOSE, CAPILLARY
Glucose-Capillary: 152 mg/dL — ABNORMAL HIGH (ref 70–99)
Glucose-Capillary: 141 mg/dL — ABNORMAL HIGH (ref 70–99)
Glucose-Capillary: 131 mg/dL — ABNORMAL HIGH (ref 70–99)
Glucose-Capillary: 142 mg/dL — ABNORMAL HIGH (ref 70–99)

## 2021-11-13 MED ORDER — IPRATROPIUM-ALBUTEROL 0.5-2.5 (3) MG/3ML IN SOLN: 3.0000 mL | Freq: Four times a day (QID) | RESPIRATORY_TRACT | Status: DC | PRN

## 2021-11-13 MED ORDER — IPRATROPIUM-ALBUTEROL 0.5-2.5 (3) MG/3ML IN SOLN
3.0000 mL | Freq: Every day | RESPIRATORY_TRACT | Status: DC
  Administered 2021-11-13 – 2021-11-14 (×2): 3 mL via RESPIRATORY_TRACT
  Filled 2021-11-13 (×2): qty 3

## 2021-11-13 MED ORDER — TIZANIDINE HCL 2 MG PO TABS
4.0000 mg | ORAL_TABLET | Freq: Three times a day (TID) | ORAL | Status: DC | PRN
  Administered 2021-11-13 – 2021-11-14 (×3): 4 mg via ORAL
  Filled 2021-11-13 (×3): qty 2

## 2021-11-13 MED ORDER — CHLORTHALIDONE 25 MG PO TABS
25.0000 mg | ORAL_TABLET | Freq: Every day | ORAL | Status: DC
  Administered 2021-11-13 – 2021-11-14 (×2): 25 mg via ORAL
  Filled 2021-11-13 (×2): qty 1

## 2021-11-13 NOTE — Evaluation (Signed)
Physical Therapy Evaluation Patient Details Name: Evan Brock MRN: [id number] DOB: 1951-09-30 Today's Date: 11/13/2021  History of Present Illness  Pt is a 71 y.o. male admitted with SOB.  W/U reveals PNA.  Patient with PMH history of COPD, HTN, T2DM, HLD  Clinical Impression  Patient presents as independent in all mobility.  Patient did have slight drop in O2 sat during gait, which is to be expected with PNA.  Encouraged patient to take several short walks throughout day versus long walks due to O2 saturations.         Recommendations for follow up therapy are one component of a multi-disciplinary discharge planning process, led by the attending physician.  Recommendations may be updated based on patient status, additional functional criteria and insurance authorization.  Follow Up Recommendations No PT follow up    Assistance Recommended at Discharge None  Patient can return home with the following       Equipment Recommendations None recommended by PT  Recommendations for Other Services       Functional Status Assessment       Precautions / Restrictions Precautions Precautions: None      Mobility  Bed Mobility               General bed mobility comments: sitting in chair upon arrival    Transfers Overall transfer level: Independent Equipment used: None                    Ambulation/Gait Ambulation/Gait assistance: Independent Gait Distance (Feet): 100 Feet Assistive device: None Gait Pattern/deviations: WFL(Within Functional Limits)          Stairs            Wheelchair Mobility    Modified Rankin (Stroke Patients Only)       Balance Overall balance assessment: Independent (accepted perturbations and quick stops with no loss of balance)                                           Pertinent Vitals/Pain Pain Assessment Pain Assessment: No/denies pain    Home Living Family/patient expects to be discharged to::  Private residence Living Arrangements: Spouse/significant other Available Help at Discharge: Family;Available 24 hours/day Type of Home: House Home Access: Level entry     Alternate Level Stairs-Number of Steps: 13 Home Layout: Two level Home Equipment: Cane - single Librarian, academic (2 wheels);BSC/3in1      Prior Function Prior Level of Function : Independent/Modified Independent                     Hand Dominance        Extremity/Trunk Assessment   Upper Extremity Assessment Upper Extremity Assessment: Overall WFL for tasks assessed    Lower Extremity Assessment Lower Extremity Assessment: Overall WFL for tasks assessed       Communication   Communication: No difficulties  Cognition Arousal/Alertness: Awake/alert Behavior During Therapy: WFL for tasks assessed/performed Overall Cognitive Status: Within Functional Limits for tasks assessed                                          General Comments      Exercises     Assessment/Plan    PT Assessment Patient does not need any further  PT services  PT Problem List         PT Treatment Interventions      PT Goals (Current goals can be found in the Care Plan section)  Acute Rehab PT Goals PT Goal Formulation: All assessment and education complete, DC therapy    Frequency       Co-evaluation               AM-PAC PT "6 Clicks" Mobility  Outcome Measure Help needed turning from your back to your side while in a flat bed without using bedrails?: None Help needed moving from lying on your back to sitting on the side of a flat bed without using bedrails?: None Help needed moving to and from a bed to a chair (including a wheelchair)?: None Help needed standing up from a chair using your arms (e.g., wheelchair or bedside chair)?: None Help needed to walk in hospital room?: None Help needed climbing 3-5 steps with a railing? : None 6 Click Score: 24    End of Session    Activity Tolerance: Patient tolerated treatment well Patient left: in chair;with call bell/phone within reach   PT Visit Diagnosis: Unsteadiness on feet (R26.81)    Time: 1131-1141 PT Time Calculation (min) (ACUTE ONLY): 10 min   Charges:   PT Evaluation $PT Eval Low Complexity: 1 Low          11/13/2021 Margie, PT Acute Rehabilitation Services Pager:  [phone] Office:  [phone]   Olivia Canter 11/13/2021, 11:48 AM

## 2021-11-13 NOTE — Progress Notes (Signed)
PROGRESS NOTE  Evan Brock  ZOX:[id number] DOB: 08-20-1951 DOA: 11/12/2021 PCP: Frederich Chick., MD   Brief Narrative: Evan Brock is a 71 y.o. male with a history of COPD, HTN, T2DM, HLD, and cervical degenerative joint disease who presented to the ED with worsening dyspnea and wheezing, found to be hypoxic. IV solumedrol and nebulized therapies were provided with some improvement, though remained newly hypoxic. CXR and subsequent CT chest revealed RML and bibasilar infiltrates consistent with pneumonia for which antibiotics were given and the patient was admitted for acute hypoxic respiratory failure due to COPD exacerbation provoked by CAP. He remains with an element of bronchospasm and hypoxic on exertion.   Assessment & Plan: Principal Problem:   Acute respiratory failure with hypoxia (HCC) Active Problems:   Essential hypertension, benign   CAP (community acquired pneumonia)   COPD with acute exacerbation (HCC)   Diabetes mellitus type 2 in obese (HCC)  Acute hypoxic respiratory failure due to AECOPD due to bilateral pneumonia: Pt has leukocytosis though has been on steroids. Afebrile. CT chest (my personal review) shows patchy airspace disease more in fitting with infectious process than atelectasis as well as bronchiectasis.  - Will continue ceftriaxone, azithromycin - Continue scheduled duoneb, prn albuterol.  - Incentive spirometry, flutter valve - Continue steroids, IV solumedrol wince still hypoxic with diminished breath sounds. Eventually convert to prednisone, possibly 1/23 - Will need follow up with Pulmonary ([facility])   T2DM: HbA1c 6.6% in 2022. Pt believes he is borderline diabetic, does not regularly check blood sugars.  - Start SSI and monitor for steroid-induced hyperglycemia - Hold metformin   Osteoarthritis, chronic pain:  - Reorder hydrocodone q6h prn severe pain, reorder tizanidine. Having L > R neck discomfort without radiculopathy.     HTN:  - Continue home losartan, chlorthalidone.    HLD: Pt reports he doesn't take anything for this. Last LDL in our system from 2020 was 88.    Coronary artery calcifications: Seen on CT.  - Recommend outpatient ischemic risk stratification  DVT prophylaxis: Lovenox Code Status: Full Family Communication: Wife at bedside Disposition Plan:  Status is: Observation  The patient will require care spanning > 2 midnights and should be moved to inpatient because: Continues to be exertionally hypoxemic (newly) with diminished breath sounds, not improved enough to be stable for discharge at this time.   Consultants:  None  Procedures:  None  Antimicrobials: Ceftriaxone, azithromycin   Subjective: Cough is stable, wheeze is improved from yesterday, no longer short of breath or hypoxic at rest. Still short of breath more than baseline with exertion and desaturated to 86% on room air walking around the room this morning. No chest pain. Felt tight, popping type pain in left neck radiating up the left posterior scalp. No numbness or weakness or radiation of pain down the arm.   Objective: Vitals:   11/13/21 0117 11/13/21 0455 11/13/21 0812 11/13/21 0857  BP:  (!) 147/97  (!) 164/80  Pulse: 66 77  95  Resp: Temp:  (!) 97.4 F (36.3 C)  98.7 F (37.1 C)  TempSrc:  Oral  Oral  SpO2: 99% 100% 93% 93%  Weight:      Height:        Intake/Output Summary (Last 24 hours) at 11/13/2021 1133 Last data filed at 11/13/2021 0900 Gross per 24 hour  Intake 240 ml  Output 1 ml  Net 239 ml   [employer]  11/12/21 0927  Weight: 90.7 kg    Gen: 71 y.o. male in no distress  Pulm: Non-labored tachypnea with end-expiratory wheezing, prolonged expiration and diminished breath sounds.  CV: Regular rate and rhythm. No murmur, rub, or gallop. No JVD, no pedal edema. GI: Abdomen soft, non-tender, non-distended, with normoactive bowel sounds. No organomegaly or masses felt. Ext: Warm,  no deformities.  Skin: No rashes, lesions or ulcers Neuro: Alert and oriented. No focal neurological deficits. Psych: Judgement and insight appear normal. Mood & affect appropriate.   Data Reviewed: I have personally reviewed following labs and imaging studies  CBC: Recent Labs  Lab 11/12/21 1012  WBC 11.9*  NEUTROABS 9.6*  HGB 12.5*  HCT 40.7  MCV 85.3  PLT 175   Basic Metabolic Panel: Recent Labs  Lab 11/12/21 1012  NA 136  K 4.3  CL 97*  CO2 30  GLUCOSE 134*  BUN 16  CREATININE 0.92  CALCIUM 9.0   GFR: Estimated Creatinine Clearance: 82 mL/min (by C-G formula based on SCr of 0.92 mg/dL). Liver Function Tests: No results for input(s): AST, ALT, ALKPHOS, BILITOT, PROT, ALBUMIN in the last 168 hours. No results for input(s): LIPASE, AMYLASE in the last 168 hours. No results for input(s): AMMONIA in the last 168 hours. Coagulation Profile: No results for input(s): INR, PROTIME in the last 168 hours. Cardiac Enzymes: No results for input(s): CKTOTAL, CKMB, CKMBINDEX, TROPONINI in the last 168 hours. BNP (last 3 results) No results for input(s): PROBNP in the last 8760 hours. HbA1C: No results for input(s): HGBA1C in the last 72 hours. CBG: Recent Labs  Lab 11/12/21 2059 11/13/21 0652 11/13/21 1123  GLUCAP 215* 152* 141*   Lipid Profile: No results for input(s): CHOL, HDL, LDLCALC, TRIG, CHOLHDL, LDLDIRECT in the last 72 hours. Thyroid Function Tests: No results for input(s): TSH, T4TOTAL, FREET4, T3FREE, THYROIDAB in the last 72 hours. Anemia Panel: No results for input(s): VITAMINB12, FOLATE, FERRITIN, TIBC, IRON, RETICCTPCT in the last 72 hours. Urine analysis:    Component Value Date/Time   COLORURINE YELLOW 05/25/2021 1805   APPEARANCEUR CLEAR 05/25/2021 1805   LABSPEC 1.019 05/25/2021 1805   PHURINE 6.0 05/25/2021 1805   GLUCOSEU NEGATIVE 05/25/2021 1805   HGBUR NEGATIVE 05/25/2021 1805   BILIRUBINUR NEGATIVE 05/25/2021 1805   KETONESUR NEGATIVE  05/25/2021 1805   PROTEINUR NEGATIVE 05/25/2021 1805   UROBILINOGEN 1.0 10/01/2013 1851   NITRITE NEGATIVE 05/25/2021 1805   LEUKOCYTESUR NEGATIVE 05/25/2021 1805   Recent Results (from the past 240 hour(s))  Resp Panel by RT-PCR (Flu A&B, Covid) Nasopharyngeal Swab     Status: None   Collection Time: 11/12/21  1:19 PM   Specimen: Nasopharyngeal Swab; Nasopharyngeal(NP) swabs in vial transport medium  Result Value Ref Range Status   SARS Coronavirus 2 by RT PCR NEGATIVE NEGATIVE Final    Comment: (NOTE) SARS-CoV-2 target nucleic acids are NOT DETECTED.  The SARS-CoV-2 RNA is generally detectable in upper respiratory specimens during the acute phase of infection. The lowest concentration of SARS-CoV-2 viral copies this assay can detect is 138 copies/mL. A negative result does not preclude SARS-Cov-2 infection and should not be used as the sole basis for treatment or other patient management decisions. A negative result may occur with  improper specimen collection/handling, submission of specimen other than nasopharyngeal swab, presence of viral mutation(s) within the areas targeted by this assay, and inadequate number of viral copies(<138 copies/mL). A negative result must be combined with clinical observations, patient history, and epidemiological information. The  expected result is Negative.  Fact Sheet for Patients:  BloggerCourse.com  Fact Sheet for Healthcare Providers:  SeriousBroker.it  This test is no t yet approved or cleared by the [place] FDA and  has been authorized for detection and/or diagnosis of SARS-CoV-2 by FDA under an Emergency Use Authorization (EUA). This EUA will remain  in effect (meaning this test can be used) for the duration of the COVID-19 declaration under Section 564(b)(1) of the Act, 21 U.S.C.section 360bbb-3(b)(1), unless the authorization is terminated  or revoked sooner.       Influenza  A by PCR NEGATIVE NEGATIVE Final   Influenza B by PCR NEGATIVE NEGATIVE Final    Comment: (NOTE) The Xpert Xpress SARS-CoV-2/FLU/RSV plus assay is intended as an aid in the diagnosis of influenza from Nasopharyngeal swab specimens and should not be used as a sole basis for treatment. Nasal washings and aspirates are unacceptable for Xpert Xpress SARS-CoV-2/FLU/RSV testing.  Fact Sheet for Patients: BloggerCourse.com  Fact Sheet for Healthcare Providers: SeriousBroker.it  This test is not yet approved or cleared by the [place] FDA and has been authorized for detection and/or diagnosis of SARS-CoV-2 by FDA under an Emergency Use Authorization (EUA). This EUA will remain in effect (meaning this test can be used) for the duration of the COVID-19 declaration under Section 564(b)(1) of the Act, 21 U.S.C. section 360bbb-3(b)(1), unless the authorization is terminated or revoked.  Performed at [facility], [address], [place], [place] [address]   Culture, blood (routine x 2) Call MD if unable to obtain prior to antibiotics being given     Status: None (Preliminary result)   Collection Time: 11/12/21  8:15 PM   Specimen: BLOOD  Result Value Ref Range Status   Specimen Description BLOOD RIGHT ANTECUBITAL  Final   Special Requests AEROBIC BOTTLE ONLY Blood Culture adequate volume  Final   Culture   Final    NO GROWTH < 12 HOURS Performed at [facility], [address] [place], [place] [address]    Report Status PENDING  Incomplete  Culture, blood (routine x 2) Call MD if unable to obtain prior to antibiotics being given     Status: None (Preliminary result)   Collection Time: 11/12/21  8:25 PM   Specimen: BLOOD  Result Value Ref Range Status   Specimen Description BLOOD RIGHT ANTECUBITAL  Final   Special Requests AEROBIC BOTTLE ONLY Blood Culture adequate volume  Final   Culture   Final    NO GROWTH < 12  HOURS Performed at [facility], [address] [place], [place] [address]    Report Status PENDING  Incomplete      Radiology Studies: DG Chest 2 View  Result Date: 11/12/2021 CLINICAL DATA:  Shortness of breath and COPD. EXAM: CHEST - 2 VIEW COMPARISON:  08/25/2021 FINDINGS: Normal cardiomediastinal contours. Bilateral lower lobe opacities are identified compatible with atelectasis and or pneumonia. No pleural effusion or interstitial edema. Visualized osseous structures are unremarkable. IMPRESSION: Bilateral lower lobe opacities compatible with atelectasis and/or pneumonia. Electronically Signed   By: Signa Kell M.D.   On: 11/12/2021 10:39   CT Chest W Contrast  Result Date: 11/12/2021 CLINICAL DATA:  71 year old male with increasing shortness of breath. EXAM: CT CHEST WITH CONTRAST TECHNIQUE: Multidetector CT imaging of the chest was performed during intravenous contrast administration. RADIATION DOSE REDUCTION: This exam was performed according to the departmental dose-optimization program which includes automated exposure control, adjustment of the mA  and/or kV according to patient size and/or use of iterative reconstruction technique. CONTRAST:  OMNIPAQUE IOHEXOL 300 MG/ML  SOLN COMPARISON:  08/25/2021 CT and prior studies FINDINGS: Cardiovascular: UPPER limits normal heart size noted. Coronary artery and aortic atherosclerotic calcifications are again identified. There is no evidence of thoracic aortic aneurysm or pericardial effusion. Mediastinum/Nodes: Mildly prominent RIGHT hilar lymph nodes are again noted. No other enlarged lymph nodes are identified. Thyroid, trachea and esophagus are unremarkable. Lungs/Pleura: Cylindrical bronchiectasis identified within both LOWER lobes and the RIGHT middle lobe. Patchy bilateral LOWER lobe and RIGHT middle lobe airspace opacities are noted likely representing infection/pneumonia. No discrete mass, pleural effusion or pneumothorax  identified. Upper Abdomen: No acute abnormality. Musculoskeletal: No acute or suspicious bony abnormalities are noted. IMPRESSION: 1. Patchy bilateral LOWER lobe and RIGHT middle lobe airspace opacities likely representing infection/pneumonia. 2. Cylindrical bronchiectasis within bilateral LOWER lobes and the RIGHT middle lobe. 3. Coronary artery disease. 4. Aortic Atherosclerosis (ICD10-I70.0). Electronically Signed   By: Harmon Pier M.D.   On: 11/12/2021 15:32    Scheduled Meds:  azithromycin  250 mg Oral Daily   enoxaparin (LOVENOX) injection  40 mg Subcutaneous Q24H   insulin aspart  0-5 Units Subcutaneous QHS   insulin aspart  0-9 Units Subcutaneous TID WC   ipratropium-albuterol  3 mL Nebulization Q6H   losartan  50 mg Oral Daily   methylPREDNISolone (SOLU-MEDROL) injection  40 mg Intravenous Q12H   Followed by   Melene Muller ON 11/14/2021] predniSONE  40 mg Oral Q breakfast   Continuous Infusions:  cefTRIAXone (ROCEPHIN)  IV       LOS: 0 days    Tyrone Nine, MD [facility] [email or web address] 11/13/2021, 11:33 AM

## 2021-11-13 NOTE — Progress Notes (Signed)
RT note. Patient educated on flutter valve, patient able to show good technique. Patient was able to do 10 breaths. RT will continue to monitor.

## 2021-11-14 DIAGNOSIS — J189 Pneumonia, unspecified organism: Secondary | ICD-10-CM | POA: Diagnosis not present

## 2021-11-14 DIAGNOSIS — J441 Chronic obstructive pulmonary disease with (acute) exacerbation: Secondary | ICD-10-CM | POA: Diagnosis not present

## 2021-11-14 DIAGNOSIS — J9601 Acute respiratory failure with hypoxia: Secondary | ICD-10-CM | POA: Diagnosis not present

## 2021-11-14 DIAGNOSIS — E1169 Type 2 diabetes mellitus with other specified complication: Secondary | ICD-10-CM | POA: Diagnosis not present

## 2021-11-14 LAB — GLUCOSE, CAPILLARY
Glucose-Capillary: 155 mg/dL — ABNORMAL HIGH (ref 70–99)
Glucose-Capillary: 102 mg/dL — ABNORMAL HIGH (ref 70–99)

## 2021-11-14 LAB — HEMOGLOBIN A1C
Hgb A1c MFr Bld: 6.8 % — ABNORMAL HIGH (ref 4.8–5.6)
Mean Plasma Glucose: 148 mg/dL

## 2021-11-14 MED ORDER — CEFDINIR 300 MG PO CAPS: 300.0000 mg | ORAL_CAPSULE | Freq: Two times a day (BID) | ORAL | 0 refills | Status: DC

## 2021-11-14 MED ORDER — ALBUTEROL SULFATE (2.5 MG/3ML) 0.083% IN NEBU: 2.5000 mg | INHALATION_SOLUTION | RESPIRATORY_TRACT | 0 refills | Status: DC | PRN

## 2021-11-14 MED ORDER — PREDNISONE 20 MG PO TABS: ORAL_TABLET | ORAL | 0 refills | Status: AC

## 2021-11-14 NOTE — TOC Transition Note (Signed)
Transition of Care [facility]) - CM/SW Discharge Note   Patient Details  Name: Evan Brock MRN: [id number] Date of Birth: 1951-07-09  Transition of Care [facility]) CM/SW Contact:  Tom-Johnson, Renea Ee, RN Phone Number: 11/14/2021, 3:23 PM   Clinical Narrative:    Patient is scheduled for discharge today. Patient is from home with spouse and have two supportive children. Admitted for Acute Respiratory Failure with Hypoxia. Home Oxygen order placed and lists of companies given to patient and he chose [employer]. Order called in to [facility] and acceptance voiced. Has a cane, walker and shower chair at home. PCP is Royce Macadamia, Mike Gip., MD and uses [facility] pharmacy on [address]. No PT followup and no recommendations noted. Wife to transport at discharge. No further TOC needs noted.   Final next level of care: Home/Self Care Barriers to Discharge: Barriers Resolved   Patient Goals and CMS Choice Patient states their goals for this hospitalization and ongoing recovery are:: To return home CMS Medicare.gov Compare Post Acute Care list provided to:: Patient Choice offered to / list presented to : Patient, Spouse  Discharge Placement                       Discharge Plan and Services                DME Arranged: Oxygen DME Agency: [facility] Date DME Agency Contacted: 11/14/21 Time DME Agency Contacted: [phone] Representative spoke with at DME Agency: [place]: NA [place] Agency: NA        Social Determinants of Health ([place]) Interventions     Readmission Risk Interventions No flowsheet data found.

## 2021-11-14 NOTE — Progress Notes (Signed)
Nutrition Brief Note  RD consulted to assess pt as part of COPD protocol.   Admitting Dx: Hypoxia [R09.02] Acute respiratory failure with hypoxia (HCC) [J96.01] Chronic obstructive pulmonary disease with acute exacerbation (HCC) [J44.1] Community acquired pneumonia of right lower lobe of lung [J18.9] PMH:  Past Medical History:  Diagnosis Date   Arthritis    "hips" (10/01/2013)   Asthma    COPD (chronic obstructive pulmonary disease) (HCC)    Exertional shortness of breath    Hypertension     Medications:  Scheduled Meds:  azithromycin  250 mg Oral Daily   chlorthalidone  25 mg Oral Daily   enoxaparin (LOVENOX) injection  40 mg Subcutaneous Q24H   insulin aspart  0-5 Units Subcutaneous QHS   insulin aspart  0-9 Units Subcutaneous TID WC   ipratropium-albuterol  3 mL Nebulization Daily   losartan  50 mg Oral Daily   predniSONE  40 mg Oral Q breakfast  Continuous Infusions:  cefTRIAXone (ROCEPHIN)  IV 1 g (11/13/21 1728)    Labs: Recent Labs  Lab 11/12/21 1012  NA 136  K 4.3  CL 97*  CO2 30  BUN 16  CREATININE 0.92  CALCIUM 9.0  GLUCOSE 134*     Wt Readings from Last 15 Encounters:  11/12/21 90.7 kg  02/16/21 98.9 kg  07/12/20 93 kg  05/19/20 91.4 kg  08/20/19 91.8 kg  08/05/19 94.1 kg  05/05/19 94.7 kg  02/03/19 102.8 kg  08/05/18 93.2 kg  06/03/18 93.8 kg  02/22/18 93.1 kg  01/17/18 92.8 kg  12/03/17 93.4 kg  11/05/17 93.9 kg  10/30/17 90.7 kg    Body mass index is 29.53 kg/m. Patient meets criteria for overweight based on current BMI.   Current diet order is heart healthy, patient is consuming approximately 60-100% of meals at this time.   Per MD, pt is medically stable for discharge. Discharge orders have been signed.   No nutrition interventions warranted at this time. If nutrition issues arise, please consult RD.    Theone Stanley., MS, RD, LDN (she/her/hers) RD pager number and weekend/on-call pager number located in [place].

## 2021-11-14 NOTE — Progress Notes (Deleted)
Nutrition Brief Note  RD consulted tp assess pt as part of COPD protocol.   Admitting Dx: Hypoxia [R09.02] Acute respiratory failure with hypoxia (HCC) [J96.01] Chronic obstructive pulmonary disease with acute exacerbation (HCC) [J44.1] Community acquired pneumonia of right lower lobe of lung [J18.9] PMH:  Past Medical History:  Diagnosis Date   Arthritis    "hips" (10/01/2013)   Asthma    COPD (chronic obstructive pulmonary disease) (HCC)    Exertional shortness of breath    Hypertension     Medications:  Scheduled Meds:  azithromycin  250 mg Oral Daily   chlorthalidone  25 mg Oral Daily   enoxaparin (LOVENOX) injection  40 mg Subcutaneous Q24H   insulin aspart  0-5 Units Subcutaneous QHS   insulin aspart  0-9 Units Subcutaneous TID WC   ipratropium-albuterol  3 mL Nebulization Daily   losartan  50 mg Oral Daily   predniSONE  40 mg Oral Q breakfast  Continuous Infusions:  cefTRIAXone (ROCEPHIN)  IV 1 g (11/13/21 1728)    Labs: Recent Labs  Lab 11/12/21 1012  NA 136  K 4.3  CL 97*  CO2 30  BUN 16  CREATININE 0.92  CALCIUM 9.0  GLUCOSE 134*    Wt Readings from Last 15 Encounters:  11/12/21 90.7 kg  02/16/21 98.9 kg  07/12/20 93 kg  05/19/20 91.4 kg  08/20/19 91.8 kg  08/05/19 94.1 kg  05/05/19 94.7 kg  02/03/19 102.8 kg  08/05/18 93.2 kg  06/03/18 93.8 kg  02/22/18 93.1 kg  01/17/18 92.8 kg  12/03/17 93.4 kg  11/05/17 93.9 kg  10/30/17 90.7 kg    Body mass index is 29.53 kg/m. Patient meets criteria for overweight based on current BMI.   Current diet order is heart healthy, patient is consuming approximately 60-100% of meals at this time.   Per MD, pt is medically stable for discharge. Discharge orders have been signed.   No nutrition interventions warranted at this time. If nutrition issues arise, please consult RD.    Rae Lips., MS, RD, LDN (she/her/hers) RD pager number and weekend/on-call pager number located in [place].

## 2021-11-14 NOTE — Progress Notes (Signed)
SATURATION QUALIFICATIONS: (This note is used to comply with regulatory documentation for home oxygen)  Patient Saturations on Room Air at Rest = 92%  Patient Saturations on Room Air while Ambulating = 87%  Patient Saturations on 2 Liters of oxygen while Ambulating = 93%  Please briefly explain why patient needs home oxygen: pt oxygen dropped when ambulating

## 2021-11-14 NOTE — Discharge Summary (Signed)
Physician Discharge Summary  Anselmo Reihl XJO:[id number] DOB: 1951/09/17 DOA: 11/12/2021  PCP: Sherald Hess., MD  Admit date: 11/12/2021 Discharge date: 11/14/2021  Admitted From: Home Disposition: Home   Recommendations for Outpatient Follow-up:  Follow up with PCP and pulmonary specialist in the next 1-2 weeks. Repeat ambulatory pulse oximetry. Discharged on exertional supplemental oxygen. Consider ischemic work up for coronary artery calcifications seen on CT.   Home Health: None Equipment/Devices: 2L O2 with ambulation Discharge Condition: Stable CODE STATUS: Full Diet recommendation: Heart healthy, carb-modified  Brief/Interim Summary: Samir Ishaq is a 71 y.o. male with a history of COPD, HTN, T2DM, HLD, and cervical degenerative joint disease who presented to the ED with worsening dyspnea and wheezing, found to be hypoxic. IV solumedrol and nebulized therapies were provided with some improvement, though remained newly hypoxic. CXR and subsequent CT chest revealed RML and bibasilar infiltrates consistent with pneumonia for which antibiotics were given and the patient was admitted for acute hypoxic respiratory failure due to COPD exacerbation provoked by CAP. With steroids, antibiotics, and bronchodilators, the patient's hypoxia resolved, respiratory effort normalized, and he feels significantly improved. He will continue oral antibiotics, steroid taper, and nebulized albuterol at home.  Discharge Diagnoses:  Principal Problem:   Acute respiratory failure with hypoxia (HCC) Active Problems:   Essential hypertension, benign   CAP (community acquired pneumonia)   COPD with acute exacerbation ([place])   Diabetes mellitus type 2 in obese ([place])  Acute hypoxic respiratory failure due to AECOPD due to bilateral pneumonia: Pt has leukocytosis though has been on steroids. Afebrile. CT chest shows patchy airspace disease more in fitting with infectious process than atelectasis as well as  bronchiectasis.  - Will continue antibiotics. PCT reassuring. Complete course w/cefdinir. - Continue scheduled albuterol (neb refill prescribed at DC) - Incentive spirometry, flutter valve - Continue prednisone with taper.  - Will need follow up with Pulmonary (all care at [facility])   T2DM: HbA1c 6.6% in 2022. Pt believes he is borderline diabetic, does not regularly check blood sugars.  - Continue home med, PCP f/u recommended.   Osteoarthritis, chronic pain:  - Continue hydrocodone, tizanidine. Having L > R neck discomfort without radiculopathy. Has outpatient follow up already scheduled.   HTN:  - Continue home losartan, chlorthalidone.    HLD: Pt reports he doesn't take anything for this. Last LDL in our system from 2020 was 88.    Coronary artery calcifications: Seen on CT.  - Recommend outpatient ischemic risk stratification  Discharge Instructions Discharge Instructions     Diet - low sodium heart healthy   Complete by: As directed    Discharge instructions   Complete by: As directed    You were admitted for pneumonia which precipitated a COPD exacerbation. Symptoms have improved and you are stable for discharge home with the following recommendations:  - Complete the course of antibiotics with cefdinir 31m twice daily for 5 more days - Complete a steroid taper by taking prednisone 46mfor 2 days, then 2011mor 2 days, then 57m40mr 2 days.  - Continue using albuterol nebulizer as needed. You may also take this three times per day regardless of wheezing or shortness of breath for the next few days. A new prescription was sent to your [facility] pharmacy.  - Please follow up with your PCP and pulmonary doctor in the next 1-2 weeks, or seek medical attention right away if your symptoms worsen/return.   Increase activity slowly   Complete by: As directed  Allergies as of 11/14/2021   No Known Allergies      Medication List     STOP taking these  medications    amLODipine 10 MG tablet Commonly known as: NORVASC   atenolol 50 MG tablet Commonly known as: TENORMIN   atorvastatin 40 MG tablet Commonly known as: LIPITOR   cloNIDine 0.1 MG tablet Commonly known as: CATAPRES   doxycycline 100 MG capsule Commonly known as: VIBRAMYCIN   methocarbamol 500 MG tablet Commonly known as: ROBAXIN   predniSONE 10 MG (21) Tbpk tablet Commonly known as: STERAPRED UNI-PAK 21 TAB Replaced by: predniSONE 20 MG tablet       TAKE these medications    albuterol 108 (90 Base) MCG/ACT inhaler Commonly known as: VENTOLIN HFA INHALE 1 PUFF FOUR TIMES DAILY, AS NEEDED What changed:  how much to take when to take this reasons to take this   albuterol (2.5 MG/3ML) 0.083% nebulizer solution Commonly known as: PROVENTIL Take 3 mLs (2.5 mg total) by nebulization every 4 (four) hours as needed for wheezing or shortness of breath. What changed: when to take this   Blood Pressure Monitor Kit 1 kit by Does not apply route 3 (three) times daily as needed.   Breztri Aerosphere 160-9-4.8 MCG/ACT Aero Generic drug: Budeson-Glycopyrrol-Formoterol Inhale 2 puffs into the lungs in the morning and at bedtime.   cefdinir 300 MG capsule Commonly known as: OMNICEF Take 1 capsule (300 mg total) by mouth 2 (two) times daily.   chlorthalidone 25 MG tablet Commonly known as: HYGROTON Take 25 mg by mouth daily.   HYDROcodone-acetaminophen 10-325 MG tablet Commonly known as: NORCO Take 1 tablet by mouth every 6 (six) hours as needed for moderate pain or severe pain.   losartan 50 MG tablet Commonly known as: COZAAR Take 1 tablet (50 mg total) by mouth daily.   metFORMIN 500 MG tablet Commonly known as: GLUCOPHAGE Take 1 tablet (500 mg total) by mouth 2 (two) times daily with a meal.   predniSONE 20 MG tablet Commonly known as: DELTASONE Take 2 tablets (40 mg total) by mouth daily with breakfast for 2 days, THEN 1 tablet (20 mg total) daily  with breakfast for 2 days, THEN 0.5 tablets (10 mg total) daily with breakfast for 2 days. Start taking on: November 15, 2021 Replaces: predniSONE 10 MG (21) Tbpk tablet   tamsulosin 0.4 MG Caps capsule Commonly known as: FLOMAX Take 0.4 mg by mouth daily.   tiZANidine 4 MG tablet Commonly known as: ZANAFLEX Take 4 mg by mouth every 8 (eight) hours as needed for muscle spasms.        Follow-up Information     Sherald Hess., MD. Schedule an appointment as soon as possible for a visit.   Specialty: Family Medicine Contact information: [place] [address] [phone]                No Known Allergies  Consultations: None  Procedures/Studies: DG Chest 2 View  Result Date: 11/12/2021 CLINICAL DATA:  Shortness of breath and COPD. EXAM: CHEST - 2 VIEW COMPARISON:  08/25/2021 FINDINGS: Normal cardiomediastinal contours. Bilateral lower lobe opacities are identified compatible with atelectasis and or pneumonia. No pleural effusion or interstitial edema. Visualized osseous structures are unremarkable. IMPRESSION: Bilateral lower lobe opacities compatible with atelectasis and/or pneumonia. Electronically Signed   By: Kerby Moors M.D.   On: 11/12/2021 10:39   CT Chest W Contrast  Result Date: 11/12/2021 CLINICAL DATA:  71 year old male  with increasing shortness of breath. EXAM: CT CHEST WITH CONTRAST TECHNIQUE: Multidetector CT imaging of the chest was performed during intravenous contrast administration. RADIATION DOSE REDUCTION: This exam was performed according to the departmental dose-optimization program which includes automated exposure control, adjustment of the mA and/or kV according to patient size and/or use of iterative reconstruction technique. CONTRAST:  160m OMNIPAQUE IOHEXOL 300 MG/ML  SOLN COMPARISON:  08/25/2021 CT and prior studies FINDINGS: Cardiovascular: UPPER limits normal heart size noted. Coronary artery and aortic atherosclerotic  calcifications are again identified. There is no evidence of thoracic aortic aneurysm or pericardial effusion. Mediastinum/Nodes: Mildly prominent RIGHT hilar lymph nodes are again noted. No other enlarged lymph nodes are identified. Thyroid, trachea and esophagus are unremarkable. Lungs/Pleura: Cylindrical bronchiectasis identified within both LOWER lobes and the RIGHT middle lobe. Patchy bilateral LOWER lobe and RIGHT middle lobe airspace opacities are noted likely representing infection/pneumonia. No discrete mass, pleural effusion or pneumothorax identified. Upper Abdomen: No acute abnormality. Musculoskeletal: No acute or suspicious bony abnormalities are noted. IMPRESSION: 1. Patchy bilateral LOWER lobe and RIGHT middle lobe airspace opacities likely representing infection/pneumonia. 2. Cylindrical bronchiectasis within bilateral LOWER lobes and the RIGHT middle lobe. 3. Coronary artery disease. 4. Aortic Atherosclerosis (ICD10-I70.0). Electronically Signed   By: JMargarette CanadaM.D.   On: 11/12/2021 15:32     Subjective: Feels better, wants to go home. No wheezing. No chest pain. Had dyspnea with exertion though this has improved.  Discharge Exam: Vitals:   11/13/21 2031 11/14/21 0900  BP: (!) 151/80 123/83  Pulse: 72 92  Resp: 18 18  Temp: 97.9 F (36.6 C) 97.7 F (36.5 C)  SpO2: 96% 95%   General: Pt is alert, awake, not in acute distress Cardiovascular: RRR, S1/S2 +, no rubs, no gallops Respiratory: Nonlabored, normal rate, diminished globally but improved aeration and no longer with prolonged expiration or wheezing. Abdominal: Soft, NT, ND, bowel sounds + Extremities: No edema, no cyanosis  Labs: BNP (last 3 results) Recent Labs    02/15/21 2347 08/25/21 1201  BNP 13.3 276.7  Basic Metabolic Panel: Recent Labs  Lab 11/12/21 1012  NA 136  K 4.3  CL 97*  CO2 30  GLUCOSE 134*  BUN 16  CREATININE 0.92  CALCIUM 9.0   Liver Function Tests: No results for input(s): AST,  ALT, ALKPHOS, BILITOT, PROT, ALBUMIN in the last 168 hours. No results for input(s): LIPASE, AMYLASE in the last 168 hours. No results for input(s): AMMONIA in the last 168 hours. CBC: Recent Labs  Lab 11/12/21 1012  WBC 11.9*  NEUTROABS 9.6*  HGB 12.5*  HCT 40.7  MCV 85.3  PLT 175   Cardiac Enzymes: No results for input(s): CKTOTAL, CKMB, CKMBINDEX, TROPONINI in the last 168 hours. BNP: Invalid input(s): POCBNP CBG: Recent Labs  Lab 11/13/21 0652 11/13/21 1123 11/13/21 1626 11/13/21 2158 11/14/21 0839  GLUCAP 152* 141* 131* 142* 155*   D-Dimer No results for input(s): DDIMER in the last 72 hours. Hgb A1c No results for input(s): HGBA1C in the last 72 hours. Lipid Profile No results for input(s): CHOL, HDL, LDLCALC, TRIG, CHOLHDL, LDLDIRECT in the last 72 hours. Thyroid function studies No results for input(s): TSH, T4TOTAL, T3FREE, THYROIDAB in the last 72 hours.  Invalid input(s): FREET3 Anemia work up No results for input(s): VITAMINB12, FOLATE, FERRITIN, TIBC, IRON, RETICCTPCT in the last 72 hours. Urinalysis    Component Value Date/Time   COLORURINE YELLOW 05/25/2021 1805   APPEARANCEUR CLEAR 05/25/2021 1805   LABSPEC 1.019  05/25/2021 1805   PHURINE 6.0 05/25/2021 1805   GLUCOSEU NEGATIVE 05/25/2021 1805   HGBUR NEGATIVE 05/25/2021 1805   BILIRUBINUR NEGATIVE 05/25/2021 1805   KETONESUR NEGATIVE 05/25/2021 1805   PROTEINUR NEGATIVE 05/25/2021 1805   UROBILINOGEN 1.0 10/01/2013 1851   NITRITE NEGATIVE 05/25/2021 1805   LEUKOCYTESUR NEGATIVE 05/25/2021 1805    Microbiology Recent Results (from the past 240 hour(s))  Resp Panel by RT-PCR (Flu A&B, Covid) Nasopharyngeal Swab     Status: None   Collection Time: 11/12/21  1:19 PM   Specimen: Nasopharyngeal Swab; Nasopharyngeal(NP) swabs in vial transport medium  Result Value Ref Range Status   SARS Coronavirus 2 by RT PCR NEGATIVE NEGATIVE Final    Comment: (NOTE) SARS-CoV-2 target nucleic acids are NOT  DETECTED.  The SARS-CoV-2 RNA is generally detectable in upper respiratory specimens during the acute phase of infection. The lowest concentration of SARS-CoV-2 viral copies this assay can detect is 138 copies/mL. A negative result does not preclude SARS-Cov-2 infection and should not be used as the sole basis for treatment or other patient management decisions. A negative result may occur with  improper specimen collection/handling, submission of specimen other than nasopharyngeal swab, presence of viral mutation(s) within the areas targeted by this assay, and inadequate number of viral copies(<138 copies/mL). A negative result must be combined with clinical observations, patient history, and epidemiological information. The expected result is Negative.  Fact Sheet for Patients:  EntrepreneurPulse.com.au  Fact Sheet for Healthcare Providers:  IncredibleEmployment.be  This test is no t yet approved or cleared by the [place] FDA and  has been authorized for detection and/or diagnosis of SARS-CoV-2 by FDA under an Emergency Use Authorization (EUA). This EUA will remain  in effect (meaning this test can be used) for the duration of the COVID-19 declaration under Section 564(b)(1) of the Act, 21 U.S.C.section 360bbb-3(b)(1), unless the authorization is terminated  or revoked sooner.       Influenza A by PCR NEGATIVE NEGATIVE Final   Influenza B by PCR NEGATIVE NEGATIVE Final    Comment: (NOTE) The Xpert Xpress SARS-CoV-2/FLU/RSV plus assay is intended as an aid in the diagnosis of influenza from Nasopharyngeal swab specimens and should not be used as a sole basis for treatment. Nasal washings and aspirates are unacceptable for Xpert Xpress SARS-CoV-2/FLU/RSV testing.  Fact Sheet for Patients: EntrepreneurPulse.com.au  Fact Sheet for Healthcare Providers: IncredibleEmployment.be  This test is not yet  approved or cleared by the [place] FDA and has been authorized for detection and/or diagnosis of SARS-CoV-2 by FDA under an Emergency Use Authorization (EUA). This EUA will remain in effect (meaning this test can be used) for the duration of the COVID-19 declaration under Section 564(b)(1) of the Act, 21 U.S.C. section 360bbb-3(b)(1), unless the authorization is terminated or revoked.  Performed at [facility], [place] [address] [place], [place] [address]   Culture, blood (routine x 2) Call MD if unable to obtain prior to antibiotics being given     Status: None (Preliminary result)   Collection Time: 11/12/21  8:15 PM   Specimen: BLOOD  Result Value Ref Range Status   Specimen Description BLOOD RIGHT ANTECUBITAL  Final   Special Requests AEROBIC BOTTLE ONLY Blood Culture adequate volume  Final   Culture   Final    NO GROWTH 2 DAYS Performed at [facility], [name] [place], [place] [address]    Report Status PENDING  Incomplete  Culture, blood (routine x 2) Call  MD if unable to obtain prior to antibiotics being given     Status: None (Preliminary result)   Collection Time: 11/12/21  8:25 PM   Specimen: BLOOD  Result Value Ref Range Status   Specimen Description BLOOD RIGHT ANTECUBITAL  Final   Special Requests AEROBIC BOTTLE ONLY Blood Culture adequate volume  Final   Culture   Final    NO GROWTH 2 DAYS Performed at [facility], [address] [place], [place] [address]    Report Status PENDING  Incomplete    Time coordinating discharge: Approximately 40 minutes  Patrecia Pour, MD  [facility] 11/14/2021, 9:30 AM

## 2021-11-14 NOTE — TOC Initial Note (Deleted)
Transition of Care [facility]) - Initial/Assessment Note    Patient Details  Name: Shemuel Harkleroad MRN: [id number] Date of Birth: 02/22/1951  [facility] CM/SW Contact:    Tom-Johnson, Hershal Coria, RN Phone Number: 11/14/2021, 3:22 PM  Clinical Narrative:                     Barriers to Discharge: Barriers Resolved   Patient Goals and CMS Choice Patient states their goals for this hospitalization and ongoing recovery are:: To return home CMS Medicare.gov Compare Post Acute Care list provided to:: Patient Choice offered to / list presented to : Patient, Spouse  Expected Discharge Plan and Services           Expected Discharge Date: 11/14/21               DME Arranged: Oxygen DME Agency: [employer] Date DME Agency Contacted: 11/14/21 Time DME Agency Contacted: [phone] Representative spoke with at DME Agency: Ian Malkin HH Arranged: NA HH Agency: NA        Prior Living Arrangements/Services                       Activities of Daily Living Home Assistive Devices/Equipment: None ADL Screening (condition at time of admission) Patient's cognitive ability adequate to safely complete daily activities?: Yes Is the patient deaf or have difficulty hearing?: No Does the patient have difficulty seeing, even when wearing glasses/contacts?: No Does the patient have difficulty concentrating, remembering, or making decisions?: No Patient able to express need for assistance with ADLs?: Yes Does the patient have difficulty dressing or bathing?: No Independently performs ADLs?: Yes (appropriate for developmental age) Does the patient have difficulty walking or climbing stairs?: No Weakness of Legs: None Weakness of Arms/Hands: None  Permission Sought/Granted                  Emotional Assessment              Admission diagnosis:  Hypoxia [R09.02] Acute respiratory failure with hypoxia (HCC) [J96.01] Chronic obstructive pulmonary disease with acute  exacerbation (HCC) [J44.1] Community acquired pneumonia of right lower lobe of lung [J18.9] Patient Active Problem List   Diagnosis Date Noted   Acute respiratory failure with hypoxia (HCC) 11/12/2021   CAP (community acquired pneumonia) 02/16/2021   Hypoxia 02/16/2021   COPD with acute exacerbation (HCC) 02/16/2021   Diabetes mellitus type 2 in obese (HCC) 02/16/2021   AKI (acute kidney injury) (HCC) 02/16/2021   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   Leukocytosis 10/04/2015   COPD (chronic obstructive pulmonary disease) (HCC) 10/01/2015   Essential hypertension, benign 03/01/2015   Asthma with acute exacerbation 01/26/2015   Thrombocytopenia, unspecified (HCC) 11/06/2013   Acute respiratory failure (HCC) with hypoxia 10/01/2013   Acute renal failure (HCC) 10/01/2013   PCP:  Frederich Chick., MD Pharmacy:   [facility] [phone] - [place], [place] - [address] AT [facility] [address] [place] [address] Phone: [phone] Fax: [phone]     Social Determinants of Health (SDOH) Interventions    Readmission Risk Interventions No flowsheet data found.

## 2021-11-14 NOTE — Progress Notes (Signed)
DISCHARGE NOTE HOME Adarryl Goldammer to be discharged Home per MD order. Discussed prescriptions and follow up appointments with the patient. Prescriptions given to patient; medication list explained in detail. Patient verbalized understanding.  Skin clean, dry and intact without evidence of skin break down, no evidence of skin tears noted. IV catheter discontinued intact. Site without signs and symptoms of complications. Dressing and pressure applied. Pt denies pain at the site currently. No complaints noted.  Patient free of lines, drains, and wounds.   An After Visit Summary (AVS) was printed and given to the patient. Patient escorted via wheelchair, and discharged home via private auto.  Rosalita Carey S Sherlene Rickel, RN

## 2021-11-17 LAB — CULTURE, BLOOD (ROUTINE X 2)
Culture: NO GROWTH
Culture: NO GROWTH
Special Requests: ADEQUATE
Special Requests: ADEQUATE

## 2021-12-11 IMAGING — CT CT ANGIO CHEST
2 of 6 series · 18 of 36 positions shown · IV contrast (omnipaque)
Comparison: CT angiography chest 03/09/2021

CLINICAL DATA: PE suspected, low/intermediate prob, positive
D-dimer

EXAM:
CT ANGIOGRAPHY CHEST WITH CONTRAST
TECHNIQUE: Multidetector CT imaging of the chest was performed using the
standard protocol during bolus administration of intravenous
contrast. Multiplanar CT image reconstructions and MIPs were
obtained to evaluate the vascular anatomy.
CONTRAST:  50mL OMNIPAQUE IOHEXOL 350 MG/ML SOLN

[Series 6: pe thins · axial · 0.87mm/px · z∈[+1378,+1641]mm · 17 of 418 slices shown]
[im 21/418  lung]
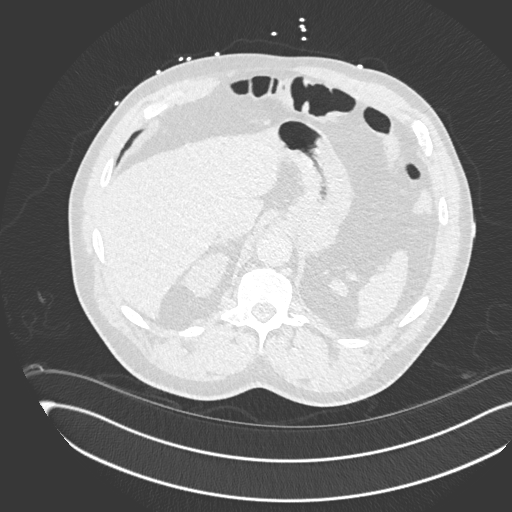
[im 42/418  mediastinal]
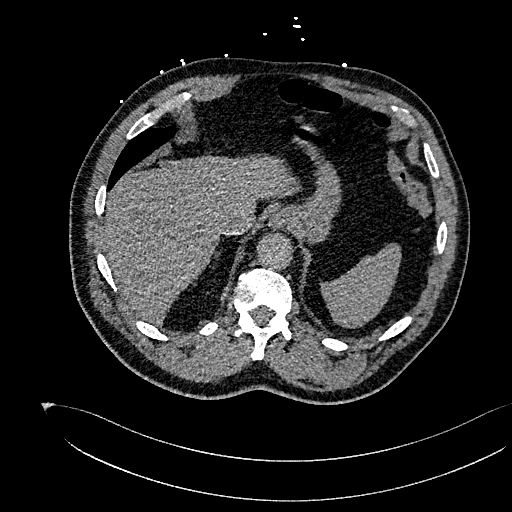
[im 63/418  lung]
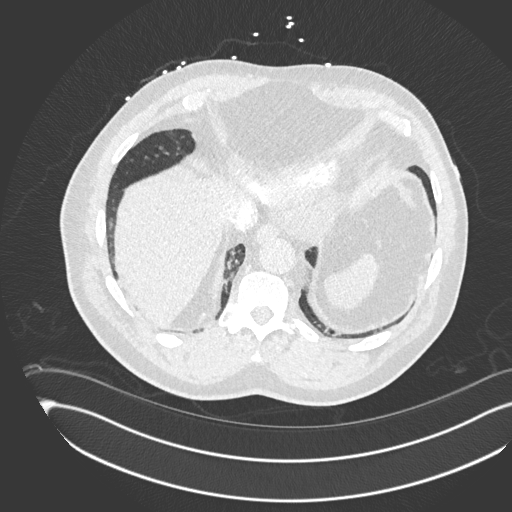
[im 84/418  mediastinal]
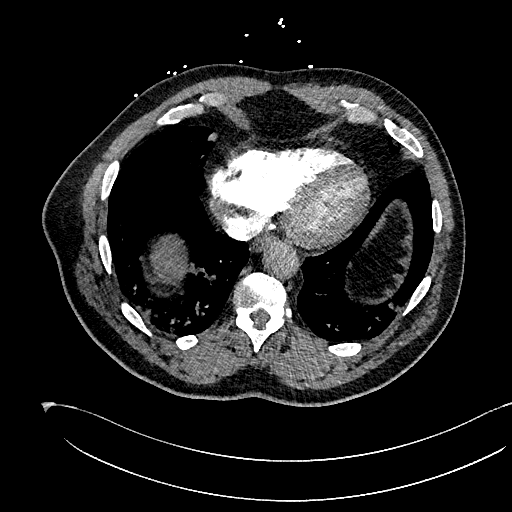
[im 126/418  lung]
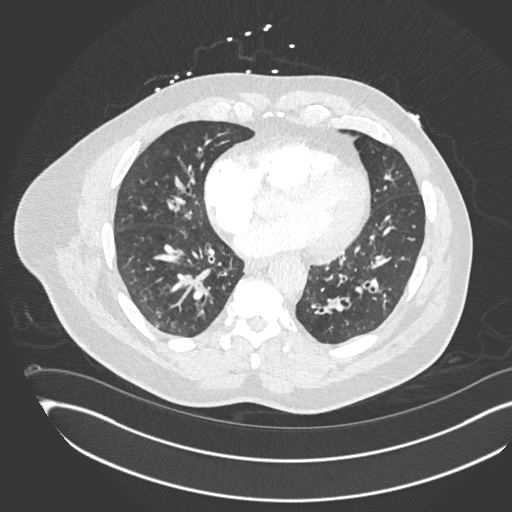
[im 146/418  mediastinal]
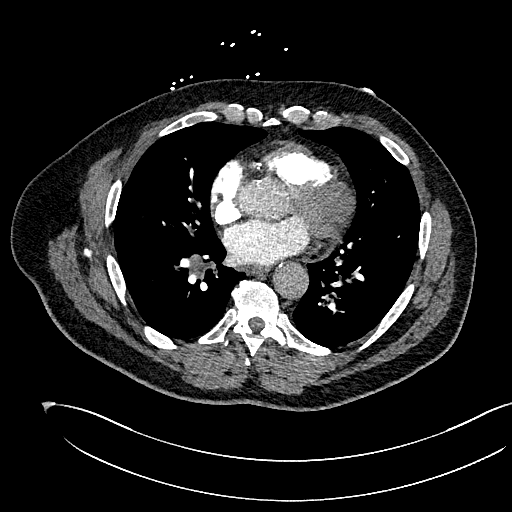
[im 167/418  lung]
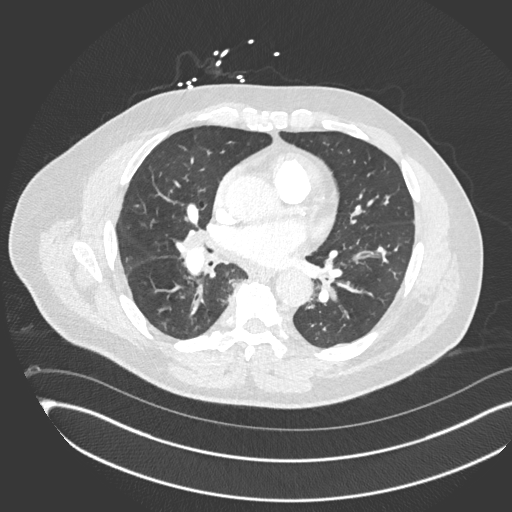
[im 188/418  mediastinal]
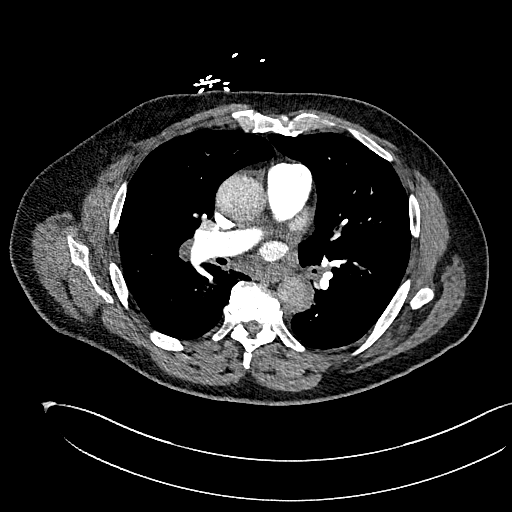
[im 209/418  lung]
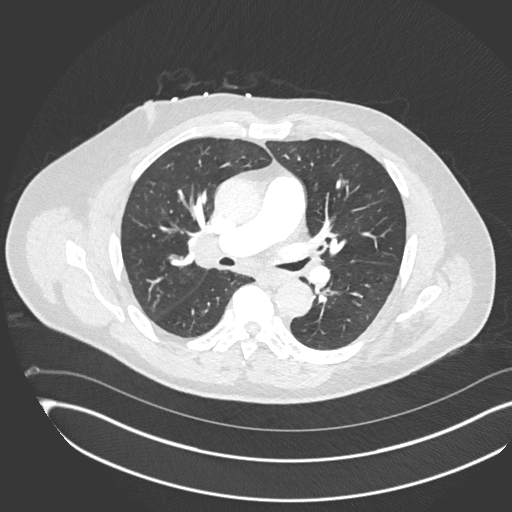
[im 230/418  mediastinal]
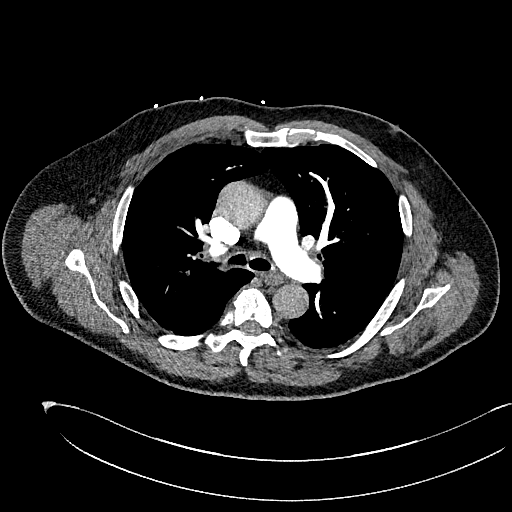
[im 251/418  lung]
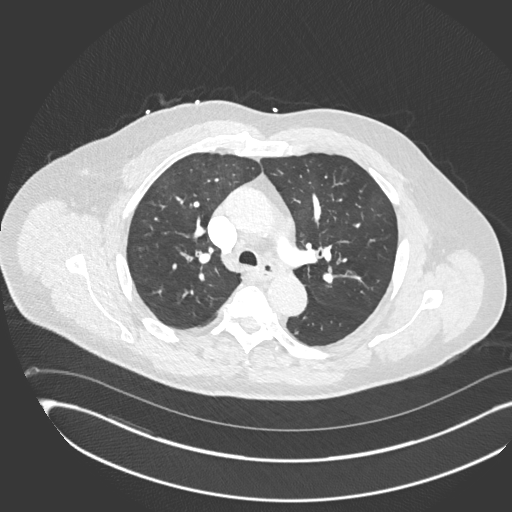
[im 272/418  mediastinal]
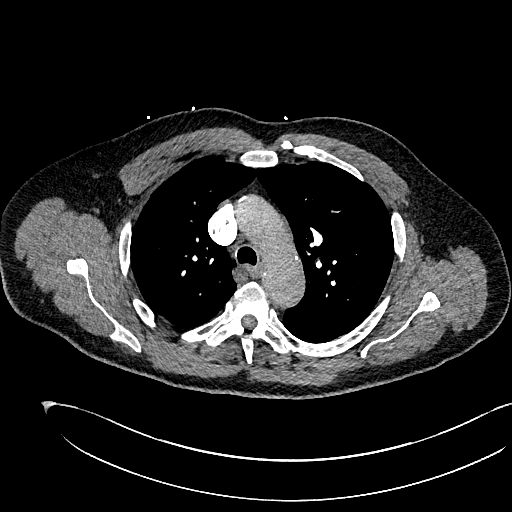
[im 292/418  lung]
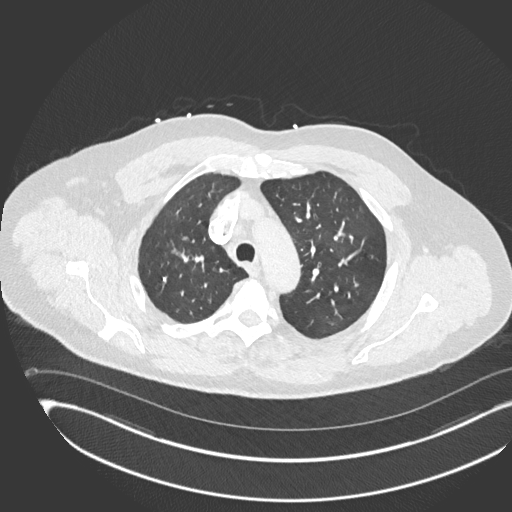
[im 334/418  mediastinal]
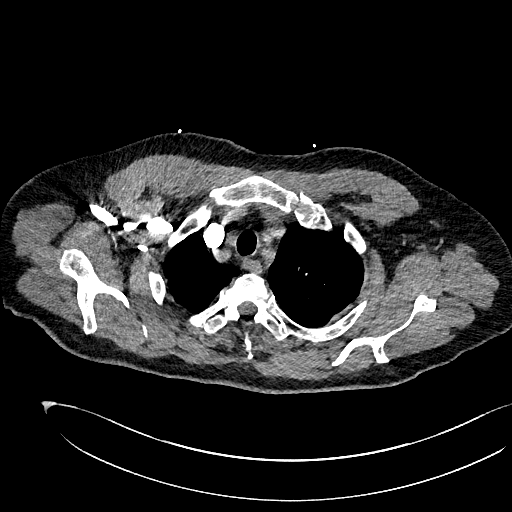
[im 355/418  lung]
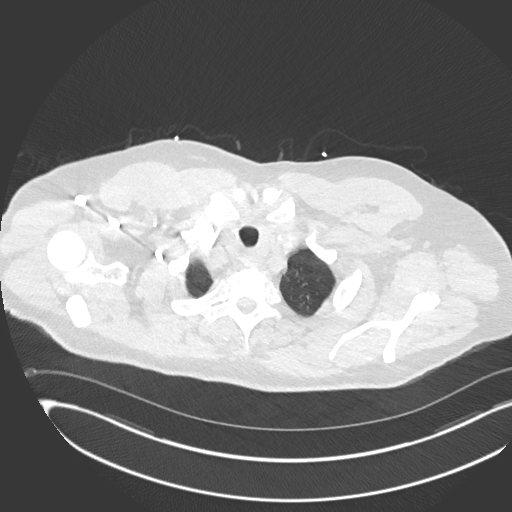
[im 376/418  mediastinal]
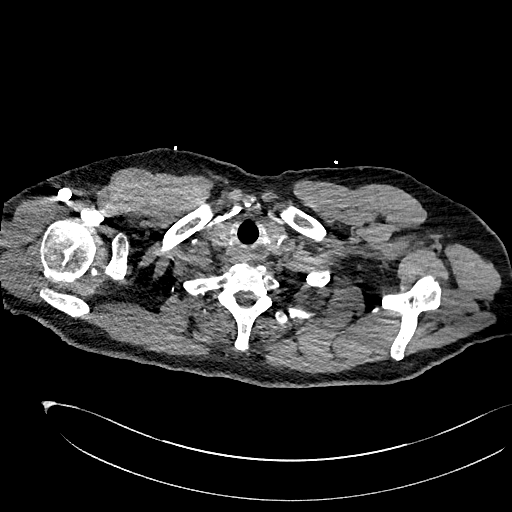
[im 397/418  lung]
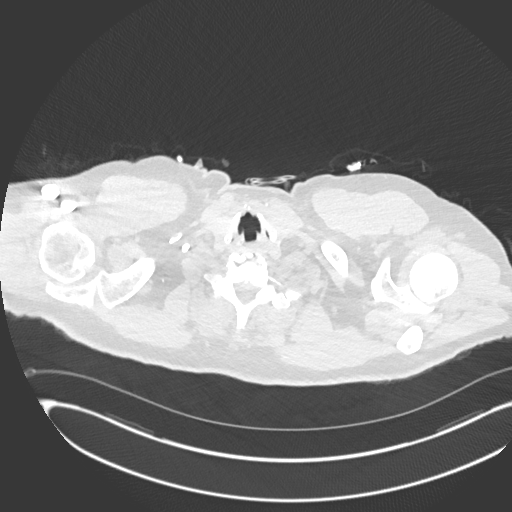

[Series 8: pe 2mm cor · coronal · 0.59mm/px · 1 of 148 slices shown]
[im 74/148  mediastinal]
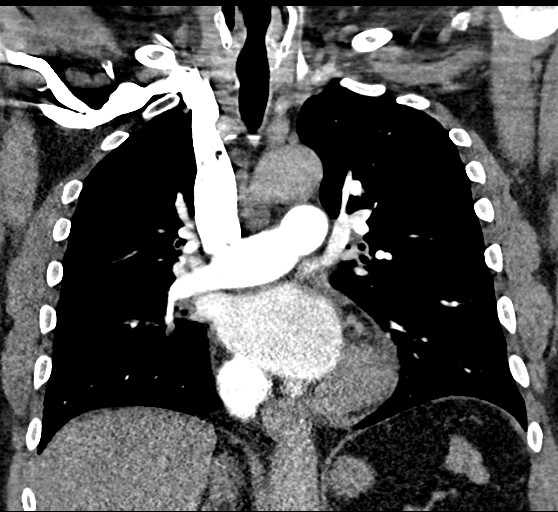

[18 of 36 positions shown; findings below may reference images not displayed]

FINDINGS: Cardiovascular: Satisfactory opacification of the pulmonary arteries
to the segmental level. No evidence of pulmonary embolism. Normal
heart size. No pericardial effusion.

Mediastinum/Nodes: Interval increase in size of an enlarged right
hilar lymph node measuring 2.2 cm. Interval development of right
interlobular lymphadenopathy ([DATE], 265). No enlarged mediastinal,
left hilar, or axillary lymph nodes. Thyroid gland, trachea, and
esophagus demonstrate no significant findings.

Lungs/Pleura: Interval increase in diffuse bronchial thickening that
is most prominent within the bilateral lower lobes. Interval
increase in bilateral lower lobe and inferior bilateral upper lobe
tree-in-bud nodularity. Slightly consolidative density versus
atelectasis within lingula. No pulmonary mass. No pleural effusion.
No pneumothorax.

Upper Abdomen: No acute abnormality.

Musculoskeletal:

No chest wall abnormality.

No suspicious lytic or blastic osseous lesions. No acute displaced
fracture. Multilevel degenerative changes of the spine.

Review of the MIP images confirms the above findings.
IMPRESSION: 1. No pulmonary embolus.
2. Interval increase in diffuse bronchial wall thickening and
infection/inflammation likely representing an atypical mycobacterial
infection. No follow-up needed if patient is low-risk (and has no
known or suspected primary neoplasm). Non-contrast chest CT can be
considered in 12 months if patient is high-risk. This recommendation
follows the consensus statement: Guidelines for Management of
Incidental Pulmonary Nodules Detected on CT Images: From the
3. Interval increase in right hilar and interval development of
right interlobular lymphadenopathy.

## 2022-01-21 DIAGNOSIS — I2510 Atherosclerotic heart disease of native coronary artery without angina pectoris: Secondary | ICD-10-CM

## 2022-01-21 HISTORY — DX: Atherosclerotic heart disease of native coronary artery without angina pectoris: I25.10

## 2022-02-16 ENCOUNTER — Emergency Department (HOSPITAL_COMMUNITY): Payer: Medicare HMO

## 2022-02-16 ENCOUNTER — Encounter (HOSPITAL_COMMUNITY): Payer: Self-pay | Admitting: Emergency Medicine

## 2022-02-16 ENCOUNTER — Other Ambulatory Visit: Payer: Self-pay

## 2022-02-16 ENCOUNTER — Inpatient Hospital Stay (HOSPITAL_COMMUNITY)
Admission: EM | Admit: 2022-02-16 | Discharge: 2022-02-18 | DRG: 247 | Disposition: A | Discharge status: Home / Self Care | Payer: Medicare HMO | Attending: Cardiology | Admitting: Cardiology

## 2022-02-16 ENCOUNTER — Encounter (HOSPITAL_COMMUNITY)
Admission: EM | Disposition: A | Discharge status: Home / Self Care | Payer: Self-pay | Source: Home / Self Care | Attending: Cardiology

## 2022-02-16 DIAGNOSIS — R778 Other specified abnormalities of plasma proteins: Secondary | ICD-10-CM | POA: Diagnosis present

## 2022-02-16 DIAGNOSIS — R7989 Other specified abnormal findings of blood chemistry: Secondary | ICD-10-CM

## 2022-02-16 DIAGNOSIS — Z9861 Coronary angioplasty status: Secondary | ICD-10-CM

## 2022-02-16 DIAGNOSIS — E119 Type 2 diabetes mellitus without complications: Secondary | ICD-10-CM | POA: Diagnosis present

## 2022-02-16 DIAGNOSIS — Z6831 Body mass index (BMI) 31.0-31.9, adult: Secondary | ICD-10-CM

## 2022-02-16 DIAGNOSIS — I10 Essential (primary) hypertension: Secondary | ICD-10-CM | POA: Diagnosis present

## 2022-02-16 DIAGNOSIS — I2510 Atherosclerotic heart disease of native coronary artery without angina pectoris: Secondary | ICD-10-CM | POA: Diagnosis present

## 2022-02-16 DIAGNOSIS — Z7984 Long term (current) use of oral hypoglycemic drugs: Secondary | ICD-10-CM

## 2022-02-16 DIAGNOSIS — I214 Non-ST elevation (NSTEMI) myocardial infarction: Secondary | ICD-10-CM | POA: Diagnosis present

## 2022-02-16 DIAGNOSIS — Z955 Presence of coronary angioplasty implant and graft: Secondary | ICD-10-CM

## 2022-02-16 DIAGNOSIS — Z96643 Presence of artificial hip joint, bilateral: Secondary | ICD-10-CM | POA: Diagnosis present

## 2022-02-16 DIAGNOSIS — J449 Chronic obstructive pulmonary disease, unspecified: Secondary | ICD-10-CM | POA: Diagnosis present

## 2022-02-16 DIAGNOSIS — E0800 Diabetes mellitus due to underlying condition with hyperosmolarity without nonketotic hyperglycemic-hyperosmolar coma (NKHHC): Secondary | ICD-10-CM

## 2022-02-16 DIAGNOSIS — Z833 Family history of diabetes mellitus: Secondary | ICD-10-CM

## 2022-02-16 DIAGNOSIS — Z87898 Personal history of other specified conditions: Secondary | ICD-10-CM

## 2022-02-16 DIAGNOSIS — I252 Old myocardial infarction: Secondary | ICD-10-CM

## 2022-02-16 DIAGNOSIS — R079 Chest pain, unspecified: Principal | ICD-10-CM

## 2022-02-16 DIAGNOSIS — E6609 Other obesity due to excess calories: Secondary | ICD-10-CM | POA: Diagnosis present

## 2022-02-16 DIAGNOSIS — Z8249 Family history of ischemic heart disease and other diseases of the circulatory system: Secondary | ICD-10-CM | POA: Diagnosis not present

## 2022-02-16 DIAGNOSIS — Z87891 Personal history of nicotine dependence: Secondary | ICD-10-CM | POA: Diagnosis not present

## 2022-02-16 DIAGNOSIS — E785 Hyperlipidemia, unspecified: Secondary | ICD-10-CM | POA: Diagnosis present

## 2022-02-16 DIAGNOSIS — Z7951 Long term (current) use of inhaled steroids: Secondary | ICD-10-CM | POA: Diagnosis not present

## 2022-02-16 DIAGNOSIS — Z79899 Other long term (current) drug therapy: Secondary | ICD-10-CM | POA: Diagnosis not present

## 2022-02-16 DIAGNOSIS — E782 Mixed hyperlipidemia: Secondary | ICD-10-CM

## 2022-02-16 HISTORY — PX: CORONARY THROMBECTOMY: CATH118304

## 2022-02-16 HISTORY — PX: LEFT HEART CATH AND CORONARY ANGIOGRAPHY: CATH118249

## 2022-02-16 HISTORY — PX: CORONARY STENT INTERVENTION: CATH118234

## 2022-02-16 HISTORY — DX: Old myocardial infarction: I25.2

## 2022-02-16 LAB — BASIC METABOLIC PANEL
Anion gap: 5 (ref 5–15)
BUN: 13 mg/dL (ref 8–23)
CO2: 37 mmol/L — ABNORMAL HIGH (ref 22–32)
Calcium: 9.2 mg/dL (ref 8.9–10.3)
Chloride: 100 mmol/L (ref 98–111)
Creatinine, Ser: 1.1 mg/dL (ref 0.61–1.24)
GFR, Estimated: >60 mL/min (ref >60)
Glucose, Bld: 156 mg/dL — ABNORMAL HIGH (ref 70–99)
Potassium: 4 mmol/L (ref 3.5–5.1)
Sodium: 142 mmol/L (ref 135–145)

## 2022-02-16 LAB — TROPONIN I (HIGH SENSITIVITY)
Troponin I (High Sensitivity): 18 ng/L — ABNORMAL HIGH (ref <18)
Troponin I (High Sensitivity): 158 ng/L (ref <18)
Troponin I (High Sensitivity): >24000 ng/L (ref <18)

## 2022-02-16 LAB — CBC
HCT: 42.3 % (ref 39.0–52.0)
HCT: 40.7 % (ref 39.0–52.0)
Hemoglobin: 12.9 g/dL — ABNORMAL LOW (ref 13.0–17.0)
Hemoglobin: 12.6 g/dL — ABNORMAL LOW (ref 13.0–17.0)
MCH: 25.4 pg — ABNORMAL LOW (ref 26.0–34.0)
MCH: 25.7 pg — ABNORMAL LOW (ref 26.0–34.0)
MCHC: 30.5 g/dL (ref 30.0–36.0)
MCHC: 31 g/dL (ref 30.0–36.0)
MCV: 83.4 fL (ref 80.0–100.0)
MCV: 82.9 fL (ref 80.0–100.0)
Platelets: 326 10*3/uL (ref 150–400)
Platelets: 299 10*3/uL (ref 150–400)
RBC: 5.07 MIL/uL (ref 4.22–5.81)
RBC: 4.91 MIL/uL (ref 4.22–5.81)
RDW: 14.7 % (ref 11.5–15.5)
RDW: 14.6 % (ref 11.5–15.5)
WBC: 7.9 10*3/uL (ref 4.0–10.5)
WBC: 11.7 10*3/uL — ABNORMAL HIGH (ref 4.0–10.5)
nRBC: 0 % (ref 0.0–0.2)
nRBC: 0 % (ref 0.0–0.2)

## 2022-02-16 LAB — POCT ACTIVATED CLOTTING TIME
Activated Clotting Time: 606 seconds
Activated Clotting Time: 287 seconds

## 2022-02-16 LAB — BRAIN NATRIURETIC PEPTIDE: B Natriuretic Peptide: 13 pg/mL (ref 0.0–100.0)

## 2022-02-16 LAB — D-DIMER, QUANTITATIVE: D-Dimer, Quant: 1.27 ug/mL-FEU — ABNORMAL HIGH (ref 0.00–0.50)

## 2022-02-16 LAB — HEPARIN LEVEL (UNFRACTIONATED): Heparin Unfractionated: <0.1 IU/mL — ABNORMAL LOW (ref 0.30–0.70)

## 2022-02-16 SURGERY — LEFT HEART CATH AND CORONARY ANGIOGRAPHY
Anesthesia: LOCAL

## 2022-02-16 MED ORDER — HEPARIN (PORCINE) 25000 UT/250ML-% IV SOLN
1200.0000 [IU]/h | INTRAVENOUS | Status: DC
  Administered 2022-02-16: 1200 [IU]/h via INTRAVENOUS
  Filled 2022-02-16: qty 250

## 2022-02-16 MED ORDER — TICAGRELOR 90 MG PO TABS
ORAL_TABLET | ORAL | Status: DC | PRN
  Administered 2022-02-16: 180 mg via ORAL

## 2022-02-16 MED ORDER — TAMSULOSIN HCL 0.4 MG PO CAPS
0.8000 mg | ORAL_CAPSULE | Freq: Every day | ORAL | Status: DC
Start: 2022-02-16 — End: 2022-02-18
  Administered 2022-02-16 – 2022-02-17 (×2): 0.8 mg via ORAL
  Filled 2022-02-16 (×2): qty 2

## 2022-02-16 MED ORDER — FENTANYL CITRATE PF 50 MCG/ML IJ SOSY
25.0000 ug | PREFILLED_SYRINGE | Freq: Once | INTRAMUSCULAR | Status: AC
  Administered 2022-02-16: 25 ug via INTRAVENOUS
  Filled 2022-02-16: qty 1

## 2022-02-16 MED ORDER — SODIUM CHLORIDE 0.9% FLUSH: 3.0000 mL | INTRAVENOUS | Status: DC | PRN

## 2022-02-16 MED ORDER — SODIUM CHLORIDE 0.9% FLUSH
3.0000 mL | Freq: Two times a day (BID) | INTRAVENOUS | Status: DC
  Administered 2022-02-17 – 2022-02-18 (×2): 3 mL via INTRAVENOUS

## 2022-02-16 MED ORDER — VERAPAMIL HCL 2.5 MG/ML IV SOLN
INTRAVENOUS | Status: DC | PRN
  Administered 2022-02-16: 10 mL via INTRA_ARTERIAL

## 2022-02-16 MED ORDER — SODIUM CHLORIDE 0.9 % IV BOLUS
INTRAVENOUS | Status: AC | PRN
  Administered 2022-02-16: 250 mL via INTRAVENOUS

## 2022-02-16 MED ORDER — MIDAZOLAM HCL 2 MG/2ML IJ SOLN
INTRAMUSCULAR | Status: AC
  Filled 2022-02-16: qty 2

## 2022-02-16 MED ORDER — ONDANSETRON HCL 4 MG/2ML IJ SOLN: 4.0000 mg | Freq: Four times a day (QID) | INTRAMUSCULAR | Status: DC | PRN

## 2022-02-16 MED ORDER — HEPARIN (PORCINE) IN NACL 1000-0.9 UT/500ML-% IV SOLN
INTRAVENOUS | Status: DC | PRN
  Administered 2022-02-16 (×2): 500 mL

## 2022-02-16 MED ORDER — IOHEXOL 350 MG/ML SOLN
INTRAVENOUS | Status: DC | PRN
Start: 2022-02-16 — End: 2022-02-16
  Administered 2022-02-16: 125 mL

## 2022-02-16 MED ORDER — ASPIRIN EC 81 MG PO TBEC
81.0000 mg | DELAYED_RELEASE_TABLET | Freq: Every day | ORAL | Status: DC
  Administered 2022-02-17 – 2022-02-18 (×2): 81 mg via ORAL
  Filled 2022-02-16 (×2): qty 1

## 2022-02-16 MED ORDER — CHLORTHALIDONE 25 MG PO TABS
25.0000 mg | ORAL_TABLET | Freq: Every day | ORAL | Status: DC
  Filled 2022-02-16: qty 1

## 2022-02-16 MED ORDER — NITROGLYCERIN IN D5W 200-5 MCG/ML-% IV SOLN
INTRAVENOUS | Status: AC
  Filled 2022-02-16: qty 250

## 2022-02-16 MED ORDER — TIROFIBAN (AGGRASTAT) BOLUS VIA INFUSION
INTRAVENOUS | Status: DC | PRN
  Administered 2022-02-16: 2382.5 ug via INTRAVENOUS

## 2022-02-16 MED ORDER — HYDRALAZINE HCL 20 MG/ML IJ SOLN: 10.0000 mg | INTRAMUSCULAR | Status: AC | PRN

## 2022-02-16 MED ORDER — VERAPAMIL HCL 2.5 MG/ML IV SOLN
INTRAVENOUS | Status: AC
  Filled 2022-02-16: qty 2

## 2022-02-16 MED ORDER — SODIUM CHLORIDE 0.9 % IV SOLN: INTRAVENOUS | Status: AC

## 2022-02-16 MED ORDER — ALBUTEROL SULFATE (2.5 MG/3ML) 0.083% IN NEBU: 2.5000 mg | INHALATION_SOLUTION | RESPIRATORY_TRACT | Status: DC | PRN

## 2022-02-16 MED ORDER — NITROGLYCERIN IN D5W 200-5 MCG/ML-% IV SOLN
INTRAVENOUS | Status: AC | PRN
  Administered 2022-02-16: 10 ug/min via INTRAVENOUS

## 2022-02-16 MED ORDER — TIROFIBAN HCL IN NACL 5-0.9 MG/100ML-% IV SOLN
INTRAVENOUS | Status: AC | PRN
  Administered 2022-02-16: .15 ug/kg/min via INTRAVENOUS

## 2022-02-16 MED ORDER — MORPHINE SULFATE (PF) 4 MG/ML IV SOLN
4.0000 mg | Freq: Once | INTRAVENOUS | Status: AC
  Administered 2022-02-16: 4 mg via INTRAVENOUS
  Filled 2022-02-16: qty 1

## 2022-02-16 MED ORDER — MIDAZOLAM HCL 2 MG/2ML IJ SOLN
INTRAMUSCULAR | Status: DC | PRN
  Administered 2022-02-16: .5 mg via INTRAVENOUS

## 2022-02-16 MED ORDER — NITROGLYCERIN IN D5W 200-5 MCG/ML-% IV SOLN
0.0000 ug/min | INTRAVENOUS | Status: DC
  Administered 2022-02-17: 50 ug/min via INTRAVENOUS

## 2022-02-16 MED ORDER — TIZANIDINE HCL 4 MG PO TABS
4.0000 mg | ORAL_TABLET | Freq: Three times a day (TID) | ORAL | Status: DC | PRN
  Administered 2022-02-16 – 2022-02-17 (×2): 4 mg via ORAL
  Filled 2022-02-16 (×3): qty 1

## 2022-02-16 MED ORDER — HYDROCODONE-ACETAMINOPHEN 10-325 MG PO TABS
1.0000 | ORAL_TABLET | Freq: Four times a day (QID) | ORAL | Status: DC | PRN
  Administered 2022-02-16 – 2022-02-18 (×6): 1 via ORAL
  Filled 2022-02-16 (×6): qty 1

## 2022-02-16 MED ORDER — NITROGLYCERIN 1 MG/10 ML FOR IR/CATH LAB
INTRA_ARTERIAL | Status: DC | PRN
  Administered 2022-02-16: 250 ug
  Administered 2022-02-16: 100 ug

## 2022-02-16 MED ORDER — FLUTICASONE FUROATE-VILANTEROL 100-25 MCG/ACT IN AEPB
1.0000 | INHALATION_SPRAY | Freq: Every day | RESPIRATORY_TRACT | Status: DC
Start: 2022-02-16 — End: 2022-02-18
  Administered 2022-02-17: 1 via RESPIRATORY_TRACT
  Filled 2022-02-16: qty 28

## 2022-02-16 MED ORDER — LABETALOL HCL 5 MG/ML IV SOLN: 10.0000 mg | INTRAVENOUS | Status: AC | PRN

## 2022-02-16 MED ORDER — LIDOCAINE HCL (PF) 1 % IJ SOLN
INTRAMUSCULAR | Status: AC
  Filled 2022-02-16: qty 30

## 2022-02-16 MED ORDER — IOHEXOL 350 MG/ML SOLN
100.0000 mL | Freq: Once | INTRAVENOUS | Status: AC | PRN
  Administered 2022-02-16: 100 mL via INTRAVENOUS

## 2022-02-16 MED ORDER — TICAGRELOR 90 MG PO TABS
90.0000 mg | ORAL_TABLET | Freq: Two times a day (BID) | ORAL | Status: DC
  Administered 2022-02-17 – 2022-02-18 (×3): 90 mg via ORAL
  Filled 2022-02-16 (×3): qty 1

## 2022-02-16 MED ORDER — HEPARIN BOLUS VIA INFUSION
4000.0000 [IU] | Freq: Once | INTRAVENOUS | Status: AC
  Administered 2022-02-16: 4000 [IU] via INTRAVENOUS
  Filled 2022-02-16: qty 4000

## 2022-02-16 MED ORDER — HEPARIN SODIUM (PORCINE) 1000 UNIT/ML IJ SOLN
INTRAMUSCULAR | Status: DC | PRN
  Administered 2022-02-16: 5000 [IU] via INTRAVENOUS
  Administered 2022-02-16: 4000 [IU] via INTRAVENOUS

## 2022-02-16 MED ORDER — ATROPINE SULFATE 1 MG/10ML IJ SOSY
PREFILLED_SYRINGE | INTRAMUSCULAR | Status: AC
  Filled 2022-02-16: qty 10

## 2022-02-16 MED ORDER — NITROGLYCERIN 1 MG/10 ML FOR IR/CATH LAB
INTRA_ARTERIAL | Status: AC
  Filled 2022-02-16: qty 10

## 2022-02-16 MED ORDER — DOPAMINE-DEXTROSE 3.2-5 MG/ML-% IV SOLN
INTRAVENOUS | Status: AC
  Filled 2022-02-16: qty 250

## 2022-02-16 MED ORDER — TIROFIBAN HCL IN NACL 5-0.9 MG/100ML-% IV SOLN
INTRAVENOUS | Status: AC
  Filled 2022-02-16: qty 100

## 2022-02-16 MED ORDER — SODIUM CHLORIDE 0.9 % IV SOLN: 250.0000 mL | INTRAVENOUS | Status: DC | PRN

## 2022-02-16 MED ORDER — GABAPENTIN 400 MG PO CAPS
800.0000 mg | ORAL_CAPSULE | Freq: Three times a day (TID) | ORAL | Status: DC
  Administered 2022-02-16 – 2022-02-18 (×6): 800 mg via ORAL
  Filled 2022-02-16 (×6): qty 2

## 2022-02-16 MED ORDER — FENTANYL CITRATE (PF) 100 MCG/2ML IJ SOLN
INTRAMUSCULAR | Status: DC | PRN
Start: 2022-02-16 — End: 2022-02-16
  Administered 2022-02-16: 12.5 ug via INTRAVENOUS

## 2022-02-16 MED ORDER — ALBUTEROL SULFATE HFA 108 (90 BASE) MCG/ACT IN AERS: 2.0000 | INHALATION_SPRAY | RESPIRATORY_TRACT | Status: DC | PRN

## 2022-02-16 MED ORDER — HEPARIN SODIUM (PORCINE) 1000 UNIT/ML IJ SOLN
INTRAMUSCULAR | Status: AC
Start: 2022-02-16 — End: ?
  Filled 2022-02-16: qty 10

## 2022-02-16 MED ORDER — ACETAMINOPHEN 325 MG PO TABS
650.0000 mg | ORAL_TABLET | ORAL | Status: DC | PRN
  Administered 2022-02-16 – 2022-02-18 (×3): 650 mg via ORAL
  Filled 2022-02-16 (×3): qty 2

## 2022-02-16 MED ORDER — FENTANYL CITRATE PF 50 MCG/ML IJ SOSY: 25.0000 ug | PREFILLED_SYRINGE | Freq: Once | INTRAMUSCULAR | Status: DC

## 2022-02-16 MED ORDER — UMECLIDINIUM BROMIDE 62.5 MCG/ACT IN AEPB
1.0000 | INHALATION_SPRAY | Freq: Every day | RESPIRATORY_TRACT | Status: DC
  Administered 2022-02-17: 1 via RESPIRATORY_TRACT
  Filled 2022-02-16: qty 7

## 2022-02-16 MED ORDER — MORPHINE SULFATE (PF) 2 MG/ML IV SOLN: 2.0000 mg | Freq: Once | INTRAVENOUS | Status: DC

## 2022-02-16 MED ORDER — TIROFIBAN HCL IN NACL 5-0.9 MG/100ML-% IV SOLN
0.1500 ug/kg/min | INTRAVENOUS | Status: AC
  Administered 2022-02-16 – 2022-02-17 (×3): 0.15 ug/kg/min via INTRAVENOUS
  Filled 2022-02-16 (×3): qty 100

## 2022-02-16 MED ORDER — ATROPINE SULFATE 1 MG/10ML IJ SOSY
PREFILLED_SYRINGE | INTRAMUSCULAR | Status: DC | PRN
  Administered 2022-02-16: .5 mg via INTRAVENOUS

## 2022-02-16 MED ORDER — FENTANYL CITRATE (PF) 100 MCG/2ML IJ SOLN
INTRAMUSCULAR | Status: AC
  Filled 2022-02-16: qty 2

## 2022-02-16 MED ORDER — LIDOCAINE HCL (PF) 1 % IJ SOLN
INTRAMUSCULAR | Status: DC | PRN
  Administered 2022-02-16: 2 mL

## 2022-02-16 MED ORDER — HEPARIN (PORCINE) IN NACL 1000-0.9 UT/500ML-% IV SOLN
INTRAVENOUS | Status: AC
  Filled 2022-02-16: qty 1000

## 2022-02-16 MED ORDER — TICAGRELOR 90 MG PO TABS
ORAL_TABLET | ORAL | Status: AC
  Filled 2022-02-16: qty 2

## 2022-02-16 SURGICAL SUPPLY — 27 items
BALL SAPPHIRE NC24 4.0X8 (BALLOONS) ×2
BALLN SAPPHIRE 2.5X15 (BALLOONS) ×2
BALLOON SAPPHIRE 2.5X15 (BALLOONS) IMPLANT
BALLOON SAPPHIRE NC24 4.0X8 (BALLOONS) IMPLANT
CATH EXTRAC PRONTO LP 6F RND (CATHETERS) ×1 IMPLANT
CATH LAUNCHER 6FR EBU3.5 (CATHETERS) ×1 IMPLANT
CATH OPTITORQUE TIG 4.0 5F (CATHETERS) ×1 IMPLANT
CATH VENTURE OTW 6F (MICROCATHETER) ×1 IMPLANT
DEVICE RAD COMP TR BAND LRG (VASCULAR PRODUCTS) ×1 IMPLANT
GLIDESHEATH SLEND A-KIT 6F 22G (SHEATH) ×2 IMPLANT
GUIDEWIRE INQWIRE 1.5J.035X260 (WIRE) IMPLANT
INQWIRE 1.5J .035X260CM (WIRE) ×2
KIT ENCORE 26 ADVANTAGE (KITS) ×1 IMPLANT
KIT HEART LEFT (KITS) ×2 IMPLANT
KIT HEMO VALVE WATCHDOG (MISCELLANEOUS) ×1 IMPLANT
PACK CARDIAC CATHETERIZATION (CUSTOM PROCEDURE TRAY) ×2 IMPLANT
SHEATH PROBE COVER 6X72 (BAG) ×1 IMPLANT
STENT SYNERGY XD 4.0X16 (Permanent Stent) IMPLANT
SYNERGY XD 4.0X16 (Permanent Stent) ×2 IMPLANT
SYR MEDRAD MARK 7 150ML (SYRINGE) ×2 IMPLANT
TRANSDUCER W/STOPCOCK (MISCELLANEOUS) ×2 IMPLANT
TUBING CIL FLEX 10 FLL-RA (TUBING) ×2 IMPLANT
WIRE ASAHI FIELDER XT 190CM (WIRE) ×1 IMPLANT
WIRE ASAHI PROWATER 180CM (WIRE) ×1 IMPLANT
WIRE ASAHI PROWATER 300CM (WIRE) ×1 IMPLANT
WIRE GUIDE ASAHI EXTENSION 165 (WIRE) ×1 IMPLANT
WIRE HI TORQ WHISPER MS 190CM (WIRE) ×1 IMPLANT

## 2022-02-16 NOTE — Interval H&P Note (Signed)
History and Physical Interval Note: ? ?02/16/2022 ?2:02 PM ? ?Evan Brock  has presented today for surgery, with the diagnosis of nstemi.  The various methods of treatment have been discussed with the patient and family. After consideration of risks, benefits and other options for treatment, the patient has consented to  Procedure(s): ?LEFT HEART CATH AND CORONARY ANGIOGRAPHY (N/A) as a surgical intervention.  The patient's history has been reviewed, patient examined, no change in status, stable for surgery.  I have reviewed the patient's chart and labs.  Questions were answered to the patient's satisfaction.   ? ?2016 Appropriate Use Criteria for Coronary Revascularization in Patients With Acute Coronary Syndrome ?NSTEMI/UA ?High Risk (TIMI Score 5-7) ?NSTEMI/Unstable angina, stabilized patient at high risk ?Link Here: https://powell.info/ ?Indication: ? ?Revascularization by PCI or CABG of 1 or more arteries in a patient with NSTEMI or unstable angina with ?Stabilization after presentation ?High risk for clinical events ?A (7) Indication: 16; Score 7 ? ? ?[name] J Nera [place] ? ? ?

## 2022-02-16 NOTE — H&P (Signed)
HISTORY AND PHYSICAL  ?Patient ID: ?Evan Brock ?MRN: [id number] ?DOB/AGE: [birth year] 71 y.o. ? ?Admit date: 02/16/2022 ?Attending physician: Rex Kras, DO, [facility] ?Primary Physician:  Sherald Hess., MD ? ?Chief complaint: Chest pain ? ?HPI:  ?Evan Brock is a 71 y.o. male who presents with a chief complaint of " chest pain." His past medical history and cardiovascular risk factors include: COPD, hypertension, diabetes mellitus type 2, hyperlipidemia, coronary artery calcification, advanced age, obesity due to excess calories. ? ?Patient presents to the hospital due to precordial discomfort which started at 4:30 AM which woke him up from sleep.  The discomfort is substernally located, pressure-like sensation, 10 out of 10 initially, currently 9 out of 10, nonradiating, unable to comment if his exertional since it began this morning.  Continues to moan in discomfort. ? ?Denies orthopnea, paroxysmal nocturnal dyspnea or lower extremity swelling. ? ?Patient's wife present at bedside. ? ?ALLERGIES: ?No Known Allergies ? ?PAST MEDICAL HISTORY: ?Past Medical History:  ?Diagnosis Date  ? Arthritis   ? "hips" (10/01/2013)  ? Asthma   ? COPD (chronic obstructive pulmonary disease) ([place])   ? Exertional shortness of breath   ? Hypertension   ? ? ?PAST SURGICAL HISTORY: ?Past Surgical History:  ?Procedure Laterality Date  ? JOINT REPLACEMENT    ? TOTAL HIP ARTHROPLASTY Right 2010  ? TOTAL HIP ARTHROPLASTY Left 08/19/2013  ? Procedure: TOTAL HIP ARTHROPLASTY ANTERIOR APPROACH;  Surgeon: Hessie Dibble, MD;  Location: [facility];  Service: Orthopedics;  Laterality: Left;  ? ? ?FAMILY HISTORY: ?The patient family history includes Diabetes in his mother; Hypertension in his father; Lupus in his sister. ?  ?SOCIAL HISTORY:  ?The patient  reports that he quit smoking about 31 years ago. His smoking use included cigarettes. He has never used smokeless tobacco. He reports current alcohol use of about 3.0 standard drinks per week. He  reports current drug use. Drug: Marijuana. ? ?MEDICATIONS: ?Current Outpatient Medications  ?Medication Instructions  ? albuterol (PROVENTIL) 2.5 mg, Nebulization, Every 4 hours PRN  ? albuterol (VENTOLIN HFA) 108 (90 Base) MCG/ACT inhaler INHALE 1 PUFF FOUR TIMES DAILY, AS NEEDED  ? Blood Pressure Monitor KIT 1 kit, Does not apply, 3 times daily PRN  ? Budeson-Glycopyrrol-Formoterol (BREZTRI AEROSPHERE) 160-9-4.8 MCG/ACT AERO 2 puffs, Inhalation, 2 times daily  ? cefdinir (OMNICEF) 300 mg, Oral, 2 times daily  ? chlorthalidone (HYGROTON) 25 mg, Oral, Daily  ? gabapentin (NEURONTIN) 800 mg, Oral, 3 times daily  ? HYDROcodone-acetaminophen (NORCO) 10-325 MG tablet 1 tablet, Oral, Every 6 hours PRN  ? losartan (COZAAR) 50 mg, Oral, Daily  ? metFORMIN (GLUCOPHAGE) 500 mg, Oral, 2 times daily with meals  ? sildenafil (VIAGRA) 25 mg, Oral, Daily PRN  ? tamsulosin (FLOMAX) 0.8 mg, Oral, Daily after supper  ? tiZANidine (ZANAFLEX) 4 mg, Oral, Every 8 hours PRN  ? TRELEGY ELLIPTA 100-62.5-25 MCG/ACT AEPB 1 puff, Oral, Daily  ? ? ? heparin 1,200 Units/hr (02/16/22 1306)  ? ? ?REVIEW OF SYSTEMS: ?Review of Systems  ?Constitutional: Negative for chills and fever.  ?HENT:  Negative for hoarse voice and nosebleeds.   ?Eyes:  Negative for discharge, double vision and pain.  ?Cardiovascular:  Positive for chest pain. Negative for claudication, dyspnea on exertion, leg swelling, near-syncope, orthopnea, palpitations, paroxysmal nocturnal dyspnea and syncope.  ?Respiratory:  Negative for hemoptysis and shortness of breath.   ?Musculoskeletal:  Negative for muscle cramps and myalgias.  ?Gastrointestinal:  Negative for abdominal pain, constipation, diarrhea, hematemesis, hematochezia, melena, nausea  and vomiting.  ?Neurological:  Negative for dizziness and light-headedness.  ?All other systems reviewed and are negative. ? ?PHYSICAL EXAM: ? ?  02/16/2022  ?  1:09 PM 02/16/2022  ? 11:45 AM 02/16/2022  ? 11:30 AM  ?Vitals with BMI   ?Systolic 98 465 681  ?Diastolic 59 80 76  ?Pulse 67 78 79  ? ? ?No intake or output data in the 24 hours ending 02/16/22 1332  ?Net IO Since Admission: No IO data has been entered for this period [02/16/22 1332] ?CONSTITUTIONAL: Age-appropriate male, appears to be in discomfort, hemodynamically stable.   ?SKIN: Skin is warm and dry. No rash noted. No cyanosis. No pallor. No jaundice ?HEAD: Normocephalic and atraumatic.  ?EYES: No scleral icterus ?MOUTH/THROAT: Dry oral membranes.  ?NECK: No JVD present. No thyromegaly noted. No carotid bruits  ?LYMPHATIC: No visible cervical adenopathy.  ?CHEST Normal respiratory effort. No intercostal retractions  ?LUNGS: Decreased breath sounds bilaterally no stridor. No wheezes. No rales.  ?CARDIOVASCULAR: Regular, positive S1-S2, no murmurs rubs or gallops appreciated. ?ABDOMINAL: Obese, soft, nontender, nondistended, positive bowel sounds in all 4 quadrants, no apparent ascites.  ?EXTREMITIES: No peripheral edema, warm to touch, palpable DP and PT pulses bilaterally. ?HEMATOLOGIC: No significant bruising ?NEUROLOGIC: Oriented to person, place, and time. Nonfocal. Normal muscle tone.  ?PSYCHIATRIC: Normal mood and affect. Normal behavior. Cooperative ? ?RADIOLOGY: ?DG Chest 2 View ? ?Result Date: 02/16/2022 ?CLINICAL DATA:  Chest pain. EXAM: CHEST - 2 VIEW COMPARISON:  November 12, 2021. FINDINGS: Stable cardiomediastinal silhouette. Minimal bibasilar subsegmental atelectasis is noted. Bony thorax is unremarkable. IMPRESSION: Minimal bibasilar subsegmental atelectasis. Electronically Signed   By: Marijo Conception M.D.   On: 02/16/2022 09:16  ? ?CT Angio Chest PE W and/or Wo Contrast ? ?Result Date: 02/16/2022 ?CLINICAL DATA:  Chest pain.  Elevated D-dimer. EXAM: CT ANGIOGRAPHY CHEST WITH CONTRAST TECHNIQUE: Multidetector CT imaging of the chest was performed using the standard protocol during bolus administration of intravenous contrast. Multiplanar CT image reconstructions and  MIPs were obtained to evaluate the vascular anatomy. RADIATION DOSE REDUCTION: This exam was performed according to the departmental dose-optimization program which includes automated exposure control, adjustment of the mA and/or kV according to patient size and/or use of iterative reconstruction technique. CONTRAST:  182m OMNIPAQUE IOHEXOL 350 MG/ML SOLN COMPARISON:  CT scan 11/12/2021 FINDINGS: Cardiovascular: The heart is normal in size. No pericardial effusion. The aorta is normal in caliber. Stable scattered atherosclerotic calcifications. Stable calcifications near the aortic valve. Stable scattered coronary artery calcifications. The pulmonary arterial tree is well opacified. No filling defects to suggest pulmonary embolism. Mediastinum/Nodes: Stable borderline mediastinal and hilar lymph nodes. No mass or overt adenopathy the. The esophagus is grossly normal. Lungs/Pleura: Stable lower lobe bronchiectasis and tree-in-bud type nodularity suggesting chronic inflammation or possibly atypical infection such as MAC. No focal pulmonary infiltrates. No pleural effusions. No worrisome pulmonary lesions. Upper Abdomen: No significant upper abdominal findings. Musculoskeletal: No chest wall mass, or supraclavicular or axillary adenopathy. The bony thorax is intact. Review of the MIP images confirms the above findings. IMPRESSION: 1. No CT findings for pulmonary embolism. 2. Stable scattered atherosclerotic calcifications involving the thoracic aorta and coronary arteries. 3. Stable lower lobe bronchiectasis and tree-in-bud type nodularity suggesting chronic inflammation or possibly atypical infection such as MAC. 4. Stable borderline mediastinal and hilar lymph nodes, likely related to the lung findings. Aortic Atherosclerosis (ICD10-I70.0). Electronically Signed   By: PMarijo SanesM.D.   On: 02/16/2022 12:23   ? ?LABORATORY DATA: ?  Lab Results  ?Component Value Date  ? WBC 7.9 02/16/2022  ? HGB 12.9 (L) 02/16/2022   ? HCT 42.3 02/16/2022  ? MCV 83.4 02/16/2022  ? PLT 326 02/16/2022  ?  ?Recent Labs  ?Lab 02/16/22 ?0847  ?NA 142  ?K 4.0  ?CL 100  ?CO2 37*  ?BUN 13  ?CREATININE 1.10  ?CALCIUM 9.2  ?GLUCOSE 156*  ? ? ?Lipid P

## 2022-02-16 NOTE — Progress Notes (Signed)
Aspiration thrombectomy and PCI

## 2022-02-16 NOTE — Progress Notes (Addendum)
ANTICOAGULATION CONSULT NOTE - Initial Consult ? ?Pharmacy Consult for Aggrastat ?Indication:  post cath with DES ? ?No Known Allergies ? ?Patient Measurements: ?Height: 5\' 9"  (175.3 cm) ?Weight: 95.3 kg (210 lb) ?IBW/kg (Calculated) : 70.7 ? ?Vital Signs: ?Temp: 97.6 ?F (36.4 ?C) (04/27 [id number]) ?Temp Source: Axillary (04/27 [id number]) ?BP: 98/59 (04/27 1309) ?Pulse Rate: 67 (04/27 1309) ? ?Labs: ?Recent Labs  ?  02/16/22 ?[id number] 02/16/22 ?1053  ?HGB 12.9*  --   ?HCT 42.3  --   ?PLT 326  --   ?CREATININE 1.10  --   ?TROPONINIHS 18* 158*  ? ? ?Estimated Creatinine Clearance: 70.1 mL/min (by C-G formula based on SCr of 1.1 mg/dL). ? ? ?Medical History: ?Past Medical History:  ?Diagnosis Date  ? Arthritis   ? "hips" (10/01/2013)  ? Asthma   ? COPD (chronic obstructive pulmonary disease) ([place])   ? Exertional shortness of breath   ? Hypertension   ? ? ?Assessment: ?71 years of age male now post cardiac cath found to have large vessel Lcx with proximal occlusion and organized heavy thrombus s/p aspiration thrombectomy, DES placement. Plan for aggrastat + nitro drip overnight. Pharmacy consulted for aggrastat x18 hrs.  ?  ?Goal of Therapy:  ?Monitor platelets by anticoagulation protocol: Yes ?  ?Plan:  ?Continue Aggrastat at 0.15 mcg/kg/min x18 hours.  ?Monitor platelets ? ?Sloan Leiter, PharmD, BCPS, BCCCP ?Clinical Pharmacist ?Please refer to [facility] for [place] numbers ?02/16/2022,5:53 PM ? ? ?Addendum:  ?Platelet check at 2200 PM - remain within normal limits at 299. CBC stable.  ? ? ? ? ?

## 2022-02-16 NOTE — ED Provider Notes (Signed)
?[facility] ?Provider Note ? ? ?CSN: [id number] ?Arrival date & time: 02/16/22  0827 ? ?  ? ?History ?Chief Complaint  ?Patient presents with  ? Chest Pain  ? ? ?Evan Brock is a 71 y.o. male with history of COPD, HTN, HLD, and DM who presents to the emergency department today with substernal chest tightness that started around 4 AM this morning.  This woke him up from sleep.  He denies any similar symptoms in the past.  Chest tightness has been constant since onset and is nonradiating.  Nothing seems to make it better.  Patient was given 325 aspirin and 1 nitroglycerin prior to arrival with no change on his chest tightness.  Patient also took Alka-Seltzer thinking it was GERD which made his pain worse.  He denies shortness of breath, nausea, vomiting, diarrhea, diaphoresis, syncope. ? ? ?Chest Pain ? ?  ? ?Home Medications ?Prior to Admission medications   ?Medication Sig Start Date End Date Taking? Authorizing Provider  ?albuterol (PROVENTIL) (2.5 MG/3ML) 0.083% nebulizer solution Take 3 mLs (2.5 mg total) by nebulization every 4 (four) hours as needed for wheezing or shortness of breath. 11/14/21  Yes Patrecia Pour, MD  ?albuterol (VENTOLIN HFA) 108 (90 Base) MCG/ACT inhaler INHALE 1 PUFF FOUR TIMES DAILY, AS NEEDED ?Patient taking differently: 2 puffs every 4 (four) hours as needed for wheezing or shortness of breath. 10/26/21 10/26/22 Yes Raspet, Erin K, PA-C  ?chlorthalidone (HYGROTON) 25 MG tablet Take 25 mg by mouth daily.   Yes [provider]  ?HYDROcodone-acetaminophen (NORCO) 10-325 MG tablet Take 1 tablet by mouth every 6 (six) hours as needed for moderate pain or severe pain. 02/14/21  Yes [provider]  ?losartan (COZAAR) 50 MG tablet Take 1 tablet (50 mg total) by mouth daily. 11/05/19  Yes Kerin Perna, NP  ?metFORMIN (GLUCOPHAGE) 500 MG tablet Take 1 tablet (500 mg total) by mouth 2 (two) times daily with a meal. 11/05/19  Yes Kerin Perna, NP  ?sildenafil (VIAGRA) 50 MG tablet Take 25 mg by mouth daily as needed for erectile dysfunction. 01/23/22  Yes [provider]  ?tamsulosin (FLOMAX) 0.4 MG CAPS capsule Take 0.8 mg by mouth daily after supper. 11/09/21  Yes [provider]  ?tiZANidine (ZANAFLEX) 4 MG tablet Take 4 mg by mouth every 8 (eight) hours as needed for muscle spasms.   Yes [provider]  ?Donnal Debar 100-62.5-25 MCG/ACT AEPB Take 1 puff by mouth daily. 12/21/21  Yes [provider]  ?Blood Pressure Monitor KIT 1 kit by Does not apply route 3 (three) times daily as needed. 11/05/19   Kerin Perna, NP  ?Budeson-Glycopyrrol-Formoterol (BREZTRI AEROSPHERE) 160-9-4.8 MCG/ACT AERO Inhale 2 puffs into the lungs in the morning and at bedtime. ?Patient not taking: Reported on 02/16/2022 05/19/20   Collene Gobble, MD  ?cefdinir (OMNICEF) 300 MG capsule Take 1 capsule (300 mg total) by mouth 2 (two) times daily. ?Patient not taking: Reported on 02/16/2022 11/14/21   Patrecia Pour, MD  ?gabapentin (NEURONTIN) 800 MG tablet Take 800 mg by mouth 3 (three) times daily. 02/01/22   [provider]  ?   ? ?Allergies    ?Patient has no known allergies.   ? ?Review of Systems   ?Review of Systems  ?Cardiovascular:  Positive for chest pain.  ?All other systems reviewed and are negative. ? ?Physical Exam ?Updated Vital Signs ?BP (!) 98/59   Pulse 67   Temp  97.6 ?F (36.4 ?C) (Axillary)   Resp (!) 21   Ht _0  (1.753 m)   Wt 95.3 kg   SpO2 96%   BMI 31.01 kg/m?  ?Physical Exam ?Vitals and nursing note reviewed.  ?Constitutional:   ?   General: He is not in acute distress. ?   Appearance: Normal appearance.  ?HENT:  ?   Head: Normocephalic and atraumatic.  ?Eyes:  ?   General:     ?   Right eye: No discharge.     ?   Left eye: No discharge.  ?Cardiovascular:  ?   Comments: Regular rate and rhythm.  S1/S2 are distinct without any evidence of murmur, rubs, or gallops.  Radial pulses are 2+  bilaterally.  Dorsalis pedis pulses are 2+ bilaterally.  No evidence of pedal edema. ?Pulmonary:  ?   Comments: Clear to auscultation bilaterally.  Normal effort.  No respiratory distress.  No evidence of wheezes, rales, or rhonchi heard throughout. ?Chest:  ?   Comments: Chest pain nonreproducible with palpation of the chest wall. ?Abdominal:  ?   General: Abdomen is flat. Bowel sounds are normal. There is no distension.  ?   Tenderness: There is no abdominal tenderness. There is no guarding or rebound.  ?Musculoskeletal:     ?   General: Normal range of motion.  ?   Cervical back: Neck supple.  ?Skin: ?   General: Skin is warm and dry.  ?   Findings: No rash.  ?Neurological:  ?   General: No focal deficit present.  ?   Mental Status: He is alert.  ?Psychiatric:     ?   Mood and Affect: Mood normal.     ?   Behavior: Behavior normal.  ? ? ?ED Results / Procedures / Treatments   ?Labs ?(all labs ordered are listed, but only abnormal results are displayed) ?Labs Reviewed  ?BASIC METABOLIC PANEL - Abnormal; Notable for the following components:  ?    Result Value  ? CO2 37 (*)   ? Glucose, Bld 156 (*)   ? All other components within normal limits  ?CBC - Abnormal; Notable for the following components:  ? Hemoglobin 12.9 (*)   ? MCH 25.4 (*)   ? All other components within normal limits  ?D-DIMER, QUANTITATIVE - Abnormal; Notable for the following components:  ? D-Dimer, Quant 1.27 (*)   ? All other components within normal limits  ?TROPONIN I (HIGH SENSITIVITY) - Abnormal; Notable for the following components:  ? Troponin I (High Sensitivity) 18 (*)   ? All other components within normal limits  ?TROPONIN I (HIGH SENSITIVITY) - Abnormal; Notable for the following components:  ? Troponin I (High Sensitivity) 158 (*)   ? All other components within normal limits  ?BRAIN NATRIURETIC PEPTIDE  ?HEPARIN LEVEL (UNFRACTIONATED)  ?HEMOGLOBIN A1C  ? ? ?EKG ?EKG Interpretation ? ?Date/Time:  Thursday February 16 2022 13:08:03  EDT ?Ventricular Rate:  66 ?PR Interval:  180 ?QRS Duration: 90 ?QT Interval:  419 ?QTC Calculation: 439 ?R Axis:   65 ?Text Interpretation: Sinus rhythm Multiple ventricular premature complexes Minimal ST elevation, anterior leads No significant change since last tracing Confirmed by Aletta Edouard [phone]) on 02/16/2022 1:14:43 PM ? ?Radiology ?DG Chest 2 View ? ?Result Date: 02/16/2022 ?CLINICAL DATA:  Chest pain. EXAM: CHEST - 2 VIEW COMPARISON:  November 12, 2021. FINDINGS: Stable cardiomediastinal silhouette. Minimal bibasilar subsegmental atelectasis is noted. Bony thorax is unremarkable. IMPRESSION: Minimal bibasilar subsegmental atelectasis. Electronically Signed  By: Marijo Conception M.D.   On: 02/16/2022 09:16  ? ?CT Angio Chest PE W and/or Wo Contrast ? ?Result Date: 02/16/2022 ?CLINICAL DATA:  Chest pain.  Elevated D-dimer. EXAM: CT ANGIOGRAPHY CHEST WITH CONTRAST TECHNIQUE: Multidetector CT imaging of the chest was performed using the standard protocol during bolus administration of intravenous contrast. Multiplanar CT image reconstructions and MIPs were obtained to evaluate the vascular anatomy. RADIATION DOSE REDUCTION: This exam was performed according to the departmental dose-optimization program which includes automated exposure control, adjustment of the mA and/or kV according to patient size and/or use of iterative reconstruction technique. CONTRAST:  114m OMNIPAQUE IOHEXOL 350 MG/ML SOLN COMPARISON:  CT scan 11/12/2021 FINDINGS: Cardiovascular: The heart is normal in size. No pericardial effusion. The aorta is normal in caliber. Stable scattered atherosclerotic calcifications. Stable calcifications near the aortic valve. Stable scattered coronary artery calcifications. The pulmonary arterial tree is well opacified. No filling defects to suggest pulmonary embolism. Mediastinum/Nodes: Stable borderline mediastinal and hilar lymph nodes. No mass or overt adenopathy the. The esophagus is grossly  normal. Lungs/Pleura: Stable lower lobe bronchiectasis and tree-in-bud type nodularity suggesting chronic inflammation or possibly atypical infection such as MAC. No focal pulmonary infiltrates. No pleural effusions. No worrisom

## 2022-02-16 NOTE — ED Triage Notes (Signed)
Pt to ER via EMS form home with c/o midsternal chest pain that started around 4AM.  Per EMS pt was 90% on RA and was placed on NRB.  Pt was given 324mg  ASA and 1 SL NTG.  After NTG pt became diaphoretic and lethargic.  Pt has no cardiac hx per EMS. ?Pt alert and oriented on arrival to ER.  States no change in pain since medications. ?

## 2022-02-16 NOTE — Progress Notes (Signed)
Documentation:  ? ?Patient ruled in for NSTEMI w/ on going pain was taken to cath lab urgently and underwent aspiration thrombectomy and PCI to prox LCX.  ? ?RN notified of critical troponin (old order from earlier this morning).  ? ?He is asymptomatic and resting comfortably.  ? ?Recommended to stop checking hstroponin routinely unless there is change in clinical status and if any change to call me back.  ? ?Tessa Lerner, DO, FACC ? ?Pager: [phone] ?Office: [phone] ?11:29 PM ?02/16/22 ? ? ?

## 2022-02-16 NOTE — Consult Note (Signed)
See H& P.

## 2022-02-16 NOTE — Progress Notes (Signed)
ANTICOAGULATION CONSULT NOTE - Initial Consult ? ?Pharmacy Consult for heparin ?Indication: chest pain/ACS ? ?No Known Allergies ? ?Patient Measurements: ?Height: 5\' 9"  (175.3 cm) ?Weight: 95.3 kg (210 lb) ?IBW/kg (Calculated) : 70.7 ?Heparin Dosing Weight: 90kg ? ?Vital Signs: ?Temp: 97.6 ?F (36.4 ?C) (04/27 [id number]) ?Temp Source: Axillary (04/27 [id number]) ?BP: 133/80 (04/27 1145) ?Pulse Rate: 78 (04/27 1145) ? ?Labs: ?Recent Labs  ?  02/16/22 ?[id number] 02/16/22 ?1053  ?HGB 12.9*  --   ?HCT 42.3  --   ?PLT 326  --   ?CREATININE 1.10  --   ?TROPONINIHS 18* 158*  ? ? ?Estimated Creatinine Clearance: 70.1 mL/min (by C-G formula based on SCr of 1.1 mg/dL). ? ? ?Medical History: ?Past Medical History:  ?Diagnosis Date  ? Arthritis   ? "hips" (10/01/2013)  ? Asthma   ? COPD (chronic obstructive pulmonary disease) ([place])   ? Exertional shortness of breath   ? Hypertension   ? ? ?Assessment: ?53 YOM presenting with CP, diaphoretic, increased troponin.  He is not on anticoagulation PTA ? ?Goal of Therapy:  ?Heparin level 0.3-0.7 units/ml ?Monitor platelets by anticoagulation protocol: Yes ?  ?Plan:  ?Heparin 4000 units IV x 1, and gtt at 1200 units/hr ?F/u 6 hour heparin level ?F/u cards eval and recs ? ?Bertis Ruddy, PharmD ?Clinical Pharmacist ?ED Pharmacist Phone # [phone] ?02/16/2022 12:33 PM ? ? ? ?

## 2022-02-17 ENCOUNTER — Encounter (HOSPITAL_COMMUNITY): Payer: Self-pay | Admitting: Cardiology

## 2022-02-17 ENCOUNTER — Other Ambulatory Visit (HOSPITAL_COMMUNITY): Payer: Self-pay

## 2022-02-17 ENCOUNTER — Telehealth: Payer: Self-pay | Admitting: Cardiology

## 2022-02-17 ENCOUNTER — Inpatient Hospital Stay (HOSPITAL_COMMUNITY): Payer: Medicare HMO

## 2022-02-17 LAB — BASIC METABOLIC PANEL WITH GFR
Anion gap: 8 (ref 5–15)
BUN: 11 mg/dL (ref 8–23)
CO2: 29 mmol/L (ref 22–32)
Calcium: 8.8 mg/dL — ABNORMAL LOW (ref 8.9–10.3)
Chloride: 100 mmol/L (ref 98–111)
Creatinine, Ser: 1.01 mg/dL (ref 0.61–1.24)
GFR, Estimated: >60 mL/min (ref >60)
Glucose, Bld: 113 mg/dL — ABNORMAL HIGH (ref 70–99)
Potassium: 3.3 mmol/L — ABNORMAL LOW (ref 3.5–5.1)
Sodium: 137 mmol/L (ref 135–145)

## 2022-02-17 LAB — TSH: TSH: 0.522 u[IU]/mL (ref 0.350–4.500)

## 2022-02-17 LAB — LIPID PANEL
Cholesterol: 171 mg/dL (ref 0–200)
HDL: 41 mg/dL (ref >40)
LDL Cholesterol: 111 mg/dL — ABNORMAL HIGH (ref 0–99)
Total CHOL/HDL Ratio: 4.2 RATIO
Triglycerides: 95 mg/dL (ref <150)
VLDL: 19 mg/dL (ref 0–40)

## 2022-02-17 LAB — CBC
HCT: 41 % (ref 39.0–52.0)
Hemoglobin: 12.8 g/dL — ABNORMAL LOW (ref 13.0–17.0)
MCH: 26 pg (ref 26.0–34.0)
MCHC: 31.2 g/dL (ref 30.0–36.0)
MCV: 83.2 fL (ref 80.0–100.0)
Platelets: 323 10*3/uL (ref 150–400)
RBC: 4.93 MIL/uL (ref 4.22–5.81)
RDW: 14.6 % (ref 11.5–15.5)
WBC: 10.4 10*3/uL (ref 4.0–10.5)
nRBC: 0 % (ref 0.0–0.2)

## 2022-02-17 LAB — HEMOGLOBIN A1C
Hgb A1c MFr Bld: 6.5 % — ABNORMAL HIGH (ref 4.8–5.6)
Mean Plasma Glucose: 139.85 mg/dL

## 2022-02-17 LAB — LDL CHOLESTEROL, DIRECT: Direct LDL: 102.3 mg/dL — ABNORMAL HIGH (ref 0–99)

## 2022-02-17 MED ORDER — METOPROLOL TARTRATE 25 MG PO TABS
25.0000 mg | ORAL_TABLET | Freq: Two times a day (BID) | ORAL | Status: DC
  Administered 2022-02-17 – 2022-02-18 (×3): 25 mg via ORAL
  Filled 2022-02-17 (×3): qty 1

## 2022-02-17 MED ORDER — ROSUVASTATIN CALCIUM 20 MG PO TABS
40.0000 mg | ORAL_TABLET | Freq: Every day | ORAL | Status: DC
  Administered 2022-02-17: 40 mg via ORAL
  Filled 2022-02-17: qty 2

## 2022-02-17 MED ORDER — METOPROLOL TARTRATE 25 MG PO TABS
25.0000 mg | ORAL_TABLET | Freq: Two times a day (BID) | ORAL | 1 refills | Status: DC
  Filled 2022-02-17: qty 60, 30d supply, fill #0

## 2022-02-17 MED ORDER — LOSARTAN POTASSIUM 50 MG PO TABS
100.0000 mg | ORAL_TABLET | Freq: Every day | ORAL | Status: DC
  Administered 2022-02-18: 100 mg via ORAL
  Filled 2022-02-17: qty 2

## 2022-02-17 MED ORDER — POTASSIUM CHLORIDE CRYS ER 20 MEQ PO TBCR
40.0000 meq | EXTENDED_RELEASE_TABLET | Freq: Once | ORAL | Status: AC
  Administered 2022-02-17: 40 meq via ORAL
  Filled 2022-02-17: qty 2

## 2022-02-17 MED ORDER — CHLORHEXIDINE GLUCONATE CLOTH 2 % EX PADS
6.0000 | MEDICATED_PAD | Freq: Every day | CUTANEOUS | Status: DC
  Administered 2022-02-17 – 2022-02-18 (×2): 6 via TOPICAL

## 2022-02-17 MED ORDER — LOSARTAN POTASSIUM 50 MG PO TABS
50.0000 mg | ORAL_TABLET | Freq: Every day | ORAL | Status: DC
  Administered 2022-02-17: 50 mg via ORAL
  Filled 2022-02-17: qty 1

## 2022-02-17 MED ORDER — SPIRONOLACTONE 12.5 MG HALF TABLET
12.5000 mg | ORAL_TABLET | Freq: Every day | ORAL | Status: DC
  Administered 2022-02-17: 12.5 mg via ORAL
  Filled 2022-02-17: qty 1

## 2022-02-17 MED ORDER — ROSUVASTATIN CALCIUM 40 MG PO TABS
40.0000 mg | ORAL_TABLET | Freq: Every day | ORAL | 1 refills | Status: DC
  Filled 2022-02-17: qty 30, 30d supply, fill #0

## 2022-02-17 MED ORDER — EMPAGLIFLOZIN 10 MG PO TABS
10.0000 mg | ORAL_TABLET | Freq: Every day | ORAL | Status: DC
  Administered 2022-02-17 – 2022-02-18 (×2): 10 mg via ORAL
  Filled 2022-02-17 (×2): qty 1

## 2022-02-17 MED ORDER — METFORMIN HCL 500 MG PO TABS
500.0000 mg | ORAL_TABLET | Freq: Two times a day (BID) | ORAL | 1 refills | Status: AC
  Filled 2022-02-17: qty 180, 90d supply, fill #0

## 2022-02-17 MED ORDER — LOSARTAN POTASSIUM 50 MG PO TABS
50.0000 mg | ORAL_TABLET | Freq: Once | ORAL | Status: AC
  Administered 2022-02-17: 50 mg via ORAL
  Filled 2022-02-17: qty 1

## 2022-02-17 MED ORDER — NITROGLYCERIN 0.4 MG SL SUBL
0.4000 mg | SUBLINGUAL_TABLET | SUBLINGUAL | 1 refills | Status: DC | PRN
  Filled 2022-02-17: qty 25, 14d supply, fill #0

## 2022-02-17 MED ORDER — DAPAGLIFLOZIN PROPANEDIOL 10 MG PO TABS
10.0000 mg | ORAL_TABLET | Freq: Every day | ORAL | 1 refills | Status: DC
  Filled 2022-02-17: qty 30, 30d supply, fill #0

## 2022-02-17 MED ORDER — EMPAGLIFLOZIN 10 MG PO TABS
10.0000 mg | ORAL_TABLET | Freq: Every day | ORAL | 1 refills | Status: DC
  Filled 2022-02-17: qty 30, 30d supply, fill #0
  Filled 2022-02-17: qty 14, 14d supply, fill #0

## 2022-02-17 MED ORDER — ASPIRIN 81 MG PO TBEC
81.0000 mg | DELAYED_RELEASE_TABLET | Freq: Every day | ORAL | 1 refills | Status: DC
  Filled 2022-02-17: qty 30, 30d supply, fill #0

## 2022-02-17 MED ORDER — TICAGRELOR 90 MG PO TABS
90.0000 mg | ORAL_TABLET | Freq: Two times a day (BID) | ORAL | 1 refills | Status: DC
  Filled 2022-02-17: qty 60, 30d supply, fill #0

## 2022-02-17 NOTE — Progress Notes (Signed)
Subjective:  ?Still has chest pain.  While he rates it 7/10, he states that its better and much less in intensity compared to his presentation with MI yesterday.  Pain got better with his pain medication Norco this morning. ? ?Objective:  ?Vital Signs in the last 24 hours: ?Temp:  [97.6 ?F (36.4 ?C)-98.2 ?F (36.8 ?C)] 97.6 ?F (36.4 ?C) (04/28 0756) ?Pulse Rate:  [0-104] 71 (04/28 1030) ?Resp:  [15-28] 23 (04/28 1030) ?BP: (98-161)/(59-116) 129/80 (04/28 1030) ?SpO2:  [90 %-100 %] 98 % (04/28 1030) ? ?Intake/Output from previous day: ?04/27 0701 - 04/28 0700 ?In: 488.1 [P.O.:240; I.V.:248.1] ?Out: 1000 [Urine:1000] ? ?Physical Exam ?Vitals and nursing note reviewed.  ?Constitutional:   ?   General: He is not in acute distress. ?Neck:  ?   Vascular: No JVD.  ?Cardiovascular:  ?   Rate and Rhythm: Normal rate and regular rhythm.  ?   Heart sounds: Normal heart sounds. No murmur heard. ?Pulmonary:  ?   Effort: Pulmonary effort is normal.  ?   Breath sounds: Normal breath sounds. No wheezing or rales.  ?Musculoskeletal:  ?   Right lower leg: No edema.  ?   Left lower leg: No edema.  ? ? ?Cardiac Studies: ? ?Coronary intervention 02/16/2022: ?LM: Normal ?LAD: No significant disease ?RCA: Prox and distal 30% disease ?Lcx: Large vessel with prox occlusion with organized heavy thrombus (Culprit) ?  ?Successful percutaneous coronary intervention prox Lcx ?    Aspiration thrombectomy ?    PTCA and stent placement 4.0 X 16 mm Synergy drug-eluting stent ?    Post dilatation with 4.0X8 mm [place] balloon up to 22 atm ?  ?100%--->0% residual stenosis ?TIMI flow 0-->II ?  ?Distal embolization due to late presentation.  ?Recommend aggrastat and nitro drip overnight. ?  ?Difficult procedure due to difficulty wiring tortuous Lcx vessel with organized thrombus requiring multiple wires and use of angled support catheter ?  ? ?  ? ? ?  ? ? ?Imaging/tests reviewed and independently interpreted: ?Hs trop >24,000 ? ?Telemetry 02/17/2022: ?Few  episodes of NSVT up to 9 beats ? ?EKG 02/17/2022: ?Sinus rhythm 87 beats ?Occasional PAC with aberrant conduction ?Minimal ST elevation inferolateral leads, improved from 02/16/2022 ? ?Echocardiogram: ?Pending ? ? ?Assessment & Recommendations: ? ?71 year old African-American male with hypertension, type 2 diabetes mellitus, presented with chest pain starting 4:30 AM.  Initial EKG showed no definite STEMI.  Patient underwent CT angiogram for PE given his chest pain and hypoxia, which was negative for PE.  Patient continued to have severe chest pain. Suspecting left circumflex occlusion, he was recommended for urgent coronary angiography and intervention. ? ?Acute coronary syndrome: ?No clear ST elevation to meet STEMI definition, but functionally a STEMI with occlusion of large proximal left circumflex with organized thrombus. ?Successful primary PCI with aspiration thrombectomy, Synergy 4.0 x 16 mm drug-eluting stent placed. ?Residual pain likely due to distal thromboembolization.  In addition, I also feel patient has a component of noncardiac pain that improved with Norco. ?Wean down nitroglycerin as tolerated. ?We will stop tirofiban after completion of 18-hour infusion. ?Continue aspirin and Brilinta for 1 year. ?Add metoprolol titrate 25 mg twice daily.  Continue home medication losartan 50 mg daily. ?Direct LDL 102. Added Crestor 40 mg daily. ?Added Jardiance 10 mg daily. ? ?Echocardiogram pending.  Will make decision regarding transfer to progressive unit later today, based on pain control and echocardiogram results. ? ?NSVT: ?Up to 9 beats.  Continue metoprolol. ? ?Type 2 diabetes mellitus: ?  A1c 6.5.  Can resume metformin on 02/19/2022.  Added Jardiance as above. ? ? ?CRITICAL CARE ?Performed by: Vernell Leep ?  ?Total critical care time: 32 minutes ?  ?Critical care time was exclusive of separately billable procedures and treating other patients. ?  ?Critical care was necessary to treat or prevent  imminent or life-threatening deterioration. ?  ?Critical care was time spent personally by me on the following activities: development of treatment plan with patient and/or surrogate as well as nursing, discussions with consultants, evaluation of patient's response to treatment, examination of patient, obtaining history from patient or surrogate, ordering and performing treatments and interventions, ordering and review of laboratory studies, ordering and review of radiographic studies, pulse oximetry and re-evaluation of patient's condition. ?  ? ? ?Nigel Mormon, MD ?Pager: [phone] ?Office: [phone] ? ?

## 2022-02-17 NOTE — Telephone Encounter (Signed)
Wait to call until Monday

## 2022-02-17 NOTE — Progress Notes (Signed)
CARDIAC REHAB PHASE I  ? ?PRE:  Rate/Rhythm: 108 SR with PACs ? ?  BP: sitting 164/104 ? ?  SaO2: 96 RA ? ?MODE:  Ambulation: 370 ft  ? ?POST:  Rate/Rhythm: 96 SR with PACs ? ?  BP: sitting 162/92  ? ?  SaO2: 97 RA ? ?Pt endorses 2/10 CP now off IV NTG. Feeling much better. Able to ambulate hall at slow pace, no change in CP. To recliner, BP elevated.  ? ?Discussed with pt and wife MI, stent, Brilinta importance, diet, exercise, NTG and CRPII. Pt receptive and will refer to [place] CRPII.  ?[id number]  ? ?Evan Brock CES, ACSM ?02/17/2022 ?2:15 PM ? ? ? ? ?

## 2022-02-17 NOTE — Progress Notes (Signed)
?  Echocardiogram ?2D Echocardiogram has been performed. ? ?Janalyn Harder ?02/17/2022, 11:46 AM ?

## 2022-02-17 NOTE — TOC Benefit Eligibility Note (Signed)
Patient Advocate Encounter  Insurance verification completed.    The patient is currently admitted and upon discharge could be taking Brilinta 90 mg.  The current 30 day co-pay is, $45.00.   The patient is insured through [employer] Part D     Malin Cervini, CPhT Pharmacy Patient Advocate Specialist [facility] Patient Advocate Team Direct Number: [phone]  Fax: [phone]        

## 2022-02-17 NOTE — Telephone Encounter (Signed)
TOC follow up in 2 week recent ACS, s/p PCI, EKG on arrival per ST. ?

## 2022-02-17 NOTE — Progress Notes (Signed)
Pt arrived to unit from 2 heart  VSS, A/O x 4,  CCMD called , pt oriented to unit,Will continue to monitor.  ? ?Everlean Cherry, RN ? ? ? 02/17/22 1748  ?Vitals  ?Temp 98.5 ?F (36.9 ?C)  ?Temp Source Oral  ?BP (!) 164/101  ?MAP (mmHg) 121  ?BP Location Left Arm  ?BP Method Automatic  ?Patient Position (if appropriate) Lying  ?Pulse Rate 96  ?Pulse Rate Source Monitor  ?ECG Heart Rate 97  ?Resp 20  ?Level of Consciousness  ?Level of Consciousness Alert  ?Oxygen Therapy  ?SpO2 97 %  ?O2 Device Room Air  ?ECG Monitoring  ?Cardiac Rhythm NSR  ?PCA/Epidural/Spinal Assessment  ?Respiratory Pattern Regular;Unlabored  ?Glasgow Coma Scale  ?Eye Opening 4  ?Best Verbal Response (NON-intubated) 5  ?Best Motor Response 6  ?Glasgow Coma Scale Score 15  ?MEWS Score  ?MEWS Temp 0  ?MEWS Systolic 0  ?MEWS Pulse 0  ?MEWS RR 0  ?MEWS LOC 0  ?MEWS Score 0  ?MEWS Score Color Green  ? ? ?

## 2022-02-17 NOTE — Plan of Care (Signed)
  Problem: Education: Goal: Knowledge of General Education information will improve Description Including pain rating scale, medication(s)/side effects and non-pharmacologic comfort measures Outcome: Progressing   

## 2022-02-18 ENCOUNTER — Inpatient Hospital Stay (HOSPITAL_COMMUNITY): Payer: Medicare HMO

## 2022-02-18 DIAGNOSIS — E782 Mixed hyperlipidemia: Secondary | ICD-10-CM

## 2022-02-18 DIAGNOSIS — Z955 Presence of coronary angioplasty implant and graft: Secondary | ICD-10-CM

## 2022-02-18 DIAGNOSIS — Z87891 Personal history of nicotine dependence: Secondary | ICD-10-CM

## 2022-02-18 DIAGNOSIS — E6609 Other obesity due to excess calories: Secondary | ICD-10-CM

## 2022-02-18 DIAGNOSIS — Z9861 Coronary angioplasty status: Secondary | ICD-10-CM

## 2022-02-18 DIAGNOSIS — I2510 Atherosclerotic heart disease of native coronary artery without angina pectoris: Secondary | ICD-10-CM

## 2022-02-18 DIAGNOSIS — E119 Type 2 diabetes mellitus without complications: Secondary | ICD-10-CM

## 2022-02-18 LAB — ECHOCARDIOGRAM COMPLETE
Area-P 1/2: 4.27 cm2
Calc EF: 58.1 %
Height: 69 in
MV M vel: 5.41 m/s
MV Peak grad: 117.1 mmHg
Radius: 0.4 cm
S' Lateral: 3.6 cm
Single Plane A2C EF: 56 %
Single Plane A4C EF: 57.6 %
Weight: 3360 oz

## 2022-02-18 LAB — BASIC METABOLIC PANEL
Anion gap: 7 (ref 5–15)
BUN: 10 mg/dL (ref 8–23)
CO2: 26 mmol/L (ref 22–32)
Calcium: 9.3 mg/dL (ref 8.9–10.3)
Chloride: 104 mmol/L (ref 98–111)
Creatinine, Ser: 1.1 mg/dL (ref 0.61–1.24)
GFR, Estimated: >60 mL/min (ref >60)
Glucose, Bld: 99 mg/dL (ref 70–99)
Potassium: 3.6 mmol/L (ref 3.5–5.1)
Sodium: 137 mmol/L (ref 135–145)

## 2022-02-18 LAB — CBC
HCT: 40.6 % (ref 39.0–52.0)
Hemoglobin: 13 g/dL (ref 13.0–17.0)
MCH: 26.2 pg (ref 26.0–34.0)
MCHC: 32 g/dL (ref 30.0–36.0)
MCV: 81.7 fL (ref 80.0–100.0)
Platelets: 301 10*3/uL (ref 150–400)
RBC: 4.97 MIL/uL (ref 4.22–5.81)
RDW: 14.6 % (ref 11.5–15.5)
WBC: 12 10*3/uL — ABNORMAL HIGH (ref 4.0–10.5)
nRBC: 0 % (ref 0.0–0.2)

## 2022-02-18 LAB — LIPOPROTEIN A (LPA): Lipoprotein (a): 119.8 nmol/L — ABNORMAL HIGH (ref <75.0)

## 2022-02-18 MED ORDER — LOSARTAN POTASSIUM 100 MG PO TABS: 100.0000 mg | ORAL_TABLET | Freq: Every day | ORAL | 0 refills | Status: DC

## 2022-02-18 MED ORDER — PERFLUTREN LIPID MICROSPHERE
1.0000 mL | INTRAVENOUS | Status: AC | PRN
  Administered 2022-02-18: 2 mL via INTRAVENOUS
  Filled 2022-02-18: qty 10

## 2022-02-18 MED ORDER — EMPAGLIFLOZIN 10 MG PO TABS
10.0000 mg | ORAL_TABLET | Freq: Every day | ORAL | 1 refills | Status: DC
Start: 2022-02-18 — End: 2022-04-26

## 2022-02-18 MED ORDER — SPIRONOLACTONE 25 MG PO TABS: 25.0000 mg | ORAL_TABLET | Freq: Every day | ORAL | 0 refills | Status: DC

## 2022-02-18 NOTE — Progress Notes (Signed)
Patient given discharge instructions. Wife present. Pharmacy stored home meds given to patient. PIV removed. Telemetry box removed, CCMD notified. Patient taken to vehicle in wheelchair by staff. ? ?Kenard Gower, RN  ?

## 2022-02-18 NOTE — Discharge Summary (Signed)
Physician Discharge Summary  ?Patient ID: ?Evan Brock ?MRN: [id number] ?DOB/AGE: Jun 17, 1951 71 y.o. ? ?Admit date: 02/16/2022 ?Discharge date: 02/18/2022 ? ?Primary Discharge Diagnosis: ?NSTEMI ?S/p LCX Aspiration thrombectomy followed by PTCA and stent placement 4.0 X 16 mm Synergy drug-eluting stent ? ?Secondary Discharge Diagnosis: ?Non-insulin-dependent diabetes mellitus type 2. ?Hyperlipidemia. ?Coronary artery calcification. ?Former smoker ?COPD. ?Obesity due to excess calories ? ?Hospital Course:  ? ?71 y.o. African-American male  with the above-mentioned cardiovascular risk factors and comorbidities presents to the hospital with a chief complaint of chest pain.  The chest pain was brought on at approximately 430 on the day of admission which woke him up from sleep, substernally located, pressure-like feeling, 10 out of 10, nonradiating, unable to comment on exertional discomfort as it began in the morning.  He says the discomfort was not improving presents to the hospital for further evaluation and management. ? ?Patient's EKG did not rule in for STEMI but given the ongoing discomfort and history pretest probability of obstructive CAD was high and he was taken to the Cath Lab urgently.  He was noted to have a culprit lesion of the proximal LCx underwent aspiration thrombectomy status post PTCA followed by stenting. ? ?Patient tolerated the procedure well and his medical therapy was uptitrated and was observed.  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 ? ?Patient is doing well today from a cardiovascular standpoint.  Denies angina pectoris or heart failure symptoms.  He is walked in the hallways without any symptoms of chest pain and is no longer on a nitro drip.  Patient wishes to go home. ? ? ?Discharge Exam: ?PHYSICAL EXAM: ? ?  02/18/2022  ? 12:01 PM 02/18/2022  ?  3:31 AM 02/17/2022  ? 11:31 PM  ?Vitals with BMI  ?Systolic 716  967 893  ?Diastolic 810 64 88  ?Pulse 70 74 74  ? ?Filed Weights  ? 02/16/22 0839  ?Weight: 95.3 kg  ? ?Net IO Since Admission: -1,754.73 mL [02/18/22 1503] ? ?CONSTITUTIONAL: Well-developed and well-nourished. No acute distress.  ?SKIN: Skin is warm and dry. No rash noted. No cyanosis. No pallor. No jaundice ?HEAD: Normocephalic and atraumatic.  ?EYES: No scleral icterus ?MOUTH/THROAT: Moist oral membranes.  ?NECK: No JVD present. No thyromegaly noted. No carotid bruits  ?LYMPHATIC: No visible cervical adenopathy.  ?CHEST Normal respiratory effort. No intercostal retractions  ?LUNGS: Clear to auscultation bilaterally.  No stridor. No wheezes. No rales.  ?CARDIOVASCULAR: Regular rate and rhythm, positive S1-S2, no murmurs rubs or gallops appreciated ?ABDOMINAL: Obese, soft, nontender, nondistended, positive bowel sounds in all 4 quadrants, no apparent ascites.  ?EXTREMITIES: No peripheral edema  ?HEMATOLOGIC: No significant bruising ?NEUROLOGIC: Oriented to person, place, and time. Nonfocal. Normal muscle tone.  ?PSYCHIATRIC: Normal mood and affect. Normal behavior. Cooperative ? ?Recommendations on discharge:  ?1. Recommend uninterrupted dual antiplatelet therapy with Aspirin 33m daily and Ticagrelor 977mtwice daily for a minimum of 12 months (ACS-Class I recommendation) until 02/17/2023. ? ?2.  Patient is educated on the importance of blood pressure management given his comorbid conditions as well as dilated proximal ascending aorta at 41 mm per echocardiogram.  Patient is asked to keep a log of his blood pressures and to review the readings and pharmacological therapy with either myself or PCP. ? ?3.  Educated the importance of improving his modifiable cardiovascular risk factors such as blood pressure control, glycemic control, lipid management, and increasing physical activity as tolerated with a  goal of 30 minutes a day 5 days a week. ? ?4.  Cardiac rehab strongly encouraged. ? ? ?CARDIAC  DATABASE: ?EKG: ?February 16, 2022@ 834am: Sinus rhythm, 75 bpm, normal axis, PVCs, without underlying injury pattern. ?  ?February 16, 2022 _0 :48 AM: Sinus rhythm, 79 bpm, PVCs, subtle ST changes in the inferior leads, without underlying injury pattern. ? ?Echocardiogram: ?02/17/2022: ?LVEF 60 to 65%, mild LVH, normal diastolic function, moderate pulmonary hypertension, moderate MR eccentric jet to his anterior mitral valve leaflet, trileaflet aortic valve without stenosis or regurgitation, mild dilatation of the ascending aorta 41 mm, RAP 8 mmHg ? ?Heart Catheterization: ?02/13/2022 ?LM: Normal ?LAD: No significant disease ?RCA: Prox and distal 30% disease ?Lcx: Large vessel with prox occlusion with organized heavy thrombus (Culprit) ?  ?Successful percutaneous coronary intervention prox Lcx ?    Aspiration thrombectomy ?    PTCA and stent placement 4.0 X 16 mm Synergy drug-eluting stent ?    Post dilatation with 4.0X8 mm [place] balloon up to 22 atm ?  ?100%--->0% residual stenosis ?TIMI flow 0-->II ?  ?Distal embolization due to late presentation.  ?Recommend aggrastat and nitro drip overnight. ?  ?Difficult procedure due to difficulty wiring tortuous Lcx vessel with organized thrombus requiring multiple wires and use of angled support catheter ? ?Labs: ?  ?Lab Results  ?Component Value Date  ? WBC 12.0 (H) 02/18/2022  ? HGB 13.0 02/18/2022  ? HCT 40.6 02/18/2022  ? MCV 81.7 02/18/2022  ? PLT 301 02/18/2022  ?  ?Recent Labs  ?Lab 02/18/22 ?0115  ?NA 137  ?K 3.6  ?CL 104  ?CO2 26  ?BUN 10  ?CREATININE 1.10  ?CALCIUM 9.3  ?GLUCOSE 99  ? ? ?Lipid Panel  ?Lab Results  ?Component Value Date  ? CHOL 171 02/17/2022  ? HDL 41 02/17/2022  ? LDLCALC 111 (H) 02/17/2022  ? LDLDIRECT 102.3 (H) 02/17/2022  ? TRIG 95 02/17/2022  ? CHOLHDL 4.2 02/17/2022  ? ?BNP (last 3 results) ?Recent Labs  ?  08/25/21 ?1201 02/16/22 ?0847  ?BNP 23.9 13.0  ? ? ?HEMOGLOBIN A1C ?Lab Results  ?Component Value Date  ? HGBA1C 6.5 (H) 02/17/2022  ? MPG 139.85  02/17/2022  ? ? ?Cardiac Panel (last 3 results) ?No results for input(s): CKTOTAL, CKMB, TROPONINI, RELINDX in the last 8760 hours. ? ?No results found for: CKTOTAL, CKMB, CKMBINDEX, TROPONINI  ? ?TSH ?Recent Labs  ?  02/17/22 ?0040  ?TSH 0.522  ? ? ?Radiology: ?DG Chest 2 View ? ?Result Date: 02/16/2022 ?CLINICAL DATA:  Chest pain. EXAM: CHEST - 2 VIEW COMPARISON:  November 12, 2021. FINDINGS: Stable cardiomediastinal silhouette. Minimal bibasilar subsegmental atelectasis is noted. Bony thorax is unremarkable. IMPRESSION: Minimal bibasilar subsegmental atelectasis. Electronically Signed   By: Marijo Conception M.D.   On: 02/16/2022 09:16  ? ?CT Angio Chest PE W and/or Wo Contrast ? ?Result Date: 02/16/2022 ?CLINICAL DATA:  Chest pain.  Elevated D-dimer. EXAM: CT ANGIOGRAPHY CHEST WITH CONTRAST TECHNIQUE: Multidetector CT imaging of the chest was performed using the standard protocol during bolus administration of intravenous contrast. Multiplanar CT image reconstructions and MIPs were obtained to evaluate the vascular anatomy. RADIATION DOSE REDUCTION: This exam was performed according to the departmental dose-optimization program which includes automated exposure control, adjustment of the mA and/or kV according to patient size and/or use of iterative reconstruction technique. CONTRAST:  190m OMNIPAQUE IOHEXOL 350 MG/ML SOLN COMPARISON:  CT scan 11/12/2021 FINDINGS: Cardiovascular: The heart is normal in size. No pericardial effusion. The aorta  is normal in caliber. Stable scattered atherosclerotic calcifications. Stable calcifications near the aortic valve. Stable scattered coronary artery calcifications. The pulmonary arterial tree is well opacified. No filling defects to suggest pulmonary embolism. Mediastinum/Nodes: Stable borderline mediastinal and hilar lymph nodes. No mass or overt adenopathy the. The esophagus is grossly normal. Lungs/Pleura: Stable lower lobe bronchiectasis and tree-in-bud type nodularity  suggesting chronic inflammation or possibly atypical infection such as MAC. No focal pulmonary infiltrates. No pleural effusions. No worrisome pulmonary lesions. Upper Abdomen: No significant upper abdominal findings. [place]

## 2022-02-18 NOTE — Progress Notes (Signed)
?  Echocardiogram ?2D Echocardiogram with contrast has been performed. ? ?Evan Brock ?02/18/2022, 9:28 AM ?

## 2022-02-18 NOTE — Progress Notes (Signed)
CARDIAC REHAB PHASE I  ? ?PRE:  Rate/Rhythm: 71 SR ? ?  BP: sitting 143/131 ? ?  SaO2: 96 RA ? ?MODE:  Ambulation: 450 ft  ? ?POST:  Rate/Rhythm: 88 SR ? ?  BP: sitting 154/101  ? ?  SaO2: 95 RA ? ?1200-1215 ?Patient sitting up upon arrival to room. No complaints. BP elevated. Patient states he has not had his medications but confirmed with primary RN that his metoprolol and losartan have been administered. Ambulated in hallway independently. 1 standing rest break for shortness of breath. Has not had inhalers today. Discussed with primary RN. Discharge disposition to be determined.  ? ?Larwance Rote, BSN  ?

## 2022-02-20 NOTE — Telephone Encounter (Signed)
Tried to call patient, to make him aware. NA, LMAM ? ?[place]: Can you order his labs?

## 2022-02-20 NOTE — Telephone Encounter (Signed)
Please order BMP and MG and BNP two days before he comes in.  ?Tell him to bring in a BP log as well.  ? ?Dr. Odis Hollingshead

## 2022-02-20 NOTE — Telephone Encounter (Signed)
To do

## 2022-02-20 NOTE — Telephone Encounter (Signed)
Location of hospitalization: [facility] ?Reason for hospitalization: chest pain ?Date of discharge: 02/18/2022 ?Date of first communication with patient: today ?Person contacting patient: Gaye Alken, [place] ?Current symptoms: none ?Do you understand why you were in the Hospital: Yes ?Questions regarding discharge instructions: None ?Where were you discharged to: Home ?Medications reviewed: Yes ?Allergies reviewed: Yes ?Dietary changes reviewed: Yes. Discussed low fat and low salt diet.  ?Referals reviewed: NA ?Activities of Daily Living: Able to with mild limitations ?Any transportation issues/concerns: None ?Any patient concerns: None ?Confirmed importance & date/time of Follow up appt: Yes ?Confirmed with patient if condition begins to worsen call. Pt was given the office number and encouraged to call back with questions or concerns: Yes  ?

## 2022-02-21 ENCOUNTER — Other Ambulatory Visit: Payer: Self-pay

## 2022-02-21 DIAGNOSIS — I10 Essential (primary) hypertension: Secondary | ICD-10-CM

## 2022-02-21 DIAGNOSIS — I214 Non-ST elevation (NSTEMI) myocardial infarction: Secondary | ICD-10-CM

## 2022-02-21 NOTE — Telephone Encounter (Signed)
Labs have been added

## 2022-02-24 ENCOUNTER — Telehealth (HOSPITAL_COMMUNITY): Payer: Self-pay

## 2022-02-24 NOTE — Telephone Encounter (Signed)
Attempted to call patient in regards to Cardiac Rehab - Unable to leave VM ?

## 2022-02-24 NOTE — Telephone Encounter (Signed)
Pt insurance is active and benefits verified through [employer] $10, DED 0/0 met, out of pocket $3,400/$850.24 met, co-insurance 0%. no pre-authorization required. Passport, 02/24/2022@10 :36am, REF# [id number] ? ?Will contact patient to see if he is interested in the Cardiac Rehab Program. If interested, patient will need to complete follow up appt. Once completed, patient will be contacted for scheduling upon review by the RN Navigator. ?

## 2022-03-03 ENCOUNTER — Encounter: Payer: Self-pay | Admitting: Cardiology

## 2022-03-03 ENCOUNTER — Ambulatory Visit: Payer: Medicare HMO | Admitting: Cardiology

## 2022-03-03 VITALS — BP 112/74 | HR 98 | Temp 98.1°F | Resp 17 | Ht 69.0 in | Wt 200.4 lb

## 2022-03-03 DIAGNOSIS — I77810 Thoracic aortic ectasia: Secondary | ICD-10-CM

## 2022-03-03 DIAGNOSIS — E782 Mixed hyperlipidemia: Secondary | ICD-10-CM

## 2022-03-03 DIAGNOSIS — Z9861 Coronary angioplasty status: Secondary | ICD-10-CM

## 2022-03-03 DIAGNOSIS — I10 Essential (primary) hypertension: Secondary | ICD-10-CM

## 2022-03-03 DIAGNOSIS — I214 Non-ST elevation (NSTEMI) myocardial infarction: Secondary | ICD-10-CM

## 2022-03-03 DIAGNOSIS — Z955 Presence of coronary angioplasty implant and graft: Secondary | ICD-10-CM

## 2022-03-03 DIAGNOSIS — J449 Chronic obstructive pulmonary disease, unspecified: Secondary | ICD-10-CM

## 2022-03-03 DIAGNOSIS — E119 Type 2 diabetes mellitus without complications: Secondary | ICD-10-CM

## 2022-03-03 DIAGNOSIS — Z87891 Personal history of nicotine dependence: Secondary | ICD-10-CM

## 2022-03-03 DIAGNOSIS — I2510 Atherosclerotic heart disease of native coronary artery without angina pectoris: Secondary | ICD-10-CM

## 2022-03-03 MED ORDER — METOPROLOL SUCCINATE ER 50 MG PO TB24: 50.0000 mg | ORAL_TABLET | Freq: Every morning | ORAL | 0 refills | Status: DC

## 2022-03-03 NOTE — Progress Notes (Signed)
? ?ID:  Evan Brock, DOB 08/13/1951, MRN [id number] ? ?PCP:  Sherald Hess., MD  ?Cardiologist:  Rex Kras, DO, [facility] (established care February 16, 2022) ? ?Date: 03/03/22 ?Last Visit: February 18, 2022 ? ?Chief Complaint  ?Patient presents with  ? New Patient (Initial Visit)  ? Hospitalization Follow-up  ? S/P PCI  ? ? ?HPI  ?Evan Brock is a 71 y.o. 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 ? ?Seen in consultation during his hospitalization in April 2023 and due to ongoing cardiac discomfort patient was taken to Cath Lab urgently to evaluate for obstructive CAD.  He was noted to have a culprit lesion involving the proximal LCx underwent aspiration thrombectomy status post PTCA and stenting.  Patient remained asymptomatic during the remainder of the hospitalization was discharged home now presents for follow-up. ? ?Since discharge patient states that he is doing well from a cardiovascular standpoint.  No reoccurrence of chest pain.  No heart failure symptoms.  Has not started cardiac rehab.  And no use of sublingual nitroglycerin tablets.  He is compliant with current medical regimen. ? ?Patient's wife is present during today's office visit and provides collateral history. ? ?ALLERGIES: ?No Known Allergies ? ?MEDICATION LIST PRIOR TO VISIT: ?Current Meds  ?Medication Sig  ? albuterol (PROVENTIL) (2.5 MG/3ML) 0.083% nebulizer solution Take 3 mLs (2.5 mg total) by nebulization every 4 (four) hours as needed for wheezing or shortness of breath.  ? albuterol (VENTOLIN HFA) 108 (90 Base) MCG/ACT inhaler INHALE 1 PUFF FOUR TIMES DAILY, AS NEEDED (Patient taking differently: 2 puffs every 4 (four)  hours as needed for wheezing or shortness of breath.)  ? aspirin 81 MG EC tablet Take 1 tablet (81 mg total) by mouth daily. Swallow whole.  ? Blood Pressure Monitor KIT 1 kit by Does not apply route 3 (three) times daily as needed.  ? Budeson-Glycopyrrol-Formoterol (BREZTRI AEROSPHERE) 160-9-4.8 MCG/ACT AERO Inhale 2 puffs into the lungs in the morning and at bedtime.  ? empagliflozin (JARDIANCE) 10 MG TABS tablet Take 1 tablet (10 mg total) by mouth daily.  ? gabapentin (NEURONTIN) 800 MG tablet Take 800 mg by mouth 3 (three) times daily.  ? HYDROcodone-acetaminophen (NORCO) 10-325 MG tablet Take 1 tablet by mouth every 6 (six) hours as needed for moderate pain or severe pain.  ? losartan (COZAAR) 100 MG tablet Take 1 tablet (100 mg total) by mouth daily.  ? metFORMIN (GLUCOPHAGE) 500 MG tablet Take 1 tablet (500 mg total) by mouth 2 (two) times daily with a meal. Resume on Sunday 02/19/2022  ? metoprolol succinate (TOPROL XL) 50 MG 24 hr tablet Take 1 tablet (50 mg total) by mouth every morning. Take with or immediately following a meal.  ? nitroGLYCERIN (NITROSTAT) 0.4 MG SL tablet Place 1 tablet (0.4 mg total) under the tongue every 5 (five) minutes as needed for chest pain.  ? rosuvastatin (CRESTOR) 40 MG tablet Take 1 tablet (40 mg total) by mouth at bedtime.  ? spironolactone (ALDACTONE) 25 MG tablet Take 1 tablet (25 mg total) by mouth daily at 10 pm.  ? tamsulosin (FLOMAX) 0.4 MG CAPS capsule Take 0.8 mg by mouth daily after supper.  ? ticagrelor (BRILINTA) 90 MG TABS tablet Take 1 tablet (90  mg total) by mouth 2 (two) times daily.  ? tiZANidine (ZANAFLEX) 4 MG tablet Take 4 mg by mouth every 8 (eight) hours as needed for muscle spasms.  ? TRELEGY ELLIPTA 100-62.5-25 MCG/ACT AEPB Take 1 puff by mouth daily.  ? [DISCONTINUED] losartan (COZAAR) 50 MG tablet Take 1 tablet (50 mg total) by mouth daily.  ? [DISCONTINUED] metoprolol tartrate (LOPRESSOR) 25 MG tablet Take 1 tablet (25 mg total) by mouth 2 (two)  times daily.  ?  ? ?PAST MEDICAL HISTORY: ?Past Medical History:  ?Diagnosis Date  ? Arthritis   ? "hips" (10/01/2013)  ? Asthma   ? COPD (chronic obstructive pulmonary disease) ([place])   ? Exertional shortness of breath   ? Hypertension   ? ? ?PAST SURGICAL HISTORY: ?Past Surgical History:  ?Procedure Laterality Date  ? CORONARY STENT INTERVENTION N/A 02/16/2022  ? Procedure: CORONARY STENT INTERVENTION;  Surgeon: Nigel Mormon, MD;  Location: [place] [facility];  Service: Cardiovascular;  Laterality: N/A;  ? CORONARY THROMBECTOMY N/A 02/16/2022  ? Procedure: Coronary Thrombectomy;  Surgeon: Nigel Mormon, MD;  Location: [place] [facility];  Service: Cardiovascular;  Laterality: N/A;  ? JOINT REPLACEMENT    ? LEFT HEART CATH AND CORONARY ANGIOGRAPHY N/A 02/16/2022  ? Procedure: LEFT HEART CATH AND CORONARY ANGIOGRAPHY;  Surgeon: Nigel Mormon, MD;  Location: [place] [facility];  Service: Cardiovascular;  Laterality: N/A;  ? TOTAL HIP ARTHROPLASTY Right 2010  ? TOTAL HIP ARTHROPLASTY Left 08/19/2013  ? Procedure: TOTAL HIP ARTHROPLASTY ANTERIOR APPROACH;  Surgeon: Hessie Dibble, MD;  Location: [place] [place];  Service: Orthopedics;  Laterality: Left;  ? ? ?FAMILY HISTORY: ?The patient family history includes Diabetes in his mother; Hypertension in his father; Lupus in his sister. ? ?SOCIAL HISTORY:  ?The patient  reports that he quit smoking about 31 years ago. His smoking use included cigarettes. He has never used smokeless tobacco. He reports current alcohol use of about 3.0 standard drinks per week. He reports current drug use. Drug: Marijuana. ? ?REVIEW OF SYSTEMS: ?Review of Systems  ?Cardiovascular:  Negative for chest pain, claudication, dyspnea on exertion, leg swelling, near-syncope, orthopnea, palpitations, paroxysmal nocturnal dyspnea and syncope.  ?Respiratory:  Negative for shortness of breath.   ?Hematologic/Lymphatic: Negative for bleeding problem.  ? ?PHYSICAL EXAM: ? ?  03/03/2022  ?  1:47  PM 02/18/2022  ? 12:01 PM 02/18/2022  ?  3:31 AM  ?Vitals with BMI  ?Height 5' 9"     ?Weight 200 lbs 6 oz    ?BMI 29.58    ?Systolic 174 081 448  ?Diastolic 74 185 64  ?Pulse 98 70 74  ? ? ?CONSTITUTIONAL: Well-developed and well-nourished. No acute distress.  ?SKIN: Skin is warm and dry. No rash noted. No cyanosis. No pallor. No jaundice ?HEAD: Normocephalic and atraumatic.  ?EYES: No scleral icterus ?MOUTH/THROAT: Moist oral membranes.  ?NECK: No JVD present. No thyromegaly noted. No carotid bruits  ?LYMPHATIC: No visible cervical adenopathy.  ?CHEST Normal respiratory effort. No intercostal retractions  ?LUNGS: Clear to auscultation bilaterally.  No stridor. No wheezes. No rales.  ?CARDIOVASCULAR: Regular rate and rhythm, positive S1-S2, no murmurs rubs or gallops appreciated. ?ABDOMINAL: Obese, soft, nontender, nondistended, positive bowel sounds in all 4 quadrants, no apparent ascites.  ?EXTREMITIES: No peripheral edema, warm to touch, 2+ bilateral DP and PT pulses ?HEMATOLOGIC: No significant bruising ?NEUROLOGIC: Oriented to person, place, and time. Nonfocal. Normal muscle tone.  ?PSYCHIATRIC: Normal mood and affect. Normal behavior. Cooperative ? ?CARDIAC  DATABASE: ?EKG: ?03/03/2022: Normal sinus rhythm, 80 bpm, without underlying injury pattern ? ?Echocardiogram: ?02/17/2022: ?LVEF 60 to 65%, mild LVH, normal diastolic function, moderate pulmonary hypertension, moderate MR eccentric jet to his anterior mitral valve leaflet, trileaflet aortic valve without stenosis or regurgitation, mild dilatation of the ascending aorta 41 mm, RAP 8 mmHg ?  ?Heart Catheterization: ?02/16/2022 ?LM: Normal ?LAD: No significant disease ?RCA: Prox and distal 30% disease ?Lcx: Large vessel with prox occlusion with organized heavy thrombus ([place]) ?  ?Successful percutaneous coronary intervention prox Lcx ?    Aspiration thrombectomy ?    PTCA and stent placement 4.0 X 16 mm Synergy drug-eluting stent ?    Post dilatation with  4.0X8 mm [place] balloon up to 22 atm ?  ?100%--->0% residual stenosis ?TIMI flow 0-->II ?  ?Distal embolization due to late presentation.  ?Recommend aggrastat and nitro drip overnight. ?  ?Difficult procedur

## 2022-03-04 ENCOUNTER — Other Ambulatory Visit (HOSPITAL_COMMUNITY): Payer: Self-pay

## 2022-03-04 ENCOUNTER — Telehealth (HOSPITAL_COMMUNITY): Payer: Self-pay | Admitting: Pharmacist

## 2022-03-04 NOTE — Telephone Encounter (Signed)
Pharmacy Transitions of Care Follow-up Telephone Call ? ?Date of discharge: 02/17/2022  ?Discharge Diagnosis: NSTEMI ? ?How have you been since you were released from the hospital? Good  ? ?Medication changes made at discharge: ? - START: Aspirin Low Dose, Brilinta, empagliflozin ,metoprolol tartrate  ?nitroGLYCERIN, rosuvastatin, ?spironolactone ? - STOPPED: chlorthalidone, sildenafil,  ? - CHANGED: losartan, metformin ? ?Medication changes verified by the patient? YES (Yes/No) ?  ? ?Medication Accessibility: ? ?Home Pharmacy: [facility] [phone]  ? ?Was the patient provided with refills on discharged medications? yes  ? ?Have all prescriptions been transferred from [facility] to home pharmacy? yes  ?  ? ?Medication Review: ? ?TICAGRELOR (BRILINTA) ?Ticagrelor 90 mg BID initiated on .  ?- Educated patient on expected duration of therapy of aspirin with ticagrelor. Advised patient that dose of ticagrelor will be reduced after . Aspirin will be continued indefinitely/discontinued. ?- Discussed importance of taking medication around the same time every day, ?- Reviewed potential DDIs with patient ?- Advised patient of medications to avoid (NSAIDs, aspirin maintenance doses>100 mg daily) ?- Educated that Tylenol (acetaminophen) will be the preferred analgesic to prevent risk of bleeding  ?- Emphasized importance of monitoring for signs and symptoms of bleeding (abnormal bruising, prolonged bleeding, nose bleeds, bleeding from gums, discolored urine, black tarry stools)  ?- Educated patient to notify doctor if shortness of breath or abnormal heartbeat occur ?- Advised patient to alert all providers of antiplatelet therapy prior to starting a new medication or having a procedure  ? ?Follow-up Appointments: ? ?PT aware of f/u appts. ? ?If their condition worsens, is the pt aware to call PCP or go to the Emergency Dept.? Yes ? ?Final Patient Assessment: ?Patient reports he is doing well.  Medication regimen reviewed, transfers  completed at request of patient.  ?

## 2022-03-31 ENCOUNTER — Encounter (HOSPITAL_COMMUNITY): Payer: Self-pay

## 2022-04-10 ENCOUNTER — Other Ambulatory Visit: Payer: Self-pay | Admitting: Cardiology

## 2022-04-16 ENCOUNTER — Other Ambulatory Visit: Payer: Self-pay | Admitting: Cardiology

## 2022-04-24 ENCOUNTER — Other Ambulatory Visit: Payer: Self-pay | Admitting: Cardiology

## 2022-04-24 ENCOUNTER — Telehealth (HOSPITAL_COMMUNITY): Payer: Self-pay

## 2022-04-24 NOTE — Telephone Encounter (Signed)
No response from pt in regards to cardiac rehab. Closed referral 

## 2022-05-02 ENCOUNTER — Other Ambulatory Visit: Payer: Self-pay | Admitting: Cardiology

## 2022-05-02 DIAGNOSIS — I214 Non-ST elevation (NSTEMI) myocardial infarction: Secondary | ICD-10-CM

## 2022-05-16 ENCOUNTER — Other Ambulatory Visit: Payer: Self-pay | Admitting: Cardiology

## 2022-06-02 ENCOUNTER — Encounter: Payer: Self-pay | Admitting: Cardiology

## 2022-06-02 ENCOUNTER — Ambulatory Visit: Payer: Medicare HMO | Admitting: Cardiology

## 2022-06-02 VITALS — BP 127/84 | HR 82 | Temp 98.7°F | Resp 16 | Ht 69.0 in | Wt 196.0 lb

## 2022-06-02 DIAGNOSIS — I2510 Atherosclerotic heart disease of native coronary artery without angina pectoris: Secondary | ICD-10-CM

## 2022-06-02 DIAGNOSIS — E782 Mixed hyperlipidemia: Secondary | ICD-10-CM

## 2022-06-02 DIAGNOSIS — E119 Type 2 diabetes mellitus without complications: Secondary | ICD-10-CM

## 2022-06-02 DIAGNOSIS — Z955 Presence of coronary angioplasty implant and graft: Secondary | ICD-10-CM

## 2022-06-02 DIAGNOSIS — I77810 Thoracic aortic ectasia: Secondary | ICD-10-CM

## 2022-06-02 DIAGNOSIS — Z87891 Personal history of nicotine dependence: Secondary | ICD-10-CM

## 2022-06-02 DIAGNOSIS — I214 Non-ST elevation (NSTEMI) myocardial infarction: Secondary | ICD-10-CM

## 2022-06-02 DIAGNOSIS — I10 Essential (primary) hypertension: Secondary | ICD-10-CM

## 2022-06-02 DIAGNOSIS — J449 Chronic obstructive pulmonary disease, unspecified: Secondary | ICD-10-CM

## 2022-06-02 MED ORDER — TICAGRELOR 90 MG PO TABS: 90.0000 mg | ORAL_TABLET | Freq: Two times a day (BID) | ORAL | 3 refills | Status: DC

## 2022-06-02 MED ORDER — EMPAGLIFLOZIN 10 MG PO TABS
10.0000 mg | ORAL_TABLET | Freq: Every day | ORAL | 0 refills | Status: DC
Start: 2022-06-02 — End: 2022-07-11

## 2022-06-02 NOTE — Progress Notes (Signed)
ID:  Evan Brock, DOB [birth year], MRN [id number]  PCP:  Sherald Hess., MD  Cardiologist:  Rex Kras, DO, [facility] (established care February 16, 2022)  Date: 06/02/22 Last Office Visit: 03/03/2022  Chief Complaint  Patient presents with   Follow-up    72-monthfollow-up for CAD    HPI  Evan Brock a 71y.o. 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  Seen in consultation during his hospitalization in April 2023 and due to ongoing cardiac discomfort patient was taken to Cath Lab urgently to evaluate for obstructive CAD.  He was noted to have a culprit lesion involving the proximal LCx underwent aspiration thrombectomy status post PTCA and stenting.   Patient presents today for follow-up.  He is asymptomatic currently but approximately 2 months ago had some chest discomfort and took sublingual nitroglycerin tablets and the symptoms resolved.  He did not go to the ED for evaluation or management.  Reviewed his medications at today's visit.  He tells me that he stopped Brilinta approximately  3 weeks ago because it is too expensive. Did not call the office.   Based on EMR his cardiac rehab referral was closed due to no response.  Patient states that he is physically active on a day-to-day basis and also maintains his lawn via a push mower and remains asymptomatic.  He chooses not to undergo cardiac rehab.  ALLERGIES: No Known Allergies  MEDICATION LIST PRIOR TO VISIT: Current Meds  Medication Sig   albuterol (PROVENTIL) (2.5 MG/3ML) 0.083% nebulizer solution Take 3 mLs (2.5 mg total) by nebulization every 4 (four) hours as needed for wheezing or shortness of  breath.   albuterol (VENTOLIN HFA) 108 (90 Base) MCG/ACT inhaler INHALE 1 PUFF FOUR TIMES DAILY, AS NEEDED (Patient taking differently: 2 puffs every 4 (four) hours as needed for wheezing or shortness of breath.)   aspirin 81 MG EC tablet Take 1 tablet (81 mg total) by mouth daily. Swallow whole.   Blood Pressure Monitor KIT 1 kit by Does not apply route 3 (three) times daily as needed.   Budeson-Glycopyrrol-Formoterol (BREZTRI AEROSPHERE) 160-9-4.8 MCG/ACT AERO Inhale 2 puffs into the lungs in the morning and at bedtime.   Cholecalciferol (VITAMIN D3) 25 MCG (1000 UT) CAPS Take 1 capsule by mouth daily.   gabapentin (NEURONTIN) 800 MG tablet Take 800 mg by mouth 3 (three) times daily.   HYDROcodone-acetaminophen (NORCO) 10-325 MG tablet Take 1 tablet by mouth every 6 (six) hours as needed for moderate pain or severe pain.   losartan (COZAAR) 100 MG tablet TAKE 1 TABLET(100 MG) BY MOUTH DAILY   metFORMIN (GLUCOPHAGE) 500 MG tablet Take 1 tablet (500 mg total) by mouth 2 (two) times daily with a meal. Resume on Sunday 02/19/2022   metoprolol succinate (TOPROL-XL) 50 MG 24 hr tablet TAKE 1 TABLET(50 MG) BY MOUTH EVERY MORNING WITH OR IMMEDIATELY FOLLOWING A MEAL   nitroGLYCERIN (NITROSTAT) 0.4 MG SL tablet Place 1 tablet (0.4 mg total) under the tongue every 5 (five) minutes as needed for chest pain.   Prenatal Vit-Fe Fumarate-FA (PRENATAL VITAMIN PLUS LOW IRON) 27-1 MG TABS Take 27 mg by mouth daily.   rosuvastatin (CRESTOR) 40 MG tablet TAKE 1 TABLET BY MOUTH AT BEDTIME  spironolactone (ALDACTONE) 25 MG tablet TAKE 1 TABLET(25 MG) BY MOUTH DAILY AT 10 PM   tamsulosin (FLOMAX) 0.4 MG CAPS capsule Take 0.8 mg by mouth daily after supper.   tiZANidine (ZANAFLEX) 4 MG tablet Take 4 mg by mouth every 8 (eight) hours as needed for muscle spasms.   TRELEGY ELLIPTA 100-62.5-25 MCG/ACT AEPB Take 1 puff by mouth daily.   [DISCONTINUED] BRILINTA 90 MG TABS tablet TAKE ONE TABLET BY MOUTH TWICE DAILY      PAST MEDICAL HISTORY: Past Medical History:  Diagnosis Date   Arthritis    "hips" (10/01/2013)   Asthma    COPD (chronic obstructive pulmonary disease) (HCC)    Coronary artery disease    Exertional shortness of breath    Hyperlipidemia    Hypertension     PAST SURGICAL HISTORY: Past Surgical History:  Procedure Laterality Date   CORONARY STENT INTERVENTION N/A 02/16/2022   Procedure: CORONARY STENT INTERVENTION;  Surgeon: Nigel Mormon, MD;  Location: [place] [facility];  Service: Cardiovascular;  Laterality: N/A;   CORONARY THROMBECTOMY N/A 02/16/2022   Procedure: Coronary Thrombectomy;  Surgeon: Nigel Mormon, MD;  Location: [place] [facility];  Service: Cardiovascular;  Laterality: N/A;   JOINT REPLACEMENT     LEFT HEART CATH AND CORONARY ANGIOGRAPHY N/A 02/16/2022   Procedure: LEFT HEART CATH AND CORONARY ANGIOGRAPHY;  Surgeon: Nigel Mormon, MD;  Location: [place] [facility];  Service: Cardiovascular;  Laterality: N/A;   TOTAL HIP ARTHROPLASTY Right 2010   TOTAL HIP ARTHROPLASTY Left 08/19/2013   Procedure: TOTAL HIP ARTHROPLASTY ANTERIOR APPROACH;  Surgeon: Hessie Dibble, MD;  Location: [place];  Service: Orthopedics;  Laterality: Left;    FAMILY HISTORY: The patient family history includes Diabetes in his mother; Hypertension in his father; Lupus in his sister.  SOCIAL HISTORY:  The patient  reports that he quit smoking about 31 years ago. His smoking use included cigarettes. He has never used smokeless tobacco. He reports current alcohol use of about 3.0 standard drinks of alcohol per week. He reports that he does not currently use drugs after having used the following drugs: Marijuana.  REVIEW OF SYSTEMS: Review of Systems  Cardiovascular:  Negative for chest pain, claudication, dyspnea on exertion, leg swelling, near-syncope, orthopnea, palpitations, paroxysmal nocturnal dyspnea and syncope.  Respiratory:  Negative for shortness of breath.    Hematologic/Lymphatic: Negative for bleeding problem.    PHYSICAL EXAM:    06/02/2022   11:47 AM 03/03/2022    1:47 PM 02/18/2022   12:01 PM  Vitals with BMI  Height _0  _1    Weight 196 lbs 200 lbs 6 oz   BMI 76.19 50.93   Systolic 267 124 580  Diastolic 84 74 998  Pulse 82 98 70    CONSTITUTIONAL: Well-developed and well-nourished. No acute distress.  SKIN: Skin is warm and dry. No rash noted. No cyanosis. No pallor. No jaundice HEAD: Normocephalic and atraumatic.  EYES: No scleral icterus MOUTH/THROAT: Moist oral membranes.  NECK: No JVD present. No thyromegaly noted. No carotid bruits  LYMPHATIC: No visible cervical adenopathy.  CHEST Normal respiratory effort. No intercostal retractions  LUNGS: Clear to auscultation bilaterally.  No stridor. No wheezes. No rales.  CARDIOVASCULAR: Regular rate and rhythm, positive S1-S2, no murmurs rubs or gallops appreciated. ABDOMINAL: Obese, soft, nontender, nondistended, positive bowel sounds in all 4 quadrants, no apparent ascites.  EXTREMITIES: No peripheral edema, warm to touch, 2+ bilateral DP and PT pulses HEMATOLOGIC: No significant  bruising NEUROLOGIC: Oriented to person, place, and time. Nonfocal. Normal muscle tone.  PSYCHIATRIC: Normal mood and affect. Normal behavior. Cooperative  CARDIAC DATABASE: EKG: 03/03/2022: Normal sinus rhythm, 80 bpm, without underlying injury pattern  Echocardiogram: 02/17/2022: LVEF 60 to 65%, mild LVH, normal diastolic function, moderate pulmonary hypertension, moderate MR eccentric jet to his anterior mitral valve leaflet, trileaflet aortic valve without stenosis or regurgitation, mild dilatation of the ascending aorta 41 mm, RAP 8 mmHg   Heart Catheterization: 02/16/2022 LM: Normal LAD: No significant disease RCA: Prox and distal 30% disease Lcx: Large vessel with prox occlusion with organized heavy thrombus (Culprit)   Successful percutaneous coronary intervention prox Lcx      Aspiration thrombectomy     PTCA and stent placement 4.0 X 16 mm Synergy drug-eluting stent     Post dilatation with 4.0X8 mm Dora balloon up to 22 atm   100%--->0% residual stenosis TIMI flow 0-->II   Distal embolization due to late presentation.  Recommend aggrastat and nitro drip overnight.   Difficult procedure due to difficulty wiring tortuous Lcx vessel with organized thrombus requiring multiple wires and use of angled support catheter  LABORATORY DATA:    Latest Ref Rng & Units 02/18/2022    1:15 AM 02/17/2022   12:40 AM 02/16/2022    9:34 PM  CBC  WBC 4.0 - 10.5 K/uL 12.0  10.4  11.7   Hemoglobin 13.0 - 17.0 g/dL 13.0  12.8  12.6   Hematocrit 39.0 - 52.0 % 40.6  41.0  40.7   Platelets 150 - 400 K/uL 301  323  299        Latest Ref Rng & Units 02/18/2022    1:15 AM 02/17/2022   12:40 AM 02/16/2022    8:47 AM  CMP  Glucose 70 - 99 mg/dL 99  113  156   BUN 8 - 23 mg/dL _0 Creatinine 0.61 - 1.24 mg/dL 1.10  1.01  1.10   Sodium 135 - 145 mmol/L 137  137  142   Potassium 3.5 - 5.1 mmol/L 3.6  3.3  4.0   Chloride 98 - 111 mmol/L 104  100  100   CO2 22 - 32 mmol/L 26  29  37   Calcium 8.9 - 10.3 mg/dL 9.3  8.8  9.2     Lipid Panel  Lab Results  Component Value Date   CHOL 171 02/17/2022   HDL 41 02/17/2022   LDLCALC 111 (H) 02/17/2022   LDLDIRECT 102.3 (H) 02/17/2022   TRIG 95 02/17/2022   CHOLHDL 4.2 02/17/2022    No components found for: "NTPROBNP" No results for input(s): "PROBNP" in the last 8760 hours. Recent Labs    02/17/22 0040  TSH 0.522    BMP Recent Labs    02/16/22 0847 02/17/22 0040 02/18/22 0115  NA 142 137 137  K 4.0 3.3* 3.6  CL 100 100 104  CO2 37* 29 26  GLUCOSE 156* 113* 99  BUN _1 CREATININE 1.10 1.01 1.10  CALCIUM 9.2 8.8* 9.3  GFRNONAA >60 >60 >60    HEMOGLOBIN A1C Lab Results  Component Value Date   HGBA1C 6.5 (H) 02/17/2022   MPG 139.85 02/17/2022    IMPRESSION:    ICD-10-CM   1. Atherosclerosis of  native coronary artery of native heart without angina pectoris  I25.10 empagliflozin (JARDIANCE) 10 MG TABS tablet    ticagrelor (BRILINTA) 90 MG TABS tablet    2. Status  post coronary artery stent placement  Z95.5 empagliflozin (JARDIANCE) 10 MG TABS tablet    ticagrelor (BRILINTA) 90 MG TABS tablet    3. Non-ST elevation (NSTEMI) myocardial infarction (HCC)  I21.4 empagliflozin (JARDIANCE) 10 MG TABS tablet    ticagrelor (BRILINTA) 90 MG TABS tablet    4. Essential hypertension, benign  I10     5. Ascending aorta dilatation (HCC)  I77.810 CT CHEST WO CONTRAST    6. Non-insulin dependent type 2 diabetes mellitus ([place])  E11.9     7. Mixed hyperlipidemia  E78.2     8. Former smoker  Z87.891     19. Chronic obstructive pulmonary disease, unspecified COPD type ([place])  J44.9        RECOMMENDATIONS: Evan Brock is a 71 y.o. 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  Atherosclerosis of native coronary artery of native heart without angina pectoris / s/p PCI / Hx NSTEMI: Index event February 16, 2022. Underwent aspiration thrombectomy followed by PTCA and PCI to the culprit LCx. Recommended dual antiplatelet therapy for 1 year-until February 17, 2023 For reasons unknown he stopped taking Brilinta several weeks ago because he was too expensive.  He did not inform the office of his decision or asked for alternatives.  Patient is educated on the importance of dual antiplatelet therapy after recent ACS and coronary intervention.  He verbalized understanding of its importance. Gave him 1 month worth of sample for Brilinta &  co-pay card to help decrease his out-of-pocket cost.  If the  cost of Brilinta still high or the co-pay card does not work recommended him to apply for patient assistance. If the cost of Brilinta still prohibitive then I would recommend transitioning to Plavix. Patient will call us back next week with an update with regards to his Brilinta coverage. Also provided him with patient assistance form for Jardiance. Patient has refused to proceed with cardiac rehab and is functionally doing well. Reemphasized importance of medication compliance and improving his modifiable cardiovascular risk factors.  Essential hypertension, benign Office blood pressures are well-controlled. Recommend a goal blood pressure of 120-130 mmHg if able to tolerate given his dilated ascending aorta.  Ascending aorta dilatation (HCC) 41 mm ascending aorta based on echocardiogram 02/18/2022. Will repeat CT of the chest without contrast in April 2024 to reevaluate.  Non-insulin dependent type 2 diabetes mellitus ([place]) Educated him on the importance of glycemic control. Continue metformin, Jardiance, statin therapy  Mixed hyperlipidemia Tolerating statin therapy well without any side effects or intolerance. Would recommend a goal LDL less than 55 mg/dL. Will monitor peripherally.  FINAL MEDICATION LIST END OF ENCOUNTER: Meds ordered this encounter  Medications   empagliflozin (JARDIANCE) 10 MG TABS tablet    Sig: Take 1 tablet (10 mg total) by mouth daily.    Dispense:  90 tablet    Refill:  0   ticagrelor (BRILINTA) 90 MG TABS tablet    Sig: Take 1 tablet (90 mg total) by mouth 2 (two) times daily.    Dispense:  180 tablet    Refill:  3    Medications Discontinued During This Encounter  Medication Reason   metFORMIN (GLUCOPHAGE) 500 MG tablet    BRILINTA 90 MG TABS tablet Reorder   JARDIANCE 10  MG TABS tablet Reorder     Current Outpatient Medications:    albuterol (PROVENTIL) (2.5 MG/3ML) 0.083% nebulizer solution, Take 3 mLs (2.5 mg total) by nebulization every 4  (four) hours as needed for wheezing or shortness of breath., Disp: 75 mL, Rfl: 0   albuterol (VENTOLIN HFA) 108 (90 Base) MCG/ACT inhaler, INHALE 1 PUFF FOUR TIMES DAILY, AS NEEDED (Patient taking differently: 2 puffs every 4 (four) hours as needed for wheezing or shortness of breath.), Disp: 8 g, Rfl: 0   aspirin 81 MG EC tablet, Take 1 tablet (81 mg total) by mouth daily. Swallow whole., Disp: 30 tablet, Rfl: 1   Blood Pressure Monitor KIT, 1 kit by Does not apply route 3 (three) times daily as needed., Disp: 1 kit, Rfl: 0   Budeson-Glycopyrrol-Formoterol (BREZTRI AEROSPHERE) 160-9-4.8 MCG/ACT AERO, Inhale 2 puffs into the lungs in the morning and at bedtime., Disp: 5.9 g, Rfl: 0   Cholecalciferol (VITAMIN D3) 25 MCG (1000 UT) CAPS, Take 1 capsule by mouth daily., Disp: , Rfl:    gabapentin (NEURONTIN) 800 MG tablet, Take 800 mg by mouth 3 (three) times daily., Disp: , Rfl:    HYDROcodone-acetaminophen (NORCO) 10-325 MG tablet, Take 1 tablet by mouth every 6 (six) hours as needed for moderate pain or severe pain., Disp: , Rfl:    losartan (COZAAR) 100 MG tablet, TAKE 1 TABLET(100 MG) BY MOUTH DAILY, Disp: 90 tablet, Rfl: 0   metFORMIN (GLUCOPHAGE) 500 MG tablet, Take 1 tablet (500 mg total) by mouth 2 (two) times daily with a meal. Resume on Sunday 02/19/2022, Disp: 180 tablet, Rfl: 1   metoprolol succinate (TOPROL-XL) 50 MG 24 hr tablet, TAKE 1 TABLET(50 MG) BY MOUTH EVERY MORNING WITH OR IMMEDIATELY FOLLOWING A MEAL, Disp: 90 tablet, Rfl: 0   nitroGLYCERIN (NITROSTAT) 0.4 MG SL tablet, Place 1 tablet (0.4 mg total) under the tongue every 5 (five) minutes as needed for chest pain., Disp: 25 tablet, Rfl: 1   Prenatal Vit-Fe Fumarate-FA (PRENATAL VITAMIN PLUS LOW IRON) 27-1 MG TABS, Take 27 mg by mouth daily., Disp: , Rfl:    rosuvastatin (CRESTOR) 40 MG tablet, TAKE 1 TABLET BY MOUTH AT BEDTIME, Disp: 90 tablet, Rfl: 0   spironolactone (ALDACTONE) 25 MG tablet, TAKE 1 TABLET(25 MG) BY MOUTH DAILY AT  10 PM, Disp: 90 tablet, Rfl: 0   tamsulosin (FLOMAX) 0.4 MG CAPS capsule, Take 0.8 mg by mouth daily after supper., Disp: , Rfl:    tiZANidine (ZANAFLEX) 4 MG tablet, Take 4 mg by mouth every 8 (eight) hours as needed for muscle spasms., Disp: , Rfl:    TRELEGY ELLIPTA 100-62.5-25 MCG/ACT AEPB, Take 1 puff by mouth daily., Disp: , Rfl:    empagliflozin (JARDIANCE) 10 MG TABS tablet, Take 1 tablet (10 mg total) by mouth daily., Disp: 90 tablet, Rfl: 0   ticagrelor (BRILINTA) 90 MG TABS tablet, Take 1 tablet (90 mg total) by mouth 2 (two) times daily., Disp: 180 tablet, Rfl: 3  Orders Placed This Encounter  Procedures   CT CHEST WO CONTRAST    There are no Patient Instructions on file for this visit.   --Continue cardiac medications as reconciled in final medication list. --Return in about 3 months (around 09/02/2022) for Follow up, CAD. Or sooner if needed. --Continue follow-up with your primary care physician regarding the management of your other chronic comorbid conditions.  Patient's questions and concerns were addressed to his satisfaction. He voices understanding of the instructions provided during this encounter.  This note was created using a voice recognition software as a result there may be grammatical errors inadvertently enclosed that do not reflect the nature of this encounter. Every attempt is made to correct such errors.  Rex Kras, [place], [facility]  Pager: [phone] Office: [phone]

## 2022-06-09 ENCOUNTER — Other Ambulatory Visit: Payer: Self-pay | Admitting: Urology

## 2022-06-09 DIAGNOSIS — R972 Elevated prostate specific antigen [PSA]: Secondary | ICD-10-CM

## 2022-06-28 ENCOUNTER — Other Ambulatory Visit: Payer: Self-pay | Admitting: Urology

## 2022-06-28 ENCOUNTER — Other Ambulatory Visit (HOSPITAL_COMMUNITY): Payer: Self-pay | Admitting: Urology

## 2022-06-28 ENCOUNTER — Ambulatory Visit
Admission: RE | Admit: 2022-06-28 | Discharge: 2022-06-28 | Disposition: A | Discharge status: Home / Self Care | Payer: Medicare HMO | Source: Ambulatory Visit | Attending: Urology | Admitting: Urology

## 2022-06-28 ENCOUNTER — Telehealth: Payer: Self-pay | Admitting: *Deleted

## 2022-06-28 DIAGNOSIS — R972 Elevated prostate specific antigen [PSA]: Secondary | ICD-10-CM

## 2022-06-28 NOTE — Telephone Encounter (Signed)
error 

## 2022-07-05 ENCOUNTER — Other Ambulatory Visit: Payer: Self-pay

## 2022-07-05 DIAGNOSIS — I214 Non-ST elevation (NSTEMI) myocardial infarction: Secondary | ICD-10-CM

## 2022-07-05 MED ORDER — METOPROLOL SUCCINATE ER 50 MG PO TB24: ORAL_TABLET | ORAL | 1 refills | Status: DC

## 2022-07-07 ENCOUNTER — Other Ambulatory Visit: Payer: Self-pay | Admitting: Cardiology

## 2022-07-11 ENCOUNTER — Other Ambulatory Visit: Payer: Self-pay

## 2022-07-11 DIAGNOSIS — I2510 Atherosclerotic heart disease of native coronary artery without angina pectoris: Secondary | ICD-10-CM

## 2022-07-11 DIAGNOSIS — I214 Non-ST elevation (NSTEMI) myocardial infarction: Secondary | ICD-10-CM

## 2022-07-11 DIAGNOSIS — Z955 Presence of coronary angioplasty implant and graft: Secondary | ICD-10-CM

## 2022-07-11 MED ORDER — TICAGRELOR 90 MG PO TABS: 90.0000 mg | ORAL_TABLET | Freq: Two times a day (BID) | ORAL | 3 refills | Status: DC

## 2022-07-11 MED ORDER — EMPAGLIFLOZIN 10 MG PO TABS: 10.0000 mg | ORAL_TABLET | Freq: Every day | ORAL | 3 refills | Status: DC

## 2022-07-28 ENCOUNTER — Other Ambulatory Visit: Payer: Self-pay | Admitting: Cardiology

## 2022-08-18 ENCOUNTER — Other Ambulatory Visit: Payer: Self-pay | Admitting: Cardiology

## 2022-08-30 ENCOUNTER — Other Ambulatory Visit: Payer: Self-pay

## 2022-08-30 DIAGNOSIS — I2510 Atherosclerotic heart disease of native coronary artery without angina pectoris: Secondary | ICD-10-CM

## 2022-08-30 DIAGNOSIS — I214 Non-ST elevation (NSTEMI) myocardial infarction: Secondary | ICD-10-CM

## 2022-08-30 DIAGNOSIS — Z955 Presence of coronary angioplasty implant and graft: Secondary | ICD-10-CM

## 2022-08-30 MED ORDER — TICAGRELOR 90 MG PO TABS: 90.0000 mg | ORAL_TABLET | Freq: Two times a day (BID) | ORAL | 3 refills | Status: DC

## 2022-09-04 ENCOUNTER — Ambulatory Visit: Payer: Medicare HMO | Admitting: Cardiology

## 2022-09-08 ENCOUNTER — Encounter (HOSPITAL_COMMUNITY): Payer: Self-pay

## 2022-09-08 ENCOUNTER — Inpatient Hospital Stay (HOSPITAL_COMMUNITY)
Admission: EM | Admit: 2022-09-08 | Discharge: 2022-09-11 | DRG: 193 | Disposition: A | Discharge status: Home / Self Care | Payer: Medicare HMO | Source: Ambulatory Visit | Attending: Family Medicine | Admitting: Family Medicine

## 2022-09-08 ENCOUNTER — Ambulatory Visit (INDEPENDENT_AMBULATORY_CARE_PROVIDER_SITE_OTHER): Payer: Medicare HMO

## 2022-09-08 ENCOUNTER — Ambulatory Visit (HOSPITAL_COMMUNITY)
Admission: EM | Admit: 2022-09-08 | Discharge: 2022-09-08 | Disposition: A | Discharge status: Other Acute Inpatient Hospital | Payer: Medicare HMO | Attending: Family Medicine | Admitting: Family Medicine

## 2022-09-08 ENCOUNTER — Other Ambulatory Visit: Payer: Self-pay

## 2022-09-08 DIAGNOSIS — N400 Enlarged prostate without lower urinary tract symptoms: Secondary | ICD-10-CM | POA: Diagnosis present

## 2022-09-08 DIAGNOSIS — J90 Pleural effusion, not elsewhere classified: Secondary | ICD-10-CM | POA: Diagnosis present

## 2022-09-08 DIAGNOSIS — R062 Wheezing: Secondary | ICD-10-CM | POA: Diagnosis not present

## 2022-09-08 DIAGNOSIS — E119 Type 2 diabetes mellitus without complications: Secondary | ICD-10-CM | POA: Diagnosis present

## 2022-09-08 DIAGNOSIS — Z1152 Encounter for screening for COVID-19: Secondary | ICD-10-CM

## 2022-09-08 DIAGNOSIS — Z79899 Other long term (current) drug therapy: Secondary | ICD-10-CM

## 2022-09-08 DIAGNOSIS — J189 Pneumonia, unspecified organism: Secondary | ICD-10-CM

## 2022-09-08 DIAGNOSIS — N179 Acute kidney failure, unspecified: Secondary | ICD-10-CM | POA: Diagnosis present

## 2022-09-08 DIAGNOSIS — J9601 Acute respiratory failure with hypoxia: Secondary | ICD-10-CM | POA: Diagnosis present

## 2022-09-08 DIAGNOSIS — I2510 Atherosclerotic heart disease of native coronary artery without angina pectoris: Secondary | ICD-10-CM | POA: Diagnosis present

## 2022-09-08 DIAGNOSIS — I10 Essential (primary) hypertension: Secondary | ICD-10-CM | POA: Diagnosis present

## 2022-09-08 DIAGNOSIS — Z833 Family history of diabetes mellitus: Secondary | ICD-10-CM

## 2022-09-08 DIAGNOSIS — J440 Chronic obstructive pulmonary disease with acute lower respiratory infection: Secondary | ICD-10-CM | POA: Diagnosis present

## 2022-09-08 DIAGNOSIS — R0902 Hypoxemia: Secondary | ICD-10-CM | POA: Diagnosis not present

## 2022-09-08 DIAGNOSIS — J441 Chronic obstructive pulmonary disease with (acute) exacerbation: Secondary | ICD-10-CM | POA: Diagnosis present

## 2022-09-08 DIAGNOSIS — E860 Dehydration: Secondary | ICD-10-CM | POA: Diagnosis present

## 2022-09-08 DIAGNOSIS — R0600 Dyspnea, unspecified: Secondary | ICD-10-CM

## 2022-09-08 DIAGNOSIS — Z8249 Family history of ischemic heart disease and other diseases of the circulatory system: Secondary | ICD-10-CM

## 2022-09-08 DIAGNOSIS — E785 Hyperlipidemia, unspecified: Secondary | ICD-10-CM | POA: Diagnosis present

## 2022-09-08 DIAGNOSIS — R0602 Shortness of breath: Principal | ICD-10-CM

## 2022-09-08 DIAGNOSIS — Z955 Presence of coronary angioplasty implant and graft: Secondary | ICD-10-CM

## 2022-09-08 LAB — HEMOGLOBIN A1C
Hgb A1c MFr Bld: 6.9 % — ABNORMAL HIGH (ref 4.8–5.6)
Mean Plasma Glucose: 151.33 mg/dL

## 2022-09-08 LAB — CBC WITH DIFFERENTIAL/PLATELET
Abs Immature Granulocytes: 0.05 10*3/uL (ref 0.00–0.07)
Basophils Absolute: 0.1 10*3/uL (ref 0.0–0.1)
Basophils Relative: 1 %
Eosinophils Absolute: 0.4 10*3/uL (ref 0.0–0.5)
Eosinophils Relative: 4 %
HCT: 39.2 % (ref 39.0–52.0)
Hemoglobin: 12.4 g/dL — ABNORMAL LOW (ref 13.0–17.0)
Immature Granulocytes: 1 %
Lymphocytes Relative: 11 %
Lymphs Abs: 1.1 10*3/uL (ref 0.7–4.0)
MCH: 27.9 pg (ref 26.0–34.0)
MCHC: 31.6 g/dL (ref 30.0–36.0)
MCV: 88.3 fL (ref 80.0–100.0)
Monocytes Absolute: 1.4 10*3/uL — ABNORMAL HIGH (ref 0.1–1.0)
Monocytes Relative: 14 %
Neutro Abs: 6.9 10*3/uL (ref 1.7–7.7)
Neutrophils Relative %: 69 %
Platelets: 284 10*3/uL (ref 150–400)
RBC: 4.44 MIL/uL (ref 4.22–5.81)
RDW: 15.5 % (ref 11.5–15.5)
WBC: 9.8 10*3/uL (ref 4.0–10.5)
nRBC: 0 % (ref 0.0–0.2)

## 2022-09-08 LAB — COMPREHENSIVE METABOLIC PANEL
ALT: 15 U/L (ref 0–44)
AST: 15 U/L (ref 15–41)
Albumin: 3.5 g/dL (ref 3.5–5.0)
Alkaline Phosphatase: 54 U/L (ref 38–126)
Anion gap: 9 (ref 5–15)
BUN: 26 mg/dL — ABNORMAL HIGH (ref 8–23)
CO2: 29 mmol/L (ref 22–32)
Calcium: 9.5 mg/dL (ref 8.9–10.3)
Chloride: 100 mmol/L (ref 98–111)
Creatinine, Ser: 1.78 mg/dL — ABNORMAL HIGH (ref 0.61–1.24)
GFR, Estimated: 40 mL/min — ABNORMAL LOW (ref >60)
Glucose, Bld: 112 mg/dL — ABNORMAL HIGH (ref 70–99)
Potassium: 4.1 mmol/L (ref 3.5–5.1)
Sodium: 138 mmol/L (ref 135–145)
Total Bilirubin: 0.4 mg/dL (ref 0.3–1.2)
Total Protein: 7.5 g/dL (ref 6.5–8.1)

## 2022-09-08 LAB — RESP PANEL BY RT-PCR (FLU A&B, COVID) ARPGX2
Influenza A by PCR: NEGATIVE
Influenza B by PCR: NEGATIVE
SARS Coronavirus 2 by RT PCR: NEGATIVE

## 2022-09-08 LAB — GLUCOSE, CAPILLARY: Glucose-Capillary: 191 mg/dL — ABNORMAL HIGH (ref 70–99)

## 2022-09-08 LAB — LIPASE, BLOOD: Lipase: 29 U/L (ref 11–51)

## 2022-09-08 MED ORDER — PREDNISONE 20 MG PO TABS
40.0000 mg | ORAL_TABLET | Freq: Every day | ORAL | Status: DC
  Administered 2022-09-09 – 2022-09-11 (×3): 40 mg via ORAL
  Filled 2022-09-08 (×3): qty 2

## 2022-09-08 MED ORDER — UMECLIDINIUM BROMIDE 62.5 MCG/ACT IN AEPB
1.0000 | INHALATION_SPRAY | Freq: Every day | RESPIRATORY_TRACT | Status: DC
  Administered 2022-09-09 – 2022-09-11 (×3): 1 via RESPIRATORY_TRACT
  Filled 2022-09-08: qty 7

## 2022-09-08 MED ORDER — ASPIRIN 81 MG PO TBEC
81.0000 mg | DELAYED_RELEASE_TABLET | Freq: Every day | ORAL | Status: DC
  Administered 2022-09-09 – 2022-09-11 (×3): 81 mg via ORAL
  Filled 2022-09-08 (×3): qty 1

## 2022-09-08 MED ORDER — SODIUM CHLORIDE 0.9 % IV SOLN
1.0000 g | Freq: Every day | INTRAVENOUS | Status: DC
  Administered 2022-09-09: 1 g via INTRAVENOUS
  Filled 2022-09-08: qty 10

## 2022-09-08 MED ORDER — INSULIN ASPART 100 UNIT/ML IJ SOLN
0.0000 [IU] | Freq: Three times a day (TID) | INTRAMUSCULAR | Status: DC
  Administered 2022-09-10: 2 [IU] via SUBCUTANEOUS
  Administered 2022-09-10: 3 [IU] via SUBCUTANEOUS
  Administered 2022-09-10: 2 [IU] via SUBCUTANEOUS
  Administered 2022-09-11: 3 [IU] via SUBCUTANEOUS
  Administered 2022-09-11: 2 [IU] via SUBCUTANEOUS

## 2022-09-08 MED ORDER — IPRATROPIUM-ALBUTEROL 0.5-2.5 (3) MG/3ML IN SOLN
3.0000 mL | Freq: Once | RESPIRATORY_TRACT | Status: AC
  Administered 2022-09-08: 3 mL via RESPIRATORY_TRACT

## 2022-09-08 MED ORDER — SODIUM CHLORIDE 0.9 % IV SOLN
1.0000 g | Freq: Once | INTRAVENOUS | Status: AC
  Administered 2022-09-08: 1 g via INTRAVENOUS
  Filled 2022-09-08: qty 10

## 2022-09-08 MED ORDER — METOPROLOL SUCCINATE ER 50 MG PO TB24
50.0000 mg | ORAL_TABLET | Freq: Every day | ORAL | Status: DC
  Administered 2022-09-09 – 2022-09-11 (×3): 50 mg via ORAL
  Filled 2022-09-08 (×3): qty 1

## 2022-09-08 MED ORDER — IPRATROPIUM-ALBUTEROL 0.5-2.5 (3) MG/3ML IN SOLN
RESPIRATORY_TRACT | Status: AC
  Filled 2022-09-08: qty 3

## 2022-09-08 MED ORDER — ENOXAPARIN SODIUM 40 MG/0.4ML IJ SOSY
40.0000 mg | PREFILLED_SYRINGE | INTRAMUSCULAR | Status: DC
  Administered 2022-09-08 – 2022-09-11 (×4): 40 mg via SUBCUTANEOUS
  Filled 2022-09-08 (×4): qty 0.4

## 2022-09-08 MED ORDER — ORAL CARE MOUTH RINSE: 15.0000 mL | OROMUCOSAL | Status: DC | PRN

## 2022-09-08 MED ORDER — TAMSULOSIN HCL 0.4 MG PO CAPS
0.8000 mg | ORAL_CAPSULE | Freq: Every day | ORAL | Status: DC
  Administered 2022-09-09 – 2022-09-10 (×2): 0.8 mg via ORAL
  Filled 2022-09-08 (×2): qty 2

## 2022-09-08 MED ORDER — SODIUM CHLORIDE 0.9 % IV SOLN
500.0000 mg | INTRAVENOUS | Status: AC
  Administered 2022-09-08 – 2022-09-10 (×3): 500 mg via INTRAVENOUS
  Filled 2022-09-08 (×3): qty 5

## 2022-09-08 MED ORDER — FLUTICASONE FUROATE-VILANTEROL 100-25 MCG/ACT IN AEPB
1.0000 | INHALATION_SPRAY | Freq: Every day | RESPIRATORY_TRACT | Status: DC
  Administered 2022-09-09 – 2022-09-11 (×3): 1 via RESPIRATORY_TRACT
  Filled 2022-09-08: qty 28

## 2022-09-08 MED ORDER — SODIUM CHLORIDE 0.9 % IV SOLN: INTRAVENOUS | Status: DC

## 2022-09-08 MED ORDER — TICAGRELOR 90 MG PO TABS
90.0000 mg | ORAL_TABLET | Freq: Two times a day (BID) | ORAL | Status: DC
  Administered 2022-09-08 – 2022-09-11 (×6): 90 mg via ORAL
  Filled 2022-09-08 (×6): qty 1

## 2022-09-08 MED ORDER — ROSUVASTATIN CALCIUM 20 MG PO TABS
40.0000 mg | ORAL_TABLET | Freq: Every day | ORAL | Status: DC
  Administered 2022-09-09 – 2022-09-10 (×2): 40 mg via ORAL
  Filled 2022-09-08 (×2): qty 2

## 2022-09-08 MED ORDER — ALBUTEROL SULFATE (2.5 MG/3ML) 0.083% IN NEBU
2.5000 mg | INHALATION_SOLUTION | RESPIRATORY_TRACT | Status: DC | PRN
  Administered 2022-09-10 – 2022-09-11 (×2): 2.5 mg via RESPIRATORY_TRACT
  Filled 2022-09-08 (×2): qty 3

## 2022-09-08 MED ORDER — MELATONIN 3 MG PO TABS
3.0000 mg | ORAL_TABLET | Freq: Every day | ORAL | Status: DC
  Administered 2022-09-08 – 2022-09-10 (×3): 3 mg via ORAL
  Filled 2022-09-08 (×3): qty 1

## 2022-09-08 MED ORDER — GABAPENTIN 300 MG PO CAPS
600.0000 mg | ORAL_CAPSULE | Freq: Three times a day (TID) | ORAL | Status: DC
  Administered 2022-09-08 – 2022-09-11 (×8): 600 mg via ORAL
  Filled 2022-09-08 (×8): qty 2

## 2022-09-08 NOTE — Assessment & Plan Note (Addendum)
Creatinine 1.43 from 1.49 and baseline is 1. Likely prerenal due to dehydration secondary to acute illness.  - s/p IV NS 129mL/hr (ended at 2200 11/18) encouraged better PO fluid intake - Holding losartan, chlorthalidone, and spironolactone in the setting of AKI. - BMP AM

## 2022-09-08 NOTE — ED Triage Notes (Signed)
Pt is here for a cough and SOB X 3days

## 2022-09-08 NOTE — Progress Notes (Signed)
FMTS Interim Progress Note  S: Assessed with Dr. Larita Fife. Patient resting comfortably in room. Reports his breathing is better but still requiring O2. He is not on O2 at baseline.   O: BP (!) 132/92 (BP Location: Right Arm)   Pulse 78   Temp 97.9 F (36.6 C) (Oral)   Resp 20   Ht 5\' 9"  (1.753 m)   Wt 90 kg   SpO2 92%   BMI 29.30 kg/m   Well-appearing, no acute distress Cardio: Regular rate, regular rhythm, no murmurs on exam. Pulm: mild bilateral wheezing in lung bases. No in Abdominal: bowel sounds present, soft, non-tender, non-distended Extremities: no peripheral edema  Neuro: alert and oriented x3, speech normal in content, no facial asymmetry, strength intact and equal bilaterally in UE and LE, pupils equal and reactive to light.   A/P: Acute Hypoxic respiratory Failure:  - continue supportive therapy - wean o2 as able - ambulatory O2 prior to discharge  - will monitor O2 sat overnight  - continue nebulizer as needed   , DO 09/08/2022, 9:20 PM PGY-1, [facility] pager [phone]

## 2022-09-08 NOTE — ED Provider Notes (Signed)
[facility] Provider Note   CSN: [id number] Arrival date & time: 09/08/22  1409     History Chief Complaint  Patient presents with   Shortness of Breath    HPI [facility] is a 71 y.o. male presenting for shortness of breath.  He states that he has been sick for the last 4 days.  He was seen at urgent care today found to be hypoxic in the 70s started on 3 L nasal cannula.  He endorses a relatively minimal medical history.  However on chart review he has a history of hypertension, CAD with recent PCI. Patient denies fevers or chills, nausea or vomiting, syncope or shortness of breath.  No known sick contacts..   Patient's recorded medical, surgical, social, medication list and allergies were reviewed in the Snapshot window as part of the initial history.   Review of Systems   Review of Systems  Constitutional:  Positive for fever. Negative for chills.  HENT:  Negative for ear pain and sore throat.   Eyes:  Negative for pain and visual disturbance.  Respiratory:  Positive for cough and shortness of breath. Negative for choking.   Cardiovascular:  Negative for chest pain and palpitations.  Gastrointestinal:  Negative for abdominal pain and vomiting.  Genitourinary:  Negative for dysuria and hematuria.  Musculoskeletal:  Negative for arthralgias and back pain.  Skin:  Negative for color change and rash.  Neurological:  Negative for seizures and syncope.  All other systems reviewed and are negative.   Physical Exam Updated Vital Signs BP 111/71   Pulse 69   Temp 98 F (36.7 C) (Oral)   Resp 17   Ht 5\' 9"  (1.753 m)   Wt 90 kg   SpO2 100%   BMI 29.30 kg/m  Physical Exam Vitals and nursing note reviewed.  Constitutional:      General: He is not in acute distress.    Appearance: He is well-developed.  HENT:     Head: Normocephalic and atraumatic.  Eyes:     Conjunctiva/sclera: Conjunctivae normal.  Cardiovascular:     Rate and  Rhythm: Normal rate and regular rhythm.     Heart sounds: No murmur heard. Pulmonary:     Effort: Pulmonary effort is normal. Tachypnea present. No respiratory distress.     Breath sounds: Rhonchi present.  Abdominal:     Palpations: Abdomen is soft.     Tenderness: There is no abdominal tenderness.  Musculoskeletal:        General: No swelling.     Cervical back: Neck supple.  Skin:    General: Skin is warm and dry.     Capillary Refill: Capillary refill takes less than 2 seconds.  Neurological:     Mental Status: He is alert.  Psychiatric:        Mood and Affect: Mood normal.      ED Course/ Medical Decision Making/ A&P    Procedures Procedures   Medications Ordered in ED Medications  azithromycin (ZITHROMAX) 500 mg in sodium chloride 0.9 % 250 mL IVPB (has no administration in time range)  cefTRIAXone (ROCEPHIN) 1 g in sodium chloride 0.9 % 100 mL IVPB (1 g Intravenous New Bag/Given 09/08/22 1600)    Medical Decision Making:    [name] is a 71 y.o. male who presented to the ED today with SOB detailed above.     Patient's presentation is complicated by their history of multiple comorbid medical problems.  Patient placed on  continuous vitals and telemetry monitoring while in ED which was reviewed periodically.   Complete initial physical exam performed, notably the patient  was hemodynamically stable in no acute distress.      Reviewed and confirmed nursing documentation for past medical history, family history, social history.    Initial Assessment:   With the patient's presentation of shortness of breath, most likely diagnosis is pneumonia, recurrent. Other diagnoses were considered including (but not limited to) ACS, PE, pneumothorax. These are considered less likely due to history of present illness and physical exam findings.   This is most consistent with an acute life/limb threatening illness complicated by underlying chronic conditions.  Initial Plan:   Screening labs including CBC and Metabolic panel to evaluate for infectious or metabolic etiology of disease.  Urinalysis with reflex culture ordered to evaluate for UTI or relevant urologic/nephrologic pathology.  CXR to evaluate for structural/infectious intrathoracic pathology.  Objective evaluation as below reviewed with plan for close reassessment  Initial Study Results:   Laboratory  All laboratory results reviewed without evidence of clinically relevant pathology.    Radiology  All images reviewed independently. Agree with radiology report at this time.   DG Chest 2 View  Result Date: 09/08/2022 CLINICAL DATA:  Dyspnea, wheezing, hypoxia EXAM: CHEST - 2 VIEW COMPARISON:  Previous studies including chest radiographs done on 04/27 23 and CT done on 02/16/2022 FINDINGS: Transverse diameter of heart is increased. New patchy infiltrates are seen in both lower lung fields, more so on the right side. There are no signs of alveolar pulmonary edema. Small right pleural effusion is seen. There is no pneumothorax. There is gas-filled bowel loop anterior to the stomach seen in the lateral view. IMPRESSION: New patchy infiltrates are seen in both lower lung fields, more so on the right side suggesting atelectasis/pneumonia. Small right pleural effusion. Electronically Signed   By: Ernie Avena M.D.   On: 09/08/2022 12:46     Consults:  Case discussed with Family Medicine.   Final Assessment and Plan:   Patient's history present on his physical exam findings most consistent with pneumonia.  Patient has ceftriaxone and azithromycin and arranged for admission for ongoing care and management.   Disposition:   Based on the above findings, I believe this patient is stable for admission.    Patient/family educated about specific findings on our evaluation and explained exact reasons for admission.  Patient/family educated about clinical situation and time was allowed to answer questions.    Admission team communicated with and agreed with need for admission. Patient admitted. Patient ready to move at this time.     Emergency Department Medication Summary:   Medications  azithromycin (ZITHROMAX) 500 mg in sodium chloride 0.9 % 250 mL IVPB (has no administration in time range)  cefTRIAXone (ROCEPHIN) 1 g in sodium chloride 0.9 % 100 mL IVPB (1 g Intravenous New Bag/Given 09/08/22 1600)         Clinical Impression:  1. SOB (shortness of breath)      Admit   Final Clinical Impression(s) / ED Diagnoses Final diagnoses:  SOB (shortness of breath)    Rx / DC Orders ED Discharge Orders     None         Glyn Ade, MD 09/08/22 [phone]

## 2022-09-08 NOTE — Discharge Instructions (Addendum)
Dear Keitha [place],   Thank you so much for allowing [place] to be part of your care!  You were admitted to [facility] for pneumonia. You were treated with antibiotics and steroids due to your COPD. You were given oxygen and then weaned off of this with improvement.   POST-HOSPITAL & CARE INSTRUCTIONS Continue to drink fluids as you had an acute kidney injury due to dehydration in the hospital Continue your steroids for 3 more days and antibiotics for 2 more days at home Please let PCP/Specialists know of any changes that were made.  Please see medications section of this packet for any medication changes.   DOCTOR'S APPOINTMENT & FOLLOW UP CARE INSTRUCTIONS  Future Appointments  Date Time Provider Department Center  09/29/2022 11:45 AM Tessa Lerner, DO [facility] None    RETURN PRECAUTIONS: Worsening shortness of breath/work of breathing Fevers Chest pain  Take care and be well!  Family Medicine Teaching Service  [place]  [facility]  [address] [place], [place] [address] [phone]

## 2022-09-08 NOTE — Hospital Course (Addendum)
Evan [facility] is a 71 y.o. male with PMHx of COPD, CAD s/p stent, T2DM, HTN, HLD presenting with shortness of breath and nasal congestion admitted for acute respiratory hypoxia secondary to pneumonia. Hospital course is below:  Acute hypoxic respiratory failure Presented with shortness of breath and nasal congestion to urgent care and was transferred via EMS to ED due to oxygen saturations in the low 70s and CXR which revealed new patchy infiltrates in the lower lung fields bilaterally and small right sided pulmonary effusion. At baseline, the patient is not on home oxygen, but was started on 3 L O2 nasal cannula to improve his respiratory status. Continued monitoring his respiratory status and started Breo Ellipta, Incruse Ellipta, and Albuterol. Will wean off oxygen as able  Acute Kidney Injury Creatinine on admission was 1.78 and at baseline is 1.0. Likely prerenal due to dehydration secondary to his pneumonia. Patient was put on IV normal saline at 75 mL/hr and his home Losartan and Metformin were held. Morning CMP show ***

## 2022-09-08 NOTE — Discharge Instructions (Addendum)
Patient has pneumonia and secondary hypoxia. Hypoxia did not improve with duoneb. Has h/o COPD

## 2022-09-08 NOTE — Assessment & Plan Note (Addendum)
Patient suffering from shortness of breath for the past three days and was diagnosed with pneumonia at Urgent Care this morning. The patient has been using his Trelegy, Albuterol inhaler more frequently, as well as an Albuterol nebulizer. His symptoms are most likely exacerbated due to his CXR that showed new patchy infiltrates in bilateral lower lung fields, more so on the right side suggesting pneumonia along with a small right pleural effusion. At urgent care he required oxygen via nasal cannula due to having 88% O2 saturation which worsened while being transported to the ED via EMS in the low 70s. In the ED, the patient is on 3 L O2 nasal cannula with O2 saturation at 99% and is on no oxygen at baseline. On exam, he had decreased breath sounds, especially in the right lower lobe. - Admit to FMTS, attending Dr. Manson Passey - Med-tele, vitals per routine - Wean off oxygen as able - Continue monitoring respiratory status, consider CTA if condition worsens. - Continue IV Ceftriaxone and Azithromycin -  prednisone 40 mg (day 1/5) - Blood culture pending - Breo Ellipta QD - Incruse Ellipta QD - Albuterol PRN - Consider pulmonary consult -  PT/OT eval & treat

## 2022-09-08 NOTE — ED Provider Notes (Signed)
[facility]    CSN: [id number] Arrival date & time: 09/08/22  1005      History   Chief Complaint Chief Complaint  Patient presents with   Cough   Shortness of Breath    HPI [facility] is a 71 y.o. male.    Cough Associated symptoms: shortness of breath   Shortness of Breath Associated symptoms: cough    Here for shortness of breath and cough and nasal congestion.  Symptoms began on November 14.  He has not had any fever or chills, and no nausea or vomiting or diarrhea.  No sore throat or ear pain.  He has been using his rescue inhaler and he does an albuterol nebulization twice daily and last one was about 4 or 5 hours ago.  That medication has been helping a little when he uses it  He also uses Trelegy daily  Past Medical History:  Diagnosis Date   Arthritis    "hips" (10/01/2013)   Asthma    COPD (chronic obstructive pulmonary disease) (HCC)    Coronary artery disease    Exertional shortness of breath    Hyperlipidemia    Hypertension     Patient Active Problem List   Diagnosis Date Noted   Status post coronary artery stent placement    S/P PTCA (percutaneous transluminal coronary angioplasty)    Non-insulin dependent type 2 diabetes mellitus ([place])    Mixed hyperlipidemia    Coronary atherosclerosis due to calcified coronary lesion    Former smoker    Class 1 obesity due to excess calories with serious comorbidity and body mass index (BMI) of 31.0 to 31.9 in adult    Non-ST elevation (NSTEMI) myocardial infarction ([place]) 02/16/2022   NSTEMI (non-ST elevated myocardial infarction) ([place]) 02/16/2022   Acute respiratory failure with hypoxia ([place]) 11/12/2021   CAP (community acquired pneumonia) 02/16/2021   Hypoxia 02/16/2021   COPD with acute exacerbation ([place]) 02/16/2021   Diabetes mellitus type 2 in obese ([place]) 02/16/2021   AKI (acute kidney injury) ([place]) 02/16/2021   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   Leukocytosis 10/04/2015    COPD (chronic obstructive pulmonary disease) ([place]) 10/01/2015   Essential hypertension, benign 03/01/2015   Asthma with acute exacerbation 01/26/2015   Thrombocytopenia, unspecified ([place]) 11/06/2013   Acute respiratory failure ([place]) with hypoxia 10/01/2013   Acute renal failure ([place]) 10/01/2013    Past Surgical History:  Procedure Laterality Date   CORONARY STENT INTERVENTION N/A 02/16/2022   Procedure: CORONARY STENT INTERVENTION;  Surgeon: Nigel Mormon, MD;  Location: [facility];  Service: Cardiovascular;  Laterality: N/A;   CORONARY THROMBECTOMY N/A 02/16/2022   Procedure: Coronary Thrombectomy;  Surgeon: Nigel Mormon, MD;  Location: [facility];  Service: Cardiovascular;  Laterality: N/A;   JOINT REPLACEMENT     LEFT HEART CATH AND CORONARY ANGIOGRAPHY N/A 02/16/2022   Procedure: LEFT HEART CATH AND CORONARY ANGIOGRAPHY;  Surgeon: Nigel Mormon, MD;  Location: [facility];  Service: Cardiovascular;  Laterality: N/A;   TOTAL HIP ARTHROPLASTY Right 2010   TOTAL HIP ARTHROPLASTY Left 08/19/2013   Procedure: TOTAL HIP ARTHROPLASTY ANTERIOR APPROACH;  Surgeon: Hessie Dibble, MD;  Location: [facility];  Service: Orthopedics;  Laterality: Left;       Home Medications    Prior to Admission medications   Medication Sig Start Date End Date Taking? Authorizing Provider  albuterol (PROVENTIL) (2.5 MG/3ML) 0.083% nebulizer solution Take 3 mLs (2.5 mg total) by nebulization every  4 (four) hours as needed for wheezing or shortness of breath. 11/14/21   Patrecia Pour, MD  albuterol (VENTOLIN HFA) 108 (90 Base) MCG/ACT inhaler INHALE 1 PUFF FOUR TIMES DAILY, AS NEEDED Patient taking differently: 2 puffs every 4 (four) hours as needed for wheezing or shortness of breath. 10/26/21 10/26/22  Raspet, Derry Skill, PA-C  aspirin 81 MG EC tablet Take 1 tablet (81 mg total) by mouth daily. Swallow whole. 02/17/22   Patwardhan, Reynold Bowen, MD  Blood Pressure Monitor KIT 1 kit by  Does not apply route 3 (three) times daily as needed. 11/05/19   Kerin Perna, NP  Budeson-Glycopyrrol-Formoterol (BREZTRI AEROSPHERE) 160-9-4.8 MCG/ACT AERO Inhale 2 puffs into the lungs in the morning and at bedtime. 05/19/20   Collene Gobble, MD  Cholecalciferol (VITAMIN D3) 25 MCG (1000 UT) CAPS Take 1 capsule by mouth daily.    [provider]  empagliflozin (JARDIANCE) 10 MG TABS tablet Take 1 tablet (10 mg total) by mouth daily. 07/11/22 07/06/23  Tolia, Sunit, DO  gabapentin (NEURONTIN) 800 MG tablet Take 800 mg by mouth 3 (three) times daily. 02/01/22   [provider]  HYDROcodone-acetaminophen (NORCO) 10-325 MG tablet Take 1 tablet by mouth every 6 (six) hours as needed for moderate pain or severe pain. 02/14/21   [provider]  losartan (COZAAR) 100 MG tablet TAKE 1 TABLET(100 MG) BY MOUTH DAILY 07/28/22   Tolia, Sunit, DO  metFORMIN (GLUCOPHAGE) 500 MG tablet Take 1 tablet (500 mg total) by mouth 2 (two) times daily with a meal. Resume on Sunday 02/19/2022 02/17/22   Patwardhan, Reynold Bowen, MD  metoprolol succinate (TOPROL-XL) 50 MG 24 hr tablet TAKE 1 TABLET(50 MG) BY MOUTH EVERY MORNING WITH OR IMMEDIATELY FOLLOWING A MEAL 07/05/22   Tolia, Sunit, DO  nitroGLYCERIN (NITROSTAT) 0.4 MG SL tablet Place 1 tablet (0.4 mg total) under the tongue every 5 (five) minutes as needed for chest pain. 02/17/22 06/02/22  Patwardhan, Reynold Bowen, MD  Prenatal Vit-Fe Fumarate-FA (PRENATAL VITAMIN PLUS LOW IRON) 27-1 MG TABS Take 27 mg by mouth daily.    [provider]  rosuvastatin (CRESTOR) 40 MG tablet TAKE 1 TABLET BY MOUTH AT BEDTIME 07/07/22   Tolia, Sunit, DO  spironolactone (ALDACTONE) 25 MG tablet TAKE 1 TABLET(25 MG) BY MOUTH DAILY AT 10 PM 08/18/22   Tolia, Sunit, DO  tamsulosin (FLOMAX) 0.4 MG CAPS capsule Take 0.8 mg by mouth daily after supper. 11/09/21   [provider]  ticagrelor (BRILINTA) 90 MG TABS tablet Take 1 tablet (90 mg total) by mouth 2 (two)  times daily. 08/30/22 08/30/23  Tolia, Sunit, DO  tiZANidine (ZANAFLEX) 4 MG tablet Take 4 mg by mouth every 8 (eight) hours as needed for muscle spasms.    [provider]  Donnal Debar 100-62.5-25 MCG/ACT AEPB Take 1 puff by mouth daily. 12/21/21   [provider]    Family History Family History  Problem Relation Age of Onset   Diabetes Mother    Hypertension Father    Lupus Sister     Social History Social History   Tobacco Use   Smoking status: Former    Packs/day: 0.00    Years: 0.00    Total pack years: 0.00    Types: Cigarettes    Quit date: 10/23/1990    Years since quitting: 31.8   Smokeless tobacco: Never   Tobacco comments:    10/01/2013 "quit smoking cigarettes~ 15-20 yr ago"  Vaping Use   Vaping  Use: Never used  Substance Use Topics   Alcohol use: Yes    Alcohol/week: 3.0 standard drinks of alcohol    Types: 3 Cans of beer per week    Comment: 6pack in 2 days   Drug use: Not Currently    Types: Marijuana    Comment: 10/01/2013 "last drug use was 10 yr ago"     Allergies   Patient has no known allergies.   Review of Systems Review of Systems  Respiratory:  Positive for cough and shortness of breath.      Physical Exam Triage Vital Signs ED Triage Vitals  Enc Vitals Group     BP 09/08/22 1147 115/73     Pulse Rate 09/08/22 1147 81     Resp 09/08/22 1147 16     Temp 09/08/22 1147 98.8 F (37.1 C)     Temp Source 09/08/22 1147 Oral     SpO2 09/08/22 1147 (!) 88 %     Weight --      Height --      Head Circumference --      Peak Flow --      Pain Score 09/08/22 1146 0     Pain Loc --      Pain Edu? --      Excl. in [place]? --    No data found.  Updated Vital Signs BP 115/73 (BP Location: Left Arm)   Pulse 81   Temp 98.8 F (37.1 C) (Oral)   Resp 16   SpO2 92%   Visual Acuity Right Eye Distance:   Left Eye Distance:   Bilateral Distance:    Right Eye Near:   Left Eye Near:    Bilateral Near:     Physical  Exam Vitals reviewed.  Constitutional:      General: He is not in acute distress.    Appearance: He is not ill-appearing, toxic-appearing or diaphoretic.     Comments: Saturation on room air is 88%, and staff placed him on oxygen by nasal cannula  HENT:     Nose: Congestion present.     Mouth/Throat:     Mouth: Mucous membranes are moist.     Comments: There is clear mucus draining Eyes:     Extraocular Movements: Extraocular movements intact.     Conjunctiva/sclera: Conjunctivae normal.     Pupils: Pupils are equal, round, and reactive to light.  Cardiovascular:     Rate and Rhythm: Normal rate and regular rhythm.     Heart sounds: No murmur heard. Pulmonary:     Breath sounds: No stridor.     Comments: There are low pitched rhonchi on expiration. Musculoskeletal:     Cervical back: Neck supple.  Lymphadenopathy:     Cervical: No cervical adenopathy.  Skin:    Capillary Refill: Capillary refill takes less than 2 seconds.     Coloration: Skin is not jaundiced or pale.  Neurological:     General: No focal deficit present.     Mental Status: He is alert and oriented to person, place, and time.  Psychiatric:        Behavior: Behavior normal.      UC Treatments / Results  Labs (all labs ordered are listed, but only abnormal results are displayed) Labs Reviewed - No data to display  EKG   Radiology DG Chest 2 View  Result Date: 09/08/2022 CLINICAL DATA:  Dyspnea, wheezing, hypoxia EXAM: CHEST - 2 VIEW COMPARISON:  Previous studies including chest radiographs done  on 04/27 23 and CT done on 02/16/2022 FINDINGS: Transverse diameter of heart is increased. New patchy infiltrates are seen in both lower lung fields, more so on the right side. There are no signs of alveolar pulmonary edema. Small right pleural effusion is seen. There is no pneumothorax. There is gas-filled bowel loop anterior to the stomach seen in the lateral view. IMPRESSION: New patchy infiltrates are seen in  both lower lung fields, more so on the right side suggesting atelectasis/pneumonia. Small right pleural effusion. Electronically Signed   By: Elmer Picker M.D.   On: 09/08/2022 12:46    Procedures Procedures (including critical care time)  Medications Ordered in UC Medications  ipratropium-albuterol (DUONEB) 0.5-2.5 (3) MG/3ML nebulizer solution 3 mL (3 mLs Nebulization Given 09/08/22 1211)    Initial Impression / Assessment and Plan / UC Course  I have reviewed the triage vital signs and the nursing notes.  Pertinent labs & imaging results that were available during my care of the patient were reviewed by me and considered in my medical decision making (see chart for details).       After DuoNeb treatment, patient did feel better.  However, on room air he was still at 86%.  Oxygen was put back on and chest x-ray is done  Chest x-ray shows new patchy infiltrates in the lower lung fields bilaterally.  With his hypoxia despite his breathing treatment and the pneumonia, we are going to call for transport to the emergency room for further evaluation and probable admission Final Clinical Impressions(s) / UC Diagnoses   Final diagnoses:  Community acquired pneumonia, unspecified laterality  Hypoxia     Discharge Instructions      Patient has pneumonia and secondary hypoxia. Hypoxia did not improve with duoneb. Has h/o COPD     ED Prescriptions   None    PDMP not reviewed this encounter.   Barrett Henle, MD 09/08/22 1254

## 2022-09-08 NOTE — ED Triage Notes (Signed)
Patient arrives from [facility] care. SOB x3 days with productive cough. CXR at urgent care showed pneumonia. Patient on 3 liters at 98%.

## 2022-09-08 NOTE — Progress Notes (Signed)
   FMTS Attending Admission Note: Evan Starr, MD   For questions about this patient, please use [email or web address] to page the family medicine resident on call. Pager number [phone].    I  have seen and examined this patient, reviewed their chart. I have discussed this patient with the resident. Will sign resident note as available.   71 yo with history of CAD s/p LCx stent,  type 2 DM, HTN, HLD and COPD presenting with dyspnea and cough consistent with community acquired pneumonia. Reports congestion, productive cough, and dyspnea on exertion for 3 days. Denies chest pressure, LE edema, weight gain. Denies sick contacts. He does have a history of COPD and reports compliance with Trelegy.   PMH CAD- s/p stent LCx stent (DES) in April 2021  HTN HLD  Type 2 DM COPD  PSH- reviewed Family history- Diabetes, HTN Socially: retired, engaged, enjoys watching TV   CXR reviewed, EKG ordered  Labs reviewed--notable elevated creatinine and mild anemia   Vitals- notable for oxygen of 86% [facility]  Cardiac: Regular rate and rhythm. Normal S1/S2. No murmurs, rubs, or gallops appreciated. Lungs: Coarse breath sounds in bilateral lung bases, + crackles on L  Abdomen: Normoactive bowel sounds. No tenderness to deep or light palpation. No rebound or guarding.   Psych: Pleasant and appropriate  LE: No edema    Acute hypoxemic respiratory failure, likely cause pneumonia, also considered COPD exacerbation (abnormal CXR makes this less likely), PE (no tachycardia or edema). Will continue Ceftriaxone/Azithromycin. Continue inhalers, monitor respiratory status. . Obtain obtain EKG. Wean oxygen as able.  CAD- Continue ticagrelor, ASA, statin, beta-blocker.  Will sign resident note as available.

## 2022-09-08 NOTE — H&P (Addendum)
Hospital Admission History and Physical Service Pager: [phone]  Patient name: Evan Brock Medical record number: [id number] Date of Birth: Oct 19, 1951 Age: 71 y.o. Gender: male  Primary Care Provider: Frederich Chick., MD Consultants: None Code Status: FULL  Preferred Emergency Contact: Lucile Shutters [phone])  Chief Complaint: Shortness of breath x 3 days  Assessment and Plan: Evan Brock is a 71 y.o. male with PMHx of COPD, CAD s/p stent, T2DM, HTN, HLD presenting with shortness of breath and nasal congestion that began on 09/05/22.   Differential for this patient's presentation of this includes acute hypoxic respiratory failure secondary to PNA. COPD exacerbation (compliant with meds, no longer a smoker, CXR showing PNA), PE (VSS, no tachycardia, edema) and upper respiratory infection (no recent exposures to sick contacts and pending respiratory panel).   * Acute hypoxic respiratory failure (HCC) Patient suffering from shortness of breath for the past three days and was diagnosed with pneumonia at Urgent Care this morning. The patient has been using his Trelegy, Albuterol inhaler more frequently, as well as an Albuterol nebulizer. His symptoms are most likely exacerbated due to his CXR that showed new patchy infiltrates in bilateral lower lung fields, more so on the right side suggesting pneumonia along with a small right pleural effusion. At urgent care he required oxygen via nasal cannula due to having 88% O2 saturation which worsened while being transported to the ED via EMS in the low 70s. In the ED, the patient is on 3 L O2 nasal cannula with O2 saturation at 99% and is on no oxygen at baseline. On exam, he had decreased breath sounds, especially in the right lower lobe. - Admit to FMTS, attending Dr. Manson Passey - Med-tele, vitals per routine - Wean off oxygen as able - Continue monitoring respiratory status, consider CTA if condition worsens. - Continue IV Ceftriaxone and  Azithromycin -  prednisone 40 mg (day 1/5) - Blood culture pending - Breo Ellipta QD - Incruse Ellipta QD - Albuterol PRN - Consider pulmonary consult -  PT/OT eval & treat  Acute kidney injury (HCC) Patient has been short of breath for the past three days. He is chronically on some nephrotoxic agents which could have also impacted his current condition. Denies any changes in urination or bowel movements. Creatinine on admission is 1.78 and baseline is 1. Likely prerenal due to dehydration secondary to acute illness.  - Initiate NS at 75 mL/hr for 8 hours - Held Losartan and Metformin. - Obtain CBC and CMP and Magnesium in the AM  Other Chronic Conditions T2DM - SSI and CBG checks Hypertension- BP normotensive upon admission, hold losartan in the setting of AKI. Monitor.  CAD- Continue statin and ticagrelor.   FEN/GI: Heart Healthy/Carb Modified VTE Prophylaxis: Lovenox  Disposition: med-tele, attending Dr. Manson Passey   History of Present Illness:  Evan Brock is a 71 y.o. male presenting with shortness of breath and nasal congestion since 09/05/22. He has been experience some dark gray sputum and shortness of breath that is common for him, but has been exacerbated recently especially on exertion. Patient denies any fevers, chills, nausea, vomiting, diarrhea, sore throat, or sick contacts. He has tried to manage his symptoms using his Trelegy daily, albuterol inhaler twice, and albuterol nebulization twice daily. His last nebulizer treatment was at 8 AM this morning. He is not on oxygen at baseline. Patient presented to urgent care this morning like he has before to receive steroids however, he was placed on a oxygen and  given a Duoneb which helped improve his symptoms but he was saturating at 86% on room air. He was put back on oxygen and an XR was done which showed new patchy infiltrates in the lower lung fields bilaterally and he was transported to the ED. EMS reported that he was saturating  in the low 70s and was put on 3 L O2 in the ED and saturating at 99% now. ED started him on IV Ceftriaxone and Azithromycin. Patient also reports having pneumonia a few months ago and had similar symptoms for which he has received steroids in the past which is what prompted him to go to urgent care. His care team includes a pulmonologist, cardiologist, and PCP.   Review Of Systems:  General: Negative for fever and chills. HEENT: Positive for mucus congestion. Negative for sore throat. CV: Negative for chest pain and chest tightness. Pulm: Positive for cough and SOB. GI: Negative for  N/V/D. GU: No changes in urination or bowel movements.  Pertinent Past Medical History: CAP COPD Asthma Non-insulin dependent T2DM HTN HLD CAD Acute Respiratory Failure with Hypoxia Asbestos exposure   Remainder reviewed in history tab.   Pertinent Past Surgical History: Coronary Stent Intervention - 02/16/22 Coronary Thrombectomy - 02/16/22 Bilateral hip replacements  Remainder reviewed in history tab.   Pertinent Social History: Tobacco use: Former Alcohol use: No Other Substance use: Marijuana 10 years ago, not currently Lives with his fiance. Retired. Previously worked as a Nurse, children's.   Pertinent Family History: Mother - Diabetes Father - Hypertension Sister - Lupus  Remainder reviewed in history tab.   Important Outpatient Medications:  Albuterol (Proventil) Albuterol (Ventolin HFA) Trelegy Aspirin 81mg  Losartan Metformin Metoprolol  Spironolactone Rosuvastatin Tamsulosin Brilinta Zanaflex Vitamin D  Remainder reviewed in medication history.   Objective: BP 108/78   Pulse 72   Temp 98.3 F (36.8 C) (Oral)   Resp 18   Ht 5\' 9"  (1.753 m)   Wt 90 kg   SpO2 99%   BMI 29.30 kg/m   Exam: General: NAD, comfortably laying in bed Cardiovascular: RRR. No m/r/g, no evidence of JVD Respiratory: pulmonary effort normal on O2. Decreased breath sounds,  primarily in right lower lobe. Gastrointestinal: soft, nontender, nondistended and bowel sounds present MSK: Able to move all extremities. No swelling in extremities.  Neuro: A&Ox3. Normal sensation. Psych: mood stable.  Labs:  CBC BMET  Recent Labs  Lab 09/08/22 1420  WBC 9.8  HGB 12.4*  HCT 39.2  PLT 284   Recent Labs  Lab 09/08/22 1420  NA 138  K 4.1  CL 100  CO2 29  BUN 26*  CREATININE 1.78*  GLUCOSE 112*  CALCIUM 9.5    Pertinent additional labs  EKG: Pending  Imaging Studies Performed:  Imaging Study (ie. Chest x-ray) Impression from Radiologist: New patchy infiltrates are seen in both lower lung fields, more so on the right side suggesting atelectasis/pneumonia. Small right pleural effusion.  My Interpretation: Patchy infiltrate visible in bilateral lower lungs fields. Pleural effusion at the right costophrenic angle.    09/10/22, Medical Student 09/08/2022, 6:31 PM [facility]  I was personally present and performed or re-performed the history, physical exam and medical decision making activities of this service and have verified that the service and findings are accurately documented in the student's note. My edits are noted within the note above. Please also see attending's attestation.   Velora Heckler, DO  09/08/2022, 7:00 PM  PGY-3, [facility]   FPTS Intern pager: [phone], text pages welcome Secure chat group [facility] Teaching Service

## 2022-09-09 DIAGNOSIS — E860 Dehydration: Secondary | ICD-10-CM | POA: Diagnosis present

## 2022-09-09 DIAGNOSIS — I2510 Atherosclerotic heart disease of native coronary artery without angina pectoris: Secondary | ICD-10-CM | POA: Diagnosis present

## 2022-09-09 DIAGNOSIS — J189 Pneumonia, unspecified organism: Secondary | ICD-10-CM | POA: Diagnosis present

## 2022-09-09 DIAGNOSIS — J440 Chronic obstructive pulmonary disease with acute lower respiratory infection: Secondary | ICD-10-CM | POA: Diagnosis present

## 2022-09-09 DIAGNOSIS — Z1152 Encounter for screening for COVID-19: Secondary | ICD-10-CM | POA: Diagnosis not present

## 2022-09-09 DIAGNOSIS — Z833 Family history of diabetes mellitus: Secondary | ICD-10-CM | POA: Diagnosis not present

## 2022-09-09 DIAGNOSIS — J9601 Acute respiratory failure with hypoxia: Secondary | ICD-10-CM | POA: Diagnosis present

## 2022-09-09 DIAGNOSIS — N400 Enlarged prostate without lower urinary tract symptoms: Secondary | ICD-10-CM | POA: Diagnosis present

## 2022-09-09 DIAGNOSIS — Z955 Presence of coronary angioplasty implant and graft: Secondary | ICD-10-CM | POA: Diagnosis not present

## 2022-09-09 DIAGNOSIS — Z8249 Family history of ischemic heart disease and other diseases of the circulatory system: Secondary | ICD-10-CM | POA: Diagnosis not present

## 2022-09-09 DIAGNOSIS — J90 Pleural effusion, not elsewhere classified: Secondary | ICD-10-CM | POA: Diagnosis present

## 2022-09-09 DIAGNOSIS — N179 Acute kidney failure, unspecified: Secondary | ICD-10-CM | POA: Diagnosis present

## 2022-09-09 DIAGNOSIS — Z79899 Other long term (current) drug therapy: Secondary | ICD-10-CM | POA: Diagnosis not present

## 2022-09-09 DIAGNOSIS — E119 Type 2 diabetes mellitus without complications: Secondary | ICD-10-CM | POA: Diagnosis present

## 2022-09-09 DIAGNOSIS — E785 Hyperlipidemia, unspecified: Secondary | ICD-10-CM | POA: Diagnosis present

## 2022-09-09 DIAGNOSIS — J441 Chronic obstructive pulmonary disease with (acute) exacerbation: Secondary | ICD-10-CM | POA: Diagnosis present

## 2022-09-09 DIAGNOSIS — I10 Essential (primary) hypertension: Secondary | ICD-10-CM | POA: Diagnosis present

## 2022-09-09 LAB — COMPREHENSIVE METABOLIC PANEL
ALT: 16 U/L (ref 0–44)
AST: 14 U/L — ABNORMAL LOW (ref 15–41)
Albumin: 3 g/dL — ABNORMAL LOW (ref 3.5–5.0)
Alkaline Phosphatase: 47 U/L (ref 38–126)
Anion gap: 11 (ref 5–15)
BUN: 20 mg/dL (ref 8–23)
CO2: 28 mmol/L (ref 22–32)
Calcium: 9.5 mg/dL (ref 8.9–10.3)
Chloride: 99 mmol/L (ref 98–111)
Creatinine, Ser: 1.49 mg/dL — ABNORMAL HIGH (ref 0.61–1.24)
GFR, Estimated: 50 mL/min — ABNORMAL LOW (ref >60)
Glucose, Bld: 122 mg/dL — ABNORMAL HIGH (ref 70–99)
Potassium: 4.2 mmol/L (ref 3.5–5.1)
Sodium: 138 mmol/L (ref 135–145)
Total Bilirubin: 0.4 mg/dL (ref 0.3–1.2)
Total Protein: 6.6 g/dL (ref 6.5–8.1)

## 2022-09-09 LAB — CBC
HCT: 35.9 % — ABNORMAL LOW (ref 39.0–52.0)
Hemoglobin: 11.5 g/dL — ABNORMAL LOW (ref 13.0–17.0)
MCH: 27.3 pg (ref 26.0–34.0)
MCHC: 32 g/dL (ref 30.0–36.0)
MCV: 85.1 fL (ref 80.0–100.0)
Platelets: 266 10*3/uL (ref 150–400)
RBC: 4.22 MIL/uL (ref 4.22–5.81)
RDW: 14.7 % (ref 11.5–15.5)
WBC: 9.8 10*3/uL (ref 4.0–10.5)
nRBC: 0 % (ref 0.0–0.2)

## 2022-09-09 LAB — GLUCOSE, CAPILLARY
Glucose-Capillary: 173 mg/dL — ABNORMAL HIGH (ref 70–99)
Glucose-Capillary: 116 mg/dL — ABNORMAL HIGH (ref 70–99)
Glucose-Capillary: 204 mg/dL — ABNORMAL HIGH (ref 70–99)
Glucose-Capillary: 147 mg/dL — ABNORMAL HIGH (ref 70–99)

## 2022-09-09 LAB — MAGNESIUM: Magnesium: 2.1 mg/dL (ref 1.7–2.4)

## 2022-09-09 MED ORDER — LACTATED RINGERS IV SOLN: INTRAVENOUS | Status: AC

## 2022-09-09 NOTE — Progress Notes (Addendum)
Daily Progress Note Intern Pager: [phone]  Patient name: Evan Brock Medical record number: [id number] Date of birth: [birth year] Age: 71 y.o. Gender: male  Primary Care Provider: Frederich Chick., MD Consultants: None Code Status: FULL  Pt Overview and Major Events to Date:  11/17 - Admitted  Assessment and Plan: Evan Brock is a 71 y.o. male with PMHx of COPD, CAD s/p stent, T2DM, HTN, HLD admitted for acute hypoxic respiratory failure secondary to pneumonia and AKI.   * Acute hypoxic respiratory failure (HCC) secondary to Community Acquired Pneumonia  Patient discontinued off 2 L [place] and is saturating at 96% on room air and is comfortable, not on oxygen at baseline. With PT, his spO2 dropped to 86%, but recovered quickly to 92% within one minute with pursed lip breathing. Still experiencing some shortness of breath. and is on no oxygen at baseline. On exam, he had decreased breath sounds, especially in the right lower lobe. - Med-tele, vitals per routine - Wean off oxygen as able, with goal 88-92% given COPD  - Continue monitoring respiratory status, consider CTA if condition worsens. - Continue IV Ceftriaxone and Azithromycin - Prednisone 40 mg (day 1/5) - Blood culture pending - Breo Ellipta QD - Incruse Ellipta QD - Albuterol PRN - Consider pulmonary consult - No PT follow up recommended outpatient  Acute kidney injury (HCC) Creatinine on admission is 1.78, improved to 1.49 and baseline is 1. Normal saline at 75 mL/hr was given for 8 hours. Likely prerenal due to dehydration secondary to acute illness.  - Administer IV NS 141mL/hr and encourage better PO fluid intake - Held losartan, chlorthalidone, and spironolactone in the setting of AKI. - Repeat BMP in AM  Other Chronic Conditions T2DM - SSI and CBG checks. Restart Jardiance upon AKI improval. Hypertension - BP normotensive upon admission, hold losartan, chlorthalidone, and spironolactone in the setting of  AKI. Monitor. Restart medications upon AKI improval. CAD - Continue statin and ticagrelor.  BPH- Tamsulosin   FEN/GI: Heart healthy/Carb modified PPx: Lovenox Dispo: pending clinical improvement, prior to D/C home  Subjective:  Patient is doing well and has no concerns over night. His shortness of breath, cough, and mucus production has improved since admission. Denies any fevers, chills, worsening shortness of breath, cough, N/V/D, or chest pain. Patient is sitting comfortably in bed and is off the [place].  Objective: Temp:  [97.9 F (36.6 C)-98.7 F (37.1 C)] 98.7 F (37.1 C) (11/18 0823) Pulse Rate:  [69-85] 77 (11/18 0823) Resp:  [14-21] 16 (11/18 0823) BP: (103-151)/(60-92) 119/78 (11/18 0823) SpO2:  [81 %-100 %] 97 % (11/18 0845) Weight:  [87.1 kg-90 kg] 87.1 kg (11/18 0500)  Physical Exam: General: NAD. Sitting comfortably in bed. Cardiovascular: RRR. No M/R/G Respiratory: Diminished breath sounds. Wheezing present bilaterally in upper lobes. Abdomen: Soft, nontender, nondistended. Bowel sounds present.  Extremities: No edema, swelling, or weakness. Able to move all extremities. Psych: mood stable   Laboratory: Most recent CBC Lab Results  Component Value Date   WBC 9.8 09/09/2022   HGB 11.5 (L) 09/09/2022   HCT 35.9 (L) 09/09/2022   MCV 85.1 09/09/2022   PLT 266 09/09/2022   Most recent BMP    Latest Ref Rng & Units 09/09/2022    5:15 AM  BMP  Glucose 70 - 99 mg/dL 383   BUN 8 - 23 mg/dL 20   Creatinine 2.91 - 1.24 mg/dL 9.16   Sodium 606 - 004 mmol/L 138   Potassium 3.5 -  5.1 mmol/L 4.2   Chloride 98 - 111 mmol/L 99   CO2 22 - 32 mmol/L 28   Calcium 8.9 - 10.3 mg/dL 9.5     Other pertinent labs Magnesium - 2.1   Imaging/Diagnostic Tests: EKG - sinus rhythm, rate 71, QTc 399 No other new imaging since admission.  Velora Heckler, Medical Student 09/09/2022, 12:40 PM  I was personally present and re-performed the exam. I verified the service and  findings are accurately documented in the student's note.  Jerre Simon, MD 09/09/2022 6:49 PM   [facility] FPTS Intern pager: [phone], text pages welcome Secure chat group [facility] Teaching Service

## 2022-09-09 NOTE — Evaluation (Signed)
Physical Therapy Evaluation Patient Details Name: Haley Roza MRN: [id number] DOB: 11-07-1950 Today's Date: 09/09/2022  History of Present Illness  71 y/o male presented to ED on 09/08/22 for cough and SOB x 3 days. CXR showed pneumonia. Admitted for acute hypoxic respiratory failure. PMH: COPD, CAD s/p stent, T2DM, HTN  Clinical Impression  Patient admitted with the above. PTA, patient lives with wife, children, and grandchildren and was completely independent. Patient presents with decreased activity tolerance. Ambulated independently in hallway on RA with drop in spO2 to 86% but quick recovery to 92% within 1 minute with instruction on pursed lip breathing. Educated on energy conservation techniques and incentive spirometer use, patient verbalized and demonstrated understanding. No further skilled PT needs identified acutely. No PT follow up recommended at this time. Will defer further mobility to mobility specialists and nursing staff.        Recommendations for follow up therapy are one component of a multi-disciplinary discharge planning process, led by the attending physician.  Recommendations may be updated based on patient status, additional functional criteria and insurance authorization.  Follow Up Recommendations No PT follow up      Assistance Recommended at Discharge PRN  Patient can return home with the following  Assistance with cooking/housework    Equipment Recommendations None recommended by PT  Recommendations for Other Services       Functional Status Assessment Patient has had a recent decline in their functional status and demonstrates the ability to make significant improvements in function in a reasonable and predictable amount of time.     Precautions / Restrictions Precautions Precautions: None Precaution Comments: watch O2 Restrictions Weight Bearing Restrictions: No      Mobility  Bed Mobility               General bed mobility comments: in  recliner on arrival    Transfers Overall transfer level: Independent Equipment used: None                    Ambulation/Gait Ambulation/Gait assistance: Independent Gait Distance (Feet): 120 Feet Assistive device: None Gait Pattern/deviations: WFL(Within Functional Limits) Gait velocity: decreased     General Gait Details: amulated on RA with drop in spO2 to 86% but able to recover within 1 minute to 92% with instruction of pursed lip breathing  Stairs            Wheelchair Mobility    Modified Rankin (Stroke Patients Only)       Balance Overall balance assessment: No apparent balance deficits (not formally assessed)                                           Pertinent Vitals/Pain Pain Assessment Pain Assessment: No/denies pain    Home Living Family/patient expects to be discharged to:: Private residence Living Arrangements: Spouse/significant other Available Help at Discharge: Family;Available 24 hours/day Type of Home: House Home Access: Level entry     Alternate Level Stairs-Number of Steps: 4 + 4 (split level home) Home Layout: Multi-level Home Equipment: Cane - single Librarian, academic (2 wheels);BSC/3in1      Prior Function Prior Level of Function : Independent/Modified Independent                     Hand Dominance        Extremity/Trunk Assessment   Upper Extremity Assessment Upper Extremity Assessment:  Overall [facility] for tasks assessed    Lower Extremity Assessment Lower Extremity Assessment: Overall WFL for tasks assessed    Cervical / Trunk Assessment Cervical / Trunk Assessment: Normal  Communication   Communication: No difficulties  Cognition Arousal/Alertness: Awake/alert Behavior During Therapy: WFL for tasks assessed/performed Overall Cognitive Status: Within Functional Limits for tasks assessed                                          General Comments      Exercises      Assessment/Plan    PT Assessment Patient does not need any further PT services  PT Problem List         PT Treatment Interventions      PT Goals (Current goals can be found in the Care Plan section)  Acute Rehab PT Goals Patient Stated Goal: to breathe better PT Goal Formulation: All assessment and education complete, DC therapy    Frequency       Co-evaluation               AM-PAC PT "6 Clicks" Mobility  Outcome Measure Help needed turning from your back to your side while in a flat bed without using bedrails?: None Help needed moving from lying on your back to sitting on the side of a flat bed without using bedrails?: None Help needed moving to and from a bed to a chair (including a wheelchair)?: None Help needed standing up from a chair using your arms (e.g., wheelchair or bedside chair)?: None Help needed to walk in hospital room?: None Help needed climbing 3-5 steps with a railing? : None 6 Click Score: 24    End of Session   Activity Tolerance: Patient tolerated treatment well Patient left: in chair;with call bell/phone within reach;with family/visitor present Nurse Communication: Mobility status PT Visit Diagnosis: Muscle weakness (generalized) (M62.81)    Time: 6734-1937 PT Time Calculation (min) (ACUTE ONLY): 17 min   Charges:   PT Evaluation $PT Eval Low Complexity: 1 Low          Jsaon Yoo A. Dan Humphreys PT, DPT Acute Rehabilitation Services Office [phone]   Viviann Spare 09/09/2022, 9:57 AM

## 2022-09-09 NOTE — Progress Notes (Addendum)
FMTS Attending Daily Note: [name], MD  Team Pager [phone] Pager [phone]  I have seen and examined this patient, reviewed their chart. I have discussed this patient with the resident physician.  Addendums to below note include:  Community acquired pneumonia- continues to need O2 with ambulation, does well at rest, will monitor and see if improved tomorrow   Hypertension- hold losartan/spironolactone/chlorthalidone   Type 2 DM- hold metformin   I agree with the remainder of the findings, exam, and plan below.   Disposition: possibly home today pending improvement         Daily Progress Note Intern Pager: [phone]  Patient name: Evan Brock Medical record number: [id number] Date of birth: 1951-01-01 Age: 71 y.o. Gender: male  Primary Care Provider: Sherald Hess., MD Consultants: None Code Status: Full  Pt Overview and Major Events to Date:  11/17 admitted 11/18 back on RA Assessment and Plan:  Evan Brock is a 71 y.o. male with PMHx of COPD, CAD s/p stent, T2DM, HTN, HLD presenting with shortness of breath and nasal congestion that began on 09/05/22.    * Acute hypoxic respiratory failure ([place]) D/t CAP. Does have COPD at baseline. On RA since 11/18 AM. Blood cultures NGTD. - Goal O2 sat 88-92% - Continue monitoring respiratory status, consider CTA if condition worsens. - Continue IV Azt (Day 3/3), CTX (Day 3/5) consider transition to PO abx today  - Prednisone 40 mg (day 2/5) - ambulate with pulse ox again - Breo Ellipta QD - Incruse Ellipta QD - Albuterol PRN  Acute kidney injury (HCC) Creatinine 1.43 from 1.49 and baseline is 1. Likely prerenal due to dehydration secondary to acute illness.  - s/p IV NS 141mL/hr (ended at 2200 11/18) encouraged better PO fluid intake - Holding losartan, chlorthalidone, and spironolactone in the setting of AKI. - BMP   FEN/GI: Heart healthy/carb modified PPx: Lovenox Dispo:Home likely today or tomorrow pending  clinical status  Subjective:  Patient says he is feeling okay he continues to have a cough but overall doing well in terms of his breathing.  Is continued to be on room air overnight.  He says that he has been drinking water every night as well.   Objective: Temp:  [98 F (36.7 C)-98.7 F (37.1 C)] 98 F (36.7 C) (11/19 0458) Pulse Rate:  [70-83] 71 (11/19 0458) Resp:  [16-17] 17 (11/19 0458) BP: (107-119)/(74-78) 111/78 (11/19 0458) SpO2:  [93 %-98 %] 94 % (11/19 0458) Weight:  [86.3 kg] 86.3 kg (11/18 2101) Physical Exam: General: NAD, laying in bed comfortably, alert and responsive to all questions Cardiovascular: RRR, no murmurs rubs or gallops Respiratory: Diffuse wheezes throughout anterior and posterior lung fields, no crackles or decreased breath sounds on examination, no retractions or increased work of breathing on room air Abdomen: Soft, nontender to palpation, normoactive bowel sounds Extremities: No lower extremity edema on examination, no calf tenderness  Laboratory: Most recent CBC Lab Results  Component Value Date   WBC 11.9 (H) 09/10/2022   HGB 12.3 (L) 09/10/2022   HCT 38.8 (L) 09/10/2022   MCV 85.5 09/10/2022   PLT 290 09/10/2022   Most recent BMP    Latest Ref Rng & Units 09/10/2022    3:33 AM  BMP  Glucose 70 - 99 mg/dL 133   BUN 8 - 23 mg/dL 22   Creatinine 0.61 - 1.24 mg/dL 1.43   Sodium 135 - 145 mmol/L 137   Potassium 3.5 - 5.1 mmol/L 4.1   Chloride  98 - 111 mmol/L 100   CO2 22 - 32 mmol/L 26   Calcium 8.9 - 10.3 mg/dL 9.8     Levin Erp, MD 09/10/2022, 6:46 AM  PGY-2, [facility] FPTS Intern pager: [phone], text pages welcome Secure chat group [facility] Teaching Service

## 2022-09-09 NOTE — Evaluation (Addendum)
Occupational Therapy Evaluation Patient Details Name: Evan Brock MRN: [id number] DOB: [birth year] Today's Date: 09/09/2022   History of Present Illness 71 y/o male presented to ED on 09/08/22 for cough and SOB x 3 days. CXR showed pneumonia. Admitted for acute hypoxic respiratory failure. PMH: COPD, CAD s/p stent, T2DM, HTN   Clinical Impression   Pt reports independence at baseline with ADLs and functional mobility, lives with spouse/family who can assist at d/c. Pt completing LB dressing, toilet transfer, standing grooming task, and tub transfer during session with supervision, SpO2 down to 89% on RA and 1/4 DOE with mobility, increasing to low 90's within a few minutes with seated rest break. Pt endorsing increased SOB with reaching toward feet, educated on figure 4 technique along with energy conservation strategies (handout provided). Pt presenting with impairments listed below, however has no acute OT needs at this time. Will s/o, please reconsult if there is a change in pt status. Recommend d/c home with family assistance.      Recommendations for follow up therapy are one component of a multi-disciplinary discharge planning process, led by the attending physician.  Recommendations may be updated based on patient status, additional functional criteria and insurance authorization.   Follow Up Recommendations  No OT follow up     Assistance Recommended at Discharge PRN  Patient can return home with the following Assistance with cooking/housework;Help with stairs or ramp for entrance    Functional Status Assessment  Patient has had a recent decline in their functional status and demonstrates the ability to make significant improvements in function in a reasonable and predictable amount of time.  Equipment Recommendations  None recommended by OT    Recommendations for Other Services PT consult     Precautions / Restrictions Precautions Precautions: None Precaution Comments:  watch O2 Restrictions Weight Bearing Restrictions: No      Mobility Bed Mobility Overal bed mobility: Modified Independent                  Transfers Overall transfer level: Modified independent Equipment used: None                      Balance Overall balance assessment: Mild deficits observed, not formally tested                                         ADL either performed or assessed with clinical judgement   ADL Overall ADL's : At baseline                                       General ADL Comments: pt performing LB dressing, standing grooming task, toilet transfer, and tub transfer with supervision     Vision   Vision Assessment?: No apparent visual deficits     Perception Perception Perception Tested?: No   Praxis Praxis Praxis tested?: Not tested    Pertinent Vitals/Pain Pain Assessment Pain Assessment: No/denies pain     Hand Dominance     Extremity/Trunk Assessment Upper Extremity Assessment Upper Extremity Assessment: Overall WFL for tasks assessed   Lower Extremity Assessment Lower Extremity Assessment: Defer to PT evaluation   Cervical / Trunk Assessment Cervical / Trunk Assessment: Normal   Communication Communication Communication: No difficulties   Cognition Arousal/Alertness: Awake/alert Behavior During Therapy: [facility] for  tasks assessed/performed Overall Cognitive Status: Within Functional Limits for tasks assessed                                       General Comments  SpO2 89% at lowest with in room mobility, pt with 1/4 DOE    Exercises     Shoulder Instructions      Home Living Family/patient expects to be discharged to:: Private residence Living Arrangements: Spouse/significant other Available Help at Discharge: Family;Available 24 hours/day Type of Home: House Home Access: Stairs to enter [employer] of Steps: 5   Home Layout:  Multi-level Alternate Level Stairs-Number of Steps: 4 + 4 Alternate Level Stairs-Rails: Right Bathroom Shower/Tub: Tub/shower unit;Curtain   Firefighter: Standard Bathroom Accessibility: Yes   Home Equipment: Cane - single Librarian, academic (2 wheels);BSC/3in1          Prior Functioning/Environment Prior Level of Function : Independent/Modified Independent;Driving             Mobility Comments: no AD use ADLs Comments: ind; does yard work        OT Problem List: Decreased activity tolerance;Cardiopulmonary status limiting activity      OT Treatment/Interventions:      OT Goals(Current goals can be found in the care plan section) Acute Rehab OT Goals Patient Stated Goal: none stated OT Goal Formulation: With patient Time For Goal Achievement: 09/23/22 Potential to Achieve Goals: Good  OT Frequency:      Co-evaluation              AM-PAC OT "6 Clicks" Daily Activity     Outcome Measure Help from another person eating meals?: None Help from another person taking care of personal grooming?: None Help from another person toileting, which includes using toliet, bedpan, or urinal?: None Help from another person bathing (including washing, rinsing, drying)?: A Little Help from another person to put on and taking off regular upper body clothing?: None Help from another person to put on and taking off regular lower body clothing?: A Little 6 Click Score: 22   End of Session Nurse Communication: Mobility status  Activity Tolerance: Patient tolerated treatment well Patient left: in bed;with call bell/phone within reach  OT Visit Diagnosis: Unsteadiness on feet (R26.81);Other abnormalities of gait and mobility (R26.89);Muscle weakness (generalized) (M62.81)                Time: 4627-0350 OT Time Calculation (min): 22 min Charges:  OT General Charges $OT Visit: 1 Visit OT Evaluation $OT Eval Low Complexity: 1 Low  Arron Mcnaught K, OTD, OTR/L SecureChat  Preferred Acute Rehab [phone]   Carver Fila Koonce 09/09/2022, 4:14 PM

## 2022-09-09 NOTE — Progress Notes (Incomplete)
     Daily Progress Note Intern Pager: [phone]  Patient name: Evan Brock Medical record number: [id number] Date of birth: 1951-01-28 Age: 71 y.o. Gender: male  Primary Care Provider: Frederich Chick., MD Consultants: *** Code Status: ***  Pt Overview and Major Events to Date:  ***  Assessment and Plan:  Pasco Marchitto is a 71 y.o. male with PMHx of COPD, CAD s/p stent, T2DM, HTN, HLD presenting with shortness of breath and nasal congestion that began on 09/05/22.    * Acute hypoxic respiratory failure (HCC) Patient discontinued off 2 L [place] and is saturating at 96% on room air and is comfortable, not on oxygen at baseline. With PT, his spO2 dropped to 86%, but recovered quickly to 92% within one minute with pursed lip breathing. Still experiencing some shortness of breath. and is on no oxygen at baseline. On exam, he had decreased breath sounds, especially in the right lower lobe. - Med-tele, vitals per routine - Wean off oxygen as able, with goal >92% - Continue monitoring respiratory status, consider CTA if condition worsens. - Continue IV Ceftriaxone and Azithromycin - Prednisone 40 mg (day 1/5) - Blood culture pending - Breo Ellipta QD - Incruse Ellipta QD - Albuterol PRN - Consider pulmonary consult - No PT follow up recommended outpatient  Acute kidney injury (HCC) Creatinine on admission is 1.78, improved to 1.49 and baseline is 1. Normal saline at 75 mL/hr was given for 8 hours. Likely prerenal due to dehydration secondary to acute illness.  - Administer IV NS 149mL/hr and encourage better PO fluid intake - Held losartan, chlorthalidone, and spironolactone in the setting of AKI. - Repeat BMP in AM       FEN/GI: *** PPx: *** Dispo:{FPTSDISOLIST:27587} {FPTSDISOTIME:27588}. Barriers include ***.   Subjective:  ***  Objective: Temp:  [98 F (36.7 C)-98.7 F (37.1 C)] 98.3 F (36.8 C) (11/18 2101) Pulse Rate:  [70-83] 70 (11/18 2101) Resp:  [16-18] 17  (11/18 2101) BP: (103-119)/(60-78) 107/76 (11/18 2101) SpO2:  [93 %-98 %] 98 % (11/18 2101) Weight:  [86.3 kg-87.1 kg] 86.3 kg (11/18 2101) Physical Exam: General: *** Cardiovascular: *** Respiratory: *** Abdomen: *** Extremities: ***  Laboratory: Most recent CBC Lab Results  Component Value Date   WBC 9.8 09/09/2022   HGB 11.5 (L) 09/09/2022   HCT 35.9 (L) 09/09/2022   MCV 85.1 09/09/2022   PLT 266 09/09/2022   Most recent BMP    Latest Ref Rng & Units 09/09/2022    5:15 AM  BMP  Glucose 70 - 99 mg/dL 147   BUN 8 - 23 mg/dL 20   Creatinine 8.29 - 1.24 mg/dL 5.62   Sodium 130 - 865 mmol/L 138   Potassium 3.5 - 5.1 mmol/L 4.2   Chloride 98 - 111 mmol/L 99   CO2 22 - 32 mmol/L 28   Calcium 8.9 - 10.3 mg/dL 9.5     Other pertinent labs ***   Levin Erp, MD 09/09/2022, 11:55 PM  PGY-2, [facility] FPTS Intern pager: [phone], text pages welcome Secure chat group [facility] Teaching Service

## 2022-09-09 NOTE — Progress Notes (Signed)
Mobility Specialist Progress Note:   09/09/22 1539  Mobility  Activity Ambulated with assistance in hallway  Level of Assistance Standby assist, set-up cues, supervision of patient - no hands on  Assistive Device None  Distance Ambulated (ft) 100 ft  Activity Response Tolerated well  Mobility Referral Yes  $Mobility charge 1 Mobility   Pre-mobility: 92% SpO2 During mobility: 88% SpO2 Post-mobility: 92% SpO2  Pt received sitting EOB and agreeable. C/o SOB during ambulation. Coached through pursed lip breathing. Pt left in bed with all needs met and call bell in reach.   Andrey Campanile Mobility Specialist Please contact via SecureChat or  Rehab office at [phone]

## 2022-09-10 DIAGNOSIS — N179 Acute kidney failure, unspecified: Secondary | ICD-10-CM

## 2022-09-10 DIAGNOSIS — J9601 Acute respiratory failure with hypoxia: Secondary | ICD-10-CM | POA: Diagnosis not present

## 2022-09-10 LAB — CBC
HCT: 38.8 % — ABNORMAL LOW (ref 39.0–52.0)
Hemoglobin: 12.3 g/dL — ABNORMAL LOW (ref 13.0–17.0)
MCH: 27.1 pg (ref 26.0–34.0)
MCHC: 31.7 g/dL (ref 30.0–36.0)
MCV: 85.5 fL (ref 80.0–100.0)
Platelets: 290 10*3/uL (ref 150–400)
RBC: 4.54 MIL/uL (ref 4.22–5.81)
RDW: 14.5 % (ref 11.5–15.5)
WBC: 11.9 10*3/uL — ABNORMAL HIGH (ref 4.0–10.5)
nRBC: 0 % (ref 0.0–0.2)

## 2022-09-10 LAB — COMPREHENSIVE METABOLIC PANEL
ALT: 16 U/L (ref 0–44)
AST: 15 U/L (ref 15–41)
Albumin: 3.1 g/dL — ABNORMAL LOW (ref 3.5–5.0)
Alkaline Phosphatase: 49 U/L (ref 38–126)
Anion gap: 11 (ref 5–15)
BUN: 22 mg/dL (ref 8–23)
CO2: 26 mmol/L (ref 22–32)
Calcium: 9.8 mg/dL (ref 8.9–10.3)
Chloride: 100 mmol/L (ref 98–111)
Creatinine, Ser: 1.43 mg/dL — ABNORMAL HIGH (ref 0.61–1.24)
GFR, Estimated: 52 mL/min — ABNORMAL LOW (ref >60)
Glucose, Bld: 133 mg/dL — ABNORMAL HIGH (ref 70–99)
Potassium: 4.1 mmol/L (ref 3.5–5.1)
Sodium: 137 mmol/L (ref 135–145)
Total Bilirubin: 0.3 mg/dL (ref 0.3–1.2)
Total Protein: 6.8 g/dL (ref 6.5–8.1)

## 2022-09-10 LAB — GLUCOSE, CAPILLARY
Glucose-Capillary: 124 mg/dL — ABNORMAL HIGH (ref 70–99)
Glucose-Capillary: 136 mg/dL — ABNORMAL HIGH (ref 70–99)
Glucose-Capillary: 159 mg/dL — ABNORMAL HIGH (ref 70–99)
Glucose-Capillary: 176 mg/dL — ABNORMAL HIGH (ref 70–99)

## 2022-09-10 MED ORDER — SODIUM CHLORIDE 0.9 % IV SOLN
1.0000 g | Freq: Every day | INTRAVENOUS | Status: AC
  Administered 2022-09-10: 1 g via INTRAVENOUS
  Filled 2022-09-10: qty 10

## 2022-09-10 MED ORDER — CEFDINIR 300 MG PO CAPS
300.0000 mg | ORAL_CAPSULE | Freq: Two times a day (BID) | ORAL | Status: DC
  Administered 2022-09-11: 300 mg via ORAL
  Filled 2022-09-10 (×2): qty 1

## 2022-09-10 NOTE — Progress Notes (Signed)
SATURATION QUALIFICATIONS: (This note is used to comply with regulatory documentation for home oxygen)  Patient Saturations on Room Air at Rest = 91%  Patient Saturations on Room Air while Ambulating = 86%  Patient Saturations on 3 Liters of oxygen while Ambulating = 87%

## 2022-09-10 NOTE — Discharge Summary (Incomplete)
[facility] Discharge Summary  Patient name: Evan Brock Medical record number: [id number] Date of birth: 1951/05/01 Age: 71 y.o. Gender: male Date of Admission: 09/08/2022  Date of Discharge: 09/11/22 Admitting Physician: Martyn Malay, MD  Primary Care Provider: Sherald Hess., MD Consultants: PT/OT  Indication for Hospitalization: Acute respiratory failure 2/2 CAP   Discharge Diagnoses/Problem List:  Principal Problem for Admission: Acute respiratory failure 2/2 CAP  Other Problems addressed during stay:  Principal Problem:   Acute hypoxic respiratory failure ([place]) Active Problems:   Acute kidney injury ([place]) Hypertension COPD in exacerbation  CAD s/p stent   Brief Hospital Course:  Evan Brock is a 71 y.o. male with PMHx of COPD, CAD s/p stent, T2DM, HTN, HLD presenting with shortness of breath and nasal congestion admitted for acute respiratory hypoxia secondary to pneumonia. Hospital course is below:  Acute hypoxic respiratory failure Presented with shortness of breath and nasal congestion to urgent care and was transferred via EMS to ED due to oxygen saturations in the low 70s and CXR which revealed new patchy infiltrates in the lower lung fields bilaterally and small right sided pulmonary effusion. At baseline, the patient is not on home oxygen, but was started on 3 L O2 nasal cannula to improve his respiratory status. Continued monitoring his respiratory status and started Breo Ellipta, Incruse Ellipta, and Albuterol. Treated for CAP with Ceftriaxone and Azithromycin, and COPD exacerbation with Prednisone as well as inhalers. At discharge was saturating at 95% on room air while ambulating without assistance and no distress. O2 sat 94-97% at rest. Discharged patient on 1.5 days of Cefdinir and 2 days of Prednisone to complete regimen.  Acute Kidney Injury Creatinine on admission was 1.78, but at baseline is 1.1. Likely prerenal due to  dehydration secondary to his pneumonia. Patient was put on IV fluids. Discharge creatinine was 1.42. Encouraged fluids at home. Held Losartan, Chlorthalidone, Jardiance, and Spironolactone at discharge due to AKI.  Disposition: Home  Discharge Condition: Stable  Issues for Follow Up:  Please make sure patient has completed his course of Prednisone and Cefdinir. Holding losartan, chlorthalidone, jardiance and spironolactone at discharge due to AKI, please consider restarting when appropriate after reassess BMP Clarify which inhaler patient is taking Breztri vs. Trelegy.  Patient seems to have poor medication adherence and is taking some of his medications differently from how it was prescribed. Consider reconciling home medications to clarify what patient should or should not be taking and how. BMP and CBC at PCP recheck Repeat Chest x-ray in 6 weeks   Discharge Exam:  Vitals:   09/10/22 2113 09/11/22 0447  BP: 107/76 117/82  Pulse: 69 65  Resp: 18   Temp: 98.1 F (36.7 C) 98 F (36.7 C)  SpO2: 96% 97%   Physical Exam: General: Alert. NAD. Sitting comfortably. Cardiovascular: RRR. No M/R/G. Respiratory: Wheezing diffusely. No crackles.  Abdomen: Soft, nontender, nondistended. Bowel sounds present. Extremities: No edema, swelling, or weakness. Able to move all extremities.  Significant Procedures: none  Significant Labs and Imaging:  Recent Labs  Lab 09/10/22 0333 09/11/22 0329  WBC 11.9* 11.6*  HGB 12.3* 12.3*  HCT 38.8* 38.9*  PLT 290 321   Recent Labs  Lab 09/10/22 0333 09/11/22 0329  NA 137 137  K 4.1 3.9  CL 100 99  CO2 26 29  GLUCOSE 133* 115*  BUN 22 26*  CREATININE 1.43* 1.42*  CALCIUM 9.8 9.9  ALKPHOS 49  --   AST 15  --  ALT 16  --   ALBUMIN 3.1*  --     Pertinent Imaging  CXR - 09/03/22 New patchy infiltrates are seen in both lower lung fields, more so on the right side suggesting atelectasis/pneumonia. Small right pleural effusion.  EKG -  09/09/22 Sinus rhythm with occasional Premature ventricular complexes and Premature atrial complexes Otherwise normal ECG  Results/Tests Pending at Time of Discharge: none  Discharge Medications:  Allergies as of 09/11/2022   No Known Allergies      Medication List     STOP taking these medications    chlorthalidone 25 MG tablet Commonly known as: HYGROTON   empagliflozin 10 MG Tabs tablet Commonly known as: Jardiance   HYDROcodone-acetaminophen 10-325 MG tablet Commonly known as: NORCO   losartan 100 MG tablet Commonly known as: COZAAR   Prenatal Vitamin Plus Low Iron 27-1 MG Tabs   spironolactone 25 MG tablet Commonly known as: ALDACTONE   tiZANidine 4 MG tablet Commonly known as: ZANAFLEX   Vitamin D3 25 MCG (1000 UT) Caps       TAKE these medications    albuterol 108 (90 Base) MCG/ACT inhaler Commonly known as: VENTOLIN HFA INHALE 1 PUFF FOUR TIMES DAILY, AS NEEDED What changed:  how much to take when to take this reasons to take this   albuterol (2.5 MG/3ML) 0.083% nebulizer solution Commonly known as: PROVENTIL Take 3 mLs (2.5 mg total) by nebulization every 4 (four) hours as needed for wheezing or shortness of breath. What changed: Another medication with the same name was changed. Make sure you understand how and when to take each.   aspirin EC 81 MG tablet Take 1 tablet (81 mg total) by mouth daily. Swallow whole.   Blood Pressure Monitor Kit 1 kit by Does not apply route 3 (three) times daily as needed.   Breztri Aerosphere 160-9-4.8 MCG/ACT Aero Generic drug: Budeson-Glycopyrrol-Formoterol Inhale 2 puffs into the lungs in the morning and at bedtime.   cefdinir 300 MG capsule Commonly known as: OMNICEF Take 1 capsule (300 mg total) by mouth every 12 (twelve) hours for 3 doses.   gabapentin 800 MG tablet Commonly known as: NEURONTIN Take 800 mg by mouth 3 (three) times daily.   metFORMIN 500 MG tablet Commonly known as:  GLUCOPHAGE Take 1 tablet (500 mg total) by mouth 2 (two) times daily with a meal. Resume on Sunday 02/19/2022   metoprolol succinate 50 MG 24 hr tablet Commonly known as: TOPROL-XL TAKE 1 TABLET(50 MG) BY MOUTH EVERY MORNING WITH OR IMMEDIATELY FOLLOWING A MEAL What changed:  how much to take how to take this when to take this additional instructions   naloxone 4 MG/0.1ML Liqd nasal spray kit Commonly known as: NARCAN CALL 911. SPR CONTENTS OF ONE SPRAYER (0.1ML) INTO ONE NOSTRIL. REPEAT IN 2-3 MIN IF SYMPTOMS OF OPIOID EMERGENCY PERSIST, ALTERNATE NOSTRILS   nitroGLYCERIN 0.4 MG SL tablet Commonly known as: NITROSTAT Place 1 tablet (0.4 mg total) under the tongue every 5 (five) minutes as needed for chest pain.   predniSONE 20 MG tablet Commonly known as: DELTASONE Take 2 tablets (40 mg total) by mouth daily with breakfast for 2 days.   rosuvastatin 40 MG tablet Commonly known as: CRESTOR TAKE 1 TABLET BY MOUTH AT BEDTIME   tamsulosin 0.4 MG Caps capsule Commonly known as: FLOMAX Take 0.8 mg by mouth daily after supper.   ticagrelor 90 MG Tabs tablet Commonly known as: Brilinta Take 1 tablet (90 mg total) by mouth 2 (two) times  daily.   Trelegy Ellipta 100-62.5-25 MCG/ACT Aepb Generic drug: Fluticasone-Umeclidin-Vilant Take 1 puff by mouth daily.   vitamin B-12 50 MCG tablet Commonly known as: CYANOCOBALAMIN Take 50 mcg by mouth daily.       Discharge Instructions: Please refer to Patient Instructions section of EMR for full details.  Patient was counseled important signs and symptoms that should prompt return to medical care, changes in medications, dietary instructions, activity restrictions, and follow up appointments.   Follow-Up Appointments:  Follow-up Information     Connect with your PCP/Specialist as discussed. Schedule an appointment as soon as possible for a visit .   Contact information: [email or web address] Call our physician  referral line at [phone].        Sherald Hess., MD. Schedule an appointment as soon as possible for a visit.   Specialty: Family Medicine Contact information: [place] [address] [phone]                 Britt Bolognese, Medical Student 09/11/2022, 10:25 AM [facility]   I was personally present and re-performed the exam. I verified the service and findings are accurately documented in the student's note.  Alen Bleacher, MD 09/11/2022 12:34 PM

## 2022-09-10 NOTE — Progress Notes (Signed)
Mobility Specialist Progress Note:   09/10/22 1520  Mobility  Activity Ambulated with assistance in hallway  Level of Assistance Standby assist, set-up cues, supervision of patient - no hands on  Assistive Device None  Distance Ambulated (ft) 100 ft  Activity Response Tolerated well  Mobility Referral Yes  $Mobility charge 1 Mobility   During mobility: 89% SpO2 Post-mobility: 92% SpO2  Pt received in chair and agreeable. No complaints. Pt left in chair with all needs met, call bell in reach, and RN in room.   Andrey Campanile Mobility Specialist Please contact via SecureChat or  Rehab office at [phone]

## 2022-09-10 NOTE — Progress Notes (Addendum)
     Daily Progress Note Intern Pager: [phone]  Patient name: Evan Brock Medical record number: [id number] Date of birth: [birth year] Age: 71 y.o. Gender: male  Primary Care Provider: Frederich Chick., MD Consultants: PT/OT Code Status: FULL  Pt Overview and Major Events to Date:  11/17 - Admitted  Assessment and Plan: Evan Brock is a 71 y.o. male with PMHx of COPD, CAD s/p stent, T2DM, HTN, HLD admitted for acute hypoxic respiratory failure secondary to pneumonia and AKI.   * Acute hypoxic respiratory failure (HCC) D/t CAP and has COPD at baseline. On RA since 11/18 AM. Blood cultures NGTD. Improvement in SOB and coughing. No wheezing on exam, but crackles heard in RLL. Difficulty maintaining goal O2 with ambulation.  - Goal O2 sat 88-92% - Continue monitoring respiratory status, consider repeat imaging, perhaps CT or dose of furosemide  - Continue IV Azt (Day 3/3), CTX (Day 3/5) consider transition to PO Cefdinir tomorrow  - Prednisone 40 mg (day 2/5) - Ambulate with pulse ox again this PM - Breo Ellipta QD - Incruse Ellipta QD - Albuterol PRN  Acute kidney injury (HCC) Creatinine 1.43 from 1.49 and baseline is 1. Likely prerenal due to dehydration secondary to acute illness.  - s/p IV NS 170mL/hr (ended at 2200 11/18) encouraged better PO fluid intake - Holding losartan, chlorthalidone, and spironolactone in the setting of AKI. - BMP AM  CAD - Continue ticagrelor, crestor, and metoprolol    FEN/GI: Heart Healthy/Carb Modified PPx: Lovenox Dispo: discharge home pending clinical improvement  Subjective:  Patient is doing well and has no concerns overnight. Fiance at bedside. Patient still endorses some shortness of breath with ambulation, but none at rest. His coughing has improved as well. Additionally, he expressed some low back pain, but thinks its moreso from the bed. Denies chest pain, N/V/D, headache, dizziness, or leg swelling.   Objective: Temp:  [97.9  F (36.6 C)-98.3 F (36.8 C)] 97.9 F (36.6 C) (11/19 0937) Pulse Rate:  [70-83] 82 (11/19 0937) Resp:  [17-18] 18 (11/19 0937) BP: (107-119)/(74-78) 119/76 (11/19 0937) SpO2:  [93 %-98 %] 94 % (11/19 0937) Weight:  [86.3 kg] 86.3 kg (11/18 2101) Physical Exam: General: Alert. NAD. Sitting comfortably at bedside with his fiance. Cardiovascular: RRR. No M/R/G. Respiratory: Breath sounds have improved. No wheezing. Crackles heard in RLL.  Abdomen: Soft, nontender, nondistended. Bowel sounds present. Extremities: No edema, swelling, or weakness. Able to move all extremities.  Laboratory: Most recent CBC Lab Results  Component Value Date   WBC 11.9 (H) 09/10/2022   HGB 12.3 (L) 09/10/2022   HCT 38.8 (L) 09/10/2022   MCV 85.5 09/10/2022   PLT 290 09/10/2022   Most recent BMP    Latest Ref Rng & Units 09/10/2022    3:33 AM  BMP  Glucose 70 - 99 mg/dL 419   BUN 8 - 23 mg/dL 22   Creatinine 3.79 - 1.24 mg/dL 0.24   Sodium 097 - 353 mmol/L 137   Potassium 3.5 - 5.1 mmol/L 4.1   Chloride 98 - 111 mmol/L 100   CO2 22 - 32 mmol/L 26   Calcium 8.9 - 10.3 mg/dL 9.8     Other pertinent labs - none  Imaging/Diagnostic Tests: No new imaging.   Velora Heckler, Medical Student 09/10/2022, 12:15 PM  [facility] FPTS Intern pager: [phone], text pages welcome Secure chat group [facility] Teaching Service

## 2022-09-11 ENCOUNTER — Other Ambulatory Visit (HOSPITAL_COMMUNITY): Payer: Self-pay

## 2022-09-11 LAB — CBC
HCT: 38.9 % — ABNORMAL LOW (ref 39.0–52.0)
Hemoglobin: 12.3 g/dL — ABNORMAL LOW (ref 13.0–17.0)
MCH: 26.2 pg (ref 26.0–34.0)
MCHC: 31.6 g/dL (ref 30.0–36.0)
MCV: 82.9 fL (ref 80.0–100.0)
Platelets: 321 10*3/uL (ref 150–400)
RBC: 4.69 MIL/uL (ref 4.22–5.81)
RDW: 14.5 % (ref 11.5–15.5)
WBC: 11.6 10*3/uL — ABNORMAL HIGH (ref 4.0–10.5)
nRBC: 0 % (ref 0.0–0.2)

## 2022-09-11 LAB — BASIC METABOLIC PANEL
Anion gap: 9 (ref 5–15)
BUN: 26 mg/dL — ABNORMAL HIGH (ref 8–23)
CO2: 29 mmol/L (ref 22–32)
Calcium: 9.9 mg/dL (ref 8.9–10.3)
Chloride: 99 mmol/L (ref 98–111)
Creatinine, Ser: 1.42 mg/dL — ABNORMAL HIGH (ref 0.61–1.24)
GFR, Estimated: 53 mL/min — ABNORMAL LOW (ref >60)
Glucose, Bld: 115 mg/dL — ABNORMAL HIGH (ref 70–99)
Potassium: 3.9 mmol/L (ref 3.5–5.1)
Sodium: 137 mmol/L (ref 135–145)

## 2022-09-11 LAB — GLUCOSE, CAPILLARY
Glucose-Capillary: 130 mg/dL — ABNORMAL HIGH (ref 70–99)
Glucose-Capillary: 129 mg/dL — ABNORMAL HIGH (ref 70–99)

## 2022-09-11 MED ORDER — CEFDINIR 300 MG PO CAPS
300.0000 mg | ORAL_CAPSULE | Freq: Two times a day (BID) | ORAL | 0 refills | Status: AC
  Filled 2022-09-11: qty 3, 2d supply, fill #0

## 2022-09-11 MED ORDER — ALBUTEROL SULFATE (2.5 MG/3ML) 0.083% IN NEBU
2.5000 mg | INHALATION_SOLUTION | RESPIRATORY_TRACT | 0 refills | Status: DC | PRN
  Filled 2022-09-11: qty 90, 5d supply, fill #0

## 2022-09-11 MED ORDER — PREDNISONE 20 MG PO TABS
40.0000 mg | ORAL_TABLET | Freq: Every day | ORAL | 0 refills | Status: AC
  Filled 2022-09-11: qty 4, 2d supply, fill #0

## 2022-09-11 NOTE — TOC Transition Note (Signed)
Transition of Care [facility]) - CM/SW Discharge Note   Patient Details  Name: Evan Brock MRN: [id number] Date of Birth: 10-17-1951  [facility]) CM/SW Contact:  Tom-Johnson, Hershal Coria, RN Phone Number: 09/11/2022, 12:48 PM   Clinical Narrative:     Patient is scheduled for discharge today. No TOC needs or recommendations noted. Patient denies any needs. Family to transport at discharge. No further TOC needs noted.   Final next level of care: Home/Self Care Barriers to Discharge: Barriers Resolved   Patient Goals and CMS Choice Patient states their goals for this hospitalization and ongoing recovery are:: To return home CMS Medicare.gov Compare Post Acute Care list provided to:: Patient Choice offered to / list presented to : NA  Discharge Placement                Patient to be transferred to facility by: Family      Discharge Plan and Services                DME Arranged: N/A DME Agency: NA       HH Arranged: NA HH Agency: NA        Social Determinants of Health (SDOH) Interventions Housing Interventions: Intervention Not Indicated   Readmission Risk Interventions     No data to display

## 2022-09-11 NOTE — Progress Notes (Signed)
DISCHARGE NOTE HOME [place] to be discharged Home per MD order. Discussed prescriptions and follow up appointments with the patient. Prescriptions given to patient; medication list explained in detail. Patient verbalized understanding.  Skin clean, dry and intact without evidence of skin break down, no evidence of skin tears noted. IV catheter discontinued intact. Site without signs and symptoms of complications. Dressing and pressure applied. Pt denies pain at the site currently. No complaints noted.  Patient free of lines, drains, and wounds.   An After Visit Summary (AVS) was printed and given to the patient. Patient escorted via wheelchair, and discharged home via private auto.  Margarita Grizzle, RN

## 2022-09-11 NOTE — Progress Notes (Signed)
Pt. Ambulated in hallway without assistance, O2 sat 95% on R/A, no distress ntoed  Evan Brock

## 2022-09-11 NOTE — Plan of Care (Signed)
  Problem: Education: Goal: Ability to describe self-care measures that may prevent or decrease complications (Diabetes Survival Skills Education) will improve Outcome: Completed/Met Goal: Individualized Educational Video(s) Outcome: Completed/Met   Problem: Coping: Goal: Ability to adjust to condition or change in health will improve Outcome: Completed/Met   Problem: Fluid Volume: Goal: Ability to maintain a balanced intake and output will improve Outcome: Completed/Met   Problem: Health Behavior/Discharge Planning: Goal: Ability to identify and utilize available resources and services will improve Outcome: Completed/Met Goal: Ability to manage health-related needs will improve Outcome: Completed/Met   Problem: Metabolic: Goal: Ability to maintain appropriate glucose levels will improve Outcome: Completed/Met   Problem: Nutritional: Goal: Maintenance of adequate nutrition will improve Outcome: Completed/Met Goal: Progress toward achieving an optimal weight will improve Outcome: Completed/Met   Problem: Skin Integrity: Goal: Risk for impaired skin integrity will decrease Outcome: Completed/Met   Problem: Tissue Perfusion: Goal: Adequacy of tissue perfusion will improve Outcome: Completed/Met   

## 2022-09-13 LAB — CULTURE, BLOOD (ROUTINE X 2)
Culture: NO GROWTH
Culture: NO GROWTH
Special Requests: ADEQUATE
Special Requests: ADEQUATE

## 2022-09-29 ENCOUNTER — Encounter: Payer: Self-pay | Admitting: Cardiology

## 2022-09-29 ENCOUNTER — Ambulatory Visit: Payer: Medicare HMO | Admitting: Cardiology

## 2022-09-29 ENCOUNTER — Other Ambulatory Visit: Payer: Self-pay | Admitting: Cardiology

## 2022-09-29 VITALS — BP 138/90 | HR 78 | Ht 69.0 in | Wt 200.2 lb

## 2022-09-29 DIAGNOSIS — I10 Essential (primary) hypertension: Secondary | ICD-10-CM

## 2022-09-29 DIAGNOSIS — Z955 Presence of coronary angioplasty implant and graft: Secondary | ICD-10-CM

## 2022-09-29 DIAGNOSIS — I2510 Atherosclerotic heart disease of native coronary artery without angina pectoris: Secondary | ICD-10-CM

## 2022-09-29 DIAGNOSIS — E119 Type 2 diabetes mellitus without complications: Secondary | ICD-10-CM

## 2022-09-29 DIAGNOSIS — Z87891 Personal history of nicotine dependence: Secondary | ICD-10-CM

## 2022-09-29 DIAGNOSIS — E782 Mixed hyperlipidemia: Secondary | ICD-10-CM

## 2022-09-29 DIAGNOSIS — I77810 Thoracic aortic ectasia: Secondary | ICD-10-CM

## 2022-09-29 DIAGNOSIS — I214 Non-ST elevation (NSTEMI) myocardial infarction: Secondary | ICD-10-CM

## 2022-09-29 DIAGNOSIS — J449 Chronic obstructive pulmonary disease, unspecified: Secondary | ICD-10-CM

## 2022-09-29 MED ORDER — LOSARTAN POTASSIUM 25 MG PO TABS: 25.0000 mg | ORAL_TABLET | Freq: Every evening | ORAL | 0 refills | Status: DC

## 2022-09-29 MED ORDER — NITROGLYCERIN 0.4 MG SL SUBL: 0.4000 mg | SUBLINGUAL_TABLET | SUBLINGUAL | 1 refills | Status: DC | PRN

## 2022-09-29 MED ORDER — EMPAGLIFLOZIN 10 MG PO TABS: 10.0000 mg | ORAL_TABLET | Freq: Every day | ORAL | 0 refills | Status: AC

## 2022-09-29 NOTE — Progress Notes (Signed)
ID:  Evan Brock, DOB Aug 27, 1951, MRN [id number]  PCP:  Sherald Hess., MD  Cardiologist:  Rex Kras, DO, [facility] (established care February 16, 2022)  Date: 09/29/22 Last Office Visit: 06/02/2022  Chief Complaint  Patient presents with   Follow-up   Atherosclerosis of native coronary artery of native heart w   Hypertension    HPI  Evan Brock is a 71 y.o. 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  Originally seen in the hospital for chest pain evaluation.  Patient underwent angiography and was noted to have obstructive CAD.  Culprit vessel was noted to be proximal circumflex and he underwent aspiration thrombectomy status post PTCA and stenting.  He presents today for follow-up.  Since last office visit patient was hospitalized at [facility] in November 2023 for acute hypoxic respiratory failure/pneumonia.  During the hospitalization he also had developed acute kidney injury and therefore he was requested to stop chlorthalidone, Jardiance, losartan, spironolactone.  His blood pressures recently have been elevated both at home and also in the office.  He denies anginal discomfort or heart failure symptoms.  He is tolerating dual antiplatelet therapy well without any endorsement of bleeding.  He recently had post hospital follow-up with PCP and had labs.  Will request records.  No use of sublingual nitroglycerin tablets since last office visit.  Denies anginal discomfort or heart failure symptoms.  ALLERGIES: No Known Allergies  MEDICATION LIST PRIOR TO VISIT: Current Meds  Medication Sig   albuterol (PROVENTIL) (2.5 MG/3ML) 0.083% nebulizer solution Inhale 3  mLs (2.5 mg total) by nebulization every 4 (four) hours as needed for wheezing or shortness of breath.   albuterol (VENTOLIN HFA) 108 (90 Base) MCG/ACT inhaler INHALE 1 PUFF FOUR TIMES DAILY, AS NEEDED (Patient taking differently: 2 puffs every 4 (four) hours as needed for wheezing or shortness of breath.)   aspirin 81 MG EC tablet Take 1 tablet (81 mg total) by mouth daily. Swallow whole.   Blood Pressure Monitor KIT 1 kit by Does not apply route 3 (three) times daily as needed.   empagliflozin (JARDIANCE) 10 MG TABS tablet Take 1 tablet (10 mg total) by mouth daily before breakfast.   gabapentin (NEURONTIN) 800 MG tablet Take 800 mg by mouth 3 (three) times daily.   losartan (COZAAR) 25 MG tablet Take 1 tablet (25 mg total) by mouth every evening.   metFORMIN (GLUCOPHAGE) 500 MG tablet Take 1 tablet (500 mg total) by mouth 2 (two) times daily with a meal. Resume on Sunday 02/19/2022   metoprolol succinate (TOPROL-XL) 50 MG 24 hr tablet TAKE 1 TABLET(50 MG) BY MOUTH EVERY MORNING WITH OR IMMEDIATELY FOLLOWING A MEAL (Patient taking differently: Take 50 mg by mouth daily. WITH OR IMMEDIATELY FOLLOWING A MEAL)   naloxone (NARCAN) nasal spray 4 mg/0.1 mL CALL 911. SPR CONTENTS OF ONE SPRAYER (0.1ML) INTO ONE NOSTRIL. REPEAT IN 2-3 MIN IF SYMPTOMS OF OPIOID EMERGENCY PERSIST, ALTERNATE NOSTRILS   rosuvastatin (CRESTOR) 40 MG tablet TAKE 1 TABLET BY MOUTH AT BEDTIME (Patient taking differently: Take 40 mg by mouth at bedtime.)   tamsulosin (FLOMAX) 0.4 MG CAPS capsule Take 0.8 mg by mouth daily after supper.   ticagrelor (BRILINTA) 90 MG TABS tablet Take 1 tablet (90  mg total) by mouth 2 (two) times daily.   TRELEGY ELLIPTA 100-62.5-25 MCG/ACT AEPB Take 1 puff by mouth daily.   vitamin B-12 (CYANOCOBALAMIN) 50 MCG tablet Take 50 mcg by mouth daily.   [DISCONTINUED] nitroGLYCERIN (NITROSTAT) 0.4 MG SL tablet Place 1 tablet (0.4 mg total) under the tongue every 5 (five) minutes as needed for chest pain.      PAST MEDICAL HISTORY: Past Medical History:  Diagnosis Date   Arthritis    "hips" (10/01/2013)   Asthma    COPD (chronic obstructive pulmonary disease) (HCC)    Coronary artery disease    Exertional shortness of breath    Hyperlipidemia    Hypertension     PAST SURGICAL HISTORY: Past Surgical History:  Procedure Laterality Date   CORONARY STENT INTERVENTION N/A 02/16/2022   Procedure: CORONARY STENT INTERVENTION;  Surgeon: Nigel Mormon, MD;  Location: [place] [facility];  Service: Cardiovascular;  Laterality: N/A;   CORONARY THROMBECTOMY N/A 02/16/2022   Procedure: Coronary Thrombectomy;  Surgeon: Nigel Mormon, MD;  Location: [place] [facility];  Service: Cardiovascular;  Laterality: N/A;   JOINT REPLACEMENT     LEFT HEART CATH AND CORONARY ANGIOGRAPHY N/A 02/16/2022   Procedure: LEFT HEART CATH AND CORONARY ANGIOGRAPHY;  Surgeon: Nigel Mormon, MD;  Location: [place] [facility];  Service: Cardiovascular;  Laterality: N/A;   TOTAL HIP ARTHROPLASTY Right 2010   TOTAL HIP ARTHROPLASTY Left 08/19/2013   Procedure: TOTAL HIP ARTHROPLASTY ANTERIOR APPROACH;  Surgeon: Hessie Dibble, MD;  Location: [place];  Service: Orthopedics;  Laterality: Left;    FAMILY HISTORY: The patient family history includes Diabetes in his mother; Hypertension in his father; Lupus in his sister.  SOCIAL HISTORY:  The patient  reports that he quit smoking about 31 years ago. His smoking use included cigarettes. He has never used smokeless tobacco. He reports current alcohol use of about 3.0 standard drinks of alcohol per week. He reports that he does not currently use drugs after having used the following drugs: Marijuana.  REVIEW OF SYSTEMS: Review of Systems  Cardiovascular:  Negative for chest pain, claudication, dyspnea on exertion, leg swelling, near-syncope, orthopnea, palpitations, paroxysmal nocturnal dyspnea and syncope.  Respiratory:  Negative for shortness of breath.    Hematologic/Lymphatic: Negative for bleeding problem.    PHYSICAL EXAM:    09/29/2022   11:51 AM 09/11/2022   10:38 AM 09/11/2022    4:47 AM  Vitals with BMI  Height _0     Weight 200 lbs 3 oz  189 lbs 10 oz  BMI 89.38  10.17  Systolic 510 258 527  Diastolic 90 81 82  Pulse 78 77 65    CONSTITUTIONAL: Well-developed and well-nourished. No acute distress.  SKIN: Skin is warm and dry. No rash noted. No cyanosis. No pallor. No jaundice HEAD: Normocephalic and atraumatic.  EYES: No scleral icterus MOUTH/THROAT: Moist oral membranes.  NECK: No JVD present. No thyromegaly noted. No carotid bruits  CHEST Normal respiratory effort. No intercostal retractions  LUNGS: Clear to auscultation bilaterally.  No stridor. No wheezes. No rales.  CARDIOVASCULAR: Regular rate and rhythm, positive S1-S2, no murmurs rubs or gallops appreciated. ABDOMINAL: Obese, soft, nontender, nondistended, positive bowel sounds in all 4 quadrants, no apparent ascites.  EXTREMITIES: No peripheral edema, warm to touch, 2+ bilateral DP and PT pulses HEMATOLOGIC: No significant bruising NEUROLOGIC: Oriented to person, place, and time. Nonfocal. Normal muscle tone.  PSYCHIATRIC: Normal mood and affect. Normal behavior. Cooperative  CARDIAC  DATABASE: EKG: 03/03/2022: Normal sinus rhythm, 80 bpm, without underlying injury pattern 09/29/2022: Sinus rhythm, 84 bpm, PACs, without underlying injury pattern.  Echocardiogram: 02/17/2022: LVEF 60 to 65%, mild LVH, normal diastolic function, moderate pulmonary hypertension, moderate MR eccentric jet to his anterior mitral valve leaflet, trileaflet aortic valve without stenosis or regurgitation, mild dilatation of the ascending aorta 41 mm, RAP 8 mmHg   Heart Catheterization: 02/16/2022 LM: Normal LAD: No significant disease RCA: Prox and distal 30% disease Lcx: Large vessel with prox occlusion with organized heavy thrombus (Culprit)   Successful percutaneous coronary  intervention prox Lcx     Aspiration thrombectomy     PTCA and stent placement 4.0 X 16 mm Synergy drug-eluting stent     Post dilatation with 4.0X8 mm [place] balloon up to 22 atm   100%--->0% residual stenosis TIMI flow 0-->II   Distal embolization due to late presentation.  Recommend aggrastat and nitro drip overnight.   Difficult procedure due to difficulty wiring tortuous Lcx vessel with organized thrombus requiring multiple wires and use of angled support catheter  LABORATORY DATA:    Latest Ref Rng & Units 09/11/2022    3:29 AM 09/10/2022    3:33 AM 09/09/2022    5:15 AM  CBC  WBC 4.0 - 10.5 K/uL 11.6  11.9  9.8   Hemoglobin 13.0 - 17.0 g/dL 12.3  12.3  11.5   Hematocrit 39.0 - 52.0 % 38.9  38.8  35.9   Platelets 150 - 400 K/uL 321  290  266        Latest Ref Rng & Units 09/11/2022    3:29 AM 09/10/2022    3:33 AM 09/09/2022    5:15 AM  CMP  Glucose 70 - 99 mg/dL 115  133  122   BUN 8 - 23 mg/dL _0 Creatinine 0.61 - 1.24 mg/dL 1.42  1.43  1.49   Sodium 135 - 145 mmol/L 137  137  138   Potassium 3.5 - 5.1 mmol/L 3.9  4.1  4.2   Chloride 98 - 111 mmol/L 99  100  99   CO2 22 - 32 mmol/L _1 Calcium 8.9 - 10.3 mg/dL 9.9  9.8  9.5   Total Protein 6.5 - 8.1 g/dL  6.8  6.6   Total Bilirubin 0.3 - 1.2 mg/dL  0.3  0.4   Alkaline Phos 38 - 126 U/L  49  47   AST 15 - 41 U/L  15  14   ALT 0 - 44 U/L  16  16     Lipid Panel  Lab Results  Component Value Date   CHOL 171 02/17/2022   HDL 41 02/17/2022   LDLCALC 111 (H) 02/17/2022   LDLDIRECT 102.3 (H) 02/17/2022   TRIG 95 02/17/2022   CHOLHDL 4.2 02/17/2022    No components found for: "NTPROBNP" No results for input(s): "PROBNP" in the last 8760 hours. Recent Labs    02/17/22 0040  TSH 0.522    BMP Recent Labs    09/09/22 0515 09/10/22 0333 09/11/22 0329  NA 138 137 137  K 4.2 4.1 3.9  CL 99 100 99  CO2 _2 GLUCOSE 122* 133* 115*  BUN 20 22 26*  CREATININE 1.49* 1.43* 1.42*   CALCIUM 9.5 9.8 9.9  GFRNONAA 50* 52* 53*    HEMOGLOBIN A1C Lab Results  Component Value Date   HGBA1C 6.9 (H) 09/08/2022  MPG 151.33 09/08/2022    IMPRESSION:    ICD-10-CM   1. Atherosclerosis of native coronary artery of native heart without angina pectoris  I25.10 EKG 12-Lead    empagliflozin (JARDIANCE) 10 MG TABS tablet    losartan (COZAAR) 25 MG tablet    nitroGLYCERIN (NITROSTAT) 0.4 MG SL tablet    2. Non-ST elevation (NSTEMI) myocardial infarction (HCC)  I21.4 nitroGLYCERIN (NITROSTAT) 0.4 MG SL tablet    3. Status post coronary artery stent placement  Z95.5 nitroGLYCERIN (NITROSTAT) 0.4 MG SL tablet    4. Essential hypertension, benign  I10 empagliflozin (JARDIANCE) 10 MG TABS tablet    losartan (COZAAR) 25 MG tablet    Basic metabolic panel    5. Ascending aorta dilatation (HCC)  I77.810     6. Non-insulin dependent type 2 diabetes mellitus (HCC)  E11.9 empagliflozin (JARDIANCE) 10 MG TABS tablet    losartan (COZAAR) 25 MG tablet    7. Mixed hyperlipidemia  E78.2     8. Former smoker  Z87.891     2. Chronic obstructive pulmonary disease, unspecified COPD type ([place])  J44.9        RECOMMENDATIONS: Evan Brock is a 71 y.o. 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  Atherosclerosis of native coronary artery of native heart without angina pectoris / s/p PCI / Hx NSTEMI: Index event February 16, 2022. Underwent aspiration thrombectomy followed by PTCA and PCI to the culprit LCx. Recommended dual antiplatelet therapy for 1 year-until February 17, 2023. Tolerating aspirin and Brilinta well without any side effects or intolerances. Will restart Jardiance  10 mg p.o. every morning. Will restart losartan at a lower dose of 25 mg p.o. every afternoon. Will obtain baseline labs are performed by his PCP for comparison and repeat labs in 1 week after being on the above-mentioned medications. No use of sublingual nitroglycerin tablets since last office visit. EKG is nonischemic. Continue current medical therapy with a focus of up titration of GDMT as hemodynamics and laboratory values allow. Reemphasized importance of secondary prevention.  Essential hypertension, benign Office blood pressures are acceptable but not at goal.  Recommend a goal blood pressure of 120-130 mmHg if able to tolerate given his dilated ascending aorta. Medication changes as noted above.   Ascending aorta dilatation (HCC) 41 mm ascending aorta based on echocardiogram 02/18/2022. Will repeat CT of the chest without contrast in April 2024 to reevaluate.  Non-insulin dependent type 2 diabetes mellitus ([place]) Educated him on the importance of glycemic control. Continue metformin, Jardiance, statin therapy  Mixed hyperlipidemia Tolerating statin therapy well without any side effects or intolerance. Would recommend a goal LDL less than 55 mg/dL. Will monitor peripherally.  Patient is interested in being enrolled into ambulatory blood pressure monitoring and assistance with medication coverage.   FINAL MEDICATION LIST END OF ENCOUNTER: Meds ordered this encounter  Medications   empagliflozin (JARDIANCE) 10 MG TABS tablet    Sig: Take 1 tablet (10 mg total) by mouth daily before breakfast.    Dispense:  30 tablet    Refill:  0   losartan (COZAAR) 25 MG tablet    Sig: Take 1 tablet (25 mg total) by mouth every evening.    Dispense:  30 tablet    Refill:  0   nitroGLYCERIN (NITROSTAT) 0.4 MG SL tablet    Sig: Place 1 tablet (0.4 mg total) under the tongue every 5 (five) minutes as needed for chest pain.    Dispense:  25 tablet    Refill:  1    Medications Discontinued  During This Encounter  Medication Reason   Budeson-Glycopyrrol-Formoterol (BREZTRI AEROSPHERE) 160-9-4.8 MCG/ACT AERO Discontinued by provider   nitroGLYCERIN (NITROSTAT) 0.4 MG SL tablet Reorder     Current Outpatient Medications:    albuterol (PROVENTIL) (2.5 MG/3ML) 0.083% nebulizer solution, Inhale 3 mLs (2.5 mg total) by nebulization every 4 (four) hours as needed for wheezing or shortness of breath., Disp: 90 mL, Rfl: 0   albuterol (VENTOLIN HFA) 108 (90 Base) MCG/ACT inhaler, INHALE 1 PUFF FOUR TIMES DAILY, AS NEEDED (Patient taking differently: 2 puffs every 4 (four) hours as needed for wheezing or shortness of breath.), Disp: 8 g, Rfl: 0   aspirin 81 MG EC tablet, Take 1 tablet (81 mg total) by mouth daily. Swallow whole., Disp: 30 tablet, Rfl: 1   Blood Pressure Monitor KIT, 1 kit by Does not apply route 3 (three) times daily as needed., Disp: 1 kit, Rfl: 0   empagliflozin (JARDIANCE) 10 MG TABS tablet, Take 1 tablet (10 mg total) by mouth daily before breakfast., Disp: 30 tablet, Rfl: 0   gabapentin (NEURONTIN) 800 MG tablet, Take 800 mg by mouth 3 (three) times daily., Disp: , Rfl:    losartan (COZAAR) 25 MG tablet, Take 1 tablet (25 mg total) by mouth every evening., Disp: 30 tablet, Rfl: 0   metFORMIN (GLUCOPHAGE) 500 MG tablet, Take 1 tablet (500 mg total) by mouth 2 (two) times daily with a meal. Resume on Sunday 02/19/2022, Disp: 180 tablet, Rfl: 1   metoprolol succinate (TOPROL-XL) 50 MG 24 hr tablet, TAKE 1 TABLET(50 MG) BY MOUTH EVERY MORNING WITH OR IMMEDIATELY FOLLOWING A MEAL (Patient taking differently: Take 50 mg by mouth daily. WITH OR IMMEDIATELY FOLLOWING A MEAL), Disp: 90 tablet, Rfl: 1   naloxone (NARCAN) nasal spray 4 mg/0.1 mL, CALL 911. SPR CONTENTS OF ONE SPRAYER (0.1ML) INTO ONE NOSTRIL. REPEAT IN 2-3 MIN IF SYMPTOMS OF OPIOID EMERGENCY PERSIST, ALTERNATE NOSTRILS, Disp: , Rfl:    rosuvastatin (CRESTOR) 40 MG tablet, TAKE 1 TABLET BY MOUTH AT BEDTIME (Patient taking  differently: Take 40 mg by mouth at bedtime.), Disp: 90 tablet, Rfl: 0   tamsulosin (FLOMAX) 0.4 MG CAPS capsule, Take 0.8 mg by mouth daily after supper., Disp: , Rfl:    ticagrelor (BRILINTA) 90 MG TABS tablet, Take 1 tablet (90 mg total) by mouth 2 (two) times daily., Disp: 180 tablet, Rfl: 3   TRELEGY ELLIPTA 100-62.5-25 MCG/ACT AEPB, Take 1 puff by mouth daily., Disp: , Rfl:    vitamin B-12 (CYANOCOBALAMIN) 50 MCG tablet, Take 50 mcg by mouth daily., Disp: , Rfl:    nitroGLYCERIN (NITROSTAT) 0.4 MG SL tablet, Place 1 tablet (0.4 mg total) under the tongue every 5 (five) minutes as needed for chest pain., Disp: 25 tablet, Rfl: 1  Orders Placed This Encounter  Procedures   Basic metabolic panel   EKG [id number]    There are no Patient Instructions on file for this visit.   --Continue cardiac medications as reconciled in final medication list. --Return in about 6 months (around 03/31/2023) for Follow up CAD and Ao dilatation s/p imaging. . Or sooner if needed. --Continue follow-up with your primary care physician regarding the management of your other chronic comorbid conditions.  Patient's questions and concerns were addressed to his satisfaction. He voices understanding of the instructions provided during this encounter.   This note was created using a voice recognition software as a result there may be grammatical errors inadvertently enclosed that do not reflect the nature of this encounter. Every attempt is made to correct such errors.  Rex Kras, [place], [facility]  Pager: [phone] Office: [phone]

## 2022-10-18 ENCOUNTER — Telehealth: Payer: Self-pay

## 2022-10-18 NOTE — Telephone Encounter (Signed)
Patient no showed to pick up BP monitoring device today (10:30am)

## 2022-10-19 ENCOUNTER — Encounter (HOSPITAL_COMMUNITY): Payer: Self-pay

## 2022-10-19 ENCOUNTER — Ambulatory Visit (HOSPITAL_COMMUNITY)
Admission: EM | Admit: 2022-10-19 | Discharge: 2022-10-19 | Disposition: A | Discharge status: Home / Self Care | Payer: Medicare HMO | Attending: Internal Medicine | Admitting: Internal Medicine

## 2022-10-19 ENCOUNTER — Ambulatory Visit (INDEPENDENT_AMBULATORY_CARE_PROVIDER_SITE_OTHER): Payer: Medicare HMO

## 2022-10-19 DIAGNOSIS — R0602 Shortness of breath: Secondary | ICD-10-CM

## 2022-10-19 DIAGNOSIS — J441 Chronic obstructive pulmonary disease with (acute) exacerbation: Secondary | ICD-10-CM | POA: Diagnosis not present

## 2022-10-19 MED ORDER — METHYLPREDNISOLONE SODIUM SUCC 125 MG IJ SOLR
60.0000 mg | Freq: Once | INTRAMUSCULAR | Status: AC
  Administered 2022-10-19: 60 mg via INTRAMUSCULAR

## 2022-10-19 MED ORDER — METHYLPREDNISOLONE SODIUM SUCC 125 MG IJ SOLR
INTRAMUSCULAR | Status: AC
  Filled 2022-10-19: qty 2

## 2022-10-19 MED ORDER — DOXYCYCLINE HYCLATE 100 MG PO CAPS: 100.0000 mg | ORAL_CAPSULE | Freq: Two times a day (BID) | ORAL | 0 refills | Status: AC

## 2022-10-19 MED ORDER — PREDNISONE 20 MG PO TABS: 40.0000 mg | ORAL_TABLET | Freq: Every day | ORAL | 0 refills | Status: AC

## 2022-10-19 NOTE — ED Provider Notes (Signed)
[place]    CSN: [id number] Arrival date & time: 10/19/22  1505      History   Chief Complaint Chief Complaint  Patient presents with   Shortness of Breath    HPI Evan Brock is a 70 y.o. male with a history of COPD on Trelegy and albuterol inhalers comes to urgent care with a 1 day history of shortness of breath with exertion.  Patient says symptoms started yesterday and has worsened this morning.  Shortness of breath is associated with wheezing.  He denies any cough or sputum production.  He denies any chest pain or chest pressure.  No fever, chills or rigors.  No dizziness, near syncope or syncopal episodes.  No sick contacts.  Patient denies any sore throat or generalized body aches.  HPI  Past Medical History:  Diagnosis Date   Arthritis    "hips" (10/01/2013)   Asthma    COPD (chronic obstructive pulmonary disease) (HCC)    Coronary artery disease    Exertional shortness of breath    Hyperlipidemia    Hypertension     Patient Active Problem List   Diagnosis Date Noted   Acute hypoxic respiratory failure ([place]) 09/08/2022   Status post coronary artery stent placement    S/P PTCA (percutaneous transluminal coronary angioplasty)    Non-insulin dependent type 2 diabetes mellitus ([place])    Mixed hyperlipidemia    Coronary atherosclerosis due to calcified coronary lesion    Former smoker    Class 1 obesity due to excess calories with serious comorbidity and body mass index (BMI) of 31.0 to 31.9 in adult    Non-ST elevation (NSTEMI) myocardial infarction ([place]) 02/16/2022   NSTEMI (non-ST elevated myocardial infarction) ([place]) 02/16/2022   Acute respiratory failure with hypoxia ([place]) 11/12/2021   CAP (community acquired pneumonia) 02/16/2021   Hypoxia 02/16/2021   COPD with acute exacerbation ([place]) 02/16/2021   Diabetes mellitus type 2 in obese ([place]) 02/16/2021   Acute kidney injury ([place]) 02/16/2021   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    Leukocytosis 10/04/2015   COPD (chronic obstructive pulmonary disease) ([place]) 10/01/2015   Essential hypertension, benign 03/01/2015   Asthma with acute exacerbation 01/26/2015   Thrombocytopenia, unspecified ([place]) 11/06/2013   Acute respiratory failure ([place]) with hypoxia 10/01/2013   Acute renal failure ([place]) 10/01/2013    Past Surgical History:  Procedure Laterality Date   CORONARY STENT INTERVENTION N/A 02/16/2022   Procedure: CORONARY STENT INTERVENTION;  Surgeon: Nigel Mormon, MD;  Location: [facility];  Service: Cardiovascular;  Laterality: N/A;   CORONARY THROMBECTOMY N/A 02/16/2022   Procedure: Coronary Thrombectomy;  Surgeon: Nigel Mormon, MD;  Location: [facility];  Service: Cardiovascular;  Laterality: N/A;   JOINT REPLACEMENT     LEFT HEART CATH AND CORONARY ANGIOGRAPHY N/A 02/16/2022   Procedure: LEFT HEART CATH AND CORONARY ANGIOGRAPHY;  Surgeon: Nigel Mormon, MD;  Location: [facility];  Service: Cardiovascular;  Laterality: N/A;   TOTAL HIP ARTHROPLASTY Right 2010   TOTAL HIP ARTHROPLASTY Left 08/19/2013   Procedure: TOTAL HIP ARTHROPLASTY ANTERIOR APPROACH;  Surgeon: Hessie Dibble, MD;  Location: [facility];  Service: Orthopedics;  Laterality: Left;       Home Medications    Prior to Admission medications   Medication Sig Start Date End Date Taking? Authorizing Provider  doxycycline (VIBRAMYCIN) 100 MG capsule Take 1 capsule (100 mg total) by mouth 2 (two) times daily for 7 days. 10/19/22 10/26/22 Yes Grete Bosko,  Myrene Galas, MD  predniSONE (DELTASONE) 20 MG tablet Take 2 tablets (40 mg total) by mouth daily for 5 days. 10/19/22 10/24/22 Yes Frenchie Pribyl, Myrene Galas, MD  albuterol (PROVENTIL) (2.5 MG/3ML) 0.083% nebulizer solution Inhale 3 mLs (2.5 mg total) by nebulization every 4 (four) hours as needed for wheezing or shortness of breath. 09/11/22   Alen Bleacher, MD  albuterol (VENTOLIN HFA) 108 (90 Base) MCG/ACT inhaler INHALE 1 PUFF FOUR  TIMES DAILY, AS NEEDED Patient taking differently: 2 puffs every 4 (four) hours as needed for wheezing or shortness of breath. 10/26/21 10/26/22  Raspet, Derry Skill, PA-C  aspirin 81 MG EC tablet Take 1 tablet (81 mg total) by mouth daily. Swallow whole. 02/17/22   Patwardhan, Reynold Bowen, MD  Blood Pressure Monitor KIT 1 kit by Does not apply route 3 (three) times daily as needed. 11/05/19   Kerin Perna, NP  empagliflozin (JARDIANCE) 10 MG TABS tablet Take 1 tablet (10 mg total) by mouth daily before breakfast. 09/29/22 10/29/22  Tolia, Sunit, DO  gabapentin (NEURONTIN) 800 MG tablet Take 800 mg by mouth 3 (three) times daily. 02/01/22   [provider]  losartan (COZAAR) 25 MG tablet TAKE 1 TABLET(25 MG) BY MOUTH EVERY EVENING 09/29/22   Tolia, Sunit, DO  metFORMIN (GLUCOPHAGE) 500 MG tablet Take 1 tablet (500 mg total) by mouth 2 (two) times daily with a meal. Resume on Sunday 02/19/2022 02/17/22   Patwardhan, Reynold Bowen, MD  metoprolol succinate (TOPROL-XL) 50 MG 24 hr tablet TAKE 1 TABLET(50 MG) BY MOUTH EVERY MORNING WITH OR IMMEDIATELY FOLLOWING A MEAL Patient taking differently: Take 50 mg by mouth daily. WITH OR IMMEDIATELY FOLLOWING A MEAL 07/05/22   Tolia, Sunit, DO  naloxone (NARCAN) nasal spray 4 mg/0.1 mL CALL 911. SPR CONTENTS OF ONE SPRAYER (0.1ML) INTO ONE NOSTRIL. REPEAT IN 2-3 MIN IF SYMPTOMS OF OPIOID EMERGENCY PERSIST, ALTERNATE NOSTRILS    [provider]  nitroGLYCERIN (NITROSTAT) 0.4 MG SL tablet Place 1 tablet (0.4 mg total) under the tongue every 5 (five) minutes as needed for chest pain. 09/29/22 12/28/22  Tolia, Sunit, DO  rosuvastatin (CRESTOR) 40 MG tablet TAKE 1 TABLET BY MOUTH AT BEDTIME Patient taking differently: Take 40 mg by mouth at bedtime. 07/07/22   Tolia, Sunit, DO  tamsulosin (FLOMAX) 0.4 MG CAPS capsule Take 0.8 mg by mouth daily after supper. 11/09/21   [provider]  ticagrelor (BRILINTA) 90 MG TABS tablet Take 1 tablet (90 mg total) by mouth 2  (two) times daily. 08/30/22 08/30/23  Tolia, Sunit, DO  TRELEGY ELLIPTA 100-62.5-25 MCG/ACT AEPB Take 1 puff by mouth daily. 12/21/21   [provider]  vitamin B-12 (CYANOCOBALAMIN) 50 MCG tablet Take 50 mcg by mouth daily.    [provider]    Family History Family History  Problem Relation Age of Onset   Diabetes Mother    Hypertension Father    Lupus Sister     Social History Social History   Tobacco Use   Smoking status: Former    Packs/day: 0.00    Years: 0.00    Total pack years: 0.00    Types: Cigarettes    Quit date: 10/23/1990    Years since quitting: 32.0   Smokeless tobacco: Never   Tobacco comments:    10/01/2013 "quit smoking cigarettes~ 15-20 yr ago"  Vaping Use   Vaping Use: Never used  Substance Use Topics   Alcohol use: Yes    Alcohol/week: 3.0 standard drinks of alcohol  Types: 3 Cans of beer per week    Comment: 6pack in 2 days   Drug use: Not Currently    Types: Marijuana    Comment: 10/01/2013 "last drug use was 10 yr ago"     Allergies   Patient has no known allergies.   Review of Systems Review of Systems As per HPI  Physical Exam Triage Vital Signs ED Triage Vitals [10/19/22 0919]  Enc Vitals Group     BP (!) 147/98     Pulse Rate 72     Resp 18     Temp 98.4 F (36.9 C)     Temp Source Oral     SpO2 100 %     Weight      Height      Head Circumference      Peak Flow      Pain Score      Pain Loc      Pain Edu?      Excl. in [place]?    No data found.  Updated Vital Signs BP (!) 147/98 (BP Location: Left Arm)   Pulse 72   Temp 98.4 F (36.9 C) (Oral)   Resp 18   SpO2 100%   Visual Acuity Right Eye Distance:   Left Eye Distance:   Bilateral Distance:    Right Eye Near:   Left Eye Near:    Bilateral Near:     Physical Exam Vitals and nursing note reviewed.  Constitutional:      Appearance: He is ill-appearing.  Cardiovascular:     Rate and Rhythm: Normal rate and regular rhythm.  Pulmonary:      Effort: Pulmonary effort is normal. No tachypnea or respiratory distress.     Breath sounds: Examination of the right-middle field reveals wheezing. Examination of the left-middle field reveals wheezing. Examination of the right-lower field reveals decreased breath sounds and wheezing. Examination of the left-lower field reveals decreased breath sounds and wheezing. Decreased breath sounds and wheezing present. No rhonchi or rales.  Abdominal:     General: Bowel sounds are normal.     Palpations: Abdomen is soft.  Neurological:     Mental Status: He is alert.      UC Treatments / Results  Labs (all labs ordered are listed, but only abnormal results are displayed) Labs Reviewed - No data to display  EKG   Radiology DG Chest 2 View  Result Date: 10/19/2022 CLINICAL DATA:  71 year old male with shortness of breath.  COPD. EXAM: CHEST - 2 VIEW COMPARISON:  Chest radiographs 09/08/2022 and earlier. FINDINGS: PA and lateral views at 1153 hours. Stable mild tortuosity of the thoracic aorta. Cardiac size and other mediastinal contours remain within normal limits. Visualized tracheal air column is within normal limits. Stable lung volumes since last year, somewhat hyperinflated. No pneumothorax, pulmonary edema, pleural effusion, or confluent pulmonary opacity. No acute osseous abnormality identified. Visible bowel gas pattern is within normal limits. IMPRESSION: No acute cardiopulmonary abnormality. Electronically Signed   By: Genevie Ann M.D.   On: 10/19/2022 11:58    Procedures Procedures (including critical care time)  Medications Ordered in UC Medications - No data to display  Initial Impression / Assessment and Plan / UC Course  I have reviewed the triage vital signs and the nursing notes.  Pertinent labs & imaging results that were available during my care of the patient were reviewed by me and considered in my medical decision making (see chart for details).  1.  COPD with  acute exacerbation: Methylprednisolone 60 mg IM x 1 dose Prednisone 40 mg orally daily for 5 days Continue albuterol inhaler and trilogy inhaler Maintain adequate hydration Chest x-ray is negative for lung infiltration Return precautions given. Final Clinical Impressions(s) / UC Diagnoses   Final diagnoses:  COPD with acute exacerbation [facility])     Discharge Instructions      Please take medications as prescribed Maintain adequate hydration Chest x-ray is negative for pneumonia If you have worsening symptoms please return to urgent care to be reevaluated.     ED Prescriptions     Medication Sig Dispense Auth. Provider   predniSONE (DELTASONE) 20 MG tablet Take 2 tablets (40 mg total) by mouth daily for 5 days. 10 tablet Reine Bristow, Myrene Galas, MD   doxycycline (VIBRAMYCIN) 100 MG capsule Take 1 capsule (100 mg total) by mouth 2 (two) times daily for 7 days. 14 capsule Anam Bobby, Myrene Galas, MD      PDMP not reviewed this encounter.   Chase Picket, MD 10/19/22 1245

## 2022-10-19 NOTE — ED Triage Notes (Signed)
Pt presents with SOB x 2 days.Pt reports a h/o COPD. Pt has been using his inhaler with no relief.

## 2022-10-19 NOTE — Discharge Instructions (Signed)
Please take medications as prescribed Maintain adequate hydration Chest x-ray is negative for pneumonia If you have worsening symptoms please return to urgent care to be reevaluated.

## 2022-12-02 ENCOUNTER — Other Ambulatory Visit: Payer: Self-pay | Admitting: Cardiology

## 2022-12-05 NOTE — Telephone Encounter (Signed)
Should I refill?

## 2022-12-08 NOTE — Telephone Encounter (Signed)
As discussed at today's visit. Spironolactone was not on his medication list at last visit. He has had a history of acute kidney injury. Check a BMP to reevaluate his serum creatinine and if within normal limits we will refill spironolactone.  Robyne Matar Wheatley, DO, [facility]

## 2022-12-08 NOTE — Telephone Encounter (Signed)
Tried calling patient n/a no VM set up

## 2022-12-22 DIAGNOSIS — C61 Malignant neoplasm of prostate: Secondary | ICD-10-CM

## 2022-12-22 HISTORY — DX: Malignant neoplasm of prostate: C61

## 2023-01-08 ENCOUNTER — Other Ambulatory Visit: Payer: Self-pay | Admitting: Cardiology

## 2023-01-08 DIAGNOSIS — I214 Non-ST elevation (NSTEMI) myocardial infarction: Secondary | ICD-10-CM

## 2023-01-17 ENCOUNTER — Other Ambulatory Visit (HOSPITAL_COMMUNITY): Payer: Self-pay | Admitting: Urology

## 2023-01-17 DIAGNOSIS — C61 Malignant neoplasm of prostate: Secondary | ICD-10-CM

## 2023-02-15 ENCOUNTER — Other Ambulatory Visit (HOSPITAL_COMMUNITY): Payer: Medicare HMO

## 2023-02-16 ENCOUNTER — Encounter (HOSPITAL_COMMUNITY)
Admission: RE | Admit: 2023-02-16 | Discharge: 2023-02-16 | Disposition: A | Discharge status: Home / Self Care | Payer: Medicare HMO | Source: Ambulatory Visit | Attending: Urology | Admitting: Urology

## 2023-02-16 DIAGNOSIS — C61 Malignant neoplasm of prostate: Secondary | ICD-10-CM | POA: Diagnosis not present

## 2023-02-16 MED ORDER — PIFLIFOLASTAT F 18 (PYLARIFY) INJECTION
9.0000 | Freq: Once | INTRAVENOUS | Status: AC
  Administered 2023-02-16: 9.4 via INTRAVENOUS

## 2023-02-17 DIAGNOSIS — Z955 Presence of coronary angioplasty implant and graft: Secondary | ICD-10-CM

## 2023-02-17 HISTORY — DX: Presence of coronary angioplasty implant and graft: Z95.5

## 2023-02-23 ENCOUNTER — Other Ambulatory Visit: Payer: Self-pay | Admitting: Cardiology

## 2023-02-23 DIAGNOSIS — I2510 Atherosclerotic heart disease of native coronary artery without angina pectoris: Secondary | ICD-10-CM

## 2023-02-23 DIAGNOSIS — I214 Non-ST elevation (NSTEMI) myocardial infarction: Secondary | ICD-10-CM

## 2023-02-23 DIAGNOSIS — Z955 Presence of coronary angioplasty implant and graft: Secondary | ICD-10-CM

## 2023-03-05 NOTE — Progress Notes (Incomplete)
GU Location of Tumor / Histology: Prostate Ca  If Prostate Cancer, Gleason Score is (4 + 5) and PSA is (5.1 on 10/2022)  Biopsies      02/16/2023 Dr. Kasandra Knudsen NM PET (PSMA) Skull to Mid Thigh CLINICAL DATA:  Prostate cancer. PSA equal 5.0. High-grade prostate cancer  IMPRESSION: 1. Mild activity within the prostate gland is indeterminate. 2. Some limitation of evaluation the pelvis due to streak artifact from the bilateral hip prosthetics. 3. No metastatic adenopathy identified. 4. No visceral metastasis or skeletal metastasis.   Past/Anticipated interventions by urology, if any: NA  Past/Anticipated interventions by medical oncology, if any: NA  Weight changes, if any:  No  IPSS:  7 SHIM:  6  Bowel/Bladder complaints, if any:  No  Nausea/Vomiting, if any: No  Pain issues, if any:  0/10  SAFETY ISSUES: Prior radiation? No Pacemaker/ICD? No Possible current pregnancy? Male Is the patient on methotrexate? No  Current Complaints / other details:

## 2023-03-06 ENCOUNTER — Encounter: Payer: Self-pay | Admitting: Radiation Oncology

## 2023-03-06 ENCOUNTER — Other Ambulatory Visit: Payer: Self-pay

## 2023-03-06 ENCOUNTER — Ambulatory Visit
Admission: RE | Admit: 2023-03-06 | Discharge: 2023-03-06 | Disposition: A | Discharge status: Home / Self Care | Payer: Medicare HMO | Source: Ambulatory Visit | Attending: Radiation Oncology | Admitting: Radiation Oncology

## 2023-03-06 VITALS — BP 146/87 | HR 71 | Temp 96.8°F | Resp 18 | Ht 69.0 in | Wt 211.5 lb

## 2023-03-06 DIAGNOSIS — E785 Hyperlipidemia, unspecified: Secondary | ICD-10-CM | POA: Insufficient documentation

## 2023-03-06 DIAGNOSIS — Z7902 Long term (current) use of antithrombotics/antiplatelets: Secondary | ICD-10-CM | POA: Diagnosis not present

## 2023-03-06 DIAGNOSIS — I2510 Atherosclerotic heart disease of native coronary artery without angina pectoris: Secondary | ICD-10-CM | POA: Insufficient documentation

## 2023-03-06 DIAGNOSIS — N529 Male erectile dysfunction, unspecified: Secondary | ICD-10-CM | POA: Insufficient documentation

## 2023-03-06 DIAGNOSIS — Z7984 Long term (current) use of oral hypoglycemic drugs: Secondary | ICD-10-CM | POA: Diagnosis not present

## 2023-03-06 DIAGNOSIS — C61 Malignant neoplasm of prostate: Secondary | ICD-10-CM

## 2023-03-06 DIAGNOSIS — Z87891 Personal history of nicotine dependence: Secondary | ICD-10-CM | POA: Insufficient documentation

## 2023-03-06 DIAGNOSIS — Z7982 Long term (current) use of aspirin: Secondary | ICD-10-CM | POA: Insufficient documentation

## 2023-03-06 DIAGNOSIS — I10 Essential (primary) hypertension: Secondary | ICD-10-CM | POA: Insufficient documentation

## 2023-03-06 DIAGNOSIS — Z79899 Other long term (current) drug therapy: Secondary | ICD-10-CM | POA: Diagnosis not present

## 2023-03-06 DIAGNOSIS — J45909 Unspecified asthma, uncomplicated: Secondary | ICD-10-CM | POA: Insufficient documentation

## 2023-03-06 NOTE — Progress Notes (Signed)
Radiation Oncology         [phone]) [phone] ________________________________  Initial Outpatient Consultation  Name: Evan Brock MRN: [id number]  Date: 03/06/2023  DOB: 1951/10/15  WU:[id number], Vennie Homans, PA-C  [name], MD   REFERRING PHYSICIAN: [name], MD  DIAGNOSIS: 72 y.o. gentleman with Stage T1c adenocarcinoma of the prostate with Gleason score of 4+5, and PSA of 5.1.    ICD-10-CM   1. Malignant neoplasm of prostate (HCC)  C61       HISTORY OF PRESENT ILLNESS: Evan Brock is a 72 y.o. male with a diagnosis of prostate cancer. He was initially referred to [name] in 11/2021 for an elevated PSA, which was 5.31 on re-check at that time. He was scheduled for prostate biopsy, but he was hospitalized with acute MI in late 01/2022 and underwent cardiac stenting. Of note, he also declined prostate MRI in 05/2022 due to requiring endorectal coil for his bilateral hip replacements. His biopsy was postponed due to the need for anticoagulation post-stenting but fortunately, his PSA remained stable at 4.23 in 05/2022 and 5.1 in 10/2022. The patient proceeded to transrectal ultrasound with 12 biopsies of the prostate on 01/03/23.  The prostate volume measured 32.86 cc.  Out of 12 core biopsies, 10 were positive.  The maximum Gleason score was 4+5, and this was seen in the left base lateral. Additionally, Gleason 4+3 was seen in the right mid lateral (small focus), Gleason 3+4 in the right apex lateral and left mid, and Gleason 3+3 in the left mid lateral (small focus), left apex lateral, left base, left apex, right mid (small focus), and right base lateral (small focus).  He underwent disease staging with a PSMA PET scan on 02/16/23 showing, within limitation of streak artifact from bilateral hip prosthetics, no evidence of activity outside of the prostate. He met with Dr. Berneice Heinrich in consult on 03/01/23 to discuss surgical treatment options and has kindly been referred today for discussion of  potential radiation treatment options. He is accompanied by his wife, Evan Brock, for today's visit.  PREVIOUS RADIATION THERAPY: No  PAST MEDICAL HISTORY:  Past Medical History:  Diagnosis Date   Arthritis    "hips" (10/01/2013)   Asthma    COPD (chronic obstructive pulmonary disease) (HCC)    Coronary artery disease    Exertional shortness of breath    Hyperlipidemia    Hypertension       PAST SURGICAL HISTORY: Past Surgical History:  Procedure Laterality Date   CORONARY STENT INTERVENTION N/A 02/16/2022   Procedure: CORONARY STENT INTERVENTION;  Surgeon: Elder Negus, MD;  Location: [facility];  Service: Cardiovascular;  Laterality: N/A;   CORONARY THROMBECTOMY N/A 02/16/2022   Procedure: Coronary Thrombectomy;  Surgeon: Elder Negus, MD;  Location: [facility];  Service: Cardiovascular;  Laterality: N/A;   JOINT REPLACEMENT     LEFT HEART CATH AND CORONARY ANGIOGRAPHY N/A 02/16/2022   Procedure: LEFT HEART CATH AND CORONARY ANGIOGRAPHY;  Surgeon: Elder Negus, MD;  Location: [facility];  Service: Cardiovascular;  Laterality: N/A;   PROSTATE BIOPSY     TOTAL HIP ARTHROPLASTY Right 10/23/2008   TOTAL HIP ARTHROPLASTY Left 08/19/2013   Procedure: TOTAL HIP ARTHROPLASTY ANTERIOR APPROACH;  Surgeon: Velna Ochs, MD;  Location: [facility];  Service: Orthopedics;  Laterality: Left;    FAMILY HISTORY: They live in [place] and have 5 grandchildren with them in the home, between the ages of 51 - 11 years old.  Family History  Problem Relation Age of Onset   Diabetes Mother    Hypertension Father    Lupus Sister     SOCIAL HISTORY:  Social History   Socioeconomic History   Marital status: Married    Spouse name: Not on file   Number of children: 0   Years of education: Not on file   Highest education level: Not on file  Occupational History   Not on file  Tobacco Use   Smoking status: Former    Packs/day: 0.00    Years:  0.00    Additional pack years: 0.00    Total pack years: 0.00    Types: Cigarettes    Quit date: 10/23/1990    Years since quitting: 32.3   Smokeless tobacco: Never   Tobacco comments:    10/01/2013 "quit smoking cigarettes~ 15-20 yr ago"  Vaping Use   Vaping Use: Never used  Substance and Sexual Activity   Alcohol use: Yes    Alcohol/week: 3.0 standard drinks of alcohol    Types: 3 Cans of beer per week    Comment: 6pack in 2 days   Drug use: Not Currently    Types: Marijuana    Comment: 10/01/2013 "last drug use was 10 yr ago"   Sexual activity: Not Currently  Other Topics Concern   Not on file  Social History Narrative   Not on file   Social Determinants of Health   Financial Resource Strain: Not on file  Food Insecurity: No Food Insecurity (03/06/2023)   Hunger Vital Sign    Worried About Running Out of Food in the Last Year: Never true    Ran Out of Food in the Last Year: Never true  Transportation Needs: No Transportation Needs (03/06/2023)   PRAPARE - Administrator, Civil Service (Medical): No    Lack of Transportation (Non-Medical): No  Physical Activity: Not on file  Stress: Not on file  Social Connections: Not on file  Intimate Partner Violence: Not At Risk (03/06/2023)   Humiliation, Afraid, Rape, and Kick questionnaire    Fear of Current or Ex-Partner: No    Emotionally Abused: No    Physically Abused: No    Sexually Abused: No    ALLERGIES: Patient has no known allergies.  MEDICATIONS:  Current Outpatient Medications  Medication Sig Dispense Refill   tiZANidine (ZANAFLEX) 4 MG tablet      albuterol (PROVENTIL) (2.5 MG/3ML) 0.083% nebulizer solution Inhale 3 mLs (2.5 mg total) by nebulization every 4 (four) hours as needed for wheezing or shortness of breath. 90 mL 0   albuterol (VENTOLIN HFA) 108 (90 Base) MCG/ACT inhaler INHALE 1 PUFF FOUR TIMES DAILY, AS NEEDED (Patient taking differently: 2 puffs every 4 (four) hours as needed for wheezing  or shortness of breath.) 8 g 0   aspirin 81 MG EC tablet Take 1 tablet (81 mg total) by mouth daily. Swallow whole. 30 tablet 1   Blood Pressure Monitor KIT 1 kit by Does not apply route 3 (three) times daily as needed. 1 kit 0   gabapentin (NEURONTIN) 800 MG tablet Take 800 mg by mouth 3 (three) times daily.     HYDROcodone-acetaminophen (NORCO) 10-325 MG tablet Take by mouth.     losartan (COZAAR) 100 MG tablet TAKE 1 TABLET(100 MG) BY MOUTH DAILY 90 tablet 0   metFORMIN (GLUCOPHAGE) 500 MG tablet Take 1 tablet (500 mg total) by mouth 2 (two) times daily with a meal. Resume on Sunday 02/19/2022  180 tablet 1   naloxone (NARCAN) nasal spray 4 mg/0.1 mL CALL 911. SPR CONTENTS OF ONE SPRAYER (0.1ML) INTO ONE NOSTRIL. REPEAT IN 2-3 MIN IF SYMPTOMS OF OPIOID EMERGENCY PERSIST, ALTERNATE NOSTRILS     nitroGLYCERIN (NITROSTAT) 0.4 MG SL tablet PLACE 1 TABLET UNDER THE TONGUE EVERY 5 MINUTES AS NEEDED FOR CHEST PAIN 25 tablet 3   rosuvastatin (CRESTOR) 40 MG tablet TAKE 1 TABLET BY MOUTH AT BEDTIME (Patient taking differently: Take 40 mg by mouth at bedtime.) 90 tablet 0   tamsulosin (FLOMAX) 0.4 MG CAPS capsule Take 0.8 mg by mouth daily after supper.     ticagrelor (BRILINTA) 90 MG TABS tablet Take 1 tablet (90 mg total) by mouth 2 (two) times daily. 180 tablet 3   TRELEGY ELLIPTA 100-62.5-25 MCG/ACT AEPB Take 1 puff by mouth daily.     vitamin B-12 (CYANOCOBALAMIN) 50 MCG tablet Take 50 mcg by mouth daily.     No current facility-administered medications for this encounter.    REVIEW OF SYSTEMS:  On review of systems, the patient reports that he is doing well overall. He denies any chest pain, shortness of breath, cough, fevers, chills, night sweats, unintended weight changes. He denies any bowel disturbances, and denies abdominal pain, nausea or vomiting. He denies any new musculoskeletal or joint aches or pains. His IPSS was 7, indicating mild urinary symptoms with a weaker flow of stream and  intermittency but no straining or difficulty emptying his bladder. His SHIM was 6, indicating he has severe erectile dysfunction. A complete review of systems is obtained and is otherwise negative.    PHYSICAL EXAM:  Wt Readings from Last 3 Encounters:  03/06/23 211 lb 8 oz (95.9 kg)  09/29/22 200 lb 3.2 oz (90.8 kg)  09/11/22 189 lb 9.5 oz (86 kg)   Temp Readings from Last 3 Encounters:  03/06/23 (!) 96.8 F (36 C) (Temporal)  10/19/22 98.4 F (36.9 C) (Oral)  09/11/22 97.9 F (36.6 C) (Oral)   BP Readings from Last 3 Encounters:  03/06/23 (!) 146/87  10/19/22 (!) 147/98  09/29/22 (!) 138/90   Pulse Readings from Last 3 Encounters:  03/06/23 71  10/19/22 72  09/29/22 78   Pain Assessment Pain Score: 0-No pain/10  In general this is a well appearing African American male in no acute distress. He's alert and oriented x4 and appropriate throughout the examination. Cardiopulmonary assessment is negative for acute distress, and he exhibits normal effort.     KPS = 90  100 - Normal; no complaints; no evidence of disease. 90   - Able to carry on normal activity; minor signs or symptoms of disease. 80   - Normal activity with effort; some signs or symptoms of disease. 85   - Cares for self; unable to carry on normal activity or to do active work. 60   - Requires occasional assistance, but is able to care for most of his personal needs. 50   - Requires considerable assistance and frequent medical care. 40   - Disabled; requires special care and assistance. 30   - Severely disabled; hospital admission is indicated although death not imminent. 20   - Very sick; hospital admission necessary; active supportive treatment necessary. 10   - Moribund; fatal processes progressing rapidly. 0     - Dead  Karnofsky DA, Abelmann WH, Craver LS and Burchenal [facility] [phone]) The use of the nitrogen mustards in the palliative treatment of carcinoma: with particular reference to bronchogenic carcinoma  Cancer  1 [id number]  LABORATORY DATA:  Lab Results  Component Value Date   WBC 11.6 (H) 09/11/2022   HGB 12.3 (L) 09/11/2022   HCT 38.9 (L) 09/11/2022   MCV 82.9 09/11/2022   PLT 321 09/11/2022   Lab Results  Component Value Date   NA 137 09/11/2022   K 3.9 09/11/2022   CL 99 09/11/2022   CO2 29 09/11/2022   Lab Results  Component Value Date   ALT 16 09/10/2022   AST 15 09/10/2022   ALKPHOS 49 09/10/2022   BILITOT 0.3 09/10/2022     RADIOGRAPHY: NM PET (PSMA) SKULL TO MID THIGH  Result Date: 02/19/2023 CLINICAL DATA:  Prostate cancer. PSA equal 5.0. High-grade prostate cancer EXAM: NUCLEAR MEDICINE PET SKULL BASE TO THIGH TECHNIQUE: 9.5 mCi F18 Piflufolastat (Pylarify) was injected intravenously. Full-ring PET imaging was performed from the skull base to thigh after the radiotracer. CT data was obtained and used for attenuation correction and anatomic localization. COMPARISON:  None Available. FINDINGS: NECK No radiotracer activity in neck lymph nodes. Incidental CT finding: None. CHEST No radiotracer accumulation within mediastinal or hilar lymph nodes. No suspicious pulmonary nodules on the CT scan. Incidental CT finding: None. ABDOMEN/PELVIS Prostate: There is streak artifact through the pelvis generated by the bilateral hip prosthetics. Difficult evaluate prostate gland. Several foci of mild radiotracer activity within the prostate gland. Lymph nodes: No abnormal radiotracer accumulation within pelvic or abdominal nodes. Bilateral operator lymph nodes without radiotracer activity. Liver: No evidence of liver metastasis. Incidental CT finding: None. SKELETON No focal activity to suggest skeletal metastasis. IMPRESSION: 1. Mild activity within the prostate gland is indeterminate. 2. Some limitation of evaluation the pelvis due to streak artifact from the bilateral hip prosthetics. 3. No metastatic adenopathy identified. 4. No visceral metastasis or skeletal metastasis. Electronically Signed    By: Genevive Bi M.D.   On: 02/19/2023 18:18      IMPRESSION/PLAN: 1. 72 y.o. gentleman with Stage T1c adenocarcinoma of the prostate with Gleason Score of 4+5, and PSA of 5.1. We discussed the patient's workup and outlined the nature of prostate cancer in this setting. The patient's T stage, Gleason's score, and PSA put him into the high risk group. Accordingly, he is eligible for a variety of potential treatment options including prostatectomy or LT-ADT concurrent with either 8 weeks of external radiation or 5 weeks of external radiation with an upfront brachytherapy boost. We discussed the available radiation techniques, and focused on the details and logistics of delivery. He is not interested in brachytherapy boost due to having young grandchildren in the home and his concern for their exposure risks.  Therefore, we discussed and outlined the risks, benefits, short and long-term effects associated with daily external beam radiotherapy and compared and contrasted these with prostatectomy. We discussed the role of SpaceOAR gel in reducing the rectal toxicity associated with radiotherapy. We also detailed the role of ADT in the treatment of high risk prostate cancer and outlined the associated side effects that could be expected with this therapy. He appears to have a good understanding of his disease and our treatment recommendations which are of curative intent.  He and his wife were encouraged to ask questions that were answered to their stated satisfaction.  At the conclusion of our conversation, the patient remains undecided regarding his final treatment preference and would like to take some additional time to carefully consider his options prior to committing treatment. We will share our discussion with [name] and await his final  decision. He has our contact information and will let us know if he ultimately decides to proceed with LT-ADT and daily external beam radiation so that we can proceed  with treatment planning accordingly at that time. We enjoyed meeting him and his wife, Evan Brock, today and look forward to continuing to participate in his care. They know that they are welcome to call at any time with any questions or concerns related to the treatment options discussed today.  We personally spent 70 minutes in this encounter including chart review, reviewing radiological studies, meeting face-to-face with the patient, entering orders and completing documentation.    Marguarite Arbour, PA-C    Margaretmary Dys, MD  [facility]  Radiation Oncology Direct Dial: [phone]  Fax: [phone] [email or web address]  Skype  LinkedIn   This document serves as a record of services personally performed by Margaretmary Dys, MD and Marcello Fennel, PA-C. It was created on their behalf by Mickie Bail, a trained medical scribe. The creation of this record is based on the scribe's personal observations and the provider's statements to them. This document has been checked and approved by the attending provider.

## 2023-03-06 NOTE — Progress Notes (Signed)
Introduced myself to the patient, and his wife, as the prostate nurse navigator.  No barriers to care identified at this time.  He is here to discuss his radiation treatment options.  I gave him my business card and asked him to call me with questions or concerns.  Verbalized understanding.  

## 2023-03-13 NOTE — Progress Notes (Signed)
Patient was a consult on 5/14 for his stage T1c adenocarcinoma of the prostate with Gleason score of 4+5, and PSA of 5.1 and has decided to proceed with LT-ADT followed by 8 weeks of daily radiation.     MD's notified, plan of care in progress.

## 2023-03-16 ENCOUNTER — Telehealth: Payer: Self-pay | Admitting: *Deleted

## 2023-03-16 ENCOUNTER — Other Ambulatory Visit: Payer: Self-pay

## 2023-03-16 DIAGNOSIS — I2510 Atherosclerotic heart disease of native coronary artery without angina pectoris: Secondary | ICD-10-CM

## 2023-03-16 DIAGNOSIS — Z955 Presence of coronary angioplasty implant and graft: Secondary | ICD-10-CM

## 2023-03-16 DIAGNOSIS — I214 Non-ST elevation (NSTEMI) myocardial infarction: Secondary | ICD-10-CM

## 2023-03-16 MED ORDER — TICAGRELOR 90 MG PO TABS: 90.0000 mg | ORAL_TABLET | Freq: Two times a day (BID) | ORAL | 3 refills | Status: DC

## 2023-03-16 NOTE — Telephone Encounter (Signed)
CALLED PATIENT TO INFORM OF ADT APPT.ON 04-25-23 - ARRIVAL TIME- 7:45 AM @ DR. PACE'S OFFICE @ [facility], SPOKE WITH PATIENT AND HE IS AWARE OF THIS APPT.

## 2023-03-22 NOTE — Progress Notes (Signed)
RN left message for call back regarding upcoming ADT appointment.

## 2023-03-23 NOTE — Progress Notes (Signed)
RN left message for call back.  

## 2023-03-26 NOTE — Progress Notes (Signed)
RN spoke with triage nurse at [facility] to request patient's ADT appointment moved up.  Triage nurse will review with MD and call back.   

## 2023-03-29 NOTE — Progress Notes (Signed)
RN spoke with triage nurse at [facility] to follow up with request to move up ADT appointment.   Per triage nurse, still awaiting MD response.  They will reach out again to MD to expedite appointment.  Will continue to follow.

## 2023-03-30 ENCOUNTER — Encounter: Payer: Self-pay | Admitting: Cardiology

## 2023-03-30 ENCOUNTER — Ambulatory Visit: Payer: Medicare HMO | Admitting: Cardiology

## 2023-03-30 ENCOUNTER — Other Ambulatory Visit: Payer: Self-pay | Admitting: Cardiology

## 2023-03-30 VITALS — BP 151/97 | HR 76 | Ht 69.0 in | Wt 213.0 lb

## 2023-03-30 DIAGNOSIS — E782 Mixed hyperlipidemia: Secondary | ICD-10-CM

## 2023-03-30 DIAGNOSIS — Z955 Presence of coronary angioplasty implant and graft: Secondary | ICD-10-CM

## 2023-03-30 DIAGNOSIS — I10 Essential (primary) hypertension: Secondary | ICD-10-CM

## 2023-03-30 DIAGNOSIS — I491 Atrial premature depolarization: Secondary | ICD-10-CM

## 2023-03-30 DIAGNOSIS — I214 Non-ST elevation (NSTEMI) myocardial infarction: Secondary | ICD-10-CM

## 2023-03-30 DIAGNOSIS — I2510 Atherosclerotic heart disease of native coronary artery without angina pectoris: Secondary | ICD-10-CM

## 2023-03-30 DIAGNOSIS — I77810 Thoracic aortic ectasia: Secondary | ICD-10-CM

## 2023-03-30 DIAGNOSIS — E119 Type 2 diabetes mellitus without complications: Secondary | ICD-10-CM

## 2023-03-30 DIAGNOSIS — Z87891 Personal history of nicotine dependence: Secondary | ICD-10-CM

## 2023-03-30 DIAGNOSIS — J449 Chronic obstructive pulmonary disease, unspecified: Secondary | ICD-10-CM

## 2023-03-30 NOTE — Progress Notes (Signed)
Patient is now scheduled for his ADT with [name] on 6/14.   RN called patient, no answer, no option to leave voicemail.  Will follow up to ensure he is aware of appointment date change.

## 2023-03-30 NOTE — Progress Notes (Signed)
ID:  Evan Brock, DOB 15-Oct-1951, MRN [id number]  PCP:  Evan Noble, PA-C  Cardiologist:  Evan Lerner, DO, [facility] (established care February 16, 2022)  Date: 03/30/23 Last Office Visit: September 29, 2022  Chief Complaint  Patient presents with   Coronary Artery Disease   Follow-up    HPI  Evan Brock is a 72 y.o. 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  Patient presented to the hospital in April 2023 and ruled in as an NSTEMI he was noted to have obstructive disease on angiography and underwent aspiration thrombectomy status post PTCA and stenting to the LCx.  During the same hospitalization he was noted to have mildly dilated ascending aorta at 41 mm per echocardiography.  Patient was educated on importance of improving his modifiable cardiovascular risk factors.  He presents today for 21-month follow-up visit.  He has had some atypical precordial discomfort for which she took sublingual nitroglycerin tablets (1 tablet over 4 occurrences).  The precordial discomfort was not his anginal equivalent.  Office blood pressures today are not well-controlled, did not take his morning blood pressure medications.  Otherwise home readings are around 125-135 mmHg per patient.  I do not have a log available for review.  Patient has gained approximately 13 pounds since last 6 months which she is attributing to steroid use for his underlying COPD.  Shortness of breath with exertion remains chronic and stable.  Patient was supposed to have a CT of the chest to reevaluate the dimensions of the ascending aorta however this is still pending.   ALLERGIES: No Known  Allergies  MEDICATION LIST PRIOR TO VISIT: Current Meds  Medication Sig   albuterol (PROVENTIL) (2.5 MG/3ML) 0.083% nebulizer solution Inhale 3 mLs (2.5 mg total) by nebulization every 4 (four) hours as needed for wheezing or shortness of breath.   albuterol (VENTOLIN HFA) 108 (90 Base) MCG/ACT inhaler INHALE 1 PUFF FOUR TIMES DAILY, AS NEEDED (Patient taking differently: 2 puffs every 4 (four) hours as needed for wheezing or shortness of breath.)   aspirin 81 MG EC tablet Take 1 tablet (81 mg total) by mouth daily. Swallow whole.   Blood Pressure Monitor KIT 1 kit by Does not apply route 3 (three) times daily as needed.   gabapentin (NEURONTIN) 800 MG tablet Take 800 mg by mouth 3 (three) times daily.   HYDROcodone-acetaminophen (NORCO) 10-325 MG tablet Take by mouth.   losartan (COZAAR) 100 MG tablet TAKE 1 TABLET(100 MG) BY MOUTH DAILY   metFORMIN (GLUCOPHAGE) 500 MG tablet Take 1 tablet (500 mg total) by mouth 2 (two) times daily with a meal. Resume on Sunday 02/19/2022   naloxone (NARCAN) nasal spray 4 mg/0.1 mL CALL 911. SPR CONTENTS OF ONE SPRAYER (0.1ML) INTO ONE NOSTRIL. REPEAT IN 2-3 MIN IF SYMPTOMS OF OPIOID EMERGENCY PERSIST, ALTERNATE NOSTRILS   nitroGLYCERIN (NITROSTAT) 0.4 MG SL tablet PLACE 1 TABLET UNDER THE TONGUE EVERY 5 MINUTES AS NEEDED FOR CHEST PAIN   rosuvastatin (CRESTOR) 40 MG tablet TAKE 1 TABLET BY MOUTH AT BEDTIME (Patient taking differently: Take 40 mg by mouth at bedtime.)   tamsulosin (FLOMAX) 0.4 MG CAPS capsule Take 0.8 mg by mouth daily after supper.   ticagrelor (BRILINTA) 90 MG TABS tablet Take 1 tablet (  90 mg total) by mouth 2 (two) times daily.   tiZANidine (ZANAFLEX) 4 MG tablet    TRELEGY ELLIPTA 100-62.5-25 MCG/ACT AEPB Take 1 puff by mouth daily.   vitamin B-12 (CYANOCOBALAMIN) 50 MCG tablet Take 50 mcg by mouth daily.     PAST MEDICAL HISTORY: Past Medical History:  Diagnosis Date   Arthritis    "hips" (10/01/2013)   Asthma    COPD (chronic  obstructive pulmonary disease) (HCC)    Coronary artery disease    Exertional shortness of breath    Hyperlipidemia    Hypertension     PAST SURGICAL HISTORY: Past Surgical History:  Procedure Laterality Date   CORONARY STENT INTERVENTION N/A 02/16/2022   Procedure: CORONARY STENT INTERVENTION;  Surgeon: Elder Negus, MD;  Location: [facility];  Service: Cardiovascular;  Laterality: N/A;   CORONARY THROMBECTOMY N/A 02/16/2022   Procedure: Coronary Thrombectomy;  Surgeon: Elder Negus, MD;  Location: [facility];  Service: Cardiovascular;  Laterality: N/A;   JOINT REPLACEMENT     LEFT HEART CATH AND CORONARY ANGIOGRAPHY N/A 02/16/2022   Procedure: LEFT HEART CATH AND CORONARY ANGIOGRAPHY;  Surgeon: Elder Negus, MD;  Location: [facility];  Service: Cardiovascular;  Laterality: N/A;   PROSTATE BIOPSY     TOTAL HIP ARTHROPLASTY Right 10/23/2008   TOTAL HIP ARTHROPLASTY Left 08/19/2013   Procedure: TOTAL HIP ARTHROPLASTY ANTERIOR APPROACH;  Surgeon: Velna Ochs, MD;  Location: [facility] OR;  Service: Orthopedics;  Laterality: Left;    FAMILY HISTORY: The patient family history includes Diabetes in his mother; Hypertension in his father; Lupus in his sister.  SOCIAL HISTORY:  The patient  reports that he quit smoking about 32 years ago. His smoking use included cigarettes. He has never used smokeless tobacco. He reports current alcohol use of about 3.0 standard drinks of alcohol per week. He reports that he does not currently use drugs after having used the following drugs: Marijuana.  REVIEW OF SYSTEMS: Review of Systems  Cardiovascular:  Negative for chest pain, claudication, dyspnea on exertion, leg swelling, near-syncope, orthopnea, palpitations, paroxysmal nocturnal dyspnea and syncope.  Respiratory:  Negative for shortness of breath.   Hematologic/Lymphatic: Negative for bleeding problem.    PHYSICAL EXAM:    03/30/2023   10:51 AM  03/30/2023   10:47 AM 03/06/2023    9:30 AM  Vitals with BMI  Height  5\' 9"  5\' 9"   Weight  213 lbs 211 lbs 8 oz  BMI  31.44 31.22  Systolic 151 156 161  Diastolic 97 91 87  Pulse 76 76 71   Physical Exam  Constitutional: No distress.  Age appropriate, hemodynamically stable.   Neck: No JVD present.  Cardiovascular: Normal rate, regular rhythm, S1 normal, S2 normal, intact distal pulses and normal pulses. Exam reveals no gallop, no S3 and no S4.  No murmur heard. Pulmonary/Chest: Effort normal and breath sounds normal. No stridor. He has no wheezes. He has no rales.  Abdominal: Soft. Bowel sounds are normal. He exhibits no distension. There is no abdominal tenderness.  Musculoskeletal:        General: No edema.     Cervical back: Neck supple.  Neurological: He is alert and oriented to person, place, and time. He has intact cranial nerves (2-12).  Skin: Skin is warm and moist.   CARDIAC DATABASE: EKG: 03/03/2022: Normal sinus rhythm, 80 bpm, without underlying injury pattern 09/29/2022: Sinus rhythm, 84 bpm, PACs, without underlying injury pattern. March 30, 2023: Sinus rhythm, 78 bpm, without underlying ischemia or injury pattern, frequent PACs.  Echocardiogram: 02/17/2022: LVEF 60 to 65%, mild LVH, normal diastolic function, moderate pulmonary hypertension, moderate MR eccentric jet to his anterior mitral valve leaflet, trileaflet aortic valve without stenosis or regurgitation, mild dilatation of the ascending aorta 41 mm, RAP 8 mmHg   Heart Catheterization: 02/16/2022 LM: Normal LAD: No significant disease RCA: Prox and distal 30% disease Lcx: Large vessel with prox occlusion with organized heavy thrombus (Culprit)   Successful percutaneous coronary intervention prox Lcx     Aspiration thrombectomy     PTCA and stent placement 4.0 X 16 mm Synergy drug-eluting stent     Post dilatation with 4.0X8 mm Ripley balloon up to 22 atm   100%--->0% residual stenosis TIMI flow 0-->II    Distal embolization due to late presentation.  Recommend aggrastat and nitro drip overnight.   Difficult procedure due to difficulty wiring tortuous Lcx vessel with organized thrombus requiring multiple wires and use of angled support catheter  LABORATORY DATA:    Latest Ref Rng & Units 09/11/2022    3:29 AM 09/10/2022    3:33 AM 09/09/2022    5:15 AM  CBC  WBC 4.0 - 10.5 K/uL 11.6  11.9  9.8   Hemoglobin 13.0 - 17.0 g/dL 16.1  09.6  04.5   Hematocrit 39.0 - 52.0 % 38.9  38.8  35.9   Platelets 150 - 400 K/uL 321  290  266        Latest Ref Rng & Units 09/11/2022    3:29 AM 09/10/2022    3:33 AM 09/09/2022    5:15 AM  CMP  Glucose 70 - 99 mg/dL 409  811  914   BUN 8 - 23 mg/dL 26  22  20    Creatinine 0.61 - 1.24 mg/dL 7.82  9.56  2.13   Sodium 135 - 145 mmol/L 137  137  138   Potassium 3.5 - 5.1 mmol/L 3.9  4.1  4.2   Chloride 98 - 111 mmol/L 99  100  99   CO2 22 - 32 mmol/L 29  26  28    Calcium 8.9 - 10.3 mg/dL 9.9  9.8  9.5   Total Protein 6.5 - 8.1 g/dL  6.8  6.6   Total Bilirubin 0.3 - 1.2 mg/dL  0.3  0.4   Alkaline Phos 38 - 126 U/L  49  47   AST 15 - 41 U/L  15  14   ALT 0 - 44 U/L  16  16     Lipid Panel  Lab Results  Component Value Date   CHOL 171 02/17/2022   HDL 41 02/17/2022   LDLCALC 111 (H) 02/17/2022   LDLDIRECT 102.3 (H) 02/17/2022   TRIG 95 02/17/2022   CHOLHDL 4.2 02/17/2022    No components found for: "NTPROBNP" No results for input(s): "PROBNP" in the last 8760 hours. No results for input(s): "TSH" in the last 8760 hours.   BMP Recent Labs    09/09/22 0515 09/10/22 0333 09/11/22 0329  NA 138 137 137  K 4.2 4.1 3.9  CL 99 100 99  CO2 28 26 29   GLUCOSE 122* 133* 115*  BUN 20 22 26*  CREATININE 1.49* 1.43* 1.42*  CALCIUM 9.5 9.8 9.9  GFRNONAA 50* 52* 53*    HEMOGLOBIN A1C Lab Results  Component Value Date   HGBA1C 6.9 (H) 09/08/2022   MPG 151.33 09/08/2022    IMPRESSION:    ICD-10-CM  1. Atherosclerosis of native  coronary artery of native heart without angina pectoris  I25.10 EKG 12-Lead    Lipid Panel With LDL/HDL Ratio    LDL cholesterol, direct    CMP14+EGFR    2. Status post coronary artery stent placement  Z95.5     3. Non-ST elevation (NSTEMI) myocardial infarction (HCC)  I21.4     4. PAC (premature atrial contraction)  I49.1 LONG TERM MONITOR (3-14 DAYS)    5. Essential hypertension, benign  I10     6. Ascending aorta dilatation (HCC)  I77.810     7. Non-insulin dependent type 2 diabetes mellitus (HCC)  E11.9     8. Mixed hyperlipidemia  E78.2     9. Former smoker  Z87.891     10. Chronic obstructive pulmonary disease, unspecified COPD type (HCC)  J44.9        RECOMMENDATIONS: Evan Brock is a 72 y.o. 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  Atherosclerosis of native coronary artery of native heart without angina pectoris s/p PCI Hx NSTEMI: Index event February 16, 2022. Underwent aspiration thrombectomy followed by PTCA and PCI to the culprit LCx. Currently on dual antiplatelet therapy. Has had noncardiac discomfort but due to concerns took sublingual nitroglycerin tablets (1 tablet over 4 occurrences). EKG today is nonischemic. Recently had labs with PCP, do not have them available for review.  Will request records. Reemphasized importance of secondary prevention.  Essential hypertension, benign Office blood pressures are  not at goal, did not take his morning blood pressure medications. Otherwise at home SBP ranges between 125-135 mmHg on current medical therapy. Reemphasized importance of blood pressure management given his dilated ascending aorta. Patient  is aware of the importance of low-salt diet.  Ascending aorta dilatation (HCC) 41 mm ascending aorta based on echocardiogram 02/18/2022. Renal CT scheduled for April 2024 still pending.  Reemphasized its importance.  Premature atrial contractions: Cardiac monitor to evaluate for PSVT.  Non-insulin dependent type 2 diabetes mellitus (HCC) Educated him on the importance of glycemic control. Continue metformin, Jardiance, statin therapy  Mixed hyperlipidemia Tolerating statin therapy well without any side effects or intolerance. Would recommend a goal LDL less than 55 mg/dL. Will request records from PCP. However, if unable to obtain patient is asked to have his fasting lipids checked prior to next office visit.  Labs ordered.  FINAL MEDICATION LIST END OF ENCOUNTER: No orders of the defined types were placed in this encounter.   There are no discontinued medications.    Current Outpatient Medications:    albuterol (PROVENTIL) (2.5 MG/3ML) 0.083% nebulizer solution, Inhale 3 mLs (2.5 mg total) by nebulization every 4 (four) hours as needed for wheezing or shortness of breath., Disp: 90 mL, Rfl: 0   albuterol (VENTOLIN HFA) 108 (90 Base) MCG/ACT inhaler, INHALE 1 PUFF FOUR TIMES DAILY, AS NEEDED (Patient taking differently: 2 puffs every 4 (four) hours as needed for wheezing or shortness of breath.), Disp: 8 g, Rfl: 0   aspirin 81 MG EC tablet, Take 1 tablet (81 mg total) by mouth daily. Swallow whole., Disp: 30 tablet, Rfl: 1   Blood Pressure Monitor KIT, 1 kit by Does not apply route 3 (three) times daily as needed., Disp: 1 kit, Rfl: 0  gabapentin (NEURONTIN) 800 MG tablet, Take 800 mg by mouth 3 (three) times daily., Disp: , Rfl:    HYDROcodone-acetaminophen (NORCO) 10-325 MG tablet, Take by mouth., Disp: , Rfl:    losartan (COZAAR) 100 MG tablet, TAKE 1 TABLET(100 MG) BY MOUTH DAILY, Disp: 90 tablet, Rfl: 0   metFORMIN (GLUCOPHAGE) 500 MG tablet, Take 1 tablet (500 mg total) by  mouth 2 (two) times daily with a meal. Resume on Sunday 02/19/2022, Disp: 180 tablet, Rfl: 1   naloxone (NARCAN) nasal spray 4 mg/0.1 mL, CALL 911. SPR CONTENTS OF ONE SPRAYER (0.1ML) INTO ONE NOSTRIL. REPEAT IN 2-3 MIN IF SYMPTOMS OF OPIOID EMERGENCY PERSIST, ALTERNATE NOSTRILS, Disp: , Rfl:    nitroGLYCERIN (NITROSTAT) 0.4 MG SL tablet, PLACE 1 TABLET UNDER THE TONGUE EVERY 5 MINUTES AS NEEDED FOR CHEST PAIN, Disp: 25 tablet, Rfl: 3   rosuvastatin (CRESTOR) 40 MG tablet, TAKE 1 TABLET BY MOUTH AT BEDTIME (Patient taking differently: Take 40 mg by mouth at bedtime.), Disp: 90 tablet, Rfl: 0   tamsulosin (FLOMAX) 0.4 MG CAPS capsule, Take 0.8 mg by mouth daily after supper., Disp: , Rfl:    ticagrelor (BRILINTA) 90 MG TABS tablet, Take 1 tablet (90 mg total) by mouth 2 (two) times daily., Disp: 180 tablet, Rfl: 3   tiZANidine (ZANAFLEX) 4 MG tablet, , Disp: , Rfl:    TRELEGY ELLIPTA 100-62.5-25 MCG/ACT AEPB, Take 1 puff by mouth daily., Disp: , Rfl:    vitamin B-12 (CYANOCOBALAMIN) 50 MCG tablet, Take 50 mcg by mouth daily., Disp: , Rfl:   Orders Placed This Encounter  Procedures   Lipid Panel With LDL/HDL Ratio   LDL cholesterol, direct   [id number]   LONG TERM MONITOR (3-14 DAYS)   EKG 12-Lead    There are no Patient Instructions on file for this visit.   --Continue cardiac medications as reconciled in final medication list. --Return in about 6 weeks (around 05/11/2023) for Follow-up after CT scan. Or sooner if needed. --Continue follow-up with your primary care physician regarding the management of your other chronic comorbid conditions.  Patient's questions and concerns were addressed to his satisfaction. He voices understanding of the instructions provided during this encounter.   This note was created using a voice recognition software as a result there may be grammatical errors inadvertently enclosed that do not reflect the nature of this encounter. Every attempt is made to correct  such errors.  Evan Brock, [place], [facility]  Pager:  [phone] Office: [phone]

## 2023-04-06 ENCOUNTER — Other Ambulatory Visit: Payer: Self-pay | Admitting: Urology

## 2023-04-06 NOTE — Progress Notes (Signed)
Pt received Eligard 45mg  today at [facility].

## 2023-04-19 ENCOUNTER — Telehealth: Payer: Self-pay | Admitting: *Deleted

## 2023-04-19 NOTE — Telephone Encounter (Signed)
Called patient to ask question, lvm for a return call 

## 2023-04-22 ENCOUNTER — Encounter: Payer: Self-pay | Admitting: Cardiology

## 2023-04-23 ENCOUNTER — Encounter (HOSPITAL_COMMUNITY): Payer: Self-pay

## 2023-04-23 ENCOUNTER — Ambulatory Visit (INDEPENDENT_AMBULATORY_CARE_PROVIDER_SITE_OTHER): Payer: Medicare HMO

## 2023-04-23 ENCOUNTER — Ambulatory Visit (HOSPITAL_COMMUNITY)
Admission: EM | Admit: 2023-04-23 | Discharge: 2023-04-23 | Disposition: A | Discharge status: Home / Self Care | Payer: Medicare HMO | Attending: Urgent Care | Admitting: Urgent Care

## 2023-04-23 DIAGNOSIS — R0902 Hypoxemia: Secondary | ICD-10-CM | POA: Diagnosis not present

## 2023-04-23 DIAGNOSIS — J189 Pneumonia, unspecified organism: Secondary | ICD-10-CM

## 2023-04-23 HISTORY — DX: Pneumonia, unspecified organism: J18.9

## 2023-04-23 MED ORDER — DEXAMETHASONE SODIUM PHOSPHATE 10 MG/ML IJ SOLN
INTRAMUSCULAR | Status: AC
  Filled 2023-04-23: qty 1

## 2023-04-23 MED ORDER — IPRATROPIUM-ALBUTEROL 0.5-2.5 (3) MG/3ML IN SOLN
3.0000 mL | Freq: Once | RESPIRATORY_TRACT | Status: AC
  Administered 2023-04-23: 3 mL via RESPIRATORY_TRACT

## 2023-04-23 MED ORDER — IPRATROPIUM-ALBUTEROL 0.5-2.5 (3) MG/3ML IN SOLN
RESPIRATORY_TRACT | Status: AC
  Filled 2023-04-23: qty 3

## 2023-04-23 MED ORDER — DEXAMETHASONE SODIUM PHOSPHATE 10 MG/ML IJ SOLN
10.0000 mg | Freq: Once | INTRAMUSCULAR | Status: AC
  Administered 2023-04-23: 10 mg via INTRAMUSCULAR

## 2023-04-23 NOTE — ED Provider Notes (Signed)
[facility]    CSN: [id number] Arrival date & time: 04/23/23  7829      History   Chief Complaint Chief Complaint  Patient presents with   Shortness of Breath    HPI Shihab Baechle is a 72 y.o. male.   Pleasant 72 year old male presents today due to concerns of shortness of breath.  He has a known history of asthma and COPD.  He reports every year in the summer he seems to get an exacerbation due to the heat.  Sitting still, he denies shortness of breath, but states with exertion he feels dyspneic.  Denies a history of CHF.  Denies PND, weight gain or edema, does endorse mild orthopnea. He does use Trelegy Ellipta daily in addition to his nebulizer and albuterol as needed.  He used his last neb roughly 2 hours prior to presentation today. He denies CP or palpitations. He has a home O2 meter that he uses to monitor, the lowest it has been is 90% at home. Was 82% upon presentation with exertion.   Shortness of Breath   Past Medical History:  Diagnosis Date   Arthritis    "hips" (10/01/2013)   Asthma    COPD (chronic obstructive pulmonary disease) (HCC)    Coronary artery disease    Exertional shortness of breath    Hyperlipidemia    Hypertension     Patient Active Problem List   Diagnosis Date Noted   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   Acute hypoxic respiratory failure (HCC) 09/08/2022   Status post coronary artery stent placement    S/P PTCA (percutaneous transluminal coronary angioplasty)    Non-insulin dependent type 2 diabetes mellitus (HCC)    Mixed hyperlipidemia    Coronary atherosclerosis due to calcified coronary lesion    Former smoker    Class 1 obesity due to excess calories with serious comorbidity and body mass index (BMI) of 31.0 to 31.9 in adult    Non-ST elevation (NSTEMI) myocardial infarction (HCC) 02/16/2022   NSTEMI (non-ST elevated myocardial infarction) (HCC) 02/16/2022   Acute respiratory failure with hypoxia (HCC)  11/12/2021   CAP (community acquired pneumonia) 02/16/2021   Hypoxia 02/16/2021   COPD with acute exacerbation (HCC) 02/16/2021   Diabetes mellitus type 2 in obese 02/16/2021   Acute kidney injury (HCC) 02/16/2021   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   Leukocytosis 10/04/2015   COPD (chronic obstructive pulmonary disease) (HCC) 10/01/2015   Essential hypertension, benign 03/01/2015   Asthma with acute exacerbation 01/26/2015   Thrombocytopenia, unspecified (HCC) 11/06/2013   Acute respiratory failure (HCC) with hypoxia 10/01/2013   Acute renal failure (HCC) 10/01/2013    Past Surgical History:  Procedure Laterality Date   CORONARY STENT INTERVENTION N/A 02/16/2022   Procedure: CORONARY STENT INTERVENTION;  Surgeon: Elder Negus, MD;  [facility]: [facility];  Service: Cardiovascular;  Laterality: N/A;   CORONARY THROMBECTOMY N/A 02/16/2022   Procedure: Coronary Thrombectomy;  Surgeon: Elder Negus, MD;  [facility]: [facility];  Service: Cardiovascular;  Laterality: N/A;   JOINT REPLACEMENT     LEFT HEART CATH AND CORONARY ANGIOGRAPHY N/A 02/16/2022   Procedure: LEFT HEART CATH AND CORONARY ANGIOGRAPHY;  Surgeon: Elder Negus, MD;  [facility]: [facility];  Service: Cardiovascular;  Laterality: N/A;   PROSTATE BIOPSY     TOTAL HIP ARTHROPLASTY Right 10/23/2008   TOTAL HIP ARTHROPLASTY Left 08/19/2013   Procedure: TOTAL HIP ARTHROPLASTY ANTERIOR APPROACH;  Surgeon: Velna Ochs, MD;  Location: [facility];  Service: Orthopedics;  Laterality: Left;       Home Medications    Prior to Admission medications   Medication Sig Start Date End Date Taking? Authorizing Provider  albuterol (PROVENTIL) (2.5 MG/3ML) 0.083% nebulizer solution Inhale 3 mLs (2.5 mg total) by nebulization every 4 (four) hours as needed for wheezing or shortness of breath. 09/11/22   Jerre Simon, MD  albuterol (VENTOLIN HFA) 108 (90 Base) MCG/ACT inhaler INHALE 1 PUFF  FOUR TIMES DAILY, AS NEEDED Patient taking differently: 2 puffs every 4 (four) hours as needed for wheezing or shortness of breath. 10/26/21 03/30/23  Raspet, Noberto Retort, PA-C  aspirin 81 MG EC tablet Take 1 tablet (81 mg total) by mouth daily. Swallow whole. 02/17/22   Patwardhan, Anabel Bene, MD  Blood Pressure Monitor KIT 1 kit by Does not apply route 3 (three) times daily as needed. 11/05/19   Grayce Sessions, NP  gabapentin (NEURONTIN) 800 MG tablet Take 800 mg by mouth 3 (three) times daily. 02/01/22   [provider]  HYDROcodone-acetaminophen (NORCO) 10-325 MG tablet Take by mouth.    [provider]  losartan (COZAAR) 100 MG tablet TAKE 1 TABLET(100 MG) BY MOUTH DAILY 03/01/23   Tolia, Sunit, DO  metFORMIN (GLUCOPHAGE) 500 MG tablet Take 1 tablet (500 mg total) by mouth 2 (two) times daily with a meal. Resume on Sunday 02/19/2022 02/17/22   Patwardhan, Anabel Bene, MD  naloxone (NARCAN) nasal spray 4 mg/0.1 mL CALL 911. SPR CONTENTS OF ONE SPRAYER (0.1ML) INTO ONE NOSTRIL. REPEAT IN 2-3 MIN IF SYMPTOMS OF OPIOID EMERGENCY PERSIST, ALTERNATE NOSTRILS    [provider]  nitroGLYCERIN (NITROSTAT) 0.4 MG SL tablet PLACE 1 TABLET UNDER THE TONGUE EVERY 5 MINUTES AS NEEDED FOR CHEST PAIN 03/01/23   Tolia, Sunit, DO  rosuvastatin (CRESTOR) 40 MG tablet TAKE 1 TABLET BY MOUTH AT BEDTIME Patient taking differently: Take 40 mg by mouth at bedtime. 07/07/22   Tolia, Sunit, DO  tamsulosin (FLOMAX) 0.4 MG CAPS capsule Take 0.8 mg by mouth daily after supper. 11/09/21   [provider]  ticagrelor (BRILINTA) 90 MG TABS tablet Take 1 tablet (90 mg total) by mouth 2 (two) times daily. 03/16/23 03/15/24  Tolia, Sunit, DO  tiZANidine (ZANAFLEX) 4 MG tablet  02/15/23   [provider]  TRELEGY ELLIPTA 100-62.5-25 MCG/ACT AEPB Take 1 puff by mouth daily. 12/21/21   [provider]  vitamin B-12 (CYANOCOBALAMIN) 50 MCG tablet Take 50 mcg by mouth daily.    [provider]     Family History Family History  Problem Relation Age of Onset   Diabetes Mother    Hypertension Father    Lupus Sister     Social History Social History   Tobacco Use   Smoking status: Former    Packs/day: 0.00    Years: 0.00    Additional pack years: 0.00    Total pack years: 0.00    Types: Cigarettes    Quit date: 10/23/1990    Years since quitting: 32.5   Smokeless tobacco: Never   Tobacco comments:    10/01/2013 "quit smoking cigarettes~ 15-20 yr ago"  Vaping Use   Vaping Use: Never used  Substance Use Topics   Alcohol use: Yes    Alcohol/week: 3.0 standard drinks of alcohol    Types: 3 Cans of beer per week    Comment: 6pack in 2 days   Drug use: Not Currently    Types: Marijuana  Comment: 10/01/2013 "last drug use was 10 yr ago"     Allergies   Patient has no known allergies.   Review of Systems Review of Systems  Respiratory:  Positive for shortness of breath.   As per HPI   Physical Exam Triage Vital Signs ED Triage Vitals [04/23/23 0926]  Enc Vitals Group     BP (!) 174/96     Pulse Rate 88     Resp (!) 24     Temp 98.3 F (36.8 C)     Temp Source Oral     SpO2 (!) 89 %     Weight      Height      Head Circumference      Peak Flow      Pain Score 0     Pain Loc      Pain Edu?      Excl. in GC?    No data found.  Updated Vital Signs BP (!) 174/96 (BP Location: Left Arm)   Pulse 88   Temp 98.3 F (36.8 C) (Oral)   Resp (!) 24   SpO2 (!) 89%   Visual Acuity Right Eye Distance:   Left Eye Distance:   Bilateral Distance:    Right Eye Near:   Left Eye Near:    Bilateral Near:     Physical Exam Vitals and nursing note reviewed.  Constitutional:      General: He is not in acute distress.    Appearance: He is well-developed. He is obese. He is not ill-appearing, toxic-appearing or diaphoretic.  HENT:     Head: Normocephalic and atraumatic.     Mouth/Throat:     Mouth: Mucous membranes are moist.  Cardiovascular:      Rate and Rhythm: Normal rate and regular rhythm.  Pulmonary:     Effort: No respiratory distress.     Breath sounds: No stridor. Decreased breath sounds present.     Comments: Minimally short of breath but no accessory muscle usage Chest:     Chest wall: No tenderness or edema. There is no dullness to percussion.  Musculoskeletal:     Cervical back: Normal range of motion and neck supple.     Right lower leg: No edema.     Left lower leg: No edema.  Lymphadenopathy:     Cervical: No cervical adenopathy.  Skin:    General: Skin is warm and dry.  Neurological:     General: No focal deficit present.     Mental Status: He is alert and oriented to person, place, and time.      UC Treatments / Results  Labs (all labs ordered are listed, but only abnormal results are displayed) Labs Reviewed - No data to display  EKG   Radiology DG Chest 2 View  Result Date: 04/23/2023 CLINICAL DATA:  Shortness of breath, COPD EXAM: CHEST - 2 VIEW COMPARISON:  Previous studies including chest radiographs done on 10/19/2022 FINDINGS: Transverse diameter of heart is increased. There are no signs of pulmonary edema. There is subtle increase in markings in left parahilar region and left lower lung field. There is no pleural effusion or pneumothorax. IMPRESSION: Small patchy densities are seen in left parahilar region and left lower lung fields suggesting possible atelectasis/pneumonia. Cardiomegaly. There is no pleural effusion. Electronically Signed   By: Ernie Avena M.D.   On: 04/23/2023 10:58    Procedures Procedures (including critical care time)  Medications Ordered in UC Medications  ipratropium-albuterol (DUONEB) 0.5-2.5 (  3) MG/3ML nebulizer solution 3 mL (3 mLs Nebulization Given 04/23/23 1016)  dexamethasone (DECADRON) injection 10 mg (10 mg Intramuscular Given 04/23/23 1016)    Initial Impression / Assessment and Plan / UC Course  I have reviewed the triage vital signs and the nursing  notes.  Pertinent labs & imaging results that were available during my care of the patient were reviewed by me and considered in my medical decision making (see chart for details).  Clinical Course as of 04/23/23 1157  Mon Apr 23, 2023  1022 Recheck O2 91% prior to neb [WC]    Clinical Course User Index [WC] [employer], Katanya Schlie L, PA    Pneumonia LLL -chest x-ray reveals a left lower lobe pneumonia.  After nebulizer solution was given, patient's oxygen saturations dropped down to 88%. He was hospitalized in Nov 2023 for the same thing. I recommended ER workup, also recommended [facility] transport given his current vital signs. Pt declined transportation and states he would present to the ER himself. Pt understands failure to seek further care can lead to detrimental outcomes. Hypoxia - no cyanosis noted, no distress. [facility] recommended, pt refused. Strongly encouraged pt to present to ER immediately for further workup and intervention.    Final Clinical Impressions(s) / UC Diagnoses   Final diagnoses:  Pneumonia of left lower lobe due to infectious organism  Hypoxia     Discharge Instructions      Due to your shortness of breath, worsening oxygen sats after nebulizer, and xray findings concerning for pneumonia, please head to the Emergency Room. You need a more extensive workup and treatment than we are capable of providing safely through the Urgent Care.   Please head to the ER now.     ED Prescriptions   None    PDMP not reviewed this encounter.   Maretta Bees, [place] 04/23/23 1201

## 2023-04-23 NOTE — ED Triage Notes (Signed)
Pt c/o copd exacerbation since yesterday. Pt has labored breathing and SOB on exertion with sats 82%. Pt states took a neb tx an hour ago with relief.

## 2023-04-23 NOTE — ED Notes (Signed)
Patient is being discharged from the Urgent Care and sent to the Emergency Department via POV . Per [place], [place], patient is in need of higher level of care due to SOB/PNA Patient is aware and verbalizes understanding of plan of care.  Vitals:   04/23/23 0926  BP: (!) 174/96  Pulse: 88  Resp: (!) 24  Temp: 98.3 F (36.8 C)  SpO2: (!) 89%

## 2023-04-23 NOTE — Discharge Instructions (Signed)
Due to your shortness of breath, worsening oxygen sats after nebulizer, and xray findings concerning for pneumonia, please head to the Emergency Room. You need a more extensive workup and treatment than we are capable of providing safely through the Urgent Care.   Please head to the ER now.

## 2023-04-24 ENCOUNTER — Other Ambulatory Visit: Payer: Self-pay | Admitting: Urology

## 2023-04-25 ENCOUNTER — Other Ambulatory Visit: Payer: Self-pay

## 2023-04-25 DIAGNOSIS — I10 Essential (primary) hypertension: Secondary | ICD-10-CM

## 2023-05-01 LAB — CMP14+EGFR
ALT: 19 IU/L (ref 0–44)
AST: 18 IU/L (ref 0–40)
Albumin: 4 g/dL (ref 3.8–4.8)
Alkaline Phosphatase: 66 IU/L (ref 44–121)
BUN/Creatinine Ratio: 13 (ref 10–24)
BUN: 17 mg/dL (ref 8–27)
Bilirubin Total: 0.4 mg/dL (ref 0.0–1.2)
CO2: 26 mmol/L (ref 20–29)
Calcium: 9.9 mg/dL (ref 8.6–10.2)
Chloride: 101 mmol/L (ref 96–106)
Creatinine, Ser: 1.27 mg/dL (ref 0.76–1.27)
Globulin, Total: 2.9 g/dL (ref 1.5–4.5)
Glucose: 130 mg/dL — ABNORMAL HIGH (ref 70–99)
Potassium: 5 mmol/L (ref 3.5–5.2)
Sodium: 140 mmol/L (ref 134–144)
Total Protein: 6.9 g/dL (ref 6.0–8.5)
eGFR: 60 mL/min/{1.73_m2} (ref >59)

## 2023-05-01 LAB — LIPID PANEL WITH LDL/HDL RATIO
Cholesterol, Total: 111 mg/dL (ref 100–199)
HDL: 46 mg/dL (ref >39)
LDL Chol Calc (NIH): 53 mg/dL (ref 0–99)
LDL/HDL Ratio: 1.2 ratio (ref 0.0–3.6)
Triglycerides: 49 mg/dL (ref 0–149)
VLDL Cholesterol Cal: 12 mg/dL (ref 5–40)

## 2023-05-01 LAB — LDL CHOLESTEROL, DIRECT: LDL Direct: 50 mg/dL (ref 0–99)

## 2023-05-02 ENCOUNTER — Other Ambulatory Visit: Payer: Self-pay | Admitting: Cardiology

## 2023-05-08 NOTE — Progress Notes (Signed)
Called patient to inform him about his lab results. Patient understood and will make an appt for his CT

## 2023-05-10 ENCOUNTER — Ambulatory Visit: Payer: Self-pay | Admitting: Urology

## 2023-05-10 ENCOUNTER — Ambulatory Visit: Payer: Medicare HMO | Admitting: Radiation Oncology

## 2023-05-11 ENCOUNTER — Ambulatory Visit: Payer: Medicare HMO | Admitting: Cardiology

## 2023-05-11 ENCOUNTER — Other Ambulatory Visit: Payer: Self-pay

## 2023-05-11 DIAGNOSIS — I491 Atrial premature depolarization: Secondary | ICD-10-CM

## 2023-05-11 MED ORDER — LOSARTAN POTASSIUM 100 MG PO TABS: 100.0000 mg | ORAL_TABLET | Freq: Every day | ORAL | 3 refills | Status: DC

## 2023-05-14 ENCOUNTER — Encounter (HOSPITAL_BASED_OUTPATIENT_CLINIC_OR_DEPARTMENT_OTHER): Payer: Self-pay | Admitting: Urology

## 2023-05-14 NOTE — Progress Notes (Signed)
Reviewed pt chart for pre-op interview.  Noted pt had urgent care visit on 04-23-2023 dx pneumonia with hypoxia, O2 sats in 82% on arrival and after nebulizer treatment O2 sat 89%.  Doctor recommended pt be transferred by EMS to ED for higher level of care, pt refused, stated he will go to ED himself.  No documentation of visit to ED in epic or care everywhere.  Per anesthesia guidelines pt has to be 6 weeks out of diagnosis and 2 weeks asymptomatic prior to surgery.  Reviewed chart w/ anesthesia, Dr Aleene Davidson MDA,  stated pt is not 6 weeks out and will need to have pcp clearance prior to surgery. Called and spoke [place], [place] scheduler for Dr Marlou Porch, informed her of Dr Aleene Davidson recommendation to be rescheduled.

## 2023-05-17 ENCOUNTER — Other Ambulatory Visit: Payer: Self-pay

## 2023-05-17 ENCOUNTER — Emergency Department (HOSPITAL_COMMUNITY)
Admission: EM | Admit: 2023-05-17 | Discharge: 2023-05-17 | Disposition: A | Discharge status: Home / Self Care | Payer: Medicare HMO | Attending: Emergency Medicine | Admitting: Emergency Medicine

## 2023-05-17 ENCOUNTER — Encounter (HOSPITAL_COMMUNITY): Payer: Self-pay | Admitting: Emergency Medicine

## 2023-05-17 ENCOUNTER — Emergency Department (HOSPITAL_COMMUNITY): Payer: Medicare HMO

## 2023-05-17 DIAGNOSIS — E119 Type 2 diabetes mellitus without complications: Secondary | ICD-10-CM | POA: Diagnosis not present

## 2023-05-17 DIAGNOSIS — Z79899 Other long term (current) drug therapy: Secondary | ICD-10-CM | POA: Diagnosis not present

## 2023-05-17 DIAGNOSIS — I2510 Atherosclerotic heart disease of native coronary artery without angina pectoris: Secondary | ICD-10-CM | POA: Diagnosis not present

## 2023-05-17 DIAGNOSIS — Z7984 Long term (current) use of oral hypoglycemic drugs: Secondary | ICD-10-CM | POA: Diagnosis not present

## 2023-05-17 DIAGNOSIS — Z7951 Long term (current) use of inhaled steroids: Secondary | ICD-10-CM | POA: Insufficient documentation

## 2023-05-17 DIAGNOSIS — Z7952 Long term (current) use of systemic steroids: Secondary | ICD-10-CM | POA: Diagnosis not present

## 2023-05-17 DIAGNOSIS — I10 Essential (primary) hypertension: Secondary | ICD-10-CM | POA: Insufficient documentation

## 2023-05-17 DIAGNOSIS — J441 Chronic obstructive pulmonary disease with (acute) exacerbation: Secondary | ICD-10-CM | POA: Diagnosis not present

## 2023-05-17 DIAGNOSIS — R0602 Shortness of breath: Secondary | ICD-10-CM | POA: Diagnosis present

## 2023-05-17 DIAGNOSIS — Z7982 Long term (current) use of aspirin: Secondary | ICD-10-CM | POA: Diagnosis not present

## 2023-05-17 LAB — CBC
HCT: 43.7 % (ref 39.0–52.0)
Hemoglobin: 13.2 g/dL (ref 13.0–17.0)
MCH: 26.5 pg (ref 26.0–34.0)
MCHC: 30.2 g/dL (ref 30.0–36.0)
MCV: 87.6 fL (ref 80.0–100.0)
Platelets: 221 10*3/uL (ref 150–400)
RBC: 4.99 MIL/uL (ref 4.22–5.81)
RDW: 14.6 % (ref 11.5–15.5)
WBC: 7.3 10*3/uL (ref 4.0–10.5)
nRBC: 0 % (ref 0.0–0.2)

## 2023-05-17 LAB — BASIC METABOLIC PANEL
Anion gap: 8 (ref 5–15)
BUN: 13 mg/dL (ref 8–23)
CO2: 28 mmol/L (ref 22–32)
Calcium: 9.7 mg/dL (ref 8.9–10.3)
Chloride: 103 mmol/L (ref 98–111)
Creatinine, Ser: 1.1 mg/dL (ref 0.61–1.24)
GFR, Estimated: >60 mL/min (ref >60)
Glucose, Bld: 104 mg/dL — ABNORMAL HIGH (ref 70–99)
Potassium: 4.6 mmol/L (ref 3.5–5.1)
Sodium: 139 mmol/L (ref 135–145)

## 2023-05-17 LAB — TROPONIN I (HIGH SENSITIVITY)
Troponin I (High Sensitivity): 8 ng/L (ref <18)
Troponin I (High Sensitivity): 11 ng/L (ref <18)

## 2023-05-17 MED ORDER — PREDNISONE 20 MG PO TABS: 40.0000 mg | ORAL_TABLET | Freq: Every day | ORAL | 0 refills | Status: AC

## 2023-05-17 MED ORDER — DOXYCYCLINE HYCLATE 100 MG PO CAPS: 100.0000 mg | ORAL_CAPSULE | Freq: Two times a day (BID) | ORAL | 0 refills | Status: AC

## 2023-05-17 MED ORDER — PREDNISONE 20 MG PO TABS
40.0000 mg | ORAL_TABLET | Freq: Once | ORAL | Status: AC
  Administered 2023-05-17: 40 mg via ORAL
  Filled 2023-05-17: qty 2

## 2023-05-17 MED ORDER — DOXYCYCLINE HYCLATE 100 MG PO TABS
100.0000 mg | ORAL_TABLET | Freq: Once | ORAL | Status: AC
  Administered 2023-05-17: 100 mg via ORAL
  Filled 2023-05-17: qty 1

## 2023-05-17 NOTE — ED Notes (Signed)
Patient transported to X-ray 

## 2023-05-17 NOTE — ED Triage Notes (Signed)
PT went to UC this morning for complaints of SOB. Pt states he was sent here for further evaluation for possible pneumonia. SOB has been going on for 3 days. Hx of COPD. 1 neb treatment at home today. Denies any fevers. Denies coughing

## 2023-05-17 NOTE — ED Notes (Signed)
Patient left prior to receiving discharge instructions. 

## 2023-05-17 NOTE — Discharge Instructions (Signed)
Is a pleasure caring for you today.  You were provided with a dose of antibiotics and steroids in the emergency room.  I have sent the rest of prescription to your pharmacy.  Seek emergency care if experiencing any new or worsening symptoms.

## 2023-05-17 NOTE — ED Provider Notes (Signed)
[place] EM[facility] AT [facility] Provider Note   CSN: [id number] Arrival date & time: 05/17/23  1124     History  Chief Complaint  Patient presents with   Shortness of Breath    Evan Brock is a 72 y.o. male with PMHx COPD, CAD, HTN, DM who presents to ED concerned for  SOB worse from baseline x3 days. Home nebulizing treatment helped today and symptoms have not returned. Patient stating that his baseline cough has not gotten worse. Patient endorses taking Albuterol 2-3 times a day and trellegy every other day.  Denies fever, chest pain, cough, weezing, nausea, vomiting, abdominal pain   Shortness of Breath      Home Medications Prior to Admission medications   Medication Sig Start Date End Date Taking? Authorizing Provider  doxycycline (VIBRAMYCIN) 100 MG capsule Take 1 capsule (100 mg total) by mouth 2 (two) times daily for 7 days. Oral: 100 mg twice daily for 5 days but may extend up to 14 days depending on severity and clinical response 05/17/23 05/24/23 Yes Sharyl Nimrod, Charlotte Sanes F, PA-C  predniSONE (DELTASONE) 20 MG tablet Take 2 tablets (40 mg total) by mouth daily for 4 days. 05/17/23 05/21/23 Yes Ravyn Nikkel, Charlotte Sanes F, PA-C  albuterol (PROVENTIL) (2.5 MG/3ML) 0.083% nebulizer solution Inhale 3 mLs (2.5 mg total) by nebulization every 4 (four) hours as needed for wheezing or shortness of breath. 09/11/22   Jerre Simon, MD  albuterol (VENTOLIN HFA) 108 (90 Base) MCG/ACT inhaler INHALE 1 PUFF FOUR TIMES DAILY, AS NEEDED Patient taking differently: 2 puffs every 4 (four) hours as needed for wheezing or shortness of breath. 10/26/21 03/30/23  Raspet, Noberto Retort, PA-C  aspirin 81 MG EC tablet Take 1 tablet (81 mg total) by mouth daily. Swallow whole. 02/17/22   Patwardhan, Anabel Bene, MD  Blood Pressure Monitor KIT 1 kit by Does not apply route 3 (three) times daily as needed. 11/05/19   Grayce Sessions, NP  gabapentin (NEURONTIN) 800 MG tablet Take 800 mg by mouth 3  (three) times daily. 02/01/22   [provider]  HYDROcodone-acetaminophen (NORCO) 10-325 MG tablet Take by mouth.    [provider]  losartan (COZAAR) 100 MG tablet Take 1 tablet (100 mg total) by mouth daily. 05/11/23   Tolia, Sunit, DO  metFORMIN (GLUCOPHAGE) 500 MG tablet Take 1 tablet (500 mg total) by mouth 2 (two) times daily with a meal. Resume on Sunday 02/19/2022 02/17/22   Patwardhan, Anabel Bene, MD  naloxone [facility]) nasal spray 4 mg/0.1 mL CALL 911. SPR CONTENTS OF ONE SPRAYER (0.1ML) INTO ONE NOSTRIL. REPEAT IN 2-3 MIN IF SYMPTOMS OF OPIOID EMERGENCY PERSIST, ALTERNATE NOSTRILS    [provider]  nitroGLYCERIN (NITROSTAT) 0.4 MG SL tablet PLACE 1 TABLET UNDER THE TONGUE EVERY 5 MINUTES AS NEEDED FOR CHEST PAIN 03/01/23   Tolia, Sunit, DO  rosuvastatin (CRESTOR) 40 MG tablet TAKE 1 TABLET BY MOUTH AT BEDTIME Patient taking differently: Take 40 mg by mouth at bedtime. 07/07/22   Tolia, Sunit, DO  tamsulosin (FLOMAX) 0.4 MG CAPS capsule Take 0.8 mg by mouth daily after supper. 11/09/21   [provider]  ticagrelor (BRILINTA) 90 MG TABS tablet Take 1 tablet (90 mg total) by mouth 2 (two) times daily. 03/16/23 03/15/24  Tolia, Sunit, DO  tiZANidine (ZANAFLEX) 4 MG tablet  02/15/23   [provider]  TRELEGY ELLIPTA 100-62.5-25 MCG/ACT AEPB Take 1 puff by mouth daily. 12/21/21   [provider]  vitamin B-12 (CYANOCOBALAMIN)  50 MCG tablet Take 50 mcg by mouth daily.    [provider]      Allergies    Patient has no known allergies.    Review of Systems   Review of Systems  Respiratory:  Positive for shortness of breath.     Physical Exam Updated Vital Signs BP (!) 145/100   Pulse (!) 58   Temp 98.5 F (36.9 C)   Resp 16   Ht 5\' 9"  (1.753 m)   Wt 93 kg   SpO2 97%   BMI 30.27 kg/m  Physical Exam Vitals and nursing note reviewed.  Constitutional:      General: He is not in acute distress. HENT:     Head: Normocephalic  and atraumatic.     Mouth/Throat:     Mouth: Mucous membranes are moist.     Pharynx: No oropharyngeal exudate or posterior oropharyngeal erythema.  Eyes:     General: No scleral icterus.       Right eye: No discharge.        Left eye: No discharge.     Conjunctiva/sclera: Conjunctivae normal.  Cardiovascular:     Rate and Rhythm: Normal rate.     Pulses: Normal pulses.     Heart sounds: No murmur heard. Pulmonary:     Effort: Pulmonary effort is normal. No tachypnea or respiratory distress.     Breath sounds: No rhonchi or rales.     Comments: Distant mild wheezing Abdominal:     Tenderness: There is no abdominal tenderness.  Musculoskeletal:     Right lower leg: No edema.     Left lower leg: No edema.  Skin:    General: Skin is warm and dry.     Findings: No rash.  Neurological:     General: No focal deficit present.     Mental Status: He is alert. Mental status is at baseline.  Psychiatric:        Mood and Affect: Mood normal.     ED Results / Procedures / Treatments   Labs (all labs ordered are listed, but only abnormal results are displayed) Labs Reviewed  BASIC METABOLIC PANEL - Abnormal; Notable for the following components:      Result Value   Glucose, Bld 104 (*)    All other components within normal limits  CBC  TROPONIN I (HIGH SENSITIVITY)  TROPONIN I (HIGH SENSITIVITY)    EKG EKG Interpretation Date/Time:  Thursday May 17 2023 11:10:40 EDT Ventricular Rate:  69 PR Interval:  178 QRS Duration:  80 QT Interval:  358 QTC Calculation: 383 R Axis:   72  Text Interpretation: Normal sinus rhythm Normal ECG Confirmed by Alvino Blood ([id number]) on 05/17/2023 4:32:38 PM  Radiology DG Chest 2 View  Result Date: 05/17/2023 CLINICAL DATA:  sob EXAM: CHEST - 2 VIEW COMPARISON:  04/23/2023. FINDINGS: Bilateral lung fields are clear. Bilateral costophrenic angles are clear. Stable cardio-mediastinal silhouette. No acute osseous abnormalities. The soft  tissues are within normal limits. IMPRESSION: No acute cardiopulmonary process. Electronically Signed   By: Jules Schick M.D.   On: 05/17/2023 13:00    Procedures Procedures    Medications Ordered in ED Medications  predniSONE (DELTASONE) tablet 40 mg (has no administration in time range)  doxycycline (VIBRA-TABS) tablet 100 mg (has no administration in time range)    ED Course/ Medical Decision Making/ A&P  Medical Decision Making Amount and/or Complexity of Data Reviewed Labs: ordered. Radiology: ordered.  Risk Prescription drug management.   This patient presents to the ED for concern of shortness of breath, this involves an extensive number of treatment options, and is a complaint that carries with it a high risk of complications and morbidity.  The differential diagnosis includes Anxiety, Arrhythmia, CHF, Asthma, COPD, PNA, COVID/Flu/RSV, STEMI, Tamponade, TPNX, Sepsis,    Co morbidities that complicate the patient evaluation  COPD, CAD, HTN, DM     Lab Tests:  I Ordered, and personally interpreted labs.  The pertinent results include:   - BMP: no concern for electrolyte abnormality; no concern for kidney damage - Trop: Initial and repeat within normal limits - CBC: No concern for anemia or leukocytosis    Imaging Studies ordered:  I ordered imaging studies including  -chest xray: To assess for process contributing to patient's symptoms I independently visualized and interpreted imaging  I agree with the radiologist interpretation   Cardiac Monitoring: / EKG:  The patient was maintained on a cardiac monitor.  I personally viewed and interpreted the cardiac monitored which showed an underlying rhythm of: Sinus rhythm without acute ST changes or arrhythmias   Problem List / ED Course / Critical interventions / Medication management  Patient with PMHx COPD presents to emergency room complaining of increased SOB over the past 3  days.  Patient denies fevers or increase in baseline cough.  Physical exam with mild distant wheezing.  Patient's oxygen saturations at 97% on RA during my interview.  Patient declining wanting a breathing treatment or further CT chest imaging at this time stating that he would rather go home.  Educated patient that some pneumonias can be missed on a chest x-ray and would be better evaluated with a CT of the chest.  Patient really endorsed understanding but declined other imaging.  Patient specifically requesting to be discharged on antibiotics and steroids.  Initial and repeat troponin within normal limits.  BMP without electrolyte abnormality.  CBC without leukocytosis or anemia.  Chest x-ray without acute cardiopulmonary disease.  EKG reassuring. Provided patient with a dose of prednisone and doxycycline here in the emergency room and discharging him with a course of ABX and steroids. I have reviewed the patients home medicines and have made adjustments as needed Patient was given return precautions. Patient stable for discharge at this time. Patient verbalized understanding of plan.  Staffed patient with Dr. Suezanne Jacquet  DDx: These are considered less likely due to history of present illness and physical exam findings Arrhythmia: EKG without concern STEMI: EKG without concern Tamponade: chest xray without concern CHF: no physical exam findings TPNX: Lungs clear to auscultation bilaterally Sepsis: afebrile and other vital signs stable Pulmonary embolism: Vital signs without tachycardia or hypoxia   Social Determinants of Health:  none           Final Clinical Impression(s) / ED Diagnoses Final diagnoses:  COPD exacerbation (HCC)    Rx / DC Orders ED Discharge Orders          Ordered    predniSONE (DELTASONE) 20 MG tablet  Daily        05/17/23 1703    doxycycline (VIBRAMYCIN) 100 MG capsule  2 times daily        05/17/23 1703              Dorthy Cooler,  [place] 05/17/23 1718    Lonell Grandchild, MD 05/17/23 2351

## 2023-05-23 ENCOUNTER — Ambulatory Visit (HOSPITAL_COMMUNITY)
Admission: RE | Admit: 2023-05-23 | Discharge: 2023-05-23 | Disposition: A | Discharge status: Home / Self Care | Payer: Medicare HMO | Source: Ambulatory Visit | Attending: Cardiology | Admitting: Cardiology

## 2023-05-23 DIAGNOSIS — I700 Atherosclerosis of aorta: Secondary | ICD-10-CM | POA: Insufficient documentation

## 2023-05-23 DIAGNOSIS — I2510 Atherosclerotic heart disease of native coronary artery without angina pectoris: Secondary | ICD-10-CM | POA: Diagnosis not present

## 2023-05-23 DIAGNOSIS — J479 Bronchiectasis, uncomplicated: Secondary | ICD-10-CM | POA: Insufficient documentation

## 2023-05-23 DIAGNOSIS — I77810 Thoracic aortic ectasia: Secondary | ICD-10-CM | POA: Diagnosis present

## 2023-05-28 ENCOUNTER — Encounter: Payer: Self-pay | Admitting: Cardiology

## 2023-05-28 ENCOUNTER — Ambulatory Visit: Payer: Medicare HMO | Admitting: Cardiology

## 2023-05-28 VITALS — BP 126/72 | HR 83 | Resp 16 | Ht 69.0 in | Wt 209.8 lb

## 2023-05-28 DIAGNOSIS — I77810 Thoracic aortic ectasia: Secondary | ICD-10-CM

## 2023-05-28 DIAGNOSIS — E782 Mixed hyperlipidemia: Secondary | ICD-10-CM

## 2023-05-28 DIAGNOSIS — Z955 Presence of coronary angioplasty implant and graft: Secondary | ICD-10-CM

## 2023-05-28 DIAGNOSIS — I10 Essential (primary) hypertension: Secondary | ICD-10-CM

## 2023-05-28 DIAGNOSIS — I2510 Atherosclerotic heart disease of native coronary artery without angina pectoris: Secondary | ICD-10-CM

## 2023-05-28 DIAGNOSIS — Z87891 Personal history of nicotine dependence: Secondary | ICD-10-CM

## 2023-05-28 DIAGNOSIS — E119 Type 2 diabetes mellitus without complications: Secondary | ICD-10-CM

## 2023-05-28 NOTE — Progress Notes (Signed)
ID:  Evan Brock, DOB 06-17-1951, MRN [id number]  PCP:  Marden Noble, PA-C  Cardiologist:  Tessa Lerner, DO, [facility] (established care February 16, 2022)  Date: 05/28/23 Last Office Visit: 03/30/2023  Chief Complaint  Patient presents with   Follow-up    Reviewed CT results-history of ascending aortic dilatation    HPI  Gerred Arntzen is a 72 y.o. 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  Patient presented to the hospital in April 2023 and ruled in as an NSTEMI he was noted to have obstructive disease on angiography and underwent aspiration thrombectomy status post PTCA and stenting to the LCx.  During the same hospitalization he was noted to have mildly dilated ascending aorta at 41 mm per echocardiography.  Patient was educated on importance of improving his modifiable cardiovascular risk factors.  At the last office visit in July 2024 he was advised to have a CT of the chest performed to reevaluate the dimensions of the ascending aorta.  Given the frequency of PACs he also had a Zio patch to evaluate for dysrhythmias.  Since last office visit, he did have a CT of the chest results are still pending.  On prior EKG he had PACs and clinically was having symptoms of feeling extra beats.  Zio patch was ordered but he would like to hold off on it for now.  For reasons unknown he is not on Lopressor 25 mg p.o. daily but currently based on the medication bottles he brings in he is on Toprol-XL 50 mg p.o. daily medications have been updated.  Since last office visit he requested preoperative risk stratification but states that he is holding off on the procedure for  now.  ALLERGIES: No Known Allergies  MEDICATION LIST PRIOR TO VISIT: Current Meds  Medication Sig   albuterol (PROVENTIL) (2.5 MG/3ML) 0.083% nebulizer solution Inhale 3 mLs (2.5 mg total) by nebulization every 4 (four) hours as needed for wheezing or shortness of breath.   albuterol (VENTOLIN HFA) 108 (90 Base) MCG/ACT inhaler INHALE 1 PUFF FOUR TIMES DAILY, AS NEEDED (Patient taking differently: 2 puffs every 4 (four) hours as needed for wheezing or shortness of breath.)   aspirin 81 MG EC tablet Take 1 tablet (81 mg total) by mouth daily. Swallow whole.   Blood Pressure Monitor KIT 1 kit by Does not apply route 3 (three) times daily as needed.   Calcium Carb-Cholecalciferol (CALCIUM 500 + D PO) Take by mouth.   Cholecalciferol (VITAMIN D3) 1000 units CAPS Take by mouth.   gabapentin (NEURONTIN) 800 MG tablet Take 800 mg by mouth 3 (three) times daily.   HYDROcodone-acetaminophen (NORCO) 10-325 MG tablet Take by mouth.   losartan (COZAAR) 100 MG tablet Take 1 tablet (100 mg total) by mouth daily.   metFORMIN (GLUCOPHAGE) 500 MG tablet Take 1 tablet (500 mg total) by mouth 2 (two) times daily with a meal. Resume on Sunday 02/19/2022   metoprolol succinate (TOPROL-XL) 50 MG 24 hr tablet Take 50 mg by mouth daily. Take with or immediately following a meal.   naloxone (NARCAN) nasal spray 4 mg/0.1 mL CALL 911. SPR CONTENTS OF ONE SPRAYER (0.1ML) INTO ONE NOSTRIL. REPEAT IN 2-3 MIN IF SYMPTOMS OF OPIOID EMERGENCY PERSIST, ALTERNATE NOSTRILS  nitroGLYCERIN (NITROSTAT) 0.4 MG SL tablet PLACE 1 TABLET UNDER THE TONGUE EVERY 5 MINUTES AS NEEDED FOR CHEST PAIN   rosuvastatin (CRESTOR) 40 MG tablet TAKE 1 TABLET BY MOUTH AT BEDTIME (Patient taking differently: Take 40 mg by mouth at bedtime.)   tamsulosin (FLOMAX) 0.4 MG CAPS capsule Take 0.8 mg by mouth daily after supper.   ticagrelor (BRILINTA) 90 MG TABS tablet Take 1 tablet (90 mg total) by mouth 2 (two) times daily.   tiZANidine (ZANAFLEX) 4 MG  tablet    TRELEGY ELLIPTA 100-62.5-25 MCG/ACT AEPB Take 1 puff by mouth daily.   vitamin B-12 (CYANOCOBALAMIN) 50 MCG tablet Take 50 mcg by mouth daily.   [DISCONTINUED] metoprolol tartrate (LOPRESSOR) 25 MG tablet Take 25 mg by mouth daily.     PAST MEDICAL HISTORY: Past Medical History:  Diagnosis Date   Anticoagulant long-term use    brilinta/ asa--- managed by cardiology   Benign localized prostatic hyperplasia with lower urinary tract symptoms (LUTS)    COPD (chronic obstructive pulmonary disease) [facility])    pulmonology-- dr byrum--- lov in epic 07-12-2020   Coronary artery disease 01/2022   cardiologist--- dr Valentino Saxon;  NSTEMI  s/p cath 02-16-2022  thrombectomy / PTCA w/ DES to LCX;   DDD (degenerative disc disease), cervical    DDD (degenerative disc disease), lumbosacral    ED (erectile dysfunction)    Exertional shortness of breath    History of acute respiratory failure    several times w/ hypoxia   History of asbestos exposure    Hyperlipidemia, mixed    Hypertension    Malignant neoplasm prostate [facility]) 12/2022   urologist-- dr pace/  radiation oncologist--- dr Kathrynn Running;   dx 03/ 2024, gleason 4+5   Moderate persistent asthma    OA (osteoarthritis)    Pneumonia 04/23/2023   [facility] w/ hypoxia O2 sat 82% on arrival post nebulizer O2 sat 89%   Type 2 diabetes mellitus (HCC)     PAST SURGICAL HISTORY: Past Surgical History:  Procedure Laterality Date   CORONARY STENT INTERVENTION N/A 02/16/2022   Procedure: CORONARY STENT INTERVENTION;  Surgeon: Elder Negus, MD;  Location: [facility] IN[facility]IVE [facility];  Service: Cardiovascular;  Laterality: N/A;   CORONARY THROMBECTOMY N/A 02/16/2022   Procedure: Coronary Thrombectomy;  Surgeon: Elder Negus, MD;  Location: [facility];  Service: Cardiovascular;  Laterality: N/A;   LEFT HEART CATH AND CORONARY ANGIOGRAPHY N/A 02/16/2022   Procedure: LEFT HEART CATH AND CORONARY ANGIOGRAPHY;  Surgeon: Elder Negus, MD;  Location: [facility];  Service: Cardiovascular;  Laterality: N/A;   TOTAL HIP ARTHROPLASTY Right 01/11/2009   @[facility]  by dr Lequita Halt   TOTAL HIP ARTHROPLASTY Left 08/19/2013   Procedure: TOTAL HIP ARTHROPLASTY ANTERIOR APPROACH;  Surgeon: Velna Ochs, MD;  Location: [facility] OR;  Service: Orthopedics;  Laterality: Left;    FAMILY HISTORY: The patient family history includes Diabetes in his mother; Hypertension in his father; Lupus in his sister.  SOCIAL HISTORY:  The patient  reports that he quit smoking about 32 years ago. His smoking use included cigarettes. He has never used smokeless tobacco. He reports current alcohol use of about 3.0 standard drinks of alcohol per week. He reports that he does not currently use drugs after having used the following drugs: Marijuana.  REVIEW OF SYSTEMS: Review of Systems  Cardiovascular:  Negative for chest pain, claudication, dyspnea on exertion, leg swelling, near-syncope, orthopnea, palpitations, paroxysmal nocturnal dyspnea and syncope.  Respiratory:  Negative for shortness of breath.   Hematologic/Lymphatic: Negative for bleeding problem.    PHYSICAL EXAM:    05/28/2023    1:47 PM 05/17/2023    7:00 PM 05/17/2023    5:23 PM  Vitals with BMI  Height 5\' 9"     Weight 209 lbs 13 oz    BMI 30.97    Systolic 126 157 829  Diastolic 72 83 72  Pulse 83 66 56   Physical Exam  Constitutional: No distress.  Age appropriate, hemodynamically stable.   Neck: No JVD present.  Cardiovascular: Normal rate, regular rhythm, S1 normal, S2 normal, intact distal pulses and normal pulses. Exam reveals no gallop, no S3 and no S4.  No murmur heard. Pulmonary/Chest: Effort normal and breath sounds normal. No stridor. He has no wheezes. He has no rales.  Abdominal: Soft. Bowel sounds are normal. He exhibits no distension. There is no abdominal tenderness.  Musculoskeletal:        General: No edema.     Cervical back: Neck supple.   Neurological: He is alert and oriented to person, place, and time. He has intact cranial nerves (2-12).  Skin: Skin is warm and moist.   CARDIAC DATABASE: EKG: 03/03/2022: Normal sinus rhythm, 80 bpm, without underlying injury pattern 09/29/2022: Sinus rhythm, 84 bpm, PACs, without underlying injury pattern. March 30, 2023: Sinus rhythm, 78 bpm, without underlying ischemia or injury pattern, frequent PACs.  Echocardiogram: 02/17/2022: LVEF 60 to 65%, mild LVH, normal diastolic function, moderate pulmonary hypertension, moderate MR eccentric jet to his anterior mitral valve leaflet, trileaflet aortic valve without stenosis or regurgitation, mild dilatation of the ascending aorta 41 mm, RAP 8 mmHg   Heart Catheterization: 02/16/2022 LM: Normal LAD: No significant disease RCA: Prox and distal 30% disease Lcx: Large vessel with prox occlusion with organized heavy thrombus (Culprit)   Successful percutaneous coronary intervention prox Lcx     Aspiration thrombectomy     PTCA and stent placement 4.0 X 16 mm Synergy drug-eluting stent     Post dilatation with 4.0X8 mm  balloon up to 22 atm   100%--->0% residual stenosis TIMI flow 0-->II   Distal embolization due to late presentation.  Recommend aggrastat and nitro drip overnight.   Difficult procedure due to difficulty wiring tortuous Lcx vessel with organized thrombus requiring multiple wires and use of angled support catheter  LABORATORY DATA:    Latest Ref Rng & Units 05/17/2023   11:39 AM 09/11/2022    3:29 AM 09/10/2022    3:33 AM  CBC  WBC 4.0 - 10.5 K/uL 7.3  11.6  11.9   Hemoglobin 13.0 - 17.0 g/dL 56.2  13.0  86.5   Hematocrit 39.0 - 52.0 % 43.7  38.9  38.8   Platelets 150 - 400 K/uL 221  321  290        Latest Ref Rng & Units 05/17/2023   11:39 AM 04/30/2023    8:44 AM 09/11/2022    3:29 AM  CMP  Glucose 70 - 99 mg/dL 784  696  295   BUN 8 - 23 mg/dL 13  17  26    Creatinine 0.61 - 1.24 mg/dL 2.84  1.32  4.40    Sodium 135 - 145 mmol/L 139  140  137   Potassium 3.5 - 5.1 mmol/L 4.6  5.0  3.9   Chloride 98 - 111 mmol/L 103  101  99   CO2 22 - 32 mmol/L 28  26  29  Calcium 8.9 - 10.3 mg/dL 9.7  9.9  9.9   Total Protein 6.0 - 8.5 g/dL  6.9    Total Bilirubin 0.0 - 1.2 mg/dL  0.4    Alkaline Phos 44 - 121 IU/L  66    AST 0 - 40 IU/L  18    ALT 0 - 44 IU/L  19      Lipid Panel  Lab Results  Component Value Date   CHOL 111 04/30/2023   HDL 46 04/30/2023   LDLCALC 53 04/30/2023   LDLDIRECT 50 04/30/2023   TRIG 49 04/30/2023   CHOLHDL 4.2 02/17/2022    No components found for: "NTPROBNP" No results for input(s): "PROBNP" in the last 8760 hours. No results for input(s): "TSH" in the last 8760 hours.   BMP Recent Labs    09/10/22 0333 09/11/22 0329 04/30/23 0844 05/17/23 1139  NA 137 137 140 139  K 4.1 3.9 5.0 4.6  CL 100 99 101 103  CO2 26 29 26 28   GLUCOSE 133* 115* 130* 104*  BUN 22 26* 17 13  CREATININE 1.43* 1.42* 1.27 1.10  CALCIUM 9.8 9.9 9.9 9.7  GFRNONAA 52* 53*  --  >60    HEMOGLOBIN A1C Lab Results  Component Value Date   HGBA1C 6.9 (H) 09/08/2022   MPG 151.33 09/08/2022    IMPRESSION:    ICD-10-CM   1. Atherosclerosis of native coronary artery of native heart without angina pectoris  I25.10     2. Status post coronary artery stent placement  Z95.5     3. Essential hypertension, benign  I10     4. Ascending aorta dilatation (HCC)  I77.810     5. Non-insulin dependent type 2 diabetes mellitus (HCC)  E11.9     6. Mixed hyperlipidemia  E78.2     7. Former smoker  Z87.891         RECOMMENDATIONS: Evan Brock is a 72 y.o. 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  Atherosclerosis of native coronary artery of native heart without angina pectoris s/p PCI Hx NSTEMI: Index event February 16, 2022. Underwent aspiration thrombectomy followed by PTCA and PCI to the culprit LCx. Currently on dual antiplatelet therapy. Not having discomfort similar to his coronary equivalents. Prior EKG is nonischemic. Reemphasized the importance of secondary prevention with focus on improving her modifiable cardiovascular risk factors such as glycemic control, lipid management, blood pressure control, weight loss.  Essential hypertension, benign Blood blood pressures are well-controlled. Continue current medical therapy. No changes warranted at this time.  Ascending aorta dilatation (HCC) 41 mm ascending aorta based on echocardiogram 02/18/2022. CT of the chest was performed since last office visit-unfortunately at the time of the office visit results are still pending. Reached out to radiology to have it read.  Will have to reach out to the patient once resulted  Premature atrial contractions: Given his nonspecific palpitations and PACs on formal EKG recommended Zio patch for further evaluation. Since last office visit patient states that he would like to hold off on a cardiac monitor at this time.  Non-insulin dependent type 2 diabetes mellitus (HCC) Educated him on the importance of glycemic control. Continue metformin, Jardiance, statin therapy  Mixed hyperlipidemia Tolerating statin therapy well without  any side effects or intolerance. Would recommend a goal LDL less than 55 mg/dL.  FINAL MEDICATION LIST END OF ENCOUNTER: No orders of the defined types were placed in this encounter.   Medications Discontinued During This Encounter  Medication Reason   metoprolol tartrate (LOPRESSOR) 25 MG tablet Patient Preference      Current Outpatient Medications:    albuterol (PROVENTIL) (2.5 MG/3ML) 0.083% nebulizer solution,  Inhale 3 mLs (2.5 mg total) by nebulization every 4 (four) hours as needed for wheezing or shortness of breath., Disp: 90 mL, Rfl: 0   albuterol (VENTOLIN HFA) 108 (90 Base) MCG/ACT inhaler, INHALE 1 PUFF FOUR TIMES DAILY, AS NEEDED (Patient taking differently: 2 puffs every 4 (four) hours as needed for wheezing or shortness of breath.), Disp: 8 g, Rfl: 0   aspirin 81 MG EC tablet, Take 1 tablet (81 mg total) by mouth daily. Swallow whole., Disp: 30 tablet, Rfl: 1   Blood Pressure Monitor KIT, 1 kit by Does not apply route 3 (three) times daily as needed., Disp: 1 kit, Rfl: 0   Calcium Carb-Cholecalciferol (CALCIUM 500 + D PO), Take by mouth., Disp: , Rfl:    Cholecalciferol (VITAMIN D3) 1000 units CAPS, Take by mouth., Disp: , Rfl:    gabapentin (NEURONTIN) 800 MG tablet, Take 800 mg by mouth 3 (three) times daily., Disp: , Rfl:    HYDROcodone-acetaminophen (NORCO) 10-325 MG tablet, Take by mouth., Disp: , Rfl:    losartan (COZAAR) 100 MG tablet, Take 1 tablet (100 mg total) by mouth daily., Disp: 90 tablet, Rfl: 3   metFORMIN (GLUCOPHAGE) 500 MG tablet, Take 1 tablet (500 mg total) by mouth 2 (two) times daily with a meal. Resume on Sunday 02/19/2022, Disp: 180 tablet, Rfl: 1   metoprolol succinate (TOPROL-XL) 50 MG 24 hr tablet, Take 50 mg by mouth daily. Take with or immediately following a meal., Disp: , Rfl:    naloxone (NARCAN) nasal spray 4 mg/0.1 mL, CALL 911. SPR CONTENTS OF ONE SPRAYER (0.1ML) INTO ONE NOSTRIL. REPEAT IN 2-3 MIN IF SYMPTOMS OF OPIOID EMERGENCY PERSIST, ALTERNATE NOSTRILS, Disp: , Rfl:    nitroGLYCERIN (NITROSTAT) 0.4 MG SL tablet, PLACE 1 TABLET UNDER THE TONGUE EVERY 5 MINUTES AS NEEDED FOR CHEST PAIN, Disp: 25 tablet, Rfl: 3   rosuvastatin (CRESTOR) 40 MG tablet, TAKE 1 TABLET BY MOUTH AT BEDTIME (Patient taking differently: Take 40 mg by mouth at bedtime.), Disp: 90 tablet, Rfl: 0   tamsulosin (FLOMAX) 0.4 MG CAPS capsule, Take 0.8 mg by mouth daily after supper., Disp: ,  Rfl:    ticagrelor (BRILINTA) 90 MG TABS tablet, Take 1 tablet (90 mg total) by mouth 2 (two) times daily., Disp: 180 tablet, Rfl: 3   tiZANidine (ZANAFLEX) 4 MG tablet, , Disp: , Rfl:    TRELEGY ELLIPTA 100-62.5-25 MCG/ACT AEPB, Take 1 puff by mouth daily., Disp: , Rfl:    vitamin B-12 (CYANOCOBALAMIN) 50 MCG tablet, Take 50 mcg by mouth daily., Disp: , Rfl:   No orders of the defined types were placed in this encounter.   There are no Patient Instructions on file for this visit.   --Continue cardiac medications as reconciled in final medication list. --Return in about 6 months (around 11/28/2023) for Follow up, CAD. Or sooner if needed. --Continue follow-up with your primary care physician regarding the management of your other chronic comorbid conditions.  Patient's questions and concerns were addressed to his satisfaction. He voices understanding of the instructions provided during this encounter.  This note was created using a voice recognition software as a result there may be grammatical errors inadvertently enclosed that do not reflect the nature of this encounter. Every attempt is made to correct such errors.  Tessa Lerner, [place], [facility]  Pager:  [phone] Office: [phone]

## 2023-05-29 ENCOUNTER — Ambulatory Visit (HOSPITAL_BASED_OUTPATIENT_CLINIC_OR_DEPARTMENT_OTHER): Admission: RE | Admit: 2023-05-29 | Payer: Medicare HMO | Source: Home / Self Care | Admitting: Urology

## 2023-05-29 ENCOUNTER — Telehealth: Payer: Self-pay | Admitting: *Deleted

## 2023-05-29 HISTORY — DX: Mixed hyperlipidemia: E78.2

## 2023-05-29 HISTORY — DX: Benign prostatic hyperplasia with lower urinary tract symptoms: N40.1

## 2023-05-29 HISTORY — DX: Moderate persistent asthma, uncomplicated: J45.40

## 2023-05-29 HISTORY — DX: Other intervertebral disc degeneration, lumbosacral region: M51.37

## 2023-05-29 HISTORY — DX: Long term (current) use of anticoagulants: Z79.01

## 2023-05-29 HISTORY — DX: Type 2 diabetes mellitus without complications: E11.9

## 2023-05-29 HISTORY — DX: Personal history of other diseases of the respiratory system: Z87.09

## 2023-05-29 HISTORY — DX: Contact with and (suspected) exposure to asbestos: Z77.090

## 2023-05-29 HISTORY — DX: Male erectile dysfunction, unspecified: N52.9

## 2023-05-29 HISTORY — DX: Other intervertebral disc degeneration, lumbosacral region without mention of lumbar back pain or lower extremity pain: M51.379

## 2023-05-29 HISTORY — DX: Other cervical disc degeneration, unspecified cervical region: M50.30

## 2023-05-29 HISTORY — DX: Unspecified osteoarthritis, unspecified site: M19.90

## 2023-05-29 SURGERY — GOLD SEED IMPLANT
Anesthesia: Monitor Anesthesia Care

## 2023-05-29 NOTE — Telephone Encounter (Signed)
Called patient to inquire about fid. markers and space oar being cancelled and he informed me that right now that he doesn't want to do anything, I informed him that his sim appt. has been cancelled, patient verified that is what he wants for now.

## 2023-05-31 ENCOUNTER — Ambulatory Visit: Payer: Medicare HMO | Admitting: Radiation Oncology

## 2023-05-31 NOTE — Progress Notes (Signed)
RN spoke with patient to follow up with having fiducial's and spaceOAR cancelled.  Patient verbalized he just needs additional time before starting radiation treatment.  No barriers identified, just prefers to wait prior to starting radiation.    RN provided education on ADT safely allowing for delay in radiation.  Pt is agreeable for me to follow up in a few weeks to assess readiness to start radiation.

## 2023-06-01 NOTE — Progress Notes (Signed)
Pt states he hasn't sqoked in 40 years and I also checked his chart and it says former since 43

## 2023-06-01 NOTE — Progress Notes (Signed)
Please place order for CT.

## 2023-06-06 ENCOUNTER — Encounter (HOSPITAL_COMMUNITY): Payer: Self-pay

## 2023-06-06 ENCOUNTER — Ambulatory Visit (HOSPITAL_COMMUNITY)
Admission: EM | Admit: 2023-06-06 | Discharge: 2023-06-06 | Disposition: A | Discharge status: Home / Self Care | Payer: Medicare HMO | Attending: Emergency Medicine | Admitting: Emergency Medicine

## 2023-06-06 ENCOUNTER — Ambulatory Visit (INDEPENDENT_AMBULATORY_CARE_PROVIDER_SITE_OTHER): Payer: Medicare HMO

## 2023-06-06 DIAGNOSIS — K449 Diaphragmatic hernia without obstruction or gangrene: Secondary | ICD-10-CM | POA: Diagnosis not present

## 2023-06-06 DIAGNOSIS — J189 Pneumonia, unspecified organism: Secondary | ICD-10-CM | POA: Diagnosis not present

## 2023-06-06 DIAGNOSIS — J441 Chronic obstructive pulmonary disease with (acute) exacerbation: Secondary | ICD-10-CM | POA: Diagnosis not present

## 2023-06-06 DIAGNOSIS — R0602 Shortness of breath: Secondary | ICD-10-CM | POA: Diagnosis present

## 2023-06-06 DIAGNOSIS — R062 Wheezing: Secondary | ICD-10-CM

## 2023-06-06 DIAGNOSIS — U071 COVID-19: Secondary | ICD-10-CM | POA: Diagnosis not present

## 2023-06-06 MED ORDER — IPRATROPIUM-ALBUTEROL 0.5-2.5 (3) MG/3ML IN SOLN
3.0000 mL | Freq: Once | RESPIRATORY_TRACT | Status: AC
  Administered 2023-06-06: 3 mL via RESPIRATORY_TRACT

## 2023-06-06 MED ORDER — LEVOFLOXACIN 500 MG PO TABS: 500.0000 mg | ORAL_TABLET | Freq: Every day | ORAL | 0 refills | Status: DC

## 2023-06-06 MED ORDER — IPRATROPIUM-ALBUTEROL 0.5-2.5 (3) MG/3ML IN SOLN: 3.0000 mL | Freq: Four times a day (QID) | RESPIRATORY_TRACT | 0 refills | Status: DC | PRN

## 2023-06-06 MED ORDER — IPRATROPIUM-ALBUTEROL 0.5-2.5 (3) MG/3ML IN SOLN
RESPIRATORY_TRACT | Status: AC
  Filled 2023-06-06: qty 3

## 2023-06-06 MED ORDER — PREDNISONE 20 MG PO TABS
ORAL_TABLET | ORAL | Status: AC
  Filled 2023-06-06: qty 2

## 2023-06-06 MED ORDER — PREDNISONE 20 MG PO TABS
40.0000 mg | ORAL_TABLET | Freq: Once | ORAL | Status: AC
  Administered 2023-06-06: 40 mg via ORAL

## 2023-06-06 MED ORDER — PREDNISONE 20 MG PO TABS: 40.0000 mg | ORAL_TABLET | Freq: Every day | ORAL | 0 refills | Status: AC

## 2023-06-06 NOTE — Discharge Instructions (Addendum)
We have tested you for COVID-19 and we will contact you if positive.  You chest x-ray was suspicious for pneumonia so I am treating you with antibiotics, take them with food. You can continue with the DuoNebs every 6 hours as needed for wheezing and shortness of breath.  We have given you a dose of steroids today in clinic, start them tomorrow with breakfast.  If your oxygen drops below 90% and does not improve with the breathing treatment, you have worsening of wheezing, chest pain or shortness of breath please call 911 or head to the nearest emergency department for further evaluation.

## 2023-06-06 NOTE — ED Triage Notes (Addendum)
Pt c/o cough/congestion and SOB since this am. States his wife is COVID positive. Wheezing noted and o2 sat drops with talking. States used his inhaler and neb tx this am with relief.

## 2023-06-06 NOTE — ED Provider Notes (Signed)
[facility]    CSN: [id number] Arrival date & time: 06/06/23  1328      History   Chief Complaint Chief Complaint  Patient presents with   Shortness of Breath   Cough    HPI Evan Brock is a 72 y.o. male.   Patient presents to clinic for shortness of breath and cough that started this morning.  His wife recently tested positive and is wondering if he also has Covid-19.  He is tachypnic and hypoxic on initial presentation. Used a breathing treatment this morning with some relief. Reports wheezing and shortness of breath.  He denies any chest pain.  He does have a history of COPD, acute respiratory failure with hypoxia asbestos exposure, prostate cancer, asthma and type 2 diabetes.  The history is provided by the patient and medical records.  Shortness of Breath Associated symptoms: cough and wheezing   Cough Associated symptoms: shortness of breath and wheezing     Past Medical History:  Diagnosis Date   Anticoagulant long-term use    brilinta/ asa--- managed by cardiology   Benign localized prostatic hyperplasia with lower urinary tract symptoms (LUTS)    COPD (chronic obstructive pulmonary disease) [facility])    pulmonology-- dr byrum--- lov in epic 07-12-2020   Coronary artery disease 01/2022   cardiologist--- dr Valentino Saxon;  NSTEMI  s/p cath 02-16-2022  thrombectomy / PTCA w/ DES to LCX;   DDD (degenerative disc disease), cervical    DDD (degenerative disc disease), lumbosacral    ED (erectile dysfunction)    Exertional shortness of breath    History of acute respiratory failure    several times w/ hypoxia   History of asbestos exposure    Hyperlipidemia, mixed    Hypertension    Malignant neoplasm prostate [facility]) 12/2022   urologist-- dr pace/  radiation oncologist--- dr Kathrynn Running;   dx 03/ 2024, gleason 4+5   Moderate persistent asthma    OA (osteoarthritis)    Pneumonia 04/23/2023   [facility] w/ hypoxia O2 sat 82% on arrival post nebulizer O2 sat  89%   Type 2 diabetes mellitus [facility])     Patient Active Problem List   Diagnosis Date Noted   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   Acute hypoxic respiratory failure (HCC) 09/08/2022   Status post coronary artery stent placement    S/P PTCA (percutaneous transluminal coronary angioplasty)    Non-insulin dependent type 2 diabetes mellitus (HCC)    Mixed hyperlipidemia    Coronary atherosclerosis due to calcified coronary lesion    Former smoker    Class 1 obesity due to excess calories with serious comorbidity and body mass index (BMI) of 31.0 to 31.9 in adult    Non-ST elevation (NSTEMI) myocardial infarction (HCC) 02/16/2022   NSTEMI (non-ST elevated myocardial infarction) (HCC) 02/16/2022   Acute respiratory failure with hypoxia (HCC) 11/12/2021   CAP (community acquired pneumonia) 02/16/2021   Hypoxia 02/16/2021   COPD with acute exacerbation (HCC) 02/16/2021   Diabetes mellitus type 2 in obese 02/16/2021   Acute kidney injury (HCC) 02/16/2021   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   Leukocytosis 10/04/2015   COPD (chronic obstructive pulmonary disease) (HCC) 10/01/2015   Essential hypertension, benign 03/01/2015   Asthma with acute exacerbation 01/26/2015   Thrombocytopenia, unspecified (HCC) 11/06/2013   Acute respiratory failure (HCC) with hypoxia 10/01/2013   Acute renal failure (HCC) 10/01/2013    Past Surgical History:  Procedure Laterality Date   CORONARY STENT INTERVENTION N/A  02/16/2022   Procedure: CORONARY STENT INTERVENTION;  Surgeon: Elder Negus, MD;  [facility];  Service: Cardiovascular;  Laterality: N/A;   CORONARY THROMBECTOMY N/A 02/16/2022   Procedure: Coronary Thrombectomy;  Surgeon: Elder Negus, MD;  [facility];  Service: Cardiovascular;  Laterality: N/A;   LEFT HEART CATH AND CORONARY ANGIOGRAPHY N/A 02/16/2022   Procedure: LEFT HEART CATH AND CORONARY ANGIOGRAPHY;  Surgeon:  Elder Negus, MD;  [facility];  Service: Cardiovascular;  Laterality: N/A;   TOTAL HIP ARTHROPLASTY Right 01/11/2009   @[facility]  by dr Lequita Halt   TOTAL HIP ARTHROPLASTY Left 08/19/2013   Procedure: TOTAL HIP ARTHROPLASTY ANTERIOR APPROACH;  Surgeon: Velna Ochs, MD;  Location: [facility];  Service: Orthopedics;  Laterality: Left;       Home Medications    Prior to Admission medications   Medication Sig Start Date End Date Taking? Authorizing Provider  ipratropium-albuterol (DUONEB) 0.5-2.5 (3) MG/3ML SOLN Take 3 mLs by nebulization every 6 (six) hours as needed. 06/06/23  Yes Rinaldo Ratel, [place] N, FNP  levofloxacin (LEVAQUIN) 500 MG tablet Take 1 tablet (500 mg total) by mouth daily. 06/06/23  Yes Rinaldo Ratel, [place] N, FNP  predniSONE (DELTASONE) 20 MG tablet Take 2 tablets (40 mg total) by mouth daily for 4 days. 06/06/23 06/10/23 Yes Rinaldo Ratel, [place] N, FNP  albuterol (PROVENTIL) (2.5 MG/3ML) 0.083% nebulizer solution Inhale 3 mLs (2.5 mg total) by nebulization every 4 (four) hours as needed for wheezing or shortness of breath. 09/11/22   Jerre Simon, MD  albuterol (VENTOLIN HFA) 108 (90 Base) MCG/ACT inhaler INHALE 1 PUFF FOUR TIMES DAILY, AS NEEDED Patient taking differently: 2 puffs every 4 (four) hours as needed for wheezing or shortness of breath. 10/26/21 05/28/23  Raspet, Noberto Retort, PA-C  aspirin 81 MG EC tablet Take 1 tablet (81 mg total) by mouth daily. Swallow whole. 02/17/22   Patwardhan, Anabel Bene, MD  Blood Pressure Monitor KIT 1 kit by Does not apply route 3 (three) times daily as needed. 11/05/19   Grayce Sessions, NP  Calcium Carb-Cholecalciferol (CALCIUM 500 + D PO) Take by mouth.    [provider]  Cholecalciferol (VITAMIN D3) 1000 units CAPS Take by mouth.    [provider]  gabapentin (NEURONTIN) 800 MG tablet Take 800 mg by mouth 3 (three) times daily. 02/01/22   [provider]  HYDROcodone-acetaminophen (NORCO) 10-325 MG tablet  Take by mouth.    [provider]  losartan (COZAAR) 100 MG tablet Take 1 tablet (100 mg total) by mouth daily. 05/11/23   Tolia, Sunit, DO  metFORMIN (GLUCOPHAGE) 500 MG tablet Take 1 tablet (500 mg total) by mouth 2 (two) times daily with a meal. Resume on Sunday 02/19/2022 02/17/22   Patwardhan, Anabel Bene, MD  metoprolol succinate (TOPROL-XL) 50 MG 24 hr tablet Take 50 mg by mouth daily. Take with or immediately following a meal.    [provider]  naloxone (NARCAN) nasal spray 4 mg/0.1 mL CALL 911. SPR CONTENTS OF ONE SPRAYER (0.1ML) INTO ONE NOSTRIL. REPEAT IN 2-3 MIN IF SYMPTOMS OF OPIOID EMERGENCY PERSIST, ALTERNATE NOSTRILS    [provider]  nitroGLYCERIN (NITROSTAT) 0.4 MG SL tablet PLACE 1 TABLET UNDER THE TONGUE EVERY 5 MINUTES AS NEEDED FOR CHEST PAIN 03/01/23   Tolia, Sunit, DO  rosuvastatin (CRESTOR) 40 MG tablet TAKE 1 TABLET BY MOUTH AT BEDTIME Patient taking differently: Take 40 mg by mouth at bedtime. 07/07/22   Odis Hollingshead,  Sunit, DO  tamsulosin (FLOMAX) 0.4 MG CAPS capsule Take 0.8 mg by mouth daily after supper. 11/09/21   [provider]  ticagrelor (BRILINTA) 90 MG TABS tablet Take 1 tablet (90 mg total) by mouth 2 (two) times daily. 03/16/23 03/15/24  Tolia, Sunit, DO  tiZANidine (ZANAFLEX) 4 MG tablet  02/15/23   [provider]  TRELEGY ELLIPTA 100-62.5-25 MCG/ACT AEPB Take 1 puff by mouth daily. 12/21/21   [provider]  vitamin B-12 (CYANOCOBALAMIN) 50 MCG tablet Take 50 mcg by mouth daily.    [provider]    Family History Family History  Problem Relation Age of Onset   Diabetes Mother    Hypertension Father    Lupus Sister     Social History Social History   Tobacco Use   Smoking status: Former    Current packs/day: 0.00    Types: Cigarettes    Quit date: 10/23/1990    Years since quitting: 32.6   Smokeless tobacco: Never   Tobacco comments:    10/01/2013 "quit smoking cigarettes~ 15-20 yr ago"  Vaping  Use   Vaping status: Never Used  Substance Use Topics   Alcohol use: Yes    Alcohol/week: 3.0 standard drinks of alcohol    Types: 3 Cans of beer per week    Comment: 6pack in 2 days   Drug use: Not Currently    Types: Marijuana    Comment: 10/01/2013 "last drug use was 10 yr ago"     Allergies   Patient has no known allergies.   Review of Systems Review of Systems  Respiratory:  Positive for cough, shortness of breath and wheezing.      Physical Exam Triage Vital Signs ED Triage Vitals [06/06/23 1453]  Encounter Vitals Group     BP (!) 177/95     Systolic BP Percentile      Diastolic BP Percentile      Pulse Rate 81     Resp (!) 24     Temp 98.6 F (37 C)     Temp Source Oral     SpO2 (!) 86 %     Weight      Height      Head Circumference      Peak Flow      Pain Score 0     Pain Loc      Pain Education      Exclude from Growth Chart    No data found.  Updated Vital Signs BP (!) 177/95 (BP Location: Left Arm)   Pulse 78   Temp 98.6 F (37 C) (Oral)   Resp 20   SpO2 98%   Visual Acuity Right Eye Distance:   Left Eye Distance:   Bilateral Distance:    Right Eye Near:   Left Eye Near:    Bilateral Near:     Physical Exam Vitals and nursing note reviewed.  Constitutional:      Appearance: Normal appearance. He is well-developed.  HENT:     Head: Normocephalic and atraumatic.     Right Ear: External ear normal.     Left Ear: External ear normal.     Nose: Nose normal.     Mouth/Throat:     Mouth: Mucous membranes are moist.  Eyes:     Conjunctiva/sclera: Conjunctivae normal.  Cardiovascular:     Rate and Rhythm: Normal rate and regular rhythm.     Heart sounds: Normal heart sounds. No murmur heard. Pulmonary:  Effort: Tachypnea and accessory muscle usage present.     Breath sounds: Examination of the right-upper field reveals wheezing. Examination of the left-upper field reveals wheezing. Decreased breath sounds and wheezing present.   Musculoskeletal:        General: Normal range of motion.     Cervical back: Normal range of motion.  Skin:    General: Skin is warm and dry.  Neurological:     General: No focal deficit present.     Mental Status: He is alert and oriented to person, place, and time.  Psychiatric:        Mood and Affect: Mood normal.        Behavior: Behavior normal.      UC Treatments / Results  Labs (all labs ordered are listed, but only abnormal results are displayed) Labs Reviewed  SARS CORONAVIRUS 2 (TAT 6-24 HRS)    EKG   Radiology DG Chest 2 View  Result Date: 06/06/2023 CLINICAL DATA:  Wheezing.  COPD. EXAM: CHEST - 2 VIEW COMPARISON:  05/17/2023 FINDINGS: Heart size and mediastinal contours are stable. Small hiatal hernia identified. The no pleural fluid or interstitial edema. Opacity within the medial right lung base is identified which may reflect infiltrate. Left lung appears clear. The visualized osseous structures appear intact. IMPRESSION: 1. Opacity within the medial right lung base may reflect infiltrate. Correlate for any clinical signs or symptoms of pneumonia. 2. Small hiatal hernia. Electronically Signed   By: Signa Kell M.D.   On: 06/06/2023 16:23    Procedures Procedures (including critical care time)  Medications Ordered in UC Medications  ipratropium-albuterol (DUONEB) 0.5-2.5 (3) MG/3ML nebulizer solution 3 mL (3 mLs Nebulization Given 06/06/23 1516)  ipratropium-albuterol (DUONEB) 0.5-2.5 (3) MG/3ML nebulizer solution 3 mL (3 mLs Nebulization Given 06/06/23 1622)  predniSONE (DELTASONE) tablet 40 mg (40 mg Oral Given 06/06/23 1622)    Initial Impression / Assessment and Plan / UC Course  I have reviewed the triage vital signs and the nursing notes.  Pertinent labs & imaging results that were available during my care of the patient were reviewed by me and considered in my medical decision making (see chart for details).  Vitals and triage reviewed, patient is  tachypneic and hypoxic initially.  Oxygen improved to 90% with resting.  Audible expiratory wheezing, lung sounds overall diminished. DuoNeb given in clinic, afterwards oxygenation improved to 94% on room air, still having expiratory wheezing.   COVID-19 testing obtained.  Chest x-ray does not show any acute abnormalities, awaiting official radiology over-read. Oxygenation down to 90%, Duoneb repeated. Oxygenation at 98% with mild expiratory wheezing and better air movement.    Does not want anti-viral if Covid-19 positive. Strict emergency precautions given, patient verbalized understanding.     Final Clinical Impressions(s) / UC Diagnoses   Final diagnoses:  COPD exacerbation (HCC)  Pneumonia of right middle lobe due to infectious organism     Discharge Instructions      We have tested you for COVID-19 and we will contact you if positive.  You chest x-ray was suspicious for pneumonia so I am treating you with antibiotics, take them with food. You can continue with the DuoNebs every 6 hours as needed for wheezing and shortness of breath.  We have given you a dose of steroids today in clinic, start them tomorrow with breakfast.  If your oxygen drops below 90% and does not improve with the breathing treatment, you have worsening of wheezing, chest pain or shortness of breath  please call 911 or head to the nearest emergency department for further evaluation.      ED Prescriptions     Medication Sig Dispense Auth. Provider   ipratropium-albuterol (DUONEB) 0.5-2.5 (3) MG/3ML SOLN Take 3 mLs by nebulization every 6 (six) hours as needed. 360 mL Rinaldo Ratel, [place] N, [place]   predniSONE (DELTASONE) 20 MG tablet Take 2 tablets (40 mg total) by mouth daily for 4 days. 8 tablet Rinaldo Ratel, [place] N, [place]   levofloxacin (LEVAQUIN) 500 MG tablet Take 1 tablet (500 mg total) by mouth daily. 5 tablet , [place] N, [place]      PDMP not reviewed this encounter.   , [place] N,  [place] 06/06/23 [phone]

## 2023-06-07 LAB — SARS CORONAVIRUS 2 (TAT 6-24 HRS): SARS Coronavirus 2: POSITIVE — AB

## 2023-06-14 ENCOUNTER — Ambulatory Visit: Payer: Medicare HMO | Admitting: Radiation Oncology

## 2023-06-17 ENCOUNTER — Other Ambulatory Visit: Payer: Self-pay | Admitting: Cardiology

## 2023-06-17 DIAGNOSIS — I77810 Thoracic aortic ectasia: Secondary | ICD-10-CM

## 2023-06-19 NOTE — Progress Notes (Signed)
Called and spoke to patient he voiced understanding and results were forward to PCP

## 2023-06-20 NOTE — Progress Notes (Signed)
RN placed call to patient to assess readiness to start radiation treatment.   No answer, no option for voicemail.  RN will continue to reach out to patient.

## 2023-07-03 NOTE — Progress Notes (Signed)
RN spoke with patient to assess readiness for starting radiation.  Patient requesting additional time prior to committing to proceeding with radiation.  RN reviewed possible barriers, patient just personally needs some additional time.  Pt agreeable for me to follow up in the next 2-3 weeks.

## 2023-07-04 ENCOUNTER — Other Ambulatory Visit: Payer: Self-pay | Admitting: Cardiology

## 2023-07-04 DIAGNOSIS — J449 Chronic obstructive pulmonary disease, unspecified: Secondary | ICD-10-CM

## 2023-07-04 DIAGNOSIS — Z87891 Personal history of nicotine dependence: Secondary | ICD-10-CM

## 2023-07-04 DIAGNOSIS — I491 Atrial premature depolarization: Secondary | ICD-10-CM

## 2023-07-04 DIAGNOSIS — E119 Type 2 diabetes mellitus without complications: Secondary | ICD-10-CM

## 2023-07-04 DIAGNOSIS — I77810 Thoracic aortic ectasia: Secondary | ICD-10-CM

## 2023-07-04 DIAGNOSIS — I2510 Atherosclerotic heart disease of native coronary artery without angina pectoris: Secondary | ICD-10-CM

## 2023-07-04 DIAGNOSIS — I10 Essential (primary) hypertension: Secondary | ICD-10-CM

## 2023-07-04 DIAGNOSIS — I214 Non-ST elevation (NSTEMI) myocardial infarction: Secondary | ICD-10-CM

## 2023-07-04 DIAGNOSIS — E782 Mixed hyperlipidemia: Secondary | ICD-10-CM

## 2023-07-04 DIAGNOSIS — Z955 Presence of coronary angioplasty implant and graft: Secondary | ICD-10-CM

## 2023-07-10 ENCOUNTER — Other Ambulatory Visit: Payer: Self-pay | Admitting: Cardiology

## 2023-07-12 NOTE — Progress Notes (Signed)
done

## 2023-07-24 NOTE — Progress Notes (Signed)
Patient was original consult on 5/14 for his stage T1c adenocarcinoma of the prostate with Gleason Score of 4+5, and PSA of 5.1.  Patient agreed to proceed with LT-ADT and daily radiation.  Received Eligard 45mg  on 6/14.   Was originally scheduled for fiducial's and spaceOAR but cancelled due to wanting to wait for radiation. Patient is now agreeable to proceed with radiation.  RN provided education, and will mail education resources as well.  MD's notified.  Plan of care in progress.

## 2023-07-27 ENCOUNTER — Other Ambulatory Visit: Payer: Self-pay | Admitting: Urology

## 2023-07-30 ENCOUNTER — Telehealth: Payer: Self-pay | Admitting: *Deleted

## 2023-07-30 NOTE — Telephone Encounter (Signed)
Pre-operative Risk Assessment    Patient Name: Evan Brock  DOB: [birth year] MRN: [id number]    DATE OF LAST VISIT: 05/28/23 DR. TOLIA  DATE OF NEXT VISIT: 11/29/23 DR. TOLIA   Request for Surgical Clearance    Procedure:   SPACE OAR & FIDUCIAL MARKERS  Date of Surgery:  Clearance 08/14/23                                 Surgeon:  DR. MARYELLEN PACE  Surgeon's Group or Practice Name: [facility]   Phone number:  [phone] Fax number:  [phone]   Type of Clearance Requested:   - Medical ; ASA x 5 DAYS PRIOR    Type of Anesthesia:  Not Indicated   Additional requests/questions:    Elpidio Anis   07/30/2023, 12:07 PM

## 2023-07-30 NOTE — Telephone Encounter (Signed)
Dr. Odis Hollingshead , patient's chart was reviewed for preoperative cardiac evaluation.  He/she was seen by you on 05/28/2023. He is on Brilinta  s/p  PTCA and stent placement to L Cx on 02/17/2023,. He is pending Barista. Request only asks about ASA, bur he is on DAPT.Marland KitchenAccording to protocol, we request that you comment on cardiac risk for upcoming procedure since office visit was less than 2 months ago.   Please route your response to p cv div preop.  Thank you, Joni Reining DNP, [facility]

## 2023-07-30 NOTE — Telephone Encounter (Signed)
Dr. Servando Salina , patient's chart was reviewed for preoperative cardiac evaluation.  He/she was seen by you on 05/28/2023 and according to protocol, we request that you comment on cardiac risk for Space OAR and Fiducial Markers.scheduled for 08/14/2023, since office visit was less than 2 months ago.    Please route your response to p cv div preop.  Thank you, Joni Reining DNP, [facility]

## 2023-07-31 NOTE — Telephone Encounter (Signed)
Acceptable risk.   Cath and coronary intervention was in 2023.   Hold both Aspirin and Brilinta for 7 days before the procedure.  Restart aspirin 81 mg p.o. daily once cleared by his surgeon/proceduralist postprocedure.  Evan Theiss [place], DO, [facility]

## 2023-07-31 NOTE — Telephone Encounter (Signed)
Sorry did not attach the pre op pool.   ST

## 2023-07-31 NOTE — Telephone Encounter (Signed)
Patient Name: Evan Brock  DOB: 1951-05-29 MRN: [id number]  Primary Cardiologist: None  Chart reviewed as part of pre-operative protocol coverage. Given past medical history and time since last visit, based on ACC/AHA guidelines, Nocholas Ouellette is at acceptable risk for the planned procedure without further cardiovascular testing.   Per Dr.Tolia: Acceptable risk.    Cath and coronary intervention was in 2023.    Hold both Aspirin and Brilinta for 7 days before the procedure.  Restart aspirin 81 mg p.o. daily once cleared by his surgeon/proceduralist postprocedure.   Sunit Tolia, DO, [facility]  The patient was advised that if he develops new symptoms prior to surgery to contact our office to arrange for a follow-up visit, and he verbalized understanding.  I will route this recommendation to the requesting party via Epic fax function and remove from pre-op pool.  Please call with questions.  Joni Reining, NP 07/31/2023, 8:33 AM

## 2023-08-06 NOTE — Progress Notes (Signed)
Called and left message w/ Shanda Bumps, [place] scheduler for Dr Arita Miss.  Inquiring about pt's medical , PCP clearance for surgery scheduled for 08-14-2023.  Due to pt being rescheduled previously for dx pneumonia w/ hypoxia at urgent care 04-23-2023 (refer to progress note from Harless Litten RN dated 05-14-2023) per anesthesia Dr Aleene Davidson pt will need to have medical pcp clearance prior to surgery when he gets reschedule and be two weeks asymptomatic.  Also, since that note , patient went to urgent again on 06-06-2023 dx COPD exacerbation/ pneumonia RML/ +COVID/ hypoxic.  Asked Shanda Bumps to fax medical clearance if she has received it.

## 2023-08-07 ENCOUNTER — Ambulatory Visit: Payer: Medicare HMO | Admitting: Radiation Oncology

## 2023-08-08 ENCOUNTER — Encounter (HOSPITAL_BASED_OUTPATIENT_CLINIC_OR_DEPARTMENT_OTHER): Payer: Self-pay | Admitting: Urology

## 2023-08-08 NOTE — Progress Notes (Signed)
Spoke w/ via phone for pre-op interview--- pt and pt's wife Lab needs dos----    [employer] results------ current EKG in epic/ chart COVID test -----patient states asymptomatic no test needed Arrive at ------- 0900 on 08-14-2023 NPO after MN NO Solid Food.  Clear liquids from MN until--- 0800 Med rec completed Medications to take morning of surgery ----- gabapentin, toprol, albuteral nebulizer Diabetic medication -----  do not take metformin morning of surgery Patient instructed no nail polish to be worn day of surgery Patient instructed to bring photo id and insurance card day of surgery Patient aware to have Driver (ride ) / caregiver    for 24 hours after surgery - wife, sheilann Patient Special Instructions ----- will do one fleet enema night before surgery. Asked to bring rescue inhaler and trelegy inhaler dos Pre-Op special Instructions -----  pt has telephone cardiac clearance by Joni Reining NP on 07-31-2023 in epic/ chart. Pending pcp medical clearance due to previously needing to be rescheduled from pneumonia/ hypoxia and per anesthesia , Dr Aleene Davidson MDA pt would need pcp medical clearance prior to surgery when gets rescheduled. (Refer to progress note on 05-14-2023 by Kary Kos) Pt stated has pcp appointment tomorrow , 08-09-2023.  Had spoken to [place], [place] scheduler for Dr Arita Miss , today she was calling pt's pcp office to follow up, so she is aware.  Patient verbalized understanding of instructions that were given at this phone interview. Patient denies chest pain, sob, fever, cough at the interview.    Anesthesia Review:  HTN;  CAD, NSTEMI s/p PTCA x1 DES LCx 02-16-2022; DOE w/  COPD/ moderate persistant asthma  last URI/ pneumonia/ hypoxia/ +COVID 06-06-2023;  DM 2;   Pt denies cardiac s&s.  Pt stated DOE w/ stairs , yard work, but able to do household chores.  Pt is not on oxygen.  Pt takes trelegy inhaler daily prn not daily as prescribed and uses albuterol nebulizer  daily in am as prevention and more if symptomatic,  last used rescue inhaler/ trelegy 08/03/2023. Stated last taken a nitroglycerin 06/ 2024 stated he told cardiologist about it , thought more reflux than cardiac related.     PCP:  Dr Randalyn Rhea @ [facility] (pt stated has appointment 08-09-2023) Cardiologist :  Dr Odis Hollingshead [facility] 03-30-2023) Chest x-ray : 06-06-2023 ;  CT 05-23-2023 EKG : 03-30-2023 Echo : 02-18-2022 Stress test: none Cardiac Cath :  02-16-2022 Activity level:  see above Sleep Study/ CPAP :no Fasting Blood Sugar :      / Checks Blood Sugar -- times a day:  once monthly Blood Thinner/ Instructions Maurice Small Dose: Brilinta ASA / Instructions/ Last Dose : ASA 81mg  Per pt was given instructions by office and cardiology stop 7 days prior to surgery, stated last dose for both, 08-07-2023

## 2023-08-13 NOTE — Progress Notes (Addendum)
Please refer to progress notes dated 10/14 & 08/08/23 by Harless Litten, RN.  Patient is scheduled for surgery tomorrow at [facility]. Per anesthesia, Dr. Aleene Davidson, patient will need to have medical clearance from his PCP prior to surgery.  I called [facility] and spoke with Shanda Bumps at the front desk and let her know that we had not received medical clearance. Shanda Bumps spoke with the clinical team lead. Per Shanda Bumps, the clinical team lead stated that they had received the request for medical clearance. However, Dr. Randalyn Rhea was not comfortable filling it out due to the patient not showing up for his primary care visit. She said in light of his hx of elevated WBCs and not so good O2 Saturations, she would need to see him in person before giving clearance. Dr. Allena Katz is out of the office and will not be back until Wednesday, 08/15/23. I called and left a message with [facility] letting them know the above informations. I am waiting for a call back from [facility] or Pam @ [facility].  I spoke with Hoy Morn, surgical supervisor at [facility] and let her know.  I reviewed this case with Dr. Arby Barrette, MDA on 08/13/23. Per Dr. Arby Barrette, it is okay to let patient come in on day of surgery and be evaluated by anesthesia. If his lungs are not clear or he has a low 02 saturation, his surgery should be cancelled.

## 2023-08-14 ENCOUNTER — Ambulatory Visit (HOSPITAL_BASED_OUTPATIENT_CLINIC_OR_DEPARTMENT_OTHER): Payer: Medicare HMO | Admitting: Anesthesiology

## 2023-08-14 ENCOUNTER — Encounter (HOSPITAL_BASED_OUTPATIENT_CLINIC_OR_DEPARTMENT_OTHER): Payer: Self-pay | Admitting: Urology

## 2023-08-14 ENCOUNTER — Telehealth: Payer: Self-pay | Admitting: *Deleted

## 2023-08-14 ENCOUNTER — Encounter (HOSPITAL_BASED_OUTPATIENT_CLINIC_OR_DEPARTMENT_OTHER)
Admission: RE | Disposition: A | Discharge status: Home / Self Care | Payer: Self-pay | Source: Home / Self Care | Attending: Urology

## 2023-08-14 ENCOUNTER — Ambulatory Visit (HOSPITAL_BASED_OUTPATIENT_CLINIC_OR_DEPARTMENT_OTHER)
Admission: RE | Admit: 2023-08-14 | Discharge: 2023-08-14 | Disposition: A | Discharge status: Home / Self Care | Payer: Medicare HMO | Attending: Urology | Admitting: Urology

## 2023-08-14 DIAGNOSIS — Z7984 Long term (current) use of oral hypoglycemic drugs: Secondary | ICD-10-CM | POA: Insufficient documentation

## 2023-08-14 DIAGNOSIS — Z955 Presence of coronary angioplasty implant and graft: Secondary | ICD-10-CM | POA: Insufficient documentation

## 2023-08-14 DIAGNOSIS — I10 Essential (primary) hypertension: Secondary | ICD-10-CM | POA: Insufficient documentation

## 2023-08-14 DIAGNOSIS — Z87891 Personal history of nicotine dependence: Secondary | ICD-10-CM | POA: Insufficient documentation

## 2023-08-14 DIAGNOSIS — C61 Malignant neoplasm of prostate: Secondary | ICD-10-CM | POA: Diagnosis present

## 2023-08-14 DIAGNOSIS — E119 Type 2 diabetes mellitus without complications: Secondary | ICD-10-CM | POA: Insufficient documentation

## 2023-08-14 DIAGNOSIS — R350 Frequency of micturition: Secondary | ICD-10-CM | POA: Insufficient documentation

## 2023-08-14 DIAGNOSIS — N401 Enlarged prostate with lower urinary tract symptoms: Secondary | ICD-10-CM | POA: Insufficient documentation

## 2023-08-14 DIAGNOSIS — E669 Obesity, unspecified: Secondary | ICD-10-CM | POA: Insufficient documentation

## 2023-08-14 DIAGNOSIS — I252 Old myocardial infarction: Secondary | ICD-10-CM | POA: Insufficient documentation

## 2023-08-14 DIAGNOSIS — J4489 Other specified chronic obstructive pulmonary disease: Secondary | ICD-10-CM | POA: Insufficient documentation

## 2023-08-14 DIAGNOSIS — I2510 Atherosclerotic heart disease of native coronary artery without angina pectoris: Secondary | ICD-10-CM | POA: Diagnosis not present

## 2023-08-14 DIAGNOSIS — I272 Pulmonary hypertension, unspecified: Secondary | ICD-10-CM | POA: Insufficient documentation

## 2023-08-14 DIAGNOSIS — R3912 Poor urinary stream: Secondary | ICD-10-CM | POA: Insufficient documentation

## 2023-08-14 DIAGNOSIS — Z79899 Other long term (current) drug therapy: Secondary | ICD-10-CM | POA: Insufficient documentation

## 2023-08-14 DIAGNOSIS — Z01818 Encounter for other preprocedural examination: Secondary | ICD-10-CM

## 2023-08-14 HISTORY — PX: GOLD SEED IMPLANT: SHX6343

## 2023-08-14 HISTORY — PX: SPACE OAR INSTILLATION: SHX6769

## 2023-08-14 HISTORY — DX: Other constipation: K59.09

## 2023-08-14 HISTORY — DX: Presence of dental prosthetic device (complete) (partial): Z97.2

## 2023-08-14 HISTORY — DX: Chronic pain syndrome: G89.4

## 2023-08-14 LAB — GLUCOSE, CAPILLARY: Glucose-Capillary: 122 mg/dL — ABNORMAL HIGH (ref 70–99)

## 2023-08-14 LAB — POCT I-STAT, CHEM 8
BUN: 20 mg/dL (ref 8–23)
Calcium, Ion: 1.29 mmol/L (ref 1.15–1.40)
Chloride: 102 mmol/L (ref 98–111)
Creatinine, Ser: 1 mg/dL (ref 0.61–1.24)
Glucose, Bld: 99 mg/dL (ref 70–99)
HCT: 44 % (ref 39.0–52.0)
Hemoglobin: 15 g/dL (ref 13.0–17.0)
Potassium: 5.7 mmol/L — ABNORMAL HIGH (ref 3.5–5.1)
Sodium: 141 mmol/L (ref 135–145)
TCO2: 34 mmol/L — ABNORMAL HIGH (ref 22–32)

## 2023-08-14 SURGERY — INSERTION, GOLD SEEDS
Anesthesia: Monitor Anesthesia Care

## 2023-08-14 MED ORDER — BUPIVACAINE HCL 0.25 % IJ SOLN
INTRAMUSCULAR | Status: DC | PRN
  Administered 2023-08-14: 10 mL

## 2023-08-14 MED ORDER — CEFAZOLIN SODIUM-DEXTROSE 2-4 GM/100ML-% IV SOLN
2.0000 g | INTRAVENOUS | Status: AC
  Administered 2023-08-14: 2 g via INTRAVENOUS

## 2023-08-14 MED ORDER — LACTATED RINGERS IV SOLN: INTRAVENOUS | Status: DC

## 2023-08-14 MED ORDER — ACETAMINOPHEN 500 MG PO TABS
1000.0000 mg | ORAL_TABLET | Freq: Once | ORAL | Status: AC
  Administered 2023-08-14: 1000 mg via ORAL

## 2023-08-14 MED ORDER — FENTANYL CITRATE (PF) 250 MCG/5ML IJ SOLN
INTRAMUSCULAR | Status: DC | PRN
  Administered 2023-08-14: 50 ug via INTRAVENOUS

## 2023-08-14 MED ORDER — ONDANSETRON HCL 4 MG/2ML IJ SOLN
INTRAMUSCULAR | Status: DC | PRN
  Administered 2023-08-14: 4 mg via INTRAVENOUS

## 2023-08-14 MED ORDER — OXYCODONE HCL 5 MG/5ML PO SOLN: 5.0000 mg | Freq: Once | ORAL | Status: DC | PRN

## 2023-08-14 MED ORDER — OXYCODONE HCL 5 MG PO TABS: 5.0000 mg | ORAL_TABLET | Freq: Once | ORAL | Status: DC | PRN

## 2023-08-14 MED ORDER — SODIUM CHLORIDE (PF) 0.9 % IJ SOLN
INTRAMUSCULAR | Status: DC | PRN
  Administered 2023-08-14: 10 mL

## 2023-08-14 MED ORDER — FENTANYL CITRATE (PF) 100 MCG/2ML IJ SOLN: 25.0000 ug | INTRAMUSCULAR | Status: DC | PRN

## 2023-08-14 MED ORDER — PROPOFOL 500 MG/50ML IV EMUL
INTRAVENOUS | Status: DC | PRN
  Administered 2023-08-14: 180 ug/kg/min via INTRAVENOUS

## 2023-08-14 MED ORDER — CEFAZOLIN SODIUM-DEXTROSE 2-4 GM/100ML-% IV SOLN
INTRAVENOUS | Status: AC
  Filled 2023-08-14: qty 100

## 2023-08-14 MED ORDER — ONDANSETRON HCL 4 MG/2ML IJ SOLN
INTRAMUSCULAR | Status: AC
  Filled 2023-08-14: qty 8

## 2023-08-14 MED ORDER — FLEET ENEMA RE ENEM: 1.0000 | ENEMA | Freq: Once | RECTAL | Status: DC

## 2023-08-14 MED ORDER — LACTATED RINGERS IV SOLN: INTRAVENOUS | Status: DC | PRN

## 2023-08-14 MED ORDER — FENTANYL CITRATE (PF) 100 MCG/2ML IJ SOLN
INTRAMUSCULAR | Status: AC
  Filled 2023-08-14: qty 2

## 2023-08-14 MED ORDER — PROPOFOL 10 MG/ML IV BOLUS
INTRAVENOUS | Status: DC | PRN
  Administered 2023-08-14 (×2): 20 mg via INTRAVENOUS

## 2023-08-14 MED ORDER — ACETAMINOPHEN 500 MG PO TABS
ORAL_TABLET | ORAL | Status: AC
  Filled 2023-08-14: qty 2

## 2023-08-14 MED ORDER — LIDOCAINE 2% (20 MG/ML) 5 ML SYRINGE
INTRAMUSCULAR | Status: DC | PRN
  Administered 2023-08-14: 40 mg via INTRAVENOUS

## 2023-08-14 MED ORDER — ONDANSETRON HCL 4 MG/2ML IJ SOLN: 4.0000 mg | Freq: Once | INTRAMUSCULAR | Status: DC | PRN

## 2023-08-14 SURGICAL SUPPLY — 24 items
BLADE CLIPPER SENSICLIP SURGIC (BLADE) ×1 IMPLANT
CNTNR URN SCR LID CUP LEK RST (MISCELLANEOUS) ×1 IMPLANT
CONT SPEC 4OZ STRL OR WHT (MISCELLANEOUS) ×1
COVER BACK TABLE 60X90IN (DRAPES) ×1 IMPLANT
DRSG TEGADERM 4X4.75 (GAUZE/BANDAGES/DRESSINGS) ×1 IMPLANT
DRSG TEGADERM 8X12 (GAUZE/BANDAGES/DRESSINGS) ×1 IMPLANT
GAUZE SPONGE 4X4 12PLY STRL (GAUZE/BANDAGES/DRESSINGS) ×1 IMPLANT
GLOVE BIO SURGEON STRL SZ 6.5 (GLOVE) ×1 IMPLANT
IMPL SPACEOAR VUE SYSTEM (Spacer) ×1 IMPLANT
IMPLANT SPACEOAR VUE SYSTEM (Spacer) ×1 IMPLANT
KIT TURNOVER CYSTO (KITS) ×1 IMPLANT
MARKER GOLD PRELOAD 1.2X3 (Urological Implant) ×1 IMPLANT
MARKER SKIN DUAL TIP RULER LAB (MISCELLANEOUS) ×1 IMPLANT
NDL SPNL 22GX3.5 QUINCKE BK (NEEDLE) ×1 IMPLANT
NEEDLE SPNL 22GX3.5 QUINCKE BK (NEEDLE) ×1
SEED GOLD PRELOAD 1.2X3 (Urological Implant) ×1 IMPLANT
SHEATH ULTRASOUND LF (SHEATH) IMPLANT
SHEATH ULTRASOUND LTX NONSTRL (SHEATH) IMPLANT
SLEEVE SCD COMPRESS KNEE MED (STOCKING) ×1 IMPLANT
SURGILUBE 2OZ TUBE FLIPTOP (MISCELLANEOUS) ×1 IMPLANT
SYR 10ML LL (SYRINGE) IMPLANT
SYR CONTROL 10ML LL (SYRINGE) ×1 IMPLANT
TOWEL OR 17X24 6PK STRL BLUE (TOWEL DISPOSABLE) ×1 IMPLANT
UNDERPAD 30X36 HEAVY ABSORB (UNDERPADS AND DIAPERS) ×1 IMPLANT

## 2023-08-14 NOTE — Anesthesia Preprocedure Evaluation (Addendum)
Anesthesia Evaluation  Patient identified by MRN, date of birth, ID band Patient awake    Reviewed: Allergy & Precautions, NPO status , Patient's Chart, lab work & pertinent test results, reviewed documented beta blocker date and time   History of Anesthesia Complications Negative for: history of anesthetic complications  Airway Mallampati: IV  TM Distance: >3 FB Neck ROM: Full    Dental  (+) Partial Upper, Partial Lower   Pulmonary asthma , COPD,  COPD inhaler, former smoker   breath sounds clear to auscultation + decreased breath sounds      Cardiovascular hypertension, Pt. on home beta blockers and Pt. on medications pulmonary hypertension+ CAD, + Past MI and + Cardiac Stents  Normal cardiovascular exam+ Valvular Problems/Murmurs MR    '23 TTE - EF 60 to 65%. There is mild left ventricular hypertrophy. There is moderately elevated pulmonary artery systolic pressure. Moderate mitral regurgitation, eccentric jet toward anterior mitral valve leaflet mitral valve regurgitation. There is mild dilatation of the ascending aorta, measuring 41 mm.      Neuro/Psych negative neurological ROS  negative psych ROS   GI/Hepatic negative GI ROS, Neg liver ROS,,,  Endo/Other  diabetes, Type 2, Oral Hypoglycemic Agents   Obesity   Renal/GU negative Renal ROS    Prostate cancer     Musculoskeletal  (+) Arthritis , Osteoarthritis,    Abdominal   Peds  Hematology  On brilinta    Anesthesia Other Findings   Reproductive/Obstetrics                             Anesthesia Physical Anesthesia Plan  ASA: 3  Anesthesia Plan: MAC   Post-op Pain Management: Tylenol PO (pre-op)* and Minimal or no pain anticipated   Induction:   PONV Risk Score and Plan: 1 and Propofol infusion and Treatment may vary due to age or medical condition  Airway Management Planned: Natural Airway and Simple Face  Mask  Additional Equipment: None  Intra-op Plan:   Post-operative Plan:   Informed Consent: I have reviewed the patients History and Physical, chart, labs and discussed the procedure including the risks, benefits and alternatives for the proposed anesthesia with the patient or authorized representative who has indicated his/her understanding and acceptance.       Plan Discussed with: CRNA and Anesthesiologist  Anesthesia Plan Comments: (Patient with some increased dyspnea on exertion for x1 week. SaO2 on RA 93%. Denies fever, cough, chest pain, SOB at rest, orthopnea, pedal edema. Exam unimpressive. Given patient has needed to hold his anticoagulant for procedure, having cancer-related procedure, and can be done under MAC, feel it is best to proceed with case today as opposed to delaying for further optimization. Discussed at length with patient who is understanding and accepting of the risks today.)        Anesthesia Quick Evaluation

## 2023-08-14 NOTE — Discharge Instructions (Addendum)
Fiducial Markers and SpaceOAR  For several days the patient:  should increase his fluid intake and limit strenuous activity. he might have mild discomfort at the base of his penis or in his rectum. he might have blood in his urine or blood in his bowel movements.  For 2-3 months he might have blood in his ejaculate (semen).  Call the office immedicately:  for blood clots in the urine or bowel movements,  difficulty urinating,  inability to urinate,  urinary retention,  painful or frequent urination,  fever, chills,  nausea, vomiting, other illness.     Post Anesthesia Home Care Instructions  Activity: Get plenty of rest for the remainder of the day. A responsible individual must stay with you for 24 hours following the procedure.  For the next 24 hours, DO NOT: -Drive a car -Advertising copywriter -Drink alcoholic beverages -Take any medication unless instructed by your physician -Make any legal decisions or sign important papers.  Meals: Start with liquid foods such as gelatin or soup. Progress to regular foods as tolerated. Avoid greasy, spicy, heavy foods. If nausea and/or vomiting occur, drink only clear liquids until the nausea and/or vomiting subsides. Call your physician if vomiting continues.  Special Instructions/Symptoms: Your throat may feel dry or sore from the anesthesia or the breathing tube placed in your throat during surgery. If this causes discomfort, gargle with warm salt water. The discomfort should disappear within 24 hours.  No acetaminophen/Tylenol until after 3:30 pm today if needed. Do not combine with Hydrocodone-acetaminophen.

## 2023-08-14 NOTE — H&P (Signed)
CC/HPI: cc: prostate cancer    TRUS bx 01/03/23  Gleason 4+5=9 in 1/12 cores  Gleason 3+3=6 in 6/12 cores  Gleason 3+4=7 in 2/12 cores  Gleason 4+3=7 in 1/12 cores   PSA 5.3  TRUS 33g  PSMA PET: localized   04/06/23: 72 year old man with high risk prostate cancer here to discuss ADT and fiducial markers/SpaceOAR. He has met with both a Loss adjuster, chartered as well as [name] and radiation oncology and has decided to move forward with radiation. PSMA PET scan shows localized prostate cancer.     ALLERGIES: Albuterol Sulfa Sulfate Inhale Solution    MEDICATIONS: Plavix 75 mg tablet  Tamsulosin Hcl 0.4 mg capsule TAKE 2 CAPSULES BY MOUTH EVERY NIGHT AT BEDTIME  Chlorhexidine Diacetate  Gabapentin  Hydrocodone-Acetaminophen  Losartan Potassium  Tizanidine Hcl     GU PSH: Prostate Needle Biopsy - 01/03/2023     NON-GU PSH: Hip Replacement Surgical Pathology, Gross And Microscopic Examination For Prostate Needle - 01/03/2023 Visit Complexity (formerly [facility]1X) - 02/27/2023     GU PMH: Prostate Cancer - 02/27/2023, - 01/12/2023 Urinary Frequency - 02/27/2023, - 01/12/2023, - 11/10/2022, - 07/12/2022, - 06/07/2022, - 03/08/2022, - 11/24/2021 BPH w/LUTS - 01/12/2023, - 11/10/2022, - 07/12/2022, - 06/07/2022, - 03/08/2022, - 11/24/2021 ED due to arterial insufficiency - 01/12/2023, - 11/24/2021 Elevated PSA - 01/03/2023, - 11/10/2022, - 07/12/2022, - 06/07/2022, - 03/08/2022, - 11/24/2021 Weak Urinary Stream - 11/10/2022, - 07/12/2022, - 06/07/2022, - 11/24/2021    NON-GU PMH: Arthritis Asthma COPD Diabetes Type 2 Hypertension    FAMILY HISTORY: 1 son - Son   SOCIAL HISTORY: Marital Status: Single Preferred Language: English; Ethnicity: Not Hispanic Or Latino; Race: White Current Smoking Status: Patient does not smoke anymore.   Tobacco Use Assessment Completed: Used Tobacco in last 30 days? Light Drinker.  Drinks 1 caffeinated drink per day.    REVIEW OF SYSTEMS:    GU Review Male:   Patient denies  frequent urination, hard to postpone urination, burning/ pain with urination, get up at night to urinate, leakage of urine, stream starts and stops, trouble starting your stream, have to strain to urinate , erection problems, and penile pain.  Gastrointestinal (Upper):   Patient denies nausea, vomiting, and indigestion/ heartburn.  Gastrointestinal (Lower):   Patient denies diarrhea and constipation.  Constitutional:   Patient denies fever, night sweats, weight loss, and fatigue.  Skin:   Patient denies skin rash/ lesion and itching.  Eyes:   Patient denies double vision and blurred vision.  Ears/ Nose/ Throat:   Patient denies sore throat and sinus problems.  Hematologic/Lymphatic:   Patient denies swollen glands and easy bruising.  Cardiovascular:   Patient denies leg swelling and chest pains.  Respiratory:   Patient denies cough and shortness of breath.  Endocrine:   Patient denies excessive thirst.  Musculoskeletal:   Patient denies back pain and joint pain.  Neurological:   Patient denies headaches and dizziness.  Psychologic:   Patient denies depression and anxiety.   VITAL SIGNS: None   MULTI-SYSTEM PHYSICAL EXAMINATION:    Constitutional: Well-nourished. No physical deformities. Normally developed. Good grooming.  Neck: Neck symmetrical, not swollen. Normal tracheal position.  Respiratory: No labored breathing, no use of accessory muscles.   Skin: No paleness, no jaundice, no cyanosis. No lesion, no ulcer, no rash.  Neurologic / Psychiatric: Oriented to time, oriented to place, oriented to person. No depression, no anxiety, no agitation.  Eyes: Normal conjunctivae. Normal eyelids.  Ears, Nose, Mouth, and Throat:  Left ear no scars, no lesions, no masses. Right ear no scars, no lesions, no masses. Nose no scars, no lesions, no masses. Normal hearing. Normal lips.  Musculoskeletal: Normal gait and station of head and neck.     Complexity of Data:  Records Review:   Previous Patient  Records, POC Tool  Urine Test Review:   Urinalysis   11/03/22 05/31/22 11/24/21  PSA  Total PSA 5.06 ng/mL 4.23 ng/mL 5.31 ng/mL  Free PSA 0.40 ng/mL 0.36 ng/mL 0.37 ng/mL  % Free PSA 8 % PSA 9 % PSA 7 % PSA    PROCEDURES:          Urinalysis Dipstick Dipstick Cont'd  Color: Yellow Bilirubin: Neg mg/dL  Appearance: Clear Ketones: Neg mg/dL  Specific Gravity: 1.610 Blood: Neg ery/uL  pH: 5.5 Protein: Neg mg/dL  Glucose: Neg mg/dL Urobilinogen: 0.2 mg/dL    Nitrites: Neg    Leukocyte Esterase: Neg leu/uL         Eligard 45mg / 6 Month - [id number], [id number] The injection site was sterilely prepped with alcohol. Eligard was injected subcutaneously () using standard technique. The patient tolerated the procedure well. A band aid was applied. The site was dry when the patient left the exam room. The patient will return as scheduled.  After treatment was administered, patient was observed without any adverse reactions noted.   Zero wasted.   Qty: 45 Adm. By: Juel Burrow  Unit: mg Lot No [id number]  Route: SQ Exp. Date 04/22/2024  Freq: None Mfgr.:   Site: Right Buttock   ASSESSMENT:      ICD-10 Details  1 GU:   Prostate Cancer - C61 Chronic, Stable  2   BPH w/LUTS - N40.1 Chronic, Stable  3   Urinary Frequency - R35.0 Chronic, Stable  4   Weak Urinary Stream - R39.12 Chronic, Stable   PLAN:           Orders Labs PSA          Document Letter(s):  Created for Patient: Clinical Summary         Notes:   High risk prostate cancer:  -Patient is decided to move forward with EBRT  -We discussed initiating androgen deprivation therapy today. Risks and benefits of ADT were discussed with the patient including but not limited to hot flashes, weight gain, change in muscle mass, change in bone density, decreased libido, possible cardiovascular risks.  -Patient received 42-month Eligard injection today. He already takes vitamin D and will add calcium to this.  -Will schedule fiducial  markers and SpaceOAR with the patient. Discussed the risks and benefits of SpaceOAR with him and he is like to proceed.

## 2023-08-14 NOTE — Telephone Encounter (Signed)
CALLED PATIENT TO REMIND OF SIM APPT. ON 08-17-23- ARRIVAL TIME - 9:45 AM @ [facility], INFORMED PATIENT TO ARRIVE WITH A FULL BLADDER,LVM FOR A RETURN CALL

## 2023-08-14 NOTE — Op Note (Signed)
Preoperative diagnosis: Clinically localized adenocarcinoma of the prostate  Postoperative diagnosis: Clinically localized adenocarcinoma of the prostate  Procedure: 1) Placement of fiducial markers into prostate                    2) Insertion of SpaceOAR hydrogel   Surgeon: Kasandra Knudsen, MD   Anesthesia: MAC  EBL: Minimal  Complications: None  Indication: Evan Brock is a 72 y.o. gentleman with clinically localized prostate cancer. After discussing management options for treatment, he elected to proceed with radiotherapy. He presents today for the above procedures. The potential risks, complications, alternative options, and expected recovery course have been discussed in detail with the patient and he has provided informed consent to proceed.  Description of procedure: The patient was administered preoperative antibiotics, placed in the dorsal lithotomy position, and prepped and draped in the usual sterile fashion. Next, transrectal ultrasonography was utilized to visualize the prostate. Three gold fiducial markers were then placed into the prostate via transperineal needles under ultrasound guidance at the right apex, right base, and left mid gland under direct ultrasound guidance. A site in the midline was then selected on the perineum for placement of an 18 g needle with saline. The needle was advanced above the rectum and below Denonvillier's fascia to the mid gland and confirmed to be in the midline on transverse imaging. One cc of saline was injected confirming appropriate expansion of this space. A total of 10 cc of saline was then injected to open the space further bilaterally. The saline syringe was then removed and the SpaceOAR hydrogel was injected with good distribution bilaterally. He tolerated the procedure well and without complications. He was given a voiding trial prior to discharge from the PACU.

## 2023-08-14 NOTE — Anesthesia Postprocedure Evaluation (Signed)
Anesthesia Post Note  Patient: Evan Brock  Procedure(s) Performed: GOLD SEED IMPLANT SPACE OAR INSTILLATION     Patient location during evaluation: PACU Anesthesia Type: MAC Level of consciousness: awake and alert Pain management: pain level controlled Vital Signs Assessment: post-procedure vital signs reviewed and stable Respiratory status: spontaneous breathing, nonlabored ventilation and respiratory function stable Cardiovascular status: stable and blood pressure returned to baseline Anesthetic complications: no   No notable events documented.  Last Vitals:  Vitals:   08/14/23 1145 08/14/23 1316  BP: 120/74 (!) 153/97  Pulse: (!) 58 72  Resp: 18 19  Temp:  36.6 C  SpO2: 95% 94%    Last Pain:  Vitals:   08/14/23 1307  TempSrc:   PainSc: 0-No pain                 Beryle Lathe

## 2023-08-14 NOTE — Interval H&P Note (Signed)
History and Physical Interval Note: UA from 08/06/23 reviewed.  08/14/2023 9:42 AM  Evan Brock  has presented today for surgery, with the diagnosis of PROSTATE CANCER.  The various methods of treatment have been discussed with the patient and family. After consideration of risks, benefits and other options for treatment, the patient has consented to  Procedure(s) with comments: GOLD SEED IMPLANT (N/A) - 30 MINUTES SPACE OAR INSTILLATION (N/A) as a surgical intervention.  The patient's history has been reviewed, patient examined, no change in status, stable for surgery.  I have reviewed the patient's chart and labs.  Questions were answered to the patient's satisfaction.     Evan Brock D Evan Brock

## 2023-08-14 NOTE — Transfer of Care (Signed)
Immediate Anesthesia Transfer of Care Note  Patient: Jamel Winker  Procedure(s) Performed: GOLD SEED IMPLANT SPACE OAR INSTILLATION  Patient Location: PACU  Anesthesia Type:MAC  Level of Consciousness: drowsy and patient cooperative  Airway & Oxygen Therapy: Patient Spontanous Breathing and Patient connected to face mask oxygen  Post-op Assessment: Report given to RN and Post -op Vital signs reviewed and stable  Post vital signs: Reviewed and stable  Last Vitals:  Vitals Value Taken Time  BP 90/55 08/14/23 1116  Temp    Pulse 70 08/14/23 1117  Resp 18 08/14/23 1117  SpO2 100 % 08/14/23 1117  Vitals shown include unfiled device data.  Last Pain:  Vitals:   08/14/23 0931  TempSrc: Oral  PainSc: 0-No pain      Patients Stated Pain Goal: 6 (08/14/23 0931)  Complications: No notable events documented.

## 2023-08-16 ENCOUNTER — Encounter (HOSPITAL_BASED_OUTPATIENT_CLINIC_OR_DEPARTMENT_OTHER): Payer: Self-pay | Admitting: Urology

## 2023-08-17 ENCOUNTER — Ambulatory Visit
Admission: RE | Admit: 2023-08-17 | Discharge: 2023-08-17 | Disposition: A | Discharge status: Home / Self Care | Payer: Medicare HMO | Source: Ambulatory Visit | Attending: Radiation Oncology | Admitting: Radiation Oncology

## 2023-08-17 DIAGNOSIS — Z510 Encounter for antineoplastic radiation therapy: Secondary | ICD-10-CM | POA: Insufficient documentation

## 2023-08-17 DIAGNOSIS — C61 Malignant neoplasm of prostate: Secondary | ICD-10-CM | POA: Insufficient documentation

## 2023-08-17 NOTE — Progress Notes (Signed)
Radiation Oncology         [phone] ________________________________  Name: Evan Brock MRN: [id number]  Date: 08/17/2023  DOB: [birth year]  SIMULATION AND TREATMENT PLANNING NOTE    ICD-10-CM   1. Malignant neoplasm of prostate (HCC)  C61       DIAGNOSIS:   72 y.o. gentleman with Stage T1c adenocarcinoma of the prostate with Gleason Score of 4+5, and PSA of 5.1.   NARRATIVE:  The patient was brought to the CT Simulation planning suite.  Identity was confirmed.  All relevant records and images related to the planned course of therapy were reviewed.  The patient freely provided informed written consent to proceed with treatment after reviewing the details related to the planned course of therapy. The consent form was witnessed and verified by the simulation staff.  Then, the patient was set-up in a stable reproducible supine position for radiation therapy.  A vacuum lock pillow device was custom fabricated to position his legs in a reproducible immobilized position.  Then, I performed a urethrogram under sterile conditions to identify the prostatic apex.  CT images were obtained.  Surface markings were placed.  The CT images were loaded into the planning software.  Then the prostate target and avoidance structures including the rectum, bladder, bowel and hips were contoured.  Treatment planning then occurred.  The radiation prescription was entered and confirmed.  A total of one complex treatment devices was fabricated. I have requested : Intensity Modulated Radiotherapy (IMRT) is medically necessary for this case for the following reason:  Rectal sparing.Marland Kitchen  PLAN:   The prostate, seminal vesicles, and pelvic lymph nodes will initially be treated to 45 Gy in 25 fractions of 1.8 Gy followed by a boost to the prostate only, to 75 Gy with 15 additional fractions of 2.0 Gy; concurrent with ADT (6 month Eligard 04/06/23).   ________________________________  Artist Pais Kathrynn Running, M.D.

## 2023-08-20 DIAGNOSIS — Z510 Encounter for antineoplastic radiation therapy: Secondary | ICD-10-CM | POA: Diagnosis not present

## 2023-08-24 ENCOUNTER — Encounter (HOSPITAL_COMMUNITY): Payer: Self-pay

## 2023-08-24 ENCOUNTER — Ambulatory Visit (HOSPITAL_COMMUNITY)
Admission: EM | Admit: 2023-08-24 | Discharge: 2023-08-24 | Disposition: A | Discharge status: Home / Self Care | Payer: Medicare HMO | Attending: Physician Assistant | Admitting: Physician Assistant

## 2023-08-24 DIAGNOSIS — J441 Chronic obstructive pulmonary disease with (acute) exacerbation: Secondary | ICD-10-CM | POA: Diagnosis not present

## 2023-08-24 MED ORDER — IPRATROPIUM-ALBUTEROL 0.5-2.5 (3) MG/3ML IN SOLN
RESPIRATORY_TRACT | Status: AC
Start: 2023-08-24 — End: ?
  Filled 2023-08-24: qty 3

## 2023-08-24 MED ORDER — PREDNISONE 50 MG PO TABS: ORAL_TABLET | ORAL | 0 refills | Status: DC

## 2023-08-24 MED ORDER — METHYLPREDNISOLONE SODIUM SUCC 125 MG IJ SOLR
INTRAMUSCULAR | Status: AC
Start: 2023-08-24 — End: ?
  Filled 2023-08-24: qty 2

## 2023-08-24 MED ORDER — IPRATROPIUM-ALBUTEROL 0.5-2.5 (3) MG/3ML IN SOLN
3.0000 mL | Freq: Once | RESPIRATORY_TRACT | Status: AC
  Administered 2023-08-24: 3 mL via RESPIRATORY_TRACT

## 2023-08-24 MED ORDER — METHYLPREDNISOLONE SODIUM SUCC 125 MG IJ SOLR
125.0000 mg | Freq: Once | INTRAMUSCULAR | Status: AC
  Administered 2023-08-24: 125 mg via INTRAMUSCULAR

## 2023-08-24 NOTE — ED Provider Notes (Signed)
[facility]    CSN: [id number] Arrival date & time: 08/24/23  1358      History   Chief Complaint Chief Complaint  Patient presents with   Shortness of Breath    HPI Evan Brock is a 72 y.o. male.   Patient complains of shortness of breath.  Patient reports he has a history of COPD.  Patient feels like he is having exacerbation of his COPD.  Patient reports he has not had a fever or chills.  Patient states that he has a slight cough he has had some congestion.  Patient reports he has not been exposed to anyone with flu or COVID.  Patient has had pneumonia in the past and does not feel like he has pneumonia.  Patient states that he normally gets better with an injection of steroids and oral steroids.  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  Reports he is on Singulair albuterol nebulizer and an albuterol inhaler at home.  Patient denies any chest pain.  The history is provided by the patient. No language interpreter was used.  Shortness of Breath   Past Medical History:  Diagnosis Date   Anticoagulant long-term use    brilinta/ asa--- managed by cardiology   Benign localized prostatic hyperplasia with lower urinary tract symptoms (LUTS)    Chronic constipation    OTC laxative prn   Chronic pain syndrome    pain management--- dr foster [facility])   neck and back   COPD (chronic obstructive pulmonary disease) (HCC)    followed by pcp ---  not on oxygen (previous seen by pulmonology-- dr byrum--- lov in epic 07-12-2020);   last exacerbation [facility]23-2023 [facility]  pneumonia/ hypoxia w/ O2 82% refused ED,  then ED visit 05-17-2023 hypoxia / treated w/ nebs, antibiotics;  back at urgent care 06-06-2023  exacerbation / pneumonia/ +COVID,  O2 90% treated w/ nebs/ prednisone/ antibiotics   Coronary artery disease 01/2022   cardiologist--- dr Valentino Saxon;  NSTEMI  s/p cath 02-16-2022  thrombectomy / PTCA  w/ DES to LCX;   DDD (degenerative disc disease), cervical    DDD (degenerative disc disease), lumbosacral    ED (erectile dysfunction)    Exertional shortness of breath    08-08-2023   unable to do stairs or yard word, but can do household chores ok   History of acute respiratory failure    several times w/ hypoxia   History of asbestos exposure    History of non-ST elevation myocardial infarction (NSTEMI) 02/16/2022   Hyperlipidemia, mixed    Hypertension    Malignant neoplasm prostate [facility]) 12/2022   urologist-- dr pace/  radiation oncologist--- dr Kathrynn Running;   dx 03/ 2024, gleason 4+5   Moderate persistent asthma    OA (osteoarthritis)    S/P drug eluting coronary stent placement 02/17/2023   x1 to LCx   Type 2 diabetes mellitus (HCC)    followed by pcp   (08-08-2023  per pt checks blood sugar one a month)   Wears partial dentures    upper only    Patient Active Problem List   Diagnosis Date Noted   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   Acute hypoxic respiratory failure (HCC) 09/08/2022   Status post coronary artery stent placement    S/P PTCA (percutaneous transluminal coronary angioplasty)    Non-insulin dependent type 2 diabetes mellitus (HCC)    Mixed  hyperlipidemia    Coronary atherosclerosis due to calcified coronary lesion    Former smoker    Class 1 obesity due to excess calories with serious comorbidity and body mass index (BMI) of 31.0 to 31.9 in adult    Non-ST elevation (NSTEMI) myocardial infarction (HCC) 02/16/2022   NSTEMI (non-ST elevated myocardial infarction) (HCC) 02/16/2022   Acute respiratory failure with hypoxia (HCC) 11/12/2021   CAP (community acquired pneumonia) 02/16/2021   Hypoxia 02/16/2021   COPD with acute exacerbation (HCC) 02/16/2021   Type 2 diabetes mellitus with obesity (HCC) 02/16/2021   Acute kidney injury (HCC) 02/16/2021   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   Leukocytosis 10/04/2015   COPD (chronic obstructive  pulmonary disease) (HCC) 10/01/2015   Essential hypertension, benign 03/01/2015   Asthma with acute exacerbation 01/26/2015   Thrombocytopenia, unspecified (HCC) 11/06/2013   Acute respiratory failure (HCC) with hypoxia 10/01/2013   Acute renal failure (HCC) 10/01/2013    Past Surgical History:  Procedure Laterality Date   CORONARY STENT INTERVENTION N/A 02/16/2022   Procedure: CORONARY STENT INTERVENTION;  Surgeon: Elder Negus, MD;  [facility];  Service: Cardiovascular;  Laterality: N/A;   CORONARY THROMBECTOMY N/A 02/16/2022   Procedure: Coronary Thrombectomy;  Surgeon: Elder Negus, MD;  [facility];  Service: Cardiovascular;  Laterality: N/A;   GOLD SEED IMPLANT N/A 08/14/2023   Procedure: GOLD SEED IMPLANT;  Surgeon: [name], MD;  Location: [facility];  Service: Urology;  Laterality: N/A;  30 MINUTES   LEFT HEART CATH AND CORONARY ANGIOGRAPHY N/A 02/16/2022   Procedure: LEFT HEART CATH AND CORONARY ANGIOGRAPHY;  Surgeon: Elder Negus, MD;  [facility];  Service: Cardiovascular;  Laterality: N/A;   SPACE OAR INSTILLATION N/A 08/14/2023   Procedure: SPACE OAR INSTILLATION;  Surgeon: [name], MD;  Location: [facility];  Service: Urology;  Laterality: N/A;   TOTAL HIP ARTHROPLASTY Right 01/11/2009   @[facility]  by dr Lequita Halt   TOTAL HIP ARTHROPLASTY Left 08/19/2013   Procedure: TOTAL HIP ARTHROPLASTY ANTERIOR APPROACH;  Surgeon: Velna Ochs, MD;  Location: [facility];  Service: Orthopedics;  Laterality: Left;       Home Medications    Prior to Admission medications   Medication Sig Start Date End Date Taking? Authorizing Provider  albuterol (PROVENTIL) (2.5 MG/3ML) 0.083% nebulizer solution Inhale 3 mLs (2.5 mg total) by nebulization every 4 (four) hours as needed for wheezing or shortness of breath. Patient taking differently: Take 2.5 mg by nebulization every 4  (four) hours as needed for wheezing or shortness of breath. Per pt usually does daily in am as prevention 09/11/22   Jerre Simon, MD  albuterol (VENTOLIN HFA) 108 (90 Base) MCG/ACT inhaler INHALE 1 PUFF FOUR TIMES DAILY, AS NEEDED Patient taking differently: Inhale 2 puffs into the lungs every 4 (four) hours as needed for wheezing or shortness of breath. 10/26/21 08/14/23  Raspet, Noberto Retort, PA-C  Ascorbic Acid (VITAMIN C) 1000 MG tablet Take 2 tablets by mouth daily.    [provider]  aspirin 81 MG EC tablet Take 1 tablet (81 mg total) by mouth daily. Swallow whole. 02/17/22   Patwardhan, Anabel Bene, MD  Cholecalciferol (VITAMIN D3) 1000 units CAPS Take 1 capsule by mouth daily.    [provider]  gabapentin (NEURONTIN) 800 MG tablet Take 800 mg by mouth 3 (three) times daily. 02/01/22   [provider]  HYDROcodone-acetaminophen (NORCO) 10-325 MG  tablet Take 1 tablet by mouth every 6 (six) hours as needed.    [provider]  ipratropium-albuterol (DUONEB) 0.5-2.5 (3) MG/3ML SOLN Take 3 mLs by nebulization every 6 (six) hours as needed. Patient not taking: Reported on 08/08/2023 06/06/23   Garrison, [place] N, FNP  losartan (COZAAR) 100 MG tablet Take 1 tablet (100 mg total) by mouth daily. Patient taking differently: Take 100 mg by mouth daily. 05/11/23   Tolia, Sunit, DO  metFORMIN (GLUCOPHAGE) 500 MG tablet Take 1 tablet (500 mg total) by mouth 2 (two) times daily with a meal. Resume on Sunday 02/19/2022 Patient taking differently: Take 500 mg by mouth 2 (two) times daily with a meal. Resume on Sunday 02/19/2022 02/17/22   Patwardhan, Anabel Bene, MD  metoprolol succinate (TOPROL-XL) 50 MG 24 hr tablet TAKE 1 TABLET(50 MG) BY MOUTH EVERY MORNING WITH OR IMMEDIATELY FOLLOWING A MEAL Patient taking differently: Take 50 mg by mouth daily. 07/10/23   Tolia, Sunit, DO  nitroGLYCERIN (NITROSTAT) 0.4 MG SL tablet PLACE 1 TABLET UNDER THE TONGUE EVERY 5 MINUTES AS NEEDED FOR CHEST  PAIN Patient taking differently: Place 0.4 mg under the tongue every 5 (five) minutes as needed for chest pain. 03/01/23   Tolia, Sunit, DO  rosuvastatin (CRESTOR) 40 MG tablet TAKE 1 TABLET BY MOUTH AT BEDTIME Patient taking differently: Take 40 mg by mouth at bedtime. 07/07/22   Tolia, Sunit, DO  tamsulosin (FLOMAX) 0.4 MG CAPS capsule Take 0.8 mg by mouth at bedtime.    [provider]  ticagrelor (BRILINTA) 90 MG TABS tablet Take 1 tablet (90 mg total) by mouth 2 (two) times daily. 03/16/23 03/15/24  Tolia, Sunit, DO  tiZANidine (ZANAFLEX) 4 MG tablet Take 4 mg by mouth every 8 (eight) hours as needed for muscle spasms. 02/15/23   [provider]  TRELEGY ELLIPTA 100-62.5-25 MCG/ACT AEPB Take 1 puff by mouth daily as needed. Pt not 12/21/21   [provider]    Family History Family History  Problem Relation Age of Onset   Diabetes Mother    Hypertension Father    Lupus Sister     Social History Social History   Tobacco Use   Smoking status: Former    Types: Cigarettes    Start date: 1992    Quit date: 1972    Years since quitting: 52.8   Smokeless tobacco: Never   Tobacco comments:    08-08-2023  per pt quit smoking 1992,  started age 27,   Vaping Use   Vaping status: Never Used  Substance Use Topics   Alcohol use: Yes    Alcohol/week: 7.0 standard drinks of alcohol    Types: 7 Cans of beer per week    Comment: 08-08-2023  one beer per night ,  16 oz   Drug use: Not Currently    Types: Marijuana    Comment: 10/01/2013 "last drug use was 10 yr ago"     Allergies   Patient has no known allergies.   Review of Systems Review of Systems  Respiratory:  Positive for shortness of breath.   All other systems reviewed and are negative.    Physical Exam Triage Vital Signs ED Triage Vitals [08/24/23 1508]  Encounter Vitals Group     BP 135/80     Systolic BP Percentile      Diastolic BP Percentile      Pulse Rate 88     Resp 18     Temp 98.6  F (37  C)     Temp Source Oral     SpO2 90 %     Weight 209 lb (94.8 kg)     Height 5\' 9"  (1.753 m)     Head Circumference      Peak Flow      Pain Score 0     Pain Loc      Pain Education      Exclude from Growth Chart    No data found.  Updated Vital Signs BP 135/80 (BP Location: Left Arm)   Pulse 88   Temp 98.6 F (37 C) (Oral)   Resp 18   Ht 5\' 9"  (1.753 m)   Wt 94.8 kg   SpO2 90%   BMI 30.86 kg/m   Visual Acuity Right Eye Distance:   Left Eye Distance:   Bilateral Distance:    Right Eye Near:   Left Eye Near:    Bilateral Near:     Physical Exam Vitals and nursing note reviewed.  Constitutional:      Appearance: He is well-developed.  HENT:     Head: Normocephalic.     Mouth/Throat:     Mouth: Mucous membranes are moist.  Cardiovascular:     Rate and Rhythm: Normal rate and regular rhythm.  Pulmonary:     Effort: Pulmonary effort is normal.     Breath sounds: Decreased breath sounds present.  Abdominal:     General: There is no distension.  Musculoskeletal:        General: Normal range of motion.     Cervical back: Normal range of motion.  Skin:    General: Skin is warm.  Neurological:     Mental Status: He is alert and oriented to person, place, and time.      UC Treatments / Results  Labs (all labs ordered are listed, but only abnormal results are displayed) Labs Reviewed - No data to display  EKG   Radiology No results found.  Procedures Procedures (including critical care time)  Medications Ordered in UC Medications  ipratropium-albuterol (DUONEB) 0.5-2.5 (3) MG/3ML nebulizer solution 3 mL (has no administration in time range)  methylPREDNISolone sodium succinate (SOLU-MEDROL) 125 mg/2 mL injection 125 mg (has no administration in time range)    Initial Impression / Assessment and Plan / UC Course  I have reviewed the triage vital signs and the nursing notes.  Pertinent labs & imaging results that were available during my  care of the patient were reviewed by me and considered in my medical decision making (see chart for details).     Pt given duoneb and solumedrol.  Pt given rx for prednsione.  Pt advised to return if any problems.   Final Clinical Impressions(s) / UC Diagnoses   Final diagnoses:  COPD exacerbation [facility])   Discharge Instructions   None    ED Prescriptions     Medication Sig Dispense Auth. Provider   predniSONE (DELTASONE) 50 MG tablet One tablet a day 5 tablet Elson Areas, [place]     An After Visit Summary was printed and given to the patient.     PDMP not reviewed this encounter.    Elson Areas, [place] 08/24/23 1615

## 2023-08-24 NOTE — ED Triage Notes (Signed)
Patient here today with c/o SOB X 3 days. He has a h/o COPD. He uses Albuterol nebulizer with a little relief. Worsens with walking and using stairs.

## 2023-08-24 NOTE — Discharge Instructions (Addendum)
See your Physician for recheck.  Go to the Emergency department if symptoms worsen or change.

## 2023-08-29 ENCOUNTER — Other Ambulatory Visit: Payer: Self-pay

## 2023-08-29 ENCOUNTER — Ambulatory Visit
Admission: RE | Admit: 2023-08-29 | Discharge: 2023-08-29 | Disposition: A | Discharge status: Home / Self Care | Payer: Medicare HMO | Source: Ambulatory Visit | Attending: Radiation Oncology | Admitting: Radiation Oncology

## 2023-08-29 DIAGNOSIS — Z510 Encounter for antineoplastic radiation therapy: Secondary | ICD-10-CM | POA: Diagnosis present

## 2023-08-29 DIAGNOSIS — C61 Malignant neoplasm of prostate: Secondary | ICD-10-CM | POA: Insufficient documentation

## 2023-08-29 LAB — RAD ONC ARIA SESSION SUMMARY
Course Elapsed Days: 0
Plan Fractions Treated to Date: 1
Plan Prescribed Dose Per Fraction: 1.8 Gy
Plan Total Fractions Prescribed: 25
Plan Total Prescribed Dose: 45 Gy
Reference Point Dosage Given to Date: 1.8 Gy
Reference Point Session Dosage Given: 1.8 Gy
Session Number: 1

## 2023-08-30 ENCOUNTER — Other Ambulatory Visit: Payer: Self-pay

## 2023-08-30 ENCOUNTER — Ambulatory Visit
Admission: RE | Admit: 2023-08-30 | Discharge: 2023-08-30 | Disposition: A | Discharge status: Home / Self Care | Payer: Medicare HMO | Source: Ambulatory Visit | Attending: Radiation Oncology | Admitting: Radiation Oncology

## 2023-08-30 DIAGNOSIS — Z510 Encounter for antineoplastic radiation therapy: Secondary | ICD-10-CM | POA: Diagnosis not present

## 2023-08-30 LAB — RAD ONC ARIA SESSION SUMMARY
Course Elapsed Days: 1
Plan Fractions Treated to Date: 2
Plan Prescribed Dose Per Fraction: 1.8 Gy
Plan Total Fractions Prescribed: 25
Plan Total Prescribed Dose: 45 Gy
Reference Point Dosage Given to Date: 3.6 Gy
Reference Point Session Dosage Given: 1.8 Gy
Session Number: 2

## 2023-08-31 ENCOUNTER — Other Ambulatory Visit: Payer: Self-pay

## 2023-08-31 ENCOUNTER — Ambulatory Visit: Payer: Medicare HMO

## 2023-08-31 ENCOUNTER — Ambulatory Visit
Admission: RE | Admit: 2023-08-31 | Discharge: 2023-08-31 | Disposition: A | Discharge status: Home / Self Care | Payer: Medicare HMO | Source: Ambulatory Visit | Attending: Radiation Oncology

## 2023-08-31 DIAGNOSIS — Z510 Encounter for antineoplastic radiation therapy: Secondary | ICD-10-CM | POA: Diagnosis not present

## 2023-08-31 LAB — RAD ONC ARIA SESSION SUMMARY
Course Elapsed Days: 2
Plan Fractions Treated to Date: 3
Plan Prescribed Dose Per Fraction: 1.8 Gy
Plan Total Fractions Prescribed: 25
Plan Total Prescribed Dose: 45 Gy
Reference Point Dosage Given to Date: 5.4 Gy
Reference Point Session Dosage Given: 1.8 Gy
Session Number: 3

## 2023-09-03 ENCOUNTER — Other Ambulatory Visit: Payer: Self-pay

## 2023-09-03 ENCOUNTER — Ambulatory Visit
Admission: RE | Admit: 2023-09-03 | Discharge: 2023-09-03 | Disposition: A | Discharge status: Home / Self Care | Payer: Medicare HMO | Source: Ambulatory Visit | Attending: Radiation Oncology | Admitting: Radiation Oncology

## 2023-09-03 DIAGNOSIS — Z510 Encounter for antineoplastic radiation therapy: Secondary | ICD-10-CM | POA: Diagnosis not present

## 2023-09-03 LAB — RAD ONC ARIA SESSION SUMMARY
Course Elapsed Days: 5
Plan Fractions Treated to Date: 4
Plan Prescribed Dose Per Fraction: 1.8 Gy
Plan Total Fractions Prescribed: 25
Plan Total Prescribed Dose: 45 Gy
Reference Point Dosage Given to Date: 7.2 Gy
Reference Point Session Dosage Given: 1.8 Gy
Session Number: 4

## 2023-09-04 ENCOUNTER — Ambulatory Visit
Admission: RE | Admit: 2023-09-04 | Discharge: 2023-09-04 | Disposition: A | Discharge status: Home / Self Care | Payer: Medicare HMO | Source: Ambulatory Visit | Attending: Radiation Oncology

## 2023-09-04 ENCOUNTER — Other Ambulatory Visit: Payer: Self-pay

## 2023-09-04 DIAGNOSIS — Z510 Encounter for antineoplastic radiation therapy: Secondary | ICD-10-CM | POA: Diagnosis not present

## 2023-09-04 LAB — RAD ONC ARIA SESSION SUMMARY
Course Elapsed Days: 6
Plan Fractions Treated to Date: 5
Plan Prescribed Dose Per Fraction: 1.8 Gy
Plan Total Fractions Prescribed: 25
Plan Total Prescribed Dose: 45 Gy
Reference Point Dosage Given to Date: 9 Gy
Reference Point Session Dosage Given: 1.8 Gy
Session Number: 5

## 2023-09-05 ENCOUNTER — Ambulatory Visit
Admission: RE | Admit: 2023-09-05 | Discharge: 2023-09-05 | Disposition: A | Discharge status: Home / Self Care | Payer: Medicare HMO | Source: Ambulatory Visit | Attending: Radiation Oncology | Admitting: Radiation Oncology

## 2023-09-05 ENCOUNTER — Other Ambulatory Visit: Payer: Self-pay

## 2023-09-05 DIAGNOSIS — Z510 Encounter for antineoplastic radiation therapy: Secondary | ICD-10-CM | POA: Diagnosis not present

## 2023-09-05 LAB — RAD ONC ARIA SESSION SUMMARY
Course Elapsed Days: 7
Plan Fractions Treated to Date: 6
Plan Prescribed Dose Per Fraction: 1.8 Gy
Plan Total Fractions Prescribed: 25
Plan Total Prescribed Dose: 45 Gy
Reference Point Dosage Given to Date: 10.8 Gy
Reference Point Session Dosage Given: 1.8 Gy
Session Number: 6

## 2023-09-06 ENCOUNTER — Ambulatory Visit
Admission: RE | Admit: 2023-09-06 | Discharge: 2023-09-06 | Disposition: A | Discharge status: Home / Self Care | Payer: Medicare HMO | Source: Ambulatory Visit | Attending: Radiation Oncology | Admitting: Radiation Oncology

## 2023-09-06 ENCOUNTER — Other Ambulatory Visit: Payer: Self-pay

## 2023-09-06 DIAGNOSIS — C61 Malignant neoplasm of prostate: Secondary | ICD-10-CM

## 2023-09-06 DIAGNOSIS — Z510 Encounter for antineoplastic radiation therapy: Secondary | ICD-10-CM | POA: Diagnosis not present

## 2023-09-06 LAB — RAD ONC ARIA SESSION SUMMARY
Course Elapsed Days: 8
Plan Fractions Treated to Date: 7
Plan Prescribed Dose Per Fraction: 1.8 Gy
Plan Total Fractions Prescribed: 25
Plan Total Prescribed Dose: 45 Gy
Reference Point Dosage Given to Date: 12.6 Gy
Reference Point Session Dosage Given: 1.8 Gy
Session Number: 7

## 2023-09-06 MED ORDER — SONAFINE EX EMUL
1.0000 | Freq: Two times a day (BID) | CUTANEOUS | Status: DC
  Administered 2023-09-06: 1 via TOPICAL

## 2023-09-07 ENCOUNTER — Ambulatory Visit
Admission: RE | Admit: 2023-09-07 | Discharge: 2023-09-07 | Disposition: A | Discharge status: Home / Self Care | Payer: Medicare HMO | Source: Ambulatory Visit | Attending: Radiation Oncology

## 2023-09-07 ENCOUNTER — Other Ambulatory Visit: Payer: Self-pay

## 2023-09-07 DIAGNOSIS — Z510 Encounter for antineoplastic radiation therapy: Secondary | ICD-10-CM | POA: Diagnosis not present

## 2023-09-07 LAB — RAD ONC ARIA SESSION SUMMARY
Course Elapsed Days: 9
Plan Fractions Treated to Date: 8
Plan Prescribed Dose Per Fraction: 1.8 Gy
Plan Total Fractions Prescribed: 25
Plan Total Prescribed Dose: 45 Gy
Reference Point Dosage Given to Date: 14.4 Gy
Reference Point Session Dosage Given: 1.8 Gy
Session Number: 8

## 2023-09-08 ENCOUNTER — Emergency Department (HOSPITAL_COMMUNITY): Payer: Medicare HMO

## 2023-09-08 ENCOUNTER — Encounter (HOSPITAL_COMMUNITY): Payer: Self-pay

## 2023-09-08 ENCOUNTER — Emergency Department (HOSPITAL_COMMUNITY)
Admission: EM | Admit: 2023-09-08 | Discharge: 2023-09-08 | Disposition: A | Discharge status: Home / Self Care | Payer: Medicare HMO | Attending: Emergency Medicine | Admitting: Emergency Medicine

## 2023-09-08 DIAGNOSIS — Z79899 Other long term (current) drug therapy: Secondary | ICD-10-CM | POA: Insufficient documentation

## 2023-09-08 DIAGNOSIS — R102 Pelvic and perineal pain: Secondary | ICD-10-CM | POA: Diagnosis present

## 2023-09-08 DIAGNOSIS — J449 Chronic obstructive pulmonary disease, unspecified: Secondary | ICD-10-CM | POA: Diagnosis not present

## 2023-09-08 DIAGNOSIS — L0291 Cutaneous abscess, unspecified: Secondary | ICD-10-CM

## 2023-09-08 DIAGNOSIS — I10 Essential (primary) hypertension: Secondary | ICD-10-CM | POA: Diagnosis not present

## 2023-09-08 DIAGNOSIS — E119 Type 2 diabetes mellitus without complications: Secondary | ICD-10-CM | POA: Diagnosis not present

## 2023-09-08 DIAGNOSIS — Z8546 Personal history of malignant neoplasm of prostate: Secondary | ICD-10-CM | POA: Diagnosis not present

## 2023-09-08 DIAGNOSIS — Z7984 Long term (current) use of oral hypoglycemic drugs: Secondary | ICD-10-CM | POA: Insufficient documentation

## 2023-09-08 DIAGNOSIS — I2510 Atherosclerotic heart disease of native coronary artery without angina pectoris: Secondary | ICD-10-CM | POA: Diagnosis not present

## 2023-09-08 DIAGNOSIS — N492 Inflammatory disorders of scrotum: Secondary | ICD-10-CM | POA: Diagnosis not present

## 2023-09-08 DIAGNOSIS — Z7982 Long term (current) use of aspirin: Secondary | ICD-10-CM | POA: Insufficient documentation

## 2023-09-08 LAB — BASIC METABOLIC PANEL
Anion gap: 6 (ref 5–15)
BUN: 21 mg/dL (ref 8–23)
CO2: 28 mmol/L (ref 22–32)
Calcium: 9.5 mg/dL (ref 8.9–10.3)
Chloride: 101 mmol/L (ref 98–111)
Creatinine, Ser: 1.19 mg/dL (ref 0.61–1.24)
GFR, Estimated: >60 mL/min (ref >60)
Glucose, Bld: 110 mg/dL — ABNORMAL HIGH (ref 70–99)
Potassium: 4.3 mmol/L (ref 3.5–5.1)
Sodium: 135 mmol/L (ref 135–145)

## 2023-09-08 LAB — CBC WITH DIFFERENTIAL/PLATELET
Abs Immature Granulocytes: 0.04 10*3/uL (ref 0.00–0.07)
Basophils Absolute: 0 10*3/uL (ref 0.0–0.1)
Basophils Relative: 0 %
Eosinophils Absolute: 0.2 10*3/uL (ref 0.0–0.5)
Eosinophils Relative: 2 %
HCT: 37.9 % — ABNORMAL LOW (ref 39.0–52.0)
Hemoglobin: 11.6 g/dL — ABNORMAL LOW (ref 13.0–17.0)
Immature Granulocytes: 0 %
Lymphocytes Relative: 4 %
Lymphs Abs: 0.4 10*3/uL — ABNORMAL LOW (ref 0.7–4.0)
MCH: 26.5 pg (ref 26.0–34.0)
MCHC: 30.6 g/dL (ref 30.0–36.0)
MCV: 86.7 fL (ref 80.0–100.0)
Monocytes Absolute: 0.7 10*3/uL (ref 0.1–1.0)
Monocytes Relative: 7 %
Neutro Abs: 7.9 10*3/uL — ABNORMAL HIGH (ref 1.7–7.7)
Neutrophils Relative %: 87 %
Platelets: 202 10*3/uL (ref 150–400)
RBC: 4.37 MIL/uL (ref 4.22–5.81)
RDW: 15.1 % (ref 11.5–15.5)
WBC: 9.2 10*3/uL (ref 4.0–10.5)
nRBC: 0 % (ref 0.0–0.2)

## 2023-09-08 MED ORDER — AMOXICILLIN-POT CLAVULANATE 875-125 MG PO TABS: 1.0000 | ORAL_TABLET | Freq: Two times a day (BID) | ORAL | 0 refills | Status: DC

## 2023-09-08 MED ORDER — HYDROCODONE-ACETAMINOPHEN 5-325 MG PO TABS
1.0000 | ORAL_TABLET | Freq: Once | ORAL | Status: AC
  Administered 2023-09-08: 1 via ORAL
  Filled 2023-09-08: qty 1

## 2023-09-08 MED ORDER — IOHEXOL 300 MG/ML  SOLN
100.0000 mL | Freq: Once | INTRAMUSCULAR | Status: AC | PRN
  Administered 2023-09-08: 100 mL via INTRAVENOUS

## 2023-09-08 NOTE — ED Triage Notes (Addendum)
Pt c/o increasing abscess/bump "under testicles" follow radiation seed implantation x1 month ago.  Pain score 10/10.  Pt has been seen by Oncologist and told it shouldn't be from the radiation.

## 2023-09-08 NOTE — ED Provider Notes (Signed)
I provided a substantive portion of the care of this patient.  I personally made/approved the management plan for this patient and take responsibility for the patient management.      72 year old presents with swelling under his testicles and his perineum x 1 month.  On exam here, no evidence of abscess.  He denies any fever or chills.  Will obtain imaging and reassess   Lorre Nick, MD 09/08/23 1036

## 2023-09-08 NOTE — ED Provider Notes (Signed)
[place] [facility]PART[facility] AT [facility] Provider Note   CSN: [id number] Arrival date & time: 09/08/23  7829     History  Chief Complaint  Patient presents with   Abscess   CA Pt    Evan Brock is a 72 y.o. male with past medical history of hypertension, COPD, coronary artery disease, type 2 diabetes, malignant neoplasm of prostate presenting to emergency room with perianal pain, significant discomfort has been present for 2 weeks, feels like it is getting worse. Patient reports he started radiation 2 weeks ago. Patient discussed finding with oncologist. No obvious abscess or draining of area. Denies fevers, chills, chest pain, SOB, change in BM. Has never had anything like this before.    Abscess      Home Medications Prior to Admission medications   Medication Sig Start Date End Date Taking? Authorizing Provider  albuterol (PROVENTIL) (2.5 MG/3ML) 0.083% nebulizer solution Inhale 3 mLs (2.5 mg total) by nebulization every 4 (four) hours as needed for wheezing or shortness of breath. Patient taking differently: Take 2.5 mg by nebulization every 4 (four) hours as needed for wheezing or shortness of breath. Per pt usually does daily in am as prevention 09/11/22   Jerre Simon, MD  albuterol (VENTOLIN HFA) 108 (90 Base) MCG/ACT inhaler INHALE 1 PUFF FOUR TIMES DAILY, AS NEEDED Patient taking differently: Inhale 2 puffs into the lungs every 4 (four) hours as needed for wheezing or shortness of breath. 10/26/21 08/14/23  Raspet, Noberto Retort, PA-C  Ascorbic Acid (VITAMIN C) 1000 MG tablet Take 2 tablets by mouth daily.    [provider]  aspirin 81 MG EC tablet Take 1 tablet (81 mg total) by mouth daily. Swallow whole. 02/17/22   Patwardhan, Anabel Bene, MD  Cholecalciferol (VITAMIN D3) 1000 units CAPS Take 1 capsule by mouth daily.    [provider]  gabapentin (NEURONTIN) 800 MG tablet Take 800 mg by mouth 3 (three) times daily. 02/01/22   [provider]  HYDROcodone-acetaminophen (NORCO) 10-325 MG tablet Take 1 tablet by mouth every 6 (six) hours as needed.    [provider]  ipratropium-albuterol (DUONEB) 0.5-2.5 (3) MG/3ML SOLN Take 3 mLs by nebulization every 6 (six) hours as needed. Patient not taking: Reported on 08/08/2023 06/06/23   Garrison, [place] N, FNP  losartan (COZAAR) 100 MG tablet Take 1 tablet (100 mg total) by mouth daily. Patient taking differently: Take 100 mg by mouth daily. 05/11/23   Tolia, Sunit, DO  metFORMIN (GLUCOPHAGE) 500 MG tablet Take 1 tablet (500 mg total) by mouth 2 (two) times daily with a meal. Resume on Sunday 02/19/2022 Patient taking differently: Take 500 mg by mouth 2 (two) times daily with a meal. Resume on Sunday 02/19/2022 02/17/22   Patwardhan, Anabel Bene, MD  metoprolol succinate (TOPROL-XL) 50 MG 24 hr tablet TAKE 1 TABLET(50 MG) BY MOUTH EVERY MORNING WITH OR IMMEDIATELY FOLLOWING A MEAL Patient taking differently: Take 50 mg by mouth daily. 07/10/23   Tolia, Sunit, DO  nitroGLYCERIN (NITROSTAT) 0.4 MG SL tablet PLACE 1 TABLET UNDER THE TONGUE EVERY 5 MINUTES AS NEEDED FOR CHEST PAIN Patient taking differently: Place 0.4 mg under the tongue every 5 (five) minutes as needed for chest pain. 03/01/23   Odis Hollingshead, Sunit, DO  predniSONE (DELTASONE) 50 MG tablet One tablet a day 08/24/23   Cheron Schaumann K, PA-C  rosuvastatin (CRESTOR) 40 MG tablet TAKE 1 TABLET BY MOUTH AT BEDTIME Patient taking differently: Take 40 mg by mouth at  bedtime. 07/07/22   Tolia, Sunit, DO  tamsulosin (FLOMAX) 0.4 MG CAPS capsule Take 0.8 mg by mouth at bedtime.    [provider]  ticagrelor (BRILINTA) 90 MG TABS tablet Take 1 tablet (90 mg total) by mouth 2 (two) times daily. 03/16/23 03/15/24  Tolia, Sunit, DO  tiZANidine (ZANAFLEX) 4 MG tablet Take 4 mg by mouth every 8 (eight) hours as needed for muscle spasms. 02/15/23   [provider]  TRELEGY ELLIPTA 100-62.5-25 MCG/ACT AEPB Take 1 puff by mouth  daily as needed. Pt not 12/21/21   [provider]      Allergies    Patient has no known allergies.    Review of Systems   Review of Systems  Genitourinary:        Pain  Skin:        Perianal pain    Physical Exam Updated Vital Signs BP 120/82 (BP Location: Left Arm)   Pulse 93   Temp 98.4 F (36.9 C) (Oral)   Resp 18   Ht 5\' 9"  (1.753 m)   Wt 94.8 kg   SpO2 95%   BMI 30.86 kg/m  Physical Exam Vitals and nursing note reviewed.  Constitutional:      General: He is not in acute distress.    Appearance: He is not toxic-appearing.  HENT:     Head: Normocephalic and atraumatic.  Eyes:     General: No scleral icterus.    Conjunctiva/sclera: Conjunctivae normal.  Cardiovascular:     Rate and Rhythm: Normal rate and regular rhythm.     Pulses: Normal pulses.     Heart sounds: Normal heart sounds.  Pulmonary:     Effort: Pulmonary effort is normal. No respiratory distress.     Breath sounds: Normal breath sounds.  Abdominal:     General: Abdomen is flat. Bowel sounds are normal.     Palpations: Abdomen is soft.     Tenderness: There is no abdominal tenderness.  Genitourinary:    Comments: No obvious area of fluctuance where patient is having pain.  No obvious surrounding erythema or abscess.  No area of drainage. Skin:    General: Skin is warm and dry.     Findings: No lesion.  Neurological:     General: No focal deficit present.     Mental Status: He is alert and oriented to person, place, and time. Mental status is at baseline.     ED Results / Procedures / Treatments   Labs (all labs ordered are listed, but only abnormal results are displayed) Labs Reviewed  CBC WITH DIFFERENTIAL/PLATELET - Abnormal; Notable for the following components:      Result Value   Hemoglobin 11.6 (*)    HCT 37.9 (*)    Neutro Abs 7.9 (*)    Lymphs Abs 0.4 (*)    All other components within normal limits  BASIC METABOLIC PANEL - Abnormal; Notable for the following  components:   Glucose, Bld 110 (*)    All other components within normal limits    EKG None  Radiology No results found.  Procedures Procedures    Medications Ordered in ED Medications - No data to display  ED Course/ Medical Decision Making/ A&P                                 Medical Decision Making Amount and/or Complexity of Data Reviewed Labs: ordered. Radiology: ordered.  Risk Prescription drug management.   This patient presents to the ED for concern of perianal pain, this involves an extensive number of treatment options, and is a complaint that carries with it a high risk of complications and morbidity.  The differential diagnosis includes abscess, cellulitis, constipation   Co morbidities that complicate the patient evaluation  hypertension, COPD, coronary artery disease, type 2 diabetes, malignant neoplasm of prostate    Additional history obtained:  Additional history obtained from 08/24/23   Lab Tests:  I personally interpreted labs.  The pertinent results include:   BMP without electrolyte abnormality CBC with differential, no leukocytosis, no significant anemia   Imaging Studies ordered:  I ordered imaging studies including CT abdomen pelvis I independently visualized and interpreted imaging which showed poorly visualized perineum secondary to bilateral hip replacement with perianal abnormality that could represent abscess versus fistula, diverticulosis, bronchiectasis, aortic valve calcification, arthrosclerosis.  Discussed findings with patient. I agree with the radiologist interpretation   Cardiac Monitoring: / EKG:  The patient was maintained on a cardiac monitor.   Consultations Obtained:  I requested consultation with the General Surgery,  and discussed lab and imaging findings as well as pertinent plan - they recommend: trial of out patient antibiotics and follow up outpatient with them, they do not see obvious area of drainage  abscess on CT scan.   Problem List / ED Course / Critical interventions / Medication management  Patient presenting to emergency room with perianal pain.  Over area of pain there is no obvious area of fluctuance, no surrounding erythema suggesting cellulitis.  Ordered imaging to further evaluate symptoms.  Patient has unremarkable lab results without leukocytosis.  Patient is well-appearing and hemodynamically stable throughout stay.  CT scan shows possible area of perianal abnormality without any obvious abscess.  Discussed imaging and labs with general surgery who recommend trial of outpatient antibiotics with outpatient follow-up.  Patient family agree.  I ordered medication including norco  for pain  Reevaluation of the patient after these medicines showed that the patient improved I have reviewed the patients home medicines and have made adjustments as needed   Plan  F/u w/ PCP in 2-3d to ensure resolution of sx.  Patient was given return precautions. Patient stable for discharge at this time.  Patient educated on sx/dx and verbalized understanding of plan. Return to ER w/ new or worsening sx.          Final Clinical Impression(s) / ED Diagnoses Final diagnoses:  Abscess    Rx / DC Orders ED Discharge Orders     None         Reinaldo Raddle 09/08/23 1252    Lorre Nick, MD 09/09/23 [phone]

## 2023-09-08 NOTE — Discharge Instructions (Addendum)
You were seen in the emergency room today for concern for abscess.  Your CT scan shows no obvious area of the abscess.  I have consulted and spoke with general surgeon who recommend trial of outpatient antibiotics and follow-up in their office.  Dr. Doreen Salvage information is attached to your discharge paperwork please call to schedule follow-up appointment.  I recommend sitz baths. You can alternate Tylenol and ibuprofen for pain control.   Return to emergency room with new or worsening symptoms.

## 2023-09-10 ENCOUNTER — Ambulatory Visit
Admission: RE | Admit: 2023-09-10 | Discharge: 2023-09-10 | Disposition: A | Discharge status: Home / Self Care | Payer: Medicare HMO | Source: Ambulatory Visit | Attending: Radiation Oncology | Admitting: Radiation Oncology

## 2023-09-10 ENCOUNTER — Other Ambulatory Visit: Payer: Self-pay

## 2023-09-10 DIAGNOSIS — N493 Fournier gangrene: Secondary | ICD-10-CM | POA: Diagnosis not present

## 2023-09-10 DIAGNOSIS — A419 Sepsis, unspecified organism: Secondary | ICD-10-CM | POA: Diagnosis not present

## 2023-09-10 LAB — RAD ONC ARIA SESSION SUMMARY
Course Elapsed Days: 12
Plan Fractions Treated to Date: 9
Plan Prescribed Dose Per Fraction: 1.8 Gy
Plan Total Fractions Prescribed: 25
Plan Total Prescribed Dose: 45 Gy
Reference Point Dosage Given to Date: 16.2 Gy
Reference Point Session Dosage Given: 1.8 Gy
Session Number: 9

## 2023-09-11 ENCOUNTER — Other Ambulatory Visit: Payer: Self-pay

## 2023-09-11 ENCOUNTER — Ambulatory Visit
Admission: RE | Admit: 2023-09-11 | Discharge: 2023-09-11 | Disposition: A | Discharge status: Home / Self Care | Payer: Medicare HMO | Source: Ambulatory Visit | Attending: Radiation Oncology | Admitting: Radiation Oncology

## 2023-09-11 LAB — RAD ONC ARIA SESSION SUMMARY
Course Elapsed Days: 13
Plan Fractions Treated to Date: 10
Plan Prescribed Dose Per Fraction: 1.8 Gy
Plan Total Fractions Prescribed: 25
Plan Total Prescribed Dose: 45 Gy
Reference Point Dosage Given to Date: 18 Gy
Reference Point Session Dosage Given: 1.8 Gy
Session Number: 10

## 2023-09-12 ENCOUNTER — Other Ambulatory Visit: Payer: Self-pay

## 2023-09-12 ENCOUNTER — Ambulatory Visit
Admission: RE | Admit: 2023-09-12 | Discharge: 2023-09-12 | Disposition: A | Discharge status: Home / Self Care | Payer: Medicare HMO | Source: Ambulatory Visit | Attending: Radiation Oncology

## 2023-09-12 LAB — RAD ONC ARIA SESSION SUMMARY
Course Elapsed Days: 14
Plan Fractions Treated to Date: 11
Plan Prescribed Dose Per Fraction: 1.8 Gy
Plan Total Fractions Prescribed: 25
Plan Total Prescribed Dose: 45 Gy
Reference Point Dosage Given to Date: 19.8 Gy
Reference Point Session Dosage Given: 1.8 Gy
Session Number: 11

## 2023-09-13 ENCOUNTER — Inpatient Hospital Stay (HOSPITAL_COMMUNITY): Payer: Medicare HMO | Admitting: Certified Registered Nurse Anesthetist

## 2023-09-13 ENCOUNTER — Inpatient Hospital Stay (HOSPITAL_COMMUNITY)
Admission: EM | Admit: 2023-09-13 | Discharge: 2023-09-27 | DRG: 853 | Disposition: A | Discharge status: Home Health | Payer: Medicare HMO | Attending: Internal Medicine | Admitting: Internal Medicine

## 2023-09-13 ENCOUNTER — Ambulatory Visit: Payer: Medicare HMO

## 2023-09-13 ENCOUNTER — Emergency Department (HOSPITAL_COMMUNITY): Payer: Medicare HMO

## 2023-09-13 ENCOUNTER — Telehealth: Payer: Self-pay

## 2023-09-13 ENCOUNTER — Encounter (HOSPITAL_COMMUNITY): Payer: Self-pay

## 2023-09-13 ENCOUNTER — Encounter (HOSPITAL_COMMUNITY)
Admission: EM | Disposition: A | Discharge status: Home Health | Payer: Self-pay | Source: Home / Self Care | Attending: Internal Medicine

## 2023-09-13 ENCOUNTER — Other Ambulatory Visit: Payer: Self-pay

## 2023-09-13 DIAGNOSIS — E1152 Type 2 diabetes mellitus with diabetic peripheral angiopathy with gangrene: Secondary | ICD-10-CM | POA: Diagnosis present

## 2023-09-13 DIAGNOSIS — Z7901 Long term (current) use of anticoagulants: Secondary | ICD-10-CM | POA: Diagnosis not present

## 2023-09-13 DIAGNOSIS — N400 Enlarged prostate without lower urinary tract symptoms: Secondary | ICD-10-CM | POA: Diagnosis present

## 2023-09-13 DIAGNOSIS — I10 Essential (primary) hypertension: Secondary | ICD-10-CM | POA: Diagnosis present

## 2023-09-13 DIAGNOSIS — Z96643 Presence of artificial hip joint, bilateral: Secondary | ICD-10-CM | POA: Diagnosis present

## 2023-09-13 DIAGNOSIS — Z7982 Long term (current) use of aspirin: Secondary | ICD-10-CM

## 2023-09-13 DIAGNOSIS — L02215 Cutaneous abscess of perineum: Secondary | ICD-10-CM | POA: Diagnosis present

## 2023-09-13 DIAGNOSIS — Z833 Family history of diabetes mellitus: Secondary | ICD-10-CM

## 2023-09-13 DIAGNOSIS — E1165 Type 2 diabetes mellitus with hyperglycemia: Secondary | ICD-10-CM | POA: Diagnosis present

## 2023-09-13 DIAGNOSIS — J4489 Other specified chronic obstructive pulmonary disease: Secondary | ICD-10-CM | POA: Diagnosis present

## 2023-09-13 DIAGNOSIS — R6521 Severe sepsis with septic shock: Secondary | ICD-10-CM | POA: Diagnosis present

## 2023-09-13 DIAGNOSIS — Z77090 Contact with and (suspected) exposure to asbestos: Secondary | ICD-10-CM | POA: Diagnosis present

## 2023-09-13 DIAGNOSIS — F419 Anxiety disorder, unspecified: Secondary | ICD-10-CM | POA: Diagnosis present

## 2023-09-13 DIAGNOSIS — E872 Acidosis, unspecified: Secondary | ICD-10-CM | POA: Diagnosis present

## 2023-09-13 DIAGNOSIS — K5900 Constipation, unspecified: Secondary | ICD-10-CM | POA: Diagnosis not present

## 2023-09-13 DIAGNOSIS — Z8269 Family history of other diseases of the musculoskeletal system and connective tissue: Secondary | ICD-10-CM

## 2023-09-13 DIAGNOSIS — J4540 Moderate persistent asthma, uncomplicated: Secondary | ICD-10-CM | POA: Diagnosis present

## 2023-09-13 DIAGNOSIS — N179 Acute kidney failure, unspecified: Secondary | ICD-10-CM | POA: Diagnosis present

## 2023-09-13 DIAGNOSIS — K610 Anal abscess: Secondary | ICD-10-CM | POA: Diagnosis not present

## 2023-09-13 DIAGNOSIS — R578 Other shock: Secondary | ICD-10-CM | POA: Diagnosis not present

## 2023-09-13 DIAGNOSIS — K611 Rectal abscess: Secondary | ICD-10-CM | POA: Diagnosis not present

## 2023-09-13 DIAGNOSIS — I252 Old myocardial infarction: Secondary | ICD-10-CM

## 2023-09-13 DIAGNOSIS — N492 Inflammatory disorders of scrotum: Secondary | ICD-10-CM | POA: Diagnosis present

## 2023-09-13 DIAGNOSIS — J479 Bronchiectasis, uncomplicated: Secondary | ICD-10-CM | POA: Diagnosis present

## 2023-09-13 DIAGNOSIS — Z87438 Personal history of other diseases of male genital organs: Secondary | ICD-10-CM | POA: Diagnosis not present

## 2023-09-13 DIAGNOSIS — N493 Fournier gangrene: Secondary | ICD-10-CM | POA: Diagnosis present

## 2023-09-13 DIAGNOSIS — Z7984 Long term (current) use of oral hypoglycemic drugs: Secondary | ICD-10-CM

## 2023-09-13 DIAGNOSIS — G894 Chronic pain syndrome: Secondary | ICD-10-CM | POA: Diagnosis present

## 2023-09-13 DIAGNOSIS — Z955 Presence of coronary angioplasty implant and graft: Secondary | ICD-10-CM

## 2023-09-13 DIAGNOSIS — J449 Chronic obstructive pulmonary disease, unspecified: Secondary | ICD-10-CM | POA: Diagnosis not present

## 2023-09-13 DIAGNOSIS — I2510 Atherosclerotic heart disease of native coronary artery without angina pectoris: Secondary | ICD-10-CM | POA: Diagnosis present

## 2023-09-13 DIAGNOSIS — D630 Anemia in neoplastic disease: Secondary | ICD-10-CM | POA: Diagnosis present

## 2023-09-13 DIAGNOSIS — J982 Interstitial emphysema: Secondary | ICD-10-CM | POA: Diagnosis present

## 2023-09-13 DIAGNOSIS — E119 Type 2 diabetes mellitus without complications: Secondary | ICD-10-CM

## 2023-09-13 DIAGNOSIS — C61 Malignant neoplasm of prostate: Secondary | ICD-10-CM | POA: Diagnosis present

## 2023-09-13 DIAGNOSIS — D649 Anemia, unspecified: Secondary | ICD-10-CM | POA: Diagnosis not present

## 2023-09-13 DIAGNOSIS — E669 Obesity, unspecified: Secondary | ICD-10-CM | POA: Diagnosis present

## 2023-09-13 DIAGNOSIS — Z8249 Family history of ischemic heart disease and other diseases of the circulatory system: Secondary | ICD-10-CM

## 2023-09-13 DIAGNOSIS — Z09 Encounter for follow-up examination after completed treatment for conditions other than malignant neoplasm: Secondary | ICD-10-CM | POA: Diagnosis not present

## 2023-09-13 DIAGNOSIS — Z6831 Body mass index (BMI) 31.0-31.9, adult: Secondary | ICD-10-CM

## 2023-09-13 DIAGNOSIS — E876 Hypokalemia: Secondary | ICD-10-CM | POA: Diagnosis present

## 2023-09-13 DIAGNOSIS — A419 Sepsis, unspecified organism: Secondary | ICD-10-CM | POA: Diagnosis present

## 2023-09-13 DIAGNOSIS — Z7902 Long term (current) use of antithrombotics/antiplatelets: Secondary | ICD-10-CM

## 2023-09-13 DIAGNOSIS — R112 Nausea with vomiting, unspecified: Secondary | ICD-10-CM | POA: Diagnosis not present

## 2023-09-13 DIAGNOSIS — G4733 Obstructive sleep apnea (adult) (pediatric): Secondary | ICD-10-CM | POA: Diagnosis present

## 2023-09-13 DIAGNOSIS — E782 Mixed hyperlipidemia: Secondary | ICD-10-CM | POA: Diagnosis present

## 2023-09-13 DIAGNOSIS — Z79899 Other long term (current) drug therapy: Secondary | ICD-10-CM

## 2023-09-13 DIAGNOSIS — Z87891 Personal history of nicotine dependence: Secondary | ICD-10-CM

## 2023-09-13 HISTORY — PX: INCISION AND DRAINAGE ABSCESS: SHX5864

## 2023-09-13 LAB — CBC
HCT: 34.4 % — ABNORMAL LOW (ref 39.0–52.0)
Hemoglobin: 10.4 g/dL — ABNORMAL LOW (ref 13.0–17.0)
MCH: 26.2 pg (ref 26.0–34.0)
MCHC: 30.2 g/dL (ref 30.0–36.0)
MCV: 86.6 fL (ref 80.0–100.0)
Platelets: 164 10*3/uL (ref 150–400)
RBC: 3.97 MIL/uL — ABNORMAL LOW (ref 4.22–5.81)
RDW: 15.7 % — ABNORMAL HIGH (ref 11.5–15.5)
WBC: 10.2 10*3/uL (ref 4.0–10.5)
nRBC: 0 % (ref 0.0–0.2)

## 2023-09-13 LAB — GLUCOSE, CAPILLARY
Glucose-Capillary: 107 mg/dL — ABNORMAL HIGH (ref 70–99)
Glucose-Capillary: 110 mg/dL — ABNORMAL HIGH (ref 70–99)
Glucose-Capillary: 121 mg/dL — ABNORMAL HIGH (ref 70–99)
Glucose-Capillary: 96 mg/dL (ref 70–99)

## 2023-09-13 LAB — URINALYSIS, ROUTINE W REFLEX MICROSCOPIC
Bilirubin Urine: NEGATIVE
Glucose, UA: NEGATIVE mg/dL
Ketones, ur: NEGATIVE mg/dL
Leukocytes,Ua: NEGATIVE
Nitrite: NEGATIVE
Protein, ur: 100 mg/dL — AB
Specific Gravity, Urine: 1.015 (ref 1.005–1.030)
pH: 5 (ref 5.0–8.0)

## 2023-09-13 LAB — COMPREHENSIVE METABOLIC PANEL
ALT: 19 U/L (ref 0–44)
AST: 16 U/L (ref 15–41)
Albumin: 2.9 g/dL — ABNORMAL LOW (ref 3.5–5.0)
Alkaline Phosphatase: 54 U/L (ref 38–126)
Anion gap: 11 (ref 5–15)
BUN: 44 mg/dL — ABNORMAL HIGH (ref 8–23)
CO2: 18 mmol/L — ABNORMAL LOW (ref 22–32)
Calcium: 8.4 mg/dL — ABNORMAL LOW (ref 8.9–10.3)
Chloride: 105 mmol/L (ref 98–111)
Creatinine, Ser: 3.72 mg/dL — ABNORMAL HIGH (ref 0.61–1.24)
GFR, Estimated: 16 mL/min — ABNORMAL LOW (ref >60)
Glucose, Bld: 151 mg/dL — ABNORMAL HIGH (ref 70–99)
Potassium: 4 mmol/L (ref 3.5–5.1)
Sodium: 134 mmol/L — ABNORMAL LOW (ref 135–145)
Total Bilirubin: 0.4 mg/dL (ref <1.2)
Total Protein: 6.2 g/dL — ABNORMAL LOW (ref 6.5–8.1)

## 2023-09-13 LAB — I-STAT CG4 LACTIC ACID, ED
Lactic Acid, Venous: 1.5 mmol/L (ref 0.5–1.9)
Lactic Acid, Venous: 1.3 mmol/L (ref 0.5–1.9)

## 2023-09-13 LAB — HEMOGLOBIN A1C
Hgb A1c MFr Bld: 7 % — ABNORMAL HIGH (ref 4.8–5.6)
Mean Plasma Glucose: 154.2 mg/dL

## 2023-09-13 LAB — TYPE AND SCREEN
ABO/RH(D): A POS
Antibody Screen: NEGATIVE

## 2023-09-13 LAB — CBG MONITORING, ED: Glucose-Capillary: 113 mg/dL — ABNORMAL HIGH (ref 70–99)

## 2023-09-13 SURGERY — INCISION AND DRAINAGE, ABSCESS
Anesthesia: General

## 2023-09-13 MED ORDER — FLUTICASONE FUROATE-VILANTEROL 100-25 MCG/ACT IN AEPB
1.0000 | INHALATION_SPRAY | Freq: Every day | RESPIRATORY_TRACT | Status: DC
  Administered 2023-09-14: 1 via RESPIRATORY_TRACT
  Filled 2023-09-13: qty 28

## 2023-09-13 MED ORDER — LIDOCAINE HCL (PF) 2 % IJ SOLN
INTRAMUSCULAR | Status: AC
  Filled 2023-09-13: qty 5

## 2023-09-13 MED ORDER — PHENYLEPHRINE 80 MCG/ML (10ML) SYRINGE FOR IV PUSH (FOR BLOOD PRESSURE SUPPORT)
PREFILLED_SYRINGE | INTRAVENOUS | Status: DC | PRN
  Administered 2023-09-13 (×2): 160 ug via INTRAVENOUS
  Administered 2023-09-13: 80 ug via INTRAVENOUS
  Administered 2023-09-13: 120 ug via INTRAVENOUS
  Administered 2023-09-13 (×3): 160 ug via INTRAVENOUS

## 2023-09-13 MED ORDER — PANTOPRAZOLE SODIUM 40 MG IV SOLR
40.0000 mg | INTRAVENOUS | Status: DC
  Administered 2023-09-13 – 2023-09-16 (×3): 40 mg via INTRAVENOUS
  Filled 2023-09-13 (×4): qty 10

## 2023-09-13 MED ORDER — IPRATROPIUM-ALBUTEROL 0.5-2.5 (3) MG/3ML IN SOLN
3.0000 mL | Freq: Four times a day (QID) | RESPIRATORY_TRACT | Status: DC | PRN
  Administered 2023-09-13: 3 mL via RESPIRATORY_TRACT
  Filled 2023-09-13: qty 3

## 2023-09-13 MED ORDER — CHLORHEXIDINE GLUCONATE CLOTH 2 % EX PADS
6.0000 | MEDICATED_PAD | Freq: Every day | CUTANEOUS | Status: DC
  Administered 2023-09-14 – 2023-09-25 (×12): 6 via TOPICAL

## 2023-09-13 MED ORDER — PHENYLEPHRINE 80 MCG/ML (10ML) SYRINGE FOR IV PUSH (FOR BLOOD PRESSURE SUPPORT)
PREFILLED_SYRINGE | INTRAVENOUS | Status: AC
Start: 2023-09-13 — End: ?
  Filled 2023-09-13: qty 10

## 2023-09-13 MED ORDER — LACTATED RINGERS IV BOLUS
1000.0000 mL | Freq: Once | INTRAVENOUS | Status: AC
Start: 2023-09-13 — End: 2023-09-13
  Administered 2023-09-13: 1000 mL via INTRAVENOUS

## 2023-09-13 MED ORDER — ACETAMINOPHEN 500 MG PO TABS
ORAL_TABLET | ORAL | Status: AC
  Administered 2023-09-13: 1000 mg via ORAL
  Filled 2023-09-13: qty 2

## 2023-09-13 MED ORDER — HEPARIN SODIUM (PORCINE) 5000 UNIT/ML IJ SOLN
5000.0000 [IU] | Freq: Three times a day (TID) | INTRAMUSCULAR | Status: DC
  Administered 2023-09-13 – 2023-09-27 (×39): 5000 [IU] via SUBCUTANEOUS
  Filled 2023-09-13 (×40): qty 1

## 2023-09-13 MED ORDER — VASOPRESSIN 20 UNIT/ML IV SOLN
INTRAVENOUS | Status: DC | PRN
Start: 2023-09-13 — End: 2023-09-13
  Administered 2023-09-13 (×2): 2 [IU] via INTRAVENOUS

## 2023-09-13 MED ORDER — FENTANYL CITRATE (PF) 100 MCG/2ML IJ SOLN
INTRAMUSCULAR | Status: AC
  Filled 2023-09-13: qty 2

## 2023-09-13 MED ORDER — VANCOMYCIN HCL IN DEXTROSE 1-5 GM/200ML-% IV SOLN: 1000.0000 mg | INTRAVENOUS | Status: DC

## 2023-09-13 MED ORDER — SUCCINYLCHOLINE CHLORIDE 200 MG/10ML IV SOSY
PREFILLED_SYRINGE | INTRAVENOUS | Status: DC | PRN
Start: 2023-09-13 — End: 2023-09-13
  Administered 2023-09-13: 120 mg via INTRAVENOUS

## 2023-09-13 MED ORDER — VASOPRESSIN 20 UNIT/ML IV SOLN
INTRAVENOUS | Status: AC
  Filled 2023-09-13: qty 1

## 2023-09-13 MED ORDER — LACTATED RINGERS IV BOLUS
1000.0000 mL | Freq: Once | INTRAVENOUS | Status: AC
  Administered 2023-09-13: 1000 mL via INTRAVENOUS

## 2023-09-13 MED ORDER — LACTATED RINGERS IV SOLN: INTRAVENOUS | Status: DC

## 2023-09-13 MED ORDER — OXYCODONE HCL 5 MG PO TABS
5.0000 mg | ORAL_TABLET | ORAL | Status: DC | PRN
  Administered 2023-09-13: 5 mg via ORAL
  Filled 2023-09-13: qty 1

## 2023-09-13 MED ORDER — UMECLIDINIUM BROMIDE 62.5 MCG/ACT IN AEPB
1.0000 | INHALATION_SPRAY | Freq: Every day | RESPIRATORY_TRACT | Status: DC
  Administered 2023-09-14: 1 via RESPIRATORY_TRACT
  Filled 2023-09-13: qty 7

## 2023-09-13 MED ORDER — METRONIDAZOLE 500 MG/100ML IV SOLN
500.0000 mg | Freq: Two times a day (BID) | INTRAVENOUS | Status: DC
  Administered 2023-09-13 – 2023-09-17 (×8): 500 mg via INTRAVENOUS
  Filled 2023-09-13 (×8): qty 100

## 2023-09-13 MED ORDER — ROCURONIUM BROMIDE 100 MG/10ML IV SOLN
INTRAVENOUS | Status: DC | PRN
  Administered 2023-09-13: 50 mg via INTRAVENOUS

## 2023-09-13 MED ORDER — INSULIN ASPART 100 UNIT/ML IJ SOLN
0.0000 [IU] | INTRAMUSCULAR | Status: DC
  Administered 2023-09-13 – 2023-09-14 (×3): 2 [IU] via SUBCUTANEOUS
  Administered 2023-09-15: 3 [IU] via SUBCUTANEOUS
  Administered 2023-09-15: 2 [IU] via SUBCUTANEOUS
  Administered 2023-09-16: 3 [IU] via SUBCUTANEOUS
  Administered 2023-09-16 (×2): 2 [IU] via SUBCUTANEOUS
  Administered 2023-09-18: 3 [IU] via SUBCUTANEOUS
  Administered 2023-09-18 – 2023-09-22 (×6): 2 [IU] via SUBCUTANEOUS
  Filled 2023-09-13: qty 0.15

## 2023-09-13 MED ORDER — FENTANYL CITRATE (PF) 100 MCG/2ML IJ SOLN
INTRAMUSCULAR | Status: DC | PRN
  Administered 2023-09-13: 50 ug via INTRAVENOUS

## 2023-09-13 MED ORDER — ALBUTEROL SULFATE (2.5 MG/3ML) 0.083% IN NEBU
INHALATION_SOLUTION | RESPIRATORY_TRACT | Status: AC
  Filled 2023-09-13: qty 3

## 2023-09-13 MED ORDER — POLYETHYLENE GLYCOL 3350 17 G PO PACK
17.0000 g | PACK | Freq: Every day | ORAL | Status: DC | PRN
Start: 2023-09-13 — End: 2023-09-27

## 2023-09-13 MED ORDER — ALBUTEROL SULFATE (2.5 MG/3ML) 0.083% IN NEBU
2.5000 mg | INHALATION_SOLUTION | Freq: Once | RESPIRATORY_TRACT | Status: DC
Start: 2023-09-13 — End: 2023-09-13

## 2023-09-13 MED ORDER — OXYCODONE-ACETAMINOPHEN 5-325 MG PO TABS
1.0000 | ORAL_TABLET | Freq: Once | ORAL | Status: AC
Start: 2023-09-13 — End: 2023-09-13
  Administered 2023-09-13: 1 via ORAL
  Filled 2023-09-13: qty 1

## 2023-09-13 MED ORDER — SODIUM CHLORIDE 0.9 % IV SOLN
2.0000 g | Freq: Once | INTRAVENOUS | Status: AC
  Administered 2023-09-13: 2 g via INTRAVENOUS
  Filled 2023-09-13: qty 12.5

## 2023-09-13 MED ORDER — ORAL CARE MOUTH RINSE: 15.0000 mL | Freq: Once | OROMUCOSAL | Status: AC

## 2023-09-13 MED ORDER — HYDROMORPHONE HCL 1 MG/ML IJ SOLN
1.0000 mg | INTRAMUSCULAR | Status: DC | PRN
  Administered 2023-09-14 – 2023-09-26 (×23): 1 mg via INTRAVENOUS
  Filled 2023-09-13 (×27): qty 1

## 2023-09-13 MED ORDER — UMECLIDINIUM BROMIDE 62.5 MCG/ACT IN AEPB: 1.0000 | INHALATION_SPRAY | Freq: Every day | RESPIRATORY_TRACT | Status: DC

## 2023-09-13 MED ORDER — OXYCODONE HCL 5 MG PO TABS
10.0000 mg | ORAL_TABLET | ORAL | Status: DC | PRN
  Administered 2023-09-14 – 2023-09-26 (×6): 10 mg via ORAL
  Filled 2023-09-13 (×6): qty 2

## 2023-09-13 MED ORDER — ACETAMINOPHEN 500 MG PO TABS: 1000.0000 mg | ORAL_TABLET | Freq: Once | ORAL | Status: AC

## 2023-09-13 MED ORDER — CLINDAMYCIN PHOSPHATE 600 MG/50ML IV SOLN
600.0000 mg | Freq: Three times a day (TID) | INTRAVENOUS | Status: DC
  Administered 2023-09-13 – 2023-09-14 (×2): 600 mg via INTRAVENOUS
  Filled 2023-09-13 (×2): qty 50

## 2023-09-13 MED ORDER — FLUTICASONE FUROATE-VILANTEROL 100-25 MCG/ACT IN AEPB: 1.0000 | INHALATION_SPRAY | Freq: Every day | RESPIRATORY_TRACT | Status: DC

## 2023-09-13 MED ORDER — VANCOMYCIN HCL 2000 MG/400ML IV SOLN
2000.0000 mg | Freq: Once | INTRAVENOUS | Status: AC
  Administered 2023-09-13: 2000 mg via INTRAVENOUS
  Filled 2023-09-13: qty 400

## 2023-09-13 MED ORDER — CLINDAMYCIN PHOSPHATE 300 MG/50ML IV SOLN
300.0000 mg | Freq: Once | INTRAVENOUS | Status: AC
  Administered 2023-09-13: 300 mg via INTRAVENOUS
  Filled 2023-09-13: qty 50

## 2023-09-13 MED ORDER — SODIUM CHLORIDE 0.9 % IV SOLN: INTRAVENOUS | Status: AC | PRN

## 2023-09-13 MED ORDER — NOREPINEPHRINE 4 MG/250ML-% IV SOLN
2.0000 ug/min | INTRAVENOUS | Status: DC
  Administered 2023-09-14: 2 ug/min via INTRAVENOUS
  Administered 2023-09-15: 6 ug/min via INTRAVENOUS
  Filled 2023-09-13 (×4): qty 250

## 2023-09-13 MED ORDER — PHENYLEPHRINE 80 MCG/ML (10ML) SYRINGE FOR IV PUSH (FOR BLOOD PRESSURE SUPPORT)
PREFILLED_SYRINGE | INTRAVENOUS | Status: AC
  Filled 2023-09-13: qty 10

## 2023-09-13 MED ORDER — SUGAMMADEX SODIUM 200 MG/2ML IV SOLN
INTRAVENOUS | Status: DC | PRN
  Administered 2023-09-13: 200 mg via INTRAVENOUS

## 2023-09-13 MED ORDER — 0.9 % SODIUM CHLORIDE (POUR BTL) OPTIME
TOPICAL | Status: DC | PRN
  Administered 2023-09-13: 1000 mL

## 2023-09-13 MED ORDER — HYDROMORPHONE HCL 1 MG/ML IJ SOLN
1.0000 mg | Freq: Once | INTRAMUSCULAR | Status: AC
  Administered 2023-09-13: 1 mg via INTRAVENOUS

## 2023-09-13 MED ORDER — CHLORHEXIDINE GLUCONATE 0.12 % MT SOLN
15.0000 mL | Freq: Once | OROMUCOSAL | Status: AC
Start: 2023-09-13 — End: 2023-09-13
  Administered 2023-09-13: 15 mL via OROMUCOSAL

## 2023-09-13 MED ORDER — DOCUSATE SODIUM 100 MG PO CAPS: 100.0000 mg | ORAL_CAPSULE | Freq: Two times a day (BID) | ORAL | Status: DC | PRN

## 2023-09-13 MED ORDER — BUPIVACAINE HCL (PF) 0.25 % IJ SOLN
INTRAMUSCULAR | Status: DC | PRN
  Administered 2023-09-13: 10 mL

## 2023-09-13 MED ORDER — ALBUTEROL SULFATE (2.5 MG/3ML) 0.083% IN NEBU
2.5000 mg | INHALATION_SOLUTION | RESPIRATORY_TRACT | Status: AC
  Administered 2023-09-13: 2.5 mg via RESPIRATORY_TRACT

## 2023-09-13 MED ORDER — DEXAMETHASONE SODIUM PHOSPHATE 10 MG/ML IJ SOLN
INTRAMUSCULAR | Status: DC | PRN
  Administered 2023-09-13: 4 mg via INTRAVENOUS

## 2023-09-13 MED ORDER — BUPIVACAINE HCL (PF) 0.25 % IJ SOLN
INTRAMUSCULAR | Status: AC
  Filled 2023-09-13: qty 10

## 2023-09-13 MED ORDER — PROPOFOL 10 MG/ML IV BOLUS
INTRAVENOUS | Status: DC | PRN
  Administered 2023-09-13: 150 mg via INTRAVENOUS

## 2023-09-13 MED ORDER — PROPOFOL 10 MG/ML IV BOLUS
INTRAVENOUS | Status: AC
  Filled 2023-09-13: qty 20

## 2023-09-13 MED ORDER — LIDOCAINE HCL (CARDIAC) PF 100 MG/5ML IV SOSY
PREFILLED_SYRINGE | INTRAVENOUS | Status: DC | PRN
  Administered 2023-09-13: 60 mg via INTRAVENOUS

## 2023-09-13 MED ORDER — SODIUM CHLORIDE 0.9 % IV SOLN
2.0000 g | INTRAVENOUS | Status: DC
  Administered 2023-09-14 – 2023-09-16 (×3): 2 g via INTRAVENOUS
  Filled 2023-09-13 (×3): qty 12.5

## 2023-09-13 MED ORDER — EPHEDRINE 5 MG/ML INJ
INTRAVENOUS | Status: AC
  Filled 2023-09-13: qty 5

## 2023-09-13 MED ORDER — OXYCODONE HCL 5 MG PO TABS
15.0000 mg | ORAL_TABLET | ORAL | Status: DC | PRN
  Administered 2023-09-17 – 2023-09-27 (×19): 15 mg via ORAL
  Filled 2023-09-13 (×20): qty 3

## 2023-09-13 MED ORDER — ALBUMIN HUMAN 5 % IV SOLN: INTRAVENOUS | Status: DC | PRN

## 2023-09-13 MED ORDER — SODIUM CHLORIDE 0.9 % IV SOLN
250.0000 mL | INTRAVENOUS | Status: AC
  Administered 2023-09-13: 250 mL via INTRAVENOUS

## 2023-09-13 MED ORDER — OXYCODONE HCL 5 MG PO TABS: 10.0000 mg | ORAL_TABLET | ORAL | Status: DC | PRN

## 2023-09-13 MED ORDER — EPHEDRINE SULFATE (PRESSORS) 50 MG/ML IJ SOLN
INTRAMUSCULAR | Status: DC | PRN
Start: 2023-09-13 — End: 2023-09-13
  Administered 2023-09-13: 7.5 mg via INTRAVENOUS

## 2023-09-13 MED ORDER — ROCURONIUM BROMIDE 10 MG/ML (PF) SYRINGE
PREFILLED_SYRINGE | INTRAVENOUS | Status: AC
Start: 2023-09-13 — End: ?
  Filled 2023-09-13: qty 10

## 2023-09-13 MED ORDER — HYDROMORPHONE HCL 1 MG/ML IJ SOLN
0.5000 mg | INTRAMUSCULAR | Status: DC | PRN
  Administered 2023-09-13: 0.5 mg via INTRAVENOUS
  Filled 2023-09-13: qty 1

## 2023-09-13 MED ORDER — ONDANSETRON HCL 4 MG/2ML IJ SOLN
INTRAMUSCULAR | Status: DC | PRN
  Administered 2023-09-13: 4 mg via INTRAVENOUS

## 2023-09-13 SURGICAL SUPPLY — 37 items
BAG COUNTER SPONGE SURGICOUNT (BAG) IMPLANT
BENZOIN TINCTURE PRP APPL 2/3 (GAUZE/BANDAGES/DRESSINGS) IMPLANT
BLADE HEX COATED 2.75 (ELECTRODE) ×2 IMPLANT
BLADE SURG 15 STRL LF DISP TIS (BLADE) ×2 IMPLANT
BLADE SURG SZ10 CARB STEEL (BLADE) ×2 IMPLANT
COVER SURGICAL LIGHT HANDLE (MISCELLANEOUS) ×1 IMPLANT
DRAPE LAPAROSCOPIC ABDOMINAL (DRAPES) IMPLANT
DRAPE LAPAROTOMY T 102X78X121 (DRAPES) IMPLANT
DRAPE LAPAROTOMY TRNSV 102X78 (DRAPES) IMPLANT
DRAPE POUCH INSTRU U-SHP 10X18 (DRAPES) ×1 IMPLANT
DRAPE SHEET LG 3/4 BI-LAMINATE (DRAPES) IMPLANT
ELECT REM PT RETURN 15FT ADLT (MISCELLANEOUS) ×2 IMPLANT
EVACUATOR SILICONE 100CC (DRAIN) IMPLANT
GAUZE PACKING IODOFORM 2INX5YD (GAUZE/BANDAGES/DRESSINGS) IMPLANT
GAUZE SPONGE 4X4 12PLY STRL (GAUZE/BANDAGES/DRESSINGS) ×2 IMPLANT
GLOVE BIOGEL PI IND STRL 7.0 (GLOVE) ×1 IMPLANT
GOWN STRL REUS W/ TWL LRG LVL3 (GOWN DISPOSABLE) ×1 IMPLANT
GOWN STRL REUS W/ TWL XL LVL3 (GOWN DISPOSABLE) ×2 IMPLANT
KIT BASIN OR (CUSTOM PROCEDURE TRAY) ×2 IMPLANT
KIT TURNOVER KIT A (KITS) IMPLANT
NDL HYPO 25X1 1.5 SAFETY (NEEDLE) ×2 IMPLANT
NEEDLE HYPO 25X1 1.5 SAFETY (NEEDLE) ×1 IMPLANT
NS IRRIG 1000ML POUR BTL (IV SOLUTION) ×2 IMPLANT
PACK BASIC VI WITH GOWN DISP (CUSTOM PROCEDURE TRAY) ×2 IMPLANT
PENCIL SMOKE EVACUATOR (MISCELLANEOUS) IMPLANT
SHEET LAVH (DRAPES) IMPLANT
SOL PREP POV-IOD 4OZ 10% (MISCELLANEOUS) ×1 IMPLANT
SPIKE FLUID TRANSFER (MISCELLANEOUS) IMPLANT
SPONGE T-LAP 4X18 ~~LOC~~+RFID (SPONGE) ×2 IMPLANT
STAPLER SKIN PROX WIDE 3.9 (STAPLE) IMPLANT
STRIP CLOSURE SKIN 1/2X4 (GAUZE/BANDAGES/DRESSINGS) IMPLANT
SUT MNCRL AB 4-0 PS2 18 (SUTURE) IMPLANT
SUT VIC AB 3-0 SH 18 (SUTURE) IMPLANT
SYR CONTROL 10ML LL (SYRINGE) ×2 IMPLANT
TOWEL OR 17X26 10 PK STRL BLUE (TOWEL DISPOSABLE) ×1 IMPLANT
TRAY FOLEY MTR SLVR 16FR STAT (SET/KITS/TRAYS/PACK) IMPLANT
WATER STERILE IRR 1000ML POUR (IV SOLUTION) ×1 IMPLANT

## 2023-09-13 NOTE — Anesthesia Procedure Notes (Signed)
Procedure Name: Intubation Date/Time: 09/13/2023 6:33 PM  Performed by: Vanessa [place], CRNAPre-anesthesia Checklist: Patient identified, Emergency Drugs available, Suction available and Patient being monitored Patient Re-evaluated:Patient Re-evaluated prior to induction Oxygen Delivery Method: Circle system utilized Preoxygenation: Pre-oxygenation with 100% oxygen Induction Type: IV induction, Rapid sequence and Cricoid Pressure applied Laryngoscope Size: Mac and 4 Grade View: Grade I Tube type: Oral Tube size: 7.5 mm Number of attempts: 1 Airway Equipment and Method: Stylet Placement Confirmation: ETT inserted through vocal cords under direct vision, positive ETCO2 and breath sounds checked- equal and bilateral Secured at: 22 cm Tube secured with: Tape Dental Injury: Teeth and Oropharynx as per pre-operative assessment

## 2023-09-13 NOTE — Op Note (Addendum)
Operative Note  Preoperative diagnosis:  1.  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  Sepsis  Postoperative diagnosis: 1.  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  Sepsis  Procedure(s): 1.  Incision and drainage of perineal, perirectal and scrotal abscess  Surgeon: Rhoderick Moody, MD  Assistants:  None  Anesthesia:  General  Complications:  None  EBL: 50 mL  Specimens: 1.  Tissue culture swabs  Drains/Catheters: 1.  16 French Foley catheter  Intraoperative findings:   Extensive necrosis of the posterior scrotal and perineal skin extending approximately 20 cm vertically and 5 cm horizontally.  The perirectal abscess cavity extended approximately 4 x 4 x 4 cm and was in close proximity to the anus.  Indication:  Evan Brock is a 72 y.o. male with a history of grade 5 prostate cancer, status post gold fiducial marker and SpaceOAR placement on 08/14/2023.  The patient subsequently had a total of 10 IMRT treatments to the prostate.  The patient was initially seen in the emergency department on 09/08/2023 due to worsening perineal pain and general malaise.  The perineal abscess appeared to be present at that time, but he had no intervention.  His symptoms progressively worsened over the subsequent days all the while continuing IMRT.  He had a near syncopal episode today, which prompted another ER visit.  Repeat CT today revealed a large perineal and posterior scrotal abscess with gas infiltrating the soft tissue layers.  The patient has been consented for the above procedures, voices understanding and wishes to proceed.  Description of procedure:  After informed consent was obtained, the patient was brought to the operating room and general endotracheal anesthesia was administered.  The patient was placed in the dorsolithotomy position and prepped and draped in usual sterile fashion.  A timeout was then performed.  A 16 French Foley catheter was then inserted  with return of clear-yellow urine.  The Foley catheter balloon was then inflated with 10 mL of sterile water and placed to gravity drainage.  An incision was made starting in the midline along the posterior aspects of the scrotum extending down to the most caudal region of the perineum.  This revealed extensively necrotic skin and subcutaneous tissue.  Specimen swabs were then inserted into the necrotic tissue and sent for culture.  I then excised the necrotic tissue using electrocautery until healthy, well-vascularized tissue was identified on either side of the incision.  The superficial scrotal and perineal wound measured approximately 20 cm vertically by 5 cm horizontally.  The tissue necrosis extended into the perirectal area and into a cavity measuring approximately 4 x 4 x 4 cm.  This space was bluntly dissected and all necrotic appearing tissue was sharply excised.  There was no evidence of rectal violation or involvement with the anus on examination.  Hemostasis was then achieved using electrocautery.  The wound was then extensively packed with iodoform gauze and dressed with Kerlix fluffs.  The patient tolerated the procedure well and was transferred to the postanesthesia in stable condition.  Plan: Monitor in the ICU.  He will likely need a second look and potentially further debridement with the assistance of general surgery tomorrow.

## 2023-09-13 NOTE — Progress Notes (Signed)
Pharmacy Antibiotic Note  Evan Brock is a 72 y.o. 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  Pharmacy has been consulted for vanc/cefepime/flagyl dosing.  Plan: Patient already received 2g vanc x 1 today, start 1g IV q48 thereafter -goal AUC 400-550 Cefepime 2g IV q24 Flagyl 500mg  IV q12  Height: 5\' 9"  (175.3 cm) Weight: 94.8 kg (209 lb) IBW/kg (Calculated) : 70.7  Temp (24hrs), Avg:98.2 F (36.8 C), Min:97.9 F (36.6 C), Max:98.9 F (37.2 C)  Recent Labs  Lab 09/08/23 0932 09/13/23 1036 09/13/23 1040 09/13/23 1327  WBC 9.2  --  10.2  --   CREATININE 1.19  --  3.72*  --   LATICACIDVEN  --  1.5  --  1.3    Estimated Creatinine Clearance: 20.1 mL/min (A) (by C-G formula based on SCr of 3.72 mg/dL (H)).    No Known Allergies   Thank you for allowing pharmacy to be a part of this patient's care.  Berkley Harvey 09/13/2023 8:18 PM

## 2023-09-13 NOTE — ED Triage Notes (Signed)
Patient reports upon waking this AM he became dizzy and almost had a syncopal event. Denies chest pain and SOB. Patient has ongoing groin pain and lesion that is worsening.

## 2023-09-13 NOTE — Anesthesia Preprocedure Evaluation (Addendum)
Anesthesia Evaluation  Patient identified by MRN, date of birth, ID band Patient awake    Reviewed: Allergy & Precautions, NPO status , Patient's Chart, lab work & pertinent test results, Unable to perform ROS - Chart review only  History of Anesthesia Complications Negative for: history of anesthetic complications  Airway Mallampati: IV  TM Distance: >3 FB Neck ROM: Full    Dental  (+) Partial Upper, Partial Lower   Pulmonary asthma , COPD,  COPD inhaler, former smoker   breath sounds clear to auscultation + decreased breath sounds      Cardiovascular hypertension, Pt. on home beta blockers and Pt. on medications pulmonary hypertension+ CAD, + Past MI and + Cardiac Stents  Normal cardiovascular exam+ Valvular Problems/Murmurs MR   Cath 01/2022 LM: Normal LAD: No significant disease RCA: Prox and distal 30% disease Lcx: Large vessel with prox occlusion with organized heavy thrombus (Culprit)   Successful percutaneous coronary intervention prox Lcx     Aspiration thrombectomy     PTCA and stent placement 4.0 X 16 mm Synergy drug-eluting stent     Post dilatation with 4.0X8 mm [place] balloon up to 22 atm   Echo 01/2022  1. Left ventricular ejection fraction, by estimation, is 60 to 65%. The left ventricle has normal function. The left ventricle has no regional wall motion abnormalities. There is mild left ventricular hypertrophy. Left ventricular diastolic parameters were normal.   2. Right ventricular systolic function is normal. The right ventricular size is normal. There is moderately elevated pulmonary artery systolic pressure.   3. The mitral valve is degenerative. Moderate mitral regurgitation, eccentric jet toward anterior mitral valve leaflet mitral valve regurgitation. No evidence of mitral stenosis.   4. The aortic valve is tricuspid. Aortic valve regurgitation is not visualized. Aortic valve sclerosis is present, with no  evidence of aortic valve stenosis.   5. There is mild dilatation of the ascending aorta, measuring 41 mm.   6. The inferior vena cava is dilated in size with >50% respiratory variability, suggesting right atrial pressure of 8 mmHg.   Conclusion(s)/Recommendation(s): No left ventricular mural or apical thrombus/thrombi.    '23 TTE - EF 60 to 65%. There is mild left ventricular hypertrophy. There is moderately elevated pulmonary artery systolic pressure. Moderate mitral regurgitation, eccentric jet toward anterior mitral valve leaflet mitral valve regurgitation. There is mild dilatation of the ascending aorta, measuring 41 mm.      Neuro/Psych negative neurological ROS  negative psych ROS   GI/Hepatic negative GI ROS, Neg liver ROS,,,  Endo/Other  diabetes, Type 2, Oral Hypoglycemic Agents   Obesity   Renal/GU Renal disease    Prostate cancer     Musculoskeletal  (+) Arthritis , Osteoarthritis,    Abdominal   Peds  Hematology  On brilinta    Anesthesia Other Findings   Reproductive/Obstetrics                             Anesthesia Physical Anesthesia Plan  ASA: 4  Anesthesia Plan: General   Post-op Pain Management: Tylenol PO (pre-op)* and Minimal or no pain anticipated   Induction:   PONV Risk Score and Plan: 4 or greater and Treatment may vary due to age or medical condition, Ondansetron and Dexamethasone  Airway Management Planned: Oral ETT  Additional Equipment: None  Intra-op Plan:   Post-operative Plan: Extubation in OR  Informed Consent: I have reviewed the patients History and Physical, chart, labs and  discussed the procedure including the risks, benefits and alternatives for the proposed anesthesia with the patient or authorized representative who has indicated his/her understanding and acceptance.     Dental advisory given  Plan Discussed with: CRNA  Anesthesia Plan Comments:         Anesthesia Quick  Evaluation

## 2023-09-13 NOTE — ED Provider Notes (Signed)
EMERGENCY DEPARTMENT AT [facility] Provider Note  CSN: [id number] Arrival date & time: 09/13/23 8657  Chief Complaint(s) Near Syncope  HPI Evan Brock is a 72 y.o. male with PMH COPD, HTN, T2DM, prostate cancer who presents to the emergency department for evaluation of lightheadedness and near syncope.  Patient states that he started to feel dizzy last night and attempted to get out of bed this morning and felt very unsteady on his feet with lightheadedness.  Denies associated abdominal pain, nausea, chest pain, shortness of breath, headache, fever or other systemic symptoms.  Patient arrives hypotensive and tachycardic.   Past Medical History Past Medical History:  Diagnosis Date   Anticoagulant long-term use    brilinta/ asa--- managed by cardiology   Benign localized prostatic hyperplasia with lower urinary tract symptoms (LUTS)    Chronic constipation    OTC laxative prn   Chronic pain syndrome    pain management--- dr foster [facility])   neck and back   COPD (chronic obstructive pulmonary disease) (HCC)    followed by pcp ---  not on oxygen (previous seen by pulmonology-- dr byrum--- lov in epic 07-12-2020);   last [facility]2023 [facility]  pneumonia/ hypoxia w/ O2 82% refused ED,  then ED visit 05-17-2023 hypoxia / treated w/ nebs, antibiotics;  back at urgent care 06-06-2023  exacerbation / pneumonia/ +COVID,  O2 90% treated w/ nebs/ prednisone/ antibiotics   Coronary artery disease 01/2022   cardiologist--- dr Valentino Saxon;  NSTEMI  s/p cath 02-16-2022  thrombectomy / PTCA w/ DES to LCX;   DDD (degenerative disc disease), cervical    DDD (degenerative disc disease), lumbosacral    ED (erectile dysfunction)    Exertional shortness of breath    08-08-2023   unable to do stairs or yard word, but can do household chores ok   History of acute respiratory failure    several times w/ hypoxia   History of asbestos exposure    History of non-ST  elevation myocardial infarction (NSTEMI) 02/16/2022   Hyperlipidemia, mixed    Hypertension    Malignant neoplasm prostate [facility]) 12/2022   urologist-- dr pace/  radiation oncologist--- dr Kathrynn Running;   dx 03/ 2024, gleason 4+5   Moderate persistent asthma    OA (osteoarthritis)    S/P drug eluting coronary stent placement 02/17/2023   x1 to LCx   Type 2 diabetes mellitus (HCC)    followed by pcp   (08-08-2023  per pt checks blood sugar one a month)   Wears partial dentures    upper only   Patient Active Problem List   Diagnosis Date Noted   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   Acute hypoxic respiratory failure (HCC) 09/08/2022   Status post coronary artery stent placement    S/P PTCA (percutaneous transluminal coronary angioplasty)    Non-insulin dependent type 2 diabetes mellitus (HCC)    Mixed hyperlipidemia    Coronary atherosclerosis due to calcified coronary lesion    Former smoker    Class 1 obesity due to excess calories with serious comorbidity and body mass index (BMI) of 31.0 to 31.9 in adult    Non-ST elevation (NSTEMI) myocardial infarction (HCC) 02/16/2022   NSTEMI (non-ST elevated myocardial infarction) (HCC) 02/16/2022   Acute respiratory failure with hypoxia (HCC) 11/12/2021   CAP (community acquired pneumonia) 02/16/2021   Hypoxia 02/16/2021   COPD with acute exacerbation (HCC) 02/16/2021   Type 2 diabetes mellitus with obesity (HCC) 02/16/2021   Acute  kidney injury (HCC) 02/16/2021   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   Leukocytosis 10/04/2015   COPD (chronic obstructive pulmonary disease) (HCC) 10/01/2015   Essential hypertension, benign 03/01/2015   Asthma with acute exacerbation 01/26/2015   Thrombocytopenia, unspecified (HCC) 11/06/2013   Acute respiratory failure (HCC) with hypoxia 10/01/2013   Acute renal failure (HCC) 10/01/2013   Home Medication(s) Prior to Admission medications   Medication Sig Start Date End Date Taking?  Authorizing Provider  albuterol (PROVENTIL) (2.5 MG/3ML) 0.083% nebulizer solution Inhale 3 mLs (2.5 mg total) by nebulization every 4 (four) hours as needed for wheezing or shortness of breath. Patient taking differently: Take 2.5 mg by nebulization every 4 (four) hours as needed for wheezing or shortness of breath. Per pt usually does daily in am as prevention 09/11/22   Jerre Simon, MD  albuterol (VENTOLIN HFA) 108 (90 Base) MCG/ACT inhaler INHALE 1 PUFF FOUR TIMES DAILY, AS NEEDED Patient taking differently: Inhale 2 puffs into the lungs every 4 (four) hours as needed for wheezing or shortness of breath. 10/26/21 08/14/23  Raspet, Noberto Retort, PA-C  amoxicillin-clavulanate (AUGMENTIN) 875-125 MG tablet Take 1 tablet by mouth every 12 (twelve) hours. 09/08/23   Barrett, Horald Chestnut, PA-C  Ascorbic Acid (VITAMIN C) 1000 MG tablet Take 2 tablets by mouth daily.    [provider]  aspirin 81 MG EC tablet Take 1 tablet (81 mg total) by mouth daily. Swallow whole. 02/17/22   Patwardhan, Anabel Bene, MD  Cholecalciferol (VITAMIN D3) 1000 units CAPS Take 1 capsule by mouth daily.    [provider]  gabapentin (NEURONTIN) 800 MG tablet Take 800 mg by mouth 3 (three) times daily. 02/01/22   [provider]  HYDROcodone-acetaminophen (NORCO) 10-325 MG tablet Take 1 tablet by mouth every 6 (six) hours as needed.    [provider]  ipratropium-albuterol (DUONEB) 0.5-2.5 (3) MG/3ML SOLN Take 3 mLs by nebulization every 6 (six) hours as needed. Patient not taking: Reported on 08/08/2023 06/06/23   Garrison, [place] N, FNP  losartan (COZAAR) 100 MG tablet Take 1 tablet (100 mg total) by mouth daily. Patient taking differently: Take 100 mg by mouth daily. 05/11/23   Tolia, Sunit, DO  metFORMIN (GLUCOPHAGE) 500 MG tablet Take 1 tablet (500 mg total) by mouth 2 (two) times daily with a meal. Resume on Sunday 02/19/2022 Patient taking differently: Take 500 mg by mouth 2 (two) times daily with a  meal. Resume on Sunday 02/19/2022 02/17/22   Patwardhan, Anabel Bene, MD  metoprolol succinate (TOPROL-XL) 50 MG 24 hr tablet TAKE 1 TABLET(50 MG) BY MOUTH EVERY MORNING WITH OR IMMEDIATELY FOLLOWING A MEAL Patient taking differently: Take 50 mg by mouth daily. 07/10/23   Tolia, Sunit, DO  nitroGLYCERIN (NITROSTAT) 0.4 MG SL tablet PLACE 1 TABLET UNDER THE TONGUE EVERY 5 MINUTES AS NEEDED FOR CHEST PAIN Patient taking differently: Place 0.4 mg under the tongue every 5 (five) minutes as needed for chest pain. 03/01/23   Odis Hollingshead, Sunit, DO  predniSONE (DELTASONE) 50 MG tablet One tablet a day 08/24/23   Cheron Schaumann K, PA-C  rosuvastatin (CRESTOR) 40 MG tablet TAKE 1 TABLET BY MOUTH AT BEDTIME Patient taking differently: Take 40 mg by mouth at bedtime. 07/07/22   Tolia, Sunit, DO  tamsulosin (FLOMAX) 0.4 MG CAPS capsule Take 0.8 mg by mouth at bedtime.    [provider]  ticagrelor (BRILINTA) 90 MG TABS tablet Take 1 tablet (90 mg total) by mouth 2 (two) times daily. 03/16/23 03/15/24  Tolia, Sunit, DO  tiZANidine (ZANAFLEX) 4 MG tablet Take 4 mg by mouth every 8 (eight) hours as needed for muscle spasms. 02/15/23   [provider]  TRELEGY ELLIPTA 100-62.5-25 MCG/ACT AEPB Take 1 puff by mouth daily as needed. Pt not 12/21/21   [provider]                                                                                                                                    Past Surgical History Past Surgical History:  Procedure Laterality Date   CORONARY STENT INTERVENTION N/A 02/16/2022   Procedure: CORONARY STENT INTERVENTION;  Surgeon: Elder Negus, MD;  Location: [facility];  Service: Cardiovascular;  Laterality: N/A;   CORONARY THROMBECTOMY N/A 02/16/2022   Procedure: Coronary Thrombectomy;  Surgeon: Elder Negus, MD;  Location: [facility];  Service: Cardiovascular;  Laterality: N/A;   GOLD SEED IMPLANT N/A 08/14/2023   Procedure: GOLD SEED IMPLANT;   Surgeon: [name], MD;  Location: [facility];  Service: Urology;  Laterality: N/A;  30 MINUTES   LEFT HEART CATH AND CORONARY ANGIOGRAPHY N/A 02/16/2022   Procedure: LEFT HEART CATH AND CORONARY ANGIOGRAPHY;  Surgeon: Elder Negus, MD;  Location: [facility];  Service: Cardiovascular;  Laterality: N/A;   SPACE OAR INSTILLATION N/A 08/14/2023   Procedure: SPACE OAR INSTILLATION;  Surgeon: [name], MD;  Location: [facility];  Service: Urology;  Laterality: N/A;   TOTAL HIP ARTHROPLASTY Right 01/11/2009   @[facility]  by dr Lequita Halt   TOTAL HIP ARTHROPLASTY Left 08/19/2013   Procedure: TOTAL HIP ARTHROPLASTY ANTERIOR APPROACH;  Surgeon: Velna Ochs, MD;  Location: [facility];  Service: Orthopedics;  Laterality: Left;   Family History Family History  Problem Relation Age of Onset   Diabetes Mother    Hypertension Father    Lupus Sister     Social History Social History   Tobacco Use   Smoking status: Former    Types: Cigarettes    Start date: 1992    Quit date: 1972    Years since quitting: 52.9   Smokeless tobacco: Never   Tobacco comments:    08-08-2023  per pt quit smoking 1992,  started age 11,   Vaping Use   Vaping status: Never Used  Substance Use Topics   Alcohol use: Yes    Alcohol/week: 7.0 standard drinks of alcohol    Types: 7 Cans of beer per week    Comment: 08-08-2023  one beer per night ,  16 oz   Drug use: Not Currently    Types: Marijuana    Comment: 10/01/2013 "last drug use was 10 yr ago"   Allergies Patient has no known allergies.  Review of Systems Review of Systems  Neurological:  Positive for light-headedness.    Physical Exam Vital Signs  I have reviewed the triage vital signs  BP (!) 73/61   Pulse (!) 101   Temp 97.9 F (36.6 C) (Oral)   Resp (!) 22   Ht 5\' 9"  (1.753 m)   Wt 94.8 kg   SpO2 98%   BMI 30.86 kg/m   Physical Exam Constitutional:      General: He is not in acute  distress.    Appearance: Normal appearance.  HENT:     Head: Normocephalic and atraumatic.     Nose: No congestion or rhinorrhea.  Eyes:     General:        Right eye: No discharge.        Left eye: No discharge.     Extraocular Movements: Extraocular movements intact.     Pupils: Pupils are equal, round, and reactive to light.  Cardiovascular:     Rate and Rhythm: Normal rate and regular rhythm.     Heart sounds: No murmur heard. Pulmonary:     Effort: No respiratory distress.     Breath sounds: No wheezing or rales.  Abdominal:     General: There is no distension.     Tenderness: There is no abdominal tenderness.  Genitourinary:    Comments: peroneal fluid collection Musculoskeletal:        General: Normal range of motion.     Cervical back: Normal range of motion.  Skin:    General: Skin is warm and dry.  Neurological:     General: No focal deficit present.     Mental Status: He is alert.     ED Results and Treatments Labs (all labs ordered are listed, but only abnormal results are displayed) Labs Reviewed  CBC - Abnormal; Notable for the following components:      Result Value   RBC 3.97 (*)    Hemoglobin 10.4 (*)    HCT 34.4 (*)    RDW 15.7 (*)    All other components within normal limits  COMPREHENSIVE METABOLIC PANEL - Abnormal; Notable for the following components:   Sodium 134 (*)    CO2 18 (*)    Glucose, Bld 151 (*)    BUN 44 (*)    Creatinine, Ser 3.72 (*)    Calcium 8.4 (*)    Total Protein 6.2 (*)    Albumin 2.9 (*)    GFR, Estimated 16 (*)    All other components within normal limits  URINALYSIS, ROUTINE W REFLEX MICROSCOPIC  I-STAT CG4 LACTIC ACID, ED  I-STAT CG4 LACTIC ACID, ED                                                                                                                          Radiology No results found.  Pertinent labs & imaging results that were available during my care of the patient were reviewed by me and  considered in my medical decision making (see MDM for details).  Medications Ordered in ED Medications  oxyCODONE-acetaminophen (PERCOCET/ROXICET) 5-325 MG per tablet 1 tablet (1 tablet  Oral Given 09/13/23 1129)  lactated ringers bolus 1,000 mL (1,000 mLs Intravenous New Bag/Given 09/13/23 1128)                                                                                                                                     Procedures .Critical Care  Performed by: Glendora Score, MD Authorized by: Glendora Score, MD   Critical care provider statement:    Critical care time (minutes):  30   Critical care was necessary to treat or prevent imminent or life-threatening deterioration of the following conditions:  Circulatory failure and shock   Critical care was time spent personally by me on the following activities:  Development of treatment plan with patient or surrogate, discussions with consultants, evaluation of patient's response to treatment, examination of patient, ordering and review of laboratory studies, ordering and review of radiographic studies, ordering and performing treatments and interventions, pulse oximetry, re-evaluation of patient's condition and review of old charts   (including critical care time)  Medical Decision Making / ED Course   This patient presents to the ED for concern of near syncope, groin pain, this involves an extensive number of treatment options, and is a complaint that carries with it a high risk of complications and morbidity.  The differential diagnosis includes dehydration, orthostatic near syncope, abscess, cellulitis, electrolyte abnormality, Fournier's, radiation proctitis  MDM: Patient seen in the emergency room for evaluation of lightheadedness and groin pain.  Physical exam reveals a abscess in the perineum approximately 2 to 3 cm with associated bleeding and purulence.  Patient arrives hypotensive with systolics in the 70s and fluid  resuscitation begun immediately with some improvement.  Met SIRS criteria on arrival and blood cultures obtained.  Laboratory valuation with a hemoglobin of 10.4, new significant AKI with BUN 44, creatinine 3.72, albumin 2.9 but lactic acid is normal.  CT head unremarkable.  CT abdomen pelvis showing subcutaneous emphysema in the groin that may be secondary to radiation or recent instrumentation but cannot rule out Fournier's gangrene.  Broad-spectrum antibiotics with an addition of clindamycin initiated as the patient is persistently hypotensive.  Urology consulted who came to bedside and will prep the patient for surgery today.  Spoke with the ICU attending who agrees to admit the patient to the ICU.  Patient admitted.   Additional history obtained: -Additional history obtained from wife -External records from outside source obtained and reviewed including: Chart review including previous notes, labs, imaging, consultation notes   Lab Tests: -I ordered, reviewed, and interpreted labs.   The pertinent results include:   Labs Reviewed  CBC - Abnormal; Notable for the following components:      Result Value   RBC 3.97 (*)    Hemoglobin 10.4 (*)    HCT 34.4 (*)    RDW 15.7 (*)    All other components within normal limits  COMPREHENSIVE METABOLIC PANEL - Abnormal; Notable for the following  components:   Sodium 134 (*)    CO2 18 (*)    Glucose, Bld 151 (*)    BUN 44 (*)    Creatinine, Ser 3.72 (*)    Calcium 8.4 (*)    Total Protein 6.2 (*)    Albumin 2.9 (*)    GFR, Estimated 16 (*)    All other components within normal limits  URINALYSIS, ROUTINE W REFLEX MICROSCOPIC  I-STAT CG4 LACTIC ACID, ED  I-STAT CG4 LACTIC ACID, ED      EKG   EKG Interpretation Date/Time:  Thursday September 13 2023 11:08:16 EST Ventricular Rate:  97 PR Interval:  173 QRS Duration:  88 QT Interval:  329 QTC Calculation: 418 R Axis:   32  Text Interpretation: Sinus rhythm Confirmed by Azelyn Batie  ([id number]) on 09/13/2023 3:37:08 PM         Imaging Studies ordered: I ordered imaging studies including CT head, CT abdomen pelvis I independently visualized and interpreted imaging. I agree with the radiologist interpretation   Medicines ordered and prescription drug management: Meds ordered this encounter  Medications   oxyCODONE-acetaminophen (PERCOCET/ROXICET) 5-325 MG per tablet 1 tablet   lactated ringers bolus 1,000 mL    -I have reviewed the patients home medicines and have made adjustments as needed  Critical interventions Fluid resuscitation, broad-spectrum antibiotics, emergent urology and ICU consultation  Consultations Obtained: I requested consultation with the urology team on-call and intensivist on-call,  and discussed lab and imaging findings as well as pertinent plan - they recommend: Emergent OR, ICU admission   Cardiac Monitoring: The patient was maintained on a cardiac monitor.  I personally viewed and interpreted the cardiac monitored which showed an underlying rhythm of: NSR, sinus tachycardia  Social Determinants of Health:  Factors impacting patients care include: none   Reevaluation: After the interventions noted above, I reevaluated the patient and found that they have :improved  Co morbidities that complicate the patient evaluation  Past Medical History:  Diagnosis Date   Anticoagulant long-term use    brilinta/ asa--- managed by cardiology   Benign localized prostatic hyperplasia with lower urinary tract symptoms (LUTS)    Chronic constipation    OTC laxative prn   Chronic pain syndrome    pain management--- dr foster [facility])   neck and back   COPD (chronic obstructive pulmonary disease) (HCC)    followed by pcp ---  not on oxygen (previous seen by pulmonology-- dr byrum--- lov in epic 07-12-2020);   last exacerb[facility]  pneumonia/ hypoxia w/ O2 82% refused ED,  then ED visit 05-17-2023 hypoxia / treated w/ nebs,  antibiotics;  back at urgent care 06-06-2023  exacerbation / pneumonia/ +COVID,  O2 90% treated w/ nebs/ prednisone/ antibiotics   Coronary artery disease 01/2022   cardiologist--- dr Valentino Saxon;  NSTEMI  s/p cath 02-16-2022  thrombectomy / PTCA w/ DES to LCX;   DDD (degenerative disc disease), cervical    DDD (degenerative disc disease), lumbosacral    ED (erectile dysfunction)    Exertional shortness of breath    08-08-2023   unable to do stairs or yard word, but can do household chores ok   History of acute respiratory failure    several times w/ hypoxia   History of asbestos exposure    History of non-ST elevation myocardial infarction (NSTEMI) 02/16/2022   Hyperlipidemia, mixed    Hypertension    Malignant neoplasm prostate [facility]) 12/2022   urologist-- dr pace/  radiation oncologist--- dr  manning;   dx 03/ 2024, gleason 4+5   Moderate persistent asthma    OA (osteoarthritis)    S/P drug eluting coronary stent placement 02/17/2023   x1 to LCx   Type 2 diabetes mellitus (HCC)    followed by pcp   (08-08-2023  per pt checks blood sugar one a month)   Wears partial dentures    upper only      Dispostion: I considered admission for this patient, and given possible Fournier's gangrene with persistent hypotension patient require hospital admission     Final Clinical Impression(s) / ED Diagnoses Final diagnoses:  None     @PCDICTATION @    Glendora Score, MD 09/13/23 1537

## 2023-09-13 NOTE — Progress Notes (Addendum)
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  Emergently taken to the OR for debridement.  Vital signs consistent with tachypnea, tachycardia, and hypotension.  Results consistent with metabolic acidosis, mild hyperglycemia, elevated creatinine.  Normocytic anemia.  Extensive pelvic disease noted on CT abdomen.  eICU Interventions  Initiate oxycodone sliding scale, Dilaudid for breakthrough  Switch to n.p.o. except sips for meds  Maintain cefepime and metronidazole. Add clindamycin for antitoxin effect.  Holding home prednisone  Maintain scheduled and as needed SVNs  Norepinephrine as needed to maintain MAP greater than 65  GI prophylaxis with pantoprazole, unclear indication. DVT prophylaxis with heparin   2317 -refractory pain despite oxycodone and Dilaudid.  Still rating the pain 9 out of 10.  Start end-tidal CO2 monitoring.  At baseline, the patient takes hydrocodone 10 mg.  Will increase breakthrough dose, 1 mg dose now.  Increase oxycodone dosing.  If pain is refractory to this, may need to PCA.  7829 - Magnesium 1.8-> magnesium supplementation  Intervention Category Evaluation Type: New Patient Evaluation  Browning [place]wood 09/13/2023, 9:36 PM

## 2023-09-13 NOTE — Consult Note (Signed)
Urology Consult Note   Requesting Attending Physician:  Glendora Score, MD Service Providing Consult: Urology  Consulting Attending: Dr. Liliane Brock   Reason for Consult: Peritoneal gas  HPI: Evan Brock is seen in consultation for reasons noted above at the request of Evan, [name], MD. he presents today for near syncopal event and dizziness.  He was found to be hypotensive on exam. Lactic acid has been normal x 2, patient is normothermic, and white blood cell count is within normal range.  He is known to our office and followed by Evan Brock for high-grade prostate cancer.  Recently the shared decision was made to proceed with radiation treatment considering his broader clinical picture.  He had a SpaceOAR gel placed on 08/14/2023.  He reports progressive swelling and discomfort in the perineal area.  He presented to the emergency department with complaint of swelling under his testicles and perineum x 1 month on 09/08/2023.  Imaging at that time was not actionable and no abscess development was observed.  CT A/P on this visit notes a significant amount of gas surrounding the SpaceOAR gel and into the perineum.  First images did not go well through the thighs and artifact from prosthesis obscured the area in question.  CT #19 and beyond show the area of concern much more clearly.  On exam patient was alert, oriented, and in no distress.  His pain has been relatively well-managed.  Palpation was however difficult.  The perineum had 3 adjoining erythematous areas that were weeping serosanguineous fluid and fluctuant.  ------------------  Assessment:  72 y.o. male with sycope, hypotension, and perineal gas surrounding SpaceOAR gel   Recommendations: #perineal abscess To the OR for incision and drainage with Evan Brock Volume resuscitating and fluid responsive at this point.  If MAP does not remain adequate after next 1L bolus, ED team plans to start vasopressors Broad-spectrum ABX  Case and  plan discussed with Evan Brock.  Past Medical History: Past Medical History:  Diagnosis Date   Anticoagulant long-term use    brilinta/ asa--- managed by cardiology   Benign localized prostatic hyperplasia with lower urinary tract symptoms (LUTS)    Chronic constipation    OTC laxative prn   Chronic pain syndrome    pain management--- [facility])   neck and back   COPD (chronic obstructive pulmonary disease) (HCC)    followed by pcp ---  not on oxygen (previous seen by pulmonology-- Evan byrum--- lov in epic 07-12-2020);   last exacerbation [facility]-2023 [facility]  pneumonia/ hypoxia w/ O2 82% refused ED,  then ED visit 05-17-2023 hypoxia / treated w/ nebs, antibiotics;  back at urgent care 06-06-2023  exacerbation / pneumonia/ +COVID,  O2 90% treated w/ nebs/ prednisone/ antibiotics   Coronary artery disease 01/2022   cardiologist--- Evan Brock;  NSTEMI  s/p cath 02-16-2022  thrombectomy / PTCA w/ DES to LCX;   DDD (degenerative disc disease), cervical    DDD (degenerative disc disease), lumbosacral    ED (erectile dysfunction)    Exertional shortness of breath    08-08-2023   unable to do stairs or yard word, but can do household chores ok   History of acute respiratory failure    several times w/ hypoxia   History of asbestos exposure    History of non-ST elevation myocardial infarction (NSTEMI) 02/16/2022   Hyperlipidemia, mixed    Hypertension    Malignant neoplasm prostate [facility]) 12/2022   urologist-- Evan Brock/  radiation oncologist--- Evan Brock;  dx 03/ 2024, gleason 4+5   Moderate persistent asthma    OA (osteoarthritis)    S/P drug eluting coronary stent placement 02/17/2023   x1 to LCx   Type 2 diabetes mellitus (HCC)    followed by pcp   (08-08-2023  per pt checks blood sugar one a month)   Wears partial dentures    upper only    Past Surgical History:  Past Surgical History:  Procedure Laterality Date   CORONARY STENT INTERVENTION N/A 02/16/2022    Procedure: CORONARY STENT INTERVENTION;  Surgeon: Evan Negus, MD;  Location: [facility];  Service: Cardiovascular;  Laterality: N/A;   CORONARY THROMBECTOMY N/A 02/16/2022   Procedure: Coronary Thrombectomy;  Surgeon: Evan Negus, MD;  Location: [facility];  Service: Cardiovascular;  Laterality: N/A;   GOLD SEED IMPLANT N/A 08/14/2023   Procedure: GOLD SEED IMPLANT;  Surgeon: Evan Christmas, MD;  Location: [facility];  Service: Urology;  Laterality: N/A;  30 MINUTES   LEFT HEART CATH AND CORONARY ANGIOGRAPHY N/A 02/16/2022   Procedure: LEFT HEART CATH AND CORONARY ANGIOGRAPHY;  Surgeon: Evan Negus, MD;  Location: [facility];  Service: Cardiovascular;  Laterality: N/A;   SPACE OAR INSTILLATION N/A 08/14/2023   Procedure: SPACE OAR INSTILLATION;  Surgeon: Evan Christmas, MD;  Location[facility];  Service: Urology;  Laterality: N/A;   TOTAL HIP ARTHROPLASTY Right 01/11/2009   @[facility]  by Evan Evan Brock   TOTAL HIP ARTHROPLASTY Left 08/19/2013   Procedure: TOTAL HIP ARTHROPLASTY ANTERIOR APPROACH;  Surgeon: Evan Ochs, MD;  Location: [facility] OR;  Service: Orthopedics;  Laterality: Left;    Medication: Current Facility-Administered Medications  Medication Dose Route Frequency Provider Last Rate Last Admin   ceFEPIme (MAXIPIME) 2 g in sodium chloride 0.9 % 100 mL IVPB  2 g Intravenous Once Swayne, Mary M, RPH       clindamycin (CLEOCIN) IVPB 300 mg  300 mg Intravenous Once Evan, [name], MD 100 mL/hr at 09/13/23 1322 300 mg at 09/13/23 1322   vancomycin (VANCOREADY) IVPB 2000 mg/400 mL  2,000 mg Intravenous Once Cindi Carbon, [facility]       Current Outpatient Medications  Medication Sig Dispense Refill   albuterol (PROVENTIL) (2.5 MG/3ML) 0.083% nebulizer solution Inhale 3 mLs (2.5 mg total) by nebulization every 4 (four) hours as needed for wheezing or shortness of breath. (Patient taking differently: Take 2.5 mg  by nebulization every 4 (four) hours as needed for wheezing or shortness of breath. Per pt usually does daily in am as prevention) 90 mL 0   albuterol (VENTOLIN HFA) 108 (90 Base) MCG/ACT inhaler INHALE 1 PUFF FOUR TIMES DAILY, AS NEEDED (Patient taking differently: Inhale 2 puffs into the lungs every 4 (four) hours as needed for wheezing or shortness of breath.) 8 g 0   amoxicillin-clavulanate (AUGMENTIN) 875-125 MG tablet Take 1 tablet by mouth every 12 (twelve) hours. 14 tablet 0   Ascorbic Acid (VITAMIN C) 1000 MG tablet Take 2 tablets by mouth daily.     aspirin 81 MG EC tablet Take 1 tablet (81 mg total) by mouth daily. Swallow whole. 30 tablet 1   Cholecalciferol (VITAMIN D3) 1000 units CAPS Take 1 capsule by mouth daily.     gabapentin (NEURONTIN) 800 MG tablet Take 800 mg by mouth 3 (three) times daily.     HYDROcodone-acetaminophen (NORCO) 10-325 MG tablet Take 1 tablet by mouth every 6 (six) hours as needed.  ipratropium-albuterol (DUONEB) 0.5-2.5 (3) MG/3ML SOLN Take 3 mLs by nebulization every 6 (six) hours as needed. (Patient not taking: Reported on 08/08/2023) 360 mL 0   losartan (COZAAR) 100 MG tablet Take 1 tablet (100 mg total) by mouth daily. (Patient taking differently: Take 100 mg by mouth daily.) 90 tablet 3   metFORMIN (GLUCOPHAGE) 500 MG tablet Take 1 tablet (500 mg total) by mouth 2 (two) times daily with a meal. Resume on Sunday 02/19/2022 (Patient taking differently: Take 500 mg by mouth 2 (two) times daily with a meal. Resume on Sunday 02/19/2022) 180 tablet 1   metoprolol succinate (TOPROL-XL) 50 MG 24 hr tablet TAKE 1 TABLET(50 MG) BY MOUTH EVERY MORNING WITH OR IMMEDIATELY FOLLOWING A MEAL (Patient taking differently: Take 50 mg by mouth daily.) 90 tablet 3   nitroGLYCERIN (NITROSTAT) 0.4 MG SL tablet PLACE 1 TABLET UNDER THE TONGUE EVERY 5 MINUTES AS NEEDED FOR CHEST PAIN (Patient taking differently: Place 0.4 mg under the tongue every 5 (five) minutes as needed for chest  pain.) 25 tablet 3   predniSONE (DELTASONE) 50 MG tablet One tablet a day 5 tablet 0   rosuvastatin (CRESTOR) 40 MG tablet TAKE 1 TABLET BY MOUTH AT BEDTIME (Patient taking differently: Take 40 mg by mouth at bedtime.) 90 tablet 0   tamsulosin (FLOMAX) 0.4 MG CAPS capsule Take 0.8 mg by mouth at bedtime.     ticagrelor (BRILINTA) 90 MG TABS tablet Take 1 tablet (90 mg total) by mouth 2 (two) times daily. 180 tablet 3   tiZANidine (ZANAFLEX) 4 MG tablet Take 4 mg by mouth every 8 (eight) hours as needed for muscle spasms.     TRELEGY ELLIPTA 100-62.5-25 MCG/ACT AEPB Take 1 puff by mouth daily as needed. Pt not      Allergies: No Known Allergies  Social History: Social History   Tobacco Use   Smoking status: Former    Types: Cigarettes    Start date: 1992    Quit date: 1972    Years since quitting: 52.9   Smokeless tobacco: Never   Tobacco comments:    08-08-2023  per pt quit smoking 1992,  started age 50,   Vaping Use   Vaping status: Never Used  Substance Use Topics   Alcohol use: Yes    Alcohol/week: 7.0 standard drinks of alcohol    Types: 7 Cans of beer per week    Comment: 08-08-2023  one beer per night ,  16 oz   Drug use: Not Currently    Types: Marijuana    Comment: 10/01/2013 "last drug use was 10 yr ago"    Family History Family History  Problem Relation Age of Onset   Diabetes Mother    Hypertension Father    Lupus Sister     ROS   Objective   Vital signs in last 24 hours: BP (!) 113/55 (BP Location: Left Arm)   Pulse 95   Temp 97.9 F (36.6 C) (Oral)   Resp 16   Ht 5\' 9"  (1.753 m)   Wt 94.8 kg   SpO2 99%   BMI 30.86 kg/m   Physical Exam General: NAD, A&O, resting, appropriate HEENT: [place]/AT Pulmonary: Normal work of breathing Cardiovascular: RRR, no cyanosis Abdomen: Soft, NTTP, nondistended GU: Swollen perineum with 3 abutting areas of erythema weeping serosanguineous fluid. Neuro: Appropriate, no focal neurological deficits  Most Recent  Labs: Lab Results  Component Value Date   WBC 10.2 09/13/2023   HGB 10.4 (L) 09/13/2023  HCT 34.4 (L) 09/13/2023   PLT 164 09/13/2023    Lab Results  Component Value Date   NA 134 (L) 09/13/2023   K 4.0 09/13/2023   CL 105 09/13/2023   CO2 18 (L) 09/13/2023   BUN 44 (H) 09/13/2023   CREATININE 3.72 (H) 09/13/2023   CALCIUM 8.4 (L) 09/13/2023   MG 2.1 09/09/2022    Lab Results  Component Value Date   INR 1.06 10/01/2013   APTT 26 08/12/2013     Urine Culture: @[id number] (laburin,org,[id number])@   IMAGING: CT ABDOMEN PELVIS WO CONTRAST  Result Date: 09/13/2023 CLINICAL DATA:  History of prostate cancer with near syncopal episode. Ongoing groin pain and worsening suspected perianal abscess/fistula. * Tracking Code: BO * EXAM: CT ABDOMEN AND PELVIS WITHOUT CONTRAST TECHNIQUE: Multidetector CT imaging of the abdomen and pelvis was performed following the standard protocol without IV contrast. RADIATION DOSE REDUCTION: This exam was performed according to the departmental dose-optimization program which includes automated exposure control, adjustment of the mA and/or kV according to patient size and/or use of iterative reconstruction technique. COMPARISON:  CT abdomen and pelvis dated 09/08/2023 and multiple priors dating back to 06/10/2013 FINDINGS: Lower chest: Similar mild bilateral bilateral lower lobe, middle lobe bronchiectasis with scattered tree-in-bud nodules and subsegmental mucous plugging. No pleural effusion or pneumothorax demonstrated. Partially imaged heart size is normal. Hepatobiliary: No focal hepatic lesions. No intra or extrahepatic biliary ductal dilation. Normal gallbladder. Pancreas: No focal lesions or main ductal dilation. Spleen: Normal in size without focal abnormality. Adrenals/Urinary Tract: No adrenal nodules. No suspicious renal mass on this noncontrast enhanced examination, calculi or hydronephrosis. Suboptimal evaluation of the bladder due to beam  hardening artifact from hip arthroplasties. Stomach/Bowel: Normal appearance of the stomach. No evidence of bowel wall thickening, distention, or inflammatory changes. Colonic diverticulosis without acute diverticulitis. Normal appendix. Vascular/Lymphatic: Aortic atherosclerosis. No enlarged abdominal or pelvic lymph nodes. Reproductive: Suboptimal evaluation of the prostate due to beam hardening artifact from hip arthroplasties. No gross abnormality on the [facility] sequence. Prostate fiducials in-situ. Heterogeneous hyperdensity posterior to the prostate likely corresponds to Hydrogel perirectal spacer material. Other: No free fluid, fluid collection, or free air. Non FDG-avid cystic focus within the posterior mesentery measures 3.6 x 2.8 cm (4:43) closely associated with the proximal duodenum. In retrospect, this structure has been present since at least 2014 but slowly increasing in size in the interval. Musculoskeletal: No acute or abnormal lytic or blastic osseous lesions. Bilateral hip arthroplasties. Small paraumbilical Richter hernia. Multilevel degenerative changes of the partially imaged thoracic and lumbar spine. Interval development of subcutaneous emphysema at the site of previously noted perianal fluid collection with anterior and inferior extension of gas into the perineum and posterior scrotum. IMPRESSION: 1. Interval development of subcutaneous emphysema at the site of previously noted perianal fluid collection with anterior and inferior extension of gas into the perineum and posterior scrotum, which may reflect sequela of radiation therapy, however Fournier gangrene is not excluded. 2. Heterogeneous hypodensity within the SpaceOAR Hydrogel material, which may reflect iatrogenic gas introduced at the time of injection, however superimposed infection can not be excluded. 3. Similar mild bilateral lower lobe, middle lobe bronchiectasis with scattered tree-in-bud nodules and subsegmental mucous plugging,  likely due to chronic atypical infection such as Mycobacterium avium complex. 4. Non FDG-avid cystic focus within the posterior mesentery measures 3.6 x 2.8 cm, closely associated with the proximal duodenum. In retrospect, this structure has been present since at least 2014 but slowly increasing in size in the interval.  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 5.  Aortic Atherosclerosis (ICD10-I70.0). These results were called by telephone at the time of interpretation on 09/13/2023 at 1:03 pm to provider [facility] , who verbally acknowledged these results. Electronically Signed   By: Agustin Cree M.D.   On: 09/13/2023 13:03   CT Head Wo Contrast  Result Date: 09/13/2023 CLINICAL DATA:  Syncope.  Dizziness. EXAM: CT HEAD WITHOUT CONTRAST TECHNIQUE: Contiguous axial images were obtained from the base of the skull through the vertex without intravenous contrast. RADIATION DOSE REDUCTION: This exam was performed according to the departmental dose-optimization program which includes automated exposure control, adjustment of the mA and/or kV according to patient size and/or use of iterative reconstruction technique. COMPARISON:  Head CT 02/16/2021 FINDINGS: Brain: There is no evidence of an acute infarct, intracranial hemorrhage, mass, midline shift, or extra-axial fluid collection. The ventricles and sulci are normal. Vascular: No hyperdense vessel or unexpected calcification. Skull: No acute fracture or suspicious osseous lesion. Sinuses/Orbits: Partially visualized fluid in the left maxillary sinus, also present on the prior CT with chronically opacified left maxillary sinus ostium and infundibulum. Clear mastoid air cells. Unremarkable orbits. Other: None. IMPRESSION: No evidence of acute intracranial abnormality. Electronically Signed   By: Sebastian Ache M.D.   On: 09/13/2023 12:23    ------  Elmon Kirschner, NP Pager: [phone]   Please contact the  urology consult pager with any further questions/concerns.

## 2023-09-13 NOTE — H&P (Signed)
NAME:  Evan Brock, MRN:  [id number], DOB:  May 20, 1951, LOS: 0 ADMISSION DATE:  09/13/2023, CONSULTATION DATE:  11/21  REFERRING MD:   CHIEF COMPLAINT:  Near Syncope   History of Present Illness:  Pt is a 72 yr old male with pmh of HTN, CAD (on Brilinta), Type 2 DM (Metformin), COPD, bronchiectasis, high grade prostate cancer with recent Gold seed implant and SpaceOAR gel placement for radiation treatment on 08/14/23, asbestos exposure/chemical exposure by working in [employer] for a total of 43yrs, former smoker tobacco for a total of 8 yrs (half a pack a day), and recently quit smoking marijuana over 2 years ago.  Patient originally presented to [facility] ED on 11/16 for testicular/perianal pain. At that time, patient had been endorsing that this pain and discomfort had been ongoing for at least 2 weeks. ED workup included a CT scan of abdomen/pelvis possible perianal abnormality without presence of abscess. Patient case was discussed with general surgery who recommended outpatient antibiotics with follow-up.  Patient returned to [facility] on 11/21 with near syncopal event. Per ED report patient began feeling dizziness and lightheaded on 11/20 and upon waking this morning felt lightheaded/dizzy. During the initial ED workup, patient presented with tachycardia, and hypotension with systolic BP within the low 70s, which was responsive with 2 LR fluid boluses. Initial lab work showed AKI with creatinine 2.72 and BUN 44, hgb 10.4, lactic acid within normal limits. Patient also was able to endorse that his perineum had a greater amount of swelling and pain. Upon assessment of perineum, there was an abscess measuring 2-3cm with purulent and serosanguineous fluid. 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 PCCM consulted for management of septic shock.   Pertinent  Medical History   has a past medical history of  Anticoagulant long-term use, Benign localized prostatic hyperplasia with lower urinary tract symptoms (LUTS), Chronic constipation, Chronic pain syndrome, COPD (chronic obstructive pulmonary disease) (HCC), Coronary artery disease (01/2022), DDD (degenerative disc disease), cervical, DDD (degenerative disc disease), lumbosacral, ED (erectile dysfunction), Exertional shortness of breath, History of acute respiratory failure, History of asbestos exposure, History of non-ST elevation myocardial infarction (NSTEMI) (02/16/2022), Hyperlipidemia, mixed, Hypertension, Malignant neoplasm prostate (HCC) (12/2022), Moderate persistent asthma, OA (osteoarthritis), S/P drug eluting coronary stent placement (02/17/2023), Type 2 diabetes mellitus (HCC), and Wears partial dentures.   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 rate 20, height 5\' 9"  (1.753 m), weight 94.8 kg, SpO2 92%.        Intake/Output Summary (Last 24 hours) at 09/13/2023 1557 Last data filed at 09/13/2023 1519 Gross per 24 hour  Intake 2150 ml  Output --  Net 2150 ml   Filed Weights   09/13/23 1015  Weight: 94.8 kg    Examination: General: alert appearing older male, no acute distress  HENT: Normocephalic, pink moist mucous membranes  Lungs: Clear throughout all lung bases, RR WNL  Cardiovascular: s1, s2, RRR, sinus tach, no MRG Abdomen: distended, obese, soft, BS active  Extremities: movement of all extremities with ease Neuro: Alert and oriented x 4, follows all commands  GU: perineal abscess posteriorly on  testes, serosanguineous/purulent fluid  Painful and tender  upon palpation, crepitus present     Resolved Hospital Problem list   N/a   Assessment & Plan:  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  2-3cm abscess on perineum with purulent serosanguineous fluid CT scan of abdomen indicated  subcutaneous emphysema within the perineum suggesting fourneir's gangrene vs radiation exposure/instrumentation  Urology following recommending Incision and drainage May need additional fluid resuscitation and vasopressor support after surgery  Receive 2-3Liters of LR, no pressors needed at this point  Lactic acids x2 WNL WBC 10.2  P:  Admit patient to ICU for frequent BP monitoring  MAP goal >65  BC pending, follow up  Urinalysis ordered, needs to be collected  Initiate maintenance LR at 180ml/hr  Continue to trend lactic acids, along with WBC  Continue Vanc and Cefepime   AKI  Secondary to septic shock  Cr 3.72 BUN 44 P: Initiate maintenance fluids as above for renal perfusion  Follow renal function panel, along with electrolytes  Avoid nephrotoxic agents   COPD  Bronchiectasis  On Trelegy/ Duonebs at home Patient in no respiratory distress,  Not on oxygen  P:  Continue Duonebs  Ordered Breo Elipta and Incruse  HTN Hold antihypertensives for now   DM2  On metformin  P:  Holding metformin  SSI ordered CBG q 4 NPO   Prostate Cancer  P:  Continue to follow outpatient   Best Practice (right click and "Reselect all SmartList Selections" daily)   Diet/type: NPO DVT prophylaxis: heparin injection 5,000 Units Start: 09/13/23 2200   Pressure ulcer(s): not present on admission  GI prophylaxis: PPI Lines: N/A Foley:  N/A Code Status:  full code  Last date of multidisciplinary goals of care discussion [ updated patient and family with MD Everardo All on 11/21]   Labs   CBC: Recent Labs  Lab 09/08/23 0932 09/13/23 1040  WBC 9.2 10.2  NEUTROABS 7.9*  --   HGB 11.6* 10.4*  HCT 37.9* 34.4*  MCV 86.7 86.6  PLT 202 164     Basic Metabolic Panel: Recent Labs  Lab 09/08/23 0932 09/13/23 1040  NA 135 134*  K 4.3 4.0  CL 101 105  CO2 28 18*  GLUCOSE 110* 151*  BUN 21 44*  CREATININE 1.19 3.72*  CALCIUM 9.5 8.4*   GFR: Estimated Creatinine Clearance: 20.1 mL/min (A) (by C-G formula based on SCr of 3.72 mg/dL (H)). Recent Labs  Lab 09/08/23 0932 09/13/23 1036 09/13/23 1040 09/13/23 1327  WBC 9.2  --  10.2  --   LATICACIDVEN  --  1.5  --  1.3    Liver Function Tests: Recent Labs  Lab 09/13/23 1040  AST 16  ALT 19  ALKPHOS 54  BILITOT 0.4  PROT 6.2*  ALBUMIN 2.9*   No results for input(s): "LIPASE", "AMYLASE" in the last 168 hours. No results for input(s): "AMMONIA" in the last 168 hours.  ABG    Component Value Date/Time   PHART 7.300 (L) 10/02/2015 0141   PCO2ART 45.1 (H) 10/02/2015 0141   PO2ART 44.0 (L) 10/02/2015 0141   HCO3 32.9 (H) 08/25/2021 1258   TCO2 34 (H) 08/14/2023 0944   ACIDBASEDEF 4.0 (H) 10/02/2015 0141   O2SAT 27.0 08/25/2021 1258     Coagulation Profile: No results for input(s): "INR", "PROTIME" in the last 168 hours.  Cardiac Enzymes: No results for input(s): "CKTOTAL", "CKMB", "CKMBINDEX", "TROPONINI" in the last 168 hours.  HbA1C: Hgb A1c MFr Bld  Date/Time Value Ref Range  Status  09/08/2022 02:20 PM 6.9 (H) 4.8 - 5.6 % Final    Comment:    (NOTE) Pre diabetes:          5.7%-6.4%  Diabetes:              >6.4%  Glycemic control for   <7.0% adults with diabetes   02/17/2022 12:40 AM 6.5 (H) 4.8 - 5.6 % Final    Comment:    (NOTE) Pre diabetes:          5.7%-6.4%  Diabetes:              >6.4%  Glycemic control for   <7.0% adults with diabetes     CBG: No results for input(s): "GLUCAP" in the last 168 hours.  Review of Systems:   All other systems negative   Past Medical History:  He,  has a past medical history of Anticoagulant long-term use, Benign localized prostatic hyperplasia with lower urinary tract symptoms (LUTS),  Chronic constipation, Chronic pain syndrome, COPD (chronic obstructive pulmonary disease) (HCC), Coronary artery disease (01/2022), DDD (degenerative disc disease), cervical, DDD (degenerative disc disease), lumbosacral, ED (erectile dysfunction), Exertional shortness of breath, History of acute respiratory failure, History of asbestos exposure, History of non-ST elevation myocardial infarction (NSTEMI) (02/16/2022), Hyperlipidemia, mixed, Hypertension, Malignant neoplasm prostate (HCC) (12/2022), Moderate persistent asthma, OA (osteoarthritis), S/P drug eluting coronary stent placement (02/17/2023), Type 2 diabetes mellitus (HCC), and Wears partial dentures.   Surgical History:   Past Surgical History:  Procedure Laterality Date   CORONARY STENT INTERVENTION N/A 02/16/2022   Procedure: CORONARY STENT INTERVENTION;  Surgeon: Elder Negus, MD;  Location: [facility];  Service: Cardiovascular;  Laterality: N/A;   CORONARY THROMBECTOMY N/A 02/16/2022   Procedure: Coronary Thrombectomy;  Surgeon: Elder Negus, MD;  Location: [facility];  Service: Cardiovascular;  Laterality: N/A;   GOLD SEED IMPLANT N/A 08/14/2023   Procedure: GOLD SEED IMPLANT;  Surgeon: [name], MD;  Location: [facility];  Service: Urology;  Laterality: N/A;  30 MINUTES   LEFT HEART CATH AND CORONARY ANGIOGRAPHY N/A 02/16/2022   Procedure: LEFT HEART CATH AND CORONARY ANGIOGRAPHY;  Surgeon: Elder Negus, MD;  Location: [facility];  Service: Cardiovascular;  Laterality: N/A;   SPACE OAR INSTILLATION N/A 08/14/2023   Procedure: SPACE OAR INSTILLATION;  Surgeon: [name], MD;  Location: [facility];  Service: Urology;  Laterality: N/A;   TOTAL HIP ARTHROPLASTY Right 01/11/2009   @[facility]  by dr Lequita Halt   TOTAL HIP ARTHROPLASTY Left 08/19/2013   Procedure: TOTAL HIP ARTHROPLASTY ANTERIOR APPROACH;  Surgeon: Velna Ochs, MD;  Location: [facility] OR;   Service: Orthopedics;  Laterality: Left;     Social History:   reports that he quit smoking about 52 years ago. His smoking use included cigarettes. He started smoking about 32 years ago. He has never used smokeless tobacco. He reports current alcohol use of about 7.0 standard drinks of alcohol per week. He reports that he does not currently use drugs after having used the following drugs: Marijuana.   Family History:  His family history includes Diabetes in his mother; Hypertension in his father; Lupus in his sister.   Allergies No Known Allergies   Home Medications  Prior to Admission medications   Medication Sig Start Date End Date Taking? Authorizing Provider  albuterol (PROVENTIL) (2.5 MG/3ML) 0.083% nebulizer solution Inhale 3 mLs (2.5 mg total) by nebulization every 4 (four) hours as  needed for wheezing or shortness of breath. Patient taking differently: Take 2.5 mg by nebulization every 4 (four) hours as needed for wheezing or shortness of breath. Per pt usually does daily in am as prevention 09/11/22   Jerre Simon, MD  albuterol (VENTOLIN HFA) 108 (90 Base) MCG/ACT inhaler INHALE 1 PUFF FOUR TIMES DAILY, AS NEEDED Patient taking differently: Inhale 2 puffs into the lungs every 4 (four) hours as needed for wheezing or shortness of breath. 10/26/21 08/14/23  Raspet, Noberto Retort, PA-C  amoxicillin-clavulanate (AUGMENTIN) 875-125 MG tablet Take 1 tablet by mouth every 12 (twelve) hours. 09/08/23   Barrett, Horald Chestnut, PA-C  Ascorbic Acid (VITAMIN C) 1000 MG tablet Take 2 tablets by mouth daily.    [provider]  aspirin 81 MG EC tablet Take 1 tablet (81 mg total) by mouth daily. Swallow whole. 02/17/22   Patwardhan, Anabel Bene, MD  Cholecalciferol (VITAMIN D3) 1000 units CAPS Take 1 capsule by mouth daily.    [provider]  gabapentin (NEURONTIN) 800 MG tablet Take 800 mg by mouth 3 (three) times daily. 02/01/22   [provider]  HYDROcodone-acetaminophen (NORCO)  10-325 MG tablet Take 1 tablet by mouth every 6 (six) hours as needed.    [provider]  ipratropium-albuterol (DUONEB) 0.5-2.5 (3) MG/3ML SOLN Take 3 mLs by nebulization every 6 (six) hours as needed. Patient not taking: Reported on 08/08/2023 06/06/23   Garrison, [place] N, FNP  losartan (COZAAR) 100 MG tablet Take 1 tablet (100 mg total) by mouth daily. Patient taking differently: Take 100 mg by mouth daily. 05/11/23   Tolia, Sunit, DO  metFORMIN (GLUCOPHAGE) 500 MG tablet Take 1 tablet (500 mg total) by mouth 2 (two) times daily with a meal. Resume on Sunday 02/19/2022 Patient taking differently: Take 500 mg by mouth 2 (two) times daily with a meal. Resume on Sunday 02/19/2022 02/17/22   Patwardhan, Anabel Bene, MD  metoprolol succinate (TOPROL-XL) 50 MG 24 hr tablet TAKE 1 TABLET(50 MG) BY MOUTH EVERY MORNING WITH OR IMMEDIATELY FOLLOWING A MEAL Patient taking differently: Take 50 mg by mouth daily. 07/10/23   Tolia, Sunit, DO  nitroGLYCERIN (NITROSTAT) 0.4 MG SL tablet PLACE 1 TABLET UNDER THE TONGUE EVERY 5 MINUTES AS NEEDED FOR CHEST PAIN Patient taking differently: Place 0.4 mg under the tongue every 5 (five) minutes as needed for chest pain. 03/01/23   Odis Hollingshead, Sunit, DO  predniSONE (DELTASONE) 50 MG tablet One tablet a day 08/24/23   Cheron Schaumann K, PA-C  rosuvastatin (CRESTOR) 40 MG tablet TAKE 1 TABLET BY MOUTH AT BEDTIME Patient taking differently: Take 40 mg by mouth at bedtime. 07/07/22   Tolia, Sunit, DO  tamsulosin (FLOMAX) 0.4 MG CAPS capsule Take 0.8 mg by mouth at bedtime.    [provider]  ticagrelor (BRILINTA) 90 MG TABS tablet Take 1 tablet (90 mg total) by mouth 2 (two) times daily. 03/16/23 03/15/24  Tolia, Sunit, DO  tiZANidine (ZANAFLEX) 4 MG tablet Take 4 mg by mouth every 8 (eight) hours as needed for muscle spasms. 02/15/23   [provider]  TRELEGY ELLIPTA 100-62.5-25 MCG/ACT AEPB Take 1 puff by mouth daily as needed. Pt not 12/21/21   [provider]     Critical care time: 45 mins     Orson Gear AGACNP-BC

## 2023-09-13 NOTE — H&P (View-Only) (Signed)
 Urology Consult Note   Requesting Attending Physician:  Glendora Score, MD Service Providing Consult: Urology  Consulting Attending: [name]   Reason for Consult: Peritoneal gas  HPI: Evan Brock is seen in consultation for reasons noted above at the request of [name], [name], MD. he presents today for near syncopal event and dizziness.  He was found to be hypotensive on exam. Lactic acid has been normal x 2, patient is normothermic, and white blood cell count is within normal range.  He is known to our office and followed by [name] for high-grade prostate cancer.  Recently the shared decision was made to proceed with radiation treatment considering his broader clinical picture.  He had a SpaceOAR gel placed on 08/14/2023.  He reports progressive swelling and discomfort in the perineal area.  He presented to the emergency department with complaint of swelling under his testicles and perineum x 1 month on 09/08/2023.  Imaging at that time was not actionable and no abscess development was observed.  CT A/P on this visit notes a significant amount of gas surrounding the SpaceOAR gel and into the perineum.  First images did not go well through the thighs and artifact from prosthesis obscured the area in question.  CT #19 and beyond show the area of concern much more clearly.  On exam patient was alert, oriented, and in no distress.  His pain has been relatively well-managed.  Palpation was however difficult.  The perineum had 3 adjoining erythematous areas that were weeping serosanguineous fluid and fluctuant.  ------------------  Assessment:  72 y.o. male with sycope, hypotension, and perineal gas surrounding SpaceOAR gel   Recommendations: #perineal abscess To the OR for incision and drainage with [name] Volume resuscitating and fluid responsive at this point.  If MAP does not remain adequate after next 1L bolus, ED team plans to start vasopressors Broad-spectrum ABX  Case and  plan discussed with [name].  Past Medical History: Past Medical History:  Diagnosis Date   Anticoagulant long-term use    brilinta/ asa--- managed by cardiology   Benign localized prostatic hyperplasia with lower urinary tract symptoms (LUTS)    Chronic constipation    OTC laxative prn   Chronic pain syndrome    pain management--- dr foster [facility])   neck and back   COPD (chronic obstructive pulmonary disease) (HCC)    followed by pcp ---  not on oxygen (previous seen by pulmonology-- dr byrum--- lov in epic 07-12-2020);   last exacerbation [facility]-2023 [facility]  pneumonia/ hypoxia w/ O2 82% refused ED,  then ED visit 05-17-2023 hypoxia / treated w/ nebs, antibiotics;  back at urgent care 06-06-2023  exacerbation / pneumonia/ +COVID,  O2 90% treated w/ nebs/ prednisone/ antibiotics   Coronary artery disease 01/2022   cardiologist--- dr Valentino Saxon;  NSTEMI  s/p cath 02-16-2022  thrombectomy / PTCA w/ DES to LCX;   DDD (degenerative disc disease), cervical    DDD (degenerative disc disease), lumbosacral    ED (erectile dysfunction)    Exertional shortness of breath    08-08-2023   unable to do stairs or yard word, but can do household chores ok   History of acute respiratory failure    several times w/ hypoxia   History of asbestos exposure    History of non-ST elevation myocardial infarction (NSTEMI) 02/16/2022   Hyperlipidemia, mixed    Hypertension    Malignant neoplasm prostate [facility]) 12/2022   urologist-- dr pace/  radiation oncologist--- dr Kathrynn Running;  dx 03/ 2024, gleason 4+5   Moderate persistent asthma    OA (osteoarthritis)    S/P drug eluting coronary stent placement 02/17/2023   x1 to LCx   Type 2 diabetes mellitus (HCC)    followed by pcp   (08-08-2023  per pt checks blood sugar one a month)   Wears partial dentures    upper only    Past Surgical History:  Past Surgical History:  Procedure Laterality Date   CORONARY STENT INTERVENTION N/A 02/16/2022    Procedure: CORONARY STENT INTERVENTION;  Surgeon: Elder Negus, MD;  Location: [facility];  Service: Cardiovascular;  Laterality: N/A;   CORONARY THROMBECTOMY N/A 02/16/2022   Procedure: Coronary Thrombectomy;  Surgeon: Elder Negus, MD;  Location: [facility];  Service: Cardiovascular;  Laterality: N/A;   GOLD SEED IMPLANT N/A 08/14/2023   Procedure: GOLD SEED IMPLANT;  Surgeon: [name], MD;  Location: [facility];  Service: Urology;  Laterality: N/A;  30 MINUTES   LEFT HEART CATH AND CORONARY ANGIOGRAPHY N/A 02/16/2022   Procedure: LEFT HEART CATH AND CORONARY ANGIOGRAPHY;  Surgeon: Elder Negus, MD;  Location: [facility];  Service: Cardiovascular;  Laterality: N/A;   SPACE OAR INSTILLATION N/A 08/14/2023   Procedure: SPACE OAR INSTILLATION;  Surgeon: [name], MD;  Location[facility];  Service: Urology;  Laterality: N/A;   TOTAL HIP ARTHROPLASTY Right 01/11/2009   @[facility]  by dr Lequita Halt   TOTAL HIP ARTHROPLASTY Left 08/19/2013   Procedure: TOTAL HIP ARTHROPLASTY ANTERIOR APPROACH;  Surgeon: Velna Ochs, MD;  Location: [facility] OR;  Service: Orthopedics;  Laterality: Left;    Medication: Current Facility-Administered Medications  Medication Dose Route Frequency Provider Last Rate Last Admin   ceFEPIme (MAXIPIME) 2 g in sodium chloride 0.9 % 100 mL IVPB  2 g Intravenous Once Swayne, Mary M, RPH       clindamycin (CLEOCIN) IVPB 300 mg  300 mg Intravenous Once [name], [name], MD 100 mL/hr at 09/13/23 1322 300 mg at 09/13/23 1322   vancomycin (VANCOREADY) IVPB 2000 mg/400 mL  2,000 mg Intravenous Once Cindi Carbon, [facility]       Current Outpatient Medications  Medication Sig Dispense Refill   albuterol (PROVENTIL) (2.5 MG/3ML) 0.083% nebulizer solution Inhale 3 mLs (2.5 mg total) by nebulization every 4 (four) hours as needed for wheezing or shortness of breath. (Patient taking differently: Take 2.5 mg  by nebulization every 4 (four) hours as needed for wheezing or shortness of breath. Per pt usually does daily in am as prevention) 90 mL 0   albuterol (VENTOLIN HFA) 108 (90 Base) MCG/ACT inhaler INHALE 1 PUFF FOUR TIMES DAILY, AS NEEDED (Patient taking differently: Inhale 2 puffs into the lungs every 4 (four) hours as needed for wheezing or shortness of breath.) 8 g 0   amoxicillin-clavulanate (AUGMENTIN) 875-125 MG tablet Take 1 tablet by mouth every 12 (twelve) hours. 14 tablet 0   Ascorbic Acid (VITAMIN C) 1000 MG tablet Take 2 tablets by mouth daily.     aspirin 81 MG EC tablet Take 1 tablet (81 mg total) by mouth daily. Swallow whole. 30 tablet 1   Cholecalciferol (VITAMIN D3) 1000 units CAPS Take 1 capsule by mouth daily.     gabapentin (NEURONTIN) 800 MG tablet Take 800 mg by mouth 3 (three) times daily.     HYDROcodone-acetaminophen (NORCO) 10-325 MG tablet Take 1 tablet by mouth every 6 (six) hours as needed.  ipratropium-albuterol (DUONEB) 0.5-2.5 (3) MG/3ML SOLN Take 3 mLs by nebulization every 6 (six) hours as needed. (Patient not taking: Reported on 08/08/2023) 360 mL 0   losartan (COZAAR) 100 MG tablet Take 1 tablet (100 mg total) by mouth daily. (Patient taking differently: Take 100 mg by mouth daily.) 90 tablet 3   metFORMIN (GLUCOPHAGE) 500 MG tablet Take 1 tablet (500 mg total) by mouth 2 (two) times daily with a meal. Resume on Sunday 02/19/2022 (Patient taking differently: Take 500 mg by mouth 2 (two) times daily with a meal. Resume on Sunday 02/19/2022) 180 tablet 1   metoprolol succinate (TOPROL-XL) 50 MG 24 hr tablet TAKE 1 TABLET(50 MG) BY MOUTH EVERY MORNING WITH OR IMMEDIATELY FOLLOWING A MEAL (Patient taking differently: Take 50 mg by mouth daily.) 90 tablet 3   nitroGLYCERIN (NITROSTAT) 0.4 MG SL tablet PLACE 1 TABLET UNDER THE TONGUE EVERY 5 MINUTES AS NEEDED FOR CHEST PAIN (Patient taking differently: Place 0.4 mg under the tongue every 5 (five) minutes as needed for chest  pain.) 25 tablet 3   predniSONE (DELTASONE) 50 MG tablet One tablet a day 5 tablet 0   rosuvastatin (CRESTOR) 40 MG tablet TAKE 1 TABLET BY MOUTH AT BEDTIME (Patient taking differently: Take 40 mg by mouth at bedtime.) 90 tablet 0   tamsulosin (FLOMAX) 0.4 MG CAPS capsule Take 0.8 mg by mouth at bedtime.     ticagrelor (BRILINTA) 90 MG TABS tablet Take 1 tablet (90 mg total) by mouth 2 (two) times daily. 180 tablet 3   tiZANidine (ZANAFLEX) 4 MG tablet Take 4 mg by mouth every 8 (eight) hours as needed for muscle spasms.     TRELEGY ELLIPTA 100-62.5-25 MCG/ACT AEPB Take 1 puff by mouth daily as needed. Pt not      Allergies: No Known Allergies  Social History: Social History   Tobacco Use   Smoking status: Former    Types: Cigarettes    Start date: 1992    Quit date: 1972    Years since quitting: 52.9   Smokeless tobacco: Never   Tobacco comments:    08-08-2023  per pt quit smoking 1992,  started age 50,   Vaping Use   Vaping status: Never Used  Substance Use Topics   Alcohol use: Yes    Alcohol/week: 7.0 standard drinks of alcohol    Types: 7 Cans of beer per week    Comment: 08-08-2023  one beer per night ,  16 oz   Drug use: Not Currently    Types: Marijuana    Comment: 10/01/2013 "last drug use was 10 yr ago"    Family History Family History  Problem Relation Age of Onset   Diabetes Mother    Hypertension Father    Lupus Sister     ROS   Objective   Vital signs in last 24 hours: BP (!) 113/55 (BP Location: Left Arm)   Pulse 95   Temp 97.9 F (36.6 C) (Oral)   Resp 16   Ht 5\' 9"  (1.753 m)   Wt 94.8 kg   SpO2 99%   BMI 30.86 kg/m   Physical Exam General: NAD, A&O, resting, appropriate HEENT: [place]/AT Pulmonary: Normal work of breathing Cardiovascular: RRR, no cyanosis Abdomen: Soft, NTTP, nondistended GU: Swollen perineum with 3 abutting areas of erythema weeping serosanguineous fluid. Neuro: Appropriate, no focal neurological deficits  Most Recent  Labs: Lab Results  Component Value Date   WBC 10.2 09/13/2023   HGB 10.4 (L) 09/13/2023  HCT 34.4 (L) 09/13/2023   PLT 164 09/13/2023    Lab Results  Component Value Date   NA 134 (L) 09/13/2023   K 4.0 09/13/2023   CL 105 09/13/2023   CO2 18 (L) 09/13/2023   BUN 44 (H) 09/13/2023   CREATININE 3.72 (H) 09/13/2023   CALCIUM 8.4 (L) 09/13/2023   MG 2.1 09/09/2022    Lab Results  Component Value Date   INR 1.06 10/01/2013   APTT 26 08/12/2013     Urine Culture: @[id number] (laburin,org,[id number])@   IMAGING: CT ABDOMEN PELVIS WO CONTRAST  Result Date: 09/13/2023 CLINICAL DATA:  History of prostate cancer with near syncopal episode. Ongoing groin pain and worsening suspected perianal abscess/fistula. * Tracking Code: BO * EXAM: CT ABDOMEN AND PELVIS WITHOUT CONTRAST TECHNIQUE: Multidetector CT imaging of the abdomen and pelvis was performed following the standard protocol without IV contrast. RADIATION DOSE REDUCTION: This exam was performed according to the departmental dose-optimization program which includes automated exposure control, adjustment of the mA and/or kV according to patient size and/or use of iterative reconstruction technique. COMPARISON:  CT abdomen and pelvis dated 09/08/2023 and multiple priors dating back to 06/10/2013 FINDINGS: Lower chest: Similar mild bilateral bilateral lower lobe, middle lobe bronchiectasis with scattered tree-in-bud nodules and subsegmental mucous plugging. No pleural effusion or pneumothorax demonstrated. Partially imaged heart size is normal. Hepatobiliary: No focal hepatic lesions. No intra or extrahepatic biliary ductal dilation. Normal gallbladder. Pancreas: No focal lesions or main ductal dilation. Spleen: Normal in size without focal abnormality. Adrenals/Urinary Tract: No adrenal nodules. No suspicious renal mass on this noncontrast enhanced examination, calculi or hydronephrosis. Suboptimal evaluation of the bladder due to beam  hardening artifact from hip arthroplasties. Stomach/Bowel: Normal appearance of the stomach. No evidence of bowel wall thickening, distention, or inflammatory changes. Colonic diverticulosis without acute diverticulitis. Normal appendix. Vascular/Lymphatic: Aortic atherosclerosis. No enlarged abdominal or pelvic lymph nodes. Reproductive: Suboptimal evaluation of the prostate due to beam hardening artifact from hip arthroplasties. No gross abnormality on the [facility] sequence. Prostate fiducials in-situ. Heterogeneous hyperdensity posterior to the prostate likely corresponds to Hydrogel perirectal spacer material. Other: No free fluid, fluid collection, or free air. Non FDG-avid cystic focus within the posterior mesentery measures 3.6 x 2.8 cm (4:43) closely associated with the proximal duodenum. In retrospect, this structure has been present since at least 2014 but slowly increasing in size in the interval. Musculoskeletal: No acute or abnormal lytic or blastic osseous lesions. Bilateral hip arthroplasties. Small paraumbilical Richter hernia. Multilevel degenerative changes of the partially imaged thoracic and lumbar spine. Interval development of subcutaneous emphysema at the site of previously noted perianal fluid collection with anterior and inferior extension of gas into the perineum and posterior scrotum. IMPRESSION: 1. Interval development of subcutaneous emphysema at the site of previously noted perianal fluid collection with anterior and inferior extension of gas into the perineum and posterior scrotum, which may reflect sequela of radiation therapy, however Fournier gangrene is not excluded. 2. Heterogeneous hypodensity within the SpaceOAR Hydrogel material, which may reflect iatrogenic gas introduced at the time of injection, however superimposed infection can not be excluded. 3. Similar mild bilateral lower lobe, middle lobe bronchiectasis with scattered tree-in-bud nodules and subsegmental mucous plugging,  likely due to chronic atypical infection such as Mycobacterium avium complex. 4. Non FDG-avid cystic focus within the posterior mesentery measures 3.6 x 2.8 cm, closely associated with the proximal duodenum. In retrospect, this structure has been present since at least 2014 but slowly increasing in size in the interval.  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 5.  Aortic Atherosclerosis (ICD10-I70.0). These results were called by telephone at the time of interpretation on 09/13/2023 at 1:03 pm to provider [name] [facility] , who verbally acknowledged these results. Electronically Signed   By: Agustin Cree M.D.   On: 09/13/2023 13:03   CT Head Wo Contrast  Result Date: 09/13/2023 CLINICAL DATA:  Syncope.  Dizziness. EXAM: CT HEAD WITHOUT CONTRAST TECHNIQUE: Contiguous axial images were obtained from the base of the skull through the vertex without intravenous contrast. RADIATION DOSE REDUCTION: This exam was performed according to the departmental dose-optimization program which includes automated exposure control, adjustment of the mA and/or kV according to patient size and/or use of iterative reconstruction technique. COMPARISON:  Head CT 02/16/2021 FINDINGS: Brain: There is no evidence of an acute infarct, intracranial hemorrhage, mass, midline shift, or extra-axial fluid collection. The ventricles and sulci are normal. Vascular: No hyperdense vessel or unexpected calcification. Skull: No acute fracture or suspicious osseous lesion. Sinuses/Orbits: Partially visualized fluid in the left maxillary sinus, also present on the prior CT with chronically opacified left maxillary sinus ostium and infundibulum. Clear mastoid air cells. Unremarkable orbits. Other: None. IMPRESSION: No evidence of acute intracranial abnormality. Electronically Signed   By: Sebastian Ache M.D.   On: 09/13/2023 12:23    ------  Elmon Kirschner, NP Pager: [phone]   Please contact the  urology consult pager with any further questions/concerns.

## 2023-09-13 NOTE — Progress Notes (Signed)
ED Pharmacy Antibiotic Sign Off An antibiotic consult was received from an ED provider for vancomycin and cefepime per pharmacy dosing for wound. A chart review was completed to assess appropriateness.   The following one time order(s) were placed:  Vancomycin 2000 mg IV + cefepime 2 g IV  Further antibiotic and/or antibiotic pharmacy consults should be ordered by the admitting provider if indicated.   Cindi Carbon, PharmD 09/13/23 1:25 PM

## 2023-09-13 NOTE — Telephone Encounter (Signed)
RN identified patient wife Ms. Sheilann on call state patient woke up this morning with dizziness and redness/swelling to groin area requested she take him to ED for evaluation.  He requested radiation treatment be cancelled this morning will keep staff updated.

## 2023-09-13 NOTE — Transfer of Care (Signed)
Immediate Anesthesia Transfer of Care Note  Patient: Remmy Beerman  Procedure(s) Performed: INCISION AND DRAINAGE ABSCESS  Patient Location: PACU  Anesthesia Type:General  Level of Consciousness: awake and patient cooperative  Airway & Oxygen Therapy: Patient Spontanous Breathing and Patient connected to face mask  Post-op Assessment: Report given to RN and Post -op Vital signs reviewed and stable  Post vital signs: Reviewed and stable  Last Vitals:  Vitals Value Taken Time  BP    Temp    Pulse    Resp    SpO2      Last Pain:  Vitals:   09/13/23 1732  TempSrc: Oral  PainSc: 0-No pain         Complications: No notable events documented.

## 2023-09-13 NOTE — ED Notes (Signed)
Nurse notified of patients blood pressure I have taken it multiple times and each time it has been 71/45

## 2023-09-13 NOTE — ED Provider Triage Note (Signed)
Emergency Medicine Provider Triage Evaluation Note  Evan Brock , a 72 y.o. male  was evaluated in triage.  Pt complains of syncope with wound in his perineum area.  Patient is been seen previously for wound his perineum but states is gotten worse and he has been passing out due to the pain and is unable to walk due to the discomfort it causes.  Patient denies any nausea vomiting or fevers or abdominal pain.  Patient states that he has not had any drainage coming out of this area.  Patient does have history of prostate cancer and is on radiotherapy.  Review of Systems  Positive: See HPI Negative: See HPI  Physical Exam  BP (!) 91/52   Pulse (!) 101   Temp 97.9 F (36.6 C) (Oral)   Resp 18   Ht 5\' 9"  (1.753 m)   Wt 94.8 kg   SpO2 94%   BMI 30.86 kg/m  Gen:   Awake, distress Resp:  Normal effort  MSK:   Moves extremities without difficulty  Other:  With a chaperone GU exam was conducted that does show friable tissue in the perineal area with erythema however no signs of discharge  Medical Decision Making  Medically screening exam initiated at 10:34 AM.  Appropriate orders placed.  Evan Brock was informed that the remainder of the evaluation will be completed by another provider, this initial triage assessment does not replace that evaluation, and the importance of remaining in the ED until their evaluation is complete.  Workup initiated, patient is tachycardic and mildly hypotensive after having reported syncopal episode due to pain in his perineal area, patient is cancer patient.  Labs drawn along with imaging.  Patient stable at this time.   Netta Corrigan, PA-C 09/13/23 1037

## 2023-09-14 ENCOUNTER — Inpatient Hospital Stay (HOSPITAL_COMMUNITY): Payer: Medicare HMO

## 2023-09-14 ENCOUNTER — Encounter (HOSPITAL_COMMUNITY)
Admission: EM | Disposition: A | Discharge status: Home Health | Payer: Self-pay | Source: Home / Self Care | Attending: Internal Medicine

## 2023-09-14 ENCOUNTER — Inpatient Hospital Stay (HOSPITAL_COMMUNITY): Payer: Medicare HMO | Admitting: Anesthesiology

## 2023-09-14 ENCOUNTER — Ambulatory Visit: Payer: Medicare HMO

## 2023-09-14 ENCOUNTER — Encounter (HOSPITAL_COMMUNITY): Payer: Self-pay | Admitting: Urology

## 2023-09-14 DIAGNOSIS — K611 Rectal abscess: Secondary | ICD-10-CM | POA: Diagnosis not present

## 2023-09-14 DIAGNOSIS — R578 Other shock: Secondary | ICD-10-CM | POA: Diagnosis not present

## 2023-09-14 DIAGNOSIS — R6521 Severe sepsis with septic shock: Secondary | ICD-10-CM

## 2023-09-14 DIAGNOSIS — A419 Sepsis, unspecified organism: Secondary | ICD-10-CM | POA: Diagnosis not present

## 2023-09-14 DIAGNOSIS — J449 Chronic obstructive pulmonary disease, unspecified: Secondary | ICD-10-CM

## 2023-09-14 DIAGNOSIS — J479 Bronchiectasis, uncomplicated: Secondary | ICD-10-CM | POA: Diagnosis not present

## 2023-09-14 DIAGNOSIS — N179 Acute kidney failure, unspecified: Secondary | ICD-10-CM | POA: Diagnosis not present

## 2023-09-14 HISTORY — PX: SCROTAL EXPLORATION: SHX2386

## 2023-09-14 LAB — ECHOCARDIOGRAM COMPLETE
AR max vel: 2.74 cm2
AV Area VTI: 3.04 cm2
AV Area mean vel: 2.97 cm2
AV Mean grad: 10 mm[Hg]
AV Peak grad: 19 mm[Hg]
Ao pk vel: 2.18 m/s
Area-P 1/2: 3.76 cm2
Calc EF: 62.6 %
Height: 69 in
MV M vel: 4.57 m/s
MV Peak grad: 83.5 mm[Hg]
MV VTI: 4.27 cm2
S' Lateral: 3.4 cm
Single Plane A2C EF: 60.7 %
Single Plane A4C EF: 63.3 %
Weight: 3315.72 [oz_av]

## 2023-09-14 LAB — BASIC METABOLIC PANEL
Anion gap: 9 (ref 5–15)
BUN: 42 mg/dL — ABNORMAL HIGH (ref 8–23)
CO2: 19 mmol/L — ABNORMAL LOW (ref 22–32)
Calcium: 7.6 mg/dL — ABNORMAL LOW (ref 8.9–10.3)
Chloride: 107 mmol/L (ref 98–111)
Creatinine, Ser: 3.3 mg/dL — ABNORMAL HIGH (ref 0.61–1.24)
GFR, Estimated: 19 mL/min — ABNORMAL LOW (ref >60)
Glucose, Bld: 140 mg/dL — ABNORMAL HIGH (ref 70–99)
Potassium: 4.7 mmol/L (ref 3.5–5.1)
Sodium: 135 mmol/L (ref 135–145)

## 2023-09-14 LAB — BLOOD GAS, ARTERIAL
Acid-base deficit: 9.5 mmol/L — ABNORMAL HIGH (ref 0.0–2.0)
Acid-base deficit: 7.3 mmol/L — ABNORMAL HIGH (ref 0.0–2.0)
Bicarbonate: 19.3 mmol/L — ABNORMAL LOW (ref 20.0–28.0)
Bicarbonate: 17.9 mmol/L — ABNORMAL LOW (ref 20.0–28.0)
Drawn by: 29503
Drawn by: 25770
FIO2: 40 %
MECHVT: 560 mL
O2 Content: 3 L/min
O2 Saturation: 98 %
O2 Saturation: 100 %
PEEP: 5 cmH2O
Patient temperature: 37
Patient temperature: 36.5
RATE: 20 {breaths}/min
pCO2 arterial: 53 mm[Hg] — ABNORMAL HIGH (ref 32–48)
pCO2 arterial: 33 mm[Hg] (ref 32–48)
pH, Arterial: 7.17 — CL (ref 7.35–7.45)
pH, Arterial: 7.34 — ABNORMAL LOW (ref 7.35–7.45)
pO2, Arterial: 82 mm[Hg] — ABNORMAL LOW (ref 83–108)
pO2, Arterial: 139 mm[Hg] — ABNORMAL HIGH (ref 83–108)

## 2023-09-14 LAB — PHOSPHORUS: Phosphorus: 5 mg/dL — ABNORMAL HIGH (ref 2.5–4.6)

## 2023-09-14 LAB — LACTIC ACID, PLASMA: Lactic Acid, Venous: 1.1 mmol/L (ref 0.5–1.9)

## 2023-09-14 LAB — GLUCOSE, CAPILLARY
Glucose-Capillary: 112 mg/dL — ABNORMAL HIGH (ref 70–99)
Glucose-Capillary: 116 mg/dL — ABNORMAL HIGH (ref 70–99)
Glucose-Capillary: 121 mg/dL — ABNORMAL HIGH (ref 70–99)
Glucose-Capillary: 128 mg/dL — ABNORMAL HIGH (ref 70–99)
Glucose-Capillary: 130 mg/dL — ABNORMAL HIGH (ref 70–99)
Glucose-Capillary: 142 mg/dL — ABNORMAL HIGH (ref 70–99)

## 2023-09-14 LAB — CBC
HCT: 31.3 % — ABNORMAL LOW (ref 39.0–52.0)
Hemoglobin: 9.2 g/dL — ABNORMAL LOW (ref 13.0–17.0)
MCH: 26.1 pg (ref 26.0–34.0)
MCHC: 29.4 g/dL — ABNORMAL LOW (ref 30.0–36.0)
MCV: 88.9 fL (ref 80.0–100.0)
Platelets: 145 10*3/uL — ABNORMAL LOW (ref 150–400)
RBC: 3.52 MIL/uL — ABNORMAL LOW (ref 4.22–5.81)
RDW: 16.1 % — ABNORMAL HIGH (ref 11.5–15.5)
WBC: 11.6 10*3/uL — ABNORMAL HIGH (ref 4.0–10.5)
nRBC: 0 % (ref 0.0–0.2)

## 2023-09-14 LAB — MAGNESIUM: Magnesium: 1.8 mg/dL (ref 1.7–2.4)

## 2023-09-14 SURGERY — EXPLORATION, SCROTUM
Anesthesia: General

## 2023-09-14 MED ORDER — PROPOFOL 10 MG/ML IV BOLUS
INTRAVENOUS | Status: AC
  Filled 2023-09-14: qty 20

## 2023-09-14 MED ORDER — VASOPRESSIN 20 UNIT/ML IV SOLN
INTRAVENOUS | Status: AC
  Filled 2023-09-14: qty 1

## 2023-09-14 MED ORDER — BUDESONIDE 0.25 MG/2ML IN SUSP: 0.2500 mg | Freq: Two times a day (BID) | RESPIRATORY_TRACT | Status: DC

## 2023-09-14 MED ORDER — ARFORMOTEROL TARTRATE 15 MCG/2ML IN NEBU
15.0000 ug | INHALATION_SOLUTION | Freq: Two times a day (BID) | RESPIRATORY_TRACT | Status: DC
  Administered 2023-09-15 – 2023-09-17 (×5): 15 ug via RESPIRATORY_TRACT
  Filled 2023-09-14 (×5): qty 2

## 2023-09-14 MED ORDER — VASOPRESSIN 20 UNIT/ML IV SOLN
INTRAVENOUS | Status: DC | PRN
  Administered 2023-09-14: 2 [IU] via INTRAVENOUS

## 2023-09-14 MED ORDER — DOCUSATE SODIUM 50 MG/5ML PO LIQD
100.0000 mg | Freq: Two times a day (BID) | ORAL | Status: DC
  Administered 2023-09-14 – 2023-09-16 (×4): 100 mg
  Filled 2023-09-14 (×4): qty 10

## 2023-09-14 MED ORDER — LACTATED RINGERS IV SOLN: INTRAVENOUS | Status: DC | PRN

## 2023-09-14 MED ORDER — ONDANSETRON HCL 4 MG/2ML IJ SOLN
INTRAMUSCULAR | Status: AC
  Filled 2023-09-14: qty 2

## 2023-09-14 MED ORDER — FENTANYL BOLUS VIA INFUSION
25.0000 ug | INTRAVENOUS | Status: DC | PRN
  Administered 2023-09-14: 25 ug via INTRAVENOUS
  Administered 2023-09-14: 50 ug via INTRAVENOUS
  Administered 2023-09-14 (×2): 25 ug via INTRAVENOUS
  Administered 2023-09-14: 75 ug via INTRAVENOUS
  Administered 2023-09-15: 50 ug via INTRAVENOUS
  Administered 2023-09-15 (×2): 75 ug via INTRAVENOUS
  Administered 2023-09-15: 50 ug via INTRAVENOUS

## 2023-09-14 MED ORDER — BUDESONIDE 0.25 MG/2ML IN SUSP
0.2500 mg | Freq: Two times a day (BID) | RESPIRATORY_TRACT | Status: DC
  Administered 2023-09-15 – 2023-09-17 (×5): 0.25 mg via RESPIRATORY_TRACT
  Filled 2023-09-14 (×5): qty 2

## 2023-09-14 MED ORDER — ORAL CARE MOUTH RINSE: 15.0000 mL | OROMUCOSAL | Status: DC | PRN

## 2023-09-14 MED ORDER — MIDAZOLAM HCL 2 MG/2ML IJ SOLN
INTRAMUSCULAR | Status: AC
  Filled 2023-09-14: qty 2

## 2023-09-14 MED ORDER — ARFORMOTEROL TARTRATE 15 MCG/2ML IN NEBU: 15.0000 ug | INHALATION_SOLUTION | Freq: Two times a day (BID) | RESPIRATORY_TRACT | Status: DC

## 2023-09-14 MED ORDER — PROPOFOL 500 MG/50ML IV EMUL
INTRAVENOUS | Status: DC | PRN
  Administered 2023-09-14: 40 ug/kg/min via INTRAVENOUS

## 2023-09-14 MED ORDER — PROPOFOL 10 MG/ML IV BOLUS
INTRAVENOUS | Status: DC | PRN
  Administered 2023-09-14: 50 mg via INTRAVENOUS

## 2023-09-14 MED ORDER — IPRATROPIUM BROMIDE 0.02 % IN SOLN
0.5000 mg | Freq: Three times a day (TID) | RESPIRATORY_TRACT | Status: DC
  Administered 2023-09-15 – 2023-09-17 (×7): 0.5 mg via RESPIRATORY_TRACT
  Filled 2023-09-14 (×7): qty 2.5

## 2023-09-14 MED ORDER — LINEZOLID 600 MG PO TABS
600.0000 mg | ORAL_TABLET | Freq: Two times a day (BID) | ORAL | Status: DC
  Filled 2023-09-14 (×2): qty 1

## 2023-09-14 MED ORDER — LIDOCAINE 2% (20 MG/ML) 5 ML SYRINGE
INTRAMUSCULAR | Status: DC | PRN
  Administered 2023-09-14: 50 mg via INTRAVENOUS

## 2023-09-14 MED ORDER — HYDRALAZINE HCL 20 MG/ML IJ SOLN
INTRAMUSCULAR | Status: AC
  Filled 2023-09-14: qty 1

## 2023-09-14 MED ORDER — FENTANYL CITRATE (PF) 100 MCG/2ML IJ SOLN
INTRAMUSCULAR | Status: AC
  Filled 2023-09-14: qty 2

## 2023-09-14 MED ORDER — FENTANYL CITRATE (PF) 100 MCG/2ML IJ SOLN
INTRAMUSCULAR | Status: DC | PRN
  Administered 2023-09-14 (×2): 50 ug via INTRAVENOUS

## 2023-09-14 MED ORDER — LACTATED RINGERS IV BOLUS: 1000.0000 mL | Freq: Once | INTRAVENOUS | Status: DC

## 2023-09-14 MED ORDER — ORAL CARE MOUTH RINSE
15.0000 mL | OROMUCOSAL | Status: DC
  Administered 2023-09-14 – 2023-09-16 (×20): 15 mL via OROMUCOSAL

## 2023-09-14 MED ORDER — MAGNESIUM SULFATE 2 GM/50ML IV SOLN
2.0000 g | Freq: Once | INTRAVENOUS | Status: AC
  Administered 2023-09-14: 2 g via INTRAVENOUS
  Filled 2023-09-14: qty 50

## 2023-09-14 MED ORDER — ONDANSETRON HCL 4 MG/2ML IJ SOLN
INTRAMUSCULAR | Status: DC | PRN
  Administered 2023-09-14: 4 mg via INTRAVENOUS

## 2023-09-14 MED ORDER — POLYETHYLENE GLYCOL 3350 17 G PO PACK
17.0000 g | PACK | Freq: Every day | ORAL | Status: DC
  Administered 2023-09-15 – 2023-09-16 (×2): 17 g
  Filled 2023-09-14 (×2): qty 1

## 2023-09-14 MED ORDER — FENTANYL 2500MCG IN NS 250ML (10MCG/ML) PREMIX INFUSION
25.0000 ug/h | INTRAVENOUS | Status: DC
  Administered 2023-09-14: 25 ug/h via INTRAVENOUS
  Filled 2023-09-14 (×2): qty 250

## 2023-09-14 MED ORDER — IPRATROPIUM BROMIDE 0.02 % IN SOLN: 0.5000 mg | Freq: Four times a day (QID) | RESPIRATORY_TRACT | Status: DC

## 2023-09-14 MED ORDER — SODIUM CHLORIDE (PF) 0.9 % IJ SOLN
INTRAMUSCULAR | Status: AC
  Filled 2023-09-14: qty 10

## 2023-09-14 MED ORDER — BUPIVACAINE HCL 0.25 % IJ SOLN
INTRAMUSCULAR | Status: AC
  Filled 2023-09-14: qty 1

## 2023-09-14 MED ORDER — FENTANYL CITRATE PF 50 MCG/ML IJ SOSY: 25.0000 ug | PREFILLED_SYRINGE | Freq: Once | INTRAMUSCULAR | Status: DC

## 2023-09-14 MED ORDER — PROPOFOL 1000 MG/100ML IV EMUL
0.0000 ug/kg/min | INTRAVENOUS | Status: DC
  Administered 2023-09-14 – 2023-09-15 (×2): 30 ug/kg/min via INTRAVENOUS
  Administered 2023-09-15: 20 ug/kg/min via INTRAVENOUS
  Filled 2023-09-14 (×3): qty 100

## 2023-09-14 MED ORDER — ROCURONIUM BROMIDE 100 MG/10ML IV SOLN
INTRAVENOUS | Status: DC | PRN
  Administered 2023-09-14: 20 mg via INTRAVENOUS
  Administered 2023-09-14: 50 mg via INTRAVENOUS

## 2023-09-14 MED ORDER — 0.9 % SODIUM CHLORIDE (POUR BTL) OPTIME
TOPICAL | Status: DC | PRN
  Administered 2023-09-14: 1000 mL

## 2023-09-14 MED ORDER — FAMOTIDINE 20 MG PO TABS
20.0000 mg | ORAL_TABLET | Freq: Every day | ORAL | Status: DC
  Administered 2023-09-14 – 2023-09-16 (×3): 20 mg
  Filled 2023-09-14 (×3): qty 1

## 2023-09-14 SURGICAL SUPPLY — 24 items
BLADE CLIPPER SENSICLIP SURGIC (BLADE) ×1 IMPLANT
BNDG GAUZE DERMACEA FLUFF 4 (GAUZE/BANDAGES/DRESSINGS) ×1 IMPLANT
DERMABOND ADVANCED .7 DNX12 (GAUZE/BANDAGES/DRESSINGS) ×1 IMPLANT
DRAIN PENROSE 0.25X18 (DRAIN) ×1 IMPLANT
DRAPE LAPAROTOMY T 98X78 PEDS (DRAPES) ×1 IMPLANT
DRAPE UNDERBUTTOCKS STRL (DISPOSABLE) IMPLANT
ELECT REM PT RETURN 15FT ADLT (MISCELLANEOUS) ×1 IMPLANT
GAUZE PACKING IODOFORM 2 (PACKING) IMPLANT
GAUZE PAD ABD 8X10 STRL (GAUZE/BANDAGES/DRESSINGS) IMPLANT
GLOVE BIO SURGEON STRL SZ 6.5 (GLOVE) ×1 IMPLANT
GOWN STRL REUS W/ TWL LRG LVL3 (GOWN DISPOSABLE) ×1 IMPLANT
KIT BASIN OR (CUSTOM PROCEDURE TRAY) ×1 IMPLANT
NDL HYPO 25X1 1.5 SAFETY (NEEDLE) ×1 IMPLANT
NEEDLE HYPO 25X1 1.5 SAFETY (NEEDLE) IMPLANT
PACK GENERAL/GYN (CUSTOM PROCEDURE TRAY) ×1 IMPLANT
PENCIL SMOKE EVACUATOR (MISCELLANEOUS) IMPLANT
SUPPORTER AHLETIC TETRA LG (SOFTGOODS) IMPLANT
SUT CHROMIC 3 0 SH 27 (SUTURE) ×1 IMPLANT
SUT ETHILON 4 0 PS 2 18 (SUTURE) IMPLANT
SUT MNCRL AB 4-0 PS2 18 (SUTURE) IMPLANT
SUT VIC AB 2-0 SH 27XBRD (SUTURE) IMPLANT
SUT VIC AB 3-0 SH 27X BRD (SUTURE) IMPLANT
SYR CONTROL 10ML LL (SYRINGE) ×1 IMPLANT
TOWEL OR 17X26 10 PK STRL BLUE (TOWEL DISPOSABLE) ×1 IMPLANT

## 2023-09-14 NOTE — Progress Notes (Signed)
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 Culleton POD1 s/p I&D of fourniers gangrene of perineum and scrotum.  Currently in ICU on bipap.   Principal Problem:   Septic shock (HCC) Active Problems:   AKI (acute kidney injury) (HCC)   Fournier's gangrene  Current Facility-Administered Medications  Medication Dose Route Frequency Provider Last Rate Last Admin   0.9 %  sodium chloride infusion   Intravenous PRN Luciano Cutter, MD   Stopped at 09/13/23 2117   0.9 %  sodium chloride infusion  250 mL Intravenous Continuous Paliwal, Aditya, MD 10 mL/hr at 09/14/23 0734 Infusion Verify at 09/14/23 0734   ceFEPIme (MAXIPIME) 2 g in sodium chloride 0.9 % 100 mL IVPB  2 g Intravenous Q24H Earl Many M, RPH       Chlorhexidine Gluconate Cloth 2 % PADS 6 each  6 each Topical Daily Luciano Cutter, MD       clindamycin (CLEOCIN) IVPB 600 mg  600 mg Intravenous Q8H Paliwal, Aditya, MD   Stopped at 09/14/23 0600   docusate sodium (COLACE) capsule 100 mg  100 mg Oral BID PRN Rene Paci, MD       fluticasone furoate-vilanterol (BREO ELLIPTA) 100-25 MCG/ACT 1 puff  1 puff Inhalation Daily Winter, Christopher Aaron, MD   1 puff at 09/14/23 0715   And   umeclidinium bromide (INCRUSE ELLIPTA) 62.5 MCG/ACT 1 puff  1 puff Inhalation Daily Winter, Christopher Aaron, MD   1 puff at 09/14/23 0715   heparin injection 5,000 Units  5,000 Units Subcutaneous Q8H Rene Paci, MD   5,000 Units at 09/14/23 0610   HYDROmorphone (DILAUDID) injection 1 mg  1 mg Intravenous Q3H PRN Paliwal, Eliezer Lofts, MD   1 mg at 09/14/23 0340   insulin aspart (novoLOG) injection 0-15 Units  0-15 Units Subcutaneous Q4H Rene Paci, MD   2 Units at 09/14/23 0820   ipratropium-albuterol (DUONEB) 0.5-2.5 (3) MG/3ML nebulizer solution 3 mL  3 mL Nebulization Q6H  PRN Rene Paci, MD   3 mL at 09/13/23 2150   metroNIDAZOLE (FLAGYL) IVPB 500 mg  500 mg Intravenous Q12H Hessie Knows, [facility]   Stopped at 09/13/23 2217   norepinephrine (LEVOPHED) 4mg  in (0.016 mg/mL) premix infusion  2-10 mcg/min Intravenous Titrated Conrad [place], MD 30 mL/hr at 09/14/23 0734 8 mcg/min at 09/14/23 0734   Oral care mouth rinse  15 mL Mouth Rinse PRN Luciano Cutter, MD       oxyCODONE (Oxy IR/ROXICODONE) immediate release tablet 10 mg  10 mg Oral Q4H PRN Paliwal, Eliezer Lofts, MD   10 mg at 09/14/23 1610   Or   oxyCODONE (Oxy IR/ROXICODONE) immediate release tablet 15 mg  15 mg Oral Q4H PRN Paliwal, Aditya, MD       pantoprazole (PROTONIX) injection 40 mg  40 mg Intravenous Q24H Winter, Christopher Aaron, MD   40 mg at 09/13/23 1618   polyethylene glycol (MIRALAX / GLYCOLAX) packet 17 g  17 g Oral Daily PRN Rene Paci, MD       [START ON 09/15/2023] vancomycin (VANCOCIN) IVPB 1000 mg/200 mL premix  1,000 mg Intravenous Q48H Hessie Knows, [place]         Objective: Vital: Vitals:   09/14/23 0715 09/14/23 0730 09/14/23 0800 09/14/23 0840  BP:  110/60 (!) 97/53 (!) 91/55  Pulse:  91 87 85  Resp:  19 16 15   Temp:   97.6 F (36.4 C)   TempSrc:   Oral   SpO2: 94% 93% 96% 95%  Weight:      Height:       I/Os: I/O last 3 completed shifts: In: 4468.8 [I.V.:1693.8; IV Piggyback:2775] Out: 1350 [Urine:1350]  Physical Exam:  General: Patient is in no apparent distress Lungs: Normal respiratory effort, chest expands symmetrically. GI: nondistended GU: defer until OR Foley: draining clear yellow urine  Ext: lower extremities symmetric  Lab Results: Recent Labs    09/13/23 1040 09/14/23 0249  WBC 10.2 11.6*  HGB 10.4* 9.2*  HCT 34.4* 31.3*   Recent Labs    09/13/23 1040 09/14/23 0249  NA 134* 135  K 4.0 4.7  CL 105 107  CO2 18* 19*  GLUCOSE 151* 140*  BUN 44* 42*  CREATININE 3.72* 3.30*  CALCIUM 8.4* 7.6*   No results  for input(s): "LABPT", "INR" in the last 72 hours. No results for input(s): "LABURIN" in the last 72 hours. Results for orders placed or performed during the hospital encounter of 09/13/23  Blood culture (routine x 2)     Status: None (Preliminary result)   Collection Time: 09/13/23  1:21 PM   Specimen: BLOOD  Result Value Ref Range Status   Specimen Description   Final    BLOOD RIGHT ANTECUBITAL Performed at [facility], [address] [place], [place] [address]    Special Requests   Final    BOTTLES DRAWN AEROBIC AND ANAEROBIC Blood Culture adequate volume Performed at [facility], [address], [place], [place] [address]    Culture   Final    NO GROWTH < 24 HOURS Performed at [facility], [address], [place], [place] [address]    Report Status PENDING  Incomplete  Blood culture (routine x 2)     Status: None (Preliminary result)   Collection Time: 09/13/23  1:21 PM   Specimen: BLOOD  Result Value Ref Range Status   Specimen Description   Final    BLOOD LEFT ANTECUBITAL Performed at [facility], [address], [place], [place] [address]    Special Requests   Final    BOTTLES DRAWN AEROBIC AND ANAEROBIC Blood Culture adequate volume Performed at [facility], [address] [place], [place] [address]    Culture   Final    NO GROWTH < 24 HOURS Performed at [facility], [address], [place], [place] [address]    Report Status PENDING  Incomplete  Aerobic/Anaerobic Culture w Gram Stain (surgical/deep wound)     Status: None (Preliminary result)   Collection Time: 09/13/23  6:53 PM   Specimen: Wound; Abscess  Result Value Ref Range Status   Specimen Description   Final    WOUND Performed at [facility], [address], [place], [place] [address]    Special Requests   Final    PERINEAL ABSCESS Performed at [facility], [address] [place], [place] [address]    Gram Stain   Final    FEW WBC PRESENT, PREDOMINANTLY PMN FEW GRAM POSITIVE COCCI IN PAIRS FEW GRAM NEGATIVE RODS    Culture   Final    NO GROWTH < 12 HOURS Performed at [facility], [address] [place], [place] [address]    Report Status PENDING  Incomplete    Studies/Results: CT ABDOMEN PELVIS  WO CONTRAST  Result Date: 09/13/2023 CLINICAL DATA:  History of prostate cancer with near syncopal episode. Ongoing groin pain and worsening suspected perianal abscess/fistula. * Tracking Code: BO * EXAM: CT ABDOMEN AND PELVIS WITHOUT CONTRAST TECHNIQUE: Multidetector CT imaging of the abdomen and pelvis was performed following the standard protocol without IV contrast. RADIATION DOSE REDUCTION: This exam was performed according to the departmental dose-optimization program which includes automated exposure control, adjustment of the mA and/or kV according to patient size and/or use of iterative reconstruction technique. COMPARISON:  CT abdomen and pelvis dated 09/08/2023 and multiple priors dating back to 06/10/2013 FINDINGS: Lower chest: Similar mild bilateral bilateral lower lobe, middle lobe bronchiectasis with scattered tree-in-bud nodules and subsegmental mucous plugging. No pleural effusion or pneumothorax demonstrated. Partially imaged heart size is normal. Hepatobiliary: No focal hepatic lesions. No intra or extrahepatic biliary ductal dilation. Normal gallbladder. Pancreas: No focal lesions or main ductal dilation. Spleen: Normal in size without focal abnormality. Adrenals/Urinary Tract: No adrenal nodules. No suspicious renal mass on this noncontrast enhanced examination, calculi or hydronephrosis. Suboptimal evaluation of the bladder due to beam hardening artifact from hip arthroplasties. Stomach/Bowel: Normal appearance of the stomach. No evidence of bowel wall thickening, distention, or inflammatory changes. Colonic diverticulosis without acute  diverticulitis. Normal appendix. Vascular/Lymphatic: Aortic atherosclerosis. No enlarged abdominal or pelvic lymph nodes. Reproductive: Suboptimal evaluation of the prostate due to beam hardening artifact from hip arthroplasties. No gross abnormality on the [facility] sequence. Prostate fiducials in-situ. Heterogeneous hyperdensity posterior to the prostate likely corresponds to Hydrogel perirectal spacer material. Other: No free fluid, fluid collection, or free air. Non FDG-avid cystic focus within the posterior mesentery measures 3.6 x 2.8 cm (4:43) closely associated with the proximal duodenum. In retrospect, this structure has been present since at least 2014 but slowly increasing in size in the interval. Musculoskeletal: No acute or abnormal lytic or blastic osseous lesions. Bilateral hip arthroplasties. Small paraumbilical Richter hernia. Multilevel degenerative changes of the partially imaged thoracic and lumbar spine. Interval development of subcutaneous emphysema at the site of previously noted perianal fluid collection with anterior and inferior extension of gas into the perineum and posterior scrotum. IMPRESSION: 1. Interval development of subcutaneous emphysema at the site of previously noted perianal fluid collection with anterior and inferior extension of gas into the perineum and posterior scrotum, which may reflect sequela of radiation therapy, however Fournier gangrene is not excluded. 2. Heterogeneous hypodensity within the SpaceOAR Hydrogel material, which may reflect iatrogenic gas introduced at the time of injection, however superimposed infection can not be excluded. 3. Similar mild bilateral lower lobe, middle lobe bronchiectasis with scattered tree-in-bud nodules and subsegmental mucous plugging, likely due to chronic atypical infection such as Mycobacterium avium complex. 4. Non FDG-avid cystic focus within the posterior mesentery measures 3.6 x 2.8 cm, closely associated with the proximal  duodenum. In retrospect, this structure has been present since at least 2014 but slowly increasing in size in the interval. 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 5.  Aortic Atherosclerosis (ICD10-I70.0). These results were called by telephone at the time of interpretation on 09/13/2023 at 1:03 pm to provider [name] , who verbally acknowledged these results. Electronically Signed   By: Agustin Cree M.D.   On: 09/13/2023 13:03   CT Head Wo Contrast  Result Date: 09/13/2023 CLINICAL DATA:  Syncope.  Dizziness. EXAM: CT HEAD WITHOUT CONTRAST TECHNIQUE: Contiguous axial images were obtained from the base of the skull through the vertex without intravenous  contrast. RADIATION DOSE REDUCTION: This exam was performed according to the departmental dose-optimization program which includes automated exposure control, adjustment of the mA and/or kV according to patient size and/or use of iterative reconstruction technique. COMPARISON:  Head CT 02/16/2021 FINDINGS: Brain: There is no evidence of an acute infarct, intracranial hemorrhage, mass, midline shift, or extra-axial fluid collection. The ventricles and sulci are normal. Vascular: No hyperdense vessel or unexpected calcification. Skull: No acute fracture or suspicious osseous lesion. Sinuses/Orbits: Partially visualized fluid in the left maxillary sinus, also present on the prior CT with chronically opacified left maxillary sinus ostium and infundibulum. Clear mastoid air cells. Unremarkable orbits. Other: None. IMPRESSION: No evidence of acute intracranial abnormality. Electronically Signed   By: Sebastian Ache M.D.   On: 09/13/2023 12:23    Assessment/Plan: Fournier's gangrene perineum/scrotum: -POD#1 s/p exploration, debridement and packing -plan to take back today for washout and repacking with general surgery -will likely need another washout/packing change in OR over the weekend and will discuss with on call  urologist  2. Prostate cancer: -pt received ADT June 2024 -suspend radiation treatment until abscess has healed   Kasandra Knudsen, MD Urology 09/14/2023, 9:20 AM

## 2023-09-14 NOTE — Plan of Care (Signed)
  Problem: Coping: Goal: Level of anxiety will decrease Outcome: Progressing   Problem: Pain Management: Goal: General experience of comfort will improve Outcome: Progressing   Problem: Safety: Goal: Ability to remain free from injury will improve Outcome: Progressing   Problem: Activity: Goal: Risk for activity intolerance will decrease Outcome: Not Progressing

## 2023-09-14 NOTE — Op Note (Signed)
Operative Note  Preoperative diagnosis:  1.  Fournier's gangrene of perineum/scrotum 2.  Septic shock  Postoperative diagnosis: 1.  Fournier's gangrene of perineum/scrotum 2.  Septic shock  Procedure(s): 1.  Perineal/scrotal exploration, washout, debridement 2.  Perineal/scrotal dressing change  Surgeon: Kasandra Knudsen, MD  Assistants:  None  Anesthesia:  General  Complications:  None  EBL:  10 cc  Specimens: 1. none  Drains/Catheters: 1.  16Fr foley  Intraoperative findings:   Perineal abscess cavity without necrotic appearing tissue, bleeding tissue edges seen, finger probe did not reveal any further loculations Small amount of scrotal tissue debridement Vigorous irrigation of abscess cavity  Dressing change with 2 inch packing strip and kerlix DRE revealed intact rectal wall between rectum and perineal cavity however anteriorly small disruption posterior to prostate  Indication:  Evan Brock is a 72 y.o. male with a history of grade 5 prostate cancer, status post gold fiducial marker and SpaceOAR placement on 08/14/2023 followed by IMRT treatments to the prostate.  The patient was initially seen in the emergency department on 09/08/2023 due to worsening perineal pain and general malaise.  The perineal abscess appeared to be present at that time, but he had no intervention. He returned to ED on 09/13/23 and found have large perineal abscess and sepsis.  Description of procedure: The risks and benefits of the procedure were discussed with the patient and his wife.  Informed consent was obtained by his wife at the bedside.  General surgery was consulted to evaluate the rectum.  The patient was taken from the ICU directly to the operating room.  He underwent placement of a central line by anesthesia.  Anesthesia was induced and patient had been receiving antibiotics previously.  He was positioned in the dorsolithotomy position and the previous packing was removed.  The  patient was then prepped and draped in a sterile fashion and a timeout was performed.  General surgery evaluated the rectum with a digital rectal exam.  There is felt to be no further intervention necessary and there was a clear division between the rectum and the abscess cavity.  The abscess cavity was probed and no further loculations were palpable.  The tissue planes were bleeding and did not appear necrotic.  There was a small amount of necrotic tissue on the anterior peritoneum which was excised with the Bovie.  Excision continued until planes are bleeding.  Copious irrigation with normal saline was performed.  The wound was then repacked with 2 inch iodoform gauze and a saline soaked Kerlix.  An ABD and mesh underwear were then placed on the patient.  The plan is for patient to return to the ICU intubated.   Plan:  Return to ICU intubated, plan to bedside dressing change over the weekend. Anticipate closing skin loosely in 1-2 weeks over testicles.

## 2023-09-14 NOTE — TOC Initial Note (Signed)
Transition of Care [facility]) - Initial/Assessment Note    Patient Details  Name: Evan Brock MRN: [id number] Date of Birth: 02-05-1951  [facility] CM/SW Contact:    Adrian Prows, RN Phone Number: 09/14/2023, 9:37 AM  Clinical Narrative:                 Spoke w/ pt and spouse Lucile Shutters in room; pt is from home; his wife plans for him to return at d/c; pt has PCP and insurance; they deny SDOH risks; pt has upper dentures;pt does not have DME, HH services, or home oxygen; TOC will follow.   Expected Discharge Plan: Home/Self Care Barriers to Discharge: Continued Medical Work up   Patient Goals and CMS Choice Patient states their goals for this hospitalization and ongoing recovery are:: pt will return home per his wife Lucile Shutters CMS Medicare.gov Compare Post Acute Care list provided to:: Patient        Expected Discharge Plan and Services   Discharge Planning Services: CM Consult   Living arrangements for the past 2 months: Single Family Home                                      Prior Living Arrangements/Services Living arrangements for the past 2 months: Single Family Home Lives with:: Spouse Patient language and need for interpreter reviewed:: Yes Do you feel safe going back to the place where you live?: Yes      Need for Family Participation in Patient Care: Yes (Comment) Care giver support system in place?: Yes (comment) Current home services:  (n/a) Criminal Activity/Legal Involvement Pertinent to Current Situation/Hospitalization: No - Comment as needed  Activities of Daily Living   ADL Screening (condition at time of admission) Independently performs ADLs?: Yes (appropriate for developmental age) Is the patient deaf or have difficulty hearing?: No Does the patient have difficulty seeing, even when wearing glasses/contacts?: No Does the patient have difficulty concentrating, remembering, or making decisions?: No  Permission  Sought/Granted Permission sought to share information with : Case Manager Permission granted to share information with : Yes, Verbal Permission Granted  Share Information with NAME: Case Manager     Permission granted to share info w Relationship: Lucile Shutters (wife) [phone]     Emotional Assessment Appearance:: Appears stated age Attitude/Demeanor/Rapport: Gracious Affect (typically observed): Accepting Orientation: : Oriented to Self, Oriented to Place, Oriented to  Time, Oriented to Situation Alcohol / Substance Use: Not Applicable Psych Involvement: No (comment)  Admission diagnosis:  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] Septic shock (HCC) [A41.9, R65.21] Patient Active Problem List   Diagnosis Date Noted   Septic shock (HCC) 09/13/2023   Fournier's 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   Acute hypoxic respiratory failure (HCC) 09/08/2022   Status post coronary artery stent placement    S/P PTCA (percutaneous transluminal coronary angioplasty)    Non-insulin dependent type 2 diabetes mellitus (HCC)    Mixed hyperlipidemia    Coronary atherosclerosis due to calcified coronary lesion    Former smoker    Class 1 obesity due to excess calories with serious comorbidity and body mass index (BMI) of 31.0 to 31.9 in adult    Non-ST elevation (NSTEMI) myocardial infarction (HCC) 02/16/2022   NSTEMI (non-ST elevated myocardial infarction) (HCC) 02/16/2022   Acute respiratory failure with hypoxia (HCC) 11/12/2021   CAP (community acquired pneumonia) 02/16/2021   Hypoxia 02/16/2021  COPD with acute exacerbation (HCC) 02/16/2021   Type 2 diabetes mellitus with obesity (HCC) 02/16/2021   AKI (acute kidney injury) (HCC) 02/16/2021   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   Leukocytosis 10/04/2015   COPD (chronic obstructive pulmonary disease) (HCC) 10/01/2015   Essential hypertension, benign 03/01/2015   Asthma with acute exacerbation 01/26/2015    Thrombocytopenia, unspecified (HCC) 11/06/2013   Acute respiratory failure (HCC) with hypoxia 10/01/2013   Acute renal failure (HCC) 10/01/2013   PCP:  Bettey Costa, NP Pharmacy:   [facility] [phone] - [place], [place] - [address] AT [facility] [address] [place] [address] Phone: [phone] Fax: [phone]     Social Determinants of Health (SDOH) Social History: SDOH Screenings   Food Insecurity: No Food Insecurity (09/14/2023)  Housing: Low Risk  (09/14/2023)  Transportation Needs: No Transportation Needs (09/14/2023)  Utilities: Not At Risk (09/14/2023)  Alcohol Screen: Low Risk  (03/06/2023)  Depression (PHQ2-9): Low Risk  (03/06/2023)  Tobacco Use: Medium Risk (09/13/2023)   SDOH Interventions: Food Insecurity Interventions: Intervention Not Indicated, Inpatient TOC Housing Interventions: Intervention Not Indicated, Inpatient TOC Transportation Interventions: Intervention Not Indicated, Inpatient TOC Utilities Interventions: Intervention Not Indicated, Inpatient TOC   Readmission Risk Interventions    09/14/2023    9:35 AM  Readmission Risk Prevention Plan  Transportation Screening Complete  PCP or Specialist Appt within 3-5 Days Complete  HRI or Home Care Consult Complete  Social Work Consult for Recovery Care Planning/Counseling Complete  Palliative Care Screening Not Applicable  Medication Review Oceanographer) Complete

## 2023-09-14 NOTE — Progress Notes (Signed)
An USGPIV (ultrasound guided PIV) has been placed for short-term vasopressor infusion. A correctly placed ivWatch must be used when administering vasopressors. Should this treatment be needed beyond 72 hours, central line access should be obtained.  It will be the responsibility of the bedside nurse to follow best practice to prevent extravasations.   

## 2023-09-14 NOTE — Anesthesia Preprocedure Evaluation (Addendum)
Anesthesia Evaluation  Patient identified by MRN, date of birth, ID band Patient confused    Reviewed: Allergy & Precautions, NPO status , Patient's Chart, lab work & pertinent test results  History of Anesthesia Complications Negative for: history of anesthetic complications  Airway Mallampati: Unable to assess       Dental  (+) Missing   Pulmonary shortness of breath, asthma , COPD,  COPD inhaler, former smoker   Pulmonary exam normal        Cardiovascular hypertension, Pt. on medications and Pt. on home beta blockers + CAD, + Past MI and + Cardiac Stents  Normal cardiovascular exam+ Valvular Problems/Murmurs MR      Neuro/Psych    GI/Hepatic negative GI ROS,,,(+)     substance abuse    Endo/Other  diabetes, Type 2, Oral Hypoglycemic Agents   Obesity   Renal/GU CRFRenal disease    Prostate cancer     Musculoskeletal  (+) Arthritis , Osteoarthritis,    Abdominal  (+) + obese  Peds  Hematology  (+) Blood dyscrasia, anemia  On brilinta Plt 145k    Anesthesia Other Findings  PERINEAL DEBRIDEMENT  Reproductive/Obstetrics                             Anesthesia Physical Anesthesia Plan  ASA: 4  Anesthesia Plan: General   Post-op Pain Management:    Induction: Intravenous  PONV Risk Score and Plan: 2 and Treatment may vary due to age or medical condition, Ondansetron and Dexamethasone  Airway Management Planned: Oral ETT  Additional Equipment: CVP and Ultrasound Guidance Line Placement  Intra-op Plan:   Post-operative Plan: Post-operative intubation/ventilation  Informed Consent: I have reviewed the patients History and Physical, chart, labs and discussed the procedure including the risks, benefits and alternatives for the proposed anesthesia with the patient or authorized representative who has indicated his/her understanding and acceptance.     Consent reviewed  with POA  Plan Discussed with: CRNA, Anesthesiologist and Surgeon  Anesthesia Plan Comments: (Anesthetic plan discussed with CCM MD, spouse, and surgeon at the bedside in the ICU)        Anesthesia Quick Evaluation

## 2023-09-14 NOTE — Op Note (Signed)
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 after undergoing incision and drainage of perineal, perirectal and scrotal abscess yesterday evening.  General surgery team requested for assessment of rectal involvement.  The patient is under general anesthesia and has been placed in the dorsolithotomy position.  The perineum has been prepped and draped in usual sterile fashion.  On initial inspection of the perineal wound, this involves the perineal body and anterior to this and the wound bed actually appears quite clean with no overtly necrotic or purulent tissue.  Digital rectal exam performed and no communication between the debrided rectum and the wound.  More cephalad, about 2 cm cephalad from the highest extent of the wound in the anterior perineum there is an approximately 1 cm defect in the anterior rectum, unclear if full-thickness but seems to be just behind the level of the prostate.  No purulent drainage from the rectum or in the rectal vault.  At this point the case is turned back over to [name], please see her separate operative report.   All counts were correct at the completion of the case.

## 2023-09-14 NOTE — Progress Notes (Signed)
Found PT off BiPAP at 1100- does not appear to be in respiratory distress at this time and is alert and orientated. PT agrees to notify staff if he wants to nap at anytime. RN aware.

## 2023-09-14 NOTE — Progress Notes (Signed)
  Echocardiogram 2D Echocardiogram has been performed.  Evan Brock 09/14/2023, 8:49 AM

## 2023-09-14 NOTE — Transfer of Care (Signed)
Immediate Anesthesia Transfer of Care Note  Patient: Evan Brock  Procedure(s) Performed: PERINEAL IRRIGATION AND DEBRIDEMENT, WOUND DRESSING CHANGE  Patient Location: ICU  Anesthesia Type:General  Level of Consciousness: sedated and Patient remains intubated per anesthesia plan  Airway & Oxygen Therapy: Patient remains intubated per anesthesia plan and Patient placed on Ventilator (see vital sign flow sheet for setting)  Post-op Assessment: Report given to RN and Post -op Vital signs reviewed and stable  Post vital signs: Reviewed and stable  Last Vitals:  Vitals Value Taken Time  BP 138/54 09/14/23 1640  Temp    Pulse 68 09/14/23 1644  Resp    SpO2 100 % 09/14/23 1644  Vitals shown include unfiled device data.  Last Pain:  Vitals:   09/14/23 1347  TempSrc:   PainSc: Asleep      Patients Stated Pain Goal: 3 (09/14/23 1100)  Complications: No notable events documented.

## 2023-09-14 NOTE — Progress Notes (Signed)
Notified Lab that ABG being sent for analysis. 

## 2023-09-14 NOTE — Progress Notes (Signed)
NAME:  Evan Brock, MRN:  [id number], DOB:  1951/10/17, LOS: 1 ADMISSION DATE:  09/13/2023, CONSULTATION DATE:  11/21  REFERRING MD:   CHIEF COMPLAINT:  Near Syncope   History of Present Illness:  Pt is a 72 yr old male with pmh of HTN, CAD (on Brilinta), Type 2 DM (Metformin), COPD, bronchiectasis, high grade prostate cancer with recent Gold seed implant and SpaceOAR gel placement for radiation treatment on 08/14/23, asbestos exposure/chemical exposure by working in [employer] for a total of 51yrs, former smoker tobacco for a total of 8 yrs (half a pack a day), and recently quit smoking marijuana over 2 years ago.  Patient originally presented to [facility] ED on 11/16 for testicular/perianal pain. At that time, patient had been endorsing that this pain and discomfort had been ongoing for at least 2 weeks. ED workup included a CT scan of abdomen/pelvis possible perianal abnormality without presence of abscess. Patient case was discussed with general surgery who recommended outpatient antibiotics with follow-up.  Patient returned to [facility] on 11/21 with near syncopal event. Per ED report patient began feeling dizziness and lightheaded on 11/20 and upon waking this morning felt lightheaded/dizzy. During the initial ED workup, patient presented with tachycardia, and hypotension with systolic BP within the low 70s, which was responsive with 2 LR fluid boluses. Initial lab work showed AKI with creatinine 2.72 and BUN 44, hgb 10.4, lactic acid within normal limits. Patient also was able to endorse that his perineum had a greater amount of swelling and pain. Upon assessment of perineum, there was an abscess measuring 2-3cm with purulent and serosanguineous fluid. 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 PCCM consulted for management of septic shock.   Pertinent  Medical History   has a past medical history of  Anticoagulant long-term use, Benign localized prostatic hyperplasia with lower urinary tract symptoms (LUTS), Chronic constipation, Chronic pain syndrome, COPD (chronic obstructive pulmonary disease) (HCC), Coronary artery disease (01/2022), DDD (degenerative disc disease), cervical, DDD (degenerative disc disease), lumbosacral, ED (erectile dysfunction), Exertional shortness of breath, History of acute respiratory failure, History of asbestos exposure, History of non-ST elevation myocardial infarction (NSTEMI) (02/16/2022), Hyperlipidemia, mixed, Hypertension, Malignant neoplasm prostate (HCC) (12/2022), Moderate persistent asthma, OA (osteoarthritis), S/P drug eluting coronary stent placement (02/17/2023), Type 2 diabetes mellitus (HCC), and Wears partial dentures.   Significant Hospital Events: Including procedures, antibiotic start and stop dates in addition to other pertinent events   11/21 Admit for Septic shock secondary to Fournier's Gangrene, responsive to LR boluses  11/21 S/p I&D  Interim History / Subjective:  Arrived post-op on peripheral levophed Drowsy this am and reports that he may have had a dx of OSA in the past Objective   Blood pressure (!) 89/54, pulse 86, temperature 97.8 F (36.6 C), temperature source Oral, resp. rate 20, height 5\' 9"  (1.753 m), weight 94 kg, SpO2 94%.        Intake/Output Summary (Last 24 hours) at 09/14/2023 0721 Last data filed at 09/14/2023 0651 Gross per 24 hour  Intake 4468.82 ml  Output 1350 ml  Net 3118.82 ml   Filed Weights   09/13/23 1015 09/13/23 2100 09/14/23 0522  Weight: 94.8 kg 93.9 kg 94 kg   Physical Exam: General: Well-appearing, no acute distress HENT: , AT, OP clear, MMM Eyes: EOMI, no scleral icterus Respiratory: Clear to auscultation bilaterally.  No crackles, wheezing or rales Cardiovascular: RRR, -M/R/G, no JVD GI: BS+, soft, nontender Extremities:-Edema,-tenderness Neuro:  AAO x4, CNII-XII grossly intact Skin:  Intact, no rashes or bruising Psych: Normal mood, normal affect GU: Foley in place  Resolved Hospital Problem list   N/a   Assessment & Plan:  #Sepsis shock secondary Fournier's  --S/p I&D 11/21. Likely repeat debridement in the future --Post-op care per surgical team --Continue broad spectrum antibiotics --F/u cultures --Wean vasopressors for MAP >65 --Hold anti-hypertensive agents    #Prostate cancer Holding planned for radiation    #Bronchiectasis with chronic mucoid impaction #COPD #Suspected OSA - ETCO2 in the 60-70s --On Trelegy at home. Breo and Incruse while inpatient --PRN nebs --ABG --BiPAP PRN and nightly while inpatient. Will need outpatient sleep study   #AKI --Monitor UOP/Cr --Avoid nephtoxic agents   #CAD #HTN --Hold antihypertensive agents --Hold Brilinta --Crestor   #DM2 - on metformin at home --Trend CBG --SSI  Best Practice (right click and "Reselect all SmartList Selections" daily)   Diet/type: NPO DVT prophylaxis: heparin injection 5,000 Units Start: 09/13/23 2200   Pressure ulcer(s): not present on admission  GI prophylaxis: PPI Lines: N/A Foley:  N/A Code Status:  full code  Last date of multidisciplinary goals of care discussion [ updated patient and family with MD Everardo All on 11/21]   Critical care time: 45 mins    The patient is critically ill with multiple organ systems failure and requires high complexity decision making for assessment and support, frequent evaluation and titration of therapies, application of advanced monitoring technologies and extensive interpretation of multiple databases.  Independent Critical Care Time: 45 Minutes.   Mechele Collin, M.D. [facility] Pulmonary/Critical Care Medicine 09/14/2023 7:21 AM   Please see Amion for pager number to reach on-call Pulmonary and Critical Care Team.

## 2023-09-14 NOTE — Anesthesia Procedure Notes (Signed)
Procedure Name: Intubation Date/Time: 09/14/2023 3:27 PM  Performed by: Garth Bigness, CRNAPre-anesthesia Checklist: Patient identified, Emergency Drugs available, Suction available and Patient being monitored Patient Re-evaluated:Patient Re-evaluated prior to induction Oxygen Delivery Method: Circle system utilized Preoxygenation: Pre-oxygenation with 100% oxygen Induction Type: IV induction Ventilation: Mask ventilation without difficulty Laryngoscope Size: Mac and 4 Grade View: Grade II Tube type: Oral Number of attempts: 1 Airway Equipment and Method: Stylet Placement Confirmation: ETT inserted through vocal cords under direct vision, positive ETCO2 and breath sounds checked- equal and bilateral Secured at: 24 cm Tube secured with: Tape Dental Injury: Teeth and Oropharynx as per pre-operative assessment

## 2023-09-14 NOTE — Anesthesia Postprocedure Evaluation (Signed)
Anesthesia Post Note  Patient: Evan Brock  Procedure(s) Performed: PERINEAL IRRIGATION AND DEBRIDEMENT, WOUND DRESSING CHANGE     Patient location during evaluation: ICU Anesthesia Type: General Level of consciousness: patient remains intubated per anesthesia plan and sedated Pain management: pain level controlled Vital Signs Assessment: post-procedure vital signs reviewed and stable Respiratory status: patient remains intubated per anesthesia plan, patient on ventilator - see flowsheet for VS and respiratory function stable Cardiovascular status: stable Anesthetic complications: no   No notable events documented.  Last Vitals:  Vitals:   09/14/23 1450 09/14/23 1500  BP: 108/60 (!) 115/57  Pulse: 80 80  Resp: 14 20  Temp:    SpO2: 98% 100%    Last Pain:  Vitals:   09/14/23 1347  TempSrc:   PainSc: Asleep                 Shanda Howells

## 2023-09-14 NOTE — Anesthesia Procedure Notes (Signed)
Central Venous Catheter Insertion Performed by: Leonides Grills, MD, anesthesiologist Start/End11/22/2024 3:30 PM, 09/14/2023 3:50 PM Patient location: OR. Preanesthetic checklist: patient identified, IV checked, site marked, risks and benefits discussed, surgical consent, monitors and equipment checked, pre-op evaluation, timeout performed and anesthesia consent Position: Trendelenburg Patient sedated Hand hygiene performed , maximum sterile barriers used  and Seldinger technique used Catheter size: 8 Fr Total catheter length 16. Central line was placed.Double lumen Procedure performed using ultrasound guided technique. Ultrasound Notes:anatomy identified, needle tip was noted to be adjacent to the nerve/plexus identified, no ultrasound evidence of intravascular and/or intraneural injection and image(s) printed for medical record Attempts: 1 Following insertion, dressing applied, line sutured and Biopatch. Post procedure assessment: blood return through all ports, free fluid flow and no air  Patient tolerated the procedure well with no immediate complications.

## 2023-09-15 DIAGNOSIS — N179 Acute kidney failure, unspecified: Secondary | ICD-10-CM | POA: Diagnosis not present

## 2023-09-15 DIAGNOSIS — J479 Bronchiectasis, uncomplicated: Secondary | ICD-10-CM | POA: Diagnosis not present

## 2023-09-15 DIAGNOSIS — A419 Sepsis, unspecified organism: Secondary | ICD-10-CM | POA: Diagnosis not present

## 2023-09-15 DIAGNOSIS — R6521 Severe sepsis with septic shock: Secondary | ICD-10-CM | POA: Diagnosis not present

## 2023-09-15 LAB — GLUCOSE, CAPILLARY
Glucose-Capillary: 102 mg/dL — ABNORMAL HIGH (ref 70–99)
Glucose-Capillary: 110 mg/dL — ABNORMAL HIGH (ref 70–99)
Glucose-Capillary: 114 mg/dL — ABNORMAL HIGH (ref 70–99)
Glucose-Capillary: 117 mg/dL — ABNORMAL HIGH (ref 70–99)
Glucose-Capillary: 154 mg/dL — ABNORMAL HIGH (ref 70–99)
Glucose-Capillary: 154 mg/dL — ABNORMAL HIGH (ref 70–99)

## 2023-09-15 LAB — BASIC METABOLIC PANEL
Anion gap: 8 (ref 5–15)
BUN: 38 mg/dL — ABNORMAL HIGH (ref 8–23)
CO2: 19 mmol/L — ABNORMAL LOW (ref 22–32)
Calcium: 8.1 mg/dL — ABNORMAL LOW (ref 8.9–10.3)
Chloride: 108 mmol/L (ref 98–111)
Creatinine, Ser: 2.39 mg/dL — ABNORMAL HIGH (ref 0.61–1.24)
GFR, Estimated: 28 mL/min — ABNORMAL LOW (ref >60)
Glucose, Bld: 130 mg/dL — ABNORMAL HIGH (ref 70–99)
Potassium: 3.9 mmol/L (ref 3.5–5.1)
Sodium: 135 mmol/L (ref 135–145)

## 2023-09-15 LAB — CBC
HCT: 29.1 % — ABNORMAL LOW (ref 39.0–52.0)
Hemoglobin: 9.1 g/dL — ABNORMAL LOW (ref 13.0–17.0)
MCH: 26.1 pg (ref 26.0–34.0)
MCHC: 31.3 g/dL (ref 30.0–36.0)
MCV: 83.4 fL (ref 80.0–100.0)
Platelets: 164 10*3/uL (ref 150–400)
RBC: 3.49 MIL/uL — ABNORMAL LOW (ref 4.22–5.81)
RDW: 16.2 % — ABNORMAL HIGH (ref 11.5–15.5)
WBC: 8.1 10*3/uL (ref 4.0–10.5)
nRBC: 0 % (ref 0.0–0.2)

## 2023-09-15 LAB — PHOSPHORUS
Phosphorus: 2.5 mg/dL (ref 2.5–4.6)
Phosphorus: 2.3 mg/dL — ABNORMAL LOW (ref 2.5–4.6)

## 2023-09-15 LAB — TRIGLYCERIDES: Triglycerides: 436 mg/dL — ABNORMAL HIGH (ref <150)

## 2023-09-15 LAB — MAGNESIUM: Magnesium: 2.4 mg/dL (ref 1.7–2.4)

## 2023-09-15 MED ORDER — SODIUM CHLORIDE 0.9% FLUSH: 10.0000 mL | INTRAVENOUS | Status: DC | PRN

## 2023-09-15 MED ORDER — LINEZOLID 600 MG PO TABS
600.0000 mg | ORAL_TABLET | Freq: Two times a day (BID) | ORAL | Status: DC
  Administered 2023-09-15 – 2023-09-16 (×3): 600 mg
  Filled 2023-09-15 (×5): qty 1

## 2023-09-15 MED ORDER — SODIUM CHLORIDE 0.9% FLUSH
10.0000 mL | Freq: Two times a day (BID) | INTRAVENOUS | Status: DC
  Administered 2023-09-15 – 2023-09-16 (×4): 10 mL
  Administered 2023-09-17: 20 mL
  Administered 2023-09-18 – 2023-09-26 (×13): 10 mL

## 2023-09-15 MED ORDER — PIVOT 1.5 CAL PO LIQD
1000.0000 mL | ORAL | Status: DC
  Administered 2023-09-15: 1000 mL
  Filled 2023-09-15 (×4): qty 1000

## 2023-09-15 MED ORDER — NOREPINEPHRINE 4 MG/250ML-% IV SOLN
0.0000 ug/min | INTRAVENOUS | Status: DC
  Administered 2023-09-15: 3 ug/min via INTRAVENOUS
  Filled 2023-09-15: qty 250

## 2023-09-15 MED ORDER — DEXMEDETOMIDINE HCL IN NACL 200 MCG/50ML IV SOLN
0.0000 ug/kg/h | INTRAVENOUS | Status: DC
  Administered 2023-09-15 – 2023-09-16 (×6): 0.4 ug/kg/h via INTRAVENOUS
  Filled 2023-09-15 (×6): qty 50

## 2023-09-15 MED ORDER — PROSOURCE TF20 ENFIT COMPATIBL EN LIQD
60.0000 mL | Freq: Every day | ENTERAL | Status: DC
  Administered 2023-09-15 – 2023-09-16 (×2): 60 mL
  Filled 2023-09-15 (×2): qty 60

## 2023-09-15 NOTE — Plan of Care (Signed)
  Problem: Metabolic: Goal: Ability to maintain appropriate glucose levels will improve Outcome: Progressing   Problem: Tissue Perfusion: Goal: Adequacy of tissue perfusion will improve Outcome: Progressing   Problem: Clinical Measurements: Goal: Respiratory complications will improve Outcome: Progressing   Problem: Coping: Goal: Level of anxiety will decrease Outcome: Progressing   Problem: Pain Management: Goal: General experience of comfort will improve Outcome: Progressing   Problem: Safety: Goal: Ability to remain free from injury will improve Outcome: Progressing   Problem: Activity: Goal: Risk for activity intolerance will decrease Outcome: Not Progressing   Problem: Nutrition: Goal: Adequate nutrition will be maintained Outcome: Not Progressing

## 2023-09-15 NOTE — Progress Notes (Signed)
1 Day Post-Op Subjective: Patient intubated and sedated.  Discussed the situation with Evan Brock.  Objective: Vital signs in last 24 hours: Temp:  [97.7 F (36.5 C)-98.3 F (36.8 C)] 98 F (36.7 C) (11/23 1200) Pulse Rate:  [45-81] 54 (11/23 1246) Resp:  [10-22] 20 (11/23 1246) BP: (84-199)/(49-89) 125/69 (11/23 1246) SpO2:  [94 %-100 %] 99 % (11/23 1246) FiO2 (%):  [30 %-50 %] 30 % (11/23 1139) Weight:  [97.9 kg] 97.9 kg (11/23 0500)  Intake/Output from previous day: 11/22 0701 - 11/23 0700 In: 2172.7 [I.V.:1730.1; NG/GT:100; IV Piggyback:312.6] Out: 2250 [Urine:2250] Intake/Output this shift: Total I/O In: 280.9 [I.V.:180.8; IV Piggyback:100.1] Out: 500 [Urine:500]  Physical Exam:  He is intubated and sedated GU-penis appears normal and healthy Foley catheter in place, urine clear.  Scrotum appears normal.  Scrotum was elevated in the Curlex was removed.  I then remove the iodoform packing from the abscess cavity.  The Kerlix and the iodoform are clean.  Minimal serosanguineous drainage.  The tissues appear viable and beefy red. The wound was then repacked with 2 inch iodoform gauze and a saline soaked Kerlix and then ABD placed on top.  Lab Results: Recent Labs    09/13/23 1040 09/14/23 0249 09/15/23 0500  HGB 10.4* 9.2* 9.1*  HCT 34.4* 31.3* 29.1*   BMET Recent Labs    09/14/23 0249 09/15/23 0500  NA 135 135  K 4.7 3.9  CL 107 108  CO2 19* 19*  GLUCOSE 140* 130*  BUN 42* 38*  CREATININE 3.30* 2.39*  CALCIUM 7.6* 8.1*   No results for input(s): "LABPT", "INR" in the last 72 hours. No results for input(s): "LABURIN" in the last 72 hours. Results for orders placed or performed during the hospital encounter of 09/13/23  Blood culture (routine x 2)     Status: None (Preliminary result)   Collection Time: 09/13/23  1:21 PM   Specimen: BLOOD  Result Value Ref Range Status   Specimen Description   Final    BLOOD RIGHT ANTECUBITAL Performed at [facility], [address] [place], [place] [address]    Special Requests   Final    BOTTLES DRAWN AEROBIC AND ANAEROBIC Blood Culture adequate volume Performed at [facility], [address] [place], [place] [address]    Culture   Final    NO GROWTH 2 DAYS Performed at [facility], [address] [place], [place] [address]    Report Status PENDING  Incomplete  Blood culture (routine x 2)     Status: None (Preliminary result)   Collection Time: 09/13/23  1:21 PM   Specimen: BLOOD  Result Value Ref Range Status   Specimen Description   Final    BLOOD LEFT ANTECUBITAL Performed at [facility], [address] [place], [place] [address]    Special Requests   Final    BOTTLES DRAWN AEROBIC AND ANAEROBIC Blood Culture adequate volume Performed at [facility], [address] [place], [place] [address]    Culture   Final    NO GROWTH 2 DAYS Performed at [name], [address], [place], [place] [address]    Report Status PENDING  Incomplete  Aerobic/Anaerobic Culture w Gram Stain (surgical/deep wound)     Status: None (Preliminary result)   Collection Time: 09/13/23  6:53 PM   Specimen: Wound; Abscess  Result Value Ref Range Status   Specimen Description   Final  WOUND Performed at [facility], [address], [place], [place] [address]    Special Requests   Final    PERINEAL ABSCESS Performed at [facility], [address] [place], [place] [address]    Gram Stain   Final    FEW WBC PRESENT, PREDOMINANTLY PMN FEW GRAM POSITIVE COCCI IN PAIRS FEW GRAM NEGATIVE RODS    Culture   Final    CULTURE REINCUBATED FOR BETTER GROWTH Performed at [facility], [address] [place], [place] [address]    Report Status PENDING  Incomplete    Studies/Results: DG Abd 1 View  Result Date: 09/14/2023 CLINICAL DATA:  Tube placement EXAM: ABDOMEN  - 1 VIEW limited for tube placement COMPARISON:  None Available. FINDINGS: Limited x-ray of the upper abdomen and thorax demonstrates enteric tube with the tip overlying the distal stomach. Please see separate chest x-ray IMPRESSION: Enteric tube with tip overlying the distal stomach.  Limited x-ray Electronically Signed   By: Karen Kays M.D.   On: 09/14/2023 18:32   DG CHEST PORT 1 VIEW  Result Date: 09/14/2023 CLINICAL DATA:  Line placement EXAM: PORTABLE CHEST 1 VIEW COMPARISON:  X-ray 06/06/2023. FINDINGS: Limited portable x-ray. The left lower thorax is clipped off the edge of the film. ET tube in place with tip seen proximally 3.8 cm above the carina. Enteric tube in place with the tip extending beneath the diaphragm. Right IJ line with the tip along the central SVC above the right atrium. Normal cardiopericardial silhouette when adjusting for level of inflation and technique. Calcified tortuous aorta. Basilar atelectasis. No pneumothorax or edema. Overlapping cardiac leads. Film is under penetrated. IMPRESSION: Limited x-ray. ET tube and enteric tube in place.  Right IJ line.  No pneumothorax. Basilar opacity. Electronically Signed   By: Karen Kays M.D.   On: 09/14/2023 18:31   ECHOCARDIOGRAM COMPLETE  Result Date: 09/14/2023    ECHOCARDIOGRAM REPORT   Patient Name:   Evan Brock Date of Exam: 09/14/2023 Medical Rec #:  [id number]    Height:       69.0 in Accession #:    [id number]   Weight:       207.2 lb Date of Birth:  Jun 02, 1951    BSA:          2.097 m Patient Age:    72 years     BP:           97/53 mmHg Patient Gender: M            HR:           86 bpm. Exam Location:  Inpatient Procedure: 2D Echo, Cardiac Doppler and Color Doppler Indications:    Shock  History:        Patient has prior history of Echocardiogram examinations, most                 recent 02/18/2022. Previous Myocardial Infarction and CAD, COPD,                 Signs/Symptoms:Shortness of Breath; Risk Factors:Diabetes,                  Hypertension, Dyslipidemia and Former Smoker. Hx of asbestos                 exposure, prostate CA, marijuana use.  Sonographer:    Milda Smart Referring Phys: [id number] Henrene Hawking  Sonographer Comments: Image acquisition challenging due to patient body  habitus and Image acquisition challenging due to respiratory motion. IMPRESSIONS  1. Left ventricular ejection fraction, by estimation, is 60 to 65%. The left ventricle has normal function. The left ventricle has no regional wall motion abnormalities. There is mild concentric left ventricular hypertrophy. Left ventricular diastolic parameters are indeterminate.  2. Right ventricular systolic function is normal. The right ventricular size is mildly enlarged. There is severely elevated pulmonary artery systolic pressure. The estimated right ventricular systolic pressure is 63.7 mmHg.  3. Right atrial size was mildly dilated.  4. The mitral valve was not well visualized. Mild to moderate mitral valve regurgitation. No evidence of mitral stenosis.  5. Tricuspid valve regurgitation is mild to moderate.  6. The aortic valve is tricuspid. There is mild calcification of the aortic valve. Aortic valve regurgitation is not visualized. Aortic valve sclerosis/calcification is present, without any evidence of aortic stenosis.  7. Aortic dilatation noted. There is mild dilatation of the ascending aorta, measuring 41 mm.  8. The inferior vena cava is dilated in size with >50% respiratory variability, suggesting right atrial pressure of 8 mmHg. Comparison(s): Changes from prior study are noted. RVSP increased from 53 mmHg to 63.7 mmHg. Conclusion(s)/Recommendation(s): Severely elevated PA pressures, dilated coronary sinus, otherwise largely unremarkable study. FINDINGS  Left Ventricle: Left ventricular ejection fraction, by estimation, is 60 to 65%. The left ventricle has normal function. The left ventricle has no regional wall motion abnormalities. The left  ventricular internal cavity size was normal in size. There is  mild concentric left ventricular hypertrophy. Left ventricular diastolic parameters are indeterminate. Right Ventricle: The right ventricular size is mildly enlarged. Right vetricular wall thickness was not well visualized. Right ventricular systolic function is normal. There is severely elevated pulmonary artery systolic pressure. The tricuspid regurgitant velocity is 3.73 m/s, and with an assumed right atrial pressure of 8 mmHg, the estimated right ventricular systolic pressure is 63.7 mmHg. Left Atrium: Left atrial size was normal in size. Right Atrium: Right atrial size was mildly dilated. Pericardium: Dilated coronary sinus. Trivial pericardial effusion is present. Mitral Valve: The mitral valve was not well visualized. Mild to moderate mitral valve regurgitation. No evidence of mitral valve stenosis. MV peak gradient, 4.8 mmHg. The mean mitral valve gradient is 3.0 mmHg. Tricuspid Valve: The tricuspid valve is grossly normal. Tricuspid valve regurgitation is mild to moderate. No evidence of tricuspid stenosis. Aortic Valve: The aortic valve is tricuspid. There is mild calcification of the aortic valve. Aortic valve regurgitation is not visualized. Aortic valve sclerosis/calcification is present, without any evidence of aortic stenosis. Aortic valve mean gradient measures 10.0 mmHg. Aortic valve peak gradient measures 19.0 mmHg. Aortic valve area, by VTI measures 3.04 cm. Pulmonic Valve: The pulmonic valve was grossly normal. Pulmonic valve regurgitation is not visualized. No evidence of pulmonic stenosis. Aorta: Aortic dilatation noted. There is mild dilatation of the ascending aorta, measuring 41 mm. Venous: The inferior vena cava is dilated in size with greater than 50% respiratory variability, suggesting right atrial pressure of 8 mmHg. IAS/Shunts: The interatrial septum was not well visualized.  LEFT VENTRICLE PLAX 2D LVIDd:         4.90 cm      Diastology LVIDs:         3.40 cm     LV e' medial:    5.77 cm/s LV PW:         1.40 cm     LV E/e' medial:  15.6 LV IVS:        1.30  cm     LV e' lateral:   12.30 cm/s LVOT diam:     2.40 cm     LV E/e' lateral: 7.3 LV SV:         107 LV SV Index:   51 LVOT Area:     4.52 cm  LV Volumes (MOD) LV vol d, MOD A2C: 73.1 ml LV vol d, MOD A4C: 76.1 ml LV vol s, MOD A2C: 28.7 ml LV vol s, MOD A4C: 27.9 ml LV SV MOD A2C:     44.4 ml LV SV MOD A4C:     76.1 ml LV SV MOD BP:      47.4 ml RIGHT VENTRICLE             IVC RV Basal diam:  4.02 cm     IVC diam: 2.30 cm RV Mid diam:    2.41 cm RV S prime:     17.10 cm/s TAPSE (M-mode): 2.3 cm LEFT ATRIUM             Index        RIGHT ATRIUM           Index LA diam:        2.90 cm 1.38 cm/m   RA Area:     19.90 cm LA Vol (A2C):   35.1 ml 16.73 ml/m  RA Volume:   58.40 ml  27.84 ml/m LA Vol (A4C):   46.1 ml 21.98 ml/m LA Biplane Vol: 39.0 ml 18.59 ml/m  AORTIC VALVE AV Area (Vmax):    2.74 cm AV Area (Vmean):   2.97 cm AV Area (VTI):     3.04 cm AV Vmax:           218.00 cm/s AV Vmean:          141.000 cm/s AV VTI:            0.353 m AV Peak Grad:      19.0 mmHg AV Mean Grad:      10.0 mmHg LVOT Vmax:         132.00 cm/s LVOT Vmean:        92.500 cm/s LVOT VTI:          0.237 m LVOT/AV VTI ratio: 0.67  AORTA Ao Root diam: 3.70 cm Ao Asc diam:  4.10 cm MITRAL VALVE                TRICUSPID VALVE MV Area (PHT): 3.76 cm     TR Peak grad:   55.7 mmHg MV Area VTI:   4.27 cm     TR Mean grad:   40.0 mmHg MV Peak grad:  4.8 mmHg     TR Vmax:        373.00 cm/s MV Mean grad:  3.0 mmHg     TR Vmean:       305.0 cm/s MV Vmax:       1.10 m/s MV Vmean:      80.6 cm/s    SHUNTS MV Decel Time: 202 msec     Systemic VTI:  0.24 m MR Peak grad: 83.5 mmHg     Systemic Diam: 2.40 cm MR Mean grad: 57.0 mmHg MR Vmax:      457.00 cm/s MR Vmean:     363.0 cm/s MV E velocity: 90.30 cm/s MV A velocity: 103.00 cm/s MV E/A ratio:  0.88 Jodelle Red MD Electronically signed by Jodelle Red MD Signature Date/Time: 09/14/2023/12:16:17 PM    Final  Assessment/Plan: Fournier's gangrene perineum/scrotum: -09/13/2023 s/p exploration, debridement and packing -09/14/2023 s/p minimal debridement, washout, General Surgery exam, dressing change -POD#2, 1 - Dressing changed without difficulty at bedside.  I was able to remove the iodoform packing and then repacked the cavity.  Covered with saline wet-to-dry Kerlix.  He is intubated and sedated and will do dressing change tomorrow.  No further debridement or washout required in OR at this point.   2. Prostate cancer: -pt received ADT June 2024 -suspend radiation treatment until abscess has healed   LOS: 2 days   Jerilee Field 09/15/2023, 1:24 PM

## 2023-09-15 NOTE — Progress Notes (Signed)
NAME:  Evan Brock, MRN:  [id number], DOB:  09/30/1951, LOS: 2 ADMISSION DATE:  09/13/2023, CONSULTATION DATE:  11/21  REFERRING MD:   CHIEF COMPLAINT:  Near Syncope   History of Present Illness:  Pt is a 72 yr old male with pmh of HTN, CAD (on Brilinta), Type 2 DM (Metformin), COPD, bronchiectasis, high grade prostate cancer with recent Gold seed implant and SpaceOAR gel placement for radiation treatment on 08/14/23, asbestos exposure/chemical exposure by working in [employer] for a total of 56yrs, former smoker tobacco for a total of 8 yrs (half a pack a day), and recently quit smoking marijuana over 2 years ago.  Patient originally presented to [facility] ED on 11/16 for testicular/perianal pain. At that time, patient had been endorsing that this pain and discomfort had been ongoing for at least 2 weeks. ED workup included a CT scan of abdomen/pelvis possible perianal abnormality without presence of abscess. Patient case was discussed with general surgery who recommended outpatient antibiotics with follow-up.  Patient returned to [facility] on 11/21 with near syncopal event. Per ED report patient began feeling dizziness and lightheaded on 11/20 and upon waking this morning felt lightheaded/dizzy. During the initial ED workup, patient presented with tachycardia, and hypotension with systolic BP within the low 70s, which was responsive with 2 LR fluid boluses. Initial lab work showed AKI with creatinine 2.72 and BUN 44, hgb 10.4, lactic acid within normal limits. Patient also was able to endorse that his perineum had a greater amount of swelling and pain. Upon assessment of perineum, there was an abscess measuring 2-3cm with purulent and serosanguineous fluid. 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 PCCM consulted for management of septic shock.   Pertinent  Medical History   has a past medical history of  Anticoagulant long-term use, Benign localized prostatic hyperplasia with lower urinary tract symptoms (LUTS), Chronic constipation, Chronic pain syndrome, COPD (chronic obstructive pulmonary disease) (HCC), Coronary artery disease (01/2022), DDD (degenerative disc disease), cervical, DDD (degenerative disc disease), lumbosacral, ED (erectile dysfunction), Exertional shortness of breath, History of acute respiratory failure, History of asbestos exposure, History of non-ST elevation myocardial infarction (NSTEMI) (02/16/2022), Hyperlipidemia, mixed, Hypertension, Malignant neoplasm prostate (HCC) (12/2022), Moderate persistent asthma, OA (osteoarthritis), S/P drug eluting coronary stent placement (02/17/2023), Type 2 diabetes mellitus (HCC), and Wears partial dentures.   Significant Hospital Events: Including procedures, antibiotic start and stop dates in addition to other pertinent events   11/21 Admit for Septic shock secondary to Fournier's Gangrene, responsive to LR boluses  11/21 S/p I&D 11/22 S/p redebridement  Interim History / Subjective:  Post-op returned on vent and on levophed 6 Had bradycardia however this resolved when weaned off propofol. Restarted propofol due to agitatoin Objective   Blood pressure 114/60, pulse (!) 50, temperature 98.3 F (36.8 C), temperature source Axillary, resp. rate 20, height 5\' 9"  (1.753 m), weight 97.9 kg, SpO2 99%.    Vent Mode: PRVC FiO2 (%):  [28 %-50 %] 30 % Set Rate:  [20 bmp] 20 bmp Vt Set:  [560 mL] 560 mL PEEP:  [5 cmH20] 5 cmH20 Plateau Pressure:  [15 cmH20-16 cmH20] 16 cmH20   Intake/Output Summary (Last 24 hours) at 09/15/2023 0809 Last data filed at 09/15/2023 1914 Gross per 24 hour  Intake 2129.98 ml  Output 2250 ml  Net -120.02 ml   Filed Weights   09/13/23 2100 09/14/23 0522 09/15/23 0500  Weight: 93.9 kg 94 kg 97.9 kg  Physical Exam: General: Well-appearing, no acute distress HENT: [place], AT, ETT in place Eyes: EOMI, no scleral  icterus Respiratory: Clear to auscultation bilaterally.  No crackles, wheezing or rales Cardiovascular: RRR, -M/R/G, no JVD GI: BS+, soft, nontender Extremities:-Edema,-tenderness Neuro: Agitated, moves extremities x 4 GU: Foley in place, dressing in place  Imaging, labs and test in EMR in the last 24 hours reviewed independently by me. Pertinent findings below: BUN/Cr 38/2.39 Trig 436 WBC 8.1  Resolved Hospital Problem list   N/a   Assessment & Plan:  #Sepsis shock secondary Fournier's  --S/p I&D 11/21, 11/23. Likely repeat debridement in the future --Post-op care per surgical team --Continue linzeolid, Cefepime and flagyl --F/u blood and wound cultures --Wean levophed for MAP >65 --Hold anti-hypertensive agents    #Prostate cancer Holding planned for radiation    #Bronchiectasis with chronic mucoid impaction #COPD #Suspected OSA - ETCO2 in the 60-70s. Will need outpatient sleep study --On Trelegy at home. --Full vent support --LTVV, 4-8cc/kg IBW with goal Pplat<30 and DP<15 --Daily WUA/SBT however hold extubation due to surgery --PRN nebs --VAP   #AKI - improving, good  UOP --Monitor UOP/Cr --Avoid nephtoxic agents   #CAD #HTN --Hold antihypertensive agents --Hold Brilinta --Crestor   #DM2 - on metformin at home --Trend CBG --SSI --Start TF once he returns from OR  [employer] (right click and "Reselect all SmartList Selections" daily)   Diet/type: NPO DVT prophylaxis: heparin injection 5,000 Units Start: 09/13/23 2200   Pressure ulcer(s): not present on admission  GI prophylaxis: PPI Lines: Central line Foley:  Yes, and it is still needed Code Status:  full code  Last date of multidisciplinary goals of care discussion [ 11/21 ] Full code. Updated wife at bedside on 4/22 with surgery  Critical care time: 46 mins    The patient is critically ill with multiple organ systems failure and requires high complexity decision making for assessment and  support, frequent evaluation and titration of therapies, application of advanced monitoring technologies and extensive interpretation of multiple databases.  Independent Critical Care Time: 46 Minutes.   Mechele Collin, M.D. [facility] Pulmonary/Critical Care Medicine 09/15/2023 8:09 AM   Please see Amion for pager number to reach on-call Pulmonary and Critical Care Team.

## 2023-09-16 DIAGNOSIS — J479 Bronchiectasis, uncomplicated: Secondary | ICD-10-CM | POA: Diagnosis not present

## 2023-09-16 DIAGNOSIS — N179 Acute kidney failure, unspecified: Secondary | ICD-10-CM | POA: Diagnosis not present

## 2023-09-16 DIAGNOSIS — R6521 Severe sepsis with septic shock: Secondary | ICD-10-CM | POA: Diagnosis not present

## 2023-09-16 DIAGNOSIS — A419 Sepsis, unspecified organism: Secondary | ICD-10-CM | POA: Diagnosis not present

## 2023-09-16 LAB — CBC
HCT: 30.7 % — ABNORMAL LOW (ref 39.0–52.0)
Hemoglobin: 9.7 g/dL — ABNORMAL LOW (ref 13.0–17.0)
MCH: 26.1 pg (ref 26.0–34.0)
MCHC: 31.6 g/dL (ref 30.0–36.0)
MCV: 82.7 fL (ref 80.0–100.0)
Platelets: 161 10*3/uL (ref 150–400)
RBC: 3.71 MIL/uL — ABNORMAL LOW (ref 4.22–5.81)
RDW: 15.9 % — ABNORMAL HIGH (ref 11.5–15.5)
WBC: 7.1 10*3/uL (ref 4.0–10.5)
nRBC: 0 % (ref 0.0–0.2)

## 2023-09-16 LAB — GLUCOSE, CAPILLARY
Glucose-Capillary: 133 mg/dL — ABNORMAL HIGH (ref 70–99)
Glucose-Capillary: 140 mg/dL — ABNORMAL HIGH (ref 70–99)
Glucose-Capillary: 105 mg/dL — ABNORMAL HIGH (ref 70–99)
Glucose-Capillary: 86 mg/dL (ref 70–99)
Glucose-Capillary: 114 mg/dL — ABNORMAL HIGH (ref 70–99)
Glucose-Capillary: 119 mg/dL — ABNORMAL HIGH (ref 70–99)

## 2023-09-16 LAB — MAGNESIUM: Magnesium: 2 mg/dL (ref 1.7–2.4)

## 2023-09-16 LAB — BASIC METABOLIC PANEL
Anion gap: 4 — ABNORMAL LOW (ref 5–15)
BUN: 32 mg/dL — ABNORMAL HIGH (ref 8–23)
CO2: 22 mmol/L (ref 22–32)
Calcium: 8.1 mg/dL — ABNORMAL LOW (ref 8.9–10.3)
Chloride: 107 mmol/L (ref 98–111)
Creatinine, Ser: 1.38 mg/dL — ABNORMAL HIGH (ref 0.61–1.24)
GFR, Estimated: 54 mL/min — ABNORMAL LOW (ref >60)
Glucose, Bld: 159 mg/dL — ABNORMAL HIGH (ref 70–99)
Potassium: 3.7 mmol/L (ref 3.5–5.1)
Sodium: 133 mmol/L — ABNORMAL LOW (ref 135–145)

## 2023-09-16 LAB — PHOSPHORUS
Phosphorus: 2.3 mg/dL — ABNORMAL LOW (ref 2.5–4.6)
Phosphorus: 2 mg/dL — ABNORMAL LOW (ref 2.5–4.6)

## 2023-09-16 MED ORDER — POLYETHYLENE GLYCOL 3350 17 G PO PACK: 17.0000 g | PACK | Freq: Every day | ORAL | Status: DC

## 2023-09-16 MED ORDER — CARMEX CLASSIC LIP BALM EX OINT: 1.0000 | TOPICAL_OINTMENT | CUTANEOUS | Status: DC | PRN

## 2023-09-16 MED ORDER — ACETAMINOPHEN 325 MG PO TABS
650.0000 mg | ORAL_TABLET | Freq: Four times a day (QID) | ORAL | Status: DC | PRN
  Administered 2023-09-16: 650 mg via ORAL
  Filled 2023-09-16: qty 2

## 2023-09-16 MED ORDER — FAMOTIDINE 20 MG PO TABS
20.0000 mg | ORAL_TABLET | Freq: Every day | ORAL | Status: DC
  Administered 2023-09-17: 20 mg via ORAL
  Filled 2023-09-16: qty 1

## 2023-09-16 MED ORDER — ORAL CARE MOUTH RINSE
15.0000 mL | OROMUCOSAL | Status: DC | PRN
Start: 2023-09-16 — End: 2023-09-17

## 2023-09-16 MED ORDER — DOCUSATE SODIUM 100 MG PO CAPS
100.0000 mg | ORAL_CAPSULE | Freq: Two times a day (BID) | ORAL | Status: DC
  Administered 2023-09-18 – 2023-09-27 (×17): 100 mg via ORAL
  Filled 2023-09-16 (×19): qty 1

## 2023-09-16 MED ORDER — LINEZOLID 600 MG PO TABS
600.0000 mg | ORAL_TABLET | Freq: Two times a day (BID) | ORAL | Status: DC
  Administered 2023-09-16 – 2023-09-19 (×6): 600 mg via ORAL
  Filled 2023-09-16 (×7): qty 1

## 2023-09-16 NOTE — Progress Notes (Signed)
S: Lightly sedated, intubated.  O: Vitals:   09/16/23 0835 09/16/23 1000  BP:  109/61  Pulse:  69  Resp:  14  Temp:    SpO2: 98% 97%   CBC    Component Value Date/Time   WBC 7.1 09/16/2023 0659   RBC 3.71 (L) 09/16/2023 0659   HGB 9.7 (L) 09/16/2023 0659   HGB 13.4 08/05/2019 0927   HCT 30.7 (L) 09/16/2023 0659   HCT 42.3 08/05/2019 0927   PLT 161 09/16/2023 0659   PLT 219 08/05/2019 0927   MCV 82.7 09/16/2023 0659   MCV 84 08/05/2019 0927   MCH 26.1 09/16/2023 0659   MCHC 31.6 09/16/2023 0659   RDW 15.9 (H) 09/16/2023 0659   RDW 14.1 08/05/2019 0927   LYMPHSABS 0.4 (L) 09/08/2023 0932   LYMPHSABS 1.3 08/05/2019 0927   MONOABS 0.7 09/08/2023 0932   EOSABS 0.2 09/08/2023 0932   EOSABS 0.2 08/05/2019 0927   BASOSABS 0.0 09/08/2023 0932   BASOSABS 0.1 08/05/2019 0927   Lab Results  Component Value Date   CREATININE 1.38 (H) 09/16/2023   CREATININE 2.39 (H) 09/15/2023   CREATININE 3.30 (H) 09/14/2023    PE: He was lightly sedated but responsive.  Intubated. His penis and scrotum appeared normal.  The posterior scrotal and perineal wound is healthy with red granulation and no necrosis.  The wet-to-dry packing was clean.  I remove the iodoform and it was clean.  The abscess cavity deep was repacked with 2 inch iodoform and a clean wet to dry Kerlix was placed over the wound.  An ABD pad was placed.  Assessment/Plan: Fournier's gangrene perineum/scrotum: -09/13/2023 s/p exploration, debridement and packing -09/14/2023 s/p minimal debridement, washout, General Surgery exam, dressing change -POD#3/2-Dressing changed without difficulty at bedside. No further debridement or washout required in OR at this point. He should be able to tolerate change at bedside. Extubation planned today.    2. Prostate cancer: -pt received ADT June 2024 -suspend radiation treatment until abscess has healed

## 2023-09-16 NOTE — Progress Notes (Signed)
NAME:  Evan Brock, MRN:  [id number], DOB:  08/01/1951, LOS: 3 ADMISSION DATE:  09/13/2023, CONSULTATION DATE:  11/21  REFERRING MD:   CHIEF COMPLAINT:  Near Syncope   History of Present Illness:  Pt is a 72 yr old male with pmh of HTN, CAD (on Brilinta), Type 2 DM (Metformin), COPD, bronchiectasis, high grade prostate cancer with recent Gold seed implant and SpaceOAR gel placement for radiation treatment on 08/14/23, asbestos exposure/chemical exposure by working in [employer] for a total of 61yrs, former smoker tobacco for a total of 8 yrs (half a pack a day), and recently quit smoking marijuana over 2 years ago.  Patient originally presented to [facility] ED on 11/16 for testicular/perianal pain. At that time, patient had been endorsing that this pain and discomfort had been ongoing for at least 2 weeks. ED workup included a CT scan of abdomen/pelvis possible perianal abnormality without presence of abscess. Patient case was discussed with general surgery who recommended outpatient antibiotics with follow-up.  Patient returned to [facility] on 11/21 with near syncopal event. Per ED report patient began feeling dizziness and lightheaded on 11/20 and upon waking this morning felt lightheaded/dizzy. During the initial ED workup, patient presented with tachycardia, and hypotension with systolic BP within the low 70s, which was responsive with 2 LR fluid boluses. Initial lab work showed AKI with creatinine 2.72 and BUN 44, hgb 10.4, lactic acid within normal limits. Patient also was able to endorse that his perineum had a greater amount of swelling and pain. Upon assessment of perineum, there was an abscess measuring 2-3cm with purulent and serosanguineous fluid. 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 PCCM consulted for management of septic shock.   Pertinent  Medical History   has a past medical history of  Anticoagulant long-term use, Benign localized prostatic hyperplasia with lower urinary tract symptoms (LUTS), Chronic constipation, Chronic pain syndrome, COPD (chronic obstructive pulmonary disease) (HCC), Coronary artery disease (01/2022), DDD (degenerative disc disease), cervical, DDD (degenerative disc disease), lumbosacral, ED (erectile dysfunction), Exertional shortness of breath, History of acute respiratory failure, History of asbestos exposure, History of non-ST elevation myocardial infarction (NSTEMI) (02/16/2022), Hyperlipidemia, mixed, Hypertension, Malignant neoplasm prostate (HCC) (12/2022), Moderate persistent asthma, OA (osteoarthritis), S/P drug eluting coronary stent placement (02/17/2023), Type 2 diabetes mellitus (HCC), and Wears partial dentures.   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 rate 20, height 5\' 9"  (1.753 m), weight 97.9 kg, SpO2 98%.    Vent Mode: CPAP;PSV FiO2 (%):  [30 %] 30 % Set Rate:  [20 bmp] 20 bmp Vt Set:  [560 mL] 560 mL PEEP:  [5 cmH20] 5 cmH20 Pressure Support:  [10 cmH20] 10 cmH20 Plateau Pressure:  [15 cmH20-19 cmH20] 15 cmH20   Intake/Output Summary (Last 24 hours) at 09/16/2023 0825 Last data filed at 09/16/2023 0751 Gross per 24 hour  Intake 1496.33 ml  Output 2275 ml  Net -778.67 ml   Filed Weights   09/13/23 2100 09/14/23 0522 09/15/23 0500  Weight: 93.9 kg 94 kg 97.9 kg   Physical Exam:  General: Well-appearing, no acute distress HENT: [place], AT, ETT in place Eyes: EOMI, no scleral  icterus Respiratory: Clear to auscultation bilaterally.  No crackles, wheezing or rales Cardiovascular: RRR, -M/R/G, no JVD GI: BS+, soft, nontender Extremities:-Edema,-tenderness Neuro: Awake and alert, follows commands, CNII-XII grossly intact GU: dressing place GU: Foley in place  Imaging, labs and test in EMR in the last 24 hours reviewed independently by me. Pertinent findings below:  BUN/Cr 32/1.38 improving Trig 436 WBC 7.1  Resolved Hospital Problem list   N/a   Assessment & Plan:  #Sepsis shock secondary Fournier's - improving --S/p I&D 11/21, 11/23. Likely repeat debridement in the future --Post-op care per surgical team --Continue linzeolid, Cefepime and flagyl --F/u blood and wound cultures --Wean levophed for MAP >65 --Hold anti-hypertensive agents    #Prostate cancer Holding planned for radiation    #Bronchiectasis with chronic mucoid impaction #COPD #Suspected OSA - ETCO2 in the 60-70s. Will need outpatient sleep study --On Trelegy at home --Full vent support --LTVV, 4-8cc/kg IBW with goal Pplat<30 and DP<15 --Daily WUA/SBT however hold extubation due to dressing change --PRN nebs --VAP   #AKI - improving, good UOP --Monitor UOP/Cr --Avoid nephtoxic agents   #CAD #HTN --Hold antihypertensive agents --Hold Brilinta --Crestor   #DM2 - on metformin at home --Trend CBG --SSI --Start TF once he returns from OR  [employer] (right click and "Reselect all SmartList Selections" daily)   Diet/type: tubefeeds DVT prophylaxis: heparin injection 5,000 Units Start: 09/13/23 2200   Pressure ulcer(s): not present on admission  GI prophylaxis: PPI Lines: Central line Foley:  Yes, and it is still needed Code Status:  full code  Last date of multidisciplinary goals of care discussion [ 11/21 ] Full code. Updated wife at bedside on 4/23 with surgery  Critical care time: 45 mins    The patient is critically ill with multiple organ systems failure and  requires high complexity decision making for assessment and support, frequent evaluation and titration of therapies, application of advanced monitoring technologies and extensive interpretation of multiple databases.  Independent Critical Care Time: 45 Minutes.   Mechele Collin, M.D. [facility] Pulmonary/Critical Care Medicine 09/16/2023 8:25 AM   Please see Amion for pager number to reach on-call Pulmonary and Critical Care Team.

## 2023-09-16 NOTE — Plan of Care (Signed)
  Problem: Fluid Volume: Goal: Ability to maintain a balanced intake and output will improve Outcome: Progressing   Problem: Metabolic: Goal: Ability to maintain appropriate glucose levels will improve Outcome: Progressing   Problem: Skin Integrity: Goal: Risk for impaired skin integrity will decrease Outcome: Progressing   Problem: Tissue Perfusion: Goal: Adequacy of tissue perfusion will improve Outcome: Progressing   Problem: Coping: Goal: Level of anxiety will decrease Outcome: Progressing

## 2023-09-16 NOTE — Procedures (Signed)
Extubation Procedure Note  Patient Details:   Name: Evan Brock DOB: [birth year] MRN: [id number]   Airway Documentation:    Vent end date: 09/16/23 Vent end time: 1150   Evaluation  O2 sats: stable throughout Complications: No apparent complications Patient did tolerate procedure well. Bilateral Breath Sounds: Clear, Diminished   Yes  Suzan Garibaldi 09/16/2023, 11:51 AM

## 2023-09-16 NOTE — Progress Notes (Signed)
Patient bowel movement caused iodoform dressing to become soiled. E[place]ridge MD with Urology paged, nursing to trim iodoform to remove soiled portion. Wound rinsed with saline and redressed.

## 2023-09-16 NOTE — Progress Notes (Signed)
Pharmacy Antibiotic Note  Evan Brock is a 72 y.o. male admitted on 09/13/2023 with  Fournier's gangrene .  Pharmacy has been consulted for cefepime dosing.  Patient empirically started on vancomycin, cefepime, Flagyl, and clindamycin on admission. Have since then narrowed to cefepime, Flagyl, linezolid. He is s/p I&D 11/21 and had a second debridement/washout on 11/22. Blood cultures remain NG x3 days. Wound cultures growing GPC in pairs and GNRs, but reincubated for better growth. Scr improved to 1.38 and CrCl ~55 ml/min.  Plan: Increase IV cefepime to 2g q12hrs  Continue IV Flagyl 500mg  q12hrs Continue PO linezolid 600mg  q12hrs Watch renal function, culture updates De-escalate abx as able  Height: 5\' 9"  (175.3 cm) Weight: 97.9 kg (215 lb 13.3 oz) IBW/kg (Calculated) : 70.7  Temp (24hrs), Avg:98.3 F (36.8 C), Min:97.9 F (36.6 C), Max:98.8 F (37.1 C)  Recent Labs  Lab 09/13/23 1036 09/13/23 1040 09/13/23 1327 09/14/23 0249 09/14/23 0939 09/15/23 0500 09/16/23 0659  WBC  --  10.2  --  11.6*  --  8.1 7.1  CREATININE  --  3.72*  --  3.30*  --  2.39* 1.38*  LATICACIDVEN 1.5  --  1.3  --  1.1  --   --     Estimated Creatinine Clearance: 55 mL/min (A) (by C-G formula based on SCr of 1.38 mg/dL (H)).    No Known Allergies  Antimicrobials this admission: 11/21 vanc >> 11/22 11/21 clindamycin >> 11/22 11/21 cefepime >> 11/21 Flagyl >> 11/23 linezolid >>  Dose adjustments this admission: 11/24: Cefepime 2g q24hrs >> 2g q12hrs   Microbiology results: 11/21 Bcx: NG x3 days 11/21 wound cx: GPC in pairs, GNRs - reincubated for better growth   Thank you for allowing pharmacy to be a part of this patient's care.  Cherylin Mylar, PharmD Clinical Pharmacist  11/24/20248:45 AM

## 2023-09-16 NOTE — Progress Notes (Signed)
Brief Nutrition Note  Consult received for enteral/tube feeding initiation and management.  Adult Enteral Nutrition Protocol initiated. PEPuP Titration 66ml/hr every 24hrs, prosource 60ml daily. Full assessment to follow.  OG vented/dual lumen 16 Fr.  tube in place with tip located in enteric  tube with the tip overlying the distal stomach  per xray imaging.   Admitting Dx: 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] Septic shock (HCC) [A41.9, R65.21]  Body mass index is 31.87 kg/m. Labs:  Recent Labs  Lab 09/14/23 0249 09/15/23 0500 09/15/23 1447 09/15/23 1809 09/16/23 0659  NA 135 135  --   --  133*  K 4.7 3.9  --   --  3.7  CL 107 108  --   --  107  CO2 19* 19*  --   --  22  BUN 42* 38*  --   --  32*  CREATININE 3.30* 2.39*  --   --  1.38*  CALCIUM 7.6* 8.1*  --   --  8.1*  MG 1.8 2.4  --   --  2.0  PHOS 5.0*  --  2.5 2.3* 2.3*  GLUCOSE 140* 130*  --   --  159*    Jamelle Haring RDN, LDN Clinical Dietitian  RDN pager # available on Amion

## 2023-09-17 ENCOUNTER — Ambulatory Visit: Payer: Medicare HMO

## 2023-09-17 ENCOUNTER — Encounter (HOSPITAL_COMMUNITY): Payer: Self-pay | Admitting: Urology

## 2023-09-17 DIAGNOSIS — N179 Acute kidney failure, unspecified: Secondary | ICD-10-CM | POA: Diagnosis not present

## 2023-09-17 DIAGNOSIS — A419 Sepsis, unspecified organism: Secondary | ICD-10-CM | POA: Diagnosis not present

## 2023-09-17 DIAGNOSIS — R6521 Severe sepsis with septic shock: Secondary | ICD-10-CM | POA: Diagnosis not present

## 2023-09-17 DIAGNOSIS — J479 Bronchiectasis, uncomplicated: Secondary | ICD-10-CM | POA: Diagnosis not present

## 2023-09-17 LAB — CBC
HCT: 31.1 % — ABNORMAL LOW (ref 39.0–52.0)
Hemoglobin: 9.6 g/dL — ABNORMAL LOW (ref 13.0–17.0)
MCH: 25.8 pg — ABNORMAL LOW (ref 26.0–34.0)
MCHC: 30.9 g/dL (ref 30.0–36.0)
MCV: 83.6 fL (ref 80.0–100.0)
Platelets: 151 10*3/uL (ref 150–400)
RBC: 3.72 MIL/uL — ABNORMAL LOW (ref 4.22–5.81)
RDW: 15.9 % — ABNORMAL HIGH (ref 11.5–15.5)
WBC: 6.9 10*3/uL (ref 4.0–10.5)
nRBC: 0 % (ref 0.0–0.2)

## 2023-09-17 LAB — BASIC METABOLIC PANEL
Anion gap: 6 (ref 5–15)
BUN: 27 mg/dL — ABNORMAL HIGH (ref 8–23)
CO2: 22 mmol/L (ref 22–32)
Calcium: 8.6 mg/dL — ABNORMAL LOW (ref 8.9–10.3)
Chloride: 112 mmol/L — ABNORMAL HIGH (ref 98–111)
Creatinine, Ser: 1.16 mg/dL (ref 0.61–1.24)
GFR, Estimated: >60 mL/min (ref >60)
Glucose, Bld: 116 mg/dL — ABNORMAL HIGH (ref 70–99)
Potassium: 3.3 mmol/L — ABNORMAL LOW (ref 3.5–5.1)
Sodium: 140 mmol/L (ref 135–145)

## 2023-09-17 LAB — GLUCOSE, CAPILLARY
Glucose-Capillary: 108 mg/dL — ABNORMAL HIGH (ref 70–99)
Glucose-Capillary: 94 mg/dL (ref 70–99)
Glucose-Capillary: 112 mg/dL — ABNORMAL HIGH (ref 70–99)
Glucose-Capillary: 114 mg/dL — ABNORMAL HIGH (ref 70–99)
Glucose-Capillary: 111 mg/dL — ABNORMAL HIGH (ref 70–99)
Glucose-Capillary: 122 mg/dL — ABNORMAL HIGH (ref 70–99)

## 2023-09-17 LAB — MAGNESIUM: Magnesium: 1.7 mg/dL (ref 1.7–2.4)

## 2023-09-17 MED ORDER — SODIUM CHLORIDE 0.9 % IV SOLN
3.0000 g | Freq: Four times a day (QID) | INTRAVENOUS | Status: DC
  Administered 2023-09-17 – 2023-09-26 (×35): 3 g via INTRAVENOUS
  Filled 2023-09-17 (×36): qty 8

## 2023-09-17 MED ORDER — ORAL CARE MOUTH RINSE: 15.0000 mL | OROMUCOSAL | Status: DC | PRN

## 2023-09-17 MED ORDER — POTASSIUM CHLORIDE CRYS ER 20 MEQ PO TBCR
20.0000 meq | EXTENDED_RELEASE_TABLET | ORAL | Status: AC
  Administered 2023-09-17 (×2): 20 meq via ORAL
  Filled 2023-09-17 (×2): qty 1

## 2023-09-17 MED ORDER — SODIUM CHLORIDE 0.9 % IV SOLN
2.0000 g | Freq: Three times a day (TID) | INTRAVENOUS | Status: DC
  Administered 2023-09-17: 2 g via INTRAVENOUS
  Filled 2023-09-17: qty 12.5

## 2023-09-17 MED ORDER — POTASSIUM CHLORIDE 10 MEQ/50ML IV SOLN
10.0000 meq | INTRAVENOUS | Status: AC
  Administered 2023-09-17 (×4): 10 meq via INTRAVENOUS
  Filled 2023-09-17 (×4): qty 50

## 2023-09-17 MED ORDER — HYDRALAZINE HCL 20 MG/ML IJ SOLN: 10.0000 mg | Freq: Four times a day (QID) | INTRAMUSCULAR | Status: DC | PRN

## 2023-09-17 MED ORDER — ORAL CARE MOUTH RINSE
15.0000 mL | OROMUCOSAL | Status: DC | PRN
Start: 2023-09-17 — End: 2023-09-17

## 2023-09-17 MED ORDER — UMECLIDINIUM BROMIDE 62.5 MCG/ACT IN AEPB
1.0000 | INHALATION_SPRAY | Freq: Every day | RESPIRATORY_TRACT | Status: DC
  Administered 2023-09-18 – 2023-09-27 (×10): 1 via RESPIRATORY_TRACT
  Filled 2023-09-17: qty 7

## 2023-09-17 MED ORDER — FLUTICASONE FUROATE-VILANTEROL 200-25 MCG/ACT IN AEPB
1.0000 | INHALATION_SPRAY | Freq: Every day | RESPIRATORY_TRACT | Status: DC
  Administered 2023-09-18 – 2023-09-27 (×10): 1 via RESPIRATORY_TRACT
  Filled 2023-09-17: qty 28

## 2023-09-17 MED ORDER — ENSURE ENLIVE PO LIQD
237.0000 mL | Freq: Two times a day (BID) | ORAL | Status: DC
  Administered 2023-09-18 – 2023-09-24 (×12): 237 mL via ORAL

## 2023-09-17 NOTE — Progress Notes (Signed)
Pharmacy Antibiotic Note  Evan Brock is a 72 y.o. male admitted on 09/13/2023 with  Fournier's gangrene .  Pharmacy has been consulted for cefepime dosing.  Patient empirically started on vancomycin, cefepime, Flagyl, and clindamycin on admission. Have since then narrowed to cefepime, Flagyl, linezolid. He is s/p I&D 11/21 and had a second debridement/washout on 11/22. Blood cultures remain NG x3 days. Wound cultures growing GPC in pairs and GNRs, but reincubated for better growth. Scr improved to 1.16 and CrCl ~65 ml/min.  Plan: Increase IV cefepime to 2g q8hrs  Continue IV Flagyl 500mg  q12hrs Continue PO linezolid 600mg  q12hrs Watch renal function, culture updates De-escalate abx as able  Height: 5\' 9"  (175.3 cm) Weight: 97.9 kg (215 lb 13.3 oz) IBW/kg (Calculated) : 70.7  Temp (24hrs), Avg:98.2 F (36.8 C), Min:97.9 F (36.6 C), Max:98.9 F (37.2 C)  Recent Labs  Lab 09/13/23 1036 09/13/23 1040 09/13/23 1327 09/14/23 0249 09/14/23 0939 09/15/23 0500 09/16/23 0659 09/17/23 0329  WBC  --  10.2  --  11.6*  --  8.1 7.1 6.9  CREATININE  --  3.72*  --  3.30*  --  2.39* 1.38* 1.16  LATICACIDVEN 1.5  --  1.3  --  1.1  --   --   --     Estimated Creatinine Clearance: 65.5 mL/min (by C-G formula based on SCr of 1.16 mg/dL).    No Known Allergies  Antimicrobials this admission: 11/21 vanc >> 11/22 11/21 clindamycin >> 11/22 11/21 cefepime >> 11/21 Flagyl >> 11/23 linezolid >>  Dose adjustments this admission: 11/24: Cefepime 2g q24hrs >> 2g q12hrs   Microbiology results: 11/21 Bcx: NG x3 days 11/21 wound cx: GPC in pairs, GNRs - reincubated for better growth   Adalberto Cole, PharmD, BCPS 09/17/2023 7:16 AM

## 2023-09-17 NOTE — Progress Notes (Signed)
Urology Inpatient Progress Report   Intv/Subj: No acute events overnight. He was extubated over the weekend and weaned off pressors. Transferred to the floor today.   Principal Problem:   Septic shock (HCC) Active Problems:   AKI (acute kidney injury) (HCC)   Fournier's gangrene  Current Facility-Administered Medications  Medication Dose Route Frequency Provider Last Rate Last Admin   acetaminophen (TYLENOL) tablet 650 mg  650 mg Oral Q6H PRN Luciano Cutter, MD   650 mg at 09/16/23 2011   Ampicillin-Sulbactam (UNASYN) 3 g in sodium chloride 0.9 % 100 mL IVPB  3 g Intravenous Q6H Luciano Cutter, MD 200 mL/hr at 09/17/23 1508 3 g at 09/17/23 1508   Chlorhexidine Gluconate Cloth 2 % PADS 6 each  6 each Topical Daily Luciano Cutter, MD   6 each at 09/16/23 1438   docusate sodium (COLACE) capsule 100 mg  100 mg Oral BID PRN Winter, Christopher Aaron, MD       docusate sodium (COLACE) capsule 100 mg  100 mg Oral BID Pricilla Riffle, RPH       [START ON 09/18/2023] feeding supplement (ENSURE ENLIVE / ENSURE PLUS) liquid 237 mL  237 mL Oral BID BM Luciano Cutter, MD       fluticasone furoate-vilanterol (BREO ELLIPTA) 200-25 MCG/ACT 1 puff  1 puff Inhalation Daily Luciano Cutter, MD       heparin injection 5,000 Units  5,000 Units Subcutaneous Q8H Rene Paci, MD   5,000 Units at 09/17/23 1502   hydrALAZINE (APRESOLINE) injection 10 mg  10 mg Intravenous Q6H PRN Luciano Cutter, MD       HYDROmorphone (DILAUDID) injection 1 mg  1 mg Intravenous Q3H PRN Paliwal, Eliezer Lofts, MD   1 mg at 09/17/23 0850   insulin aspart (novoLOG) injection 0-15 Units  0-15 Units Subcutaneous Q4H Rene Paci, MD   2 Units at 09/16/23 0754   ipratropium-albuterol (DUONEB) 0.5-2.5 (3) MG/3ML nebulizer solution 3 mL  3 mL Nebulization Q6H PRN Rene Paci, MD   3 mL at 09/13/23 2150   linezolid (ZYVOX) tablet 600 mg  600 mg Oral Q12H [place], Abby K, RPH   600 mg at  09/17/23 1416   lip balm (CARMEX) ointment 1 Application  1 Application Topical PRN Luciano Cutter, MD       Oral care mouth rinse  15 mL Mouth Rinse PRN Luciano Cutter, MD       oxyCODONE (Oxy IR/ROXICODONE) immediate release tablet 10 mg  10 mg Oral Q4H PRN Paliwal, Aditya, MD   10 mg at 09/17/23 0055   Or   oxyCODONE (Oxy IR/ROXICODONE) immediate release tablet 15 mg  15 mg Oral Q4H PRN Paliwal, Aditya, MD   15 mg at 09/17/23 1416   polyethylene glycol (MIRALAX / GLYCOLAX) packet 17 g  17 g Oral Daily PRN Winter, Christopher Aaron, MD       sodium chloride flush (NS) 0.9 % injection 10-40 mL  10-40 mL Intracatheter Q12H Luciano Cutter, MD   20 mL at 09/17/23 0948   sodium chloride flush (NS) 0.9 % injection 10-40 mL  10-40 mL Intracatheter PRN Luciano Cutter, MD       umeclidinium bromide (INCRUSE ELLIPTA) 62.5 MCG/ACT 1 puff  1 puff Inhalation Daily Luciano Cutter, MD         Objective: Vital: Vitals:   09/17/23 0800 09/17/23 0806 09/17/23 1045 09/17/23 1444  BP: (!) 160/80  (!) 157/87 Marland Kitchen)  148/87  Pulse: 82  84 80  Resp: (!) 21  18   Temp:  98.5 F (36.9 C) 99.2 F (37.3 C) 98 F (36.7 C)  TempSrc:  Axillary Oral Oral  SpO2: 100%  100% 99%  Weight:      Height:       I/Os: I/O last 3 completed shifts: In: 1310.3 [I.V.:472.2; NG/GT:538; IV Piggyback:300.1] Out: 3425 [Urine:3425]  Physical Exam:  General: Patient is in no apparent distress Lungs: Normal respiratory effort, chest expands symmetrically. Foley: draining clear yellow urine  Ext: lower extremities symmetric  Lab Results: Recent Labs    09/15/23 0500 09/16/23 0659 09/17/23 0329  WBC 8.1 7.1 6.9  HGB 9.1* 9.7* 9.6*  HCT 29.1* 30.7* 31.1*   Recent Labs    09/15/23 0500 09/16/23 0659 09/17/23 0329  NA 135 133* 140  K 3.9 3.7 3.3*  CL 108 107 112*  CO2 19* 22 22  GLUCOSE 130* 159* 116*  BUN 38* 32* 27*  CREATININE 2.39* 1.38* 1.16  CALCIUM 8.1* 8.1* 8.6*   No results for  input(s): "LABPT", "INR" in the last 72 hours. No results for input(s): "LABURIN" in the last 72 hours. Results for orders placed or performed during the hospital encounter of 09/13/23  Blood culture (routine x 2)     Status: None (Preliminary result)   Collection Time: 09/13/23  1:21 PM   Specimen: BLOOD  Result Value Ref Range Status   Specimen Description   Final    BLOOD RIGHT ANTECUBITAL Performed at [facility], [address] [place], [place] [address]    Special Requests   Final    BOTTLES DRAWN AEROBIC AND ANAEROBIC Blood Culture adequate volume Performed at [facility], [address] [place], [place] [address]    Culture   Final    NO GROWTH 4 DAYS Performed at [facility], [address], [place], [place] [address]    Report Status PENDING  Incomplete  Blood culture (routine x 2)     Status: None (Preliminary result)   Collection Time: 09/13/23  1:21 PM   Specimen: BLOOD  Result Value Ref Range Status   Specimen Description   Final    BLOOD LEFT ANTECUBITAL Performed at [facility], [address], [place], [place] [address]    Special Requests   Final    BOTTLES DRAWN AEROBIC AND ANAEROBIC Blood Culture adequate volume Performed at [facility], [address], [place], [place] [address]    Culture   Final    NO GROWTH 4 DAYS Performed at [facility], [address], [place], [place] [address]    Report Status PENDING  Incomplete  Aerobic/Anaerobic Culture w Gram Stain (surgical/deep wound)     Status: None (Preliminary result)   Collection Time: 09/13/23  6:53 PM   Specimen: Wound; Abscess  Result Value Ref Range Status   Specimen Description   Final    WOUND Performed at [facility], [address], [place], [place] [address]    Special Requests   Final    PERINEAL ABSCESS Performed at [facility], [address],  [place], [place] [address]    Gram Stain   Final    FEW WBC PRESENT, PREDOMINANTLY PMN FEW GRAM POSITIVE COCCI IN PAIRS FEW GRAM NEGATIVE RODS    Culture   Final    RARE ENTEROCOCCUS FAECALIS RARE CUTIBACTERIUM ACNES Standardized susceptibility testing for  this organism is not available. MODERATE BACTEROIDES THETAIOTAOMICRON BETA LACTAMASE POSITIVE REPEATING SUSCEPTIBILITIES FOR ENTERCOCCUS FAECALIS Performed at [facility], [address] [place], [place] [address]    Report Status PENDING  Incomplete    Studies/Results: No results found.  A/P: Fourniers gangrene perineum/scrotum:  -continue daily dressing changes  -anticipate closing scrotal skin in approx 1 week Prostate cancer  -pt received ADT in June 2024 (will be due for another injection December)    Kasandra Knudsen, MD Urology 09/17/2023, 4:57 PM

## 2023-09-17 NOTE — Progress Notes (Signed)
[facility] ADULT ICU REPLACEMENT PROTOCOL   The patient does apply for the [facility] Adult ICU Electrolyte Replacment Protocol based on the criteria listed below:   1.Exclusion criteria: TCTS, ECMO, Dialysis, and Myasthenia Gravis patients 2. Is GFR >/= 30 ml/min? Yes.    Patient's GFR today is > 60 3. Is SCr </= 2? Yes.   Patient's SCr is 1.16 mg/dL 4. Did SCr increase >/= 0.5 in 24 hours? No. 5.Pt's weight >40kg  Yes.   6. Abnormal electrolyte(s): K+ 3.3  7. Electrolytes replaced per protocol 8.  Call MD STAT for K+ </= 2.5, Phos </= 1, or Mag </= 1 Physician:  Dr Marigene Ehlers, Jettie Booze 09/17/2023 5:40 AM

## 2023-09-17 NOTE — Progress Notes (Signed)
NAME:  Evan Brock, MRN:  [id number], DOB:  1951/10/12, LOS: 4 ADMISSION DATE:  09/13/2023, CONSULTATION DATE:  11/21  REFERRING MD:   CHIEF COMPLAINT:  Near Syncope   History of Present Illness:  Pt is a 72 yr old male with pmh of HTN, CAD (on Brilinta), Type 2 DM (Metformin), COPD, bronchiectasis, high grade prostate cancer with recent Gold seed implant and SpaceOAR gel placement for radiation treatment on 08/14/23, asbestos exposure/chemical exposure by working in [employer] for a total of 69yrs, former smoker tobacco for a total of 8 yrs (half a pack a day), and recently quit smoking marijuana over 2 years ago.  Patient originally presented to [facility] ED on 11/16 for testicular/perianal pain. At that time, patient had been endorsing that this pain and discomfort had been ongoing for at least 2 weeks. ED workup included a CT scan of abdomen/pelvis possible perianal abnormality without presence of abscess. Patient case was discussed with general surgery who recommended outpatient antibiotics with follow-up.  Patient returned to [facility] on 11/21 with near syncopal event. Per ED report patient began feeling dizziness and lightheaded on 11/20 and upon waking this morning felt lightheaded/dizzy. During the initial ED workup, patient presented with tachycardia, and hypotension with systolic BP within the low 70s, which was responsive with 2 LR fluid boluses. Initial lab work showed AKI with creatinine 2.72 and BUN 44, hgb 10.4, lactic acid within normal limits. Patient also was able to endorse that his perineum had a greater amount of swelling and pain. Upon assessment of perineum, there was an abscess measuring 2-3cm with purulent and serosanguineous fluid. 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 PCCM consulted for management of septic shock.   Pertinent  Medical History   has a past medical history of  Anticoagulant long-term use, Benign localized prostatic hyperplasia with lower urinary tract symptoms (LUTS), Chronic constipation, Chronic pain syndrome, COPD (chronic obstructive pulmonary disease) (HCC), Coronary artery disease (01/2022), DDD (degenerative disc disease), cervical, DDD (degenerative disc disease), lumbosacral, ED (erectile dysfunction), Exertional shortness of breath, History of acute respiratory failure, History of asbestos exposure, History of non-ST elevation myocardial infarction (NSTEMI) (02/16/2022), Hyperlipidemia, mixed, Hypertension, Malignant neoplasm prostate (HCC) (12/2022), Moderate persistent asthma, OA (osteoarthritis), S/P drug eluting coronary stent placement (02/17/2023), Type 2 diabetes mellitus (HCC), and Wears partial dentures.   Significant Hospital Events: Including procedures, antibiotic start and stop dates in addition to other pertinent events   11/21 Admit for Septic shock secondary to Fournier's Gangrene, responsive to LR boluses  11/21 S/p I&D 11/22 S/p minimal redebridement/washout 11/23 dressing change 11/24 dressing changed 11/24 Extubated  Interim History / Subjective:  Dressing changed yesterday with Urology. No plans for further debridement or washout in OR  Extubated yesterday Tolerating diet  Objective   Blood pressure (!) 159/77, pulse 85, temperature 98.2 F (36.8 C), temperature source Oral, resp. rate 18, height 5\' 9"  (1.753 m), weight 97.9 kg, SpO2 99%.    Vent Mode: CPAP;PSV FiO2 (%):  [30 %] 30 % PEEP:  [5 cmH20] 5 cmH20 Pressure Support:  [10 cmH20] 10 cmH20   Intake/Output Summary (Last 24 hours) at 09/17/2023 0727 Last data filed at 09/17/2023 9381 Gross per 24 hour  Intake 1310.28 ml  Output 2525 ml  Net -1214.72 ml   Filed Weights   09/13/23 2100 09/14/23 0522 09/15/23 0500  Weight: 93.9 kg 94 kg 97.9 kg   Physical Exam: General: Well-appearing, no acute distress HENT: [place],  AT, OP clear, MMM Eyes: EOMI, no  scleral icterus Respiratory: Clear to auscultation bilaterally.  No crackles, wheezing or rales Cardiovascular: RRR, -M/R/G, no JVD GI: BS+, soft, nontender Extremities:-Edema,-tenderness Neuro: AAO x4, CNII-XII grossly intact Psych: Normal mood, normal affect GU: Foley in place, dressing in place  Imaging, labs and test in EMR in the last 24 hours reviewed independently by me. Pertinent findings below: K 3.3 BUN/Cr 27/1.16  Wound culture with few bacteroides  Resolved Hospital Problem list   N/a   Assessment & Plan:  #Sepsis shock secondary Fournier's - shock resolved --S/p I&D 11/21, 11/23, 11/24.  --Post op care per surgical team --Continue linzeolid, Cefepime and flagyl --F/u blood and wound cultures and sensitivities --Off levophed   #Prostate cancer Holding planned for radiation    #Bronchiectasis with chronic mucoid impaction #COPD #Suspected OSA - ETCO2 in the 60-70s. Will need outpatient sleep study --On Trelegy at home --Full vent support --LTVV, 4-8cc/kg IBW with goal Pplat<30 and DP<15 --Daily WUA/SBT however hold extubation due to dressing change --PRN nebs --VAP   #AKI - resolved --Monitor UOP/Cr --Avoid nephtoxic agents   #CAD #HTN --On losartan at home. Hold due to recent AKI --PRN anti-hypertensive agents --Hold Brilinta --Crestor   #DM2 - on metformin at home --Trend CBG --SSI  Best Practice (right click and "Reselect all SmartList Selections" daily)   Diet/type: Regular consistency (see orders) DVT prophylaxis: heparin injection 5,000 Units Start: 09/13/23 2200   Pressure ulcer(s): not present on admission  GI prophylaxis: N/A Lines: Central line Foley:  Yes, and it is still needed Code Status:  full code  Last date of multidisciplinary goals of care discussion [ 11/21 ] Full code. Updated wife at bedside on 4/23 with surgery  Critical care time: N/A   Care Time: 50 min Plan to transfer to [facility]  Mechele Collin, M.D. [facility]  Pulmonary/Critical Care Medicine 09/17/2023 7:27 AM   See Amion for personal pager For hours between 7 PM to 7 AM, please call Elink for urgent questions

## 2023-09-17 NOTE — Plan of Care (Signed)
  Problem: Coping: Goal: Ability to adjust to condition or change in health will improve Outcome: Progressing   Problem: Fluid Volume: Goal: Ability to maintain a balanced intake and output will improve Outcome: Progressing   Problem: Health Behavior/Discharge Planning: Goal: Ability to identify and utilize available resources and services will improve Outcome: Progressing Goal: Ability to manage health-related needs will improve Outcome: Progressing   Problem: Metabolic: Goal: Ability to maintain appropriate glucose levels will improve Outcome: Progressing   Problem: Nutritional: Goal: Maintenance of adequate nutrition will improve Outcome: Progressing Goal: Progress toward achieving an optimal weight will improve Outcome: Progressing   Problem: Skin Integrity: Goal: Risk for impaired skin integrity will decrease Outcome: Progressing   Problem: Tissue Perfusion: Goal: Adequacy of tissue perfusion will improve Outcome: Progressing   Problem: Education: Goal: Knowledge of General Education information will improve Description: Including pain rating scale, medication(s)/side effects and non-pharmacologic comfort measures Outcome: Progressing   Problem: Health Behavior/Discharge Planning: Goal: Ability to manage health-related needs will improve Outcome: Progressing   Problem: Clinical Measurements: Goal: Ability to maintain clinical measurements within normal limits will improve Outcome: Progressing Goal: Will remain free from infection Outcome: Progressing Goal: Diagnostic test results will improve Outcome: Progressing Goal: Respiratory complications will improve Outcome: Progressing Goal: Cardiovascular complication will be avoided Outcome: Progressing   Problem: Activity: Goal: Risk for activity intolerance will decrease Outcome: Progressing   Problem: Nutrition: Goal: Adequate nutrition will be maintained Outcome: Progressing   Problem: Coping: Goal:  Level of anxiety will decrease Outcome: Progressing   Problem: Elimination: Goal: Will not experience complications related to bowel motility Outcome: Progressing Goal: Will not experience complications related to urinary retention Outcome: Progressing   Problem: Pain Management: Goal: General experience of comfort will improve Outcome: Progressing   Problem: Safety: Goal: Ability to remain free from injury will improve Outcome: Progressing   Problem: Skin Integrity: Goal: Risk for impaired skin integrity will decrease Outcome: Progressing   Problem: Activity: Goal: Ability to tolerate increased activity will improve Outcome: Progressing   Problem: Respiratory: Goal: Ability to maintain a clear airway and adequate ventilation will improve Outcome: Progressing   Problem: Role Relationship: Goal: Method of communication will improve Outcome: Progressing

## 2023-09-17 NOTE — Plan of Care (Signed)
  Problem: Education: Goal: Ability to describe self-care measures that may prevent or decrease complications (Diabetes Survival Skills Education) will improve Outcome: Progressing   Problem: Coping: Goal: Ability to adjust to condition or change in health will improve Outcome: Progressing   Problem: Fluid Volume: Goal: Ability to maintain a balanced intake and output will improve Outcome: Progressing   Problem: Health Behavior/Discharge Planning: Goal: Ability to identify and utilize available resources and services will improve Outcome: Progressing Goal: Ability to manage health-related needs will improve Outcome: Progressing   Problem: Nutritional: Goal: Maintenance of adequate nutrition will improve Outcome: Progressing Goal: Progress toward achieving an optimal weight will improve Outcome: Progressing   Problem: Skin Integrity: Goal: Risk for impaired skin integrity will decrease Outcome: Progressing   Problem: Tissue Perfusion: Goal: Adequacy of tissue perfusion will improve Outcome: Progressing   Problem: Education: Goal: Knowledge of General Education information will improve Description: Including pain rating scale, medication(s)/side effects and non-pharmacologic comfort measures Outcome: Progressing   Problem: Clinical Measurements: Goal: Ability to maintain clinical measurements within normal limits will improve Outcome: Progressing Goal: Will remain free from infection Outcome: Progressing Goal: Diagnostic test results will improve Outcome: Progressing Goal: Respiratory complications will improve Outcome: Progressing Goal: Cardiovascular complication will be avoided Outcome: Progressing   Problem: Activity: Goal: Risk for activity intolerance will decrease Outcome: Progressing   Problem: Nutrition: Goal: Adequate nutrition will be maintained Outcome: Progressing   Problem: Coping: Goal: Level of anxiety will decrease Outcome: Progressing    Problem: Pain Management: Goal: General experience of comfort will improve Outcome: Progressing   Problem: Safety: Goal: Ability to remain free from injury will improve Outcome: Progressing   Problem: Skin Integrity: Goal: Risk for impaired skin integrity will decrease Outcome: Progressing   Problem: Activity: Goal: Ability to tolerate increased activity will improve Outcome: Progressing   Problem: Respiratory: Goal: Ability to maintain a clear airway and adequate ventilation will improve Outcome: Progressing   Problem: Role Relationship: Goal: Method of communication will improve Outcome: Progressing   Problem: Metabolic: Goal: Ability to maintain appropriate glucose levels will improve Outcome: Adequate for Discharge

## 2023-09-17 NOTE — Progress Notes (Addendum)
   3 Days Post-Op Subjective: I met with Evan Brock in the ICU for the first time since his surgery.  He was thankfully extubated and stable.  He was having difficulty finding his words but was alert and oriented.  No acute events overnight.  Plan to transfer to the floor today.  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 on 09/13/2023 Second look with [name].  Minimal debridement, washout, Gen surgery exam, and dressing change on 09/14/2023 POD #4/3.  1 mg of Dilaudid was given before beginning wound care and patient struggled significantly through physical exam and packing.  The wound was approximately 5 cm deep with no tracking noted.  This was repacked with iodoform gauze and covered with damp Kerlix.  Nursing to complete daily wound care.  Urology will assist with these as available.  Intake/Output from previous day: 11/24 0701 - 11/25 0700 In: 1310.3 [I.V.:472.2; NG/GT:538; IV Piggyback:300.1] Out: 2525 [Urine:2525]  Intake/Output this shift: Total I/O In: 348.8 [I.V.:20; IV Piggyback:328.8] Out: 250 [Urine:250]  Physical Exam:  General: Alert and oriented CV: No cyanosis Lungs: equal chest rise Abdomen: Soft, NTND, no rebound or guarding Gu: S/p surgical debridement of Fournier's gangrene of posterior scrotum and perineum. 5cm deep central perineal wound packed with iodoform gauze. Pink healthy wound bed without purulent drainage or signs of necrosis.   Lab Results: Recent Labs    09/15/23 0500 09/16/23 0659 09/17/23 0329  HGB 9.1* 9.7* 9.6*  HCT 29.1* 30.7* 31.1*   BMET Recent Labs    09/16/23 0659 09/17/23 0329  NA  133* 140  K 3.7 3.3*  CL 107 112*  CO2 22 22  GLUCOSE 159* 116*  BUN 32* 27*  CREATININE 1.38* 1.16  CALCIUM 8.1* 8.6*     Studies/Results: No results found.    LOS: 4 days   Elmon Kirschner, NP [facility] Pager: [phone]  09/17/2023, 10:03 AM

## 2023-09-17 NOTE — Progress Notes (Signed)
Pharmacy: Antimicrobial Stewardship Note  64 YOM who presented on 11/21 with testicular and perineal swelling x 1 month, concern for Fournier's gangrene. Now s/p OR 11/21 with intra-op cultures growing E faecalis, cutibacterium acnes, and bacteroides.  Recommending narrowing the Cefepime/Flagyl to Unasyn, keeping linezolid until enterococcus susceptibilities completed (typically amp-sensitive however noted micro repeating susceptibilities). Discussed with Dr. Everardo All who is agreeable to the above changes.  Thank you for allowing pharmacy to be a part of this patient's care.  Georgina Pillion, PharmD, BCPS, BCIDP Infectious Diseases Clinical Pharmacist 09/17/2023 2:04 PM   **Pharmacist phone directory can now be found on [email or web address] ([email or web address]).  Listed under [facility].

## 2023-09-18 ENCOUNTER — Inpatient Hospital Stay (HOSPITAL_COMMUNITY): Payer: Medicare HMO

## 2023-09-18 ENCOUNTER — Ambulatory Visit: Payer: Medicare HMO

## 2023-09-18 DIAGNOSIS — D649 Anemia, unspecified: Secondary | ICD-10-CM | POA: Diagnosis not present

## 2023-09-18 DIAGNOSIS — N493 Fournier gangrene: Secondary | ICD-10-CM | POA: Diagnosis not present

## 2023-09-18 DIAGNOSIS — K611 Rectal abscess: Secondary | ICD-10-CM

## 2023-09-18 DIAGNOSIS — I10 Essential (primary) hypertension: Secondary | ICD-10-CM | POA: Diagnosis not present

## 2023-09-18 DIAGNOSIS — R6521 Severe sepsis with septic shock: Secondary | ICD-10-CM | POA: Diagnosis not present

## 2023-09-18 DIAGNOSIS — A419 Sepsis, unspecified organism: Secondary | ICD-10-CM | POA: Diagnosis not present

## 2023-09-18 DIAGNOSIS — R112 Nausea with vomiting, unspecified: Secondary | ICD-10-CM | POA: Diagnosis not present

## 2023-09-18 LAB — AEROBIC/ANAEROBIC CULTURE W GRAM STAIN (SURGICAL/DEEP WOUND)

## 2023-09-18 LAB — CBC
HCT: 31 % — ABNORMAL LOW (ref 39.0–52.0)
Hemoglobin: 9.9 g/dL — ABNORMAL LOW (ref 13.0–17.0)
MCH: 26.3 pg (ref 26.0–34.0)
MCHC: 31.9 g/dL (ref 30.0–36.0)
MCV: 82.2 fL (ref 80.0–100.0)
Platelets: 166 10*3/uL (ref 150–400)
RBC: 3.77 MIL/uL — ABNORMAL LOW (ref 4.22–5.81)
RDW: 15.9 % — ABNORMAL HIGH (ref 11.5–15.5)
WBC: 6.3 10*3/uL (ref 4.0–10.5)
nRBC: 0 % (ref 0.0–0.2)

## 2023-09-18 LAB — BASIC METABOLIC PANEL
Anion gap: 8 (ref 5–15)
BUN: 22 mg/dL (ref 8–23)
CO2: 23 mmol/L (ref 22–32)
Calcium: 9.2 mg/dL (ref 8.9–10.3)
Chloride: 112 mmol/L — ABNORMAL HIGH (ref 98–111)
Creatinine, Ser: 1.05 mg/dL (ref 0.61–1.24)
GFR, Estimated: >60 mL/min (ref >60)
Glucose, Bld: 96 mg/dL (ref 70–99)
Potassium: 3.6 mmol/L (ref 3.5–5.1)
Sodium: 143 mmol/L (ref 135–145)

## 2023-09-18 LAB — CULTURE, BLOOD (ROUTINE X 2)
Culture: NO GROWTH
Culture: NO GROWTH
Special Requests: ADEQUATE
Special Requests: ADEQUATE

## 2023-09-18 LAB — GLUCOSE, CAPILLARY
Glucose-Capillary: 84 mg/dL (ref 70–99)
Glucose-Capillary: 88 mg/dL (ref 70–99)
Glucose-Capillary: 169 mg/dL — ABNORMAL HIGH (ref 70–99)
Glucose-Capillary: 122 mg/dL — ABNORMAL HIGH (ref 70–99)
Glucose-Capillary: 92 mg/dL (ref 70–99)

## 2023-09-18 MED ORDER — VITAMIN C 500 MG PO TABS
500.0000 mg | ORAL_TABLET | Freq: Two times a day (BID) | ORAL | Status: DC
  Administered 2023-09-18 – 2023-09-27 (×18): 500 mg via ORAL
  Filled 2023-09-18 (×18): qty 1

## 2023-09-18 MED ORDER — ZINC SULFATE 220 (50 ZN) MG PO CAPS
220.0000 mg | ORAL_CAPSULE | Freq: Every day | ORAL | Status: DC
  Administered 2023-09-18 – 2023-09-27 (×9): 220 mg via ORAL
  Filled 2023-09-18 (×9): qty 1

## 2023-09-18 MED ORDER — ONDANSETRON HCL 4 MG/2ML IJ SOLN: 4.0000 mg | Freq: Four times a day (QID) | INTRAMUSCULAR | Status: DC

## 2023-09-18 MED ORDER — PANTOPRAZOLE SODIUM 40 MG PO TBEC
40.0000 mg | DELAYED_RELEASE_TABLET | Freq: Two times a day (BID) | ORAL | Status: DC
  Administered 2023-09-18 – 2023-09-27 (×17): 40 mg via ORAL
  Filled 2023-09-18 (×17): qty 1

## 2023-09-18 MED ORDER — ONDANSETRON HCL 4 MG/2ML IJ SOLN
4.0000 mg | Freq: Four times a day (QID) | INTRAMUSCULAR | Status: DC | PRN
  Administered 2023-09-18: 4 mg via INTRAVENOUS
  Filled 2023-09-18: qty 2

## 2023-09-18 MED ORDER — PANTOPRAZOLE SODIUM 40 MG IV SOLR
40.0000 mg | Freq: Once | INTRAVENOUS | Status: AC
  Administered 2023-09-18: 40 mg via INTRAVENOUS
  Filled 2023-09-18: qty 10

## 2023-09-18 MED ORDER — ADULT MULTIVITAMIN W/MINERALS CH
1.0000 | ORAL_TABLET | Freq: Every day | ORAL | Status: DC
  Administered 2023-09-18 – 2023-09-27 (×9): 1 via ORAL
  Filled 2023-09-18 (×9): qty 1

## 2023-09-18 NOTE — Plan of Care (Signed)
  Problem: Clinical Measurements: Goal: Cardiovascular complication will be avoided Outcome: Progressing   Problem: Skin Integrity: Goal: Risk for impaired skin integrity will decrease Outcome: Progressing

## 2023-09-18 NOTE — Progress Notes (Signed)
[facility] PROGRESS NOTE   Karriem Pillar NGE:[id number] DOB: Jan 18, 1951 DOA: 09/13/2023  PCP: Bettey Costa, NP  Brief History:  72 yr old male with pmh of HTN, CAD (on Brilinta), Type 2 DM (Metformin), COPD, bronchiectasis, high grade prostate cancer with recent Gold seed implant and SpaceOAR gel placement for radiation treatment on 08/14/23, asbestos exposure/chemical exposure by working in [employer] for a total of 68yrs, former smoker tobacco for a total of 8 yrs (half a pack a day), and recently quit smoking marijuana over 2 years ago. Patient originally presented to [facility] ED on 11/16 for testicular/perianal pain.  Workup revealed possibility of Fournier's gangrene.  Urology was consulted.  Patient was admitted to the ICU for septic shock.  Stabilized.  Underwent surgery.  Subsequently transferred to the floor.    Consultants: Urology.  Critical care medicine  Procedures: Perineal/scrotal exploration, washout and debridement.    Subjective/Interval History: Informed by nursing staff that patient had 2 episodes of vomiting overnight.  Patient complains of upper abdominal discomfort.  Denies any chest pain or shortness of breath.  Somewhat slow to respond to questions.    Assessment/Plan:  Fournier's gangrene/septic shock Shock is resolved.  Patient was in the ICU for a few days. Seen by urology as well as general surgery.  Underwent I&D on 11/21, 08/2310/24. Management per surgical team. Patient noted to be on Unasyn and linezolid. Follow-up on culture data.  Surgical cultures revealed rare Enterococcus faecalis as well as rare cutibacterium acnes and MODERATE BACTEROIDES THETAIOTAOMICRON. Once final sensitivities are available we will need to involve ID to assist with antibiotic management.  Nausea and vomiting Abdomen seems to be mildly distended.  Will obtain abdominal films.  PPI.  Antiemetics.  History of prostate cancer Radiation currently on  hold.  Bronchiectasis/COPD/suspected OSA Will need outpatient sleep study.  Acute kidney injury Resolved.  Normocytic anemia No evidence of overt bleeding.  Coronary artery disease/essential hypertension Losartan on hold.  Holding Brilinta as well due to his surgeries.  Statin also on hold currently.  Monitor blood pressures closely.  Use as needed agents.  Diabetes mellitus type 2 On metformin at home.  SSI.  Monitor CBGs.  Obesity Estimated body mass index is 30.08 kg/m as calculated from the following:   Height as of this encounter: 5\' 9"  (1.753 m).   Weight as of this encounter: 92.4 kg.   DVT Prophylaxis: Subcutaneous heparin Code Status: Full code Family Communication: Discussed with patient Disposition Plan: To be determined.  PT and OT evaluation.    Medications: Scheduled:  Chlorhexidine Gluconate Cloth  6 each Topical Daily   docusate sodium  100 mg Oral BID   feeding supplement  237 mL Oral BID BM   fluticasone furoate-vilanterol  1 puff Inhalation Daily   heparin  5,000 Units Subcutaneous Q8H   insulin aspart  0-15 Units Subcutaneous Q4H   linezolid  600 mg Oral Q12H   pantoprazole  40 mg Oral BID AC   pantoprazole (PROTONIX) IV  40 mg Intravenous Once   sodium chloride flush  10-40 mL Intracatheter Q12H   umeclidinium bromide  1 puff Inhalation Daily   Continuous:  ampicillin-sulbactam (UNASYN) IV 3 g (09/18/23 0523)   MWN:[id number], docusate sodium, hydrALAZINE, HYDROmorphone (DILAUDID) injection, ipratropium-albuterol, lip balm, ondansetron (ZOFRAN) IV, mouth rinse, oxyCODONE **OR** oxyCODONE, polyethylene glycol, sodium chloride flush  Antibiotics: Anti-infectives (From admission, onward)    Start     Dose/Rate Route Frequency Ordered Stop   09/17/23 1600  Ampicillin-Sulbactam (UNASYN)  3 g in sodium chloride 0.9 % 100 mL IVPB        3 g 200 mL/hr over 30 Minutes Intravenous Every 6 hours 09/17/23 1406     09/17/23 0800  ceFEPIme (MAXIPIME) 2  g in sodium chloride 0.9 % 100 mL IVPB  Status:  Discontinued        2 g 200 mL/hr over 30 Minutes Intravenous Every 8 hours 09/17/23 0717 09/17/23 1406   09/16/23 1530  linezolid (ZYVOX) tablet 600 mg        600 mg Oral Every 12 hours 09/16/23 1441     09/15/23 1500  vancomycin (VANCOCIN) IVPB 1000 mg/200 mL premix  Status:  Discontinued        1,000 mg 200 mL/hr over 60 Minutes Intravenous Every 48 hours 09/13/23 2020 09/14/23 0931   09/15/23 0130  linezolid (ZYVOX) tablet 600 mg  Status:  Discontinued        600 mg Per Tube Every 12 hours 09/15/23 0116 09/16/23 1441   09/14/23 1400  ceFEPIme (MAXIPIME) 2 g in sodium chloride 0.9 % 100 mL IVPB  Status:  Discontinued        2 g 200 mL/hr over 30 Minutes Intravenous Every 24 hours 09/13/23 2020 09/17/23 0717   09/14/23 1330  linezolid (ZYVOX) tablet 600 mg  Status:  Discontinued        600 mg Oral Every 12 hours 09/14/23 0931 09/15/23 0115   09/13/23 2230  clindamycin (CLEOCIN) IVPB 600 mg  Status:  Discontinued        600 mg 100 mL/hr over 30 Minutes Intravenous Every 8 hours 09/13/23 2138 09/14/23 0931   09/13/23 2200  metroNIDAZOLE (FLAGYL) IVPB 500 mg  Status:  Discontinued        500 mg 100 mL/hr over 60 Minutes Intravenous Every 12 hours 09/13/23 2020 09/17/23 1406   09/13/23 1330  vancomycin (VANCOREADY) IVPB 2000 mg/400 mL        2,000 mg 200 mL/hr over 120 Minutes Intravenous  Once 09/13/23 1324 09/13/23 2125   09/13/23 1330  ceFEPIme (MAXIPIME) 2 g in sodium chloride 0.9 % 100 mL IVPB        2 g 200 mL/hr over 30 Minutes Intravenous  Once 09/13/23 1324 09/13/23 1426   09/13/23 1300  clindamycin (CLEOCIN) IVPB 300 mg        300 mg 100 mL/hr over 30 Minutes Intravenous  Once 09/13/23 1259 09/13/23 1352       Objective:  Vital Signs  Vitals:   09/18/23 0409 09/18/23 0700 09/18/23 0844 09/18/23 0916  BP: (!) 159/84  (!) 151/77   Pulse: 72  68   Resp: 17     Temp: 98.6 F (37 C)  99.2 F (37.3 C)   TempSrc: Oral   Oral   SpO2: 100%  100% 92%  Weight:  92.4 kg    Height:        Intake/Output Summary (Last 24 hours) at 09/18/2023 0919 Last data filed at 09/18/2023 0645 Gross per 24 hour  Intake 1752.07 ml  Output 1401 ml  Net 351.07 ml   Filed Weights   09/14/23 0522 09/15/23 0500 09/18/23 0700  Weight: 94 kg 97.9 kg 92.4 kg    General appearance: Awake alert.  In no distress Resp: Clear to auscultation bilaterally.  Normal effort Cardio: S1-S2 is normal regular.  No S3-S4.  No rubs murmurs or bruit GI: Abdomen is soft.  Mildly distended.  Tender in the epigastric area.  Bowel sounds sluggish.  No masses organomegaly. Extremities: No edema.  Moving all of his extremities but physical deconditioning is noted. Neurologic: No focal neurological deficits.    Lab Results:  Data Reviewed: I have personally reviewed following labs and reports of the imaging studies  CBC: Recent Labs  Lab 09/14/23 0249 09/15/23 0500 09/16/23 0659 09/17/23 0329 09/18/23 0508  WBC 11.6* 8.1 7.1 6.9 6.3  HGB 9.2* 9.1* 9.7* 9.6* 9.9*  HCT 31.3* 29.1* 30.7* 31.1* 31.0*  MCV 88.9 83.4 82.7 83.6 82.2  PLT 145* 164 161 151 166    Basic Metabolic Panel: Recent Labs  Lab 09/14/23 0249 09/15/23 0500 09/15/23 1447 09/15/23 1809 09/16/23 0659 09/16/23 1643 09/17/23 0329 09/18/23 0508  NA 135 135  --   --  133*  --  140 143  K 4.7 3.9  --   --  3.7  --  3.3* 3.6  CL 107 108  --   --  107  --  112* 112*  CO2 19* 19*  --   --  22  --  22 23  GLUCOSE 140* 130*  --   --  159*  --  116* 96  BUN 42* 38*  --   --  32*  --  27* 22  CREATININE 3.30* 2.39*  --   --  1.38*  --  1.16 1.05  CALCIUM 7.6* 8.1*  --   --  8.1*  --  8.6* 9.2  MG 1.8 2.4  --   --  2.0  --  1.7  --   PHOS 5.0*  --  2.5 2.3* 2.3* 2.0*  --   --     GFR: Estimated Creatinine Clearance: 70.4 mL/min (by C-G formula based on SCr of 1.05 mg/dL).  Liver Function Tests: Recent Labs  Lab 09/13/23 1040  AST 16  ALT 19  ALKPHOS 54   BILITOT 0.4  PROT 6.2*  ALBUMIN 2.9*    CBG: Recent Labs  Lab 09/17/23 1657 09/17/23 2022 09/17/23 2318 09/18/23 0409 09/18/23 0737  GLUCAP 114* 111* 122* 84 88      Recent Results (from the past 240 hour(s))  Blood culture (routine x 2)     Status: None   Collection Time: 09/13/23  1:21 PM   Specimen: BLOOD  Result Value Ref Range Status   Specimen Description   Final    BLOOD RIGHT ANTECUBITAL Performed at [facility], [address] [place], [place] [address]    Special Requests   Final    BOTTLES DRAWN AEROBIC AND ANAEROBIC Blood Culture adequate volume Performed at [facility], [address] [place], [place] [address]    Culture   Final    NO GROWTH 5 DAYS Performed at [facility], [address], [place], [place] [address]    Report Status 09/18/2023 FINAL  Final  Blood culture (routine x 2)     Status: None   Collection Time: 09/13/23  1:21 PM   Specimen: BLOOD  Result Value Ref Range Status   Specimen Description   Final    BLOOD LEFT ANTECUBITAL Performed at [facility], [address] [place], [place] [address]    Special Requests   Final    BOTTLES DRAWN AEROBIC AND ANAEROBIC Blood Culture adequate volume Performed at [facility], [address] [place], [place] [address]    Culture   Final    NO GROWTH 5 DAYS Performed at [facility], [address], [place], [place] [address]    Report Status 09/18/2023 FINAL  Final  Aerobic/Anaerobic Culture w Gram Stain (surgical/deep wound)     Status: None (Preliminary result)   Collection Time: 09/13/23  6:53 PM   Specimen: Wound; Abscess  Result Value Ref Range Status   Specimen Description   Final    WOUND Performed at [facility], [address] [place], [place] [address]    Special Requests   Final    PERINEAL ABSCESS Performed at [facility], [address] [place], [place] [address]    Gram Stain   Final    FEW WBC PRESENT, PREDOMINANTLY PMN FEW GRAM POSITIVE COCCI IN PAIRS FEW GRAM NEGATIVE RODS    Culture   Final    RARE ENTEROCOCCUS FAECALIS RARE CUTIBACTERIUM ACNES Standardized susceptibility testing for this organism is not available. MODERATE BACTEROIDES THETAIOTAOMICRON BETA LACTAMASE POSITIVE Performed at [facility], [address] [place], [place] [address]    Report Status PENDING  Incomplete   Organism ID, Bacteria ENTEROCOCCUS FAECALIS  Final      Susceptibility   Enterococcus faecalis - MIC*    AMPICILLIN <=2 SENSITIVE Sensitive     VANCOMYCIN 1 SENSITIVE Sensitive     GENTAMICIN SYNERGY RESISTANT Resistant     * RARE ENTEROCOCCUS FAECALIS      Radiology Studies: No results found.     LOS: 5 days   Beatrix Breece [employer] on [email or web address]  09/18/2023, 9:19 AM

## 2023-09-18 NOTE — Progress Notes (Signed)
OT Cancellation Note  Patient Details Name: Lynix Pitoniak MRN: [id number] DOB: 12-23-1950   Cancelled Treatment:    Reason Eval/Treat Not Completed: Other (comment) Patient just back to bed with nursing. OT to continue to follow and check back as schedule will allow.  Rosalio Loud, MS Acute Rehabilitation Department Office# [phone]  09/18/2023, 1:19 PM

## 2023-09-18 NOTE — TOC Progression Note (Addendum)
Transition of Care [facility]) - Progression Note    Patient Details  Name: Evan Brock MRN: [id number] Date of Birth: 1951/09/29  Transition of Care [facility]) CM/SW Contact  Howell Rucks, RN Phone Number: 09/18/2023, 1:00 PM  Clinical Narrative: PT eval completed, recommendation for [facility] PT, met with pt and spouse at bedside, agreeable, no preference. NCM will arrange. [employer]- rep-jermaine 2W RW to be delivered to bedside.     -1:25pm [facility], rep-Amy, accepted for [facility] PT, added to AVS.      Expected Discharge Plan: Home/Self Care Barriers to Discharge: Continued Medical Work up  Expected Discharge Plan and Services   Discharge Planning Services: CM Consult   Living arrangements for the past 2 months: Single Family Home                                       Social Determinants of Health (SDOH) Interventions SDOH Screenings   Food Insecurity: No Food Insecurity (09/14/2023)  Housing: Low Risk  (09/14/2023)  Transportation Needs: No Transportation Needs (09/14/2023)  Utilities: Not At Risk (09/14/2023)  Alcohol Screen: Low Risk  (03/06/2023)  Depression (PHQ2-9): Low Risk  (03/06/2023)  Tobacco Use: Medium Risk (09/14/2023)    Readmission Risk Interventions    09/14/2023    9:35 AM  Readmission Risk Prevention Plan  Transportation Screening Complete  PCP or Specialist Appt within 3-5 Days Complete  HRI or Home Care Consult Complete  Social Work Consult for Recovery Care Planning/Counseling Complete  Palliative Care Screening Not Applicable  Medication Review [employer]) Complete

## 2023-09-18 NOTE — Evaluation (Addendum)
Physical Therapy Evaluation Patient Details Name: Evan Brock MRN: [id number] DOB: May 15, 1951 Today's Date: 09/18/2023  History of Present Illness  72 yr old malepresented to [facility] ED on 11/16 for testicular/perianal pain. Patient was admitted to the ICU for septic shock. Workup revealed possibility of Fournier's gangrene.  s/p Exploration, debridement, and packing  on 09/13/2023.  Pt with pmh of HTN, CAD (on Brilinta), Type 2 DM (Metformin), COPD, bronchiectasis, high grade prostate cancer with recent Gold seed implant and SpaceOAR gel placement for radiation treatment on 08/14/23, asbestos exposure/chemical exposure by working in [employer] for a total of 63yrs, former smoker tobacco for a total of 8 yrs (half a pack a day), and recently quit smoking marijuana over 2 years ago.  Clinical Impression  Pt admitted with above diagnosis. Pt transferred bed to recliner with min assist. SpO2 99% on room air at rest, 93% on room air with activity.  Pt is independent with mobility at baseline. Today he was oriented to self only, his wife states this is not baseline. He was pleasant, and was able to follow 1 step commands. Encouraged wife to keep blinds open during the day, and to have him up out of bed interacting with reinforcement of location, situation, month/year. Suspect hospital acquired delirium as pt was in ICU several days.  Good progress expected.  Pt currently with functional limitations due to the deficits listed below (see PT Problem List). Pt will benefit from acute skilled PT to increase their independence and safety with mobility to allow discharge.           If plan is discharge home, recommend the following: A little help with walking and/or transfers;A little help with bathing/dressing/bathroom;Assistance with cooking/housework;Help with stairs or ramp for entrance;Direct supervision/assist for medications management;Direct supervision/assist for financial management   Can travel by  private vehicle        Equipment Recommendations Rolling walker (2 wheels)  Recommendations for Other Services       Functional Status Assessment Patient has had a recent decline in their functional status and demonstrates the ability to make significant improvements in function in a reasonable and predictable amount of time.     Precautions / Restrictions Precautions Precautions: Fall Restrictions Weight Bearing Restrictions: No      Mobility  Bed Mobility Overal bed mobility: Needs Assistance Bed Mobility: Supine to Sit     Supine to sit: Mod assist     General bed mobility comments: assist to raise trunk    Transfers Overall transfer level: Needs assistance Equipment used: Rolling walker (2 wheels) Transfers: Sit to/from Stand, Bed to chair/wheelchair/BSC Sit to Stand: Min assist, From elevated surface           General transfer comment: STS x 2 with verbal cues for hand placement. Pt stood and performed L/R weight shifting for ~75 seconds, tolerance limited by lightheadedness. Then second attempt took several pivotal steps to recliner.    Ambulation/Gait                  Stairs            Wheelchair Mobility     Tilt Bed    Modified Rankin (Stroke Patients Only)       Balance Overall balance assessment: Needs assistance Sitting-balance support: Feet supported, No upper extremity supported Sitting balance-Leahy Scale: Good     Standing balance support: Bilateral upper extremity supported, During functional activity, Reliant on assistive device for balance Standing balance-Leahy Scale: Fair  Pertinent Vitals/Pain Pain Assessment Pain Assessment: No/denies pain    Home Living Family/patient expects to be discharged to:: Private residence Living Arrangements: Spouse/significant other;Children Available Help at Discharge: Family;Available 24 hours/day Type of Home: House Home Access:  Stairs to enter Entrance Stairs-Rails: Can reach both;Left;Right Entrance Stairs-Number of Steps: 5 Alternate Level Stairs-Number of Steps: 5 Home Layout: Two level Home Equipment: None Additional Comments: lives with wife who works 3-11, daughter is home during that time    Prior Function Prior Level of Function : Independent/Modified Independent;Driving             Mobility Comments: walks without AD, ADLs Comments: independent     Extremity/Trunk Assessment   Upper Extremity Assessment Upper Extremity Assessment: Overall WFL for tasks assessed    Lower Extremity Assessment Lower Extremity Assessment: Overall WFL for tasks assessed    Cervical / Trunk Assessment Cervical / Trunk Assessment: Normal  Communication   Communication Communication: Difficulty following commands/understanding Following commands: Follows one step commands consistently  Cognition Arousal: Alert Behavior During Therapy: WFL for tasks assessed/performed Overall Cognitive Status: Impaired/Different from baseline Area of Impairment: Orientation, Memory                 Orientation Level: Place, Time, Situation             General Comments: not able to state current month nor year, not able to state current location; able to follow commands well. Very pleasant. After being told the current month, then asked several minutes later what the month is, he was unable to recall it. Wife states this is NOT baseline. Suspect confusion is due hospital aquired delirium, pt was in ICU for several days.        General Comments      Exercises     Assessment/Plan    PT Assessment Patient needs continued PT services  PT Problem List Decreased activity tolerance;Decreased mobility;Decreased cognition       PT Treatment Interventions Functional mobility training;Therapeutic activities;Gait training;Therapeutic exercise;Balance training    PT Goals (Current goals can be found in the Care Plan  section)  Acute Rehab PT Goals Patient Stated Goal: likes to watch tv, hang out with 7 grandkids PT Goal Formulation: With patient/family Time For Goal Achievement: 10/02/23 Potential to Achieve Goals: Good    Frequency Min 1X/week     Co-evaluation               AM-PAC PT "6 Clicks" Mobility  Outcome Measure Help needed turning from your back to your side while in a flat bed without using bedrails?: A Little Help needed moving from lying on your back to sitting on the side of a flat bed without using bedrails?: A Little Help needed moving to and from a bed to a chair (including a wheelchair)?: A Little Help needed standing up from a chair using your arms (e.g., wheelchair or bedside chair)?: A Little Help needed to walk in hospital room?: A Lot Help needed climbing 3-5 steps with a railing? : A Lot 6 Click Score: 16    End of Session Equipment Utilized During Treatment: Gait belt Activity Tolerance: Patient tolerated treatment well Patient left: with call bell/phone within reach;in chair;with family/visitor present Nurse Communication: Mobility status PT Visit Diagnosis: Difficulty in walking, not elsewhere classified (R26.2)    Time: 4098-1191 PT Time Calculation (min) (ACUTE ONLY): 32 min   Charges:   PT Evaluation $PT Eval Moderate Complexity: 1 Mod PT Treatments $Gait Training: 8-22 mins PT  General Charges $$ ACUTE PT VISIT: 1 Visit        Tamala Ser PT 09/18/2023  Acute Rehabilitation Services  Office [phone]

## 2023-09-18 NOTE — Progress Notes (Signed)
Initial Nutrition Assessment  INTERVENTION:   -Ensure Plus High Protein po BID, each supplement provides 350 kcal and 20 grams of protein.   To aid in wound healing: -Multivitamin with minerals daily -500 mg Vitamin C BID -220 mg Zinc sulfate daily x 14 days  NUTRITION DIAGNOSIS:   Increased nutrient needs related to  (Fournier's gangrene) as evidenced by estimated needs.  GOAL:   Patient will meet greater than or equal to 90% of their needs  MONITOR:   PO intake, Supplement acceptance, Labs, Weight trends, I & O's, Skin  REASON FOR ASSESSMENT:   Consult Enteral/tube feeding initiation and management  ASSESSMENT:   72 yr old male with pmh of HTN, CAD (on Brilinta), Type 2 DM (Metformin), COPD, bronchiectasis, high grade prostate cancer with recent Gold seed implant and SpaceOAR gel placement for radiation treatment on 08/14/23, asbestos exposure/chemical exposure by working in [employer] for a total of 57yrs, former smoker tobacco for a total of 8 yrs (half a pack a day), and recently quit smoking marijuana over 2 years ago. Patient originally presented to [facility] ED on 11/16 for testicular/perianal pain.  Workup revealed possibility of Fournier's gangrene.  11/21: admitted, s/p I&D 11/22: s/p washout, debridement, intubated 11/24: extubated, full liquid diet  Patient working with therapy at time of visit. Seems pt is disoriented today. Will attempt to speak with pt at later time. Pt continues to be on full liquid diet. Ensure supplements have been ordered and accepted. Likely needs vitamin supplementation to aid in healing of Fournier's gangrene.   Admission weight: 207 lbs Current weight:  203 lbs  Medications: Colace, Zofran  Labs reviewed: CBGs: 84-169  NUTRITION - FOCUSED PHYSICAL EXAM:  Not able to complete at this time  Diet Order:   Diet Order             Diet full liquid Room service appropriate? Yes; Fluid consistency: Thin  Diet effective now                    EDUCATION NEEDS:   Not appropriate for education at this time  Skin:  Skin Assessment: Skin Integrity Issues: Skin Integrity Issues:: Incisions Incisions: 11/21 perineum, 11/22 scrotum  Last BM:  11/26 -type 6  Height:   Ht Readings from Last 1 Encounters:  09/14/23 5\' 9"  (1.753 m)    Weight:   Wt Readings from Last 1 Encounters:  09/18/23 92.4 kg    BMI:  Body mass index is 30.08 kg/m.  Estimated Nutritional Needs:   Kcal:  2200-2400  Protein:  110-120g  Fluid:  2.2L/day  Tilda Franco, MS, RD, LDN Inpatient Clinical Dietitian Contact information available via Amion

## 2023-09-18 NOTE — Plan of Care (Signed)
  Problem: Metabolic: Goal: Ability to maintain appropriate glucose levels will improve Outcome: Progressing   Problem: Coping: Goal: Level of anxiety will decrease Outcome: Progressing   Problem: Coping: Goal: Ability to adjust to condition or change in health will improve Outcome: Not Progressing   Problem: Fluid Volume: Goal: Ability to maintain a balanced intake and output will improve Outcome: Not Progressing   Problem: Nutritional: Goal: Progress toward achieving an optimal weight will improve Outcome: Not Progressing   Problem: Tissue Perfusion: Goal: Adequacy of tissue perfusion will improve Outcome: Not Progressing   Problem: Health Behavior/Discharge Planning: Goal: Ability to manage health-related needs will improve Outcome: Not Progressing   Problem: Activity: Goal: Risk for activity intolerance will decrease Outcome: Not Progressing   Problem: Pain Management: Goal: General experience of comfort will improve Outcome: Not Progressing

## 2023-09-18 NOTE — Progress Notes (Signed)
   4 Days Post-Op Subjective: NAEON. Somnolent, likely 2/2 higher narcotic requirements. Answering questions appropriately. 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 on 09/13/2023  Second look with [name].  Minimal debridement, washout, Gen surgery exam, and dressing change on 09/14/2023  POD #5/4.  1 mg of Dilaudid was given before beginning wound care and patient struggled significantly through physical exam and packing again. I was assisted by his nursing student  The wound was approximately 5 cm deep with no tracking noted.  This was repacked with iodoform gauze and covered with damp Kerlix. He is really struggling to tolerate wound care, though I think his is close to the maximum safe amount of narcotic he can be provided.  Urology will assist with these as available.  Intake/Output from previous day: 11/25 0701 - 11/26 0700 In: 1937.6 [P.O.:1200; I.V.:20; IV Piggyback:717.6] Out: 1651 [Urine:1650; Stool:1]  Intake/Output this shift: No intake/output data recorded.  Physical Exam:  General: Alert and oriented CV: No cyanosis Lungs: equal chest rise Abdomen: Soft, NTND, no rebound or guarding Gu: S/p surgical debridement of Fournier's gangrene of posterior scrotum and perineum. 5cm deep central perineal wound packed with iodoform gauze. Pink healthy wound bed without purulent drainage or signs of necrosis.   Lab Results: Recent Labs    09/16/23 0659 09/17/23 0329 09/18/23 0508  HGB 9.7* 9.6* 9.9*  HCT 30.7* 31.1* 31.0*   BMET Recent Labs    09/17/23 0329  09/18/23 0508  NA 140 143  K 3.3* 3.6  CL 112* 112*  CO2 22 23  GLUCOSE 116* 96  BUN 27* 22  CREATININE 1.16 1.05  CALCIUM 8.6* 9.2     Studies/Results: No results found.    LOS: 5 days   Elmon Kirschner, NP [facility] Pager: [phone]  09/18/2023, 10:00 AM

## 2023-09-18 NOTE — Radiation Completion Notes (Signed)
Patient Name: Evan Brock, Evan Brock MRN: [id number] Date of Birth: 1951-04-07 Referring Physician: Kasandra Knudsen, M.D. Date of Service: 2023-09-18 Radiation Oncologist: Margaretmary Bayley, M.D. [facility]                             RADIATION ONCOLOGY END OF TREATMENT NOTE     Diagnosis: C61 Malignant neoplasm of prostate Staging on 2023-01-03: Malignant neoplasm of prostate (HCC) T=cT1c, N=cN0, M=cM0 Intent: Curative     ==========DELIVERED PLANS==========  First Treatment Date: 2023-08-20 - Last Treatment Date: 2023-09-12   Plan Name: Prostate_Pelv Site: Prostate Technique: IMRT Mode: Photon Dose Per Fraction: 1.8 Gy Prescribed Dose (Delivered / Prescribed): 19.8 Gy / 45 Gy Prescribed Fxs (Delivered / Prescribed): 11 / 25     ==========ON TREATMENT VISIT DATES========== 2023-09-03, 2023-09-06     ==========UPCOMING VISITS==========       ==========APPENDIX - ON TREATMENT VISIT NOTES==========   See weekly On Treatment Notes in Epic for details.

## 2023-09-18 NOTE — Anesthesia Postprocedure Evaluation (Signed)
Anesthesia Post Note  Patient: Evan Brock  Procedure(s) Performed: INCISION AND DRAINAGE ABSCESS     Patient location during evaluation: PACU Anesthesia Type: General Level of consciousness: awake and alert Pain management: pain level controlled Vital Signs Assessment: post-procedure vital signs reviewed and stable Respiratory status: spontaneous breathing, nonlabored ventilation, respiratory function stable and patient connected to nasal cannula oxygen Cardiovascular status: blood pressure returned to baseline and stable Postop Assessment: no apparent nausea or vomiting Anesthetic complications: no   No notable events documented.      Shelton Silvas

## 2023-09-18 NOTE — Consult Note (Signed)
[facility]  Total days of antibiotics 6       Reason for Consult: perineal abscess    Referring Physician: Rito Ehrlich  Principal Problem:   Septic shock (HCC) Active Problems:   AKI (acute kidney injury) (HCC)   Fournier's gangrene    HPI: Evan Brock is a 72 y.o. male  with a history of grade 5 prostate cancer, status post gold fiducial marker and SpaceOAR placement on 08/14/2023 followed by IMRT treatments to the prostate.  The patient was initially seen in the emergency department on 09/08/2023 due to worsening perineal pain and general malaise.  The perineal abscess appeared to be present at that time, but he had no intervention. He returned to ED on 09/13/23 and found have large perineal abscess and sepsis. He was started on broad spectrum abtx  where he had initial I xD on 11/21 and repeat on 11/22. OR cultures are polymicrobial with e.faecalis, c.acnes, and bacteroides. Dr Arita Miss planning to take back to the OR in 6-7 days for closure and re-evaluation.   Past Medical History:  Diagnosis Date   Anticoagulant long-term use    brilinta/ asa--- managed by cardiology   Benign localized prostatic hyperplasia with lower urinary tract symptoms (LUTS)    Chronic constipation    OTC laxative prn   Chronic pain syndrome    pain management--- dr foster [facility])   neck and back   COPD (chronic obstructive pulmonary disease) (HCC)    followed by pcp ---  not on oxygen (previous seen by pulmonology-- dr byrum--- lov in epic 07-12-2020);   last exacerbation [facility]-23-2023 [facility]  pneumonia/ hypoxia w/ O2 82% refused ED,  then ED visit 05-17-2023 hypoxia / treated w/ nebs, antibiotics;  back at urgent care 06-06-2023  exacerbation / pneumonia/ +COVID,  O2 90% treated w/ nebs/ prednisone/ antibiotics   Coronary artery disease 01/2022   cardiologist--- dr Valentino Saxon;  NSTEMI  s/p cath 02-16-2022  thrombectomy / PTCA w/ DES to LCX;   DDD (degenerative disc disease),  cervical    DDD (degenerative disc disease), lumbosacral    ED (erectile dysfunction)    Exertional shortness of breath    08-08-2023   unable to do stairs or yard word, but can do household chores ok   History of acute respiratory failure    several times w/ hypoxia   History of asbestos exposure    History of non-ST elevation myocardial infarction (NSTEMI) 02/16/2022   Hyperlipidemia, mixed    Hypertension    Malignant neoplasm prostate (HCC) 12/2022   urologist-- dr pace/  radiation oncologist--- dr Kathrynn Running;   dx 03/ 2024, gleason 4+5   Moderate persistent asthma    OA (osteoarthritis)    S/P drug eluting coronary stent placement 02/17/2023   x1 to LCx   Type 2 diabetes mellitus (HCC)    followed by pcp   (08-08-2023  per pt checks blood sugar one a month)   Wears partial dentures    upper only    Allergies: No Known Allergies  MEDICATIONS:  ascorbic acid  500 mg Oral BID   Chlorhexidine Gluconate Cloth  6 each Topical Daily   docusate sodium  100 mg Oral BID   feeding supplement  237 mL Oral BID BM   fluticasone furoate-vilanterol  1 puff Inhalation Daily   heparin  5,000 Units Subcutaneous Q8H   insulin aspart  0-15 Units Subcutaneous Q4H   linezolid  600 mg Oral Q12H   multivitamin  with minerals  1 tablet Oral Daily   pantoprazole  40 mg Oral BID AC   sodium chloride flush  10-40 mL Intracatheter Q12H   umeclidinium bromide  1 puff Inhalation Daily   zinc sulfate (50mg  elemental zinc)  220 mg Oral Daily    Social History   Tobacco Use   Smoking status: Former    Types: Cigarettes    Start date: 1992    Quit date: 1972    Years since quitting: 52.9   Smokeless tobacco: Never   Tobacco comments:    08-08-2023  per pt quit smoking 1992,  started age 41,   Vaping Use   Vaping status: Never Used  Substance Use Topics   Alcohol use: Yes    Alcohol/week: 7.0 standard drinks of alcohol    Types: 7 Cans of beer per week    Comment: 08-08-2023  one beer per night  ,  16 oz   Drug use: Not Currently    Types: Marijuana    Comment: 10/01/2013 "last drug use was 10 yr ago"    Family History  Problem Relation Age of Onset   Diabetes Mother    Hypertension Father    Lupus Sister     Review of Systems -  12 point ros is negative except groin pain.  OBJECTIVE: Temp:  [98.4 F (36.9 C)-99.2 F (37.3 C)] 98.7 F (37.1 C) (11/26 1314) Pulse Rate:  [68-99] 99 (11/26 1314) Resp:  [17-19] 17 (11/26 0409) BP: (142-162)/(72-84) 142/72 (11/26 1314) SpO2:  [92 %-100 %] 98 % (11/26 1314) Weight:  [92.4 kg] 92.4 kg (11/26 0700) Physical Exam  Constitutional: He is oriented to person, place, and time. He appears well-developed and well-nourished. No distress.  HENT:  Mouth/Throat: Oropharynx is clear and moist. No oropharyngeal exudate.  Cardiovascular: Normal rate, regular rhythm and normal heart sounds. Exam reveals no gallop and no friction rub.  No murmur heard.  Pulmonary/Chest: Effort normal and breath sounds normal. No respiratory distress. He has no wheezes.  Abdominal: Soft. Bowel sounds are normal. He exhibits no distension. There is no tenderness.  Lymphadenopathy:  He has no cervical adenopathy.  Neurological: He is alert and oriented to person, place, and time.  Skin: Skin is warm and dry. No rash noted. No erythema.  Psychiatric: He has a normal mood and affect. His behavior is normal.    LABS: Results for orders placed or performed during the hospital encounter of 09/13/23 (from the past 48 hour(s))  Phosphorus     Status: Abnormal   Collection Time: 09/16/23  4:43 PM  Result Value Ref Range   Phosphorus 2.0 (L) 2.5 - 4.6 mg/dL    Comment: Performed at [facility], [address] [place], [place] [address]  Glucose, capillary     Status: Abnormal   Collection Time: 09/16/23  7:43 PM  Result Value Ref Range   Glucose-Capillary 114 (H) 70 - 99 mg/dL    Comment: Glucose reference range applies only to samples  taken after fasting for at least 8 hours.  Glucose, capillary     Status: Abnormal   Collection Time: 09/16/23 11:14 PM  Result Value Ref Range   Glucose-Capillary 119 (H) 70 - 99 mg/dL    Comment: Glucose reference range applies only to samples taken after fasting for at least 8 hours.  CBC     Status: Abnormal   Collection Time: 09/17/23  3:29 AM  Result Value Ref Range   WBC 6.9 4.0 -  10.5 K/uL   RBC 3.72 (L) 4.22 - 5.81 MIL/uL   Hemoglobin 9.6 (L) 13.0 - 17.0 g/dL   HCT 08.6 (L) 57.8 - 46.9 %   MCV 83.6 80.0 - 100.0 fL   MCH 25.8 (L) 26.0 - 34.0 pg   MCHC 30.9 30.0 - 36.0 g/dL   RDW 62.9 (H) 52.8 - 41.3 %   Platelets 151 150 - 400 K/uL   nRBC 0.0 0.0 - 0.2 %    Comment: Performed at [facility], [address] [place], [place] [address]  Basic metabolic panel     Status: Abnormal   Collection Time: 09/17/23  3:29 AM  Result Value Ref Range   Sodium 140 135 - 145 mmol/L    Comment: DELTA CHECK NOTED   Potassium 3.3 (L) 3.5 - 5.1 mmol/L   Chloride 112 (H) 98 - 111 mmol/L   CO2 22 22 - 32 mmol/L   Glucose, Bld 116 (H) 70 - 99 mg/dL    Comment: Glucose reference range applies only to samples taken after fasting for at least 8 hours.   BUN 27 (H) 8 - 23 mg/dL   Creatinine, Ser 0.27 0.61 - 1.24 mg/dL   Calcium 8.6 (L) 8.9 - 10.3 mg/dL   GFR, Estimated >25 >36 mL/min    Comment: (NOTE) Calculated using the CKD-EPI Creatinine Equation (2021)    Anion gap 6 5 - 15    Comment: Performed at [facility], [address], [place], [place] [address]  Magnesium     Status: None   Collection Time: 09/17/23  3:29 AM  Result Value Ref Range   Magnesium 1.7 1.7 - 2.4 mg/dL    Comment: Performed at [facility], [address], [place], [place] [address]  Glucose, capillary     Status: Abnormal   Collection Time: 09/17/23  3:41 AM  Result Value Ref Range   Glucose-Capillary 108 (H) 70 - 99 mg/dL    Comment: Glucose reference  range applies only to samples taken after fasting for at least 8 hours.  Glucose, capillary     Status: None   Collection Time: 09/17/23  8:01 AM  Result Value Ref Range   Glucose-Capillary 94 70 - 99 mg/dL    Comment: Glucose reference range applies only to samples taken after fasting for at least 8 hours.   Comment 1 Notify RN    Comment 2 Document in Chart   Glucose, capillary     Status: Abnormal   Collection Time: 09/17/23 11:35 AM  Result Value Ref Range   Glucose-Capillary 112 (H) 70 - 99 mg/dL    Comment: Glucose reference range applies only to samples taken after fasting for at least 8 hours.  Glucose, capillary     Status: Abnormal   Collection Time: 09/17/23  4:57 PM  Result Value Ref Range   Glucose-Capillary 114 (H) 70 - 99 mg/dL    Comment: Glucose reference range applies only to samples taken after fasting for at least 8 hours.  Glucose, capillary     Status: Abnormal   Collection Time: 09/17/23  8:22 PM  Result Value Ref Range   Glucose-Capillary 111 (H) 70 - 99 mg/dL    Comment: Glucose reference range applies only to samples taken after fasting for at least 8 hours.  Glucose, capillary     Status: Abnormal   Collection Time: 09/17/23 11:18 PM  Result Value Ref Range   Glucose-Capillary 122 (H) 70 - 99  mg/dL    Comment: Glucose reference range applies only to samples taken after fasting for at least 8 hours.  Glucose, capillary     Status: None   Collection Time: 09/18/23  4:09 AM  Result Value Ref Range   Glucose-Capillary 84 70 - 99 mg/dL    Comment: Glucose reference range applies only to samples taken after fasting for at least 8 hours.  CBC     Status: Abnormal   Collection Time: 09/18/23  5:08 AM  Result Value Ref Range   WBC 6.3 4.0 - 10.5 K/uL   RBC 3.77 (L) 4.22 - 5.81 MIL/uL   Hemoglobin 9.9 (L) 13.0 - 17.0 g/dL   HCT 16.1 (L) 09.6 - 04.5 %   MCV 82.2 80.0 - 100.0 fL   MCH 26.3 26.0 - 34.0 pg   MCHC 31.9 30.0 - 36.0 g/dL   RDW 40.9 (H) 81.1 - 91.4  %   Platelets 166 150 - 400 K/uL   nRBC 0.0 0.0 - 0.2 %    Comment: Performed at [facility], [address], [place], [place] [address]  Basic metabolic panel     Status: Abnormal   Collection Time: 09/18/23  5:08 AM  Result Value Ref Range   Sodium 143 135 - 145 mmol/L   Potassium 3.6 3.5 - 5.1 mmol/L   Chloride 112 (H) 98 - 111 mmol/L   CO2 23 22 - 32 mmol/L   Glucose, Bld 96 70 - 99 mg/dL    Comment: Glucose reference range applies only to samples taken after fasting for at least 8 hours.   BUN 22 8 - 23 mg/dL   Creatinine, Ser 6.21 0.61 - 1.24 mg/dL   Calcium 9.2 8.9 - 30.8 mg/dL   GFR, Estimated >65 >78 mL/min    Comment: (NOTE) Calculated using the CKD-EPI Creatinine Equation (2021)    Anion gap 8 5 - 15    Comment: Performed at [facility], [address] [place], [place] [address]  Glucose, capillary     Status: None   Collection Time: 09/18/23  7:37 AM  Result Value Ref Range   Glucose-Capillary 88 70 - 99 mg/dL    Comment: Glucose reference range applies only to samples taken after fasting for at least 8 hours.  Glucose, capillary     Status: Abnormal   Collection Time: 09/18/23 11:52 AM  Result Value Ref Range   Glucose-Capillary 169 (H) 70 - 99 mg/dL    Comment: Glucose reference range applies only to samples taken after fasting for at least 8 hours.    MICRO: Culture RARE ENTEROCOCCUS FAECALIS RARE CUTIBACTERIUM ACNES Standardized susceptibility testing for this organism is not available. MODERATE BACTEROIDES THETAIOTAOMICRON BETA LACTAMASE POSITIVE Performed at [facility], [address] [place], [place] [address]  Report Status 09/18/2023 FINAL  Organism ID, Bacteria ENTEROCOCCUS FAECALIS  Resulting Agency [facility]     Susceptibility    Enterococcus faecalis    MIC    AMPICILLIN <=2 SENSITIVE Sensitive    GENTAMICIN SYNERGY RESISTANT Resistant    VANCOMYCIN 1 SENSITIVE Sensitive         IMAGING: DG Abd Portable 1V  Result Date: 09/18/2023 CLINICAL DATA:  Nausea and vomiting EXAM: PORTABLE ABDOMEN - 1 VIEW COMPARISON:  Four days ago FINDINGS: Diffuse bowel gas without obstructive pattern or over distension. No abnormal stool retention. No concerning mass effect or calcification. Fiducial markers overlap the prostate. Bilateral hip replacement. IMPRESSION: Normal bowel  gas pattern. Electronically Signed   By: Tiburcio Pea M.D.   On: 09/18/2023 10:21    Assessment/Plan:  72yo M with perineal abscess in patient receing treatment for prostate cancer with  spaceoar placement  - continue with amp/sub monotherapy which would cover all the pathogens found - will continue on iv abtx through his next surgery in 6-7 days then based on those findings, can decide to transition to orals.  Initial leukocytosis = improved since repeated Ix D for source control  GU wound = defer to urology for management of wound til next surgery.  Duke Salvia Drue Second MD MPH [facility] [phone]

## 2023-09-18 NOTE — Plan of Care (Signed)
  Problem: Clinical Measurements: Goal: Diagnostic test results will improve Outcome: Progressing Goal: Respiratory complications will improve Outcome: Progressing Goal: Cardiovascular complication will be avoided Outcome: Progressing   Problem: Elimination: Goal: Will not experience complications related to urinary retention Outcome: Progressing   Problem: Pain Management: Goal: General experience of comfort will improve Outcome: Progressing   Problem: Safety: Goal: Ability to remain free from injury will improve Outcome: Progressing

## 2023-09-19 ENCOUNTER — Ambulatory Visit: Payer: Medicare HMO

## 2023-09-19 DIAGNOSIS — R6521 Severe sepsis with septic shock: Secondary | ICD-10-CM | POA: Diagnosis not present

## 2023-09-19 DIAGNOSIS — A419 Sepsis, unspecified organism: Secondary | ICD-10-CM | POA: Diagnosis not present

## 2023-09-19 LAB — BASIC METABOLIC PANEL
Anion gap: 6 (ref 5–15)
BUN: 21 mg/dL (ref 8–23)
CO2: 29 mmol/L (ref 22–32)
Calcium: 8.9 mg/dL (ref 8.9–10.3)
Chloride: 110 mmol/L (ref 98–111)
Creatinine, Ser: 0.98 mg/dL (ref 0.61–1.24)
GFR, Estimated: >60 mL/min (ref >60)
Glucose, Bld: 92 mg/dL (ref 70–99)
Potassium: 3.5 mmol/L (ref 3.5–5.1)
Sodium: 145 mmol/L (ref 135–145)

## 2023-09-19 LAB — CBC
HCT: 31.2 % — ABNORMAL LOW (ref 39.0–52.0)
Hemoglobin: 9.4 g/dL — ABNORMAL LOW (ref 13.0–17.0)
MCH: 25.9 pg — ABNORMAL LOW (ref 26.0–34.0)
MCHC: 30.1 g/dL (ref 30.0–36.0)
MCV: 86 fL (ref 80.0–100.0)
Platelets: 168 10*3/uL (ref 150–400)
RBC: 3.63 MIL/uL — ABNORMAL LOW (ref 4.22–5.81)
RDW: 16 % — ABNORMAL HIGH (ref 11.5–15.5)
WBC: 6.8 10*3/uL (ref 4.0–10.5)
nRBC: 0 % (ref 0.0–0.2)

## 2023-09-19 LAB — GLUCOSE, CAPILLARY
Glucose-Capillary: 106 mg/dL — ABNORMAL HIGH (ref 70–99)
Glucose-Capillary: 122 mg/dL — ABNORMAL HIGH (ref 70–99)
Glucose-Capillary: 80 mg/dL (ref 70–99)
Glucose-Capillary: 81 mg/dL (ref 70–99)
Glucose-Capillary: 90 mg/dL (ref 70–99)
Glucose-Capillary: 99 mg/dL (ref 70–99)

## 2023-09-19 MED ORDER — SIMETHICONE 80 MG PO CHEW
80.0000 mg | CHEWABLE_TABLET | Freq: Four times a day (QID) | ORAL | Status: DC | PRN
  Administered 2023-09-19 – 2023-09-22 (×2): 80 mg via ORAL
  Filled 2023-09-19 (×3): qty 1

## 2023-09-19 NOTE — Hospital Course (Addendum)
72 yr old male with pmh of HTN, CAD (on Brilinta), Type 2 DM (Metformin), COPD, bronchiectasis, high grade prostate cancer with recent Gold seed implant and SpaceOAR gel placement for radiation treatment on 08/14/23, asbestos exposure/chemical exposure by working in [employer] for a total of 2yrs, former smoker tobacco for a total of 8 yrs (half a pack a day), and recently quit smoking marijuana over 2 years ago. Patient originally presented to [facility] ED on 11/16 for testicular/perianal pain.  Workup revealed possibility of Fournier's gangrene.  Urology was consulted.  Patient was admitted to the ICU for septic shock.  Stabilized.  Underwent surgery.  Subsequently transferred to the floor  and managing with regular dressing changes, IV antibiotics per urology and ID.  Overall patient is clinically improved, patient went for scrotal washout perineal washout and partial closure of scrotum on 12/3 and repacking of perineal abscess cavity.  Urology advised to do voiding trial and transition to oral antibiotics on 09/26/23.Marland KitchenPatient will continue Augmentin x 3 wk EOT 10/15/23. Dressing instruction has been provided and educated to patient's wife and home health care is arranged.  At this time patient feels comfortable going home with current plan of care

## 2023-09-19 NOTE — Plan of Care (Signed)
  Problem: Education: Goal: Ability to describe self-care measures that may prevent or decrease complications (Diabetes Survival Skills Education) will improve Outcome: Progressing Goal: Individualized Educational Video(s) Outcome: Progressing   Problem: Coping: Goal: Ability to adjust to condition or change in health will improve Outcome: Progressing   Problem: Fluid Volume: Goal: Ability to maintain a balanced intake and output will improve Outcome: Progressing   Problem: Health Behavior/Discharge Planning: Goal: Ability to identify and utilize available resources and services will improve Outcome: Progressing Goal: Ability to manage health-related needs will improve Outcome: Progressing   Problem: Metabolic: Goal: Ability to maintain appropriate glucose levels will improve Outcome: Progressing   Problem: Nutritional: Goal: Maintenance of adequate nutrition will improve Outcome: Progressing Goal: Progress toward achieving an optimal weight will improve Outcome: Progressing   Problem: Skin Integrity: Goal: Risk for impaired skin integrity will decrease Outcome: Progressing   Problem: Tissue Perfusion: Goal: Adequacy of tissue perfusion will improve Outcome: Progressing   Problem: Education: Goal: Knowledge of General Education information will improve Description: Including pain rating scale, medication(s)/side effects and non-pharmacologic comfort measures Outcome: Progressing   Problem: Health Behavior/Discharge Planning: Goal: Ability to manage health-related needs will improve Outcome: Progressing   Problem: Clinical Measurements: Goal: Ability to maintain clinical measurements within normal limits will improve Outcome: Progressing Goal: Will remain free from infection Outcome: Progressing Goal: Diagnostic test results will improve Outcome: Progressing Goal: Respiratory complications will improve Outcome: Progressing Goal: Cardiovascular complication will  be avoided Outcome: Progressing   Problem: Activity: Goal: Risk for activity intolerance will decrease Outcome: Progressing   Problem: Nutrition: Goal: Adequate nutrition will be maintained Outcome: Progressing   Problem: Coping: Goal: Level of anxiety will decrease Outcome: Progressing   Problem: Elimination: Goal: Will not experience complications related to bowel motility Outcome: Progressing Goal: Will not experience complications related to urinary retention Outcome: Progressing   Problem: Pain Management: Goal: General experience of comfort will improve Outcome: Progressing   Problem: Safety: Goal: Ability to remain free from injury will improve Outcome: Progressing   Problem: Skin Integrity: Goal: Risk for impaired skin integrity will decrease Outcome: Progressing   Problem: Activity: Goal: Ability to tolerate increased activity will improve Outcome: Progressing   Problem: Respiratory: Goal: Ability to maintain a clear airway and adequate ventilation will improve Outcome: Progressing   Problem: Role Relationship: Goal: Method of communication will improve Outcome: Progressing

## 2023-09-19 NOTE — Evaluation (Signed)
Occupational Therapy Evaluation Patient Details Name: Evan Brock MRN: [id number] DOB: 1951/05/08 Today's Date: 09/19/2023   History of Present Illness 72 yr old malepresented to [facility] ED on 11/16 for testicular/perianal pain. Patient was admitted to the ICU for septic shock. Workup revealed possibility of Fournier's gangrene.  s/p Exploration, debridement, and packing  on 09/13/2023.  Pt with pmh of HTN, CAD (on Brilinta), Type 2 DM (Metformin), COPD, bronchiectasis, high grade prostate cancer with recent Gold seed implant and SpaceOAR gel placement for radiation treatment on 08/14/23, asbestos exposure/chemical exposure by working in [employer] for a total of 14yrs, former smoker tobacco for a total of 8 yrs (half a pack a day), and recently quit smoking marijuana over 2 years ago.   Clinical Impression   Pt was independent prior to admission. Lives with his family who work opposite shifts. Pt presents with impaired cognition, generalized weakness, impaired balance and groin pain. Mod assist needed for supine to sit, CGA with increased time to stand and min assist to transfer with RW to chair. Pt requires set up to total assist for ADLs. Recommending HHOT upon discharge.       If plan is discharge home, recommend the following: A little help with walking and/or transfers;A lot of help with bathing/dressing/bathroom;Assistance with cooking/housework;Direct supervision/assist for medications management;Direct supervision/assist for financial management;Assist for transportation;Help with stairs or ramp for entrance    Functional Status Assessment  Patient has had a recent decline in their functional status and demonstrates the ability to make significant improvements in function in a reasonable and predictable amount of time.  Equipment Recommendations  BSC/3in1    Recommendations for Other Services       Precautions / Restrictions Precautions Precautions: Fall Restrictions Weight  Bearing Restrictions: No      Mobility Bed Mobility Overal bed mobility: Needs Assistance Bed Mobility: Supine to Sit     Supine to sit: Mod assist     General bed mobility comments: assist to raise trunk and for hips to EOB with bed pad    Transfers Overall transfer level: Needs assistance Equipment used: Rolling walker (2 wheels) Transfers: Sit to/from Stand Sit to Stand: Contact guard assist, From elevated surface           General transfer comment: increased time from elevated bed and chair, cues for hand placement and to control descent to chair      Balance Overall balance assessment: Needs assistance Sitting-balance support: Feet supported, No upper extremity supported Sitting balance-Leahy Scale: Poor Sitting balance - Comments: relies on B UE support, may be due to pain   Standing balance support: Bilateral upper extremity supported, During functional activity, Reliant on assistive device for balance Standing balance-Leahy Scale: Poor Standing balance comment: unable to release walker in static standing                           ADL either performed or assessed with clinical judgement   ADL Overall ADL's : Needs assistance/impaired Eating/Feeding: Set up;Sitting   Grooming: Wash/dry hands;Sitting;Set up   Upper Body Bathing: Moderate assistance;Sitting   Lower Body Bathing: Total assistance;Sit to/from stand   Upper Body Dressing : Moderate assistance;Sitting   Lower Body Dressing: Total assistance;Sit to/from stand   Toilet Transfer: Minimal assistance;Stand-pivot;Rolling walker (2 wheels) Toilet Transfer Details (indicate cue type and reason): simulated to chair Toileting- Clothing Manipulation and Hygiene: Total assistance;Sit to/from stand  Vision Ability to See in Adequate Light: 0 Adequate Patient Visual Report: No change from baseline       Perception         Praxis         Pertinent Vitals/Pain Pain  Assessment Pain Assessment: Faces Faces Pain Scale: Hurts little more Pain Location: groin Pain Descriptors / Indicators: Discomfort, Grimacing, Guarding Pain Intervention(s): Monitored during session, Repositioned, Other (comment) (pillow in chair)     Extremity/Trunk Assessment Upper Extremity Assessment Upper Extremity Assessment: Overall WFL for tasks assessed   Lower Extremity Assessment Lower Extremity Assessment: Defer to PT evaluation   Cervical / Trunk Assessment Cervical / Trunk Assessment: Normal   Communication     Cognition Arousal: Alert Behavior During Therapy: Flat affect Overall Cognitive Status: Impaired/Different from baseline Area of Impairment: Orientation, Memory, Following commands, Problem solving                 Orientation Level: Place, Time, Situation   Memory: Decreased short-term memory Following Commands: Follows one step commands with increased time     Problem Solving: Slow processing, Decreased initiation, Difficulty sequencing, Requires verbal cues       General Comments       Exercises     Shoulder Instructions      Home Living Family/patient expects to be discharged to:: Private residence Living Arrangements: Spouse/significant other;Children Available Help at Discharge: Family;Available 24 hours/day Type of Home: House Home Access: Stairs to enter [employer] of Steps: 5 Entrance Stairs-Rails: Can reach both;Left;Right Home Layout: Two level Alternate Level Stairs-Number of Steps: 5   Bathroom Shower/Tub: Producer, television/film/video: Standard     Home Equipment: None   Additional Comments: lives with wife who works 3-11, daughter is home during that time      Prior Functioning/Environment Prior Level of Function : Independent/Modified Independent;Driving                        OT Problem List: Impaired balance (sitting and/or standing);Decreased cognition;Decreased safety  awareness;Decreased knowledge of use of DME or AE;Pain      OT Treatment/Interventions: Self-care/ADL training;DME and/or AE instruction;Therapeutic activities;Cognitive remediation/compensation;Balance training;Patient/family education    OT Goals(Current goals can be found in the care plan section) Acute Rehab OT Goals OT Goal Formulation: With patient Time For Goal Achievement: 10/02/23 Potential to Achieve Goals: Good ADL Goals Pt Will Perform Grooming: with supervision;standing Pt Will Perform Upper Body Dressing: with supervision;sitting Pt Will Perform Lower Body Dressing: with mod assist;sit to/from stand Pt Will Transfer to Toilet: with supervision;ambulating;bedside commode Pt Will Perform Toileting - Clothing Manipulation and hygiene: with min assist;sit to/from stand Additional ADL Goal #1: Pt will complete bed mobility with supervision in preparation for ADLs.  OT Frequency: Min 1X/week    Co-evaluation              AM-PAC OT "6 Clicks" Daily Activity     Outcome Measure Help from another person eating meals?: A Little Help from another person taking care of personal grooming?: A Little Help from another person toileting, which includes using toliet, bedpan, or urinal?: Total Help from another person bathing (including washing, rinsing, drying)?: A Lot Help from another person to put on and taking off regular upper body clothing?: A Lot Help from another person to put on and taking off regular lower body clothing?: Total 6 Click Score: 12   End of Session Equipment Utilized During Treatment: Gait belt;Rolling walker (2  wheels) Nurse Communication: Mobility status;Other (comment) (pillow under hips in chair)  Activity Tolerance: Patient tolerated treatment well Patient left: in chair;with call bell/phone within reach;with chair alarm set  OT Visit Diagnosis: Unsteadiness on feet (R26.81);Other abnormalities of gait and mobility (R26.89);Pain;Muscle weakness  (generalized) (M62.81);Other symptoms and signs involving cognitive function                Time: 8295-6213 OT Time Calculation (min): 27 min Charges:  OT General Charges $OT Visit: 1 Visit OT Evaluation $OT Eval Moderate Complexity: 1 Mod OT Treatments $Self Care/Home Management : 8-22 mins  Berna Spare, OTR/L Acute Rehabilitation Services Office: [phone]   Evern Bio 09/19/2023, 10:31 AM

## 2023-09-19 NOTE — Progress Notes (Signed)
PROGRESS NOTE Evan Brock  UUV:[id number] DOB: 09-15-1951 DOA: 09/13/2023 PCP: Bettey Costa, NP  Brief Narrative/Hospital Course: 72 yr old male with pmh of HTN, CAD (on Brilinta), Type 2 DM (Metformin), COPD, bronchiectasis, high grade prostate cancer with recent Gold seed implant and SpaceOAR gel placement for radiation treatment on 08/14/23, asbestos exposure/chemical exposure by working in [employer] for a total of 80yrs, former smoker tobacco for a total of 8 yrs (half a pack a day), and recently quit smoking marijuana over 2 years ago. Patient originally presented to [facility] ED on 11/16 for testicular/perianal pain.  Workup revealed possibility of Fournier's gangrene.  Urology was consulted.  Patient was admitted to the ICU for septic shock.  Stabilized.  Underwent surgery.  Subsequently transferred to the floor      Subjective: Patient seen examined this morning  Resting comfortably in the bedside chair, wife at bedside  overnight afebrile BP stable Labs stable CBC BMP Blood culture no growth so far Remains on Unasyn, seen Still, Colace nutritional supplement vitamin C PPI.   Assessment and Plan: Principal Problem:   Septic shock (HCC) Active Problems:   AKI (acute kidney injury) (HCC)   Fournier's gangrene   Fournier's gangrene Septic shock due to above-now resolved. S/P exploration and debridement and packing with [name] 11/21 Second Look completed with minimal debridement and washout, no infectious symptoms currently WBC remains stable.  Continue pain management, urology following closely-advised to continue pain management and wound care along with IV antibiotics, await and refer to urology for further surgical recommendation. ID is monitoring-with plan to transition to oral antibiotics CLOSE TO DISCAHRGE.  Nausea and vomiting: Doing well, passing gas.  X-ray abdomen 11/26 normal gas pattern cont PPI and antiemetics   History of prostate cancer His radiation treatment is  currently on hold    Bronchiectasis/COPD/suspected OSA: Advised outpatient sleep apnea evaluation, currently on supplemental oxygen, keep on I-S  AKI: Resolved.   Normocytic anemia Hemoglobin remained stable in 9 g once.  Monitor  Recent Labs    09/15/23 0500 09/16/23 0659 09/17/23 0329 09/18/23 0508 09/19/23 0508  HGB 9.1* 9.7* 9.6* 9.9* 9.4*  MCV 83.4 82.7 83.6 82.2 86.0     CAD Essential hypertension: No chest pain.  Blood pressure remained stable.  Holding losartan and home Brilinta due to surgeries. Statin also on hold.  Monitor clinically.     T2DM:  Pta ON metformin at home. Cont SSI for now.  Blood sugar remains well-controlled Recent Labs  Lab 09/13/23 1321 09/13/23 1607 09/18/23 2031 09/19/23 0001 09/19/23 0407 09/19/23 0727 09/19/23 1146  GLUCAP  --    < > 92 122* 80 90 99  HGBA1C 7.0*  --   --   --   --   --   --    < > = values in this interval not displayed.    Obesity:Patient's Body mass index is 30.6 kg/m. : Will benefit with PCP follow-up, weight loss  healthy lifestyle and outpatient sleep evaluation.   DVT prophylaxis: heparin injection 5,000 Units Start: 09/13/23 2200 Code Status:   Code Status: Full Code Family Communication: plan of care discussed with patient/wife at bedside. Patient status is: Inpatient because of fourneir gangrene Level of care: Telemetry   Dispo: The patient is from: home w/ wife            Anticipated disposition: TBD Objective: Vitals last 24 hrs: Vitals:   09/18/23 1314 09/18/23 2032 09/19/23 0409 09/19/23 0712  BP: (!) 142/72 128/73  132/70   Pulse: 99 79 66   Resp:  20 17   Temp: 98.7 F (37.1 C) (!) 97.5 F (36.4 C) 98.6 F (37 C)   TempSrc: Oral Oral Oral   SpO2: 98% 100% 99%   Weight:    94 kg  Height:       Weight change:   Physical Examination: General exam: alert awake, older than stated age HEENT:Oral mucosa moist, Ear/Nose WNL grossly Respiratory system: bilaterally CLEAR BS, no use of  accessory muscle Cardiovascular system: S1 & S2 +, No JVD. Gastrointestinal system: Abdomen soft,NT,ND, BS+ Nervous System:Alert, awake, moving extremities. Extremities: LE edema NEG,distal peripheral pulses palpable.  Skin: No rashes,no icterus. MSK: Normal muscle bulk,tone, power  Medications reviewed:  Scheduled Meds:  ascorbic acid  500 mg Oral BID   Chlorhexidine Gluconate Cloth  6 each Topical Daily   docusate sodium  100 mg Oral BID   feeding supplement  237 mL Oral BID BM   fluticasone furoate-vilanterol  1 puff Inhalation Daily   heparin  5,000 Units Subcutaneous Q8H   insulin aspart  0-15 Units Subcutaneous Q4H   multivitamin with minerals  1 tablet Oral Daily   pantoprazole  40 mg Oral BID AC   sodium chloride flush  10-40 mL Intracatheter Q12H   umeclidinium bromide  1 puff Inhalation Daily   zinc sulfate (50mg  elemental zinc)  220 mg Oral Daily   Continuous Infusions:  ampicillin-sulbactam (UNASYN) IV 3 g (09/19/23 1247)      Diet Order             Diet full liquid Room service appropriate? Yes; Fluid consistency: Thin  Diet effective now                    Nutrition Problem: Increased nutrient needs Etiology:  (Fournier's gangrene) Signs/Symptoms: estimated needs Interventions: Ensure Enlive (each supplement provides 350kcal and 20 grams of protein), MVI   Intake/Output Summary (Last 24 hours) at 09/19/2023 1308 Last data filed at 09/19/2023 1100 Gross per 24 hour  Intake 360 ml  Output 1875 ml  Net -1515 ml   Net IO Since Admission: -523.79 mL [09/19/23 1308]  Wt Readings from Last 3 Encounters:  09/19/23 94 kg  09/08/23 94.8 kg  08/24/23 94.8 kg     Unresulted Labs (From admission, onward)     Start     Ordered   09/14/23 1131  MRSA Next Gen by PCR, Nasal  Once,   R        09/14/23 1130          Data Reviewed: I have personally reviewed following labs and imaging studies CBC: Recent Labs  Lab 09/15/23 0500 09/16/23 0659  09/17/23 0329 09/18/23 0508 09/19/23 0508  WBC 8.1 7.1 6.9 6.3 6.8  HGB 9.1* 9.7* 9.6* 9.9* 9.4*  HCT 29.1* 30.7* 31.1* 31.0* 31.2*  MCV 83.4 82.7 83.6 82.2 86.0  PLT 164 161 151 166 168   Basic Metabolic Panel:  Recent Labs  Lab 09/14/23 0249 09/15/23 0500 09/15/23 1447 09/15/23 1809 09/16/23 0659 09/16/23 1643 09/17/23 0329 09/18/23 0508 09/19/23 0508  NA 135 135  --   --  133*  --  140 143 145  K 4.7 3.9  --   --  3.7  --  3.3* 3.6 3.5  CL 107 108  --   --  107  --  112* 112* 110  CO2 19* 19*  --   --  22  --  22 23 29   GLUCOSE 140* 130*  --   --  159*  --  116* 96 92  BUN 42* 38*  --   --  32*  --  27* 22 21  CREATININE 3.30* 2.39*  --   --  1.38*  --  1.16 1.05 0.98  CALCIUM 7.6* 8.1*  --   --  8.1*  --  8.6* 9.2 8.9  MG 1.8 2.4  --   --  2.0  --  1.7  --   --   PHOS 5.0*  --  2.5 2.3* 2.3* 2.0*  --   --   --    GFR: Estimated Creatinine Clearance: 76 mL/min (by C-G formula based on SCr of 0.98 mg/dL). Liver Function Tests:  Recent Labs  Lab 09/13/23 1040  AST 16  ALT 19  ALKPHOS 54  BILITOT 0.4  PROT 6.2*  ALBUMIN 2.9*   Recent Results (from the past 240 hour(s))  Blood culture (routine x 2)     Status: None   Collection Time: 09/13/23  1:21 PM   Specimen: BLOOD  Result Value Ref Range Status   Specimen Description   Final    BLOOD RIGHT ANTECUBITAL Performed at [facility], [address] [place], [place] [address]    Special Requests   Final    BOTTLES DRAWN AEROBIC AND ANAEROBIC Blood Culture adequate volume Performed at [facility], [address] [place], [place] [address]    Culture   Final    NO GROWTH 5 DAYS Performed at [facility], [address], [place], [place] [address]    Report Status 09/18/2023 FINAL  Final  Blood culture (routine x 2)     Status: None   Collection Time: 09/13/23  1:21 PM   Specimen: BLOOD  Result Value Ref Range Status   Specimen Description   Final    BLOOD  LEFT ANTECUBITAL Performed at [facility], [address] [place], [place] [address]    Special Requests   Final    BOTTLES DRAWN AEROBIC AND ANAEROBIC Blood Culture adequate volume Performed at [facility], [address] [place], [place] [address]    Culture   Final    NO GROWTH 5 DAYS Performed at [facility], [address] [place], [place] [address]    Report Status 09/18/2023 FINAL  Final  Aerobic/Anaerobic Culture w Gram Stain (surgical/deep wound)     Status: None   Collection Time: 09/13/23  6:53 PM   Specimen: Wound; Abscess  Result Value Ref Range Status   Specimen Description   Final    WOUND Performed at [facility], [address], [place], [place] [address]    Special Requests   Final    PERINEAL ABSCESS Performed at [facility], [address], [place], [place] [address]    Gram Stain   Final    FEW WBC PRESENT, PREDOMINANTLY PMN FEW GRAM POSITIVE COCCI IN PAIRS FEW GRAM NEGATIVE RODS    Culture   Final    RARE ENTEROCOCCUS FAECALIS RARE CUTIBACTERIUM ACNES Standardized susceptibility testing for this organism is not available. MODERATE BACTEROIDES THETAIOTAOMICRON BETA LACTAMASE POSITIVE Performed at [facility], [address], [place], [place] [address]    Report Status 09/18/2023 FINAL  Final   Organism ID, Bacteria ENTEROCOCCUS FAECALIS  Final      Susceptibility   Enterococcus faecalis - MIC*  AMPICILLIN <=2 SENSITIVE Sensitive     VANCOMYCIN 1 SENSITIVE Sensitive     GENTAMICIN SYNERGY RESISTANT Resistant     * RARE ENTEROCOCCUS FAECALIS    Antimicrobials: Anti-infectives (From admission, onward)    Start     Dose/Rate Route Frequency Ordered Stop   09/17/23 1600  Ampicillin-Sulbactam (UNASYN) 3 g in sodium chloride 0.9 % 100 mL IVPB        3 g 200 mL/hr over 30 Minutes Intravenous Every 6 hours 09/17/23 1406     09/17/23 0800  ceFEPIme  (MAXIPIME) 2 g in sodium chloride 0.9 % 100 mL IVPB  Status:  Discontinued        2 g 200 mL/hr over 30 Minutes Intravenous Every 8 hours 09/17/23 0717 09/17/23 1406   09/16/23 1530  linezolid (ZYVOX) tablet 600 mg  Status:  Discontinued        600 mg Oral Every 12 hours 09/16/23 1441 09/19/23 0713   09/15/23 1500  vancomycin (VANCOCIN) IVPB 1000 mg/200 mL premix  Status:  Discontinued        1,000 mg 200 mL/hr over 60 Minutes Intravenous Every 48 hours 09/13/23 2020 09/14/23 0931   09/15/23 0130  linezolid (ZYVOX) tablet 600 mg  Status:  Discontinued        600 mg Per Tube Every 12 hours 09/15/23 0116 09/16/23 1441   09/14/23 1400  ceFEPIme (MAXIPIME) 2 g in sodium chloride 0.9 % 100 mL IVPB  Status:  Discontinued        2 g 200 mL/hr over 30 Minutes Intravenous Every 24 hours 09/13/23 2020 09/17/23 0717   09/14/23 1330  linezolid (ZYVOX) tablet 600 mg  Status:  Discontinued        600 mg Oral Every 12 hours 09/14/23 0931 09/15/23 0115   09/13/23 2230  clindamycin (CLEOCIN) IVPB 600 mg  Status:  Discontinued        600 mg 100 mL/hr over 30 Minutes Intravenous Every 8 hours 09/13/23 2138 09/14/23 0931   09/13/23 2200  metroNIDAZOLE (FLAGYL) IVPB 500 mg  Status:  Discontinued        500 mg 100 mL/hr over 60 Minutes Intravenous Every 12 hours 09/13/23 2020 09/17/23 1406   09/13/23 1330  vancomycin (VANCOREADY) IVPB 2000 mg/400 mL        2,000 mg 200 mL/hr over 120 Minutes Intravenous  Once 09/13/23 1324 09/13/23 2125   09/13/23 1330  ceFEPIme (MAXIPIME) 2 g in sodium chloride 0.9 % 100 mL IVPB        2 g 200 mL/hr over 30 Minutes Intravenous  Once 09/13/23 1324 09/13/23 1426   09/13/23 1300  clindamycin (CLEOCIN) IVPB 300 mg        300 mg 100 mL/hr over 30 Minutes Intravenous  Once 09/13/23 1259 09/13/23 1352      Culture/Microbiology    Component Value Date/Time   SDES  09/13/2023 1853    WOUND Performed at [facility], [address], [place], [place]  [address]    SPECREQUEST  09/13/2023 1853    PERINEAL ABSCESS Performed at [facility], [address] [place], [place] [address]    CULT  09/13/2023 1853    RARE ENTEROCOCCUS FAECALIS RARE CUTIBACTERIUM ACNES Standardized susceptibility testing for this organism is not available. MODERATE BACTEROIDES THETAIOTAOMICRON BETA LACTAMASE POSITIVE Performed at [facility], [address], [place], [place] [address]    REPTSTATUS 09/18/2023 FINAL 09/13/2023 1853    Radiology Studies: DG Abd  Portable 1V  Result Date: 09/18/2023 CLINICAL DATA:  Nausea and vomiting EXAM: PORTABLE ABDOMEN - 1 VIEW COMPARISON:  Four days ago FINDINGS: Diffuse bowel gas without obstructive pattern or over distension. No abnormal stool retention. No concerning mass effect or calcification. Fiducial markers overlap the prostate. Bilateral hip replacement. IMPRESSION: Normal bowel gas pattern. Electronically Signed   By: Tiburcio Pea M.D.   On: 09/18/2023 10:21     LOS: 6 days   Total time spent in review of labs and imaging, patient evaluation, formulation of plan, documentation and communication with family: 35 minutes  Lanae Boast, MD [facility]  09/19/2023, 1:08 PM

## 2023-09-19 NOTE — Progress Notes (Signed)
Report received from [facility]. No change in assessment. Will continue plan of care. Kendyll Huettner, Yancey Flemings, RN

## 2023-09-19 NOTE — Progress Notes (Signed)
[facility]    Date of Admission:  09/13/2023   Total days of antibiotics 7/amp/sub   ID: Evan Brock is a 72 y.o. male with prostrate ca admitted for perineal abscess/ fournier'sgangre s/p I x d on 11/21 Principal Problem:   Septic shock (HCC) Active Problems:   AKI (acute kidney injury) (HCC)   Fournier's gangrene    Subjective: Afebrile, still having perirectal pain requiring dilaudid, this morning appears slightly confused when asking him questions  Medications:   ascorbic acid  500 mg Oral BID   Chlorhexidine Gluconate Cloth  6 each Topical Daily   docusate sodium  100 mg Oral BID   feeding supplement  237 mL Oral BID BM   fluticasone furoate-vilanterol  1 puff Inhalation Daily   heparin  5,000 Units Subcutaneous Q8H   insulin aspart  0-15 Units Subcutaneous Q4H   multivitamin with minerals  1 tablet Oral Daily   pantoprazole  40 mg Oral BID AC   sodium chloride flush  10-40 mL Intracatheter Q12H   umeclidinium bromide  1 puff Inhalation Daily   zinc sulfate (50mg  elemental zinc)  220 mg Oral Daily    Objective: Vital signs in last 24 hours: Temp:  [97.5 F (36.4 C)-98.7 F (37.1 C)] 98.6 F (37 C) (11/27 0409) Pulse Rate:  [66-99] 66 (11/27 0409) Resp:  [17-20] 17 (11/27 0409) BP: (128-142)/(70-73) 132/70 (11/27 0409) SpO2:  [98 %-100 %] 99 % (11/27 0409) Weight:  [94 kg] 94 kg (11/27 0272) Physical Exam  Constitutional: He is oriented to person, place, and time. He appears well-developed and well-nourished. No distress.  HENT:  Mouth/Throat: Oropharynx is clear and moist. No oropharyngeal exudate.  Cardiovascular: Normal rate, regular rhythm and normal heart sounds. Exam reveals no gallop and no friction rub.  No murmur heard.  Pulmonary/Chest: Effort normal and breath sounds normal. No respiratory distress. He has no wheezes.  Abdominal: Soft. Bowel sounds are normal. He exhibits no distension. There is no tenderness. Foley in  place Neurological: He is alert and oriented to person, place, and time.  Skin: Skin is warm and dry. No rash noted. No erythema.  Psychiatric: He has a normal mood and affect. His behavior is normal.    Lab Results Recent Labs    09/18/23 0508 09/19/23 0508  WBC 6.3 6.8  HGB 9.9* 9.4*  HCT 31.0* 31.2*  NA 143 145  K 3.6 3.5  CL 112* 110  CO2 23 29  BUN 22 21  CREATININE 1.05 0.98   Liver Panel No results for input(s): "PROT", "ALBUMIN", "AST", "ALT", "ALKPHOS", "BILITOT", "BILIDIR", "IBILI" in the last 72 hours. Sedimentation Rate No results for input(s): "ESRSEDRATE" in the last 72 hours. C-Reactive Protein No results for input(s): "CRP" in the last 72 hours.  Microbiology:  Studies/Results: DG Abd Portable 1V  Result Date: 09/18/2023 CLINICAL DATA:  Nausea and vomiting EXAM: PORTABLE ABDOMEN - 1 VIEW COMPARISON:  Four days ago FINDINGS: Diffuse bowel gas without obstructive pattern or over distension. No abnormal stool retention. No concerning mass effect or calcification. Fiducial markers overlap the prostate. Bilateral hip replacement. IMPRESSION: Normal bowel gas pattern. Electronically Signed   By: Tiburcio Pea M.D.   On: 09/18/2023 10:21     Assessment/Plan: Perineal abscess/ fournier's gangrene s/p I x D = continue with wet to dry dressing changes. Defer to urology to whether he has another trip to OR to evaluate region for any further closure  Continue on amp/sub monotherapy.  When closer to discharge we can determine oral regimen to switch to complete course  Dr Netta Corrigan available for questions over the long weekend. Will see back on Monday.  [facility] Pager: [phone]  09/19/2023, 11:30 AM

## 2023-09-19 NOTE — Progress Notes (Signed)
5 Days Post-Op Subjective: Patient doing well, no infectious symptoms. Still having significant  pain with dressing changes.  Requires 1 mg Dilaudid for dressing changes.   Objective: Vital signs in last 24 hours: Temp:  [97.5 F (36.4 C)-99.2 F (37.3 C)] 98.6 F (37 C) (11/27 0409) Pulse Rate:  [66-99] 66 (11/27 0409) Resp:  [17-20] 17 (11/27 0409) BP: (128-151)/(70-77) 132/70 (11/27 0409) SpO2:  [92 %-100 %] 99 % (11/27 0409)  Intake/Output from previous day: 11/26 0701 - 11/27 0700 In: 610 [P.O.:500; I.V.:10; IV Piggyback:100] Out: 1550 [Urine:1550] Intake/Output this shift: No intake/output data recorded.  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 5cm deep central perineal wound packed with iodoform gauze. Pink healthy wound bed without purulent drainage or signs of necrosis. Some fibrinous slough noted, but no need fro further debridement.   Lab Results: Recent Labs    09/17/23 0329 09/18/23 0508 09/19/23 0508  HGB 9.6* 9.9* 9.4*  HCT 31.1* 31.0* 31.2*    WBC 6.8  BMET Recent Labs    09/18/23 0508 09/19/23 0508  NA 143 145  K 3.6 3.5  CL 112* 110  CO2 23 29  GLUCOSE 96 92  BUN 22 21  CREATININE 1.05 0.98  CALCIUM 9.2 8.9     Studies/Results: DG Abd Portable 1V  Result Date: 09/18/2023 CLINICAL DATA:  Nausea and vomiting EXAM: PORTABLE ABDOMEN - 1 VIEW COMPARISON:  Four days ago FINDINGS: Diffuse bowel gas without obstructive pattern or over distension. No abnormal stool retention. No concerning mass effect or calcification. Fiducial markers overlap the prostate. Bilateral hip replacement. IMPRESSION: Normal bowel gas pattern. Electronically Signed   By: Tiburcio Pea M.D.   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  Liliane Shi on 09/13/2023   Second look with [name].  Minimal debridement, washout, Gen surgery exam, and dressing change on 09/14/2023 NO infectious symptoms WBC remains normal. Abx per primary     POD #6/5.  1 mg of Dilaudid was given before beginning wound care and patient struggled significantly through physical exam and packing again. Wound edges and deep components appeared healthy, no necrotic tissue visualized, wound depth is 5cm deep no tracking.  This was repacked with iodoform gauze and covered with damp Kerlix, then dry kerlix over that.  Patient again struggled with pain tolerance during wound care, will need to stay in patient until can tolerate wound care with IV pain meds.  Urology will assist with these as available.   LOS: 6 days   Evan Brock 09/19/2023, 7:22 AM

## 2023-09-20 DIAGNOSIS — R6521 Severe sepsis with septic shock: Secondary | ICD-10-CM | POA: Diagnosis not present

## 2023-09-20 DIAGNOSIS — A419 Sepsis, unspecified organism: Secondary | ICD-10-CM | POA: Diagnosis not present

## 2023-09-20 DIAGNOSIS — Z09 Encounter for follow-up examination after completed treatment for conditions other than malignant neoplasm: Secondary | ICD-10-CM | POA: Diagnosis not present

## 2023-09-20 DIAGNOSIS — Z87438 Personal history of other diseases of male genital organs: Secondary | ICD-10-CM | POA: Diagnosis not present

## 2023-09-20 LAB — CBC
HCT: 31.2 % — ABNORMAL LOW (ref 39.0–52.0)
Hemoglobin: 9.2 g/dL — ABNORMAL LOW (ref 13.0–17.0)
MCH: 26 pg (ref 26.0–34.0)
MCHC: 29.5 g/dL — ABNORMAL LOW (ref 30.0–36.0)
MCV: 88.1 fL (ref 80.0–100.0)
Platelets: 157 10*3/uL (ref 150–400)
RBC: 3.54 MIL/uL — ABNORMAL LOW (ref 4.22–5.81)
RDW: 16 % — ABNORMAL HIGH (ref 11.5–15.5)
WBC: 6.5 10*3/uL (ref 4.0–10.5)
nRBC: 0 % (ref 0.0–0.2)

## 2023-09-20 LAB — GLUCOSE, CAPILLARY
Glucose-Capillary: 102 mg/dL — ABNORMAL HIGH (ref 70–99)
Glucose-Capillary: 124 mg/dL — ABNORMAL HIGH (ref 70–99)
Glucose-Capillary: 89 mg/dL (ref 70–99)
Glucose-Capillary: 94 mg/dL (ref 70–99)
Glucose-Capillary: 98 mg/dL (ref 70–99)
Glucose-Capillary: 99 mg/dL (ref 70–99)
Glucose-Capillary: 99 mg/dL (ref 70–99)

## 2023-09-20 LAB — BASIC METABOLIC PANEL
Anion gap: 9 (ref 5–15)
BUN: 17 mg/dL (ref 8–23)
CO2: 23 mmol/L (ref 22–32)
Calcium: 8.7 mg/dL — ABNORMAL LOW (ref 8.9–10.3)
Chloride: 108 mmol/L (ref 98–111)
Creatinine, Ser: 0.79 mg/dL (ref 0.61–1.24)
GFR, Estimated: >60 mL/min (ref >60)
Glucose, Bld: 99 mg/dL (ref 70–99)
Potassium: 3.4 mmol/L — ABNORMAL LOW (ref 3.5–5.1)
Sodium: 140 mmol/L (ref 135–145)

## 2023-09-20 MED ORDER — POTASSIUM CHLORIDE CRYS ER 20 MEQ PO TBCR
20.0000 meq | EXTENDED_RELEASE_TABLET | Freq: Once | ORAL | Status: AC
  Administered 2023-09-20: 20 meq via ORAL
  Filled 2023-09-20: qty 1

## 2023-09-20 NOTE — Progress Notes (Signed)
PROGRESS NOTE Evan Brock  LKG:[id number] DOB: 1951-07-22 DOA: 09/13/2023 PCP: Bettey Costa, NP  Brief Narrative/Hospital Course: 72 yr old male with pmh of HTN, CAD (on Brilinta), Type 2 DM (Metformin), COPD, bronchiectasis, high grade prostate cancer with recent Gold seed implant and SpaceOAR gel placement for radiation treatment on 08/14/23, asbestos exposure/chemical exposure by working in [employer] for a total of 54yrs, former smoker tobacco for a total of 8 yrs (half a pack a day), and recently quit smoking marijuana over 2 years ago. Patient originally presented to [facility] ED on 11/16 for testicular/perianal pain.  Workup revealed possibility of Fournier's gangrene.  Urology was consulted.  Patient was admitted to the ICU for septic shock.  Stabilized.  Underwent surgery.  Subsequently transferred to the floor      Subjective: Patient seen and examined this morning Overnight afebrile labs with mild hypokalemia otherwise unchanged  Patient resting comfortably in the bedside chair, has no complaint No perineal pain  Assessment and Plan: Principal Problem:   Septic shock (HCC) Active Problems:   AKI (acute kidney injury) (HCC)   Fournier's gangrene   Fournier's gangrene Septic shock due to above-now resolved. S/P exploration and debridement and packing with [name] 11/21 Second Look completed with minimal debridement and washout, no infectious symptoms currently WBC remains stable.  Continue pain management, urology following closely-advised to continue pain management and wound care along with IV antibiotics, await and defer to urology for further surgical recommendation. ID is monitoring-with plan to transition to oral antibiotics CLOSE TO DISCAHRGE.  Nausea and vomiting: Doing well, passing gas.  X-ray abdomen 11/26 normal gas pattern cont PPI and antiemetics   History of prostate cancer His radiation treatment is currently on hold    Bronchiectasis/COPD/suspected  OSA: Advised outpatient sleep apnea evaluation, currently on supplemental oxygen, keep on I-S  AKI: Resolved.   Normocytic anemia Hemoglobin remained stable in 9 g once.  Monitor  Recent Labs    09/16/23 0659 09/17/23 0329 09/18/23 0508 09/19/23 0508 09/20/23 0524  HGB 9.7* 9.6* 9.9* 9.4* 9.2*  MCV 82.7 83.6 82.2 86.0 88.1     CAD Essential hypertension: No chest pain.  Blood pressure remained stable.  Holding losartan and home Brilinta due to surgeries. Statin also on hold.  Monitor clinically.     T2DM:  Pta ON metformin at home. Cont SSI for now.  Blood sugar remains well-controlled Recent Labs  Lab 09/13/23 1321 09/13/23 1607 09/19/23 1631 09/19/23 2006 09/20/23 0022 09/20/23 0518 09/20/23 0735  GLUCAP  --    < > 81 106* 98 99 94  HGBA1C 7.0*  --   --   --   --   --   --    < > = values in this interval not displayed.    Obesity:Patient's Body mass index is 30.15 kg/m. : Will benefit with PCP follow-up, weight loss  healthy lifestyle and outpatient sleep evaluation.   DVT prophylaxis: heparin injection 5,000 Units Start: 09/13/23 2200 Code Status:   Code Status: Full Code Family Communication: plan of care discussed with patient/wife at bedside. Patient status is: Inpatient because of fourneir gangrene Level of care: Telemetry   Dispo: The patient is from: home w/ wife            Anticipated disposition: TBD Objective: Vitals last 24 hrs: Vitals:   09/19/23 2005 09/20/23 0459 09/20/23 0501 09/20/23 0850  BP: (!) 143/101 (!) 149/77    Pulse: 87 63    Resp:  20  Temp: 98 F (36.7 C) 98 F (36.7 C)    TempSrc: Oral Oral    SpO2: 99% 100%  98%  Weight:   92.6 kg   Height:       Weight change:   Physical Examination: General exam: alert awake, oriented  HEENT:Oral mucosa moist, Ear/Nose WNL grossly Respiratory system: Bilaterally clear BS,no use of accessory muscle Cardiovascular system: S1 & S2 +, No JVD. Gastrointestinal system: Abdomen  soft,NT,ND, BS+ Nervous System: Alert, awake, moving all extremities,and following commands. Extremities: LE edema neg,distal peripheral pulses palpable and warm.  Skin:No rashes,no icterus. NGE:XBMWUX muscle bulk,tone, power.  Perineal area appears dry- dressing not removed deferred to urology due to pain  Medications reviewed:  Scheduled Meds:  ascorbic acid  500 mg Oral BID   Chlorhexidine Gluconate Cloth  6 each Topical Daily   docusate sodium  100 mg Oral BID   feeding supplement  237 mL Oral BID BM   fluticasone furoate-vilanterol  1 puff Inhalation Daily   heparin  5,000 Units Subcutaneous Q8H   insulin aspart  0-15 Units Subcutaneous Q4H   multivitamin with minerals  1 tablet Oral Daily   pantoprazole  40 mg Oral BID AC   sodium chloride flush  10-40 mL Intracatheter Q12H   umeclidinium bromide  1 puff Inhalation Daily   zinc sulfate (50mg  elemental zinc)  220 mg Oral Daily   Continuous Infusions:  ampicillin-sulbactam (UNASYN) IV 3 g (09/20/23 0530)      Diet Order             DIET SOFT Room service appropriate? Yes; Fluid consistency: Thin  Diet effective now                    Nutrition Problem: Increased nutrient needs Etiology:  (Fournier's gangrene) Signs/Symptoms: estimated needs Interventions: Ensure Enlive (each supplement provides 350kcal and 20 grams of protein), MVI   Intake/Output Summary (Last 24 hours) at 09/20/2023 1110 Last data filed at 09/20/2023 0505 Gross per 24 hour  Intake 100 ml  Output 600 ml  Net -500 ml   Net IO Since Admission: -1,023.79 mL [09/20/23 1110]  Wt Readings from Last 3 Encounters:  09/20/23 92.6 kg  09/08/23 94.8 kg  08/24/23 94.8 kg     Unresulted Labs (From admission, onward)     Start     Ordered   09/20/23 0500  Basic metabolic panel  Daily,   R     Question:  Specimen collection method  Answer:  Lab=Lab collect   09/19/23 1312   09/20/23 0500  CBC  Daily,   R     Question:  Specimen collection  method  Answer:  Lab=Lab collect   09/19/23 1312   09/14/23 1131  MRSA Next Gen by PCR, Nasal  Once,   R        09/14/23 1130          Data Reviewed: I have personally reviewed following labs and imaging studies CBC: Recent Labs  Lab 09/16/23 0659 09/17/23 0329 09/18/23 0508 09/19/23 0508 09/20/23 0524  WBC 7.1 6.9 6.3 6.8 6.5  HGB 9.7* 9.6* 9.9* 9.4* 9.2*  HCT 30.7* 31.1* 31.0* 31.2* 31.2*  MCV 82.7 83.6 82.2 86.0 88.1  PLT 161 151 166 168 157   Basic Metabolic Panel:  Recent Labs  Lab 09/14/23 0249 09/15/23 0500 09/15/23 1447 09/15/23 1809 09/16/23 0659 09/16/23 1643 09/17/23 0329 09/18/23 0508 09/19/23 0508 09/20/23 0524  NA 135 135  --   --  133*  --  140 143 145 140  K 4.7 3.9  --   --  3.7  --  3.3* 3.6 3.5 3.4*  CL 107 108  --   --  107  --  112* 112* 110 108  CO2 19* 19*  --   --  22  --  22 23 29 23   GLUCOSE 140* 130*  --   --  159*  --  116* 96 92 99  BUN 42* 38*  --   --  32*  --  27* 22 21 17   CREATININE 3.30* 2.39*  --   --  1.38*  --  1.16 1.05 0.98 0.79  CALCIUM 7.6* 8.1*  --   --  8.1*  --  8.6* 9.2 8.9 8.7*  MG 1.8 2.4  --   --  2.0  --  1.7  --   --   --   PHOS 5.0*  --  2.5 2.3* 2.3* 2.0*  --   --   --   --    GFR: Estimated Creatinine Clearance: 92.5 mL/min (by C-G formula based on SCr of 0.79 mg/dL). Liver Function Tests:  No results for input(s): "AST", "ALT", "ALKPHOS", "BILITOT", "PROT", "ALBUMIN" in the last 168 hours.  Recent Results (from the past 240 hour(s))  Blood culture (routine x 2)     Status: None   Collection Time: 09/13/23  1:21 PM   Specimen: BLOOD  Result Value Ref Range Status   Specimen Description   Final    BLOOD RIGHT ANTECUBITAL Performed at [facility], [address], [place], [place] [address]    Special Requests   Final    BOTTLES DRAWN AEROBIC AND ANAEROBIC Blood Culture adequate volume Performed at [facility], [address] [place], [place] [address]    Culture    Final    NO GROWTH 5 DAYS Performed at [facility], [address], [place], [place] [address]    Report Status 09/18/2023 FINAL  Final  Blood culture (routine x 2)     Status: None   Collection Time: 09/13/23  1:21 PM   Specimen: BLOOD  Result Value Ref Range Status   Specimen Description   Final    BLOOD LEFT ANTECUBITAL Performed at [facility], [address], [place], [place] [address]    Special Requests   Final    BOTTLES DRAWN AEROBIC AND ANAEROBIC Blood Culture adequate volume Performed at [facility], [address], [place], [place] [address]    Culture   Final    NO GROWTH 5 DAYS Performed at [facility], [address] [place], [place] [address]    Report Status 09/18/2023 FINAL  Final  Aerobic/Anaerobic Culture w Gram Stain (surgical/deep wound)     Status: None   Collection Time: 09/13/23  6:53 PM   Specimen: Wound; Abscess  Result Value Ref Range Status   Specimen Description   Final    WOUND Performed at [facility], [address] [place], [place] [address]    Special Requests   Final    PERINEAL ABSCESS Performed at [facility], [address], [place], [place] [address]    Gram Stain   Final    FEW WBC PRESENT, PREDOMINANTLY PMN FEW GRAM POSITIVE COCCI IN PAIRS FEW GRAM NEGATIVE RODS    Culture   Final    RARE ENTEROCOCCUS FAECALIS RARE CUTIBACTERIUM ACNES  Standardized susceptibility testing for this organism is not available. MODERATE BACTEROIDES THETAIOTAOMICRON BETA LACTAMASE POSITIVE Performed at [facility], [address], [place], [place] [address]    Report Status 09/18/2023 FINAL  Final   Organism ID, Bacteria ENTEROCOCCUS FAECALIS  Final      Susceptibility   Enterococcus faecalis - MIC*    AMPICILLIN <=2 SENSITIVE Sensitive     VANCOMYCIN 1 SENSITIVE Sensitive     GENTAMICIN SYNERGY RESISTANT Resistant     * RARE ENTEROCOCCUS  FAECALIS    Antimicrobials: Anti-infectives (From admission, onward)    Start     Dose/Rate Route Frequency Ordered Stop   09/17/23 1600  Ampicillin-Sulbactam (UNASYN) 3 g in sodium chloride 0.9 % 100 mL IVPB        3 g 200 mL/hr over 30 Minutes Intravenous Every 6 hours 09/17/23 1406     09/17/23 0800  ceFEPIme (MAXIPIME) 2 g in sodium chloride 0.9 % 100 mL IVPB  Status:  Discontinued        2 g 200 mL/hr over 30 Minutes Intravenous Every 8 hours 09/17/23 0717 09/17/23 1406   09/16/23 1530  linezolid (ZYVOX) tablet 600 mg  Status:  Discontinued        600 mg Oral Every 12 hours 09/16/23 1441 09/19/23 0713   09/15/23 1500  vancomycin (VANCOCIN) IVPB 1000 mg/200 mL premix  Status:  Discontinued        1,000 mg 200 mL/hr over 60 Minutes Intravenous Every 48 hours 09/13/23 2020 09/14/23 0931   09/15/23 0130  linezolid (ZYVOX) tablet 600 mg  Status:  Discontinued        600 mg Per Tube Every 12 hours 09/15/23 0116 09/16/23 1441   09/14/23 1400  ceFEPIme (MAXIPIME) 2 g in sodium chloride 0.9 % 100 mL IVPB  Status:  Discontinued        2 g 200 mL/hr over 30 Minutes Intravenous Every 24 hours 09/13/23 2020 09/17/23 0717   09/14/23 1330  linezolid (ZYVOX) tablet 600 mg  Status:  Discontinued        600 mg Oral Every 12 hours 09/14/23 0931 09/15/23 0115   09/13/23 2230  clindamycin (CLEOCIN) IVPB 600 mg  Status:  Discontinued        600 mg 100 mL/hr over 30 Minutes Intravenous Every 8 hours 09/13/23 2138 09/14/23 0931   09/13/23 2200  metroNIDAZOLE (FLAGYL) IVPB 500 mg  Status:  Discontinued        500 mg 100 mL/hr over 60 Minutes Intravenous Every 12 hours 09/13/23 2020 09/17/23 1406   09/13/23 1330  vancomycin (VANCOREADY) IVPB 2000 mg/400 mL        2,000 mg 200 mL/hr over 120 Minutes Intravenous  Once 09/13/23 1324 09/13/23 2125   09/13/23 1330  ceFEPIme (MAXIPIME) 2 g in sodium chloride 0.9 % 100 mL IVPB        2 g 200 mL/hr over 30 Minutes Intravenous  Once 09/13/23 1324 09/13/23 1426    09/13/23 1300  clindamycin (CLEOCIN) IVPB 300 mg        300 mg 100 mL/hr over 30 Minutes Intravenous  Once 09/13/23 1259 09/13/23 1352      Culture/Microbiology    Component Value Date/Time   SDES  09/13/2023 1853    WOUND Performed at [facility], [address], [place], [place] [address]    SPECREQUEST  09/13/2023 1853    PERINEAL ABSCESS Performed at [facility], [address] [place],  [place] [address]    CULT  09/13/2023 1853    RARE ENTEROCOCCUS FAECALIS RARE CUTIBACTERIUM ACNES Standardized susceptibility testing for this organism is not available. MODERATE BACTEROIDES THETAIOTAOMICRON BETA LACTAMASE POSITIVE Performed at [facility], [address], [place], [place] [address]    REPTSTATUS 09/18/2023 FINAL 09/13/2023 1853    Radiology Studies: No results found.   LOS: 7 days   Total time spent in review of labs and imaging, patient evaluation, formulation of plan, documentation and communication with family: 35 minutes  Lanae Boast, MD [facility]  09/20/2023, 11:10 AM

## 2023-09-20 NOTE — Progress Notes (Signed)
Urology Inpatient Progress Note  Subjective: Patient without complaints this AM.  Sitting in chair. No fever.    Anti-infectives: Anti-infectives (From admission, onward)    Start     Dose/Rate Route Frequency Ordered Stop   09/17/23 1600  Ampicillin-Sulbactam (UNASYN) 3 g in sodium chloride 0.9 % 100 mL IVPB        3 g 200 mL/hr over 30 Minutes Intravenous Every 6 hours 09/17/23 1406     09/17/23 0800  ceFEPIme (MAXIPIME) 2 g in sodium chloride 0.9 % 100 mL IVPB  Status:  Discontinued        2 g 200 mL/hr over 30 Minutes Intravenous Every 8 hours 09/17/23 0717 09/17/23 1406   09/16/23 1530  linezolid (ZYVOX) tablet 600 mg  Status:  Discontinued        600 mg Oral Every 12 hours 09/16/23 1441 09/19/23 0713   09/15/23 1500  vancomycin (VANCOCIN) IVPB 1000 mg/200 mL premix  Status:  Discontinued        1,000 mg 200 mL/hr over 60 Minutes Intravenous Every 48 hours 09/13/23 2020 09/14/23 0931   09/15/23 0130  linezolid (ZYVOX) tablet 600 mg  Status:  Discontinued        600 mg Per Tube Every 12 hours 09/15/23 0116 09/16/23 1441   09/14/23 1400  ceFEPIme (MAXIPIME) 2 g in sodium chloride 0.9 % 100 mL IVPB  Status:  Discontinued        2 g 200 mL/hr over 30 Minutes Intravenous Every 24 hours 09/13/23 2020 09/17/23 0717   09/14/23 1330  linezolid (ZYVOX) tablet 600 mg  Status:  Discontinued        600 mg Oral Every 12 hours 09/14/23 0931 09/15/23 0115   09/13/23 2230  clindamycin (CLEOCIN) IVPB 600 mg  Status:  Discontinued        600 mg 100 mL/hr over 30 Minutes Intravenous Every 8 hours 09/13/23 2138 09/14/23 0931   09/13/23 2200  metroNIDAZOLE (FLAGYL) IVPB 500 mg  Status:  Discontinued        500 mg 100 mL/hr over 60 Minutes Intravenous Every 12 hours 09/13/23 2020 09/17/23 1406   09/13/23 1330  vancomycin (VANCOREADY) IVPB 2000 mg/400 mL        2,000 mg 200 mL/hr over 120 Minutes Intravenous  Once 09/13/23 1324 09/13/23 2125   09/13/23 1330  ceFEPIme (MAXIPIME) 2 g in sodium  chloride 0.9 % 100 mL IVPB        2 g 200 mL/hr over 30 Minutes Intravenous  Once 09/13/23 1324 09/13/23 1426   09/13/23 1300  clindamycin (CLEOCIN) IVPB 300 mg        300 mg 100 mL/hr over 30 Minutes Intravenous  Once 09/13/23 1259 09/13/23 1352       Current Facility-Administered Medications  Medication Dose Route Frequency Provider Last Rate Last Admin   acetaminophen (TYLENOL) tablet 650 mg  650 mg Oral Q6H PRN Luciano Cutter, MD   650 mg at 09/16/23 2011   Ampicillin-Sulbactam (UNASYN) 3 g in sodium chloride 0.9 % 100 mL IVPB  3 g Intravenous Q6H Luciano Cutter, MD 200 mL/hr at 09/20/23 0530 3 g at 09/20/23 0530   ascorbic acid (VITAMIN C) tablet 500 mg  500 mg Oral BID Osvaldo Shipper, MD   500 mg at 09/20/23 0932   Chlorhexidine Gluconate Cloth 2 % PADS 6 each  6 each Topical Daily Luciano Cutter, MD   6 each at 09/19/23 1008   docusate sodium (COLACE)  capsule 100 mg  100 mg Oral BID PRN Rene Paci, MD       docusate sodium (COLACE) capsule 100 mg  100 mg Oral BID Pricilla Riffle, RPH   100 mg at 09/20/23 0933   feeding supplement (ENSURE ENLIVE / ENSURE PLUS) liquid 237 mL  237 mL Oral BID BM Luciano Cutter, MD   237 mL at 09/20/23 0932   fluticasone furoate-vilanterol (BREO ELLIPTA) 200-25 MCG/ACT 1 puff  1 puff Inhalation Daily Luciano Cutter, MD   1 puff at 09/20/23 0848   heparin injection 5,000 Units  5,000 Units Subcutaneous Q8H Rene Paci, MD   5,000 Units at 09/20/23 0531   hydrALAZINE (APRESOLINE) injection 10 mg  10 mg Intravenous Q6H PRN Luciano Cutter, MD       HYDROmorphone (DILAUDID) injection 1 mg  1 mg Intravenous Q3H PRN Conrad Cassandra, MD   1 mg at 09/20/23 0809   insulin aspart (novoLOG) injection 0-15 Units  0-15 Units Subcutaneous Q4H Rene Paci, MD   2 Units at 09/19/23 0019   ipratropium-albuterol (DUONEB) 0.5-2.5 (3) MG/3ML nebulizer solution 3 mL  3 mL Nebulization Q6H PRN Rene Paci, MD   3 mL at 09/13/23 2150   lip balm (CARMEX) ointment 1 Application  1 Application Topical PRN Luciano Cutter, MD       multivitamin with minerals tablet 1 tablet  1 tablet Oral Daily Osvaldo Shipper, MD   1 tablet at 09/20/23 0932   ondansetron (ZOFRAN) injection 4 mg  4 mg Intravenous Q6H PRN Osvaldo Shipper, MD   4 mg at 09/18/23 0813   Oral care mouth rinse  15 mL Mouth Rinse PRN Luciano Cutter, MD       oxyCODONE (Oxy IR/ROXICODONE) immediate release tablet 10 mg  10 mg Oral Q4H PRN Paliwal, Aditya, MD   10 mg at 09/17/23 0055   Or   oxyCODONE (Oxy IR/ROXICODONE) immediate release tablet 15 mg  15 mg Oral Q4H PRN Paliwal, Aditya, MD   15 mg at 09/20/23 0255   pantoprazole (PROTONIX) EC tablet 40 mg  40 mg Oral BID AC Osvaldo Shipper, MD   40 mg at 09/20/23 0807   polyethylene glycol (MIRALAX / GLYCOLAX) packet 17 g  17 g Oral Daily PRN Rene Paci, MD       simethicone [facility]) chewable tablet 80 mg  80 mg Oral Q6H PRN Anthoney Harada, NP   80 mg at 09/19/23 2159   sodium chloride flush (NS) 0.9 % injection 10-40 mL  10-40 mL Intracatheter Q12H Luciano Cutter, MD   10 mL at 09/20/23 0935   sodium chloride flush (NS) 0.9 % injection 10-40 mL  10-40 mL Intracatheter PRN Luciano Cutter, MD       umeclidinium bromide (INCRUSE ELLIPTA) 62.5 MCG/ACT 1 puff  1 puff Inhalation Daily Luciano Cutter, MD   1 puff at 09/20/23 0849   zinc sulfate (50mg  elemental zinc) capsule 220 mg  220 mg Oral Daily Osvaldo Shipper, MD   220 mg at 09/20/23 0932     Objective: Vital signs in last 24 hours: Temp:  [98 F (36.7 C)] 98 F (36.7 C) (11/28 0459) Pulse Rate:  [63-87] 63 (11/28 0459) Resp:  [16-20] 20 (11/28 0459) BP: (143-149)/(77-101) 149/77 (11/28 0459) SpO2:  [98 %-100 %] 98 % (11/28 0850) Weight:  [92.6 kg] 92.6 kg (11/28 0501)  Intake/Output from previous day: 11/27 0701 - 11/28 0700 In:  100 [IV Piggyback:100] Out: 925 [Urine:925] Intake/Output this  shift: No intake/output data recorded.  GENERAL APPEARANCE:  Well appearing, well developed, well nourished, NAD CV:  RRR Chest:  Clear ABDOMEN:  Soft, non-tender EXTREMITIES:  Moves all extremities well, without clubbing, cyanosis, or edema NEUROLOGIC:  Alert and oriented x 3, CN II-XII grossly intact MENTAL STATUS:  appropriate SKIN:  Warm, dry, and intact GU:  posterior scrotal wound with some fibrinous tissue, no obvious necrosis; perineal wound repacked with iodoform gauze   Lab Results:  Recent Labs    09/19/23 0508 09/20/23 0524  WBC 6.8 6.5  HGB 9.4* 9.2*  HCT 31.2* 31.2*  PLT 168 157   BMET Recent Labs    09/19/23 0508 09/20/23 0524  NA 145 140  K 3.5 3.4*  CL 110 108  CO2 29 23  GLUCOSE 92 99  BUN 21 17  CREATININE 0.98 0.79  CALCIUM 8.9 8.7*   PT/INR No results for input(s): "LABPROT", "INR" in the last 72 hours. ABG No results for input(s): "PHART", "HCO3" in the last 72 hours.  Invalid input(s): "PCO2", "PO2"  Studies/Results: No results found.   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  WBC normal. Continue dressing changes daily.   Pain management with dressing changes remains an issue.  He is requiring Dilaudid 1 mg IV for dressing changes. Continue antibiotics   Di Kindle, MD 09/20/2023

## 2023-09-20 NOTE — Progress Notes (Signed)
Mobility Specialist - Progress Note   09/20/23 1403  Mobility  Activity Ambulated with assistance in hallway  Level of Assistance Standby assist, set-up cues, supervision of patient - no hands on  Assistive Device Front wheel walker  Distance Ambulated (ft) 120 ft  Activity Response Tolerated well  Mobility Referral Yes  $Mobility charge 1 Mobility  Mobility Specialist Start Time (ACUTE ONLY) 1342  Mobility Specialist Stop Time (ACUTE ONLY) 1402  Mobility Specialist Time Calculation (min) (ACUTE ONLY) 20 min   Pt received in bed and agreeable to mobility. Pt was minA from supine>sitting. No complaints during session. Pt to recliner after session with all needs met.    [facility]

## 2023-09-21 DIAGNOSIS — A419 Sepsis, unspecified organism: Secondary | ICD-10-CM | POA: Diagnosis not present

## 2023-09-21 DIAGNOSIS — R6521 Severe sepsis with septic shock: Secondary | ICD-10-CM | POA: Diagnosis not present

## 2023-09-21 DIAGNOSIS — Z87438 Personal history of other diseases of male genital organs: Secondary | ICD-10-CM | POA: Diagnosis not present

## 2023-09-21 DIAGNOSIS — Z09 Encounter for follow-up examination after completed treatment for conditions other than malignant neoplasm: Secondary | ICD-10-CM | POA: Diagnosis not present

## 2023-09-21 LAB — CBC
HCT: 29.6 % — ABNORMAL LOW (ref 39.0–52.0)
Hemoglobin: 9.2 g/dL — ABNORMAL LOW (ref 13.0–17.0)
MCH: 26.1 pg (ref 26.0–34.0)
MCHC: 31.1 g/dL (ref 30.0–36.0)
MCV: 84.1 fL (ref 80.0–100.0)
Platelets: 177 10*3/uL (ref 150–400)
RBC: 3.52 MIL/uL — ABNORMAL LOW (ref 4.22–5.81)
RDW: 15.7 % — ABNORMAL HIGH (ref 11.5–15.5)
WBC: 7.1 10*3/uL (ref 4.0–10.5)
nRBC: 0.3 % — ABNORMAL HIGH (ref 0.0–0.2)

## 2023-09-21 LAB — GLUCOSE, CAPILLARY
Glucose-Capillary: 108 mg/dL — ABNORMAL HIGH (ref 70–99)
Glucose-Capillary: 115 mg/dL — ABNORMAL HIGH (ref 70–99)
Glucose-Capillary: 87 mg/dL (ref 70–99)
Glucose-Capillary: 89 mg/dL (ref 70–99)
Glucose-Capillary: 98 mg/dL (ref 70–99)
Glucose-Capillary: 99 mg/dL (ref 70–99)

## 2023-09-21 LAB — BASIC METABOLIC PANEL
Anion gap: 8 (ref 5–15)
BUN: 17 mg/dL (ref 8–23)
CO2: 28 mmol/L (ref 22–32)
Calcium: 8.8 mg/dL — ABNORMAL LOW (ref 8.9–10.3)
Chloride: 106 mmol/L (ref 98–111)
Creatinine, Ser: 0.92 mg/dL (ref 0.61–1.24)
GFR, Estimated: >60 mL/min (ref >60)
Glucose, Bld: 120 mg/dL — ABNORMAL HIGH (ref 70–99)
Potassium: 3.1 mmol/L — ABNORMAL LOW (ref 3.5–5.1)
Sodium: 142 mmol/L (ref 135–145)

## 2023-09-21 NOTE — Plan of Care (Signed)
  Problem: Education: Goal: Ability to describe self-care measures that may prevent or decrease complications (Diabetes Survival Skills Education) will improve Outcome: Progressing   Problem: Coping: Goal: Ability to adjust to condition or change in health will improve Outcome: Progressing   Problem: Fluid Volume: Goal: Ability to maintain a balanced intake and output will improve Outcome: Progressing   

## 2023-09-21 NOTE — Progress Notes (Signed)
Occupational Therapy Treatment Patient Details Name: Evan Brock MRN: [id number] DOB: [birth year] Today's Date: 09/21/2023   History of present illness 72 yr old malepresented to [facility] ED on 11/16 for testicular/perianal pain. Patient was admitted to the ICU for septic shock. Workup revealed possibility of Fournier's gangrene.  s/p Exploration, debridement, and packing  on 09/13/2023.  Pt with pmh of HTN, CAD (on Brilinta), Type 2 DM (Metformin), COPD, bronchiectasis, high grade prostate cancer with recent Gold seed implant and SpaceOAR gel placement for radiation treatment on 08/14/23, asbestos exposure/chemical exposure by working in [employer] for a total of 78yrs, former smoker tobacco for a total of 8 yrs (half a pack a day), and recently quit smoking marijuana over 2 years ago.   OT comments  Pt sitting up in recliner upon therapy arrival and agreeable to participate in OT treatment session. Session focused on patient education regarding use of AE to increase functional performance during LB ADL tasks. Pt was provided with handout regarding AE and online resource for purchase. Pt required VC for technique for AE use. Discharge plan continues to be appropriate. OT will continue to follow patient acutely.       If plan is discharge home, recommend the following:  A little help with walking and/or transfers;A lot of help with bathing/dressing/bathroom;Assistance with cooking/housework;Direct supervision/assist for medications management;Direct supervision/assist for financial management;Assist for transportation;Help with stairs or ramp for entrance   Equipment Recommendations  BSC/3in1       Precautions / Restrictions Precautions Precautions: Fall Restrictions Weight Bearing Restrictions: No              ADL either performed or assessed with clinical judgement   ADL    Lower Body Dressing: Moderate assistance;Sitting/lateral leans (Educated on use of AE to assist with LB dressing  tasks (reacher, sock aid, Long handled shoe horn))      General ADL Comments: Pt educated on use of AE for LB bathing and dressing tasks. Education provided with visual demonstration while using reacher, sock aid, long handled shoe horn, and Long handled sponge. Pt verbalize understanding. returned demonstration with mod A for technique. Handout provided for reference with online resource for puchasing items.      Cognition Arousal: Alert Behavior During Therapy: WFL for tasks assessed/performed Overall Cognitive Status: Within Functional Limits for tasks assessed                          Pertinent Vitals/ Pain       Pain Assessment Pain Assessment: Faces Faces Pain Scale: No hurt         Frequency  Min 1X/week        Progress Toward Goals  OT Goals(current goals can now be found in the care plan section)  Progress towards OT goals: Progressing toward goals            AM-PAC OT "6 Clicks" Daily Activity     Outcome Measure   Help from another person eating meals?: None Help from another person taking care of personal grooming?: A Little Help from another person toileting, which includes using toliet, bedpan, or urinal?: Total Help from another person bathing (including washing, rinsing, drying)?: A Lot Help from another person to put on and taking off regular upper body clothing?: A Little Help from another person to put on and taking off regular lower body clothing?: A Lot 6 Click Score: 15    End of Session Equipment Utilized During Treatment: Other (comment) (  Adaptive equipment for LB ADL)  OT Visit Diagnosis: Unsteadiness on feet (R26.81);Other abnormalities of gait and mobility (R26.89);Pain;Muscle weakness (generalized) (M62.81);Other symptoms and signs involving cognitive function   Activity Tolerance Patient tolerated treatment well   Patient Left in chair;with call bell/phone within reach;with chair alarm set           Time: 1308-6578 OT  Time Calculation (min): 16 min  Charges: OT General Charges $OT Visit: 1 Visit OT Treatments $Self Care/Home Management : 8-22 mins  Evan Brock, OTR/L,CBIS  Supplemental OT - [facility] and [facility] Secure Chat Preferred    Evan Brock, Evan Brock 09/21/2023, 3:39 PM

## 2023-09-21 NOTE — Progress Notes (Signed)
PROGRESS NOTE Shun Maruna  NUU:[id number] DOB: 04/24/1951 DOA: 09/13/2023 PCP: Bettey Costa, NP  Brief Narrative/Hospital Course: 72 yr old male with pmh of HTN, CAD (on Brilinta), Type 2 DM (Metformin), COPD, bronchiectasis, high grade prostate cancer with recent Gold seed implant and SpaceOAR gel placement for radiation treatment on 08/14/23, asbestos exposure/chemical exposure by working in [employer] for a total of 71yrs, former smoker tobacco for a total of 8 yrs (half a pack a day), and recently quit smoking marijuana over 2 years ago. Patient originally presented to [facility] ED on 11/16 for testicular/perianal pain.  Workup revealed possibility of Fournier's gangrene.  Urology was consulted.  Patient was admitted to the ICU for septic shock.  Stabilized.  Underwent surgery.  Subsequently transferred to the floor      Subjective: Seen and examined, Overnight vitals/labs/events reviewed  -Overnight patient has been afebrile BP stable Labs shows hypokalemia 3.1 stable CBC On bedside chair no complaints  Assessment and Plan: Principal Problem:   Septic shock (HCC) Active Problems:   AKI (acute kidney injury) (HCC)   Fournier's gangrene   Fournier's gangrene Septic shock due to above-now resolved. S/P exploration and debridement and packing with [name] 11/21 Second Look completed with minimal debridement and washout, no infectious symptoms remains afebrile WBC stable.  Posterior scrotal wound with some fibrinous tissue no obvious necrosis perineal wound repacked with iodoform gauze by urology 11/28.  Continue dressing change daily, pain management and antibiotics as per ID and urology. Defer to urology for further surgical recommendation. ID is monitoring-with plan to transition to oral antibiotics CLOSE TO DISCAHRGE.  Nausea and vomiting: Doing well, passing gas.  X-ray abdomen 11/26 normal gas pattern cont PPI and antiemetics   History of prostate cancer His radiation treatment is  currently on hold    Bronchiectasis/COPD/suspected OSA: Advised outpatient sleep apnea evaluation, currently on supplemental oxygen, keep on I-S  AKI: Resolved.   Normocytic anemia Hemoglobin remained stable in 9 g once.  Monitor  Recent Labs    09/17/23 0329 09/18/23 0508 09/19/23 0508 09/20/23 0524 09/21/23 0523  HGB 9.6* 9.9* 9.4* 9.2* 9.2*  MCV 83.6 82.2 86.0 88.1 84.1   Hypokalemia: Replacing oral    CAD Essential hypertension: No chest pain BP stable.  Holding losartan and home Brilinta due to surgeries (will resume once okay with urology) statin also on hold.  Monitor clinically.     T2DM: PTA on metformin at home.  Blood sugar well-controlled on SSI.  Recent Labs  Lab 09/20/23 1627 09/20/23 2016 09/20/23 2333 09/21/23 0414 09/21/23 0746  GLUCAP 99 124* 89 98 87    Obesity:Patient's Body mass index is 29.34 kg/m. : Will benefit with PCP follow-up, weight loss  healthy lifestyle and outpatient sleep evaluation.   DVT prophylaxis: heparin injection 5,000 Units Start: 09/13/23 2200 Code Status:   Code Status: Full Code Family Communication: plan of care discussed with patient/wife at bedside. Patient status is: Inpatient because of fourneir gangrene Level of care: Telemetry   Dispo: The patient is from: home w/ wife            Anticipated disposition: TBD Objective: Vitals last 24 hrs: Vitals:   09/20/23 2013 09/21/23 0411 09/21/23 0500 09/21/23 0607  BP: 131/66 (!) 169/90    Pulse: (!) 105 88    Resp: 16 (!) 21    Temp: 98.6 F (37 C) 98.8 F (37.1 C)    TempSrc: Oral Oral    SpO2: 92% 95%  94%  Weight:  90.1 kg   Height:       Weight change: -3.87 kg  Physical Examination: General exam: alert awake HEENT:Oral mucosa moist, Ear/Nose WNL grossly Respiratory system: Bilaterally clear BS,no use of accessory muscle Cardiovascular system: S1 & S2 +, No JVD. Gastrointestinal system: Abdomen soft,NT,ND, BS+ Nervous System: Alert, awake, moving  all extremities,and following commands. Extremities: LE edema neg,distal peripheral pulses palpable and warm.  Skin: No rashes,no icterus. MSK: Normal muscle bulk,tone, power  Did not examine perineal area, urology monitoring  Medications reviewed:  Scheduled Meds:  ascorbic acid  500 mg Oral BID   Chlorhexidine Gluconate Cloth  6 each Topical Daily   docusate sodium  100 mg Oral BID   feeding supplement  237 mL Oral BID BM   fluticasone furoate-vilanterol  1 puff Inhalation Daily   heparin  5,000 Units Subcutaneous Q8H   insulin aspart  0-15 Units Subcutaneous Q4H   multivitamin with minerals  1 tablet Oral Daily   pantoprazole  40 mg Oral BID AC   sodium chloride flush  10-40 mL Intracatheter Q12H   umeclidinium bromide  1 puff Inhalation Daily   zinc sulfate (50mg  elemental zinc)  220 mg Oral Daily   Continuous Infusions:  ampicillin-sulbactam (UNASYN) IV 3 g (09/21/23 0513)    Diet Order             DIET SOFT Room service appropriate? Yes; Fluid consistency: Thin  Diet effective now                  Nutrition Problem: Increased nutrient needs Etiology:  (Fournier's gangrene) Signs/Symptoms: estimated needs Interventions: Ensure Enlive (each supplement provides 350kcal and 20 grams of protein), MVI   Intake/Output Summary (Last 24 hours) at 09/21/2023 0902 Last data filed at 09/20/2023 2245 Gross per 24 hour  Intake 860 ml  Output 800 ml  Net 60 ml   Net IO Since Admission: -963.79 mL [09/21/23 0902]  Wt Readings from Last 3 Encounters:  09/21/23 90.1 kg  09/08/23 94.8 kg  08/24/23 94.8 kg     Unresulted Labs (From admission, onward)     Start     Ordered   09/20/23 0500  Basic metabolic panel  Daily,   R     Question:  Specimen collection method  Answer:  Lab=Lab collect   09/19/23 1312   09/20/23 0500  CBC  Daily,   R     Question:  Specimen collection method  Answer:  Lab=Lab collect   09/19/23 1312          Data Reviewed: I have personally  reviewed following labs and imaging studies CBC: Recent Labs  Lab 09/17/23 0329 09/18/23 0508 09/19/23 0508 09/20/23 0524 09/21/23 0523  WBC 6.9 6.3 6.8 6.5 7.1  HGB 9.6* 9.9* 9.4* 9.2* 9.2*  HCT 31.1* 31.0* 31.2* 31.2* 29.6*  MCV 83.6 82.2 86.0 88.1 84.1  PLT 151 166 168 157 177   Basic Metabolic Panel:  Recent Labs  Lab 09/15/23 0500 09/15/23 1447 09/15/23 1809 09/16/23 0659 09/16/23 1643 09/17/23 0329 09/18/23 0508 09/19/23 0508 09/20/23 0524 09/21/23 0523  NA 135  --   --  133*  --  140 143 145 140 142  K 3.9  --   --  3.7  --  3.3* 3.6 3.5 3.4* 3.1*  CL 108  --   --  107  --  112* 112* 110 108 106  CO2 19*  --   --  22  --  22 23  29 23 28   GLUCOSE 130*  --   --  159*  --  116* 96 92 99 120*  BUN 38*  --   --  32*  --  27* 22 21 17 17   CREATININE 2.39*  --   --  1.38*  --  1.16 1.05 0.98 0.79 0.92  CALCIUM 8.1*  --   --  8.1*  --  8.6* 9.2 8.9 8.7* 8.8*  MG 2.4  --   --  2.0  --  1.7  --   --   --   --   PHOS  --  2.5 2.3* 2.3* 2.0*  --   --   --   --   --    GFR: Estimated Creatinine Clearance: 79.4 mL/min (by C-G formula based on SCr of 0.92 mg/dL). Liver Function Tests:  No results for input(s): "AST", "ALT", "ALKPHOS", "BILITOT", "PROT", "ALBUMIN" in the last 168 hours.  Recent Results (from the past 240 hour(s))  Blood culture (routine x 2)     Status: None   Collection Time: 09/13/23  1:21 PM   Specimen: BLOOD  Result Value Ref Range Status   Specimen Description   Final    BLOOD RIGHT ANTECUBITAL Performed at [facility], [address], [place], [place] [address]    Special Requests   Final    BOTTLES DRAWN AEROBIC AND ANAEROBIC Blood Culture adequate volume Performed at [facility], [address] [place], [place] [address]    Culture   Final    NO GROWTH 5 DAYS Performed at [facility], [address] [place], [place] [address]    Report Status 09/18/2023 FINAL  Final  Blood culture (routine x  2)     Status: None   Collection Time: 09/13/23  1:21 PM   Specimen: BLOOD  Result Value Ref Range Status   Specimen Description   Final    BLOOD LEFT ANTECUBITAL Performed at [facility], [address] [place], [place] [address]    Special Requests   Final    BOTTLES DRAWN AEROBIC AND ANAEROBIC Blood Culture adequate volume Performed at [facility], [address] [place], [place] [address]    Culture   Final    NO GROWTH 5 DAYS Performed at [facility], [address] [place], [place] [address]    Report Status 09/18/2023 FINAL  Final  Aerobic/Anaerobic Culture w Gram Stain (surgical/deep wound)     Status: None   Collection Time: 09/13/23  6:53 PM   Specimen: Wound; Abscess  Result Value Ref Range Status   Specimen Description   Final    WOUND Performed at [facility], [address], [place], [place] [address]    Special Requests   Final    PERINEAL ABSCESS Performed at [facility], [address], [place], [place] [address]    Gram Stain   Final    FEW WBC PRESENT, PREDOMINANTLY PMN FEW GRAM POSITIVE COCCI IN PAIRS FEW GRAM NEGATIVE RODS    Culture   Final    RARE ENTEROCOCCUS FAECALIS RARE CUTIBACTERIUM ACNES Standardized susceptibility testing for this organism is not available. MODERATE BACTEROIDES THETAIOTAOMICRON BETA LACTAMASE POSITIVE Performed at [facility], [address] [place], [place] [address]    Report Status 09/18/2023 FINAL  Final   Organism ID, Bacteria ENTEROCOCCUS FAECALIS  Final      Susceptibility   Enterococcus  faecalis - MIC*    AMPICILLIN <=2 SENSITIVE Sensitive     VANCOMYCIN 1 SENSITIVE Sensitive     GENTAMICIN SYNERGY RESISTANT Resistant     * RARE ENTEROCOCCUS FAECALIS    Antimicrobials: Anti-infectives (From admission, onward)    Start     Dose/Rate Route Frequency Ordered Stop   09/17/23 1600  Ampicillin-Sulbactam (UNASYN) 3 g  in sodium chloride 0.9 % 100 mL IVPB        3 g 200 mL/hr over 30 Minutes Intravenous Every 6 hours 09/17/23 1406     09/17/23 0800  ceFEPIme (MAXIPIME) 2 g in sodium chloride 0.9 % 100 mL IVPB  Status:  Discontinued        2 g 200 mL/hr over 30 Minutes Intravenous Every 8 hours 09/17/23 0717 09/17/23 1406   09/16/23 1530  linezolid (ZYVOX) tablet 600 mg  Status:  Discontinued        600 mg Oral Every 12 hours 09/16/23 1441 09/19/23 0713   09/15/23 1500  vancomycin (VANCOCIN) IVPB 1000 mg/200 mL premix  Status:  Discontinued        1,000 mg 200 mL/hr over 60 Minutes Intravenous Every 48 hours 09/13/23 2020 09/14/23 0931   09/15/23 0130  linezolid (ZYVOX) tablet 600 mg  Status:  Discontinued        600 mg Per Tube Every 12 hours 09/15/23 0116 09/16/23 1441   09/14/23 1400  ceFEPIme (MAXIPIME) 2 g in sodium chloride 0.9 % 100 mL IVPB  Status:  Discontinued        2 g 200 mL/hr over 30 Minutes Intravenous Every 24 hours 09/13/23 2020 09/17/23 0717   09/14/23 1330  linezolid (ZYVOX) tablet 600 mg  Status:  Discontinued        600 mg Oral Every 12 hours 09/14/23 0931 09/15/23 0115   09/13/23 2230  clindamycin (CLEOCIN) IVPB 600 mg  Status:  Discontinued        600 mg 100 mL/hr over 30 Minutes Intravenous Every 8 hours 09/13/23 2138 09/14/23 0931   09/13/23 2200  metroNIDAZOLE (FLAGYL) IVPB 500 mg  Status:  Discontinued        500 mg 100 mL/hr over 60 Minutes Intravenous Every 12 hours 09/13/23 2020 09/17/23 1406   09/13/23 1330  vancomycin (VANCOREADY) IVPB 2000 mg/400 mL        2,000 mg 200 mL/hr over 120 Minutes Intravenous  Once 09/13/23 1324 09/13/23 2125   09/13/23 1330  ceFEPIme (MAXIPIME) 2 g in sodium chloride 0.9 % 100 mL IVPB        2 g 200 mL/hr over 30 Minutes Intravenous  Once 09/13/23 1324 09/13/23 1426   09/13/23 1300  clindamycin (CLEOCIN) IVPB 300 mg        300 mg 100 mL/hr over 30 Minutes Intravenous  Once 09/13/23 1259 09/13/23 1352      Culture/Microbiology     Component Value Date/Time   SDES  09/13/2023 1853    WOUND Performed at [facility], [address] [place], [place] [address]    SPECREQUEST  09/13/2023 1853    PERINEAL ABSCESS Performed at [facility], [address] [place], [place] [address]    CULT  09/13/2023 1853    RARE ENTEROCOCCUS FAECALIS RARE CUTIBACTERIUM ACNES Standardized susceptibility testing for this organism is not available. MODERATE BACTEROIDES THETAIOTAOMICRON BETA LACTAMASE POSITIVE Performed at [facility], [address], [place], [place] [address]    REPTSTATUS 09/18/2023 FINAL 09/13/2023 1853  Radiology Studies: No results found.   LOS: 8 days   Total time spent in review of labs and imaging, patient evaluation, formulation of plan, documentation and communication with family: 35 minutes  Lanae Boast, MD [facility]  09/21/2023, 9:02 AM

## 2023-09-21 NOTE — Progress Notes (Signed)
Urology Inpatient Progress Note  Subjective: No complaints today.  No fever or chills.  Anti-infectives: Anti-infectives (From admission, onward)    Start     Dose/Rate Route Frequency Ordered Stop   09/17/23 1600  Ampicillin-Sulbactam (UNASYN) 3 g in sodium chloride 0.9 % 100 mL IVPB        3 g 200 mL/hr over 30 Minutes Intravenous Every 6 hours 09/17/23 1406     09/17/23 0800  ceFEPIme (MAXIPIME) 2 g in sodium chloride 0.9 % 100 mL IVPB  Status:  Discontinued        2 g 200 mL/hr over 30 Minutes Intravenous Every 8 hours 09/17/23 0717 09/17/23 1406   09/16/23 1530  linezolid (ZYVOX) tablet 600 mg  Status:  Discontinued        600 mg Oral Every 12 hours 09/16/23 1441 09/19/23 0713   09/15/23 1500  vancomycin (VANCOCIN) IVPB 1000 mg/200 mL premix  Status:  Discontinued        1,000 mg 200 mL/hr over 60 Minutes Intravenous Every 48 hours 09/13/23 2020 09/14/23 0931   09/15/23 0130  linezolid (ZYVOX) tablet 600 mg  Status:  Discontinued        600 mg Per Tube Every 12 hours 09/15/23 0116 09/16/23 1441   09/14/23 1400  ceFEPIme (MAXIPIME) 2 g in sodium chloride 0.9 % 100 mL IVPB  Status:  Discontinued        2 g 200 mL/hr over 30 Minutes Intravenous Every 24 hours 09/13/23 2020 09/17/23 0717   09/14/23 1330  linezolid (ZYVOX) tablet 600 mg  Status:  Discontinued        600 mg Oral Every 12 hours 09/14/23 0931 09/15/23 0115   09/13/23 2230  clindamycin (CLEOCIN) IVPB 600 mg  Status:  Discontinued        600 mg 100 mL/hr over 30 Minutes Intravenous Every 8 hours 09/13/23 2138 09/14/23 0931   09/13/23 2200  metroNIDAZOLE (FLAGYL) IVPB 500 mg  Status:  Discontinued        500 mg 100 mL/hr over 60 Minutes Intravenous Every 12 hours 09/13/23 2020 09/17/23 1406   09/13/23 1330  vancomycin (VANCOREADY) IVPB 2000 mg/400 mL        2,000 mg 200 mL/hr over 120 Minutes Intravenous  Once 09/13/23 1324 09/13/23 2125   09/13/23 1330  ceFEPIme (MAXIPIME) 2 g in sodium chloride 0.9 % 100 mL IVPB         2 g 200 mL/hr over 30 Minutes Intravenous  Once 09/13/23 1324 09/13/23 1426   09/13/23 1300  clindamycin (CLEOCIN) IVPB 300 mg        300 mg 100 mL/hr over 30 Minutes Intravenous  Once 09/13/23 1259 09/13/23 1352       Current Facility-Administered Medications  Medication Dose Route Frequency Provider Last Rate Last Admin   acetaminophen (TYLENOL) tablet 650 mg  650 mg Oral Q6H PRN Luciano Cutter, MD   650 mg at 09/16/23 2011   Ampicillin-Sulbactam (UNASYN) 3 g in sodium chloride 0.9 % 100 mL IVPB  3 g Intravenous Q6H Luciano Cutter, MD 200 mL/hr at 09/21/23 1208 3 g at 09/21/23 1208   ascorbic acid (VITAMIN C) tablet 500 mg  500 mg Oral BID Osvaldo Shipper, MD   500 mg at 09/21/23 6045   Chlorhexidine Gluconate Cloth 2 % PADS 6 each  6 each Topical Daily Luciano Cutter, MD   6 each at 09/21/23 1253   docusate sodium (COLACE) capsule 100 mg  100  mg Oral BID PRN Rene Paci, MD       docusate sodium (COLACE) capsule 100 mg  100 mg Oral BID Pricilla Riffle, RPH   100 mg at 09/21/23 0839   feeding supplement (ENSURE ENLIVE / ENSURE PLUS) liquid 237 mL  237 mL Oral BID BM Luciano Cutter, MD   237 mL at 09/21/23 1300   fluticasone furoate-vilanterol (BREO ELLIPTA) 200-25 MCG/ACT 1 puff  1 puff Inhalation Daily Luciano Cutter, MD   1 puff at 09/21/23 0605   heparin injection 5,000 Units  5,000 Units Subcutaneous Q8H Rene Paci, MD   5,000 Units at 09/21/23 1258   hydrALAZINE (APRESOLINE) injection 10 mg  10 mg Intravenous Q6H PRN Luciano Cutter, MD       HYDROmorphone (DILAUDID) injection 1 mg  1 mg Intravenous Q3H PRN Paliwal, Aditya, MD   1 mg at 09/21/23 1410   insulin aspart (novoLOG) injection 0-15 Units  0-15 Units Subcutaneous Q4H Rene Paci, MD   2 Units at 09/20/23 2049   ipratropium-albuterol (DUONEB) 0.5-2.5 (3) MG/3ML nebulizer solution 3 mL  3 mL Nebulization Q6H PRN Rene Paci, MD   3 mL at 09/13/23 2150    lip balm (CARMEX) ointment 1 Application  1 Application Topical PRN Luciano Cutter, MD       multivitamin with minerals tablet 1 tablet  1 tablet Oral Daily Osvaldo Shipper, MD   1 tablet at 09/21/23 0839   ondansetron (ZOFRAN) injection 4 mg  4 mg Intravenous Q6H PRN Osvaldo Shipper, MD   4 mg at 09/18/23 0813   Oral care mouth rinse  15 mL Mouth Rinse PRN Luciano Cutter, MD       oxyCODONE (Oxy IR/ROXICODONE) immediate release tablet 10 mg  10 mg Oral Q4H PRN Paliwal, Aditya, MD   10 mg at 09/17/23 0055   Or   oxyCODONE (Oxy IR/ROXICODONE) immediate release tablet 15 mg  15 mg Oral Q4H PRN Paliwal, Eliezer Lofts, MD   15 mg at 09/21/23 0939   pantoprazole (PROTONIX) EC tablet 40 mg  40 mg Oral BID AC Osvaldo Shipper, MD   40 mg at 09/21/23 0839   polyethylene glycol (MIRALAX / GLYCOLAX) packet 17 g  17 g Oral Daily PRN Rene Paci, MD       simethicone [facility]) chewable tablet 80 mg  80 mg Oral Q6H PRN Anthoney Harada, NP   80 mg at 09/19/23 2159   sodium chloride flush (NS) 0.9 % injection 10-40 mL  10-40 mL Intracatheter Q12H Luciano Cutter, MD   10 mL at 09/21/23 0840   sodium chloride flush (NS) 0.9 % injection 10-40 mL  10-40 mL Intracatheter PRN Luciano Cutter, MD       umeclidinium bromide (INCRUSE ELLIPTA) 62.5 MCG/ACT 1 puff  1 puff Inhalation Daily Luciano Cutter, MD   1 puff at 09/21/23 0606   zinc sulfate (50mg  elemental zinc) capsule 220 mg  220 mg Oral Daily Osvaldo Shipper, MD   220 mg at 09/21/23 0839     Objective: Vital signs in last 24 hours: Temp:  [98.3 F (36.8 C)-98.8 F (37.1 C)] 98.3 F (36.8 C) (11/29 1155) Pulse Rate:  [64-105] 64 (11/29 1155) Resp:  [16-21] 18 (11/29 1155) BP: (131-169)/(66-90) 141/77 (11/29 1155) SpO2:  [92 %-97 %] 97 % (11/29 1155) Weight:  [90.1 kg] 90.1 kg (11/29 0500)  Intake/Output from previous day: 11/28 0701 - 11/29 0700 In: 860 [P.O.:360;  IV Piggyback:500] Out: 800 [Urine:800] Intake/Output this  shift: Total I/O In: -  Out: 600 [Urine:600]  GENERAL APPEARANCE:  Well appearing, well developed, well nourished, NAD HEENT:  Atraumatic, normocephalic, oropharynx clear NECK:  Supple without lymphadenopathy or thyromegaly ABDOMEN:  Soft, non-tender, no masses EXTREMITIES:  Moves all extremities well, without clubbing, cyanosis, or edema NEUROLOGIC:  Alert and oriented x 3, normal gait, CN II-XII grossly intact MENTAL STATUS:  appropriate BACK:  Non-tender to palpation, No CVAT SKIN:  Warm, dry, and intact GU:  perineal wound packing changed; no purulent drainage noted; granulation tissue; posterior scrotal wound with fibrinous tissue   Lab Results:  Recent Labs    09/20/23 0524 09/21/23 0523  WBC 6.5 7.1  HGB 9.2* 9.2*  HCT 31.2* 29.6*  PLT 157 177   BMET Recent Labs    09/20/23 0524 09/21/23 0523  NA 140 142  K 3.4* 3.1*  CL 108 106  CO2 23 28  GLUCOSE 99 120*  BUN 17 17  CREATININE 0.79 0.92  CALCIUM 8.7* 8.8*   PT/INR No results for input(s): "LABPROT", "INR" in the last 72 hours. ABG No results for input(s): "PHART", "HCO3" in the last 72 hours.  Invalid input(s): "PCO2", "PO2"  Studies/Results: No results found.   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 Continue dressing changes daily.   Pain management with dressing changes seems to be improving.  Still getting dilaudid 1 mg prn. Continue antibiotics   Di Kindle, MD 09/21/2023

## 2023-09-21 NOTE — Progress Notes (Signed)
Physical Therapy Treatment Patient Details Name: Evan Brock MRN: [id number] DOB: January 23, 1951 Today's Date: 09/21/2023   History of Present Illness 72 yr old malepresented to [facility] ED on 11/16 for testicular/perianal pain. Patient was admitted to the ICU for septic shock. Workup revealed possibility of Fournier's gangrene.  s/p Exploration, debridement, and packing  on 09/13/2023.  Pt with pmh of HTN, CAD (on Brilinta), Type 2 DM (Metformin), COPD, bronchiectasis, high grade prostate cancer with recent Gold seed implant and SpaceOAR gel placement for radiation treatment on 08/14/23, asbestos exposure/chemical exposure by working in [employer] for a total of 44yrs, former smoker tobacco for a total of 8 yrs (half a pack a day), and recently quit smoking marijuana over 2 years ago.    PT Comments  Pt is progressing well with mobility, he ambulated 150' with RW, no loss of balance, HR 122 max, SpO2 91% on room air walking. Cognition is much improved since last visit, he is now oriented to month/year, location, and situation.     If plan is discharge home, recommend the following: A little help with walking and/or transfers;A little help with bathing/dressing/bathroom;Assistance with cooking/housework;Help with stairs or ramp for entrance;Direct supervision/assist for medications management;Direct supervision/assist for financial management   Can travel by private vehicle        Equipment Recommendations  Rolling walker (2 wheels)    Recommendations for Other Services       Precautions / Restrictions Precautions Precautions: Fall Restrictions Weight Bearing Restrictions: No     Mobility  Bed Mobility               General bed mobility comments: up in recliner    Transfers Overall transfer level: Needs assistance Equipment used: Rolling walker (2 wheels) Transfers: Sit to/from Stand Sit to Stand: Supervision           General transfer comment: increased time from elevated  bed and chair, cues for hand placement and to control descent to chair    Ambulation/Gait Ambulation/Gait assistance: Supervision Gait Distance (Feet): 150 Feet Assistive device: Rolling walker (2 wheels) Gait Pattern/deviations: Step-through pattern, Decreased stride length Gait velocity: decr     General Gait Details: slow cadence, no loss of balance, HR 122 max, SpO2 91% on room air   Stairs             Wheelchair Mobility     Tilt Bed    Modified Rankin (Stroke Patients Only)       Balance Overall balance assessment: Needs assistance Sitting-balance support: Feet supported, No upper extremity supported Sitting balance-Leahy Scale: Good     Standing balance support: Bilateral upper extremity supported, During functional activity, Reliant on assistive device for balance Standing balance-Leahy Scale: Fair                              Cognition Arousal: Alert Behavior During Therapy: WFL for tasks assessed/performed Overall Cognitive Status: Within Functional Limits for tasks assessed                                 General Comments: now oriented to location, month/year, situation        Exercises      General Comments        Pertinent Vitals/Pain Pain Assessment Pain Assessment: No/denies pain Faces Pain Scale: No hurt Pain Intervention(s): Premedicated before session    Home Living  Prior Function            PT Goals (current goals can now be found in the care plan section) Acute Rehab PT Goals Patient Stated Goal: likes to watch tv, hang out with 7 grandkids PT Goal Formulation: With patient/family Time For Goal Achievement: 10/02/23 Potential to Achieve Goals: Good Progress towards PT goals: Progressing toward goals    Frequency    Min 1X/week      PT Plan      Co-evaluation              AM-PAC PT "6 Clicks" Mobility   Outcome Measure  Help needed  turning from your back to your side while in a flat bed without using bedrails?: A Little Help needed moving from lying on your back to sitting on the side of a flat bed without using bedrails?: A Little Help needed moving to and from a bed to a chair (including a wheelchair)?: A Little Help needed standing up from a chair using your arms (e.g., wheelchair or bedside chair)?: A Little Help needed to walk in hospital room?: A Little Help needed climbing 3-5 steps with a railing? : A Little 6 Click Score: 18    End of Session Equipment Utilized During Treatment: Gait belt Activity Tolerance: Patient tolerated treatment well Patient left: with call bell/phone within reach;in chair;with family/visitor present Nurse Communication: Mobility status PT Visit Diagnosis: Difficulty in walking, not elsewhere classified (R26.2)     Time: 8295-6213 PT Time Calculation (min) (ACUTE ONLY): 21 min  Charges:    $Gait Training: 8-22 mins PT General Charges $$ ACUTE PT VISIT: 1 Visit                     Tamala Ser PT 09/21/2023  Acute Rehabilitation Services  Office [phone]

## 2023-09-22 DIAGNOSIS — A419 Sepsis, unspecified organism: Secondary | ICD-10-CM | POA: Diagnosis not present

## 2023-09-22 DIAGNOSIS — R6521 Severe sepsis with septic shock: Secondary | ICD-10-CM | POA: Diagnosis not present

## 2023-09-22 LAB — HEPATIC FUNCTION PANEL
ALT: 50 U/L — ABNORMAL HIGH (ref 0–44)
AST: 28 U/L (ref 15–41)
Albumin: 3.1 g/dL — ABNORMAL LOW (ref 3.5–5.0)
Alkaline Phosphatase: 56 U/L (ref 38–126)
Bilirubin, Direct: <0.1 mg/dL (ref 0.0–0.2)
Total Bilirubin: 0.3 mg/dL (ref <1.2)
Total Protein: 6.9 g/dL (ref 6.5–8.1)

## 2023-09-22 LAB — LIPASE, BLOOD: Lipase: 39 U/L (ref 11–51)

## 2023-09-22 LAB — BASIC METABOLIC PANEL
Anion gap: 10 (ref 5–15)
BUN: 14 mg/dL (ref 8–23)
CO2: 28 mmol/L (ref 22–32)
Calcium: 9.3 mg/dL (ref 8.9–10.3)
Chloride: 106 mmol/L (ref 98–111)
Creatinine, Ser: 1 mg/dL (ref 0.61–1.24)
GFR, Estimated: >60 mL/min (ref >60)
Glucose, Bld: 100 mg/dL — ABNORMAL HIGH (ref 70–99)
Potassium: 3.4 mmol/L — ABNORMAL LOW (ref 3.5–5.1)
Sodium: 144 mmol/L (ref 135–145)

## 2023-09-22 LAB — CBC
HCT: 33.8 % — ABNORMAL LOW (ref 39.0–52.0)
Hemoglobin: 10.2 g/dL — ABNORMAL LOW (ref 13.0–17.0)
MCH: 26.2 pg (ref 26.0–34.0)
MCHC: 30.2 g/dL (ref 30.0–36.0)
MCV: 86.7 fL (ref 80.0–100.0)
Platelets: 224 10*3/uL (ref 150–400)
RBC: 3.9 MIL/uL — ABNORMAL LOW (ref 4.22–5.81)
RDW: 16 % — ABNORMAL HIGH (ref 11.5–15.5)
WBC: 7 10*3/uL (ref 4.0–10.5)
nRBC: 0 % (ref 0.0–0.2)

## 2023-09-22 LAB — GLUCOSE, CAPILLARY
Glucose-Capillary: 105 mg/dL — ABNORMAL HIGH (ref 70–99)
Glucose-Capillary: 118 mg/dL — ABNORMAL HIGH (ref 70–99)
Glucose-Capillary: 104 mg/dL — ABNORMAL HIGH (ref 70–99)
Glucose-Capillary: 125 mg/dL — ABNORMAL HIGH (ref 70–99)
Glucose-Capillary: 130 mg/dL — ABNORMAL HIGH (ref 70–99)

## 2023-09-22 MED ORDER — ALUM & MAG HYDROXIDE-SIMETH 200-200-20 MG/5ML PO SUSP
30.0000 mL | Freq: Once | ORAL | Status: AC
  Administered 2023-09-22: 30 mL via ORAL
  Filled 2023-09-22: qty 30

## 2023-09-22 NOTE — Progress Notes (Signed)
Urology Inpatient Progress Report  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] Septic shock (HCC) [A41.9, R65.21]  Procedure(s): PERINEAL IRRIGATION AND DEBRIDEMENT, WOUND DRESSING CHANGE  8 Days Post-Op   Intv/Subj: No acute events overnight. Patient is without complaint.  Principal Problem:   Septic shock (HCC) Active Problems:   AKI (acute kidney injury) (HCC)   Fournier's gangrene  Current Facility-Administered Medications  Medication Dose Route Frequency Provider Last Rate Last Admin   acetaminophen (TYLENOL) tablet 650 mg  650 mg Oral Q6H PRN Luciano Cutter, MD   650 mg at 09/16/23 2011   Ampicillin-Sulbactam (UNASYN) 3 g in sodium chloride 0.9 % 100 mL IVPB  3 g Intravenous Q6H Luciano Cutter, MD 200 mL/hr at 09/22/23 0646 3 g at 09/22/23 1610   ascorbic acid (VITAMIN C) tablet 500 mg  500 mg Oral BID Osvaldo Shipper, MD   500 mg at 09/21/23 2128   Chlorhexidine Gluconate Cloth 2 % PADS 6 each  6 each Topical Daily Luciano Cutter, MD   6 each at 09/21/23 1253   docusate sodium (COLACE) capsule 100 mg  100 mg Oral BID PRN Winter, Christopher Aaron, MD       docusate sodium (COLACE) capsule 100 mg  100 mg Oral BID Pricilla Riffle, RPH   100 mg at 09/21/23 2128   feeding supplement (ENSURE ENLIVE / ENSURE PLUS) liquid 237 mL  237 mL Oral BID BM Luciano Cutter, MD   237 mL at 09/21/23 1300   fluticasone furoate-vilanterol (BREO ELLIPTA) 200-25 MCG/ACT 1 puff  1 puff Inhalation Daily Luciano Cutter, MD   1 puff at 09/21/23 0605   heparin injection 5,000 Units  5,000 Units Subcutaneous Q8H Rene Paci, MD   5,000 Units at 09/22/23 0555   hydrALAZINE (APRESOLINE) injection 10 mg  10 mg Intravenous Q6H PRN Luciano Cutter, MD       HYDROmorphone (DILAUDID) injection 1 mg  1 mg Intravenous Q3H PRN Paliwal, Aditya, MD   1 mg at 09/21/23 1410   insulin aspart (novoLOG) injection 0-15 Units  0-15 Units Subcutaneous Q4H Rene Paci, MD   2 Units at 09/20/23  2049   ipratropium-albuterol (DUONEB) 0.5-2.5 (3) MG/3ML nebulizer solution 3 mL  3 mL Nebulization Q6H PRN Rene Paci, MD   3 mL at 09/13/23 2150   lip balm (CARMEX) ointment 1 Application  1 Application Topical PRN Luciano Cutter, MD       multivitamin with minerals tablet 1 tablet  1 tablet Oral Daily Osvaldo Shipper, MD   1 tablet at 09/21/23 0839   ondansetron (ZOFRAN) injection 4 mg  4 mg Intravenous Q6H PRN Osvaldo Shipper, MD   4 mg at 09/18/23 0813   Oral care mouth rinse  15 mL Mouth Rinse PRN Luciano Cutter, MD       oxyCODONE (Oxy IR/ROXICODONE) immediate release tablet 10 mg  10 mg Oral Q4H PRN Paliwal, Aditya, MD   10 mg at 09/17/23 0055   Or   oxyCODONE (Oxy IR/ROXICODONE) immediate release tablet 15 mg  15 mg Oral Q4H PRN Paliwal, Aditya, MD   15 mg at 09/22/23 0744   pantoprazole (PROTONIX) EC tablet 40 mg  40 mg Oral BID AC Osvaldo Shipper, MD   40 mg at 09/22/23 0744   polyethylene glycol (MIRALAX / GLYCOLAX) packet 17 g  17 g Oral Daily PRN Rene Paci, MD       simethicone [facility]) chewable tablet 80 mg  80 mg  Oral Q6H PRN Anthoney Harada, NP   80 mg at 09/22/23 0522   sodium chloride flush (NS) 0.9 % injection 10-40 mL  10-40 mL Intracatheter Q12H Luciano Cutter, MD   10 mL at 09/21/23 2137   sodium chloride flush (NS) 0.9 % injection 10-40 mL  10-40 mL Intracatheter PRN Luciano Cutter, MD       umeclidinium bromide (INCRUSE ELLIPTA) 62.5 MCG/ACT 1 puff  1 puff Inhalation Daily Luciano Cutter, MD   1 puff at 09/21/23 0606   zinc sulfate (50mg  elemental zinc) capsule 220 mg  220 mg Oral Daily Osvaldo Shipper, MD   220 mg at 09/21/23 0839     Objective: Vital: Vitals:   09/21/23 1155 09/21/23 2000 09/22/23 0500 09/22/23 0554  BP: (!) 141/77 138/77  (!) 147/84  Pulse: 64 64  91  Resp: 18 19  19   Temp: 98.3 F (36.8 C) 98.2 F (36.8 C)  98.4 F (36.9 C)  TempSrc: Oral Oral  Oral  SpO2: 97% 98%  97%  Weight:   89.3 kg    Height:       I/Os: I/O last 3 completed shifts: In: 1010 [P.O.:600; I.V.:10; IV Piggyback:400] Out: 1475 [Urine:1475]  Physical Exam:  General: Patient is in no apparent distress Lungs: Normal respiratory effort, chest expands symmetrically. GI: The abdomen is soft and nondistended, diffusely tender to deep palpation GU: Scrotal wound appears healthy with fibrinous exudate over left testicle, perineal wound cavity with healthy granulation tissue, repacked with iodoform packing and wet-to-dry; no purulence noted Foley: Draining clear yellow urine Ext: lower extremities symmetric  Lab Results: Recent Labs    09/20/23 0524 09/21/23 0523 09/22/23 0542  WBC 6.5 7.1 7.0  HGB 9.2* 9.2* 10.2*  HCT 31.2* 29.6* 33.8*   Recent Labs    09/20/23 0524 09/21/23 0523 09/22/23 0542  NA 140 142 144  K 3.4* 3.1* 3.4*  CL 108 106 106  CO2 23 28 28   GLUCOSE 99 120* 100*  BUN 17 17 14   CREATININE 0.79 0.92 1.00  CALCIUM 8.7* 8.8* 9.3   No results for input(s): "LABPT", "INR" in the last 72 hours. No results for input(s): "LABURIN" in the last 72 hours. Results for orders placed or performed during the hospital encounter of 09/13/23  Blood culture (routine x 2)     Status: None   Collection Time: 09/13/23  1:21 PM   Specimen: BLOOD  Result Value Ref Range Status   Specimen Description   Final    BLOOD RIGHT ANTECUBITAL Performed at [facility], [address], [place], [place] [address]    Special Requests   Final    BOTTLES DRAWN AEROBIC AND ANAEROBIC Blood Culture adequate volume Performed at [facility], [address], [place], [place] [address]    Culture   Final    NO GROWTH 5 DAYS Performed at [facility], [address] [place], [place] [address]    Report Status 09/18/2023 FINAL  Final  Blood culture (routine x 2)     Status: None   Collection Time: 09/13/23  1:21 PM   Specimen: BLOOD  Result Value Ref Range Status    Specimen Description   Final    BLOOD LEFT ANTECUBITAL Performed at [facility], [address] [place], [place] [address]    Special Requests   Final    BOTTLES DRAWN AEROBIC AND ANAEROBIC Blood Culture adequate volume Performed at [facility], [address], [place], [place] [address]    Culture   Final    NO GROWTH 5 DAYS Performed at [facility], [address] [place], [place] [address]    Report Status 09/18/2023 FINAL  Final  Aerobic/Anaerobic Culture w Gram Stain (surgical/deep wound)     Status: None   Collection Time: 09/13/23  6:53 PM   Specimen: Wound; Abscess  Result Value Ref Range Status   Specimen Description   Final    WOUND Performed at [facility], [address] [place], [place] [address]    Special Requests   Final    PERINEAL ABSCESS Performed at [facility], [address], [place], [place] [address]    Gram Stain   Final    FEW WBC PRESENT, PREDOMINANTLY PMN FEW GRAM POSITIVE COCCI IN PAIRS FEW GRAM NEGATIVE RODS    Culture   Final    RARE ENTEROCOCCUS FAECALIS RARE CUTIBACTERIUM ACNES Standardized susceptibility testing for this organism is not available. MODERATE BACTEROIDES THETAIOTAOMICRON BETA LACTAMASE POSITIVE Performed at [facility], [address] [place], [place] [address]    Report Status 09/18/2023 FINAL  Final   Organism ID, Bacteria ENTEROCOCCUS FAECALIS  Final      Susceptibility   Enterococcus faecalis - MIC*    AMPICILLIN <=2 SENSITIVE Sensitive     VANCOMYCIN 1 SENSITIVE Sensitive     GENTAMICIN SYNERGY RESISTANT Resistant     * RARE ENTEROCOCCUS FAECALIS    Studies/Results: No results found.  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 -Continue dressing changes daily (can  transition to RN wound care) -Pain management with dressing changes seems to be improving.  Still getting dilaudid 1 mg prn. -Continue antibiotics -Plan for OR washout and closure of scrotum on 12/3 -N.p.o. midnight 12/3  2.  Abdominal pain -Patient feels this is consistent with gas/constipation -Encourage ambulation -If no improvement will consider repeat CT the abdomen and pelvis with p.o. and IV contrast   Kasandra Knudsen, MD Urology 09/22/2023, 8:26 AM

## 2023-09-22 NOTE — Progress Notes (Signed)
Mobility Specialist - Progress Note  Pre-mobility: 77 bpm HR, 96% SpO2 During mobility: 90 bpm HR, 88% SpO2 Post-mobility: 73 bpm HR, 93% SPO2   09/22/23 1022  Mobility  Activity Ambulated with assistance in hallway  Level of Assistance Standby assist, set-up cues, supervision of patient - no hands on  Assistive Device Front wheel walker  Distance Ambulated (ft) 150 ft  Range of Motion/Exercises Active Assistive  Activity Response Tolerated well  Mobility Referral Yes  $Mobility charge 1 Mobility  Mobility Specialist Start Time (ACUTE ONLY) 1000  Mobility Specialist Stop Time (ACUTE ONLY) 1022  Mobility Specialist Time Calculation (min) (ACUTE ONLY) 22 min   Pt was found in bed and agreeable to ambulate. No complaints with session and encouraged pursed lip breathing throughout session. At EOS returned to recliner chair with all needs met. Call bell in reach.  Billey Chang Mobility Specialist

## 2023-09-22 NOTE — Progress Notes (Signed)
Pt with new stomach pain, reported 8/10. Patient states he it feels like gas pain. MD notified of new pain. Orders placed.

## 2023-09-22 NOTE — Progress Notes (Signed)
PROGRESS NOTE Evan Brock  UYQ:[id number] DOB: Nov 01, 1950 DOA: 09/13/2023 PCP: Bettey Costa, NP  Brief Narrative/Hospital Course: 72 yr old male with pmh of HTN, CAD (on Brilinta), Type 2 DM (Metformin), COPD, bronchiectasis, high grade prostate cancer with recent Gold seed implant and SpaceOAR gel placement for radiation treatment on 08/14/23, asbestos exposure/chemical exposure by working in [employer] for a total of 80yrs, former smoker tobacco for a total of 8 yrs (half a pack a day), and recently quit smoking marijuana over 2 years ago. Patient originally presented to [facility] ED on 11/16 for testicular/perianal pain.  Workup revealed possibility of Fournier's gangrene.  Urology was consulted.  Patient was admitted to the ICU for septic shock.  Stabilized.  Underwent surgery.  Subsequently transferred to the floor      Subjective: Patient seen and examined  Reports having bowel movement but small no nausea vomiting  Complains of perineal pain but has been having diffuse abdominal pain since this morning  Afebrile past 24 hours Labs mild hypokalemia stable CBC Lfts/lipase added.  Assessment and Plan: Principal Problem:   Septic shock (HCC) Active Problems:   AKI (acute kidney injury) (HCC)   Fournier's gangrene   Fournier's gangrene Septic shock due to above-now resolved. S/P exploration and debridement and packing with [name] 11/21 Second Look completed with minimal debridement and washout, no infectious symptoms remains afebrile WBC stable.  Scrotal wound area with previous PTCA and healing, continue dressing change with pain management as per surgical team following closely. Cont antibiotics as per ID. Defer to urology for further surgical recommendation.Pllan to transition to oral antibiotics close to discharge   Nausea and vomiting: Doing well, passing gas.  X-ray abdomen 11/26 normal gas pattern cont PPI and antiemetics  Abdominal pain: Added Maalox, ?  Etiology if  persistent plan to get CT abdomen today.  Check lipase and LFT.    History of prostate cancer His radiation treatment is currently on hold    Bronchiectasis/COPD/suspected OSA: Advised outpatient sleep apnea evaluation, currently on supplemental oxygen, keep on I-S  AKI: Resolved.   Normocytic anemia Hemoglobin remained stable in 9 g once.  Monitor  Recent Labs    09/18/23 0508 09/19/23 0508 09/20/23 0524 09/21/23 0523 09/22/23 0542  HGB 9.9* 9.4* 9.2* 9.2* 10.2*  MCV 82.2 86.0 88.1 84.1 86.7   Hypokalemia: Replaced    CAD Essential hypertension: No chest pain BP stable.  Will continue holding losartan and home Brilinta due to surgeries (will resume once okay with urology) statin also on hold.  Monitor clinically.     T2DM: PTA on metformin at home.  Currently controlled on sliding scale.  Recent Labs  Lab 09/21/23 1614 09/21/23 1958 09/21/23 2356 09/22/23 0445 09/22/23 0743  GLUCAP 108* 115* 89 105* 118*    Obesity:Patient's Body mass index is 29.08 kg/m. : Will benefit with PCP follow-up, weight loss  healthy lifestyle and outpatient sleep evaluation.   DVT prophylaxis: heparin injection 5,000 Units Start: 09/13/23 2200 Code Status:   Code Status: Full Code Family Communication: plan of care discussed with patient/wife  was at bedside previously, no one at bedside today. Patient status is: Inpatient because of fourneir gangrene Level of care: Telemetry   Dispo: The patient is from: home w/ wife            Anticipated disposition: TBD Objective: Vitals last 24 hrs: Vitals:   09/21/23 2000 09/22/23 0500 09/22/23 0554 09/22/23 0851  BP: 138/77  (!) 147/84 (!) 179/91  Pulse: 64  91 61  Resp: 19  19   Temp: 98.2 F (36.8 C)  98.4 F (36.9 C) 97.8 F (36.6 C)  TempSrc: Oral  Oral Oral  SpO2: 98%  97% 97%  Weight:  89.3 kg    Height:       Weight change: -0.816 kg  Physical Examination: General exam: alert awake, anxious, obese HEENT:Oral mucosa  moist, Ear/Nose WNL grossly Respiratory system: Bilaterally clear BS,no use of accessory muscle Cardiovascular system: S1 & S2 +, No JVD. Gastrointestinal system: Abdomen soft, mild diffuse tenderness, mild bloating, no guarding or rigidity BS+ Nervous System: Alert, awake, moving all extremities,and following commands. Extremities: LE edema neg,distal peripheral pulses palpable and warm.  Skin: No rashes,no icterus.  Posterior scrotal/perineal area not examined-left urology evaluating.  MSK: Normal muscle bulk,tone, power   Medications reviewed:  Scheduled Meds:  ascorbic acid  500 mg Oral BID   Chlorhexidine Gluconate Cloth  6 each Topical Daily   docusate sodium  100 mg Oral BID   feeding supplement  237 mL Oral BID BM   fluticasone furoate-vilanterol  1 puff Inhalation Daily   heparin  5,000 Units Subcutaneous Q8H   insulin aspart  0-15 Units Subcutaneous Q4H   multivitamin with minerals  1 tablet Oral Daily   pantoprazole  40 mg Oral BID AC   sodium chloride flush  10-40 mL Intracatheter Q12H   umeclidinium bromide  1 puff Inhalation Daily   zinc sulfate (50mg  elemental zinc)  220 mg Oral Daily   Continuous Infusions:  ampicillin-sulbactam (UNASYN) IV 3 g (09/22/23 1610)    Diet Order             DIET SOFT Room service appropriate? Yes; Fluid consistency: Thin  Diet effective now                  Nutrition Problem: Increased nutrient needs Etiology:  (Fournier's gangrene) Signs/Symptoms: estimated needs Interventions: Ensure Enlive (each supplement provides 350kcal and 20 grams of protein), MVI   Intake/Output Summary (Last 24 hours) at 09/22/2023 1008 Last data filed at 09/22/2023 0530 Gross per 24 hour  Intake 1010 ml  Output 1125 ml  Net -115 ml   Net IO Since Admission: -1,078.79 mL [09/22/23 1008]  Wt Readings from Last 3 Encounters:  09/22/23 89.3 kg  09/08/23 94.8 kg  08/24/23 94.8 kg     Unresulted Labs (From admission, onward)     Start      Ordered   09/20/23 0500  Basic metabolic panel  Daily,   R     Question:  Specimen collection method  Answer:  Lab=Lab collect   09/19/23 1312   09/20/23 0500  CBC  Daily,   R     Question:  Specimen collection method  Answer:  Lab=Lab collect   09/19/23 1312          Data Reviewed: I have personally reviewed following labs and imaging studies CBC: Recent Labs  Lab 09/18/23 0508 09/19/23 0508 09/20/23 0524 09/21/23 0523 09/22/23 0542  WBC 6.3 6.8 6.5 7.1 7.0  HGB 9.9* 9.4* 9.2* 9.2* 10.2*  HCT 31.0* 31.2* 31.2* 29.6* 33.8*  MCV 82.2 86.0 88.1 84.1 86.7  PLT 166 168 157 177 224   Basic Metabolic Panel:  Recent Labs  Lab 09/15/23 1447 09/15/23 1809 09/16/23 0659 09/16/23 0659 09/16/23 1643 09/17/23 0329 09/18/23 0508 09/19/23 0508 09/20/23 0524 09/21/23 0523 09/22/23 0542  NA  --   --  133*   < >  --  140 143 145 140 142 144  K  --   --  3.7   < >  --  3.3* 3.6 3.5 3.4* 3.1* 3.4*  CL  --   --  107   < >  --  112* 112* 110 108 106 106  CO2  --   --  22   < >  --  22 23 29 23 28 28   GLUCOSE  --   --  159*   < >  --  116* 96 92 99 120* 100*  BUN  --   --  32*   < >  --  27* 22 21 17 17 14   CREATININE  --   --  1.38*   < >  --  1.16 1.05 0.98 0.79 0.92 1.00  CALCIUM  --   --  8.1*   < >  --  8.6* 9.2 8.9 8.7* 8.8* 9.3  MG  --   --  2.0  --   --  1.7  --   --   --   --   --   PHOS 2.5 2.3* 2.3*  --  2.0*  --   --   --   --   --   --    < > = values in this interval not displayed.   GFR: Estimated Creatinine Clearance: 72.7 mL/min (by C-G formula based on SCr of 1 mg/dL). Liver Function Tests:  No results for input(s): "AST", "ALT", "ALKPHOS", "BILITOT", "PROT", "ALBUMIN" in the last 168 hours.  Recent Results (from the past 240 hour(s))  Blood culture (routine x 2)     Status: None   Collection Time: 09/13/23  1:21 PM   Specimen: BLOOD  Result Value Ref Range Status   Specimen Description   Final    BLOOD RIGHT ANTECUBITAL Performed at [facility], [address], [place], [place] [address]    Special Requests   Final    BOTTLES DRAWN AEROBIC AND ANAEROBIC Blood Culture adequate volume Performed at [facility], [address], [place], [place] [address]    Culture   Final    NO GROWTH 5 DAYS Performed at [facility], [address] [place], [place] [address]    Report Status 09/18/2023 FINAL  Final  Blood culture (routine x 2)     Status: None   Collection Time: 09/13/23  1:21 PM   Specimen: BLOOD  Result Value Ref Range Status   Specimen Description   Final    BLOOD LEFT ANTECUBITAL Performed at [facility], [address], [place], [place] [address]    Special Requests   Final    BOTTLES DRAWN AEROBIC AND ANAEROBIC Blood Culture adequate volume Performed at [facility], [address], [place], [place] [address]    Culture   Final    NO GROWTH 5 DAYS Performed at [facility], [address], [place], [place] [address]    Report Status 09/18/2023 FINAL  Final  Aerobic/Anaerobic Culture w Gram Stain (surgical/deep wound)     Status: None   Collection Time: 09/13/23  6:53 PM   Specimen: Wound; Abscess  Result Value Ref Range Status   Specimen Description   Final    WOUND Performed at [facility], [address], [place], [place] [address]    Special Requests   Final    PERINEAL ABSCESS Performed at [facility],  [address], [place], [place] [address]    Gram Stain   Final    FEW WBC PRESENT, PREDOMINANTLY PMN FEW GRAM POSITIVE COCCI IN PAIRS FEW GRAM NEGATIVE RODS    Culture   Final    RARE ENTEROCOCCUS FAECALIS RARE CUTIBACTERIUM ACNES Standardized susceptibility testing for this organism is not available. MODERATE BACTEROIDES THETAIOTAOMICRON BETA LACTAMASE POSITIVE Performed at [facility], [address], [place], [place] [address]    Report Status 09/18/2023 FINAL  Final    Organism ID, Bacteria ENTEROCOCCUS FAECALIS  Final      Susceptibility   Enterococcus faecalis - MIC*    AMPICILLIN <=2 SENSITIVE Sensitive     VANCOMYCIN 1 SENSITIVE Sensitive     GENTAMICIN SYNERGY RESISTANT Resistant     * RARE ENTEROCOCCUS FAECALIS    Antimicrobials: Anti-infectives (From admission, onward)    Start     Dose/Rate Route Frequency Ordered Stop   09/17/23 1600  Ampicillin-Sulbactam (UNASYN) 3 g in sodium chloride 0.9 % 100 mL IVPB        3 g 200 mL/hr over 30 Minutes Intravenous Every 6 hours 09/17/23 1406     09/17/23 0800  ceFEPIme (MAXIPIME) 2 g in sodium chloride 0.9 % 100 mL IVPB  Status:  Discontinued        2 g 200 mL/hr over 30 Minutes Intravenous Every 8 hours 09/17/23 0717 09/17/23 1406   09/16/23 1530  linezolid (ZYVOX) tablet 600 mg  Status:  Discontinued        600 mg Oral Every 12 hours 09/16/23 1441 09/19/23 0713   09/15/23 1500  vancomycin (VANCOCIN) IVPB 1000 mg/200 mL premix  Status:  Discontinued        1,000 mg 200 mL/hr over 60 Minutes Intravenous Every 48 hours 09/13/23 2020 09/14/23 0931   09/15/23 0130  linezolid (ZYVOX) tablet 600 mg  Status:  Discontinued        600 mg Per Tube Every 12 hours 09/15/23 0116 09/16/23 1441   09/14/23 1400  ceFEPIme (MAXIPIME) 2 g in sodium chloride 0.9 % 100 mL IVPB  Status:  Discontinued        2 g 200 mL/hr over 30 Minutes Intravenous Every 24 hours 09/13/23 2020 09/17/23 0717   09/14/23 1330  linezolid (ZYVOX) tablet 600 mg  Status:  Discontinued        600 mg Oral Every 12 hours 09/14/23 0931 09/15/23 0115   09/13/23 2230  clindamycin (CLEOCIN) IVPB 600 mg  Status:  Discontinued        600 mg 100 mL/hr over 30 Minutes Intravenous Every 8 hours 09/13/23 2138 09/14/23 0931   09/13/23 2200  metroNIDAZOLE (FLAGYL) IVPB 500 mg  Status:  Discontinued        500 mg 100 mL/hr over 60 Minutes Intravenous Every 12 hours 09/13/23 2020 09/17/23 1406   09/13/23 1330  vancomycin (VANCOREADY) IVPB 2000 mg/400 mL         2,000 mg 200 mL/hr over 120 Minutes Intravenous  Once 09/13/23 1324 09/13/23 2125   09/13/23 1330  ceFEPIme (MAXIPIME) 2 g in sodium chloride 0.9 % 100 mL IVPB        2 g 200 mL/hr over 30 Minutes Intravenous  Once 09/13/23 1324 09/13/23 1426   09/13/23 1300  clindamycin (CLEOCIN) IVPB 300 mg        300 mg 100 mL/hr over 30 Minutes Intravenous  Once 09/13/23 1259 09/13/23 1352      Culture/Microbiology  Component Value Date/Time   SDES  09/13/2023 1853    WOUND Performed at [facility], [address] [place], [place] [address]    SPECREQUEST  09/13/2023 1853    PERINEAL ABSCESS Performed at [facility], [address], [place], [place] [address]    CULT  09/13/2023 1853    RARE ENTEROCOCCUS FAECALIS RARE CUTIBACTERIUM ACNES Standardized susceptibility testing for this organism is not available. MODERATE BACTEROIDES THETAIOTAOMICRON BETA LACTAMASE POSITIVE Performed at [facility], [address], [place], [place] [address]    REPTSTATUS 09/18/2023 FINAL 09/13/2023 1853    Radiology Studies: No results found.   LOS: 9 days   Total time spent in review of labs and imaging, patient evaluation, formulation of plan, documentation and communication with family: 35 minutes  Lanae Boast, MD [facility]  09/22/2023, 10:08 AM

## 2023-09-23 DIAGNOSIS — R6521 Severe sepsis with septic shock: Secondary | ICD-10-CM | POA: Diagnosis not present

## 2023-09-23 DIAGNOSIS — A419 Sepsis, unspecified organism: Secondary | ICD-10-CM | POA: Diagnosis not present

## 2023-09-23 LAB — GLUCOSE, CAPILLARY
Glucose-Capillary: 105 mg/dL — ABNORMAL HIGH (ref 70–99)
Glucose-Capillary: 119 mg/dL — ABNORMAL HIGH (ref 70–99)
Glucose-Capillary: 85 mg/dL (ref 70–99)
Glucose-Capillary: 90 mg/dL (ref 70–99)
Glucose-Capillary: 98 mg/dL (ref 70–99)
Glucose-Capillary: 99 mg/dL (ref 70–99)

## 2023-09-23 LAB — CBC
HCT: 28.6 % — ABNORMAL LOW (ref 39.0–52.0)
Hemoglobin: 8.8 g/dL — ABNORMAL LOW (ref 13.0–17.0)
MCH: 25.9 pg — ABNORMAL LOW (ref 26.0–34.0)
MCHC: 30.8 g/dL (ref 30.0–36.0)
MCV: 84.1 fL (ref 80.0–100.0)
Platelets: 205 10*3/uL (ref 150–400)
RBC: 3.4 MIL/uL — ABNORMAL LOW (ref 4.22–5.81)
RDW: 15.9 % — ABNORMAL HIGH (ref 11.5–15.5)
WBC: 6.8 10*3/uL (ref 4.0–10.5)
nRBC: 0 % (ref 0.0–0.2)

## 2023-09-23 LAB — BASIC METABOLIC PANEL
Anion gap: 6 (ref 5–15)
BUN: 13 mg/dL (ref 8–23)
CO2: 29 mmol/L (ref 22–32)
Calcium: 8.7 mg/dL — ABNORMAL LOW (ref 8.9–10.3)
Chloride: 107 mmol/L (ref 98–111)
Creatinine, Ser: 0.88 mg/dL (ref 0.61–1.24)
GFR, Estimated: >60 mL/min (ref >60)
Glucose, Bld: 108 mg/dL — ABNORMAL HIGH (ref 70–99)
Potassium: 3 mmol/L — ABNORMAL LOW (ref 3.5–5.1)
Sodium: 142 mmol/L (ref 135–145)

## 2023-09-23 MED ORDER — LOSARTAN POTASSIUM 25 MG PO TABS
25.0000 mg | ORAL_TABLET | Freq: Every day | ORAL | Status: DC
  Administered 2023-09-23 – 2023-09-27 (×5): 25 mg via ORAL
  Filled 2023-09-23 (×5): qty 1

## 2023-09-23 MED ORDER — ASPIRIN 81 MG PO TBEC
81.0000 mg | DELAYED_RELEASE_TABLET | Freq: Every day | ORAL | Status: DC
  Administered 2023-09-23 – 2023-09-27 (×4): 81 mg via ORAL
  Filled 2023-09-23 (×4): qty 1

## 2023-09-23 MED ORDER — INSULIN ASPART 100 UNIT/ML IJ SOLN
0.0000 [IU] | Freq: Three times a day (TID) | INTRAMUSCULAR | Status: DC
  Administered 2023-09-26 (×2): 2 [IU] via SUBCUTANEOUS

## 2023-09-23 MED ORDER — POTASSIUM CHLORIDE CRYS ER 20 MEQ PO TBCR
40.0000 meq | EXTENDED_RELEASE_TABLET | ORAL | Status: AC
  Administered 2023-09-23 (×2): 40 meq via ORAL
  Filled 2023-09-23 (×2): qty 2

## 2023-09-23 NOTE — Progress Notes (Addendum)
PROGRESS NOTE Tayler Lebrecht  RUE:[id number] DOB: 1951/01/27 DOA: 09/13/2023 PCP: Bettey Costa, NP  Brief Narrative/Hospital Course: 72 yr old male with pmh of HTN, CAD (on Brilinta), Type 2 DM (Metformin), COPD, bronchiectasis, high grade prostate cancer with recent Gold seed implant and SpaceOAR gel placement for radiation treatment on 08/14/23, asbestos exposure/chemical exposure by working in [employer] for a total of 41yrs, former smoker tobacco for a total of 8 yrs (half a pack a day), and recently quit smoking marijuana over 2 years ago. Patient originally [facility] ED on 11/16 for testicular/perianal pain.  Workup revealed possibility of Fournier's gangrene.  Urology was consulted.  Patient was admitted to the ICU for septic shock.  Stabilized.  Underwent surgery.  Subsequently transferred to the floor      Subjective: Patient seen and examined this morning He was using bathroom Overnight afebrile BP stable  He had abdominal pain that subsequently resolved  11/30 Labs shows hypokalemia 3.0 hb at 8.8  Assessment and Plan: Principal Problem:   Septic shock (HCC) Active Problems:   AKI (acute kidney injury) (HCC)   Fournier's gangrene   Fournier's gangrene Septic shock due to above-now resolved. S/P exploration and debridement and packing with [name] 11/21 Second Look completed with minimal debridement and washout, no infectious symptoms-doing well with no leukocytosis no fever Continue wound care with pain medication-this is deferred to urology service. On visual examination this morning posterior scrotal wound appears pinkish, packing in place.  Urology planning washout in OR 09/25/23 ID following and  plan is to transition to oral antibiotics close to discharge   Nausea and vomiting: Resolved.  Continue PPI antiemetics as needed Abdominal pain 11/30 am: Given Maalox, lipase LFTs unremarkable,resolved.  History of prostate cancer His radiation treatment is  currently on hold    Bronchiectasis/COPD/suspected OSA: Stable at this time.  Advised outpatient sleep apnea evaluation, currently on supplemental oxygen, keep on I-S  AKI: Resolved.   Normocytic anemia Hemoglobin fluctuating  8-10gm. No sign of active bleeding.  Monitor.  Recent Labs    09/19/23 0508 09/20/23 0524 09/21/23 0523 09/22/23 0542 09/23/23 0501  HGB 9.4* 9.2* 9.2* 10.2* 8.8*  MCV 86.0 88.1 84.1 86.7 84.1   Hypokalemia: Replacing again Recent Labs  Lab 09/16/23 1643 09/17/23 0329 09/18/23 0508 09/19/23 0508 09/20/23 0524 09/21/23 0523 09/22/23 0542 09/23/23 0501  K  --  3.3*   < > 3.5 3.4* 3.1* 3.4* 3.0*  CALCIUM  --  8.6*   < > 8.9 8.7* 8.8* 9.3 8.7*  MG  --  1.7  --   --   --   --   --   --   PHOS 2.0*  --   --   --   --   --   --   --    < > = values in this interval not displayed.    No results found for: "PTH"     CAD Essential hypertension: BP had been stable but trending up slowly.no chest pain.resume losartan at 25 mg ( at home on 100mg ). Home Brilinta on hold due to surgeries ,resume ASA. (will resume once okay with urology) statin also on hold.  Monitor clinically.     T2DM: PTA on metformin at home.  Currently controlled on sliding scale.  Recent Labs  Lab 09/22/23 2005 09/23/23 0003 09/23/23 0402 09/23/23 0831 09/23/23 1140  GLUCAP 130* 90 98 85 119*    Obesity:Patient's Body mass index is 29.72 kg/m. : Will benefit  with PCP follow-up, weight loss  healthy lifestyle and outpatient sleep evaluation.   DVT prophylaxis: heparin injection 5,000 Units Start: 09/13/23 2200 Code Status:   Code Status: Full Code Family Communication: plan of care discussed with patient/wife  was at bedside previously, no one at bedside today. Patient status is: Inpatient because of fourneir gangrene Level of care: Telemetry   Dispo: The patient is from: home w/ wife            Anticipated disposition: TBD Objective: Vitals last 24 hrs: Vitals:    09/23/23 0405 09/23/23 0415 09/23/23 0830 09/23/23 0847  BP: (!) 144/77  (!) 164/84   Pulse: 80  65   Resp:   14   Temp: 97.9 F (36.6 C)  98.2 F (36.8 C)   TempSrc: Oral  Oral   SpO2: 100%  100% 100%  Weight:  91.3 kg    Height:       Weight change: 1.987 kg  Physical Examination: General exam: alert awake, obese, pleasant HEENT:Oral mucosa moist, Ear/Nose WNL grossly Respiratory system: Bilaterally clear BS,no use of accessory muscle Cardiovascular system: S1 & S2 +, No JVD. Gastrointestinal system: Abdomen soft,NT,ND, BS+ Nervous System: Alert, awake, moving all extremities,and following commands. Extremities: LE edema neg,distal peripheral pulses palpable and warm.  Skin: No rashes,no icterus.  Posterior scrotal wound appears pink use packing in place MSK: Normal muscle bulk,tone, power   Medications reviewed:  Scheduled Meds:  ascorbic acid  500 mg Oral BID   aspirin EC  81 mg Oral Daily   Chlorhexidine Gluconate Cloth  6 each Topical Daily   docusate sodium  100 mg Oral BID   feeding supplement  237 mL Oral BID BM   fluticasone furoate-vilanterol  1 puff Inhalation Daily   heparin  5,000 Units Subcutaneous Q8H   insulin aspart  0-15 Units Subcutaneous Q4H   losartan  25 mg Oral Daily   multivitamin with minerals  1 tablet Oral Daily   pantoprazole  40 mg Oral BID AC   potassium chloride  40 mEq Oral Q3H   sodium chloride flush  10-40 mL Intracatheter Q12H   umeclidinium bromide  1 puff Inhalation Daily   zinc sulfate (50mg  elemental zinc)  220 mg Oral Daily   Continuous Infusions:  ampicillin-sulbactam (UNASYN) IV 3 g (09/23/23 0606)    Diet Order             DIET SOFT Room service appropriate? Yes; Fluid consistency: Thin  Diet effective now                  Nutrition Problem: Increased nutrient needs Etiology:  (Fournier's gangrene) Signs/Symptoms: estimated needs Interventions: Ensure Enlive (each supplement provides 350kcal and 20 grams of  protein), MVI   Intake/Output Summary (Last 24 hours) at 09/23/2023 1153 Last data filed at 09/23/2023 0900 Gross per 24 hour  Intake 460 ml  Output 850 ml  Net -390 ml   Net IO Since Admission: -1,348.79 mL [09/23/23 1153]  Wt Readings from Last 3 Encounters:  09/23/23 91.3 kg  09/08/23 94.8 kg  08/24/23 94.8 kg     Unresulted Labs (From admission, onward)     Start     Ordered   09/20/23 0500  Basic metabolic panel  Daily,   R     Question:  Specimen collection method  Answer:  Lab=Lab collect   09/19/23 1312   09/20/23 0500  CBC  Daily,   R     Question:  Specimen  collection method  Answer:  Lab=Lab collect   09/19/23 1312          Data Reviewed: I have personally reviewed following labs and imaging studies CBC: Recent Labs  Lab 09/19/23 0508 09/20/23 0524 09/21/23 0523 09/22/23 0542 09/23/23 0501  WBC 6.8 6.5 7.1 7.0 6.8  HGB 9.4* 9.2* 9.2* 10.2* 8.8*  HCT 31.2* 31.2* 29.6* 33.8* 28.6*  MCV 86.0 88.1 84.1 86.7 84.1  PLT 168 157 177 224 205   Basic Metabolic Panel:  Recent Labs  Lab 09/16/23 1643 09/17/23 0329 09/18/23 0508 09/19/23 0508 09/20/23 0524 09/21/23 0523 09/22/23 0542 09/23/23 0501  NA  --  140   < > 145 140 142 144 142  K  --  3.3*   < > 3.5 3.4* 3.1* 3.4* 3.0*  CL  --  112*   < > 110 108 106 106 107  CO2  --  22   < > 29 23 28 28 29   GLUCOSE  --  116*   < > 92 99 120* 100* 108*  BUN  --  27*   < > 21 17 17 14 13   CREATININE  --  1.16   < > 0.98 0.79 0.92 1.00 0.88  CALCIUM  --  8.6*   < > 8.9 8.7* 8.8* 9.3 8.7*  MG  --  1.7  --   --   --   --   --   --   PHOS 2.0*  --   --   --   --   --   --   --    < > = values in this interval not displayed.   GFR: Estimated Creatinine Clearance: 83.4 mL/min (by C-G formula based on SCr of 0.88 mg/dL). Liver Function Tests:  Recent Labs  Lab 09/22/23 0542  AST 28  ALT 50*  ALKPHOS 56  BILITOT 0.3  PROT 6.9  ALBUMIN 3.1*    Recent Results (from the past 240 hour(s))  Blood culture  (routine x 2)     Status: None   Collection Time: 09/13/23  1:21 PM   Specimen: BLOOD  Result Value Ref Range Status   Specimen Description   Final    BLOOD RIGHT ANTECUBITAL Performed at [facility], [address], [place], [place] [address]    Special Requests   Final    BOTTLES DRAWN AEROBIC AND ANAEROBIC Blood Culture adequate volume Performed at [facility], [address], [place], [place] [address]    Culture   Final    NO GROWTH 5 DAYS Performed at [facility], [address] [place], [place] [address]    Report Status 09/18/2023 FINAL  Final  Blood culture (routine x 2)     Status: None   Collection Time: 09/13/23  1:21 PM   Specimen: BLOOD  Result Value Ref Range Status   Specimen Description   Final    BLOOD LEFT ANTECUBITAL Performed at [facility], [address] [place], [place] [address]    Special Requests   Final    BOTTLES DRAWN AEROBIC AND ANAEROBIC Blood Culture adequate volume Performed at [facility], [address] [place], [place] [address]    Culture   Final    NO GROWTH 5 DAYS Performed at [facility], [address], [place], [place] [address]    Report Status 09/18/2023 FINAL  Final  Aerobic/Anaerobic Culture w Gram Stain (surgical/deep wound)  Status: None   Collection Time: 09/13/23  6:53 PM   Specimen: Wound; Abscess  Result Value Ref Range Status   Specimen Description   Final    WOUND Performed at [facility], [address], [place], [place] [address]    Special Requests   Final    PERINEAL ABSCESS Performed at [facility], [address], [place], [place] [address]    Gram Stain   Final    FEW WBC PRESENT, PREDOMINANTLY PMN FEW GRAM POSITIVE COCCI IN PAIRS FEW GRAM NEGATIVE RODS    Culture   Final    RARE ENTEROCOCCUS FAECALIS RARE CUTIBACTERIUM ACNES Standardized susceptibility testing for  this organism is not available. MODERATE BACTEROIDES THETAIOTAOMICRON BETA LACTAMASE POSITIVE Performed at [facility], [address] [place], [place] [address]    Report Status 09/18/2023 FINAL  Final   Organism ID, Bacteria ENTEROCOCCUS FAECALIS  Final      Susceptibility   Enterococcus faecalis - MIC*    AMPICILLIN <=2 SENSITIVE Sensitive     VANCOMYCIN 1 SENSITIVE Sensitive     GENTAMICIN SYNERGY RESISTANT Resistant     * RARE ENTEROCOCCUS FAECALIS    Antimicrobials: Anti-infectives (From admission, onward)    Start     Dose/Rate Route Frequency Ordered Stop   09/17/23 1600  Ampicillin-Sulbactam (UNASYN) 3 g in sodium chloride 0.9 % 100 mL IVPB        3 g 200 mL/hr over 30 Minutes Intravenous Every 6 hours 09/17/23 1406     09/17/23 0800  ceFEPIme (MAXIPIME) 2 g in sodium chloride 0.9 % 100 mL IVPB  Status:  Discontinued        2 g 200 mL/hr over 30 Minutes Intravenous Every 8 hours 09/17/23 0717 09/17/23 1406   09/16/23 1530  linezolid (ZYVOX) tablet 600 mg  Status:  Discontinued        600 mg Oral Every 12 hours 09/16/23 1441 09/19/23 0713   09/15/23 1500  vancomycin (VANCOCIN) IVPB 1000 mg/200 mL premix  Status:  Discontinued        1,000 mg 200 mL/hr over 60 Minutes Intravenous Every 48 hours 09/13/23 2020 09/14/23 0931   09/15/23 0130  linezolid (ZYVOX) tablet 600 mg  Status:  Discontinued        600 mg Per Tube Every 12 hours 09/15/23 0116 09/16/23 1441   09/14/23 1400  ceFEPIme (MAXIPIME) 2 g in sodium chloride 0.9 % 100 mL IVPB  Status:  Discontinued        2 g 200 mL/hr over 30 Minutes Intravenous Every 24 hours 09/13/23 2020 09/17/23 0717   09/14/23 1330  linezolid (ZYVOX) tablet 600 mg  Status:  Discontinued        600 mg Oral Every 12 hours 09/14/23 0931 09/15/23 0115   09/13/23 2230  clindamycin (CLEOCIN) IVPB 600 mg  Status:  Discontinued        600 mg 100 mL/hr over 30 Minutes Intravenous Every 8 hours 09/13/23 2138 09/14/23 0931   09/13/23 2200   metroNIDAZOLE (FLAGYL) IVPB 500 mg  Status:  Discontinued        500 mg 100 mL/hr over 60 Minutes Intravenous Every 12 hours 09/13/23 2020 09/17/23 1406   09/13/23 1330  vancomycin (VANCOREADY) IVPB 2000 mg/400 mL        2,000 mg 200 mL/hr over 120 Minutes Intravenous  Once 09/13/23 1324 09/13/23 2125   09/13/23 1330  ceFEPIme (MAXIPIME) 2 g in sodium chloride 0.9 %  100 mL IVPB        2 g 200 mL/hr over 30 Minutes Intravenous  Once 09/13/23 1324 09/13/23 1426   09/13/23 1300  clindamycin (CLEOCIN) IVPB 300 mg        300 mg 100 mL/hr over 30 Minutes Intravenous  Once 09/13/23 1259 09/13/23 1352      Culture/Microbiology    Component Value Date/Time   SDES  09/13/2023 1853    WOUND Performed at [facility], [address], [place], [place] [address]    SPECREQUEST  09/13/2023 1853    PERINEAL ABSCESS Performed at [facility], [address], [place], [place] [address]    CULT  09/13/2023 1853    RARE ENTEROCOCCUS FAECALIS RARE CUTIBACTERIUM ACNES Standardized susceptibility testing for this organism is not available. MODERATE BACTEROIDES THETAIOTAOMICRON BETA LACTAMASE POSITIVE Performed at [facility], [address] [place], [place] [address]    REPTSTATUS 09/18/2023 FINAL 09/13/2023 1853    Radiology Studies: No results found.   LOS: 10 days   Total time spent in review of labs and imaging, patient evaluation, formulation of plan, documentation and communication with family: 35 minutes  Lanae Boast, MD [facility]  09/23/2023, 11:53 AM

## 2023-09-23 NOTE — Progress Notes (Signed)
Urology Inpatient Progress Report  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] Septic shock (HCC) [A41.9, R65.21]  Procedure(s): PERINEAL IRRIGATION AND DEBRIDEMENT, WOUND DRESSING CHANGE  9 Days Post-Op   Intv/Subj: No acute events overnight.  Patient ambulating with mobility specialist when seen this morning.  Overall he feels like he is improving.  Abdominal pain from yesterday has resolved.  Principal Problem:   Septic shock (HCC) Active Problems:   AKI (acute kidney injury) (HCC)   Fournier's gangrene  Current Facility-Administered Medications  Medication Dose Route Frequency Provider Last Rate Last Admin   acetaminophen (TYLENOL) tablet 650 mg  650 mg Oral Q6H PRN Luciano Cutter, MD   650 mg at 09/16/23 2011   Ampicillin-Sulbactam (UNASYN) 3 g in sodium chloride 0.9 % 100 mL IVPB  3 g Intravenous Q6H Luciano Cutter, MD 200 mL/hr at 09/23/23 0606 3 g at 09/23/23 0606   ascorbic acid (VITAMIN C) tablet 500 mg  500 mg Oral BID Osvaldo Shipper, MD   500 mg at 09/23/23 6962   aspirin EC tablet 81 mg  81 mg Oral Daily Lanae Boast, MD   81 mg at 09/23/23 0949   Chlorhexidine Gluconate Cloth 2 % PADS 6 each  6 each Topical Daily Luciano Cutter, MD   6 each at 09/23/23 9528   docusate sodium (COLACE) capsule 100 mg  100 mg Oral BID PRN Winter, Christopher Aaron, MD       docusate sodium (COLACE) capsule 100 mg  100 mg Oral BID Pricilla Riffle, RPH   100 mg at 09/23/23 0947   feeding supplement (ENSURE ENLIVE / ENSURE PLUS) liquid 237 mL  237 mL Oral BID BM Luciano Cutter, MD   237 mL at 09/23/23 0951   fluticasone furoate-vilanterol (BREO ELLIPTA) 200-25 MCG/ACT 1 puff  1 puff Inhalation Daily Luciano Cutter, MD   1 puff at 09/23/23 0847   heparin injection 5,000 Units  5,000 Units Subcutaneous Q8H Rene Paci, MD   5,000 Units at 09/23/23 0606   hydrALAZINE (APRESOLINE) injection 10 mg  10 mg Intravenous Q6H PRN Luciano Cutter, MD       HYDROmorphone (DILAUDID)  injection 1 mg  1 mg Intravenous Q3H PRN Paliwal, Eliezer Lofts, MD   1 mg at 09/22/23 0855   insulin aspart (novoLOG) injection 0-15 Units  0-15 Units Subcutaneous Q4H Rene Paci, MD   2 Units at 09/22/23 2043   ipratropium-albuterol (DUONEB) 0.5-2.5 (3) MG/3ML nebulizer solution 3 mL  3 mL Nebulization Q6H PRN Rene Paci, MD   3 mL at 09/13/23 2150   lip balm (CARMEX) ointment 1 Application  1 Application Topical PRN Luciano Cutter, MD       losartan (COZAAR) tablet 25 mg  25 mg Oral Daily Kc, Dayna Barker, MD   25 mg at 09/23/23 0948   multivitamin with minerals tablet 1 tablet  1 tablet Oral Daily Osvaldo Shipper, MD   1 tablet at 09/23/23 0948   ondansetron (ZOFRAN) injection 4 mg  4 mg Intravenous Q6H PRN Osvaldo Shipper, MD   4 mg at 09/18/23 0813   Oral care mouth rinse  15 mL Mouth Rinse PRN Luciano Cutter, MD       oxyCODONE (Oxy IR/ROXICODONE) immediate release tablet 10 mg  10 mg Oral Q4H PRN Conrad Elsmere, MD   10 mg at 09/17/23 0055   Or   oxyCODONE (Oxy IR/ROXICODONE) immediate release tablet 15 mg  15 mg Oral Q4H PRN Conrad St. Martin, MD  15 mg at 09/22/23 0744   pantoprazole (PROTONIX) EC tablet 40 mg  40 mg Oral BID AC Osvaldo Shipper, MD   40 mg at 09/23/23 0949   polyethylene glycol (MIRALAX / GLYCOLAX) packet 17 g  17 g Oral Daily PRN Winter, Christopher Aaron, MD       potassium chloride SA (KLOR-CON M) CR tablet 40 mEq  40 mEq Oral Q3H Kc, Ramesh, MD   40 mEq at 09/23/23 0948   simethicone (MYLICON) chewable tablet 80 mg  80 mg Oral Q6H PRN Anthoney Harada, NP   80 mg at 09/22/23 0522   sodium chloride flush (NS) 0.9 % injection 10-40 mL  10-40 mL Intracatheter Q12H Luciano Cutter, MD   10 mL at 09/21/23 2137   sodium chloride flush (NS) 0.9 % injection 10-40 mL  10-40 mL Intracatheter PRN Luciano Cutter, MD       umeclidinium bromide (INCRUSE ELLIPTA) 62.5 MCG/ACT 1 puff  1 puff Inhalation Daily Luciano Cutter, MD   1 puff at 09/23/23 0847    zinc sulfate (50mg  elemental zinc) capsule 220 mg  220 mg Oral Daily Osvaldo Shipper, MD   220 mg at 09/23/23 0948     Objective: Vital: Vitals:   09/23/23 0405 09/23/23 0415 09/23/23 0830 09/23/23 0847  BP: (!) 144/77  (!) 164/84   Pulse: 80  65   Resp:   14   Temp: 97.9 F (36.6 C)  98.2 F (36.8 C)   TempSrc: Oral  Oral   SpO2: 100%  100% 100%  Weight:  91.3 kg    Height:       I/Os: I/O last 3 completed shifts: In: 950 [P.O.:840; I.V.:10; IV Piggyback:100] Out: 1375 [Urine:1375]  Physical Exam:  General: Patient is in no apparent distress Lungs: Normal respiratory effort, chest expands symmetrically. Foley: draining clear yellow urine  Ext: lower extremities symmetric  Lab Results: Recent Labs    09/21/23 0523 09/22/23 0542 09/23/23 0501  WBC 7.1 7.0 6.8  HGB 9.2* 10.2* 8.8*  HCT 29.6* 33.8* 28.6*   Recent Labs    09/21/23 0523 09/22/23 0542 09/23/23 0501  NA 142 144 142  K 3.1* 3.4* 3.0*  CL 106 106 107  CO2 28 28 29   GLUCOSE 120* 100* 108*  BUN 17 14 13   CREATININE 0.92 1.00 0.88  CALCIUM 8.8* 9.3 8.7*   No results for input(s): "LABPT", "INR" in the last 72 hours. No results for input(s): "LABURIN" in the last 72 hours. Results for orders placed or performed during the hospital encounter of 09/13/23  Blood culture (routine x 2)     Status: None   Collection Time: 09/13/23  1:21 PM   Specimen: BLOOD  Result Value Ref Range Status   Specimen Description   Final    BLOOD RIGHT ANTECUBITAL Performed at [facility], [address] [place], [place] [address]    Special Requests   Final    BOTTLES DRAWN AEROBIC AND ANAEROBIC Blood Culture adequate volume Performed at [facility], [address] [place], [place] [address]    Culture   Final    NO GROWTH 5 DAYS Performed at [facility], [address] [place], [place] [address]    Report Status 09/18/2023 FINAL  Final  Blood culture (routine x 2)      Status: None   Collection Time: 09/13/23  1:21 PM   Specimen: BLOOD  Result Value Ref Range Status  Specimen Description   Final    BLOOD LEFT ANTECUBITAL Performed at [facility], [address] [place], [place] [address]    Special Requests   Final    BOTTLES DRAWN AEROBIC AND ANAEROBIC Blood Culture adequate volume Performed at [facility], [address], [place], [place] [address]    Culture   Final    NO GROWTH 5 DAYS Performed at [facility], [address], [place], [place] [address]    Report Status 09/18/2023 FINAL  Final  Aerobic/Anaerobic Culture w Gram Stain (surgical/deep wound)     Status: None   Collection Time: 09/13/23  6:53 PM   Specimen: Wound; Abscess  Result Value Ref Range Status   Specimen Description   Final    WOUND Performed at [facility], [address] [place], [place] [address]    Special Requests   Final    PERINEAL ABSCESS Performed at [facility], [address], [place], [place] [address]    Gram Stain   Final    FEW WBC PRESENT, PREDOMINANTLY PMN FEW GRAM POSITIVE COCCI IN PAIRS FEW GRAM NEGATIVE RODS    Culture   Final    RARE ENTEROCOCCUS FAECALIS RARE CUTIBACTERIUM ACNES Standardized susceptibility testing for this organism is not available. MODERATE BACTEROIDES THETAIOTAOMICRON BETA LACTAMASE POSITIVE Performed at [facility], [address], [place], [place] [address]    Report Status 09/18/2023 FINAL  Final   Organism ID, Bacteria ENTEROCOCCUS FAECALIS  Final      Susceptibility   Enterococcus faecalis - MIC*    AMPICILLIN <=2 SENSITIVE Sensitive     VANCOMYCIN 1 SENSITIVE Sensitive     GENTAMICIN SYNERGY RESISTANT Resistant     * RARE ENTEROCOCCUS FAECALIS    Studies/Results: No results found.  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 -Continue dressing changes daily (can be done by RN) -Pain management with dressing changes seems to be improving.  Still getting dilaudid 1 mg prn. -Continue antibiotics -Plan for OR washout and closure of scrotum on 12/3 -N.p.o. midnight 12/3     Kasandra Knudsen, MD Urology 09/23/2023, 10:13 AM

## 2023-09-23 NOTE — Progress Notes (Signed)
Mobility Specialist - Progress Note   09/23/23 0947  Mobility  Activity Ambulated with assistance in hallway  Level of Assistance Standby assist, set-up cues, supervision of patient - no hands on  Assistive Device Front wheel walker  Distance Ambulated (ft) 80 ft  Activity Response Tolerated well  Mobility Referral Yes  $Mobility charge 1 Mobility  Mobility Specialist Start Time (ACUTE ONLY) 0932  Mobility Specialist Stop Time (ACUTE ONLY) 0946  Mobility Specialist Time Calculation (min) (ACUTE ONLY) 14 min   Pt received in recliner and agreeable to mobility. No complaints during session. Pt to recliner after session with all needs met. RN in room.   [facility]

## 2023-09-23 NOTE — Plan of Care (Signed)
Discussion of treatment plan with wife and patient. Verbalized understand and asked appropriate questions. Cont with plan of care

## 2023-09-24 ENCOUNTER — Ambulatory Visit: Payer: Medicare HMO

## 2023-09-24 DIAGNOSIS — A419 Sepsis, unspecified organism: Secondary | ICD-10-CM | POA: Diagnosis not present

## 2023-09-24 DIAGNOSIS — R6521 Severe sepsis with septic shock: Secondary | ICD-10-CM | POA: Diagnosis not present

## 2023-09-24 LAB — CBC
HCT: 31.9 % — ABNORMAL LOW (ref 39.0–52.0)
Hemoglobin: 9.5 g/dL — ABNORMAL LOW (ref 13.0–17.0)
MCH: 25.8 pg — ABNORMAL LOW (ref 26.0–34.0)
MCHC: 29.8 g/dL — ABNORMAL LOW (ref 30.0–36.0)
MCV: 86.7 fL (ref 80.0–100.0)
Platelets: 254 10*3/uL (ref 150–400)
RBC: 3.68 MIL/uL — ABNORMAL LOW (ref 4.22–5.81)
RDW: 16.1 % — ABNORMAL HIGH (ref 11.5–15.5)
WBC: 6.9 10*3/uL (ref 4.0–10.5)
nRBC: 0 % (ref 0.0–0.2)

## 2023-09-24 LAB — GLUCOSE, CAPILLARY
Glucose-Capillary: 86 mg/dL (ref 70–99)
Glucose-Capillary: 102 mg/dL — ABNORMAL HIGH (ref 70–99)
Glucose-Capillary: 86 mg/dL (ref 70–99)
Glucose-Capillary: 128 mg/dL — ABNORMAL HIGH (ref 70–99)

## 2023-09-24 LAB — BASIC METABOLIC PANEL
Anion gap: 7 (ref 5–15)
BUN: 15 mg/dL (ref 8–23)
CO2: 28 mmol/L (ref 22–32)
Calcium: 8.9 mg/dL (ref 8.9–10.3)
Chloride: 107 mmol/L (ref 98–111)
Creatinine, Ser: 0.93 mg/dL (ref 0.61–1.24)
GFR, Estimated: >60 mL/min (ref >60)
Glucose, Bld: 101 mg/dL — ABNORMAL HIGH (ref 70–99)
Potassium: 3.5 mmol/L (ref 3.5–5.1)
Sodium: 142 mmol/L (ref 135–145)

## 2023-09-24 NOTE — Progress Notes (Signed)
Mobility Specialist - Progress Note  (RA) Pre-mobility: 70 bpm HR, 95% SpO2 During mobility: 88 bpm HR, 92% SpO2 Post-mobility: 75 bpm HR, 95% SPO2   09/24/23 0950  Mobility  Activity Ambulated with assistance in hallway  Level of Assistance Standby assist, set-up cues, supervision of patient - no hands on  Assistive Device Front wheel walker  Distance Ambulated (ft) 150 ft  Range of Motion/Exercises Active Assistive  Activity Response Tolerated fair  Mobility Referral Yes  $Mobility charge 1 Mobility  Mobility Specialist Start Time (ACUTE ONLY) [id number]  Mobility Specialist Stop Time (ACUTE ONLY) 0950  Mobility Specialist Time Calculation (min) (ACUTE ONLY) 16 min   Pt requesting to ambulate. Pt had x1 standing rest break due to pt holding his breathing and requiring encouragement for pursed lip breathing. Pt SPO2 had dropped to 83% but able to increase >90% within 30 seconds. Able to maintain SPO2 afterwards. Pt had no complaints with session and returned to recliner chair with all needs met. Call bell in reach.  Billey Chang Mobility Specialist

## 2023-09-24 NOTE — Progress Notes (Signed)
[facility]  Date of Admission:  09/13/2023   Total days of inpatient antibiotics 11  Principal Problem:   Septic shock (HCC) Active Problems:   AKI (acute kidney injury) (HCC)   Fournier's gangrene          Assessment: 72 year old male admitted with: #Perineal abscess/14 gangrene status post I&D - Continue wound care - 11/21 OR cultures from perineal abscess growing E faecalis, cutibacterium acnes and Bacteroides thetaiotaomicron - OR plans for 12/3  Recommendations: -Continue to Unasyn, pending OR findings hopefully can switch to Augmentin - Follow OR plan on 12/3.   Microbiology:   Antibiotics: Unasyn 11/25-present Cefepime 11/21 - 11/25 Clindamycin 11/21 Linezolid 11/22 - 11/26 Metronidazole 11/21 - 11/25 Vancomycin 11/21 Cultures: Blood 11/21 no growth  SUBJECTIVE: New complaints. Interval: Afebrile overnight.  Review of Systems: Review of Systems  All other systems reviewed and are negative.    Scheduled Meds:  ascorbic acid  500 mg Oral BID   aspirin EC  81 mg Oral Daily   Chlorhexidine Gluconate Cloth  6 each Topical Daily   docusate sodium  100 mg Oral BID   feeding supplement  237 mL Oral BID BM   fluticasone furoate-vilanterol  1 puff Inhalation Daily   heparin  5,000 Units Subcutaneous Q8H   insulin aspart  0-15 Units Subcutaneous TID WC   losartan  25 mg Oral Daily   multivitamin with minerals  1 tablet Oral Daily   pantoprazole  40 mg Oral BID AC   sodium chloride flush  10-40 mL Intracatheter Q12H   umeclidinium bromide  1 puff Inhalation Daily   zinc sulfate (50mg  elemental zinc)  220 mg Oral Daily   Continuous Infusions:  ampicillin-sulbactam (UNASYN) IV 3 g (09/24/23 0610)   PRN Meds:.acetaminophen, docusate sodium, hydrALAZINE, HYDROmorphone (DILAUDID) injection, ipratropium-albuterol, lip balm, ondansetron (ZOFRAN) IV, mouth rinse, oxyCODONE **OR** oxyCODONE, polyethylene glycol, simethicone,  sodium chloride flush No Known Allergies  OBJECTIVE: Vitals:   09/23/23 1953 09/24/23 0455 09/24/23 0533 09/24/23 0813  BP: (!) 154/80 (!) 152/92    Pulse: 75 69    Resp: 20 20    Temp: 98.6 F (37 C) 98.9 F (37.2 C)    TempSrc: Oral Oral    SpO2: 100% 98%  98%  Weight:   90.4 kg   Height:       Body mass index is 29.42 kg/m.  Physical Exam Constitutional:      General: He is not in acute distress.    Appearance: He is normal weight. He is not toxic-appearing.  HENT:     Head: Normocephalic and atraumatic.     Right Ear: External ear normal.     Left Ear: External ear normal.     Nose: No congestion or rhinorrhea.     Mouth/Throat:     Mouth: Mucous membranes are moist.     Pharynx: Oropharynx is clear.  Eyes:     Extraocular Movements: Extraocular movements intact.     Conjunctiva/sclera: Conjunctivae normal.     Pupils: Pupils are equal, round, and reactive to light.  Cardiovascular:     Rate and Rhythm: Normal rate and regular rhythm.     Heart sounds: No murmur heard.    No friction rub. No gallop.  Pulmonary:     Effort: Pulmonary effort is normal.     Breath sounds: Normal breath sounds.  Abdominal:     General: Abdomen is flat. Bowel sounds are  normal.     Palpations: Abdomen is soft.  Musculoskeletal:        General: No swelling. Normal range of motion.     Cervical back: Normal range of motion and neck supple.  Skin:    General: Skin is warm and dry.  Neurological:     General: No focal deficit present.     Mental Status: He is oriented to person, place, and time.  Psychiatric:        Mood and Affect: Mood normal.       Lab Results Lab Results  Component Value Date   WBC 6.9 09/24/2023   HGB 9.5 (L) 09/24/2023   HCT 31.9 (L) 09/24/2023   MCV 86.7 09/24/2023   PLT 254 09/24/2023    Lab Results  Component Value Date   CREATININE 0.93 09/24/2023   BUN 15 09/24/2023   NA 142 09/24/2023   K 3.5 09/24/2023   CL 107 09/24/2023   CO2 28  09/24/2023    Lab Results  Component Value Date   ALT 50 (H) 09/22/2023   AST 28 09/22/2023   ALKPHOS 56 09/22/2023   BILITOT 0.3 09/22/2023        Danelle Earthly, MD [facility] 09/24/2023, 11:09 AM  I have personally spent 52 minutes involved in face-to-face and non-face-to-face activities for this patient on the day of the visit. Professional time spent includes the following activities: Preparing to see the patient (review of tests), Obtaining and/or reviewing separately obtained history (admission/discharge record), Performing a medically appropriate examination and/or evaluation , Ordering medications/tests/procedures, referring and communicating with other health care professionals, Documenting clinical information in the EMR, Independently interpreting results (not separately reported), Communicating results to the patient/family/caregiver, Counseling and educating the patient/family/caregiver and Care coordination (not separately reported).

## 2023-09-24 NOTE — Progress Notes (Signed)
Physical Therapy Treatment Patient Details Name: Lenis Mohl MRN: [id number] DOB: [birth year] Today's Date: 09/24/2023   History of Present Illness 72 yr old male presented to [facility] ED on 09/08/23 for testicular/perianal pain. Patient was admitted to the ICU for septic shock. Workup revealed possibility of Fournier's gangrene.  s/p Exploration, debridement, and packing  on 09/13/2023.  PMH: HTN, CAD, Type 2 DM, COPD, bronchiectasis, high grade prostate cancer with recent Gold seed implant and SpaceOAR gel placement for radiation treatment on 08/14/23, asbestos exposure/chemical exposure by working in [employer] for a total of 87yrs, former smoker tobacco for a total of 8 yrs (half a pack a day), and recently quit smoking marijuana over 2 years ago.    PT Comments  Pt progressing toward goals. Pt reluctant to amb with out RW support this session. Lightly reliant on UEs. Plan is to  return to OR tomorrow for wound closure. Will continue to follow in acute setting and continue efforts to mobilize.   If plan is discharge home, recommend the following: A little help with walking and/or transfers;A little help with bathing/dressing/bathroom;Assistance with cooking/housework;Help with stairs or ramp for entrance;Direct supervision/assist for medications management;Direct supervision/assist for financial management   Can travel by private vehicle        Equipment Recommendations  Rolling walker (2 wheels)    Recommendations for Other Services       Precautions / Restrictions Precautions Precautions: Fall Restrictions Weight Bearing Restrictions: No     Mobility  Bed Mobility               General bed mobility comments: up in recliner    Transfers Overall transfer level: Needs assistance Equipment used: Rolling walker (2 wheels) Transfers: Sit to/from Stand Sit to Stand: Supervision           General transfer comment: incr time, cues for hand placement     Ambulation/Gait Ambulation/Gait assistance: Supervision Gait Distance (Feet): 140 Feet Assistive device: Rolling walker (2 wheels) Gait Pattern/deviations: Step-through pattern, Decreased stride length Gait velocity: decr     General Gait Details: guarded gait initially, decr cadence, gait steady with RW-no LOB. pt declined to attempt without RW this session   Stairs             Wheelchair Mobility     Tilt Bed    Modified Rankin (Stroke Patients Only)       Balance   Sitting-balance support: No upper extremity supported Sitting balance-Leahy Scale: Good     Standing balance support: No upper extremity supported, During functional activity, Reliant on assistive device for balance Standing balance-Leahy Scale: Fair                              Cognition Arousal: Alert Behavior During Therapy: WFL for tasks assessed/performed Overall Cognitive Status: Within Functional Limits for tasks assessed                                 General Comments: pleasant and cooperative        Exercises Other Exercises Other Exercises: repeated STS x5 minimizing use of UEs    General Comments        Pertinent Vitals/Pain Pain Assessment Pain Assessment: No/denies pain    Home Living  Prior Function            PT Goals (current goals can now be found in the care plan section) Acute Rehab PT Goals Patient Stated Goal: likes to watch tv, hang out with 7 grandkids PT Goal Formulation: With patient/family Time For Goal Achievement: 10/02/23 Potential to Achieve Goals: Good Progress towards PT goals: Progressing toward goals    Frequency    Min 1X/week      PT Plan      Co-evaluation              AM-PAC PT "6 Clicks" Mobility   Outcome Measure  Help needed turning from your back to your side while in a flat bed without using bedrails?: A Little Help needed moving from lying on your  back to sitting on the side of a flat bed without using bedrails?: A Little Help needed moving to and from a bed to a chair (including a wheelchair)?: A Little Help needed standing up from a chair using your arms (e.g., wheelchair or bedside chair)?: A Little Help needed to walk in hospital room?: A Little Help needed climbing 3-5 steps with a railing? : A Little 6 Click Score: 18    End of Session Equipment Utilized During Treatment: Gait belt Activity Tolerance: Patient tolerated treatment well Patient left: in chair;with call bell/phone within reach;with family/visitor present   PT Visit Diagnosis: Difficulty in walking, not elsewhere classified (R26.2)     Time: 1610-9604 PT Time Calculation (min) (ACUTE ONLY): 12 min  Charges:    $Gait Training: 8-22 mins PT General Charges $$ ACUTE PT VISIT: 1 Visit                     Baylyn Sickles, PT  Acute Rehab Dept [facility]) [phone]  09/24/2023    [facility] 09/24/2023, 1:54 PM

## 2023-09-24 NOTE — Plan of Care (Signed)

## 2023-09-24 NOTE — Progress Notes (Signed)
PROGRESS NOTE Evan Brock  WUJ:[id number] DOB: 03/10/1951 DOA: 09/13/2023 PCP: Bettey Costa, NP  Brief Narrative/Hospital Course: 72 yr old male with pmh of HTN, CAD (on Brilinta), Type 2 DM (Metformin), COPD, bronchiectasis, high grade prostate cancer with recent Gold seed implant and SpaceOAR gel placement for radiation treatment on 08/14/23, asbestos exposure/chemical exposure by working in [employer] for a total of 73yrs, former smoker tobacco for a total of 8 yrs (half a pack a day), and recently quit smoking marijuana over 2 years ago. Patient originally presented to [facility] ED on 11/16 for testicular/perianal pain.  Workup revealed possibility of Fournier's gangrene.  Urology was consulted.  Patient was admitted to the ICU for septic shock.  Stabilized.  Underwent surgery.  Subsequently transferred to the floor      Subjective: Patient seen and examined Resting comfortably on the bedside chair Denies any perineal pain or abdominal pain-has been taking pain medication Overnight afebrile BP stable labs unremarkable hemoglobin up   Assessment and Plan: Principal Problem:   Septic shock (HCC) Active Problems:   AKI (acute kidney injury) (HCC)   Fournier's gangrene   Fournier's gangrene Septic shock due to above-now resolved. S/P exploration and debridement and packing with [name] 11/21 Second Look completed with minimal debridement and washout, no infectious symptoms-doing well overall-continue wound care Continue Unasyn pending OR findings hopefully can switch to Augmentin, plan for OR washout on 12/3  Nausea and vomiting Abdominal pain 11/30 am: Continue PPI Maalox .no more nausea vomiting/abdominal pain   History of prostate cancer His radiation treatment is currently on hold    Bronchiectasis/COPD/suspected OSA: Stable at this time.  Advised outpatient sleep apnea evaluation, currently on supplemental oxygen, keep on I-S  AKI: Resolved.   Normocytic anemia Hemoglobin  fluctuating  8-10gm. No sign of active bleeding.  Monitor hemoglobin closely  Recent Labs    09/20/23 0524 09/21/23 0523 09/22/23 0542 09/23/23 0501 09/24/23 0459  HGB 9.2* 9.2* 10.2* 8.8* 9.5*  MCV 88.1 84.1 86.7 84.1 86.7   Hypokalemia: Resolved. Recent Labs  Lab 09/20/23 0524 09/21/23 0523 09/22/23 0542 09/23/23 0501 09/24/23 0459  K 3.4* 3.1* 3.4* 3.0* 3.5  CALCIUM 8.7* 8.8* 9.3 8.7* 8.9    CAD Essential hypertension: BP had been stable but trending up slowly.no chest pain.resume losartan at 25 mg ( at home on 100mg ). Home Brilinta on hold due to surgeries ,resume ASA. (will resume once okay with urology) statin also on hold.  Monitor clinically.     T2DM: PTA on metformin at home.  Currently controlled on sliding scale.  Recent Labs  Lab 09/23/23 1140 09/23/23 1642 09/23/23 2049 09/24/23 0724 09/24/23 1111  GLUCAP 119* 99 105* 86 102*    Obesity:Patient's Body mass index is 29.42 kg/m. : Will benefit with PCP follow-up, weight loss  healthy lifestyle and outpatient sleep evaluation.   DVT prophylaxis: heparin injection 5,000 Units Start: 09/13/23 2200 Code Status:   Code Status: Full Code Family Communication: plan of care discussed with patient/wife  was at bedside previously, no one at bedside today. Patient status is: Inpatient because of fourneir gangrene Level of care: Telemetry   Dispo: The patient is from: home w/ wife            Anticipated disposition: TBD Objective: Vitals last 24 hrs: Vitals:   09/23/23 1953 09/24/23 0455 09/24/23 0533 09/24/23 0813  BP: (!) 154/80 (!) 152/92    Pulse: 75 69    Resp: 20 20    Temp: 98.6 F (  37 C) 98.9 F (37.2 C)    TempSrc: Oral Oral    SpO2: 100% 98%  98%  Weight:   90.4 kg   Height:       Weight change: -0.943 kg  Physical Examination: General exam: alert awake, oriented at baseline, older than stated age HEENT:Oral mucosa moist, Ear/Nose WNL grossly Respiratory system: Bilaterally clear BS,no  use of accessory muscle Cardiovascular system: S1 & S2 +, No JVD. Gastrointestinal system: Abdomen soft,NT,ND, BS+ Nervous System: Alert, awake, moving all extremities,and following commands. Extremities: LE edema neg,distal peripheral pulses palpable and warm.  Skin: No rashes,no icterus.  Post scrotal area not examined today MSK: Normal muscle bulk,tone, power   Medications reviewed:  Scheduled Meds:  ascorbic acid  500 mg Oral BID   aspirin EC  81 mg Oral Daily   Chlorhexidine Gluconate Cloth  6 each Topical Daily   docusate sodium  100 mg Oral BID   feeding supplement  237 mL Oral BID BM   fluticasone furoate-vilanterol  1 puff Inhalation Daily   heparin  5,000 Units Subcutaneous Q8H   insulin aspart  0-15 Units Subcutaneous TID WC   losartan  25 mg Oral Daily   multivitamin with minerals  1 tablet Oral Daily   pantoprazole  40 mg Oral BID AC   sodium chloride flush  10-40 mL Intracatheter Q12H   umeclidinium bromide  1 puff Inhalation Daily   zinc sulfate (50mg  elemental zinc)  220 mg Oral Daily   Continuous Infusions:  ampicillin-sulbactam (UNASYN) IV 3 g (09/24/23 0610)    Diet Order             Diet NPO time specified  Diet effective midnight           DIET SOFT Room service appropriate? Yes; Fluid consistency: Thin  Diet effective now                  Nutrition Problem: Increased nutrient needs Etiology:  (Fournier's gangrene) Signs/Symptoms: estimated needs Interventions: Ensure Enlive (each supplement provides 350kcal and 20 grams of protein), MVI   Intake/Output Summary (Last 24 hours) at 09/24/2023 1139 Last data filed at 09/24/2023 0900 Gross per 24 hour  Intake 240 ml  Output 700 ml  Net -460 ml   Net IO Since Admission: -1,808.79 mL [09/24/23 1139]  Wt Readings from Last 3 Encounters:  09/24/23 90.4 kg  09/08/23 94.8 kg  08/24/23 94.8 kg     Unresulted Labs (From admission, onward)    None     Data Reviewed: I have personally reviewed  following labs and imaging studies CBC: Recent Labs  Lab 09/20/23 0524 09/21/23 0523 09/22/23 0542 09/23/23 0501 09/24/23 0459  WBC 6.5 7.1 7.0 6.8 6.9  HGB 9.2* 9.2* 10.2* 8.8* 9.5*  HCT 31.2* 29.6* 33.8* 28.6* 31.9*  MCV 88.1 84.1 86.7 84.1 86.7  PLT 157 177 224 205 254   Basic Metabolic Panel:  Recent Labs  Lab 09/20/23 0524 09/21/23 0523 09/22/23 0542 09/23/23 0501 09/24/23 0459  NA 140 142 144 142 142  K 3.4* 3.1* 3.4* 3.0* 3.5  CL 108 106 106 107 107  CO2 23 28 28 29 28   GLUCOSE 99 120* 100* 108* 101*  BUN 17 17 14 13 15   CREATININE 0.79 0.92 1.00 0.88 0.93  CALCIUM 8.7* 8.8* 9.3 8.7* 8.9   GFR: Estimated Creatinine Clearance: 78.6 mL/min (by C-G formula based on SCr of 0.93 mg/dL). Liver Function Tests:  Recent Labs  Lab 09/22/23 [phone]  AST 28  ALT 50*  ALKPHOS 56  BILITOT 0.3  PROT 6.9  ALBUMIN 3.1*    No results found for this or any previous visit (from the past 240 hour(s)).   Antimicrobials: Anti-infectives (From admission, onward)    Start     Dose/Rate Route Frequency Ordered Stop   09/17/23 1600  Ampicillin-Sulbactam (UNASYN) 3 g in sodium chloride 0.9 % 100 mL IVPB        3 g 200 mL/hr over 30 Minutes Intravenous Every 6 hours 09/17/23 1406     09/17/23 0800  ceFEPIme (MAXIPIME) 2 g in sodium chloride 0.9 % 100 mL IVPB  Status:  Discontinued        2 g 200 mL/hr over 30 Minutes Intravenous Every 8 hours 09/17/23 0717 09/17/23 1406   09/16/23 1530  linezolid (ZYVOX) tablet 600 mg  Status:  Discontinued        600 mg Oral Every 12 hours 09/16/23 1441 09/19/23 0713   09/15/23 1500  vancomycin (VANCOCIN) IVPB 1000 mg/200 mL premix  Status:  Discontinued        1,000 mg 200 mL/hr over 60 Minutes Intravenous Every 48 hours 09/13/23 2020 09/14/23 0931   09/15/23 0130  linezolid (ZYVOX) tablet 600 mg  Status:  Discontinued        600 mg Per Tube Every 12 hours 09/15/23 0116 09/16/23 1441   09/14/23 1400  ceFEPIme (MAXIPIME) 2 g in sodium chloride  0.9 % 100 mL IVPB  Status:  Discontinued        2 g 200 mL/hr over 30 Minutes Intravenous Every 24 hours 09/13/23 2020 09/17/23 0717   09/14/23 1330  linezolid (ZYVOX) tablet 600 mg  Status:  Discontinued        600 mg Oral Every 12 hours 09/14/23 0931 09/15/23 0115   09/13/23 2230  clindamycin (CLEOCIN) IVPB 600 mg  Status:  Discontinued        600 mg 100 mL/hr over 30 Minutes Intravenous Every 8 hours 09/13/23 2138 09/14/23 0931   09/13/23 2200  metroNIDAZOLE (FLAGYL) IVPB 500 mg  Status:  Discontinued        500 mg 100 mL/hr over 60 Minutes Intravenous Every 12 hours 09/13/23 2020 09/17/23 1406   09/13/23 1330  vancomycin (VANCOREADY) IVPB 2000 mg/400 mL        2,000 mg 200 mL/hr over 120 Minutes Intravenous  Once 09/13/23 1324 09/13/23 2125   09/13/23 1330  ceFEPIme (MAXIPIME) 2 g in sodium chloride 0.9 % 100 mL IVPB        2 g 200 mL/hr over 30 Minutes Intravenous  Once 09/13/23 1324 09/13/23 1426   09/13/23 1300  clindamycin (CLEOCIN) IVPB 300 mg        300 mg 100 mL/hr over 30 Minutes Intravenous  Once 09/13/23 1259 09/13/23 1352      Culture/Microbiology    Component Value Date/Time   SDES  09/13/2023 1853    WOUND Performed at [facility], [address], [place], [place] [address]    SPECREQUEST  09/13/2023 1853    PERINEAL ABSCESS Performed at [facility], [address], [place], [place] [address]    CULT  09/13/2023 1853    RARE ENTEROCOCCUS FAECALIS RARE CUTIBACTERIUM ACNES Standardized susceptibility testing for this organism is not available. MODERATE BACTEROIDES THETAIOTAOMICRON BETA LACTAMASE POSITIVE Performed at [facility], [address], [place], [place] [address]    REPTSTATUS 09/18/2023 FINAL 09/13/2023 1853  Radiology Studies: No results found.   LOS: 11 days   Total time spent in review of labs and imaging, patient evaluation, formulation of plan, documentation and communication with family:  35 minutes  Lanae Boast, MD [facility]  09/24/2023, 11:39 AM

## 2023-09-24 NOTE — Progress Notes (Signed)
   10 Days Post-Op Subjective: Patient looked well on rounds this morning and was participating in physical therapy on my arrival.  He was accompanied by his wife.  Patient reports tolerating wound care much better.  We reviewed the case and plan going forward and for wound closure in the operating room tomorrow morning.  All questions were answered to their satisfaction.  Patient understands to be n.p.o. at midnight.  Objective: Vital signs in last 24 hours: Temp:  [98.2 F (36.8 C)-98.9 F (37.2 C)] 98.2 F (36.8 C) (12/02 1237) Pulse Rate:  [69-76] 76 (12/02 1237) Resp:  [20-22] 22 (12/02 1237) BP: (128-154)/(72-92) 128/72 (12/02 1237) SpO2:  [98 %-100 %] 99 % (12/02 1237) Weight:  [90.4 kg] 90.4 kg (12/02 0533)  Assessment/Plan: # Perineal abscess/Fournier's gangrene S/p I&D. OR cultures positive for E facialis, cutibacterium acnes, and Bacteriodes thetaiotaomicron. Plan to continue Unasyn with switch to Augmentin if OR findings are favorable, per ID. N.p.o. at midnight.  OR washout and closure planned for tomorrow morning Appreciate hospitalist and infectious disease assistance.    Intake/Output from previous day: 12/01 0701 - 12/02 0700 In: 240 [P.O.:240] Out: 700 [Urine:700]  Intake/Output this shift: Total I/O In: 120 [P.O.:120] Out: -   Physical Exam:  General: Alert and oriented CV: No cyanosis Lungs: equal chest rise Abdomen: Soft, NTND, no rebound or guarding Gu: Foley catheter in place draining clear yellow urine.  Lab Results: Recent Labs    09/22/23 0542 09/23/23 0501 09/24/23 0459  HGB 10.2* 8.8* 9.5*  HCT 33.8* 28.6* 31.9*   BMET Recent Labs    09/23/23 0501 09/24/23 0459  NA 142 142  K 3.0* 3.5  CL 107 107  CO2 29 28  GLUCOSE 108* 101*  BUN 13 15  CREATININE 0.88 0.93  CALCIUM 8.7* 8.9     Studies/Results: No results found.    LOS: 11 days   Evan Kirschner, NP [facility] Pager: [phone]  09/24/2023, 1:51 PM

## 2023-09-25 ENCOUNTER — Inpatient Hospital Stay (HOSPITAL_COMMUNITY): Payer: Medicare HMO

## 2023-09-25 ENCOUNTER — Encounter (HOSPITAL_COMMUNITY)
Admission: EM | Disposition: A | Discharge status: Home Health | Payer: Self-pay | Source: Home / Self Care | Attending: Internal Medicine

## 2023-09-25 ENCOUNTER — Ambulatory Visit: Payer: Medicare HMO

## 2023-09-25 DIAGNOSIS — N493 Fournier gangrene: Secondary | ICD-10-CM

## 2023-09-25 DIAGNOSIS — R6521 Severe sepsis with septic shock: Secondary | ICD-10-CM | POA: Diagnosis not present

## 2023-09-25 DIAGNOSIS — A419 Sepsis, unspecified organism: Secondary | ICD-10-CM | POA: Diagnosis not present

## 2023-09-25 HISTORY — PX: SCROTAL EXPLORATION: SHX2386

## 2023-09-25 LAB — GLUCOSE, CAPILLARY
Glucose-Capillary: 97 mg/dL (ref 70–99)
Glucose-Capillary: 90 mg/dL (ref 70–99)
Glucose-Capillary: 92 mg/dL (ref 70–99)
Glucose-Capillary: 92 mg/dL (ref 70–99)
Glucose-Capillary: 124 mg/dL — ABNORMAL HIGH (ref 70–99)

## 2023-09-25 SURGERY — EXPLORATION, SCROTUM
Anesthesia: General

## 2023-09-25 MED ORDER — FENTANYL CITRATE (PF) 100 MCG/2ML IJ SOLN
INTRAMUSCULAR | Status: DC | PRN
  Administered 2023-09-25 (×2): 50 ug via INTRAVENOUS

## 2023-09-25 MED ORDER — ROCURONIUM BROMIDE 100 MG/10ML IV SOLN
INTRAVENOUS | Status: DC | PRN
  Administered 2023-09-25: 60 mg via INTRAVENOUS

## 2023-09-25 MED ORDER — LIDOCAINE HCL (CARDIAC) PF 100 MG/5ML IV SOSY
PREFILLED_SYRINGE | INTRAVENOUS | Status: DC | PRN
  Administered 2023-09-25: 50 mg via INTRAVENOUS

## 2023-09-25 MED ORDER — BUPIVACAINE HCL (PF) 0.25 % IJ SOLN
INTRAMUSCULAR | Status: DC | PRN
  Administered 2023-09-25: 10 mL

## 2023-09-25 MED ORDER — SUGAMMADEX SODIUM 200 MG/2ML IV SOLN
INTRAVENOUS | Status: DC | PRN
  Administered 2023-09-25: 200 mg via INTRAVENOUS

## 2023-09-25 MED ORDER — ROCURONIUM BROMIDE 10 MG/ML (PF) SYRINGE
PREFILLED_SYRINGE | INTRAVENOUS | Status: AC
  Filled 2023-09-25: qty 10

## 2023-09-25 MED ORDER — 0.9 % SODIUM CHLORIDE (POUR BTL) OPTIME
TOPICAL | Status: DC | PRN
  Administered 2023-09-25: 1000 mL

## 2023-09-25 MED ORDER — BUPIVACAINE HCL (PF) 0.25 % IJ SOLN
INTRAMUSCULAR | Status: AC
  Filled 2023-09-25: qty 30

## 2023-09-25 MED ORDER — CHLORHEXIDINE GLUCONATE 0.12 % MT SOLN
15.0000 mL | Freq: Once | OROMUCOSAL | Status: AC
  Administered 2023-09-25: 15 mL via OROMUCOSAL

## 2023-09-25 MED ORDER — LIDOCAINE HCL (PF) 2 % IJ SOLN
INTRAMUSCULAR | Status: AC
  Filled 2023-09-25: qty 5

## 2023-09-25 MED ORDER — ONDANSETRON HCL 4 MG/2ML IJ SOLN
INTRAMUSCULAR | Status: AC
  Filled 2023-09-25: qty 2

## 2023-09-25 MED ORDER — VASOPRESSIN 20 UNIT/ML IV SOLN
INTRAVENOUS | Status: AC
  Filled 2023-09-25: qty 1

## 2023-09-25 MED ORDER — PROPOFOL 10 MG/ML IV BOLUS
INTRAVENOUS | Status: DC | PRN
  Administered 2023-09-25: 150 mg via INTRAVENOUS

## 2023-09-25 MED ORDER — HYDROMORPHONE HCL 1 MG/ML IJ SOLN: 0.2500 mg | INTRAMUSCULAR | Status: DC | PRN

## 2023-09-25 MED ORDER — FENTANYL CITRATE (PF) 100 MCG/2ML IJ SOLN
INTRAMUSCULAR | Status: AC
  Filled 2023-09-25: qty 2

## 2023-09-25 MED ORDER — ONDANSETRON HCL 4 MG/2ML IJ SOLN
INTRAMUSCULAR | Status: DC | PRN
  Administered 2023-09-25: 4 mg via INTRAVENOUS

## 2023-09-25 MED ORDER — LACTATED RINGERS IV SOLN: INTRAVENOUS | Status: DC

## 2023-09-25 SURGICAL SUPPLY — 23 items
BLADE CLIPPER SENSICLIP SURGIC (BLADE) ×1 IMPLANT
BNDG GAUZE DERMACEA FLUFF 4 (GAUZE/BANDAGES/DRESSINGS) ×1 IMPLANT
DERMABOND ADVANCED .7 DNX12 (GAUZE/BANDAGES/DRESSINGS) ×1 IMPLANT
DRAIN PENROSE 0.25X18 (DRAIN) ×1 IMPLANT
DRAPE LAPAROTOMY T 98X78 PEDS (DRAPES) ×1 IMPLANT
ELECT REM PT RETURN 15FT ADLT (MISCELLANEOUS) ×1 IMPLANT
GAUZE SPONGE 4X4 12PLY STRL (GAUZE/BANDAGES/DRESSINGS) IMPLANT
GLOVE BIO SURGEON STRL SZ 6.5 (GLOVE) ×1 IMPLANT
GOWN STRL REUS W/ TWL LRG LVL3 (GOWN DISPOSABLE) ×1 IMPLANT
KIT BASIN OR (CUSTOM PROCEDURE TRAY) ×1 IMPLANT
NDL HYPO 25X1 1.5 SAFETY (NEEDLE) ×1 IMPLANT
NEEDLE HYPO 25X1 1.5 SAFETY (NEEDLE) ×1 IMPLANT
PACK GENERAL/GYN (CUSTOM PROCEDURE TRAY) ×1 IMPLANT
PACKING GAUZE IODOFORM 1INX5YD (GAUZE/BANDAGES/DRESSINGS) IMPLANT
PENCIL SMOKE EVACUATOR (MISCELLANEOUS) IMPLANT
SUPPORTER AHLETIC TETRA LG (SOFTGOODS) IMPLANT
SUT CHROMIC 3 0 SH 27 (SUTURE) ×1 IMPLANT
SUT ETHILON 4 0 PS 2 18 (SUTURE) IMPLANT
SUT MNCRL AB 4-0 PS2 18 (SUTURE) IMPLANT
SUT VIC AB 2-0 SH 27XBRD (SUTURE) IMPLANT
SUT VIC AB 3-0 SH 27X BRD (SUTURE) IMPLANT
SYR CONTROL 10ML LL (SYRINGE) ×1 IMPLANT
TOWEL OR 17X26 10 PK STRL BLUE (TOWEL DISPOSABLE) ×1 IMPLANT

## 2023-09-25 NOTE — Anesthesia Procedure Notes (Signed)
Procedure Name: Intubation Date/Time: 09/25/2023 2:25 PM  Performed by: Lezlie Lye, CRNAPre-anesthesia Checklist: Patient identified, Emergency Drugs available, Suction available and Patient being monitored Patient Re-evaluated:Patient Re-evaluated prior to induction Oxygen Delivery Method: Circle system utilized Preoxygenation: Pre-oxygenation with 100% oxygen Induction Type: IV induction Ventilation: Mask ventilation without difficulty Laryngoscope Size: Miller and 3 Grade View: Grade I Tube type: Oral Tube size: 7.5 mm Number of attempts: 1 Airway Equipment and Method: Stylet Placement Confirmation: ETT inserted through vocal cords under direct vision, positive ETCO2 and breath sounds checked- equal and bilateral Secured at: 23 cm Tube secured with: Tape Dental Injury: Teeth and Oropharynx as per pre-operative assessment

## 2023-09-25 NOTE — Anesthesia Postprocedure Evaluation (Signed)
Anesthesia Post Note  Patient: Shamarcus Ingram  Procedure(s) Performed: PERINEAL WASHOUT AND CLOSURE, SCROTAL EXPLORATION, AND REPACKING OF ABSCESS CAVITY     Patient location during evaluation: PACU Anesthesia Type: General Level of consciousness: awake Pain management: pain level controlled Vital Signs Assessment: post-procedure vital signs reviewed and stable Respiratory status: spontaneous breathing, nonlabored ventilation and respiratory function stable Cardiovascular status: blood pressure returned to baseline and stable Postop Assessment: no apparent nausea or vomiting Anesthetic complications: no   No notable events documented.  Last Vitals:  Vitals:   09/25/23 1614 09/25/23 2020  BP: (!) 151/89 134/85  Pulse: 60 75  Resp: 19 18  Temp: 36.8 C 36.8 C  SpO2: 93% 94%    Last Pain:  Vitals:   09/25/23 2020  TempSrc: Oral  PainSc:                  Linton Rump

## 2023-09-25 NOTE — Transfer of Care (Addendum)
Immediate Anesthesia Transfer of Care Note  Patient: Evan Brock  Procedure(s) Performed: PERINEAL WASHOUT AND CLOSURE, SCROTAL EXPLORATION, AND REPACKING OF ABSCESS CAVITY  Patient Location: PACU  Anesthesia Type:General  Level of Consciousness: awake, alert , and oriented  Airway & Oxygen Therapy: Patient Spontanous Breathing and Patient connected to face mask oxygen  Post-op Assessment: Report given to RN and Post -op Vital signs reviewed and stable  Post vital signs: Reviewed and stable  Last Vitals:  Vitals Value Taken Time  BP 139/76 09/25/23 1517  Temp 36.9 C 09/25/23 1517  Pulse 59 09/25/23 1520  Resp 16 09/25/23 1520  SpO2 100 % 09/25/23 1520  Vitals shown include unfiled device data.  Last Pain:  Vitals:   09/25/23 1517  TempSrc:   PainSc: Asleep      Patients Stated Pain Goal: 2 (09/24/23 2209)  Complications: No notable events documented.

## 2023-09-25 NOTE — Plan of Care (Signed)
  Problem: Education: Goal: Knowledge of General Education information will improve Description: Including pain rating scale, medication(s)/side effects and non-pharmacologic comfort measures Outcome: Progressing   Problem: Clinical Measurements: Goal: Will remain free from infection Outcome: Progressing   Problem: Activity: Goal: Risk for activity intolerance will decrease Outcome: Progressing   Problem: Coping: Goal: Level of anxiety will decrease Outcome: Progressing   Problem: Pain Management: Goal: General experience of comfort will improve Outcome: Progressing

## 2023-09-25 NOTE — Op Note (Signed)
Operative Note  Preoperative diagnosis:  1.  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  Scrotal washout, perineal washout and partial closure of scrotum 2.  Repacking of perineal abscess cavity  Surgeon: Kasandra Knudsen, MD  Assistants:  None  Anesthesia:  General  Complications:  None  EBL: None  Specimens: 1.  None  Drains/Catheters: 1.  Foley catheter  Intraoperative findings:   Granulation tissue present in wound cavity including scrotum and perineum  Indication:  Evan Brock is a 72 y.o. male with history of prostate cancer who is undergoing IMRT and developed perineal pain.  CT of the abdomen pelvis revealed an abscess and he underwent surgical debridement.  He has been admitted on IV antibiotics since and is now going to return for partial closure.  Description of procedure: After risks and benefits of the procedure were discussed, informed consent was obtained.  The patient was taken to the operating room placed in the supine position.  Anesthesia was induced.  He was then repositioned in dorsolithotomy position.  Patient was prepped and draped in the usual sterile fashion and timeout was performed.  The existing packing was removed prior to prepping the patient.  The wound cavity appeared healthy with granulation tissue and no further evidence of infection.  The Bovie was used to undermine the skin and the scrotum.  It was then closed in 2 layers with 3-0 Vicryl followed by 3-0 chromic.  After closure of the scrotal skin there was approximately a 2 inch opening in the perineum.  The perineal abscess cavity was irrigated copiously.  It also appeared to be healing nicely and was repacked with 1 inch iodoform gauze.  No further debridement was necessary.  He emerged from anesthesia and change for the PACU in stable condition.  Plan: Would recommend void trial tomorrow  morning and transition to oral antibiotics.

## 2023-09-25 NOTE — Progress Notes (Signed)
OT Cancellation Note  Patient Details Name: Kaien Vaden MRN: [id number] DOB: [birth year]   Cancelled Treatment:    Reason Eval/Treat Not Completed: Patient at procedure or test/ unavailable OT to continue to follow and check back as schedule will allow.  Rosalio Loud, MS Acute Rehabilitation Department Office# [phone]  09/25/2023, 2:20 PM

## 2023-09-25 NOTE — Anesthesia Preprocedure Evaluation (Addendum)
Anesthesia Evaluation  Patient identified by MRN, date of birth, ID band Patient awake    Reviewed: Allergy & Precautions, NPO status , Patient's Chart, lab work & pertinent test results  History of Anesthesia Complications Negative for: history of anesthetic complications  Airway Mallampati: III  TM Distance: >3 FB Neck ROM: Full   Comment: Previous grade II view with MAC 4, easy mask Dental  (+) Dental Advisory Given, Poor Dentition 3 teeth on the top. Denies loose teeth.:   Pulmonary asthma , pneumonia (7-05/2023), resolved, COPD,  COPD inhaler, former smoker COVID+ 05/2023, treated with nebs/prednisone/antibiotics    Pulmonary exam normal breath sounds clear to auscultation       Cardiovascular hypertension, Pt. on medications and Pt. on home beta blockers (-) angina + CAD, + Past MI (01/2022) and + Cardiac Stents (DES to LCx 01/2022)  + Valvular Problems/Murmurs MR  Rhythm:Regular Rate:Normal  HLD  TTE 09/14/2023: IMPRESSIONS     1. Left ventricular ejection fraction, by estimation, is 60 to 65%. The  left ventricle has normal function. The left ventricle has no regional  wall motion abnormalities. There is mild concentric left ventricular  hypertrophy. Left ventricular diastolic  parameters are indeterminate.   2. Right ventricular systolic function is normal. The right ventricular  size is mildly enlarged. There is severely elevated pulmonary artery  systolic pressure. The estimated right ventricular systolic pressure is  63.7 mmHg.   3. Right atrial size was mildly dilated.   4. The mitral valve was not well visualized. Mild to moderate mitral  valve regurgitation. No evidence of mitral stenosis.   5. Tricuspid valve regurgitation is mild to moderate.   6. The aortic valve is tricuspid. There is mild calcification of the  aortic valve. Aortic valve regurgitation is not visualized. Aortic valve  sclerosis/calcification  is present, without any evidence of aortic  stenosis.   7. Aortic dilatation noted. There is mild dilatation of the ascending  aorta, measuring 41 mm.   8. The inferior vena cava is dilated in size with >50% respiratory  variability, suggesting right atrial pressure of 8 mmHg.   LHC 02/16/2022: LM: Normal LAD: No significant disease RCA: Prox and distal 30% disease Lcx: Large vessel with prox occlusion with organized heavy thrombus (Culprit)   Successful percutaneous coronary intervention prox Lcx     Aspiration thrombectomy     PTCA and stent placement 4.0 X 16 mm Synergy drug-eluting stent     Post dilatation with 4.0X8 mm [place] balloon up to 22 atm   100%--->0% residual stenosis TIMI flow 0-->II     Neuro/Psych    GI/Hepatic   Endo/Other  diabetes, Type 2    Renal/GU ARFRenal disease (resolved)     Musculoskeletal  (+) Arthritis , Osteoarthritis,    Abdominal   Peds  Hematology Lab Results      Component                Value               Date                      WBC                      6.9                 09/24/2023                HGB  9.5 (L)             09/24/2023                HCT                      31.9 (L)            09/24/2023                MCV                      86.7                09/24/2023                PLT                      254                 09/24/2023              Anesthesia Other Findings 72 yr old male with pmh of HTN, CAD (on Brilinta), Type 2 DM (Metformin), COPD, bronchiectasis, high grade prostate cancer with recent Gold seed implant and SpaceOAR gel placement for radiation treatment on 08/14/23, asbestos exposure/chemical exposure by working in [employer] for a total of 68yrs, former smoker tobacco for a total of 8 yrs (half a pack a day), and recently quit smoking marijuana over 2 years ago. Patient originally presented to [facility] ED on 11/16 for testicular/perianal pain.  Workup revealed possibility of Fournier's  gangrene.  Urology was consulted.  Patient was admitted to the ICU for septic shock.  Stabilized.  Underwent surgery.  Subsequently transferred to the floor   Last Brilinta: 09/13/2023    Reproductive/Obstetrics                              Anesthesia Physical Anesthesia Plan  ASA: 3  Anesthesia Plan: General   Post-op Pain Management:    Induction: Intravenous  PONV Risk Score and Plan: 2 and Ondansetron, Dexamethasone and Treatment may vary due to age or medical condition  Airway Management Planned: Oral ETT  Additional Equipment:   Intra-op Plan:   Post-operative Plan: Extubation in OR  Informed Consent: I have reviewed the patients History and Physical, chart, labs and discussed the procedure including the risks, benefits and alternatives for the proposed anesthesia with the patient or authorized representative who has indicated his/her understanding and acceptance.     Dental advisory given  Plan Discussed with: CRNA and Anesthesiologist  Anesthesia Plan Comments: (Risks of general anesthesia discussed including, but not limited to, sore throat, hoarse voice, chipped/damaged teeth, injury to vocal cords, nausea and vomiting, allergic reactions, lung infection, heart attack, stroke, and death. All questions answered. )         Anesthesia Quick Evaluation

## 2023-09-25 NOTE — Progress Notes (Signed)
PROGRESS NOTE Evan Brock  NWG:[id number] DOB: 1951/07/29 DOA: 09/13/2023 PCP: Bettey Costa, NP  Brief Narrative/Hospital Course: 72 yr old male with pmh of HTN, CAD (on Brilinta), Type 2 DM (Metformin), COPD, bronchiectasis, high grade prostate cancer with recent Gold seed implant and SpaceOAR gel placement for radiation treatment on 08/14/23, asbestos exposure/chemical exposure by working in [employer] for a total of 14yrs, former smoker tobacco for a total of 8 yrs (half a pack a day), and recently quit smoking marijuana over 2 years ago. Patient originally presented to [facility] ED on 11/16 for testicular/perianal pain.  Workup revealed possibility of Fournier's gangrene.  Urology was consulted.  Patient was admitted to the ICU for septic shock.  Stabilized.  Underwent surgery.  Subsequently transferred to the floor  and managing with regular dressing changes, IV antibiotics per urology and ID     Subjective: Patient seen and examined this morning Wife at the bedside denies abdominal pain, no discomfort in perineal area Overnight patient is afebrile, NPO for OR today around 3 PM  Assessment and Plan: Principal Problem:   Septic shock (HCC) Active Problems:   AKI (acute kidney injury) (HCC)   Fournier's gangrene   Fournier's gangrene Septic shock due to above-now resolved. S/P exploration and debridement and packing with [name] 11/21 Second Look completed with minimal debridement and washout, no infectious symptoms-doing well overall-continue wound care Continue Unasyn pending OR findings hopefully can switch to Augmentin, plan for OR washout on 12/3  Nausea and vomiting Abdominal pain 11/30 am: Continue PPI Maalox .no more nausea vomiting/abdominal pain   History of prostate cancer His radiation treatment is currently on hold    Bronchiectasis/COPD/suspected OSA: Stable at this time.  Advised outpatient sleep apnea evaluation, currently on supplemental oxygen, keep on  I-S  AKI Hypokalemia: Resolved.  Monitor electrolytes intermittently   Normocytic anemia Hemoglobin fluctuating  8-10gm. No sign of active bleeding.  Monitor hemoglobin closely  Recent Labs    09/20/23 0524 09/21/23 0523 09/22/23 0542 09/23/23 0501 09/24/23 0459  HGB 9.2* 9.2* 10.2* 8.8* 9.5*  MCV 88.1 84.1 86.7 84.1 86.7   CAD Essential hypertension: BP had been stable but trending up slowly.no chest pain.resume losartan at 25 mg ( at home on 100mg ). Home Brilinta on hold due to surgeries  cont ASA. (will resume Brilinta once okay with urology) statin also on hold.  Monitor clinically.     T2DM: PTA on metformin at home.  Well-controlled on SSI. Recent Labs  Lab 09/24/23 0724 09/24/23 1111 09/24/23 1638 09/24/23 2100 09/25/23 0750  GLUCAP 86 102* 86 128* 97    Obesity:Patient's Body mass index is 29.33 kg/m. : Will benefit with PCP follow-up, weight loss  healthy lifestyle and outpatient sleep evaluation.   DVT prophylaxis: heparin injection 5,000 Units Start: 09/13/23 2200 Code Status:   Code Status: Full Code Family Communication: plan of care discussed with patient/wife updated at the bedside 12/3 Patient status is: Inpatient because of fourneir gangrene Level of care: Telemetry   Dispo: The patient is from: home w/ wife            Anticipated disposition: TBD Objective: Vitals last 24 hrs: Vitals:   09/25/23 0316 09/25/23 0500 09/25/23 0818 09/25/23 1020  BP: 135/65   (!) 156/86  Pulse: 63   68  Resp: 16   20  Temp: 98.8 F (37.1 C)     TempSrc: Oral     SpO2: 98%  97%   Weight:  90.1 kg  Height:       Weight change: -0.257 kg  Physical Examination: General exam: alert awake, oriented x3, at baseline, older than stated age HEENT:Oral mucosa moist, Ear/Nose WNL grossly Respiratory system: Bilaterally clear BS,no use of accessory muscle Cardiovascular system: S1 & S2 +, No JVD. Gastrointestinal system: Abdomen soft,NT,ND, BS+ Nervous System:  Alert, awake, moving all extremities,and following commands. Extremities: LE edema neg,distal peripheral pulses palpable and warm.  Skin: No rashes,no icterus.  Perineal area not examined as he is going OR today MSK: Normal muscle bulk,tone, power   Medications reviewed:  Scheduled Meds:  ascorbic acid  500 mg Oral BID   aspirin EC  81 mg Oral Daily   Chlorhexidine Gluconate Cloth  6 each Topical Daily   docusate sodium  100 mg Oral BID   feeding supplement  237 mL Oral BID BM   fluticasone furoate-vilanterol  1 puff Inhalation Daily   heparin  5,000 Units Subcutaneous Q8H   insulin aspart  0-15 Units Subcutaneous TID WC   losartan  25 mg Oral Daily   multivitamin with minerals  1 tablet Oral Daily   pantoprazole  40 mg Oral BID AC   sodium chloride flush  10-40 mL Intracatheter Q12H   umeclidinium bromide  1 puff Inhalation Daily   zinc sulfate (50mg  elemental zinc)  220 mg Oral Daily   Continuous Infusions:  ampicillin-sulbactam (UNASYN) IV 3 g (09/25/23 0606)    Diet Order             Diet NPO time specified  Diet effective midnight                  Nutrition Problem: Increased nutrient needs Etiology:  (Fournier's gangrene) Signs/Symptoms: estimated needs Interventions: Ensure Enlive (each supplement provides 350kcal and 20 grams of protein), MVI   Intake/Output Summary (Last 24 hours) at 09/25/2023 1053 Last data filed at 09/25/2023 0619 Gross per 24 hour  Intake 640 ml  Output 900 ml  Net -260 ml   Net IO Since Admission: -2,068.79 mL [09/25/23 1053]  Wt Readings from Last 3 Encounters:  09/25/23 90.1 kg  09/08/23 94.8 kg  08/24/23 94.8 kg     Unresulted Labs (From admission, onward)    None     Data Reviewed: I have personally reviewed following labs and imaging studies CBC: Recent Labs  Lab 09/20/23 0524 09/21/23 0523 09/22/23 0542 09/23/23 0501 09/24/23 0459  WBC 6.5 7.1 7.0 6.8 6.9  HGB 9.2* 9.2* 10.2* 8.8* 9.5*  HCT 31.2* 29.6* 33.8*  28.6* 31.9*  MCV 88.1 84.1 86.7 84.1 86.7  PLT 157 177 224 205 254   Basic Metabolic Panel:  Recent Labs  Lab 09/20/23 0524 09/21/23 0523 09/22/23 0542 09/23/23 0501 09/24/23 0459  NA 140 142 144 142 142  K 3.4* 3.1* 3.4* 3.0* 3.5  CL 108 106 106 107 107  CO2 23 28 28 29 28   GLUCOSE 99 120* 100* 108* 101*  BUN 17 17 14 13 15   CREATININE 0.79 0.92 1.00 0.88 0.93  CALCIUM 8.7* 8.8* 9.3 8.7* 8.9   GFR: Estimated Creatinine Clearance: 78.5 mL/min (by C-G formula based on SCr of 0.93 mg/dL). Liver Function Tests:  Recent Labs  Lab 09/22/23 0542  AST 28  ALT 50*  ALKPHOS 56  BILITOT 0.3  PROT 6.9  ALBUMIN 3.1*    No results found for this or any previous visit (from the past 240 hour(s)).   Antimicrobials: Anti-infectives (From admission, onward)    Start  Dose/Rate Route Frequency Ordered Stop   09/17/23 1600  Ampicillin-Sulbactam (UNASYN) 3 g in sodium chloride 0.9 % 100 mL IVPB        3 g 200 mL/hr over 30 Minutes Intravenous Every 6 hours 09/17/23 1406     09/17/23 0800  ceFEPIme (MAXIPIME) 2 g in sodium chloride 0.9 % 100 mL IVPB  Status:  Discontinued        2 g 200 mL/hr over 30 Minutes Intravenous Every 8 hours 09/17/23 0717 09/17/23 1406   09/16/23 1530  linezolid (ZYVOX) tablet 600 mg  Status:  Discontinued        600 mg Oral Every 12 hours 09/16/23 1441 09/19/23 0713   09/15/23 1500  vancomycin (VANCOCIN) IVPB 1000 mg/200 mL premix  Status:  Discontinued        1,000 mg 200 mL/hr over 60 Minutes Intravenous Every 48 hours 09/13/23 2020 09/14/23 0931   09/15/23 0130  linezolid (ZYVOX) tablet 600 mg  Status:  Discontinued        600 mg Per Tube Every 12 hours 09/15/23 0116 09/16/23 1441   09/14/23 1400  ceFEPIme (MAXIPIME) 2 g in sodium chloride 0.9 % 100 mL IVPB  Status:  Discontinued        2 g 200 mL/hr over 30 Minutes Intravenous Every 24 hours 09/13/23 2020 09/17/23 0717   09/14/23 1330  linezolid (ZYVOX) tablet 600 mg  Status:  Discontinued         600 mg Oral Every 12 hours 09/14/23 0931 09/15/23 0115   09/13/23 2230  clindamycin (CLEOCIN) IVPB 600 mg  Status:  Discontinued        600 mg 100 mL/hr over 30 Minutes Intravenous Every 8 hours 09/13/23 2138 09/14/23 0931   09/13/23 2200  metroNIDAZOLE (FLAGYL) IVPB 500 mg  Status:  Discontinued        500 mg 100 mL/hr over 60 Minutes Intravenous Every 12 hours 09/13/23 2020 09/17/23 1406   09/13/23 1330  vancomycin (VANCOREADY) IVPB 2000 mg/400 mL        2,000 mg 200 mL/hr over 120 Minutes Intravenous  Once 09/13/23 1324 09/13/23 2125   09/13/23 1330  ceFEPIme (MAXIPIME) 2 g in sodium chloride 0.9 % 100 mL IVPB        2 g 200 mL/hr over 30 Minutes Intravenous  Once 09/13/23 1324 09/13/23 1426   09/13/23 1300  clindamycin (CLEOCIN) IVPB 300 mg        300 mg 100 mL/hr over 30 Minutes Intravenous  Once 09/13/23 1259 09/13/23 1352      Culture/Microbiology    Component Value Date/Time   SDES  09/13/2023 1853    WOUND Performed at [facility], [address], [place], [place] [address]    SPECREQUEST  09/13/2023 1853    PERINEAL ABSCESS Performed at [facility], [address], [place], [place] [address]    CULT  09/13/2023 1853    RARE ENTEROCOCCUS FAECALIS RARE CUTIBACTERIUM ACNES Standardized susceptibility testing for this organism is not available. MODERATE BACTEROIDES THETAIOTAOMICRON BETA LACTAMASE POSITIVE Performed at [facility], [address] [place], [place] [address]    REPTSTATUS 09/18/2023 FINAL 09/13/2023 1853    Radiology Studies: No results found.   LOS: 12 days   Total time spent in review of labs and imaging, patient evaluation, formulation of plan, documentation and communication with family: 35 minutes  Lanae Boast, MD [facility]  09/25/2023, 10:53 AM

## 2023-09-25 NOTE — Interval H&P Note (Signed)
History and Physical Interval Note:  09/25/2023 2:06 PM  Evan Brock  has presented today for surgery, with the diagnosis of PERINEAL FOURNIERS GANGRENE.  The various methods of treatment have been discussed with the patient and family. After consideration of risks, benefits and other options for treatment, the patient has consented to  Procedure(s): PERINEAL WASHOUT AND CLOSURE (N/A) as a surgical intervention.  The patient's history has been reviewed, patient examined, no change in status, stable for surgery.  I have reviewed the patient's chart and labs.  Questions were answered to the patient's satisfaction.     Karam Dunson D Eswin Worrell

## 2023-09-26 ENCOUNTER — Encounter (HOSPITAL_COMMUNITY): Payer: Self-pay | Admitting: Urology

## 2023-09-26 ENCOUNTER — Ambulatory Visit: Payer: Medicare HMO

## 2023-09-26 DIAGNOSIS — L02215 Cutaneous abscess of perineum: Secondary | ICD-10-CM

## 2023-09-26 DIAGNOSIS — R6521 Severe sepsis with septic shock: Secondary | ICD-10-CM | POA: Diagnosis not present

## 2023-09-26 DIAGNOSIS — A419 Sepsis, unspecified organism: Secondary | ICD-10-CM | POA: Diagnosis not present

## 2023-09-26 LAB — BASIC METABOLIC PANEL
Anion gap: 10 (ref 5–15)
BUN: 11 mg/dL (ref 8–23)
CO2: 25 mmol/L (ref 22–32)
Calcium: 9.2 mg/dL (ref 8.9–10.3)
Chloride: 106 mmol/L (ref 98–111)
Creatinine, Ser: 0.99 mg/dL (ref 0.61–1.24)
GFR, Estimated: >60 mL/min (ref >60)
Glucose, Bld: 97 mg/dL (ref 70–99)
Potassium: 4 mmol/L (ref 3.5–5.1)
Sodium: 141 mmol/L (ref 135–145)

## 2023-09-26 LAB — CBC
HCT: 34.6 % — ABNORMAL LOW (ref 39.0–52.0)
Hemoglobin: 10.1 g/dL — ABNORMAL LOW (ref 13.0–17.0)
MCH: 26 pg (ref 26.0–34.0)
MCHC: 29.2 g/dL — ABNORMAL LOW (ref 30.0–36.0)
MCV: 89.2 fL (ref 80.0–100.0)
Platelets: 312 10*3/uL (ref 150–400)
RBC: 3.88 MIL/uL — ABNORMAL LOW (ref 4.22–5.81)
RDW: 17.2 % — ABNORMAL HIGH (ref 11.5–15.5)
WBC: 7.9 10*3/uL (ref 4.0–10.5)
nRBC: 0 % (ref 0.0–0.2)

## 2023-09-26 LAB — GLUCOSE, CAPILLARY
Glucose-Capillary: 107 mg/dL — ABNORMAL HIGH (ref 70–99)
Glucose-Capillary: 124 mg/dL — ABNORMAL HIGH (ref 70–99)
Glucose-Capillary: 98 mg/dL (ref 70–99)
Glucose-Capillary: 97 mg/dL (ref 70–99)

## 2023-09-26 MED ORDER — AMOXICILLIN-POT CLAVULANATE 875-125 MG PO TABS
1.0000 | ORAL_TABLET | Freq: Two times a day (BID) | ORAL | Status: DC
  Administered 2023-09-26 – 2023-09-27 (×3): 1 via ORAL
  Filled 2023-09-26 (×3): qty 1

## 2023-09-26 NOTE — TOC Progression Note (Addendum)
Transition of Care [facility]) - Progression Note    Patient Details  Name: Evan Brock MRN: [id number] Date of Birth: [birth year]  Transition of [facility] CM/SW Contact  Howell Rucks, RN Phone Number: 09/26/2023, 12:55 PM  Clinical Narrative:  Teams chat received from attending that pt will need wound care at discharge for daily wound changes. TOC consult received for SNF for daily wound care. NCM discussed with bedside nurse, reports she will initiate wound care teaching with pt's spouse today. Team notified, per attending, wound care teaching to spouse today, plan for dc tomorrow.   -12:57pm NCM outreached to [employer], rep- Mitch, RW to be delivered to room today.     Expected Discharge Plan: Home/Self Care Barriers to Discharge: Continued Medical Work up  Expected Discharge Plan and Services   Discharge Planning Services: CM Consult   Living arrangements for the past 2 months: Single Family Home                                       Social Determinants of Health (SDOH) Interventions SDOH Screenings   Food Insecurity: No Food Insecurity (09/14/2023)  Housing: Low Risk  (09/14/2023)  Transportation Needs: No Transportation Needs (09/14/2023)  Utilities: Not At Risk (09/14/2023)  Alcohol Screen: Low Risk  (03/06/2023)  Depression (PHQ2-9): Low Risk  (03/06/2023)  Tobacco Use: Medium Risk (09/14/2023)    Readmission Risk Interventions    09/14/2023    9:35 AM  Readmission Risk Prevention Plan  Transportation Screening Complete  PCP or Specialist Appt within 3-5 Days Complete  HRI or Home Care Consult Complete  Social Work Consult for Recovery Care Planning/Counseling Complete  Palliative Care Screening Not Applicable  Medication Review [employer]) Complete

## 2023-09-26 NOTE — Progress Notes (Signed)
[facility]  Date of Admission:  09/13/2023   Total days of inpatient antibiotics 11  Principal Problem:   Septic shock (HCC) Active Problems:   AKI (acute kidney injury) (HCC)   Fournier's gangrene          Assessment: 72 year old male admitted with: #Perineal abscess/14 gangrene status post I&D - Continue wound care - 11/21 OR cultures from perineal abscess growing E faecalis, cutibacterium acnes and Bacteroides thetaiotaomicron - Taken to the OR on 12/3 with urology for scrotal washout and repacking.  Per OR note the wound cavity appeared healthy and granular tissue tissue without evidence of infection.  Recommendations: - DC Unasyn - Start Augmentin 875/125 mg p.o. twice daily, anticipate 3 weeks antibiotics from OR EOT 12/23 - Follow-up with infectious disease outpatient with Dr. Drue Second on 12/16 - ID will sign off   Microbiology:   Antibiotics: Unasyn 11/25-present Cefepime 11/21 - 11/25 Clindamycin 11/21 Linezolid 11/22 - 11/26 Metronidazole 11/21 - 11/25 Vancomycin 11/21 Cultures: Blood 11/21 no growth  SUBJECTIVE: Sitting in chair.  Reports he feels well.  Foley out Interval: Afebrile overnight.  Review of Systems: Review of Systems  All other systems reviewed and are negative.    Scheduled Meds:  amoxicillin-clavulanate  1 tablet Oral Q12H   ascorbic acid  500 mg Oral BID   aspirin EC  81 mg Oral Daily   Chlorhexidine Gluconate Cloth  6 each Topical Daily   docusate sodium  100 mg Oral BID   feeding supplement  237 mL Oral BID BM   fluticasone furoate-vilanterol  1 puff Inhalation Daily   heparin  5,000 Units Subcutaneous Q8H   insulin aspart  0-15 Units Subcutaneous TID WC   losartan  25 mg Oral Daily   multivitamin with minerals  1 tablet Oral Daily   pantoprazole  40 mg Oral BID AC   sodium chloride flush  10-40 mL Intracatheter Q12H   umeclidinium bromide  1 puff Inhalation Daily   zinc sulfate (50mg   elemental zinc)  220 mg Oral Daily   Continuous Infusions:   PRN Meds:.acetaminophen, docusate sodium, hydrALAZINE, HYDROmorphone (DILAUDID) injection, ipratropium-albuterol, lip balm, ondansetron (ZOFRAN) IV, mouth rinse, oxyCODONE **OR** oxyCODONE, polyethylene glycol, simethicone, sodium chloride flush No Known Allergies  OBJECTIVE: Vitals:   09/25/23 1614 09/25/23 2020 09/26/23 0506 09/26/23 0814  BP: (!) 151/89 134/85 (!) 125/114   Pulse: 60 75 82   Resp: 19 18    Temp: 98.3 F (36.8 C) 98.2 F (36.8 C) 98.5 F (36.9 C)   TempSrc: Axillary Oral Oral   SpO2: 93% 94% 96% 92%  Weight:   98.1 kg   Height:       Body mass index is 31.94 kg/m.  Physical Exam Constitutional:      General: He is not in acute distress.    Appearance: He is normal weight. He is not toxic-appearing.  HENT:     Head: Normocephalic and atraumatic.     Right Ear: External ear normal.     Left Ear: External ear normal.     Nose: No congestion or rhinorrhea.     Mouth/Throat:     Mouth: Mucous membranes are moist.     Pharynx: Oropharynx is clear.  Eyes:     Extraocular Movements: Extraocular movements intact.     Conjunctiva/sclera: Conjunctivae normal.     Pupils: Pupils are equal, round, and reactive to light.  Cardiovascular:     Rate and  Rhythm: Normal rate and regular rhythm.     Heart sounds: No murmur heard.    No friction rub. No gallop.  Pulmonary:     Effort: Pulmonary effort is normal.     Breath sounds: Normal breath sounds.  Abdominal:     General: Abdomen is flat. Bowel sounds are normal.     Palpations: Abdomen is soft.  Musculoskeletal:        General: No swelling. Normal range of motion.     Cervical back: Normal range of motion and neck supple.  Skin:    General: Skin is warm and dry.  Neurological:     General: No focal deficit present.     Mental Status: He is oriented to person, place, and time.  Psychiatric:        Mood and Affect: Mood normal.       Lab  Results Lab Results  Component Value Date   WBC 7.9 09/26/2023   HGB 10.1 (L) 09/26/2023   HCT 34.6 (L) 09/26/2023   MCV 89.2 09/26/2023   PLT 312 09/26/2023    Lab Results  Component Value Date   CREATININE 0.99 09/26/2023   BUN 11 09/26/2023   NA 141 09/26/2023   K 4.0 09/26/2023   CL 106 09/26/2023   CO2 25 09/26/2023    Lab Results  Component Value Date   ALT 50 (H) 09/22/2023   AST 28 09/22/2023   ALKPHOS 56 09/22/2023   BILITOT 0.3 09/22/2023        Danelle Earthly, MD [facility] 09/26/2023, 11:30 AM  I have personally spent 51 minutes involved in face-to-face and non-face-to-face activities for this patient on the day of the visit. Professional time spent includes the following activities: Preparing to see the patient (review of tests), Obtaining and/or reviewing separately obtained history (admission/discharge record), Performing a medically appropriate examination and/or evaluation , Ordering medications/tests/procedures, referring and communicating with other health care professionals, Documenting clinical information in the EMR, Independently interpreting results (not separately reported), Communicating results to the patient/family/caregiver, Counseling and educating the patient/family/caregiver and Care coordination (not separately reported).

## 2023-09-26 NOTE — Progress Notes (Signed)
Physical Therapy Treatment Patient Details Name: Evan Brock MRN: [id number] DOB: Oct 23, 1951 Today's Date: 09/26/2023   History of Present Illness 72 yr old male presented to [facility] ED on 09/08/23 for testicular/perianal pain. Patient was admitted to the ICU for septic shock. Workup revealed possibility of Fournier's gangrene.  s/p Exploration, debridement, and packing  on 09/13/2023.  PMH: HTN, CAD, Type 2 DM, COPD, bronchiectasis, high grade prostate cancer with recent Gold seed implant and SpaceOAR gel placement for radiation treatment on 08/14/23, asbestos exposure/chemical exposure by working in [employer] for a total of 29yrs, former smoker tobacco for a total of 8 yrs (half a pack a day), and recently quit smoking marijuana over 2 years ago.    PT Comments  Pt AxO x 3 pleasant and motivated.  Spouse in room.  Assisted with amb in hallway went well.  General Gait Details: started amb with walker then progressed without.  Balance is fair and steps are alternating.  No overt LOB.  Rec a walker for community use.  Pt has Hx COPD with avg RA 88-92%.  Instructed on Purse Lip breathing. Pt plans to return home.  LPT has rec HH PT and pt will need a walker.     If plan is discharge home, recommend the following: A little help with walking and/or transfers;A little help with bathing/dressing/bathroom;Assistance with cooking/housework;Help with stairs or ramp for entrance;Direct supervision/assist for medications management;Direct supervision/assist for financial management   Can travel by private vehicle        Equipment Recommendations  Rolling walker (2 wheels)    Recommendations for Other Services       Precautions / Restrictions Precautions Precautions: Fall Precaution Comments: Hx COPD Restrictions Weight Bearing Restrictions: No     Mobility  Bed Mobility               General bed mobility comments: OOB in recliner    Transfers Overall transfer level: Needs  assistance Equipment used: Rolling walker (2 wheels) Transfers: Sit to/from Stand             General transfer comment: good use of hands to power up    Ambulation/Gait Ambulation/Gait assistance: Supervision Gait Distance (Feet): 155 Feet Assistive device: Rolling walker (2 wheels), None Gait Pattern/deviations: Step-through pattern, Decreased stride length Gait velocity: decr     General Gait Details: started amb with walker then progressed without.  Balance is fair and steps are alternating.  No overt LOB.  Rec a walker for community use.  Pt has Hx COPD with avg RA 88-92%.  Instructed on Purse Lip breathing.   Stairs             Wheelchair Mobility     Tilt Bed    Modified Rankin (Stroke Patients Only)       Balance                                            Cognition Arousal: Alert Behavior During Therapy: WFL for tasks assessed/performed Overall Cognitive Status: Within Functional Limits for tasks assessed                                 General Comments: pleasant and cooperative        Exercises      General Comments  Pertinent Vitals/Pain Pain Assessment Pain Assessment: Faces Faces Pain Scale: Hurts a little bit Pain Location: groin Pain Descriptors / Indicators: Discomfort, Grimacing, Guarding    Home Living                          Prior Function            PT Goals (current goals can now be found in the care plan section) Progress towards PT goals: Progressing toward goals    Frequency    Min 1X/week      PT Plan      Co-evaluation              AM-PAC PT "6 Clicks" Mobility   Outcome Measure  Help needed turning from your back to your side while in a flat bed without using bedrails?: None Help needed moving from lying on your back to sitting on the side of a flat bed without using bedrails?: None Help needed moving to and from a bed to a chair (including a  wheelchair)?: None Help needed standing up from a chair using your arms (e.g., wheelchair or bedside chair)?: None Help needed to walk in hospital room?: A Little Help needed climbing 3-5 steps with a railing? : A Little 6 Click Score: 22    End of Session Equipment Utilized During Treatment: Gait belt Activity Tolerance: Patient tolerated treatment well Patient left: in chair;with call bell/phone within reach;with family/visitor present Nurse Communication: Mobility status PT Visit Diagnosis: Difficulty in walking, not elsewhere classified (R26.2)     Time: 1056-1110 PT Time Calculation (min) (ACUTE ONLY): 14 min  Charges:    $Gait Training: 8-22 mins PT General Charges $$ ACUTE PT VISIT: 1 Visit                     Felecia Shelling  PTA Acute  Rehabilitation Services Office M-F          [phone]

## 2023-09-26 NOTE — Progress Notes (Signed)
PROGRESS NOTE Evan Brock  UVO:[id number] DOB: 09/04/1951 DOA: 09/13/2023 PCP: Bettey Costa, NP  Brief Narrative/Hospital Course: 72 yr old male with pmh of HTN, CAD (on Brilinta), Type 2 DM (Metformin), COPD, bronchiectasis, high grade prostate cancer with recent Gold seed implant and SpaceOAR gel placement for radiation treatment on 08/14/23, asbestos exposure/chemical exposure by working in [employer] for a total of 8yrs, former smoker tobacco for a total of 8 yrs (half a pack a day), and recently quit smoking marijuana over 2 years ago. Patient originally presented to [facility] ED on 11/16 for testicular/perianal pain.  Workup revealed possibility of Fournier's gangrene.  Urology was consulted.  Patient was admitted to the ICU for septic shock.  Stabilized.  Underwent surgery.  Subsequently transferred to the floor  and managing with regular dressing changes, IV antibiotics per urology and ID.  Overall patient is clinically improved, patient went for scrotal washout perineal washout and partial closure of scrotum on 12/3 and repacking of perineal abscess cavity.  Urology advised to do voiding trial and transition to oral antibiotics on 09/26/23.  Urology did dressing changes- small perineal tract around 5cm deep. Packed with iodiform, kerlix fluffs. High level of anxiety surrounding dressing changes. Continued to jump and hyperventilate prior to being touched.  Once wound care established okay for discharge home for urology.Patient will continue Augmentin x 3 wk EOT 12/16.     Subjective: Seen and examined, Overnight vitals/labs/events reviewed  Doing well- had high level of anxiety for dressing changes.  Assessment and Plan: Principal Problem:   Septic shock (HCC) Active Problems:   AKI (acute kidney injury) (HCC)   Fournier's gangrene   Fournier's gangrene Septic shock due to above-now resolved. S/P exploration and debridement and packing with [name] 11/21 Second Look completed with  minimal debridement and washout, no infectious symptoms-doing well overal s/p washout on 12/3 Urology did dressing changes- small perineal tract around 5cm deep noted- packed with iodiform, kerlix fluffs.-Noticed to have high level of anxiety surrounding dressing changes -was jumping and hyperventilating prior to being touched.  Wound care will be educated to his wife and home health RN will be arranged and anticipated discharge tomorrow discussed with urology who will put in [facility] instruction  Nausea and vomiting Abdominal pain 11/30 am: Nursing, continue PPI Maalox  History of prostate cancer His radiation treatment is currently on hold    Bronchiectasis/COPD/suspected OSA: Stable at this time.  Advised outpatient sleep apnea evaluation, currently on supplemental oxygen, keep on I-S  AKI Hypokalemia: Stable   Normocytic anemia Hemoglobin fluctuating  8-10gm. No sign of active bleeding.  Monitor hemoglobin closely  Recent Labs    09/21/23 0523 09/22/23 0542 09/23/23 0501 09/24/23 0459 09/26/23 0951  HGB 9.2* 10.2* 8.8* 9.5* 10.1*  MCV 84.1 86.7 84.1 86.7 89.2   CAD Essential hypertension: BP well-controlled on low-dose losartan at 25 mg ( at home on 100mg ).resume Brilinta and statins cont asa   T2DM: PTA on metformin at home.  Well-controlled on SSI. Recent Labs  Lab 09/25/23 1131 09/25/23 1340 09/25/23 1621 09/25/23 2013 09/26/23 0824  GLUCAP 90 92 92 124* 107*    Obesity:Patient's Body mass index is 31.94 kg/m. : Will benefit with PCP follow-up, weight loss  healthy lifestyle and outpatient sleep evaluation.   DVT prophylaxis: heparin injection 5,000 Units Start: 09/13/23 2200 Code Status:   Code Status: Full Code Family Communication: plan of care discussed with patient/wife updated at the bedside 12/3 Patient status is: Inpatient because of fourneir gangrene  Level of care: Telemetry   Dispo: The patient is from: home w/ wife            Anticipated  disposition: Anticipate discharge tomorrow with home health  Objective: Vitals last 24 hrs: Vitals:   09/25/23 1614 09/25/23 2020 09/26/23 0506 09/26/23 0814  BP: (!) 151/89 134/85 (!) 125/114   Pulse: 60 75 82   Resp: 19 18    Temp: 98.3 F (36.8 C) 98.2 F (36.8 C) 98.5 F (36.9 C)   TempSrc: Axillary Oral Oral   SpO2: 93% 94% 96% 92%  Weight:   98.1 kg   Height:       Weight change: 0 kg  Physical Examination: General exam: alert awake, pleasant HEENT:Oral mucosa moist, Ear/Nose WNL grossly Respiratory system: Bilaterally clear BS,no use of accessory muscle Cardiovascular system: S1 & S2 +, No JVD. Gastrointestinal system: Abdomen soft,NT,ND, BS+ Nervous System: Alert, awake, moving all extremities,and following commands. Extremities: LE edema neg,distal peripheral pulses palpable and warm.  Skin: No rashes,no icterus. MSK: Normal muscle bulk,tone,power.   Medications reviewed:  Scheduled Meds:  amoxicillin-clavulanate  1 tablet Oral Q12H   ascorbic acid  500 mg Oral BID   aspirin EC  81 mg Oral Daily   Chlorhexidine Gluconate Cloth  6 each Topical Daily   docusate sodium  100 mg Oral BID   feeding supplement  237 mL Oral BID BM   fluticasone furoate-vilanterol  1 puff Inhalation Daily   heparin  5,000 Units Subcutaneous Q8H   insulin aspart  0-15 Units Subcutaneous TID WC   losartan  25 mg Oral Daily   multivitamin with minerals  1 tablet Oral Daily   pantoprazole  40 mg Oral BID AC   sodium chloride flush  10-40 mL Intracatheter Q12H   umeclidinium bromide  1 puff Inhalation Daily   zinc sulfate (50mg  elemental zinc)  220 mg Oral Daily   Continuous Infusions:    Diet Order             DIET SOFT Room service appropriate? Yes; Fluid consistency: Thin  Diet effective now                  Nutrition Problem: Increased nutrient needs Etiology:  (Fournier's gangrene) Signs/Symptoms: estimated needs Interventions: Ensure Enlive (each supplement provides  350kcal and 20 grams of protein), MVI   Intake/Output Summary (Last 24 hours) at 09/26/2023 1156 Last data filed at 09/26/2023 1000 Gross per 24 hour  Intake 1200.06 ml  Output 420 ml  Net 780.06 ml   Net IO Since Admission: -1,513.73 mL [09/26/23 1156]  Wt Readings from Last 3 Encounters:  09/26/23 98.1 kg  09/08/23 94.8 kg  08/24/23 94.8 kg     Unresulted Labs (From admission, onward)    None     Data Reviewed: I have personally reviewed following labs and imaging studies CBC: Recent Labs  Lab 09/21/23 0523 09/22/23 0542 09/23/23 0501 09/24/23 0459 09/26/23 0951  WBC 7.1 7.0 6.8 6.9 7.9  HGB 9.2* 10.2* 8.8* 9.5* 10.1*  HCT 29.6* 33.8* 28.6* 31.9* 34.6*  MCV 84.1 86.7 84.1 86.7 89.2  PLT 177 224 205 254 312   Basic Metabolic Panel:  Recent Labs  Lab 09/21/23 0523 09/22/23 0542 09/23/23 0501 09/24/23 0459 09/26/23 0951  NA 142 144 142 142 141  K 3.1* 3.4* 3.0* 3.5 4.0  CL 106 106 107 107 106  CO2 28 28 29 28 25   GLUCOSE 120* 100* 108* 101* 97  BUN 17  14 13 15 11   CREATININE 0.92 1.00 0.88 0.93 0.99  CALCIUM 8.8* 9.3 8.7* 8.9 9.2   GFR: Estimated Creatinine Clearance: 76.8 mL/min (by C-G formula based on SCr of 0.99 mg/dL). Liver Function Tests:  Recent Labs  Lab 09/22/23 0542  AST 28  ALT 50*  ALKPHOS 56  BILITOT 0.3  PROT 6.9  ALBUMIN 3.1*    No results found for this or any previous visit (from the past 240 hour(s)).   Antimicrobials: Anti-infectives (From admission, onward)    Start     Dose/Rate Route Frequency Ordered Stop   09/26/23 1115  amoxicillin-clavulanate (AUGMENTIN) 875-125 MG per tablet 1 tablet        1 tablet Oral Every 12 hours 09/26/23 1020     09/17/23 1600  Ampicillin-Sulbactam (UNASYN) 3 g in sodium chloride 0.9 % 100 mL IVPB  Status:  Discontinued        3 g 200 mL/hr over 30 Minutes Intravenous Every 6 hours 09/17/23 1406 09/26/23 1020   09/17/23 0800  ceFEPIme (MAXIPIME) 2 g in sodium chloride 0.9 % 100 mL IVPB   Status:  Discontinued        2 g 200 mL/hr over 30 Minutes Intravenous Every 8 hours 09/17/23 0717 09/17/23 1406   09/16/23 1530  linezolid (ZYVOX) tablet 600 mg  Status:  Discontinued        600 mg Oral Every 12 hours 09/16/23 1441 09/19/23 0713   09/15/23 1500  vancomycin (VANCOCIN) IVPB 1000 mg/200 mL premix  Status:  Discontinued        1,000 mg 200 mL/hr over 60 Minutes Intravenous Every 48 hours 09/13/23 2020 09/14/23 0931   09/15/23 0130  linezolid (ZYVOX) tablet 600 mg  Status:  Discontinued        600 mg Per Tube Every 12 hours 09/15/23 0116 09/16/23 1441   09/14/23 1400  ceFEPIme (MAXIPIME) 2 g in sodium chloride 0.9 % 100 mL IVPB  Status:  Discontinued        2 g 200 mL/hr over 30 Minutes Intravenous Every 24 hours 09/13/23 2020 09/17/23 0717   09/14/23 1330  linezolid (ZYVOX) tablet 600 mg  Status:  Discontinued        600 mg Oral Every 12 hours 09/14/23 0931 09/15/23 0115   09/13/23 2230  clindamycin (CLEOCIN) IVPB 600 mg  Status:  Discontinued        600 mg 100 mL/hr over 30 Minutes Intravenous Every 8 hours 09/13/23 2138 09/14/23 0931   09/13/23 2200  metroNIDAZOLE (FLAGYL) IVPB 500 mg  Status:  Discontinued        500 mg 100 mL/hr over 60 Minutes Intravenous Every 12 hours 09/13/23 2020 09/17/23 1406   09/13/23 1330  vancomycin (VANCOREADY) IVPB 2000 mg/400 mL        2,000 mg 200 mL/hr over 120 Minutes Intravenous  Once 09/13/23 1324 09/13/23 2125   09/13/23 1330  ceFEPIme (MAXIPIME) 2 g in sodium chloride 0.9 % 100 mL IVPB        2 g 200 mL/hr over 30 Minutes Intravenous  Once 09/13/23 1324 09/13/23 1426   09/13/23 1300  clindamycin (CLEOCIN) IVPB 300 mg        300 mg 100 mL/hr over 30 Minutes Intravenous  Once 09/13/23 1259 09/13/23 1352      Culture/Microbiology    Component Value Date/Time   SDES  09/13/2023 1853    WOUND Performed at [facility], [address] [place], [place] [address]  SPECREQUEST  09/13/2023 1853    PERINEAL  ABSCESS Performed at [facility], [address], [place], [place] [address]    CULT  09/13/2023 1853    RARE ENTEROCOCCUS FAECALIS RARE CUTIBACTERIUM ACNES Standardized susceptibility testing for this organism is not available. MODERATE BACTEROIDES THETAIOTAOMICRON BETA LACTAMASE POSITIVE Performed at [facility], [address] [place], [place] [address]    REPTSTATUS 09/18/2023 FINAL 09/13/2023 1853    Radiology Studies: No results found.   LOS: 13 days   Total time spent in review of labs and imaging, patient evaluation, formulation of plan, documentation and communication with family: 35 minutes  Lanae Boast, MD [facility]  09/26/2023, 11:56 AM

## 2023-09-26 NOTE — Progress Notes (Signed)
Nutrition Follow-up  INTERVENTION:   -Continue Ensure Plus High Protein po BID, each supplement provides 350 kcal and 20 grams of protein.   To aid in wound healing: -Multivitamin with minerals daily -500 mg Vitamin C BID -220 mg Zinc sulfate daily x 14 days  NUTRITION DIAGNOSIS:   Increased nutrient needs related to  (Fournier's gangrene) as evidenced by estimated needs.  Ongoing.  GOAL:   Patient will meet greater than or equal to 90% of their needs  Progressing.  MONITOR:   PO intake, Supplement acceptance, Labs, Weight trends, I & O's, Skin  ASSESSMENT:   72 yr old male with pmh of HTN, CAD (on Brilinta), Type 2 DM (Metformin), COPD, bronchiectasis, high grade prostate cancer with recent Gold seed implant and SpaceOAR gel placement for radiation treatment on 08/14/23, asbestos exposure/chemical exposure by working in [employer] for a total of 31yrs, former smoker tobacco for a total of 8 yrs (half a pack a day), and recently quit smoking marijuana over 2 years ago. Patient originally presented to [facility] ED on 11/16 for testicular/perianal pain.  Workup revealed possibility of Fournier's gangrene.  11/21: admitted, s/p I&D 11/22: s/p washout, debridement, intubated 11/24: extubated, full liquid diet 12/3: s/p scrotal washout, perineal washout and partial closure of scrotum  Patient consumed 75% of breakfast this morning. Had been accepting Ensure supplements. Accepting all vitamin supplements.  Admission weight: 209 lbs Current weight: 216 lbs  Medications: Vitamin C, Colace, Multivitamin with minerals daily, Zinc sulfate  Labs reviewed: CBGs: 90-128   Diet Order:   Diet Order             Diet regular Room service appropriate? Yes; Fluid consistency: Thin  Diet effective now                   EDUCATION NEEDS:   Not appropriate for education at this time  Skin:  Skin Assessment: Skin Integrity Issues: Skin Integrity Issues:: Incisions Incisions: 11/21  perineum, 11/22 scrotum, 12/3 perineum  Last BM:  12/2 -type 4  Height:   Ht Readings from Last 1 Encounters:  09/25/23 5\' 9"  (1.753 m)    Weight:   Wt Readings from Last 1 Encounters:  09/26/23 98.1 kg    BMI:  Body mass index is 31.94 kg/m.  Estimated Nutritional Needs:   Kcal:  2200-2400  Protein:  110-120g  Fluid:  2.2L/day   Tilda Franco, MS, RD, LDN Inpatient Clinical Dietitian Contact information available via Amion

## 2023-09-26 NOTE — Plan of Care (Signed)
  Problem: Education: Goal: Ability to describe self-care measures that may prevent or decrease complications (Diabetes Survival Skills Education) will improve Outcome: Progressing Goal: Individualized Educational Video(s) Outcome: Progressing   Problem: Coping: Goal: Ability to adjust to condition or change in health will improve Outcome: Progressing   Problem: Fluid Volume: Goal: Ability to maintain a balanced intake and output will improve Outcome: Progressing   Problem: Health Behavior/Discharge Planning: Goal: Ability to identify and utilize available resources and services will improve Outcome: Progressing Goal: Ability to manage health-related needs will improve Outcome: Progressing   Problem: Metabolic: Goal: Ability to maintain appropriate glucose levels will improve Outcome: Progressing   Problem: Nutritional: Goal: Maintenance of adequate nutrition will improve Outcome: Progressing Goal: Progress toward achieving an optimal weight will improve Outcome: Progressing   Problem: Skin Integrity: Goal: Risk for impaired skin integrity will decrease Outcome: Progressing   Problem: Tissue Perfusion: Goal: Adequacy of tissue perfusion will improve Outcome: Progressing   Problem: Education: Goal: Knowledge of General Education information will improve Description: Including pain rating scale, medication(s)/side effects and non-pharmacologic comfort measures Outcome: Progressing   Problem: Health Behavior/Discharge Planning: Goal: Ability to manage health-related needs will improve Outcome: Progressing   Problem: Clinical Measurements: Goal: Ability to maintain clinical measurements within normal limits will improve Outcome: Progressing Goal: Will remain free from infection Outcome: Progressing Goal: Diagnostic test results will improve Outcome: Progressing Goal: Respiratory complications will improve Outcome: Progressing Goal: Cardiovascular complication will  be avoided Outcome: Progressing   Problem: Activity: Goal: Risk for activity intolerance will decrease Outcome: Progressing   Problem: Nutrition: Goal: Adequate nutrition will be maintained Outcome: Progressing   Problem: Coping: Goal: Level of anxiety will decrease Outcome: Progressing   Problem: Elimination: Goal: Will not experience complications related to bowel motility Outcome: Progressing Goal: Will not experience complications related to urinary retention Outcome: Progressing   Problem: Pain Management: Goal: General experience of comfort will improve Outcome: Progressing   Problem: Safety: Goal: Ability to remain free from injury will improve Outcome: Progressing   Problem: Skin Integrity: Goal: Risk for impaired skin integrity will decrease Outcome: Progressing   Problem: Activity: Goal: Ability to tolerate increased activity will improve Outcome: Progressing   Problem: Respiratory: Goal: Ability to maintain a clear airway and adequate ventilation will improve Outcome: Progressing   Problem: Role Relationship: Goal: Method of communication will improve Outcome: Progressing

## 2023-09-26 NOTE — Care Management Important Message (Signed)
Important Message  Patient Details IM Letter given Name: Evan Brock MRN: [id number] Date of Birth: [birth year]   Important Message Given:  Yes - Medicare IM     Caren Macadam 09/26/2023, 11:25 AM

## 2023-09-26 NOTE — Progress Notes (Addendum)
   1 Day Post-Op Subjective: Pt was in good spirits this morning, up in chair.   Objective: Vital signs in last 24 hours: Temp:  [98 F (36.7 C)-98.5 F (36.9 C)] 98.5 F (36.9 C) (12/04 0506) Pulse Rate:  [60-82] 82 (12/04 0506) Resp:  [18-25] 18 (12/03 2020) BP: (125-166)/(67-114) 125/114 (12/04 0506) SpO2:  [92 %-100 %] 92 % (12/04 0814) Weight:  [90.1 kg-98.1 kg] 98.1 kg (12/04 0506)  Assessment/Plan: # Perineal abscess/Fournier's gangrene S/p I&D 11/21 and 11/22. OR closure with [name] 09/25/23 OR cultures positive for E facialis, cutibacterium acnes, and Bacteriodes thetaiotaomicron. Recommend switching from Unasyn to Augmentin if today. Foley removed this morning I completed wound care today. Small perineal tract around 5cm deep. Packed with iodiform, kerlix fluffs. High level of anxiety surrounding dressing changes. Continues to jump and hyperventilate prior to being touched. Would benefit from pre-medication.  Should be able to discharge home from a Urologic perspective once wound care in established. Case mgmt to assist w/ SNF placement.  Appreciate hospitalist and infectious disease assistance.    Intake/Output from previous day: 12/03 0701 - 12/04 0700 In: 900.1 [I.V.:500; IV Piggyback:400.1] Out: 645 [Urine:635; Blood:10]  Intake/Output this shift: No intake/output data recorded.  Physical Exam:  General: Alert and oriented CV: No cyanosis Lungs: equal chest rise Abdomen: Soft, NTND, no rebound or guarding Gu: Foley catheter in place draining clear yellow urine.  Lab Results: Recent Labs    09/24/23 0459  HGB 9.5*  HCT 31.9*   BMET Recent Labs    09/24/23 0459  NA 142  K 3.5  CL 107  CO2 28  GLUCOSE 101*  BUN 15  CREATININE 0.93  CALCIUM 8.9     Studies/Results: No results found.    LOS: 13 days   Elmon Kirschner, NP [facility] Pager: [phone]  09/26/2023, 10:19 AM

## 2023-09-26 NOTE — Progress Notes (Signed)
OT Cancellation Note  Patient Details Name: Evan Brock MRN: [id number] DOB: 11/05/1950   Cancelled Treatment:    Reason Eval/Treat Not Completed: OT screened, no needs identified, will sign off Patient reported having all ADLs under control at this time and that he had two previous hip replacements with familiarity with AE. Patient reported that he did not need further skilled OT services. OT to sign off per patients wishes.  Rosalio Loud, MS Acute Rehabilitation Department Office# [phone]  09/26/2023, 1:07 PM

## 2023-09-27 ENCOUNTER — Other Ambulatory Visit (HOSPITAL_COMMUNITY): Payer: Self-pay

## 2023-09-27 ENCOUNTER — Ambulatory Visit: Payer: Medicare HMO

## 2023-09-27 DIAGNOSIS — R6521 Severe sepsis with septic shock: Secondary | ICD-10-CM | POA: Diagnosis not present

## 2023-09-27 DIAGNOSIS — A419 Sepsis, unspecified organism: Secondary | ICD-10-CM | POA: Diagnosis not present

## 2023-09-27 LAB — GLUCOSE, CAPILLARY: Glucose-Capillary: 107 mg/dL — ABNORMAL HIGH (ref 70–99)

## 2023-09-27 MED ORDER — ZINC SULFATE 220 (50 ZN) MG PO TABS
220.0000 mg | ORAL_TABLET | Freq: Every day | ORAL | 0 refills | Status: AC
  Filled 2023-09-27: qty 30, 30d supply, fill #0

## 2023-09-27 MED ORDER — LOSARTAN POTASSIUM 25 MG PO TABS
25.0000 mg | ORAL_TABLET | Freq: Every day | ORAL | 0 refills | Status: DC
  Filled 2023-09-27: qty 30, 30d supply, fill #0

## 2023-09-27 MED ORDER — METOPROLOL SUCCINATE ER 25 MG PO TB24
25.0000 mg | ORAL_TABLET | Freq: Every day | ORAL | 0 refills | Status: DC
  Filled 2023-09-27: qty 30, 30d supply, fill #0

## 2023-09-27 MED ORDER — ENSURE ENLIVE PO LIQD
237.0000 mL | Freq: Two times a day (BID) | ORAL | 12 refills | Status: AC
  Filled 2023-09-27: qty 237, 1d supply, fill #0

## 2023-09-27 MED ORDER — AMOXICILLIN-POT CLAVULANATE 875-125 MG PO TABS
1.0000 | ORAL_TABLET | Freq: Two times a day (BID) | ORAL | 0 refills | Status: AC
  Filled 2023-09-27: qty 36, 18d supply, fill #0

## 2023-09-27 NOTE — Plan of Care (Signed)
  Problem: Coping: Goal: Ability to adjust to condition or change in health will improve Outcome: Progressing   Problem: Health Behavior/Discharge Planning: Goal: Ability to manage health-related needs will improve Outcome: Progressing   

## 2023-09-27 NOTE — TOC Transition Note (Signed)
Transition of Care [facility]) - CM/SW Discharge Note   Patient Details  Name: Evan Brock MRN: [id number] Date of Birth: 11/05/1950  Transition of Care [facility] CM/SW Contact:  Howell Rucks, RN Phone Number: 09/27/2023, 10:48 AM   Clinical Narrative:   DC order to home, [facility] PT/RN arranged with [facility], Amy rep-notified of DC. Met with pt and spouse at bedside, spouse confirmed she is comfortable with wound care, [facility] RN will continue/reinforce wound care  teaching as needed, voiced understanding. Spouse to provide transportation. No further TOC needs identified.     Final next level of care: Home w Home Health Services Barriers to Discharge: Barriers Resolved   Patient Goals and CMS Choice CMS Medicare.gov Compare Post Acute Care list provided to:: Patient Choice offered to / list presented to : Patient  Discharge Placement                         Discharge Plan and Services Additional resources added to the After Visit Summary for     Discharge Planning Services: CM Consult              DME Agency: [employer] Leisa Lenz) Date DME Agency Contacted: 09/27/23   Representative spoke with at DME Agency: [name] HH Arranged: RN, PT [facility] Agency: [facility] Date [facility] Agency Contacted: 09/27/23 Time HH Agency Contacted: [id number] Representative spoke with at [facility] Agency: Amy  Social Determinants of Health (SDOH) Interventions SDOH Screenings   Food Insecurity: No Food Insecurity (09/14/2023)  Housing: Low Risk  (09/14/2023)  Transportation Needs: No Transportation Needs (09/14/2023)  Utilities: Not At Risk (09/14/2023)  Alcohol Screen: Low Risk  (03/06/2023)  Depression (PHQ2-9): Low Risk  (03/06/2023)  Tobacco Use: Medium Risk (09/14/2023)     Readmission Risk Interventions    09/27/2023   10:47 AM 09/14/2023    9:35 AM  Readmission Risk Prevention Plan  Transportation Screening Complete Complete  PCP or Specialist Appt within 3-5 Days Complete Complete  HRI or Home Care  Consult Complete Complete  Social Work Consult for Recovery Care Planning/Counseling Complete Complete  Palliative Care Screening Not Applicable Not Applicable  Medication Review Oceanographer) Complete Complete

## 2023-09-27 NOTE — Discharge Summary (Signed)
Physician Discharge Summary  Quinta Labella WUJ:[id number] DOB: 1951/08/30 DOA: 09/13/2023  PCP: Bettey Costa, NP  Admit date: 09/13/2023 Discharge date: 09/27/2023 Recommendations for Outpatient Follow-up:  Follow up with PCP in 1 weeks-call for appointment Please obtain BMP/CBC in one week  Discharge Dispo: Home w/ [facility] Discharge Condition: Stable Code Status:   Code Status: Full Code Diet recommendation:  Diet Order             Diet regular Room service appropriate? Yes; Fluid consistency: Thin  Diet effective now                    Brief/Interim Summary: 72 yr old male with pmh of HTN, CAD (on Brilinta), Type 2 DM (Metformin), COPD, bronchiectasis, high grade prostate cancer with recent Gold seed implant and SpaceOAR gel placement for radiation treatment on 08/14/23, asbestos exposure/chemical exposure by working in [employer] for a total of 53yrs, former smoker tobacco for a total of 8 yrs (half a pack a day), and recently quit smoking marijuana over 2 years ago. Patient originally presented to [facility] ED on 11/16 for testicular/perianal pain.  Workup revealed possibility of Fournier's gangrene.  Urology was consulted.  Patient was admitted to the ICU for septic shock.  Stabilized.  Underwent surgery.  Subsequently transferred to the floor  and managing with regular dressing changes, IV antibiotics per urology and ID.  Overall patient is clinically improved, patient went for scrotal washout perineal washout and partial closure of scrotum on 12/3 and repacking of perineal abscess cavity.  Urology advised to do voiding trial and transition to oral antibiotics on 09/26/23.Marland KitchenPatient will continue Augmentin x 3 wk EOT 10/15/23. Dressing instruction has been provided and educated to patient's wife and home health care is arranged.  At this time patient feels comfortable going home with current plan of care    Discharge Diagnoses:  Principal Problem:   Septic shock (HCC) Active Problems:    AKI (acute kidney injury) (HCC)   Fournier's gangrene Fournier's gangrene Septic shock due to above-now resolved. S/P exploration and debridement and packing with [name] 11/21 Second Look completed with minimal debridement and washout, no infectious symptoms-doing well overal s/p washout on 12/3>Urology did dressing changes 12/4- small perineal tract around 5cm deep noted- packed with iodiform, kerlix fluffs.-Noticed to have high level of anxiety surrounding dressing changes -was jumping and hyperventilating prior to being touched.  Wound care will be educated to his wife and home health RN will be arranged and anticipated discharge today if patient is comfortable.  Follow-up with urology and continue wound care at home EOT for augmnetin will be 12/23   Nausea and vomiting Abdominal pain 11/30 am: Resolved.    History of prostate cancer His radiation treatment is currently on hold    Bronchiectasis/COPD/suspected OSA: Stable at this time.  Advised outpatient sleep apnea evaluation, currently on supplemental oxygen, keep on I-S   AKI Hypokalemia: Stable   Normocytic anemia Hemoglobin fluctuating  8-10gm. No sign of active bleeding.  Monitor hemoglobin closely    CAD Essential hypertension: BP well-controlled-continue losartan low-dose resume Toprol at 25 mg, resume Brilinta and statins asa and follow-up with cardiology T2DM: PTA on metformin at home.  Resume upon discharge.  Obesity:Patient's Body mass index is 31.94 kg/m. : Will benefit with PCP follow-up, weight loss  healthy lifestyle and outpatient sleep evaluation.  Consults: Urology, infectious disease Subjective: Alert awake oriented resting comfortably, eager to go home after wife comes and learns dressing today  Discharge  Exam: Vitals:   09/27/23 0428 09/27/23 0931  BP: (!) 175/92   Pulse: 81   Resp: 20   Temp: 98.1 F (36.7 C)   SpO2: 93% 94%   General: Pt is alert, awake, not in acute  distress Cardiovascular: RRR, S1/S2 +, no rubs, no gallops Respiratory: CTA bilaterally, no wheezing, no rhonchi Abdominal: Soft, NT, ND, bowel sounds + Extremities: no edema, no cyanosis  Discharge Instructions  Discharge Instructions     Discharge instructions   Complete by: As directed    Please call call MD or return to ER for similar or worsening recurring problem that brought you to hospital or if any fever,nausea/vomiting,abdominal pain, uncontrolled pain, chest pain,  shortness of breath or any other alarming symptoms.  Please follow-up your doctor as instructed in a week time and call the office for appointment.  Please avoid alcohol, smoking, or any other illicit substance and maintain healthy habits including taking your regular medications as prescribed.  You were cared for by a hospitalist during your hospital stay. If you have any questions about your discharge medications or the care you received while you were in the hospital after you are discharged, you can call the unit and ask to speak with the hospitalist on call if the hospitalist that took care of you is not available.  Once you are discharged, your primary care physician will handle any further medical issues. Please note that NO REFILLS for any discharge medications will be authorized once you are discharged, as it is imperative that you return to your primary care physician (or establish a relationship with a primary care physician if you do not have one) for your aftercare needs so that they can reassess your need for medications and monitor your lab values   Discharge wound care:   Complete by: As directed    Continue with the dressing care instruction provided per urology   Increase activity slowly   Complete by: As directed       Allergies as of 09/27/2023   No Known Allergies      Medication List     STOP taking these medications    ipratropium-albuterol 0.5-2.5 (3) MG/3ML Soln Commonly known as:  DUONEB   predniSONE 50 MG tablet Commonly known as: DELTASONE       TAKE these medications    albuterol 108 (90 Base) MCG/ACT inhaler Commonly known as: VENTOLIN HFA INHALE 1 PUFF FOUR TIMES DAILY, AS NEEDED What changed:  how much to take how to take this when to take this reasons to take this   albuterol (2.5 MG/3ML) 0.083% nebulizer solution Commonly known as: PROVENTIL Inhale 3 mLs (2.5 mg total) by nebulization every 4 (four) hours as needed for wheezing or shortness of breath. What changed: when to take this   amoxicillin-clavulanate 875-125 MG tablet Commonly known as: AUGMENTIN Take 1 tablet by mouth 2 (two) times daily with a meal for 18 days.   aspirin EC 81 MG tablet Take 1 tablet (81 mg total) by mouth daily. Swallow whole.   feeding supplement Liqd Take 237 mLs by mouth 2 (two) times daily between meals.   gabapentin 800 MG tablet Commonly known as: NEURONTIN Take 800 mg by mouth See admin instructions. Take 800 mg by mouth three times a day and an additional 800 mg once a day as needed for pain   GoodSense Ibuprofen 200 MG tablet Generic drug: ibuprofen Take 800 mg by mouth every 6 (six) hours as needed (for pain).  HYDROcodone-acetaminophen 10-325 MG tablet Commonly known as: NORCO Take 1 tablet by mouth See admin instructions. Take 1 tablet by mouth three times a day and an additional 1 tablet up to two times a day as needed for pain   losartan 25 MG tablet Commonly known as: COZAAR Take 1 tablet (25 mg total) by mouth daily. Start taking on: September 28, 2023 What changed:  medication strength how much to take   metFORMIN 500 MG tablet Commonly known as: GLUCOPHAGE Take 1 tablet (500 mg total) by mouth 2 (two) times daily with a meal. Resume on Sunday 02/19/2022 What changed: additional instructions   metoprolol succinate 25 MG 24 hr tablet Commonly known as: Toprol XL Take 1 tablet (25 mg total) by mouth daily. Hold for hr< 60 or sbp  <120 What changed:  medication strength See the new instructions.   nitroGLYCERIN 0.4 MG SL tablet Commonly known as: NITROSTAT PLACE 1 TABLET UNDER THE TONGUE EVERY 5 MINUTES AS NEEDED FOR CHEST PAIN What changed: See the new instructions.   rosuvastatin 40 MG tablet Commonly known as: CRESTOR TAKE 1 TABLET BY MOUTH AT BEDTIME   tamsulosin 0.4 MG Caps capsule Commonly known as: FLOMAX Take 0.8 mg by mouth at bedtime.   ticagrelor 90 MG Tabs tablet Commonly known as: Brilinta Take 1 tablet (90 mg total) by mouth 2 (two) times daily.   tiZANidine 4 MG tablet Commonly known as: ZANAFLEX Take 4 mg by mouth every 8 (eight) hours as needed for muscle spasms.   Trelegy Ellipta 100-62.5-25 MCG/ACT Aepb Generic drug: Fluticasone-Umeclidin-Vilant Take 1 puff by mouth daily as needed ("for flares").   vitamin C 1000 MG tablet Take 2,000 mg by mouth daily.   Vitamin D3 1000 units Caps Take 1,000 Units by mouth daily.   Zinc Sulfate 220 (50 Zn) MG Tabs Take 1 tablet (220 mg total) by mouth daily. Start taking on: September 28, 2023               Durable Medical Equipment  (From admission, onward)           Start     Ordered   09/24/23 1401  For home use only DME Walker rolling  Once       Question Answer Comment  Walker: With 5 Inch Wheels   Patient needs a walker to treat with the following condition Difficulty in walking, not elsewhere classified      12 /02/24 1400              Discharge Care Instructions  (From admission, onward)           Start     Ordered   09/27/23 0000  Discharge wound care:       Comments: Continue with the dressing care instruction provided per urology   09/27/23 1039            Follow-up Information     [facility] Follow up.   Why: [facility]   2-wheeled rolling walker Contact information: [address] [place] [address] [phone]         [facility]  Follow up.   Why: [facility] Home Health Nurse for wound  care Contact information: [address] [place] [address] [phone]         Bettey Costa, NP Follow up in 1 week(s).   Specialty: Nurse Practitioner Contact information: [phone]  [address] [place] [address] [phone]                No Known Allergies  The results of significant diagnostics from this hospitalization (including imaging, microbiology, ancillary and laboratory) are listed below for reference.    Microbiology: No results found for this or any previous visit (from the past 240 hour(s)).  Procedures/Studies: DG Abd Portable 1V  Result Date: 09/18/2023 CLINICAL DATA:  Nausea and vomiting EXAM: PORTABLE ABDOMEN - 1 VIEW COMPARISON:  Four days ago FINDINGS: Diffuse bowel gas without obstructive pattern or over distension. No abnormal stool retention. No concerning mass effect or calcification. Fiducial markers overlap the prostate. Bilateral hip replacement. IMPRESSION: Normal bowel gas pattern. Electronically Signed   By: Tiburcio Pea M.D.   On: 09/18/2023 10:21   DG Abd 1 View  Result Date: 09/14/2023 CLINICAL DATA:  Tube placement EXAM: ABDOMEN - 1 VIEW limited for tube placement COMPARISON:  None Available. FINDINGS: Limited x-ray of the upper abdomen and thorax demonstrates enteric tube with the tip overlying the distal stomach. Please see separate chest x-ray IMPRESSION: Enteric tube with tip overlying the distal stomach.  Limited x-ray Electronically Signed   By: Karen Kays M.D.   On: 09/14/2023 18:32   DG CHEST PORT 1 VIEW  Result Date: 09/14/2023 CLINICAL DATA:  Line placement EXAM: PORTABLE CHEST 1 VIEW COMPARISON:  X-ray 06/06/2023. FINDINGS: Limited portable x-ray. The left lower thorax is clipped off the edge of the film. ET tube in place with tip seen proximally 3.8 cm above the carina. Enteric tube in place with the  tip extending beneath the diaphragm. Right IJ line with the tip along the central SVC above the right atrium. Normal cardiopericardial silhouette when adjusting for level of inflation and technique. Calcified tortuous aorta. Basilar atelectasis. No pneumothorax or edema. Overlapping cardiac leads. Film is under penetrated. IMPRESSION: Limited x-ray. ET tube and enteric tube in place.  Right IJ line.  No pneumothorax. Basilar opacity. Electronically Signed   By: Karen Kays M.D.   On: 09/14/2023 18:31   ECHOCARDIOGRAM COMPLETE  Result Date: 09/14/2023    ECHOCARDIOGRAM REPORT   Patient Name:   Evan Brock Date of Exam: 09/14/2023 Medical Rec #:  [id number]    Height:       69.0 in Accession #:    [id number]   Weight:       207.2 lb Date of Birth:  05-07-1951    BSA:          2.097 m Patient Age:    72 years     BP:           97/53 mmHg Patient Gender: M            HR:           86 bpm. Exam Location:  Inpatient Procedure: 2D Echo, Cardiac Doppler and Color Doppler Indications:    Shock  History:        Patient has prior history of Echocardiogram examinations, most                 recent 02/18/2022. Previous Myocardial Infarction and CAD, COPD,                 Signs/Symptoms:Shortness of Breath; Risk Factors:Diabetes,                 Hypertension, Dyslipidemia and Former Smoker. Hx of asbestos  exposure, prostate CA, marijuana use.  Sonographer:    Milda Smart Referring Phys: [id number] Henrene Hawking  Sonographer Comments: Image acquisition challenging due to patient body habitus and Image acquisition challenging due to respiratory motion. IMPRESSIONS  1. Left ventricular ejection fraction, by estimation, is 60 to 65%. The left ventricle has normal function. The left ventricle has no regional wall motion abnormalities. There is mild concentric left ventricular hypertrophy. Left ventricular diastolic parameters are indeterminate.  2. Right ventricular systolic function is normal. The right  ventricular size is mildly enlarged. There is severely elevated pulmonary artery systolic pressure. The estimated right ventricular systolic pressure is 63.7 mmHg.  3. Right atrial size was mildly dilated.  4. The mitral valve was not well visualized. Mild to moderate mitral valve regurgitation. No evidence of mitral stenosis.  5. Tricuspid valve regurgitation is mild to moderate.  6. The aortic valve is tricuspid. There is mild calcification of the aortic valve. Aortic valve regurgitation is not visualized. Aortic valve sclerosis/calcification is present, without any evidence of aortic stenosis.  7. Aortic dilatation noted. There is mild dilatation of the ascending aorta, measuring 41 mm.  8. The inferior vena cava is dilated in size with >50% respiratory variability, suggesting right atrial pressure of 8 mmHg. Comparison(s): Changes from prior study are noted. RVSP increased from 53 mmHg to 63.7 mmHg. Conclusion(s)/Recommendation(s): Severely elevated PA pressures, dilated coronary sinus, otherwise largely unremarkable study. FINDINGS  Left Ventricle: Left ventricular ejection fraction, by estimation, is 60 to 65%. The left ventricle has normal function. The left ventricle has no regional wall motion abnormalities. The left ventricular internal cavity size was normal in size. There is  mild concentric left ventricular hypertrophy. Left ventricular diastolic parameters are indeterminate. Right Ventricle: The right ventricular size is mildly enlarged. Right vetricular wall thickness was not well visualized. Right ventricular systolic function is normal. There is severely elevated pulmonary artery systolic pressure. The tricuspid regurgitant velocity is 3.73 m/s, and with an assumed right atrial pressure of 8 mmHg, the estimated right ventricular systolic pressure is 63.7 mmHg. Left Atrium: Left atrial size was normal in size. Right Atrium: Right atrial size was mildly dilated. Pericardium: Dilated coronary sinus.  Trivial pericardial effusion is present. Mitral Valve: The mitral valve was not well visualized. Mild to moderate mitral valve regurgitation. No evidence of mitral valve stenosis. MV peak gradient, 4.8 mmHg. The mean mitral valve gradient is 3.0 mmHg. Tricuspid Valve: The tricuspid valve is grossly normal. Tricuspid valve regurgitation is mild to moderate. No evidence of tricuspid stenosis. Aortic Valve: The aortic valve is tricuspid. There is mild calcification of the aortic valve. Aortic valve regurgitation is not visualized. Aortic valve sclerosis/calcification is present, without any evidence of aortic stenosis. Aortic valve mean gradient measures 10.0 mmHg. Aortic valve peak gradient measures 19.0 mmHg. Aortic valve area, by VTI measures 3.04 cm. Pulmonic Valve: The pulmonic valve was grossly normal. Pulmonic valve regurgitation is not visualized. No evidence of pulmonic stenosis. Aorta: Aortic dilatation noted. There is mild dilatation of the ascending aorta, measuring 41 mm. Venous: The inferior vena cava is dilated in size with greater than 50% respiratory variability, suggesting right atrial pressure of 8 mmHg. IAS/Shunts: The interatrial septum was not well visualized.  LEFT VENTRICLE PLAX 2D LVIDd:         4.90 cm     Diastology LVIDs:         3.40 cm     LV e' medial:    5.77 cm/s LV PW:  1.40 cm     LV E/e' medial:  15.6 LV IVS:        1.30 cm     LV e' lateral:   12.30 cm/s LVOT diam:     2.40 cm     LV E/e' lateral: 7.3 LV SV:         107 LV SV Index:   51 LVOT Area:     4.52 cm  LV Volumes (MOD) LV vol d, MOD A2C: 73.1 ml LV vol d, MOD A4C: 76.1 ml LV vol s, MOD A2C: 28.7 ml LV vol s, MOD A4C: 27.9 ml LV SV MOD A2C:     44.4 ml LV SV MOD A4C:     76.1 ml LV SV MOD BP:      47.4 ml RIGHT VENTRICLE             IVC RV Basal diam:  4.02 cm     IVC diam: 2.30 cm RV Mid diam:    2.41 cm RV S prime:     17.10 cm/s TAPSE (M-mode): 2.3 cm LEFT ATRIUM             Index        RIGHT ATRIUM            Index LA diam:        2.90 cm 1.38 cm/m   RA Area:     19.90 cm LA Vol (A2C):   35.1 ml 16.73 ml/m  RA Volume:   58.40 ml  27.84 ml/m LA Vol (A4C):   46.1 ml 21.98 ml/m LA Biplane Vol: 39.0 ml 18.59 ml/m  AORTIC VALVE AV Area (Vmax):    2.74 cm AV Area (Vmean):   2.97 cm AV Area (VTI):     3.04 cm AV Vmax:           218.00 cm/s AV Vmean:          141.000 cm/s AV VTI:            0.353 m AV Peak Grad:      19.0 mmHg AV Mean Grad:      10.0 mmHg LVOT Vmax:         132.00 cm/s LVOT Vmean:        92.500 cm/s LVOT VTI:          0.237 m LVOT/AV VTI ratio: 0.67  AORTA Ao Root diam: 3.70 cm Ao Asc diam:  4.10 cm MITRAL VALVE                TRICUSPID VALVE MV Area (PHT): 3.76 cm     TR Peak grad:   55.7 mmHg MV Area VTI:   4.27 cm     TR Mean grad:   40.0 mmHg MV Peak grad:  4.8 mmHg     TR Vmax:        373.00 cm/s MV Mean grad:  3.0 mmHg     TR Vmean:       305.0 cm/s MV Vmax:       1.10 m/s MV Vmean:      80.6 cm/s    SHUNTS MV Decel Time: 202 msec     Systemic VTI:  0.24 m MR Peak grad: 83.5 mmHg     Systemic Diam: 2.40 cm MR Mean grad: 57.0 mmHg MR Vmax:      457.00 cm/s MR Vmean:     363.0 cm/s MV E velocity: 90.30 cm/s MV A velocity: 103.00 cm/s MV  E/A ratio:  0.88 Jodelle Red MD Electronically signed by Jodelle Red MD Signature Date/Time: 09/14/2023/12:16:17 PM    Final    CT ABDOMEN PELVIS WO CONTRAST  Result Date: 09/13/2023 CLINICAL DATA:  History of prostate cancer with near syncopal episode. Ongoing groin pain and worsening suspected perianal abscess/fistula. * Tracking Code: BO * EXAM: CT ABDOMEN AND PELVIS WITHOUT CONTRAST TECHNIQUE: Multidetector CT imaging of the abdomen and pelvis was performed following the standard protocol without IV contrast. RADIATION DOSE REDUCTION: This exam was performed according to the departmental dose-optimization program which includes automated exposure control, adjustment of the mA and/or kV according to patient size and/or use of iterative  reconstruction technique. COMPARISON:  CT abdomen and pelvis dated 09/08/2023 and multiple priors dating back to 06/10/2013 FINDINGS: Lower chest: Similar mild bilateral bilateral lower lobe, middle lobe bronchiectasis with scattered tree-in-bud nodules and subsegmental mucous plugging. No pleural effusion or pneumothorax demonstrated. Partially imaged heart size is normal. Hepatobiliary: No focal hepatic lesions. No intra or extrahepatic biliary ductal dilation. Normal gallbladder. Pancreas: No focal lesions or main ductal dilation. Spleen: Normal in size without focal abnormality. Adrenals/Urinary Tract: No adrenal nodules. No suspicious renal mass on this noncontrast enhanced examination, calculi or hydronephrosis. Suboptimal evaluation of the bladder due to beam hardening artifact from hip arthroplasties. Stomach/Bowel: Normal appearance of the stomach. No evidence of bowel wall thickening, distention, or inflammatory changes. Colonic diverticulosis without acute diverticulitis. Normal appendix. Vascular/Lymphatic: Aortic atherosclerosis. No enlarged abdominal or pelvic lymph nodes. Reproductive: Suboptimal evaluation of the prostate due to beam hardening artifact from hip arthroplasties. No gross abnormality on the [facility] sequence. Prostate fiducials in-situ. Heterogeneous hyperdensity posterior to the prostate likely corresponds to Hydrogel perirectal spacer material. Other: No free fluid, fluid collection, or free air. Non FDG-avid cystic focus within the posterior mesentery measures 3.6 x 2.8 cm (4:43) closely associated with the proximal duodenum. In retrospect, this structure has been present since at least 2014 but slowly increasing in size in the interval. Musculoskeletal: No acute or abnormal lytic or blastic osseous lesions. Bilateral hip arthroplasties. Small paraumbilical Richter hernia. Multilevel degenerative changes of the partially imaged thoracic and lumbar spine. Interval development of  subcutaneous emphysema at the site of previously noted perianal fluid collection with anterior and inferior extension of gas into the perineum and posterior scrotum. IMPRESSION: 1. Interval development of subcutaneous emphysema at the site of previously noted perianal fluid collection with anterior and inferior extension of gas into the perineum and posterior scrotum, which may reflect sequela of radiation therapy, however Fournier gangrene is not excluded. 2. Heterogeneous hypodensity within the SpaceOAR Hydrogel material, which may reflect iatrogenic gas introduced at the time of injection, however superimposed infection can not be excluded. 3. Similar mild bilateral lower lobe, middle lobe bronchiectasis with scattered tree-in-bud nodules and subsegmental mucous plugging, likely due to chronic atypical infection such as Mycobacterium avium complex. 4. Non FDG-avid cystic focus within the posterior mesentery measures 3.6 x 2.8 cm, closely associated with the proximal duodenum. In retrospect, this structure has been present since at least 2014 but slowly increasing in size in the interval. 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 5.  Aortic Atherosclerosis (ICD10-I70.0). These results were called by telephone at the time of interpretation on 09/13/2023 at 1:03 pm to provider [name] , who verbally acknowledged these results. Electronically Signed   By: Agustin Cree M.D.   On: 09/13/2023 13:03   CT Head Wo Contrast  Result Date: 09/13/2023 CLINICAL  DATA:  Syncope.  Dizziness. EXAM: CT HEAD WITHOUT CONTRAST TECHNIQUE: Contiguous axial images were obtained from the base of the skull through the vertex without intravenous contrast. RADIATION DOSE REDUCTION: This exam was performed according to the departmental dose-optimization program which includes automated exposure control, adjustment of the mA and/or kV according to patient size and/or use of iterative  reconstruction technique. COMPARISON:  Head CT 02/16/2021 FINDINGS: Brain: There is no evidence of an acute infarct, intracranial hemorrhage, mass, midline shift, or extra-axial fluid collection. The ventricles and sulci are normal. Vascular: No hyperdense vessel or unexpected calcification. Skull: No acute fracture or suspicious osseous lesion. Sinuses/Orbits: Partially visualized fluid in the left maxillary sinus, also present on the prior CT with chronically opacified left maxillary sinus ostium and infundibulum. Clear mastoid air cells. Unremarkable orbits. Other: None. IMPRESSION: No evidence of acute intracranial abnormality. Electronically Signed   By: Sebastian Ache M.D.   On: 09/13/2023 12:23   CT ABDOMEN PELVIS W CONTRAST  Result Date: 09/08/2023 CLINICAL DATA:  72 year old male history of prostate cancer. Suspected perianal abscess or fistula. * Tracking Code: BO * EXAM: CT ABDOMEN AND PELVIS WITH CONTRAST TECHNIQUE: Multidetector CT imaging of the abdomen and pelvis was performed using the standard protocol following bolus administration of intravenous contrast. RADIATION DOSE REDUCTION: This exam was performed according to the departmental dose-optimization program which includes automated exposure control, adjustment of the mA and/or kV according to patient size and/or use of iterative reconstruction technique. CONTRAST:  OMNIPAQUE IOHEXOL 300 MG/ML  SOLN COMPARISON:  CT of the abdomen and pelvis 05/25/2021. PET-CT 06/18/2023. FINDINGS: Lower chest: Scattered areas of cylindrical and varicose bronchiectasis in the lung bases bilaterally with thickening of the peribronchovascular interstitium and surrounding peribronchovascular ground-glass attenuation as well as micro and macronodularity. Atherosclerotic calcifications in the descending thoracic aorta as well as the left circumflex and right coronary arteries. Thickening and calcification of the aortic valve. Hepatobiliary: No definite  suspicious cystic or solid hepatic lesions. No intra or extrahepatic biliary ductal dilatation. Gallbladder is unremarkable in appearance. Pancreas: No pancreatic mass. No pancreatic ductal dilatation. No pancreatic or peripancreatic fluid collections or inflammatory changes. Spleen: Unremarkable. Adrenals/Urinary Tract: Bilateral kidneys and adrenal glands are normal in appearance. No hydroureteronephrosis. Urinary bladder is completely obscured by beam hardening artifact from the patient's bilateral hip arthroplasties. Stomach/Bowel: The appearance of the stomach is normal. No pathologic dilatation of small bowel or colon. Numerous colonic diverticuli are noted, without surrounding inflammatory changes to suggest an acute diverticulitis at this time. Normal appendix. Vascular/Lymphatic: Atherosclerotic calcifications are noted in the abdominal aorta and pelvic vasculature. No lymphadenopathy noted in the abdomen or pelvis. Reproductive: Prostate gland and seminal vesicles are completely obscured by beam hardening artifact from the patient's bilateral hip arthroplasties. Other: No significant volume of ascites. No pneumoperitoneum. The perineum is poorly visualized secondary to extensive beam hardening artifact. However, inferior to the anus there is some focal abnormality best appreciated on axial image 88 of series 2 where on the left side there is a 1.2 x 2.8 cm intermediate attenuation collection, adjacent to some high attenuation material on the right (which could represent packing material or potentially contrast-enhancement). Musculoskeletal: There are no aggressive appearing lytic or blastic lesions noted in the visualized portions of the skeleton. IMPRESSION: 1. Secondary to beam hardening artifact related to the patient's bilateral hip arthroplasties, the perineum is poorly visualized, however, there is clearly a perianal abnormality which could represent a perianal abscess or fistula. This appears new  compared to  prior PET-CT 02/16/2023. Correlation with physical examination is recommended. 2. No other acute findings are noted in the abdomen or pelvis. 3. Colonic diverticulosis without evidence of acute diverticulitis at this time. 4. Extensive bronchiectasis and post infectious or inflammatory scarring and areas of chronic mucoid impaction noted in the visualized lung bases. 5. There are calcifications of the aortic valve. Echocardiographic correlation for evaluation of potential valvular dysfunction may be warranted if clinically indicated. 6. Aortic atherosclerosis, in addition to two-vessel coronary artery disease. Assessment for potential risk factor modification, dietary therapy or pharmacologic therapy may be warranted, if clinically indicated. 7. Additional incidental findings, as above. Electronically Signed   By: Trudie Reed M.D.   On: 09/08/2023 11:36    Labs: BNP (last 3 results) No results for input(s): "BNP" in the last 8760 hours. Basic Metabolic Panel: Recent Labs  Lab 09/21/23 0523 09/22/23 0542 09/23/23 0501 09/24/23 0459 09/26/23 0951  NA 142 144 142 142 141  K 3.1* 3.4* 3.0* 3.5 4.0  CL 106 106 107 107 106  CO2 28 28 29 28 25   GLUCOSE 120* 100* 108* 101* 97  BUN 17 14 13 15 11   CREATININE 0.92 1.00 0.88 0.93 0.99  CALCIUM 8.8* 9.3 8.7* 8.9 9.2   Liver Function Tests: Recent Labs  Lab 09/22/23 0542  AST 28  ALT 50*  ALKPHOS 56  BILITOT 0.3  PROT 6.9  ALBUMIN 3.1*   Recent Labs  Lab 09/22/23 0542  LIPASE 39   No results for input(s): "AMMONIA" in the last 168 hours. CBC: Recent Labs  Lab 09/21/23 0523 09/22/23 0542 09/23/23 0501 09/24/23 0459 09/26/23 0951  WBC 7.1 7.0 6.8 6.9 7.9  HGB 9.2* 10.2* 8.8* 9.5* 10.1*  HCT 29.6* 33.8* 28.6* 31.9* 34.6*  MCV 84.1 86.7 84.1 86.7 89.2  PLT 177 224 205 254 312  Cardiac Enzymes: No results for input(s): "CKTOTAL", "CKMB", "CKMBINDEX", "TROPONINI" in the last 168 hours. BNP: Invalid input(s):  "POCBNP" CBG: Recent Labs  Lab 09/26/23 0824 09/26/23 1204 09/26/23 1628 09/26/23 2124 09/27/23 0742  GLUCAP 107* 124* 98 97 107*  Anemia work up No results for input(s): "VITAMINB12", "FOLATE", "FERRITIN", "TIBC", "IRON", "RETICCTPCT" in the last 72 hours. Urinalysis    Component Value Date/Time   COLORURINE YELLOW 09/13/2023 1603   APPEARANCEUR CLOUDY (A) 09/13/2023 1603   LABSPEC 1.015 09/13/2023 1603   PHURINE 5.0 09/13/2023 1603   GLUCOSEU NEGATIVE 09/13/2023 1603   HGBUR MODERATE (A) 09/13/2023 1603   BILIRUBINUR NEGATIVE 09/13/2023 1603   KETONESUR NEGATIVE 09/13/2023 1603   PROTEINUR 100 (A) 09/13/2023 1603   UROBILINOGEN 1.0 10/01/2013 1851   NITRITE NEGATIVE 09/13/2023 1603   LEUKOCYTESUR NEGATIVE 09/13/2023 1603   Sepsis Labs Recent Labs  Lab 09/22/23 0542 09/23/23 0501 09/24/23 0459 09/26/23 0951  WBC 7.0 6.8 6.9 7.9   Microbiology No results found for this or any previous visit (from the past 240 hour(s)).   Time coordinating discharge: 35 minutes  SIGNED: Lanae Boast, MD  [facility] 09/27/2023, 11:37 AM  If 7PM-7AM, please contact night-coverage [email or web address]

## 2023-09-27 NOTE — Progress Notes (Signed)
   2 Days Post-Op Subjective: Pt was in good spirits this morning, up in chair.   Objective: Vital signs in last 24 hours: Temp:  [98.1 F (36.7 C)-98.7 F (37.1 C)] 98.1 F (36.7 C) (12/05 0428) Pulse Rate:  [70-81] 81 (12/05 0428) Resp:  [20] 20 (12/05 0428) BP: (102-175)/(58-92) 175/92 (12/05 0428) SpO2:  [93 %-100 %] 94 % (12/05 0931) FiO2 (%):  [21 %] 21 % (12/05 0931) Weight:  [91.4 kg] 91.4 kg (12/05 0426)  Assessment/Plan: # Perineal abscess/Fournier's gangrene S/p I&D 11/21 and 11/22. OR closure with [name] 09/25/23 OR cultures positive for E facialis, cutibacterium acnes, and Bacteriodes thetaiotaomicron. Switched from Unasyn to Augmentin x3 weeks Foley removed 12/4 I completed wound care with his wife, who was comfortable and did an excellent job with his packing. 5-6cm deep. Tolerated much better today. Supplies to be provided to family on discharge.  Should be able to discharge home from a Urologic perspective once wound care in established.  Plans to discharge today with home health.   Intake/Output from previous day: 12/04 0701 - 12/05 0700 In: 600 [P.O.:600] Out: 400 [Urine:400]  Intake/Output this shift: No intake/output data recorded.  Physical Exam:  General: Alert and oriented CV: No cyanosis Lungs: equal chest rise Abdomen: Soft, NTND, no rebound or guarding Gu: small perineal wound. 5-6cm deep. No foul odor, purulence, or dishwater drainage. Scrotal suture line with serous drainage tinged with old blood.   Lab Results: Recent Labs    09/26/23 0951  HGB 10.1*  HCT 34.6*   BMET Recent Labs    09/26/23 0951  NA 141  K 4.0  CL 106  CO2 25  GLUCOSE 97  BUN 11  CREATININE 0.99  CALCIUM 9.2     Studies/Results: No results found.    LOS: 14 days   Elmon Kirschner, NP [facility] Pager: [phone]  09/27/2023, 10:16 AM

## 2023-09-28 ENCOUNTER — Ambulatory Visit: Payer: Medicare HMO

## 2023-10-01 ENCOUNTER — Ambulatory Visit: Payer: Medicare HMO

## 2023-10-02 ENCOUNTER — Ambulatory Visit: Payer: Medicare HMO

## 2023-10-03 ENCOUNTER — Ambulatory Visit: Payer: Medicare HMO

## 2023-10-04 ENCOUNTER — Ambulatory Visit: Payer: Medicare HMO

## 2023-10-04 ENCOUNTER — Encounter (HOSPITAL_COMMUNITY): Payer: Self-pay | Admitting: *Deleted

## 2023-10-04 ENCOUNTER — Ambulatory Visit (HOSPITAL_COMMUNITY)
Admission: EM | Admit: 2023-10-04 | Discharge: 2023-10-04 | Disposition: A | Discharge status: Home / Self Care | Payer: Medicare HMO | Attending: Internal Medicine | Admitting: Internal Medicine

## 2023-10-04 DIAGNOSIS — I10 Essential (primary) hypertension: Secondary | ICD-10-CM | POA: Diagnosis not present

## 2023-10-04 MED ORDER — LOSARTAN POTASSIUM 25 MG PO TABS: 50.0000 mg | ORAL_TABLET | Freq: Every day | ORAL | 0 refills | Status: DC

## 2023-10-04 NOTE — Discharge Instructions (Signed)
I have increased your losartan from 25 mg orally daily to 50 mg orally daily. Continue taking metoprolol XL 25 mg orally daily Please continue to increase your physical activity at home Keep your appointment with your primary care provider next week Please keep a log of your blood pressure readings and presented to your primary care physician when you see him/her in the office. Please feel free to return to urgent care if you have worsening symptoms.

## 2023-10-04 NOTE — ED Triage Notes (Signed)
Pt states he has been having a hard time getting his BP lowered. He has had a wound care nurse come to the houe today and she advisedd him to come to UC. His BP meds were change at last hospital stay and he is worried they were changed too much.   Today he took an old losartan rx for 100mg  to see if it would help and states he was decreased to losartan 25mg .

## 2023-10-05 ENCOUNTER — Ambulatory Visit: Payer: Medicare HMO

## 2023-10-06 NOTE — ED Provider Notes (Signed)
[facility]    CSN: [id number] Arrival date & time: 10/04/23  1615      History   Chief Complaint Chief Complaint  Patient presents with   Hypertension    HPI Evan Brock is a 72 y.o. male comes to urgent care with complaints of elevated blood pressure.  Patient was recently discharged from the hospital for scrotal section.  Patient was on metoprolol 50 mg orally daily and losartan 100 mg orally daily prior to his admission to the hospital.  At discharge, patient's antihypertensive medications doses were adjusted and patient is currently on losartan 25 mg orally daily and metoprolol XL 25 daily.  Patient complains that his blood pressure has been elevated with systolic blood pressure in the 160s.  This has been persistent since she got home from the hospital.  He denies any headache, chest pain or abdominal pain.  Scrotal wound is healing well.  He denies any significant pain.  No dizziness, near-syncope or syncopal episodes.Marland Kitchen   HPI  Past Medical History:  Diagnosis Date   Anticoagulant long-term use    brilinta/ asa--- managed by cardiology   Benign localized prostatic hyperplasia with lower urinary tract symptoms (LUTS)    Chronic constipation    OTC laxative prn   Chronic pain syndrome    pain management--- dr foster [facility])   neck and back   COPD (chronic obstructive pulmonary disease) (HCC)    followed by pcp ---  not on oxygen (previous seen by pulmonology-- dr byrum--- lov in epic 07-12-2020);   last exacerbation [facility]-23-2023 [facility]  pneumonia/ hypoxia w/ O2 82% refused ED,  then ED visit 05-17-2023 hypoxia / treated w/ nebs, antibiotics;  back at urgent care 06-06-2023  exacerbation / pneumonia/ +COVID,  O2 90% treated w/ nebs/ prednisone/ antibiotics   Coronary artery disease 01/2022   cardiologist--- dr Valentino Saxon;  NSTEMI  s/p cath 02-16-2022  thrombectomy / PTCA w/ DES to LCX;   DDD (degenerative disc disease), cervical    DDD (degenerative disc  disease), lumbosacral    ED (erectile dysfunction)    Exertional shortness of breath    08-08-2023   unable to do stairs or yard word, but can do household chores ok   History of acute respiratory failure    several times w/ hypoxia   History of asbestos exposure    History of non-ST elevation myocardial infarction (NSTEMI) 02/16/2022   Hyperlipidemia, mixed    Hypertension    Malignant neoplasm prostate [facility]) 12/2022   urologist-- dr pace/  radiation oncologist--- dr Kathrynn Running;   dx 03/ 2024, gleason 4+5   Moderate persistent asthma    OA (osteoarthritis)    S/P drug eluting coronary stent placement 02/17/2023   x1 to LCx   Type 2 diabetes mellitus (HCC)    followed by pcp   (08-08-2023  per pt checks blood sugar one a month)   Wears partial dentures    upper only    Patient Active Problem List   Diagnosis Date Noted   Septic shock (HCC) 09/13/2023   Fournier's 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   Acute hypoxic respiratory failure (HCC) 09/08/2022   Status post coronary artery stent placement    S/P PTCA (percutaneous transluminal coronary angioplasty)    Non-insulin dependent type 2 diabetes mellitus (HCC)    Mixed hyperlipidemia    Coronary atherosclerosis due to calcified coronary lesion    Former smoker    Class 1 obesity due to  excess calories with serious comorbidity and body mass index (BMI) of 31.0 to 31.9 in adult    Non-ST elevation (NSTEMI) myocardial infarction (HCC) 02/16/2022   NSTEMI (non-ST elevated myocardial infarction) (HCC) 02/16/2022   Acute respiratory failure with hypoxia (HCC) 11/12/2021   CAP (community acquired pneumonia) 02/16/2021   Hypoxia 02/16/2021   COPD with acute exacerbation (HCC) 02/16/2021   Type 2 diabetes mellitus with obesity (HCC) 02/16/2021   AKI (acute kidney injury) (HCC) 02/16/2021   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   Leukocytosis 10/04/2015   COPD (chronic obstructive pulmonary disease)  (HCC) 10/01/2015   Essential hypertension, benign 03/01/2015   Asthma with acute exacerbation 01/26/2015   Thrombocytopenia, unspecified (HCC) 11/06/2013   Acute respiratory failure (HCC) with hypoxia 10/01/2013   Acute renal failure (HCC) 10/01/2013    Past Surgical History:  Procedure Laterality Date   CORONARY STENT INTERVENTION N/A 02/16/2022   Procedure: CORONARY STENT INTERVENTION;  Surgeon: Elder Negus, MD;  [facility];  Service: Cardiovascular;  Laterality: N/A;   CORONARY THROMBECTOMY N/A 02/16/2022   Procedure: Coronary Thrombectomy;  Surgeon: Elder Negus, MD;  [facility]: [facility];  Service: Cardiovascular;  Laterality: N/A;   GOLD SEED IMPLANT N/A 08/14/2023   Procedure: GOLD SEED IMPLANT;  Surgeon: [name], MD;  Location: [facility];  Service: Urology;  Laterality: N/A;  30 MINUTES   INCISION AND DRAINAGE ABSCESS N/A 09/13/2023   Procedure: INCISION AND DRAINAGE ABSCESS;  Surgeon: Rene Paci, MD;  Location: [facility];  Service: Urology;  Laterality: N/A;   LEFT HEART CATH AND CORONARY ANGIOGRAPHY N/A 02/16/2022   Procedure: LEFT HEART CATH AND CORONARY ANGIOGRAPHY;  Surgeon: Elder Negus, MD;  [facility];  Service: Cardiovascular;  Laterality: N/A;   SCROTAL EXPLORATION N/A 09/14/2023   Procedure: PERINEAL IRRIGATION AND DEBRIDEMENT, WOUND DRESSING CHANGE;  Surgeon: [name], MD;  Location: [facility];  Service: Urology;  Laterality: N/A;   SCROTAL EXPLORATION N/A 09/25/2023   Procedure: PERINEAL WASHOUT AND CLOSURE, SCROTAL EXPLORATION, AND REPACKING OF ABSCESS CAVITY;  Surgeon: [name], MD;  Location: [facility];  Service: Urology;  Laterality: N/A;   SPACE OAR INSTILLATION N/A 08/14/2023   Procedure: SPACE OAR INSTILLATION;  Surgeon: [name], MD;  Location: [facility];  Service: Urology;  Laterality: N/A;   TOTAL HIP ARTHROPLASTY  Right 01/11/2009   @[facility]  by dr Lequita Halt   TOTAL HIP ARTHROPLASTY Left 08/19/2013   Procedure: TOTAL HIP ARTHROPLASTY ANTERIOR APPROACH;  Surgeon: Velna Ochs, MD;  Location: [facility];  Service: Orthopedics;  Laterality: Left;       Home Medications    Prior to Admission medications   Medication Sig Start Date End Date Taking? Authorizing Provider  amoxicillin-clavulanate (AUGMENTIN) 875-125 MG tablet Take 1 tablet by mouth 2 (two) times daily with a meal for 18 days. 09/27/23 10/15/23 Yes Lanae Boast, MD  Ascorbic Acid (VITAMIN C) 1000 MG tablet Take 2,000 mg by mouth daily.   Yes [provider]  aspirin 81 MG EC tablet Take 1 tablet (81 mg total) by mouth daily. Swallow whole. 02/17/22  Yes Patwardhan, Manish J, MD  Cholecalciferol (VITAMIN D3) 1000 units CAPS Take 1,000 Units by mouth daily.   Yes [provider]  feeding supplement (ENSURE ENLIVE / ENSURE PLUS) LIQD Take 237 mLs by mouth 2 (two) times daily between meals. 09/27/23  Yes Lanae Boast, MD  gabapentin (NEURONTIN) 800 MG  tablet Take 800 mg by mouth See admin instructions. Take 800 mg by mouth three times a day and an additional 800 mg once a day as needed for pain 02/01/22  Yes [provider]  HYDROcodone-acetaminophen (NORCO) 10-325 MG tablet Take 1 tablet by mouth See admin instructions. Take 1 tablet by mouth three times a day and an additional 1 tablet up to two times a day as needed for pain   Yes [provider]  metFORMIN (GLUCOPHAGE) 500 MG tablet Take 1 tablet (500 mg total) by mouth 2 (two) times daily with a meal. Resume on Sunday 02/19/2022 Patient taking differently: Take 500 mg by mouth 2 (two) times daily with a meal. 02/17/22  Yes Patwardhan, Manish J, MD  metoprolol succinate (TOPROL XL) 25 MG 24 hr tablet Take 1 tablet (25 mg total) by mouth daily. Hold for hr< 60 or sbp <120 09/27/23 10/27/23 Yes Kc, Dayna Barker, MD  rosuvastatin (CRESTOR) 40 MG tablet TAKE 1 TABLET BY MOUTH AT  BEDTIME Patient taking differently: Take 40 mg by mouth at bedtime. 07/07/22  Yes Tolia, Sunit, DO  tamsulosin (FLOMAX) 0.4 MG CAPS capsule Take 0.8 mg by mouth at bedtime.   Yes [provider]  ticagrelor (BRILINTA) 90 MG TABS tablet Take 1 tablet (90 mg total) by mouth 2 (two) times daily. 03/16/23 03/15/24 Yes Tolia, Sunit, DO  Zinc Sulfate 220 (50 Zn) MG TABS Take 1 tablet (220 mg total) by mouth daily. 09/28/23 10/28/23 Yes Lanae Boast, MD  albuterol (PROVENTIL) (2.5 MG/3ML) 0.083% nebulizer solution Inhale 3 mLs (2.5 mg total) by nebulization every 4 (four) hours as needed for wheezing or shortness of breath. Patient taking differently: Take 2.5 mg by nebulization 3 (three) times daily. 09/11/22   Jerre Simon, MD  albuterol (VENTOLIN HFA) 108 (90 Base) MCG/ACT inhaler INHALE 1 PUFF FOUR TIMES DAILY, AS NEEDED Patient taking differently: Inhale 2 puffs into the lungs every 6 (six) hours as needed for wheezing or shortness of breath. 10/26/21 09/13/23  Raspet, Noberto Retort, PA-C  ibuprofen (GOODSENSE IBUPROFEN) 200 MG tablet Take 800 mg by mouth every 6 (six) hours as needed (for pain).    [provider]  losartan (COZAAR) 25 MG tablet Take 2 tablets (50 mg total) by mouth daily. 10/04/23 11/03/23  LampteyBritta Mccreedy, MD  nitroGLYCERIN (NITROSTAT) 0.4 MG SL tablet PLACE 1 TABLET UNDER THE TONGUE EVERY 5 MINUTES AS NEEDED FOR CHEST PAIN Patient taking differently: Place 0.4 mg under the tongue every 5 (five) minutes as needed for chest pain. 03/01/23   Tolia, Sunit, DO  tiZANidine (ZANAFLEX) 4 MG tablet Take 4 mg by mouth every 8 (eight) hours as needed for muscle spasms. 02/15/23   [provider]  Dwyane Luo 100-62.5-25 MCG/ACT AEPB Take 1 puff by mouth daily as needed ("for flares"). 12/21/21   [provider]    Family History Family History  Problem Relation Age of Onset   Diabetes Mother    Hypertension Father    Lupus Sister     Social History Social History    Tobacco Use   Smoking status: Former    Types: Cigarettes    Start date: 1992    Quit date: 1972    Years since quitting: 52.9   Smokeless tobacco: Never   Tobacco comments:    08-08-2023  per pt quit smoking 1992,  started age 41,   Vaping Use   Vaping status: Never Used  Substance Use Topics   Alcohol use: Yes  Alcohol/week: 7.0 standard drinks of alcohol    Types: 7 Cans of beer per week    Comment: 08-08-2023  one beer per night ,  16 oz   Drug use: Not Currently    Types: Marijuana    Comment: 10/01/2013 "last drug use was 10 yr ago"     Allergies   Patient has no known allergies.   Review of Systems Review of Systems As per HPI  Physical Exam Triage Vital Signs ED Triage Vitals  Encounter Vitals Group     BP 10/04/23 1727 (!) 181/97     Systolic BP Percentile --      Diastolic BP Percentile --      Pulse Rate 10/04/23 1727 71     Resp 10/04/23 1727 20     Temp 10/04/23 1727 98.1 F (36.7 C)     Temp Source 10/04/23 1727 Oral     SpO2 10/04/23 1727 97 %     Weight --      Height --      Head Circumference --      Peak Flow --      Pain Score 10/04/23 1725 0     Pain Loc --      Pain Education --      Exclude from Growth Chart --    No data found.  Updated Vital Signs BP (!) 181/97 (BP Location: Right Arm)   Pulse 71   Temp 98.1 F (36.7 C) (Oral)   Resp 20   SpO2 97%   Visual Acuity Right Eye Distance:   Left Eye Distance:   Bilateral Distance:    Right Eye Near:   Left Eye Near:    Bilateral Near:     Physical Exam Vitals and nursing note reviewed.  Constitutional:      General: He is not in acute distress.    Appearance: Normal appearance. He is not ill-appearing.  Cardiovascular:     Rate and Rhythm: Normal rate and regular rhythm.     Pulses: Normal pulses.     Heart sounds: Normal heart sounds.  Pulmonary:     Effort: Pulmonary effort is normal.     Breath sounds: Normal breath sounds.  Abdominal:     General: Bowel  sounds are normal.     Palpations: Abdomen is soft.  Neurological:     Mental Status: He is alert.      UC Treatments / Results  Labs (all labs ordered are listed, but only abnormal results are displayed) Labs Reviewed - No data to display  EKG   Radiology No results found.  Procedures Procedures (including critical care time)  Medications Ordered in UC Medications - No data to display  Initial Impression / Assessment and Plan / UC Course  I have reviewed the triage vital signs and the nursing notes.  Pertinent labs & imaging results that were available during my care of the patient were reviewed by me and considered in my medical decision making (see chart for details).     1.  Uncontrolled hypertension: Increase losartan to 50 mg orally daily Continue metoprolol XL 25 mg orally daily Please follow-up with your physician for further medication adjustments if needed Please keep a log of your blood pressures and take it with you to your doctor's visit. Return precautions given Final Clinical Impressions(s) / UC Diagnoses   Final diagnoses:  Hypertension, uncontrolled     Discharge Instructions      I have increased your losartan  from 25 mg orally daily to 50 mg orally daily. Continue taking metoprolol XL 25 mg orally daily Please continue to increase your physical activity at home Keep your appointment with your primary care provider next week Please keep a log of your blood pressure readings and presented to your primary care physician when you see him/her in the office. Please feel free to return to urgent care if you have worsening symptoms.   ED Prescriptions     Medication Sig Dispense Auth. Provider   losartan (COZAAR) 25 MG tablet Take 2 tablets (50 mg total) by mouth daily. 30 tablet Nazanin Kinner, Britta Mccreedy, MD      PDMP not reviewed this encounter.   Merrilee Jansky, MD 10/06/23 [phone]

## 2023-10-08 ENCOUNTER — Ambulatory Visit: Payer: Medicare HMO | Admitting: Internal Medicine

## 2023-10-08 ENCOUNTER — Other Ambulatory Visit: Payer: Self-pay

## 2023-10-08 ENCOUNTER — Ambulatory Visit: Payer: Medicare HMO

## 2023-10-08 VITALS — BP 158/90 | HR 79 | Temp 97.5°F | Wt 193.6 lb

## 2023-10-08 DIAGNOSIS — N493 Fournier gangrene: Secondary | ICD-10-CM | POA: Diagnosis not present

## 2023-10-08 DIAGNOSIS — Z23 Encounter for immunization: Secondary | ICD-10-CM | POA: Diagnosis not present

## 2023-10-08 DIAGNOSIS — Z79899 Other long term (current) drug therapy: Secondary | ICD-10-CM

## 2023-10-08 NOTE — Progress Notes (Signed)
RFV: hospital follow up for fournier's gangrene  Patient ID: Evan Brock, male   DOB: 1951/04/18, 72 y.o.   MRN: [id number]  HPI Evan Brock is a 72yo M who is undergoing IMRT for prostate ca bu was hospitalized in late November through early December for perineal pain and imaging of abd/pelvis found abscess requiring debridement. He was receiving IV abtx and returned for partial closure.   His OR Cx grew e.faecalis, c.acnes, bacteroides species as well. He was discharged on amox/clav for which he is tolerating.   Outpatient Encounter Medications as of 10/08/2023  Medication Sig   albuterol (PROVENTIL) (2.5 MG/3ML) 0.083% nebulizer solution Inhale 3 mLs (2.5 mg total) by nebulization every 4 (four) hours as needed for wheezing or shortness of breath. (Patient taking differently: Take 2.5 mg by nebulization 3 (three) times daily.)   amoxicillin-clavulanate (AUGMENTIN) 875-125 MG tablet Take 1 tablet by mouth 2 (two) times daily with a meal for 18 days.   Ascorbic Acid (VITAMIN C) 1000 MG tablet Take 2,000 mg by mouth daily.   aspirin 81 MG EC tablet Take 1 tablet (81 mg total) by mouth daily. Swallow whole.   Cholecalciferol (VITAMIN D3) 1000 units CAPS Take 1,000 Units by mouth daily.   feeding supplement (ENSURE ENLIVE / ENSURE PLUS) LIQD Take 237 mLs by mouth 2 (two) times daily between meals.   gabapentin (NEURONTIN) 800 MG tablet Take 800 mg by mouth See admin instructions. Take 800 mg by mouth three times a day and an additional 800 mg once a day as needed for pain   HYDROcodone-acetaminophen (NORCO) 10-325 MG tablet Take 1 tablet by mouth See admin instructions. Take 1 tablet by mouth three times a day and an additional 1 tablet up to two times a day as needed for pain   ibuprofen (GOODSENSE IBUPROFEN) 200 MG tablet Take 800 mg by mouth every 6 (six) hours as needed (for pain).   losartan (COZAAR) 25 MG tablet Take 2 tablets (50 mg total) by mouth daily.   metFORMIN (GLUCOPHAGE) 500 MG tablet  Take 1 tablet (500 mg total) by mouth 2 (two) times daily with a meal. Resume on Sunday 02/19/2022 (Patient taking differently: Take 500 mg by mouth 2 (two) times daily with a meal.)   metoprolol succinate (TOPROL XL) 25 MG 24 hr tablet Take 1 tablet (25 mg total) by mouth daily. Hold for hr< 60 or sbp <120   nitroGLYCERIN (NITROSTAT) 0.4 MG SL tablet PLACE 1 TABLET UNDER THE TONGUE EVERY 5 MINUTES AS NEEDED FOR CHEST PAIN (Patient taking differently: Place 0.4 mg under the tongue every 5 (five) minutes as needed for chest pain.)   rosuvastatin (CRESTOR) 40 MG tablet TAKE 1 TABLET BY MOUTH AT BEDTIME (Patient taking differently: Take 40 mg by mouth at bedtime.)   tamsulosin (FLOMAX) 0.4 MG CAPS capsule Take 0.8 mg by mouth at bedtime.   ticagrelor (BRILINTA) 90 MG TABS tablet Take 1 tablet (90 mg total) by mouth 2 (two) times daily.   tiZANidine (ZANAFLEX) 4 MG tablet Take 4 mg by mouth every 8 (eight) hours as needed for muscle spasms.   TRELEGY ELLIPTA 100-62.5-25 MCG/ACT AEPB Take 1 puff by mouth daily as needed ("for flares").   Zinc Sulfate 220 (50 Zn) MG TABS Take 1 tablet (220 mg total) by mouth daily.   albuterol (VENTOLIN HFA) 108 (90 Base) MCG/ACT inhaler INHALE 1 PUFF FOUR TIMES DAILY, AS NEEDED (Patient taking differently: Inhale 2 puffs into the lungs every 6 (six) hours as needed  for wheezing or shortness of breath.)   No facility-administered encounter medications on file as of 10/08/2023.     Patient Active Problem List   Diagnosis Date Noted   Septic shock (HCC) 09/13/2023   Fournier's 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   Acute hypoxic respiratory failure (HCC) 09/08/2022   Status post coronary artery stent placement    S/P PTCA (percutaneous transluminal coronary angioplasty)    Non-insulin dependent type 2 diabetes mellitus (HCC)    Mixed hyperlipidemia    Coronary atherosclerosis due to calcified coronary lesion    Former smoker    Class  1 obesity due to excess calories with serious comorbidity and body mass index (BMI) of 31.0 to 31.9 in adult    Non-ST elevation (NSTEMI) myocardial infarction (HCC) 02/16/2022   NSTEMI (non-ST elevated myocardial infarction) (HCC) 02/16/2022   Acute respiratory failure with hypoxia (HCC) 11/12/2021   CAP (community acquired pneumonia) 02/16/2021   Hypoxia 02/16/2021   COPD with acute exacerbation (HCC) 02/16/2021   Type 2 diabetes mellitus with obesity (HCC) 02/16/2021   AKI (acute kidney injury) (HCC) 02/16/2021   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   Leukocytosis 10/04/2015   COPD (chronic obstructive pulmonary disease) (HCC) 10/01/2015   Essential hypertension, benign 03/01/2015   Asthma with acute exacerbation 01/26/2015   Thrombocytopenia, unspecified (HCC) 11/06/2013   Acute respiratory failure (HCC) with hypoxia 10/01/2013   Acute renal failure (HCC) 10/01/2013     Health Maintenance Due  Topic Date Due   Medicare Annual Wellness (AWV)  Never done   COVID-19 Vaccine (1) Never done   Diabetic kidney evaluation - Urine ACR  Never done   Zoster Vaccines- Shingrix (1 of 2) Never done   OPHTHALMOLOGY EXAM  06/22/2018   FOOT EXAM  02/03/2020   Fecal DNA (Cologuard)  08/11/2021   INFLUENZA VACCINE  05/24/2023     Review of Systems Mild pain at perineum. No candidal skin infection Physical Exam   BP (!) 154/88   Pulse 79   Temp (!) 97.5 F (36.4 C) (Oral)   Wt 193 lb 9.6 oz (87.8 kg)   SpO2 93%   BMI 28.59 kg/m    Gen= a xo by 3 in nad Gu = groin lesion showing good granulation tissue  CBC Lab Results  Component Value Date   WBC 7.9 09/26/2023   RBC 3.88 (L) 09/26/2023   HGB 10.1 (L) 09/26/2023   HCT 34.6 (L) 09/26/2023   PLT 312 09/26/2023   MCV 89.2 09/26/2023   MCH 26.0 09/26/2023   MCHC 29.2 (L) 09/26/2023   RDW 17.2 (H) 09/26/2023   LYMPHSABS 0.4 (L) 09/08/2023   MONOABS 0.7 09/08/2023   EOSABS 0.2 09/08/2023    BMET Lab Results  Component  Value Date   NA 141 09/26/2023   K 4.0 09/26/2023   CL 106 09/26/2023   CO2 25 09/26/2023   GLUCOSE 97 09/26/2023   BUN 11 09/26/2023   CREATININE 0.99 09/26/2023   CALCIUM 9.2 09/26/2023   GFRNONAA >60 09/26/2023   GFRAA 76 08/05/2019     Assessment and Plan  Fournier's gangrene = Continue amox.clav Continue iodophor tape for dressing. Daily   Long term medication management = cr recent labs are stable  Health maintenance= Flu vaccine today

## 2023-10-09 ENCOUNTER — Ambulatory Visit: Payer: Medicare HMO

## 2023-10-10 ENCOUNTER — Ambulatory Visit: Payer: Medicare HMO

## 2023-10-11 ENCOUNTER — Ambulatory Visit: Payer: Medicare HMO

## 2023-10-12 ENCOUNTER — Ambulatory Visit: Payer: Medicare HMO

## 2023-10-15 ENCOUNTER — Ambulatory Visit: Payer: Medicare HMO

## 2023-10-16 ENCOUNTER — Ambulatory Visit: Payer: Medicare HMO

## 2023-10-18 ENCOUNTER — Ambulatory Visit: Payer: Medicare HMO

## 2023-10-19 ENCOUNTER — Ambulatory Visit: Payer: Medicare HMO

## 2023-10-22 ENCOUNTER — Ambulatory Visit: Payer: Medicare HMO

## 2023-10-23 ENCOUNTER — Ambulatory Visit: Payer: Medicare HMO

## 2023-10-25 ENCOUNTER — Ambulatory Visit: Payer: Medicare HMO

## 2023-10-26 ENCOUNTER — Ambulatory Visit: Payer: Medicare HMO

## 2023-10-29 ENCOUNTER — Ambulatory Visit: Payer: Medicare HMO

## 2023-10-30 ENCOUNTER — Ambulatory Visit: Payer: Medicare HMO

## 2023-11-29 ENCOUNTER — Other Ambulatory Visit (HOSPITAL_COMMUNITY): Payer: Self-pay

## 2023-11-29 ENCOUNTER — Ambulatory Visit: Payer: Medicare PPO | Attending: Cardiology | Admitting: Cardiology

## 2023-11-29 VITALS — BP 120/88 | HR 91 | Resp 16 | Ht 69.0 in | Wt 195.6 lb

## 2023-11-29 DIAGNOSIS — E119 Type 2 diabetes mellitus without complications: Secondary | ICD-10-CM

## 2023-11-29 DIAGNOSIS — E782 Mixed hyperlipidemia: Secondary | ICD-10-CM

## 2023-11-29 DIAGNOSIS — I77810 Thoracic aortic ectasia: Secondary | ICD-10-CM

## 2023-11-29 DIAGNOSIS — I2510 Atherosclerotic heart disease of native coronary artery without angina pectoris: Secondary | ICD-10-CM

## 2023-11-29 DIAGNOSIS — I214 Non-ST elevation (NSTEMI) myocardial infarction: Secondary | ICD-10-CM

## 2023-11-29 DIAGNOSIS — I491 Atrial premature depolarization: Secondary | ICD-10-CM

## 2023-11-29 DIAGNOSIS — R911 Solitary pulmonary nodule: Secondary | ICD-10-CM

## 2023-11-29 DIAGNOSIS — I10 Essential (primary) hypertension: Secondary | ICD-10-CM | POA: Diagnosis not present

## 2023-11-29 DIAGNOSIS — Z955 Presence of coronary angioplasty implant and graft: Secondary | ICD-10-CM

## 2023-11-29 MED ORDER — METOPROLOL SUCCINATE ER 50 MG PO TB24
50.0000 mg | ORAL_TABLET | Freq: Every day | ORAL | 3 refills | Status: DC
  Filled 2023-11-29: qty 90, 90d supply, fill #0

## 2023-11-29 MED ORDER — CLOPIDOGREL BISULFATE 75 MG PO TABS: 75.0000 mg | ORAL_TABLET | Freq: Every day | ORAL | 3 refills | Status: DC

## 2023-11-29 MED ORDER — CLOPIDOGREL BISULFATE 75 MG PO TABS
75.0000 mg | ORAL_TABLET | Freq: Every day | ORAL | 3 refills | Status: DC
  Filled 2023-11-29: qty 90, 90d supply, fill #0

## 2023-11-29 MED ORDER — METOPROLOL SUCCINATE ER 50 MG PO TB24: 50.0000 mg | ORAL_TABLET | Freq: Every day | ORAL | 3 refills | Status: AC

## 2023-11-29 NOTE — Progress Notes (Signed)
 Cardiology Office Note:  .   Date:  11/29/2023  ID:  Evan Brock, DOB 11-Dec-1950, MRN [id number] PCP:  Evan Emmy POUR, NP  Former Cardiology Providers: NA proximal [facility] Providers Cardiologist:  Evan Large, DO , [facility] (established care February 16, 2022 ) Electrophysiologist:  None  Click to update primary MD,subspecialty MD or APP then REFRESH:1}    Chief Complaint  Patient presents with   Atherosclerosis of native coronary artery of native heart w   Follow-up    History of Present Illness: .   Evan Brock is a 73 y.o. 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   In April 2023 patient ruled in for NSTEMI and was noted to have obstructive disease based on angiography and underwent aspiration thrombectomy status post PTCA and stenting to the LCx.  During the hospitalization he was noted to have mildly dilated ascending aorta at 41 mm per echo cardiogram.  Since then we have been working on improving his modifiable cardiovascular risk factors.  Since last office visit patient did have a CTA of the chest to evaluate the progression of ascending aortic dilatation.  Overall dimensions of the ascending aorta remained stable.  However, he was noted to have an incidental finding of a new irregular subpleural pulmonary nodule opacity involving the left lower lobe measuring 1.9 cm.  He was supposed to have a repeat CT scan in 3 months.  But this is still pending.  Patient states that he follows up with Dr. Gene at [facility] and has an appointment with them on December 24, 2023.  Since last office visit patient was hospitalized for  Fournier gangrene and septic  shock discharge summary reviewed. Clinically denies anginal chest pain or heart failure symptoms.  Review of Systems: .   Review of Systems  Cardiovascular:  Negative for chest pain, claudication, dyspnea on exertion, leg swelling, near-syncope, orthopnea, palpitations, paroxysmal nocturnal dyspnea and syncope.  Respiratory:  Negative for shortness of breath.   Hematologic/Lymphatic: Negative for bleeding problem.    Studies Reviewed:   EKG: EKG Interpretation Date/Time:  Thursday November 29 2023 10:08:32 EST Ventricular Rate:  82 PR Interval:  172 QRS Duration:  84 QT Interval:  362 QTC Calculation: 422 R Axis:   50  Text Interpretation: Sinus rhythm with Premature atrial complexes When compared with ECG of 13-Sep-2023 11:08, Premature atrial complexes new compared to prior study. Confirmed by Brock Evan [phone]) on 11/29/2023 10:10:56 AM  Echocardiogram: 09/14/2023  1. Left ventricular ejection fraction, by estimation, is 60 to 65%. The left ventricle has normal function. The left ventricle has no regional wall motion abnormalities. There is mild concentric left ventricular hypertrophy. Left ventricular diastolic parameters are indeterminate.   2. Right ventricular systolic function is normal. The right ventricular size is mildly enlarged. There is severely elevated pulmonary artery  systolic pressure. The estimated right ventricular systolic pressure is 63.7 mmHg.   3. Right atrial size was mildly dilated.   4. The mitral valve was not well visualized. Mild to moderate mitral valve regurgitation. No evidence of mitral stenosis.   5. Tricuspid valve regurgitation is mild to moderate.   6. The aortic valve is tricuspid. There is mild calcification of the aortic valve. Aortic valve regurgitation is  not visualized. Aortic valve sclerosis/calcification is present, without any evidence of aortic stenosis.   7. Aortic dilatation noted. There is mild dilatation of the ascending aorta,  measuring 41 mm.   8. The inferior vena cava is dilated in size with >50% respiratory  variability, suggesting right atrial pressure of 8 mmHg.   Heart Catheterization: 02/16/2022 LM: Normal LAD: No significant disease RCA: Prox and distal 30% disease Lcx: Brock vessel with prox occlusion with organized heavy thrombus (Culprit)   Successful percutaneous coronary intervention prox Lcx     Aspiration thrombectomy     PTCA and stent placement 4.0 X 16 mm Synergy drug-eluting stent     Post dilatation with 4.0X8 mm [place] balloon up to 22 atm   100%--->0% residual stenosis TIMI flow 0-->II   Distal embolization due to late presentation.  Recommend aggrastat  and nitro drip overnight.   Difficult procedure due to difficulty wiring tortuous Lcx vessel with organized thrombus requiring multiple wires and use of angled support catheter  RADIOLOGY: CT chest without contrast May 23, 2023 1. Mildly dilated ascending thoracic aorta measuring 4.1 cm, unchanged when compared with prior. Recommend annual imaging followup by CTA or MRA. This recommendation follows 2010 ACCF/AHA/AATS/ACR/ASA/SCA/SCAI/SIR/STS/SVM Guidelines for the Diagnosis and Management of Patients with Thoracic Aortic Disease. Circulation. 2010; 121: Z733-z630. Aortic aneurysm NOS (ICD10-I71.9) 2. New irregular subpleural nodular opacity of the left lower lobe measuring 1.9 cm, could be postinfectious, although pulmonary nodule is also a concern. Short-term follow-up chest CT is recommended in 3 months to ensure resolution. 3. Unchanged lower lung predominant cylindrical bronchiectasis with associated scattered areas of mucous plugging and small right-greater-than-left lower lobe predominant pulmonary nodules, likely sequela of infection or recurrent aspiration. 4. Coronary artery calcifications and aortic Atherosclerosis  Risk Assessment/Calculations:   N/A   Labs:       Latest Ref Rng & Units 09/26/2023    9:51 AM 09/24/2023     4:59 AM 09/23/2023    5:01 AM  CBC  WBC 4.0 - 10.5 K/uL 7.9  6.9  6.8   Hemoglobin 13.0 - 17.0 g/dL 89.8  9.5  8.8   Hematocrit 39.0 - 52.0 % 34.6  31.9  28.6   Platelets 150 - 400 K/uL 312  254  205        Latest Ref Rng & Units 09/26/2023    9:51 AM 09/24/2023    4:59 AM 09/23/2023    5:01 AM  BMP  Glucose 70 - 99 mg/dL 97  898  891   BUN 8 - 23 mg/dL 11  15  13    Creatinine 0.61 - 1.24 mg/dL 9.00  9.06  9.11   Sodium 135 - 145 mmol/L 141  142  142   Potassium 3.5 - 5.1 mmol/L 4.0  3.5  3.0   Chloride 98 - 111 mmol/L 106  107  107   CO2 22 - 32 mmol/L 25  28  29    Calcium  8.9 - 10.3 mg/dL 9.2  8.9  8.7       Latest Ref Rng & Units 09/26/2023    9:51 AM 09/24/2023    4:59 AM 09/23/2023    5:01 AM  CMP  Glucose 70 - 99 mg/dL 97  898  891   BUN 8 - 23 mg/dL 11  15  13    Creatinine 0.61 - 1.24 mg/dL 9.00  9.06  9.11   Sodium 135 - 145 mmol/L 141  142  142   Potassium 3.5 - 5.1 mmol/L  4.0  3.5  3.0   Chloride 98 - 111 mmol/L 106  107  107   CO2 22 - 32 mmol/L 25  28  29    Calcium  8.9 - 10.3 mg/dL 9.2  8.9  8.7     Lab Results  Component Value Date   CHOL 111 04/30/2023   HDL 46 04/30/2023   LDLCALC 53 04/30/2023   LDLDIRECT 50 04/30/2023   TRIG 436 (H) 09/15/2023   CHOLHDL 4.2 02/17/2022   No results for input(s): LIPOA in the last 8760 hours. No components found for: NTPROBNP No results for input(s): PROBNP in the last 8760 hours. No results for input(s): TSH in the last 8760 hours.   Physical Exam:    Today's Vitals   11/29/23 1004  BP: 120/88  Pulse: 91  Resp: 16  SpO2: 96%  Weight: 195 lb 9.6 oz (88.7 kg)  Height: 5' 9 (1.753 m)   Body mass index is 28.89 kg/m. Wt Readings from Last 3 Encounters:  11/29/23 195 lb 9.6 oz (88.7 kg)  10/08/23 193 lb 9.6 oz (87.8 kg)  09/27/23 201 lb 8 oz (91.4 kg)    Physical Exam  Constitutional: No distress.  Age appropriate, hemodynamically stable.   Neck: No JVD present.  Cardiovascular: Normal rate,  regular rhythm, S1 normal, S2 normal, intact distal pulses and normal pulses. Exam reveals no gallop, no S3 and no S4.  No murmur heard. Pulmonary/Chest: Effort normal and breath sounds normal. No stridor. He has no wheezes. He has no rales.  Abdominal: Soft. Bowel sounds are normal. He exhibits no distension. There is no abdominal tenderness.  Musculoskeletal:        General: No edema.     Cervical back: Neck supple.  Neurological: He is alert and oriented to person, place, and time. He has intact cranial nerves (2-12).  Skin: Skin is warm and moist.   Impression & Recommendation(s):  Impression:   ICD-10-CM   1. Atherosclerosis of native coronary artery of native heart without angina pectoris  I25.10 EKG 12-Lead    clopidogrel  (PLAVIX ) 75 MG tablet    DISCONTINUED: clopidogrel  (PLAVIX ) 75 MG tablet    2. Status post coronary artery stent placement  Z95.5     3. Non-ST elevation (NSTEMI) myocardial infarction (HCC)  I21.4     4. Essential hypertension, benign  I10     5. Ascending aorta dilatation (HCC)  I77.810     6. PAC (premature atrial contraction)  I49.1     7. Non-insulin  dependent type 2 diabetes mellitus (HCC)  E11.9     8. Mixed hyperlipidemia  E78.2     9. Pulmonary nodule  R91.1 CT CHEST WO CONTRAST       Recommendation(s):  Atherosclerosis of native coronary artery of native heart without angina pectoris Status post coronary artery stent placement History of Non-ST elevation (NSTEMI) myocardial infarction [facility]) Index event February 16, 2022 Underwent aspiration thrombectomy followed by PTCA and PCI to the culprit LCx.  Has completed greater than 1 year of dual antiplatelet therapy. Will discontinue aspirin  81 mg p.o. daily. Will discontinue Brilinta  90 mg p.o. twice daily. Start Plavix  75 mg p.o. daily. Continue Crestor  40 mg p.o. nightly Denies anginal chest pain or heart failure symptoms. No use of sublingual nitroglycerin  tablets since the last office  visit. Reemphasized the importance of secondary prevention with focus on improving her modifiable cardiovascular risk factors such as glycemic control, lipid management, blood pressure control, weight loss.  Essential hypertension, benign  Home and office blood pressures are well-controlled. Continue losartan  100 mg p.o. daily. Increase Toprol -XL to 50 mg p.o. daily Reemphasized importance of low-salt diet  Ascending aorta dilatation (HCC) CT of the chest in July 20 24-41 mm ascending aorta Office and home blood pressures are well-controlled. Continue losartan  100 mg p.o. daily. Will increase Toprol -XL to 50 mg p.o. daily Patient is advised to avoid antibiotics such as fluoroquinolones Patient is advised to avoid lifting heavy objects that increases abdominal pressures  PAC (premature atrial contraction) EKG today illustrates sinus rhythm with PACs. Will increase Toprol -XL to 50 mg p.o. daily which should overall help both PAC burden and his history of aortic dilatation  Non-insulin  dependent type 2 diabetes mellitus (HCC) Currently on metformin , ARB, statin Reemphasize importance of glycemic control  Mixed hyperlipidemia Currently on Crestor  40 mg p.o. daily.   He denies myalgia or other side effects. Most recent lipids dated July 2024, independently reviewed as noted above.  LDL is 50 mg/dL.   Pulmonary nodule Had a CT of the chest without contrast in July 2024 and was noted to have a irregular subpleural nodular opacity in the left lower lobe measuring 1.9 cm. Patient was informed to have a repeat CT chest in 3 months-but this is still pending He does follow-up with pulmonary medicine with [facility] medical He used to work as a doctor, general practice. To make sure that he follows through we will order a CT of the chest for completeness that have asked him to discuss results with his pulmonologist his upcoming appointment in March 2025   Orders Placed:  Orders Placed This Encounter   Procedures   CT CHEST WO CONTRAST    Needs before 12/24/23    Standing Status:   Future    Expected Date:   12/13/2023    Expiration Date:   11/28/2024    Scheduling Instructions:     Needs before 12/24/23    Preferred imaging location?:   [facility]   EKG 12-Lead    Final Medication List:    Meds ordered this encounter  Medications   DISCONTD: metoprolol  succinate (TOPROL  XL) 50 MG 24 hr tablet    Sig: Take 1 tablet (50 mg total) by mouth daily. Hold for hr< 60 or sbp <120    Dispense:  90 tablet    Refill:  3   DISCONTD: clopidogrel  (PLAVIX ) 75 MG tablet    Sig: Take 1 tablet (75 mg total) by mouth daily.    Dispense:  90 tablet    Refill:  3   clopidogrel  (PLAVIX ) 75 MG tablet    Sig: Take 1 tablet (75 mg total) by mouth daily.    Dispense:  90 tablet    Refill:  3   metoprolol  succinate (TOPROL  XL) 50 MG 24 hr tablet    Sig: Take 1 tablet (50 mg total) by mouth daily. Hold for hr< 60 or sbp <120    Dispense:  90 tablet    Refill:  3    Increased from 25 mg to 50 mg    Medications Discontinued During This Encounter  Medication Reason   losartan  (COZAAR ) 25 MG tablet Dose change   aspirin  81 MG EC tablet Discontinued by provider   ticagrelor  (BRILINTA ) 90 MG TABS tablet Discontinued by provider   metoprolol  succinate (TOPROL  XL) 25 MG 24 hr tablet Reorder   metoprolol  succinate (TOPROL  XL) 50 MG 24 hr tablet    clopidogrel  (PLAVIX ) 75 MG tablet  Current Outpatient Medications:    albuterol  (PROVENTIL ) (2.5 MG/3ML) 0.083% nebulizer solution, Inhale 3 mLs (2.5 mg total) by nebulization every 4 (four) hours as needed for wheezing or shortness of breath. (Patient taking differently: Take 2.5 mg by nebulization 3 (three) times daily.), Disp: 90 mL, Rfl: 0   albuterol  (VENTOLIN  HFA) 108 (90 Base) MCG/ACT inhaler, INHALE 1 PUFF FOUR TIMES DAILY, AS NEEDED (Patient taking differently: Inhale 2 puffs into the lungs every 6 (six) hours as needed for wheezing or shortness  of breath.), Disp: 8 g, Rfl: 0   Ascorbic Acid  (VITAMIN C ) 1000 MG tablet, Take 2,000 mg by mouth daily., Disp: , Rfl:    Cholecalciferol (VITAMIN D3) 1000 units CAPS, Take 1,000 Units by mouth daily., Disp: , Rfl:    feeding supplement (ENSURE ENLIVE / ENSURE PLUS) LIQD, Take 237 mLs by mouth 2 (two) times daily between meals., Disp: 237 mL, Rfl: 12   gabapentin  (NEURONTIN ) 800 MG tablet, Take 800 mg by mouth See admin instructions. Take 800 mg by mouth three times a day and an additional 800 mg once a day as needed for pain, Disp: , Rfl:    HYDROcodone -acetaminophen  (NORCO) 10-325 MG tablet, Take 1 tablet by mouth See admin instructions. Take 1 tablet by mouth three times a day and an additional 1 tablet up to two times a day as needed for pain, Disp: , Rfl:    ibuprofen (GOODSENSE IBUPROFEN) 200 MG tablet, Take 800 mg by mouth every 6 (six) hours as needed (for pain)., Disp: , Rfl:    losartan  (COZAAR ) 100 MG tablet, Take 100 mg by mouth daily., Disp: , Rfl:    metFORMIN  (GLUCOPHAGE ) 500 MG tablet, Take 1 tablet (500 mg total) by mouth 2 (two) times daily with a meal. Resume on Sunday 02/19/2022 (Patient taking differently: Take 500 mg by mouth 2 (two) times daily with a meal.), Disp: 180 tablet, Rfl: 1   nitroGLYCERIN  (NITROSTAT ) 0.4 MG SL tablet, PLACE 1 TABLET UNDER THE TONGUE EVERY 5 MINUTES AS NEEDED FOR CHEST PAIN (Patient taking differently: Place 0.4 mg under the tongue every 5 (five) minutes as needed for chest pain.), Disp: 25 tablet, Rfl: 3   rosuvastatin  (CRESTOR ) 40 MG tablet, TAKE 1 TABLET BY MOUTH AT BEDTIME (Patient taking differently: Take 40 mg by mouth at bedtime.), Disp: 90 tablet, Rfl: 0   tamsulosin  (FLOMAX ) 0.4 MG CAPS capsule, Take 0.8 mg by mouth at bedtime., Disp: , Rfl:    tiZANidine  (ZANAFLEX ) 4 MG tablet, Take 4 mg by mouth every 8 (eight) hours as needed for muscle spasms., Disp: , Rfl:    TRELEGY ELLIPTA 100-62.5-25 MCG/ACT AEPB, Take 1 puff by mouth daily as needed  (for flares)., Disp: , Rfl:    clopidogrel  (PLAVIX ) 75 MG tablet, Take 1 tablet (75 mg total) by mouth daily., Disp: 90 tablet, Rfl: 3   metoprolol  succinate (TOPROL  XL) 50 MG 24 hr tablet, Take 1 tablet (50 mg total) by mouth daily. Hold for hr< 60 or sbp <120, Disp: 90 tablet, Rfl: 3  Consent:   NA  Disposition:   6 months follow-up sooner if needed  His questions and concerns were addressed to his satisfaction. He voices understanding of the recommendations provided during this encounter.    Signed, Evan Large, DO, [facility]  [facility]  [address] [place], [place] [address] 12/01/2023 8:46 PM

## 2023-11-29 NOTE — Patient Instructions (Addendum)
 Medication Instructions:  STOP Aspirin   STOP Brilinta   INCREASED Metoprolol  25 mg to 50 MG take one tablet daily  START Plavix  75 mg take one tablet daily   *If you need a refill on your cardiac medications before your next appointment, please call your pharmacy*   Testing/Procedures: CT chest   Non-Cardiac CT scanning, (CAT scanning), is a noninvasive, special x-ray that produces cross-sectional images of the body using x-rays and a computer. CT scans help physicians diagnose and treat medical conditions. For some CT exams, a contrast material is used to enhance visibility in the area of the body being studied. CT scans provide greater clarity and reveal more details than regular x-ray exams.    Follow-Up: At [facility], you and your health needs are our priority.  As part of our continuing mission to provide you with exceptional heart care, we have created designated Provider Care Teams.  These Care Teams include your primary Cardiologist (physician) and Advanced Practice Providers (APPs -  Physician Assistants and Nurse Practitioners) who all work together to provide you with the care you need, when you need it.  Your next appointment:   6 month(s)  Provider:   Dr. Michele   Other Instructions   1st Floor: - Lobby - Registration  - Pharmacy  - Lab - Cafe  2nd Floor: - PV Lab - Diagnostic Testing (echo, CT, nuclear med)  3rd Floor: - Vacant  4th Floor: - TCTS (cardiothoracic surgery) - AFib Clinic - Structural Heart Clinic - Vascular Surgery  - Vascular Ultrasound  5th Floor: - HeartCare Cardiology (general and EP) - Clinical Pharmacy for coumadin, hypertension, lipid, weight-loss medications, and med management appointments    Valet parking services will be available as well.

## 2023-12-01 ENCOUNTER — Encounter: Payer: Self-pay | Admitting: Cardiology

## 2023-12-03 ENCOUNTER — Other Ambulatory Visit (HOSPITAL_COMMUNITY): Payer: Self-pay

## 2023-12-05 ENCOUNTER — Ambulatory Visit (HOSPITAL_COMMUNITY)
Admission: RE | Admit: 2023-12-05 | Discharge: 2023-12-05 | Disposition: A | Discharge status: Home / Self Care | Payer: Medicare PPO | Source: Ambulatory Visit | Attending: Cardiology | Admitting: Cardiology

## 2023-12-05 DIAGNOSIS — R911 Solitary pulmonary nodule: Secondary | ICD-10-CM | POA: Diagnosis present

## 2023-12-11 ENCOUNTER — Telehealth: Payer: Self-pay

## 2023-12-11 NOTE — Telephone Encounter (Signed)
Spoke with Herbert Seta at [facility] that stated they had received the scan we routed to their office and have sent them to Dr. Bethanie Dicker.

## 2023-12-11 NOTE — Telephone Encounter (Signed)
-----   Message from Nurse Zelphia Cairo sent at 12/08/2023 10:53 PM EST ----- Regarding: F/U pulm nodules Call pulm MD office (Dr. Bethanie Dicker) and make sure they received this pt's CT scan from 2024 and the most recent one. Document name of who I end up speaking with.

## 2024-01-09 ENCOUNTER — Encounter (HOSPITAL_BASED_OUTPATIENT_CLINIC_OR_DEPARTMENT_OTHER): Payer: Self-pay | Admitting: Pulmonary Disease

## 2024-02-20 ENCOUNTER — Inpatient Hospital Stay: Admitting: Pulmonary Disease

## 2024-03-17 ENCOUNTER — Other Ambulatory Visit: Payer: Self-pay | Admitting: Cardiology

## 2024-03-17 DIAGNOSIS — Z955 Presence of coronary angioplasty implant and graft: Secondary | ICD-10-CM

## 2024-03-17 DIAGNOSIS — I214 Non-ST elevation (NSTEMI) myocardial infarction: Secondary | ICD-10-CM

## 2024-03-17 DIAGNOSIS — I2510 Atherosclerotic heart disease of native coronary artery without angina pectoris: Secondary | ICD-10-CM

## 2024-03-20 ENCOUNTER — Ambulatory Visit: Admitting: Pulmonary Disease

## 2024-03-20 ENCOUNTER — Encounter: Payer: Self-pay | Admitting: Pulmonary Disease

## 2024-03-20 VITALS — BP 149/83 | HR 70 | Ht 69.0 in | Wt 209.2 lb

## 2024-03-20 DIAGNOSIS — J479 Bronchiectasis, uncomplicated: Secondary | ICD-10-CM | POA: Diagnosis not present

## 2024-03-20 DIAGNOSIS — Z87891 Personal history of nicotine dependence: Secondary | ICD-10-CM | POA: Diagnosis not present

## 2024-03-20 NOTE — Progress Notes (Signed)
 Evan Brock    [id number]    1951-06-01  Primary Care Physician:No primary care provider on file.  Referring Physician: Cherylin Corrigan, NP [address] [place],  [place] [address]  Chief complaint:   Patient being seen for bronchiectasis  HPI:  Recently had a CT scan in February 2025 showing evidence of bronchiectasis  Has been following up with a physician at [facility] medical Recently had a CT scan but we do not have the results of the CT scan at present  Denies any significant coughing at present Does get short of breath with moderate activity especially walking uphill  On albuterol , Trelegy -Compliant with inhalers  Does have a background history of chronic obstructive pulmonary disease  Was recently treated for Fournier's gangrene, history of prostate cancer  He had been on multiple inhalers in the past for COPD/asthma including Breo, Breztri   Minimal smoking in the past, quit in 1992   Outpatient Encounter Medications as of 03/20/2024  Medication Sig   albuterol  (PROVENTIL ) (2.5 MG/3ML) 0.083% nebulizer solution Inhale 3 mLs (2.5 mg total) by nebulization every 4 (four) hours as needed for wheezing or shortness of breath. (Patient taking differently: Take 2.5 mg by nebulization 3 (three) times daily.)   Ascorbic Acid  (VITAMIN C ) 1000 MG tablet Take 2,000 mg by mouth daily.   Cholecalciferol (VITAMIN D3) 1000 units CAPS Take 1,000 Units by mouth daily.   clopidogrel  (PLAVIX ) 75 MG tablet Take 1 tablet (75 mg total) by mouth daily.   feeding supplement (ENSURE ENLIVE / ENSURE PLUS) LIQD Take 237 mLs by mouth 2 (two) times daily between meals.   gabapentin  (NEURONTIN ) 800 MG tablet Take 800 mg by mouth See admin instructions. Take 800 mg by mouth three times a day and an additional 800 mg once a day as needed for pain   HYDROcodone -acetaminophen  (NORCO) 10-325 MG tablet Take 1 tablet by mouth See admin instructions. Take 1 tablet by mouth three  times a day and an additional 1 tablet up to two times a day as needed for pain   ibuprofen (GOODSENSE IBUPROFEN) 200 MG tablet Take 800 mg by mouth every 6 (six) hours as needed (for pain).   losartan  (COZAAR ) 100 MG tablet Take 100 mg by mouth daily.   metFORMIN  (GLUCOPHAGE ) 500 MG tablet Take 1 tablet (500 mg total) by mouth 2 (two) times daily with a meal. Resume on Sunday 02/19/2022 (Patient taking differently: Take 500 mg by mouth 2 (two) times daily with a meal.)   metoprolol  succinate (TOPROL  XL) 50 MG 24 hr tablet Take 1 tablet (50 mg total) by mouth daily. Hold for hr< 60 or sbp <120   nitroGLYCERIN  (NITROSTAT ) 0.4 MG SL tablet PLACE 1 TABELT UNDER THE TONGUE EVERY 5 MINUTES AS NEEDED FOR CHEST PAIN   rosuvastatin  (CRESTOR ) 40 MG tablet TAKE 1 TABLET BY MOUTH AT BEDTIME (Patient taking differently: Take 40 mg by mouth at bedtime.)   tamsulosin  (FLOMAX ) 0.4 MG CAPS capsule Take 0.8 mg by mouth at bedtime.   tiZANidine  (ZANAFLEX ) 4 MG tablet Take 4 mg by mouth every 8 (eight) hours as needed for muscle spasms.   TRELEGY ELLIPTA 100-62.5-25 MCG/ACT AEPB Take 1 puff by mouth daily as needed ("for flares").   albuterol  (VENTOLIN  HFA) 108 (90 Base) MCG/ACT inhaler INHALE 1 PUFF FOUR TIMES DAILY, AS NEEDED (Patient taking differently: Inhale 2 puffs into the lungs every 6 (six) hours as needed for wheezing or shortness  of breath.)   No facility-administered encounter medications on file as of 03/20/2024.    Allergies as of 03/20/2024   (No Known Allergies)    Past Medical History:  Diagnosis Date   Anticoagulant long-term use    brilinta / asa--- managed by cardiology   Benign localized prostatic hyperplasia with lower urinary tract symptoms (LUTS)    Chronic constipation    OTC laxative prn   Chronic pain syndrome    pain management--- dr foster [facility])   neck and back   COPD (chronic obstructive pulmonary disease) (HCC)    followed by pcp ---  not on oxygen  (previous seen by  pulmonology-- dr byrum--- lov in epic 07-12-2020);   last exacerbation [facility]-23-2023 [facility]  pneumonia/ hypoxia w/ O2 82% refused ED,  then ED visit 05-17-2023 hypoxia / treated w/ nebs, antibiotics;  back at urgent care 06-06-2023  exacerbation / pneumonia/ +COVID,  O2 90% treated w/ nebs/ prednisone / antibiotics   Coronary artery disease 01/2022   cardiologist--- dr Jaylene Metz;  NSTEMI  s/p cath 02-16-2022  thrombectomy / PTCA w/ DES to LCX;   DDD (degenerative disc disease), cervical    DDD (degenerative disc disease), lumbosacral    ED (erectile dysfunction)    Exertional shortness of breath    08-08-2023   unable to do stairs or yard word, but can do household chores ok   History of acute respiratory failure    several times w/ hypoxia   History of asbestos exposure    History of non-ST elevation myocardial infarction (NSTEMI) 02/16/2022   Hyperlipidemia, mixed    Hypertension    Malignant neoplasm prostate [facility]) 12/2022   urologist-- dr pace/  radiation oncologist--- dr Lorri Rota;   dx 03/ 2024, gleason 4+5   Moderate persistent asthma    OA (osteoarthritis)    S/P drug eluting coronary stent placement 02/17/2023   x1 to LCx   Type 2 diabetes mellitus (HCC)    followed by pcp   (08-08-2023  per pt checks blood sugar one a month)   Wears partial dentures    upper only    Past Surgical History:  Procedure Laterality Date   CORONARY STENT INTERVENTION N/A 02/16/2022   Procedure: CORONARY STENT INTERVENTION;  Surgeon: Cody Das, MD;  Location: [facility];  Service: Cardiovascular;  Laterality: N/A;   CORONARY THROMBECTOMY N/A 02/16/2022   Procedure: Coronary Thrombectomy;  Surgeon: Cody Das, MD;  Location: [facility];  Service: Cardiovascular;  Laterality: N/A;   GOLD SEED IMPLANT N/A 08/14/2023   Procedure: GOLD SEED IMPLANT;  Surgeon: Roxane Copp, MD;  [facility];  Service: Urology;  Laterality: N/A;  30 MINUTES    INCISION AND DRAINAGE ABSCESS N/A 09/13/2023   Procedure: INCISION AND DRAINAGE ABSCESS;  Surgeon: Adelbert Homans, MD;  Location: [facility];  Service: Urology;  Laterality: N/A;   LEFT HEART CATH AND CORONARY ANGIOGRAPHY N/A 02/16/2022   Procedure: LEFT HEART CATH AND CORONARY ANGIOGRAPHY;  Surgeon: Cody Das, MD;  Location: [facility];  Service: Cardiovascular;  Laterality: N/A;   SCROTAL EXPLORATION N/A 09/14/2023   Procedure: PERINEAL IRRIGATION AND DEBRIDEMENT, WOUND DRESSING CHANGE;  Surgeon: Roxane Copp, MD;  Location: [facility];  Service: Urology;  Laterality: N/A;   SCROTAL EXPLORATION N/A 09/25/2023   Procedure: PERINEAL WASHOUT AND CLOSURE, SCROTAL EXPLORATION, AND REPACKING OF ABSCESS CAVITY;  Surgeon: Roxane Copp, MD;  Location: [facility];  Service: Urology;  Laterality:  N/A;   SPACE OAR INSTILLATION N/A 08/14/2023   Procedure: SPACE OAR INSTILLATION;  Surgeon: Roxane Copp, MD;  Location: [facility];  Service: Urology;  Laterality: N/A;   TOTAL HIP ARTHROPLASTY Right 01/11/2009   @[facility]  by dr France Ina   TOTAL HIP ARTHROPLASTY Left 08/19/2013   Procedure: TOTAL HIP ARTHROPLASTY ANTERIOR APPROACH;  Surgeon: Alphonzo Ask, MD;  Location: [facility];  Service: Orthopedics;  Laterality: Left;    Family History  Problem Relation Age of Onset   Diabetes Mother    Hypertension Father    Lupus Sister     Social History   Socioeconomic History   Marital status: Married    Spouse name: Not on file   Number of children: 0   Years of education: Not on file   Highest education level: Not on file  Occupational History   Not on file  Tobacco Use   Smoking status: Former    Types: Cigarettes    Start date: 69    Quit date: 1972    Years since quitting: 53.4   Smokeless tobacco: Never   Tobacco comments:    08-08-2023  per pt quit smoking 1992,  started age 34,   Vaping Use   Vaping status: Never Used  Substance and Sexual  Activity   Alcohol use: Yes    Alcohol/week: 7.0 standard drinks of alcohol    Types: 7 Cans of beer per week    Comment: 08-08-2023  one beer per night ,  16 oz   Drug use: Not Currently    Types: Marijuana    Comment: 10/01/2013 "last drug use was 10 yr ago"   Sexual activity: Yes  Other Topics Concern   Not on file  Social History Narrative   Not on file   Social Drivers of Health   Financial Resource Strain: Not on file  Food Insecurity: No Food Insecurity (09/14/2023)   Hunger Vital Sign    Worried About Running Out of Food in the Last Year: Never true    Ran Out of Food in the Last Year: Never true  Transportation Needs: No Transportation Needs (09/14/2023)   PRAPARE - Administrator, Civil Service (Medical): No    Lack of Transportation (Non-Medical): No  Physical Activity: Not on file  Stress: Not on file  Social Connections: Not on file  Intimate Partner Violence: Not At Risk (09/14/2023)   Humiliation, Afraid, Rape, and Kick questionnaire    Fear of Current or Ex-Partner: No    Emotionally Abused: No    Physically Abused: No    Sexually Abused: No    Review of Systems  Respiratory:  Positive for shortness of breath.     Vitals:   03/20/24 1006  BP: (!) 149/83  Pulse: 70  SpO2: 91%     Physical Exam Constitutional:      Appearance: He is obese.  HENT:     Head: Normocephalic.     Mouth/Throat:     Mouth: Mucous membranes are moist.  Eyes:     General: No scleral icterus. Cardiovascular:     Rate and Rhythm: Normal rate and regular rhythm.     Heart sounds: No murmur heard.    No friction rub.  Pulmonary:     Effort: No respiratory distress.     Breath sounds: No stridor. No wheezing or rhonchi.     Comments: Decreased air movement bilaterally Musculoskeletal:     Cervical back: No rigidity  or tenderness.  Neurological:     Mental Status: He is alert.  Psychiatric:        Mood and Affect: Mood normal.    Data  Reviewed: Pulmonary function test was reviewed with the patient and compared with previous showing severe obstructive disease  Most recent CT scan 12/05/2023 reviewed with the patient showing evidence of bronchiectasis  Patient had a recent CT at [facility] medical CT not available ESAs he does have a copy of the CT  Assessment:  Bronchiectasis - Does not appear to have an exacerbation at present - Recent CT not available for comparison  History of chronic obstructive pulmonary disease - On Trelegy, albuterol    Abnormal pulmonary function test showing severe obstructive disease - Last one was about 3 years ago will need repeat    Plan/Recommendations: Encouraged to continue bronchodilator treatments  Graded activities as tolerated  Encouraged to give us  a call if any significant exacerbation of symptoms - May benefit from hypertonic saline, flutter device for secretion clearance - Does not appear to have significant secretions at present  Not having any symptoms to suggest on exacerbation at present as well  Schedule for pulmonary function test  I encouraged him to get us  a copy of his most recent CT to compare  Tentative follow-up in 3 months   Myer Artis MD [facility] 03/20/2024, 10:12 AM  CC: Patel, Seema K, NP

## 2024-03-20 NOTE — Patient Instructions (Signed)
 I will see you back in about 3 months  If you can, get us  a copy of the most recent CAT scan to compare with your previous  Give us  a call if any significant changes in symptoms  Sometimes people with bronchiectasis will have more secretions with difficulty clearing the secretions  Management of secretions is very important-cough expectorants like Mucinex , nebulizer treatments with hypertonic saline, flutter device that can help break up secretions and expectorate can be helpful  We will schedule you for breathing study to compare with the previous  Continue using your current inhalers  Make sure you are exercising and staying active on a regular basis

## 2024-04-07 ENCOUNTER — Ambulatory Visit (INDEPENDENT_AMBULATORY_CARE_PROVIDER_SITE_OTHER)

## 2024-04-07 ENCOUNTER — Ambulatory Visit (HOSPITAL_COMMUNITY)
Admission: EM | Admit: 2024-04-07 | Discharge: 2024-04-07 | Disposition: A | Discharge status: Home / Self Care | Attending: Internal Medicine | Admitting: Internal Medicine

## 2024-04-07 ENCOUNTER — Encounter (HOSPITAL_COMMUNITY): Payer: Self-pay

## 2024-04-07 DIAGNOSIS — R0602 Shortness of breath: Secondary | ICD-10-CM

## 2024-04-07 DIAGNOSIS — I10 Essential (primary) hypertension: Secondary | ICD-10-CM | POA: Diagnosis not present

## 2024-04-07 DIAGNOSIS — J441 Chronic obstructive pulmonary disease with (acute) exacerbation: Secondary | ICD-10-CM

## 2024-04-07 LAB — POC SARS CORONAVIRUS 2 AG -  ED: SARS Coronavirus 2 Ag: NEGATIVE

## 2024-04-07 MED ORDER — IPRATROPIUM-ALBUTEROL 0.5-2.5 (3) MG/3ML IN SOLN
RESPIRATORY_TRACT | Status: AC
Start: 2024-04-07 — End: 2024-04-07
  Filled 2024-04-07: qty 3

## 2024-04-07 MED ORDER — ALBUTEROL SULFATE HFA 108 (90 BASE) MCG/ACT IN AERS: 2.0000 | INHALATION_SPRAY | RESPIRATORY_TRACT | 0 refills | Status: DC | PRN

## 2024-04-07 MED ORDER — METHYLPREDNISOLONE SODIUM SUCC 125 MG IJ SOLR
INTRAMUSCULAR | Status: AC
  Filled 2024-04-07: qty 2

## 2024-04-07 MED ORDER — ALBUTEROL SULFATE (2.5 MG/3ML) 0.083% IN NEBU
2.5000 mg | INHALATION_SOLUTION | Freq: Once | RESPIRATORY_TRACT | Status: AC
  Administered 2024-04-07: 2.5 mg via RESPIRATORY_TRACT

## 2024-04-07 MED ORDER — ALBUTEROL SULFATE (2.5 MG/3ML) 0.083% IN NEBU: 2.5000 mg | INHALATION_SOLUTION | Freq: Four times a day (QID) | RESPIRATORY_TRACT | 12 refills | Status: DC | PRN

## 2024-04-07 MED ORDER — METHYLPREDNISOLONE SODIUM SUCC 125 MG IJ SOLR
80.0000 mg | Freq: Once | INTRAMUSCULAR | Status: AC
  Administered 2024-04-07: 80 mg via INTRAMUSCULAR

## 2024-04-07 MED ORDER — ALBUTEROL SULFATE (2.5 MG/3ML) 0.083% IN NEBU
INHALATION_SOLUTION | RESPIRATORY_TRACT | Status: AC
  Filled 2024-04-07: qty 3

## 2024-04-07 MED ORDER — IPRATROPIUM-ALBUTEROL 0.5-2.5 (3) MG/3ML IN SOLN
3.0000 mL | Freq: Once | RESPIRATORY_TRACT | Status: AC
  Administered 2024-04-07: 3 mL via RESPIRATORY_TRACT

## 2024-04-07 MED ORDER — PREDNISONE 20 MG PO TABS: 40.0000 mg | ORAL_TABLET | Freq: Every day | ORAL | 0 refills | Status: AC

## 2024-04-07 NOTE — ED Triage Notes (Signed)
 Pt c/o SOB x4 days. Hx of COPD. States worse on exertion. States using his inhaler with relief.

## 2024-04-07 NOTE — ED Provider Notes (Signed)
 [facility]    CSN: [id number] Arrival date & time: 04/07/24  4098      History   Chief Complaint Chief Complaint  Patient presents with   Shortness of Breath    HPI Evan Brock is a 73 y.o. male.   Patient presents to urgent care for evaluation of shortness of breath, cough, and bilateral chest tightness that started 3 days ago.  Additionally reports dry cough without sputum production.  No fever, chills, nausea, vomiting, rash, leg swelling, orthopnea, dizziness, or recent antibiotic use.  History of COPD and asthma, former smoker, quit 40 years ago.  He has been using his nebulizer machine and rescue inhaler at home with some relief.  He has not used any albuterol  today.  Additionally uses Trelegy inhaler daily as prescribed by pulmonology.  Denies recent steroid use in the last 90 days.  History of type 2 diabetes, unable to view recent hemoglobin A1c on file (receives medical care at [facility] and unable to access these records in Care Everywhere).  Takes Plavix  and Brilinta  for CAD without missed doses.  States symptoms feel like a typical COPD/asthma exacerbation.   Shortness of Breath   Past Medical History:  Diagnosis Date   Anticoagulant long-term use    brilinta / asa--- managed by cardiology   Benign localized prostatic hyperplasia with lower urinary tract symptoms (LUTS)    Chronic constipation    OTC laxative prn   Chronic pain syndrome    pain management--- dr foster [facility])   neck and back   COPD (chronic obstructive pulmonary disease) (HCC)    followed by pcp ---  not on oxygen  (previous seen by pulmonology-- dr byrum--- lov in epic 07-12-2020);   last exacerbation 04-23-2023 [facility]  pneumonia/ hypoxia w/ O2 82% refused ED,  then ED visit 05-17-2023 hypoxia / treated w/ nebs, antibiotics;  back at urgent care 06-06-2023  exacerbation / pneumonia/ +COVID,  O2 90% treated w/ nebs/ prednisone / antibiotics   Coronary artery  disease 01/2022   cardiologist--- dr Jaylene Metz;  NSTEMI  s/p cath 02-16-2022  thrombectomy / PTCA w/ DES to LCX;   DDD (degenerative disc disease), cervical    DDD (degenerative disc disease), lumbosacral    ED (erectile dysfunction)    Exertional shortness of breath    08-08-2023   unable to do stairs or yard word, but can do household chores ok   History of acute respiratory failure    several times w/ hypoxia   History of asbestos exposure    History of non-ST elevation myocardial infarction (NSTEMI) 02/16/2022   Hyperlipidemia, mixed    Hypertension    Malignant neoplasm prostate [facility]) 12/2022   urologist-- dr pace/  radiation oncologist--- dr Lorri Rota;   dx 03/ 2024, gleason 4+5   Moderate persistent asthma    OA (osteoarthritis)    S/P drug eluting coronary stent placement 02/17/2023   x1 to LCx   Type 2 diabetes mellitus (HCC)    followed by pcp   (08-08-2023  per pt checks blood sugar one a month)   Wears partial dentures    upper only    Patient Active Problem List   Diagnosis Date Noted   Septic shock (HCC) 09/13/2023   Fournier's 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   Acute hypoxic respiratory failure (HCC) 09/08/2022   Status post coronary artery stent placement    S/P PTCA (percutaneous transluminal coronary angioplasty)    Non-insulin  dependent type 2  diabetes mellitus (HCC)    Mixed hyperlipidemia    Coronary atherosclerosis due to calcified coronary lesion    Former smoker    Class 1 obesity due to excess calories with serious comorbidity and body mass index (BMI) of 31.0 to 31.9 in adult    Non-ST elevation (NSTEMI) myocardial infarction (HCC) 02/16/2022   NSTEMI (non-ST elevated myocardial infarction) (HCC) 02/16/2022   Acute respiratory failure with hypoxia (HCC) 11/12/2021   CAP (community acquired pneumonia) 02/16/2021   Hypoxia 02/16/2021   COPD with acute exacerbation (HCC) 02/16/2021   Type 2 diabetes mellitus with  obesity (HCC) 02/16/2021   AKI (acute kidney injury) (HCC) 02/16/2021   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   Leukocytosis 10/04/2015   COPD (chronic obstructive pulmonary disease) (HCC) 10/01/2015   Essential hypertension, benign 03/01/2015   Asthma with acute exacerbation 01/26/2015   Thrombocytopenia, unspecified (HCC) 11/06/2013   Acute respiratory failure (HCC) with hypoxia 10/01/2013   Acute renal failure (HCC) 10/01/2013    Past Surgical History:  Procedure Laterality Date   CORONARY STENT INTERVENTION N/A 02/16/2022   Procedure: CORONARY STENT INTERVENTION;  Surgeon: Cody Das, MD;  [facility];  Service: Cardiovascular;  Laterality: N/A;   CORONARY THROMBECTOMY N/A 02/16/2022   Procedure: Coronary Thrombectomy;  Surgeon: Cody Das, MD;  [facility];  Service: Cardiovascular;  Laterality: N/A;   GOLD SEED IMPLANT N/A 08/14/2023   Procedure: GOLD SEED IMPLANT;  Surgeon: Roxane Copp, MD;  Location: [facility];  Service: Urology;  Laterality: N/A;  30 MINUTES   INCISION AND DRAINAGE ABSCESS N/A 09/13/2023   Procedure: INCISION AND DRAINAGE ABSCESS;  Surgeon: Adelbert Homans, MD;  Location: [facility];  Service: Urology;  Laterality: N/A;   LEFT HEART CATH AND CORONARY ANGIOGRAPHY N/A 02/16/2022   Procedure: LEFT HEART CATH AND CORONARY ANGIOGRAPHY;  Surgeon: Cody Das, MD;  [facility];  Service: Cardiovascular;  Laterality: N/A;   SCROTAL EXPLORATION N/A 09/14/2023   Procedure: PERINEAL IRRIGATION AND DEBRIDEMENT, WOUND DRESSING CHANGE;  Surgeon: Roxane Copp, MD;  Location: [facility];  Service: Urology;  Laterality: N/A;   SCROTAL EXPLORATION N/A 09/25/2023   Procedure: PERINEAL WASHOUT AND CLOSURE, SCROTAL EXPLORATION, AND REPACKING OF ABSCESS CAVITY;  Surgeon: Roxane Copp, MD;  Location: [facility];  Service: Urology;  Laterality: N/A;   SPACE OAR INSTILLATION  N/A 08/14/2023   Procedure: SPACE OAR INSTILLATION;  Surgeon: Roxane Copp, MD;  Location: [facility];  Service: Urology;  Laterality: N/A;   TOTAL HIP ARTHROPLASTY Right 01/11/2009   @[facility]  by dr France Ina   TOTAL HIP ARTHROPLASTY Left 08/19/2013   Procedure: TOTAL HIP ARTHROPLASTY ANTERIOR APPROACH;  Surgeon: Alphonzo Ask, MD;  Location: [facility];  Service: Orthopedics;  Laterality: Left;       Home Medications    Prior to Admission medications   Medication Sig Start Date End Date Taking? Authorizing Provider  albuterol  (PROVENTIL ) (2.5 MG/3ML) 0.083% nebulizer solution Take 3 mLs (2.5 mg total) by nebulization every 6 (six) hours as needed for wheezing or shortness of breath. 04/07/24  Yes Starlene Eaton, FNP  albuterol  (VENTOLIN  HFA) 108 (90 Base) MCG/ACT inhaler Inhale 2 puffs into the lungs every 4 (four) hours as needed for wheezing or shortness of breath. 04/07/24  Yes Starlene Eaton, FNP  predniSONE  (DELTASONE ) 20 MG tablet Take 2 tablets (40 mg total) by mouth daily with breakfast for 5 days. 04/07/24  04/12/24 Yes Starlene Eaton, FNP  Ascorbic Acid  (VITAMIN C ) 1000 MG tablet Take 2,000 mg by mouth daily.    [provider]  Cholecalciferol (VITAMIN D3) 1000 units CAPS Take 1,000 Units by mouth daily.    [provider]  clopidogrel  (PLAVIX ) 75 MG tablet Take 1 tablet (75 mg total) by mouth daily. 11/29/23   Tolia, Sunit, DO  feeding supplement (ENSURE ENLIVE / ENSURE PLUS) LIQD Take 237 mLs by mouth 2 (two) times daily between meals. 09/27/23   Lesa Rape, MD  gabapentin  (NEURONTIN ) 800 MG tablet Take 800 mg by mouth See admin instructions. Take 800 mg by mouth three times a day and an additional 800 mg once a day as needed for pain 02/01/22   [provider]  HYDROcodone -acetaminophen  (NORCO) 10-325 MG tablet Take 1 tablet by mouth See admin instructions. Take 1 tablet by mouth three times a day and an additional 1 tablet up  to two times a day as needed for pain    [provider]  ibuprofen (GOODSENSE IBUPROFEN) 200 MG tablet Take 800 mg by mouth every 6 (six) hours as needed (for pain).    [provider]  losartan  (COZAAR ) 100 MG tablet Take 100 mg by mouth daily. 11/06/23   [provider]  metFORMIN  (GLUCOPHAGE ) 500 MG tablet Take 1 tablet (500 mg total) by mouth 2 (two) times daily with a meal. Resume on Sunday 02/19/2022 Patient taking differently: Take 500 mg by mouth 2 (two) times daily with a meal. 02/17/22   Patwardhan, Manish J, MD  metoprolol  succinate (TOPROL  XL) 50 MG 24 hr tablet Take 1 tablet (50 mg total) by mouth daily. Hold for hr< 60 or sbp <120 11/29/23   Tolia, Sunit, DO  nitroGLYCERIN  (NITROSTAT ) 0.4 MG SL tablet PLACE 1 TABELT UNDER THE TONGUE EVERY 5 MINUTES AS NEEDED FOR CHEST PAIN 03/19/24   Tolia, Sunit, DO  rosuvastatin  (CRESTOR ) 40 MG tablet TAKE 1 TABLET BY MOUTH AT BEDTIME Patient taking differently: Take 40 mg by mouth at bedtime. 07/07/22   Tolia, Sunit, DO  tamsulosin  (FLOMAX ) 0.4 MG CAPS capsule Take 0.8 mg by mouth at bedtime.    [provider]  tiZANidine  (ZANAFLEX ) 4 MG tablet Take 4 mg by mouth every 8 (eight) hours as needed for muscle spasms. 02/15/23   [provider]  TRELEGY ELLIPTA 100-62.5-25 MCG/ACT AEPB Take 1 puff by mouth daily as needed (for flares). 12/21/21   [provider]    Family History Family History  Problem Relation Age of Onset   Diabetes Mother    Hypertension Father    Lupus Sister     Social History Social History   Tobacco Use   Smoking status: Former    Types: Cigarettes    Start date: 1992    Quit date: 1972    Years since quitting: 53.4   Smokeless tobacco: Never   Tobacco comments:    08-08-2023  per pt quit smoking 1992,  started age 73,   Vaping Use   Vaping status: Never Used  Substance Use Topics   Alcohol use: Yes    Alcohol/week: 7.0 standard drinks of alcohol    Types: 7  Cans of beer per week    Comment: 08-08-2023  one beer per night ,  16 oz   Drug use: Not Currently    Types: Marijuana    Comment: 10/01/2013 last drug use was 10 yr ago     Allergies   Patient  has no known allergies.   Review of Systems Review of Systems  Respiratory:  Positive for shortness of breath.   Per HPI   Physical Exam Triage Vital Signs ED Triage Vitals  Encounter Vitals Group     BP 04/07/24 0919 (!) 160/101     Girls Systolic BP Percentile --      Girls Diastolic BP Percentile --      Boys Systolic BP Percentile --      Boys Diastolic BP Percentile --      Pulse Rate 04/07/24 0919 84     Resp 04/07/24 0919 (!) 24     Temp 04/07/24 0919 98.2 F (36.8 C)     Temp Source 04/07/24 0919 Oral     SpO2 04/07/24 0919 93 %     Weight --      Height --      Head Circumference --      Peak Flow --      Pain Score 04/07/24 0921 0     Pain Loc --      Pain Education --      Exclude from Growth Chart --    No data found.  Updated Vital Signs BP (!) 160/101 (BP Location: Right Arm)   Pulse 84   Temp 98.2 F (36.8 C) (Oral)   Resp (!) 24   SpO2 93%   Visual Acuity Right Eye Distance:   Left Eye Distance:   Bilateral Distance:    Right Eye Near:   Left Eye Near:    Bilateral Near:     Physical Exam Vitals and nursing note reviewed.  Constitutional:      Appearance: He is ill-appearing. He is not toxic-appearing.     Interventions: He is not intubated. HENT:     Head: Normocephalic and atraumatic.     Right Ear: Hearing, tympanic membrane, ear canal and external ear normal.     Left Ear: Hearing, tympanic membrane, ear canal and external ear normal.     Nose: Nose normal.     Mouth/Throat:     Lips: Pink.     Mouth: Mucous membranes are moist. No injury or oral lesions.     Dentition: Normal dentition.     Tongue: No lesions.     Pharynx: Oropharynx is clear. Uvula midline. No pharyngeal swelling, oropharyngeal exudate, posterior oropharyngeal  erythema, uvula swelling or postnasal drip.     Tonsils: No tonsillar exudate.   Eyes:     General: Lids are normal. Vision grossly intact. Gaze aligned appropriately.     Extraocular Movements: Extraocular movements intact.     Conjunctiva/sclera: Conjunctivae normal.   Neck:     Trachea: Trachea and phonation normal.   Cardiovascular:     Rate and Rhythm: Normal rate and regular rhythm.     Heart sounds: Normal heart sounds, S1 normal and S2 normal.  Pulmonary:     Effort: Tachypnea present. No bradypnea, accessory muscle usage or respiratory distress. He is not intubated.     Breath sounds: Normal air entry. No stridor. Decreased breath sounds present. No wheezing, rhonchi or rales.     Comments: Audible expiratory wheezing heard without stethoscope.  Speaking in short sentences with moderately increased respiratory effort.  Musculoskeletal:     Cervical back: Neck supple.     Right lower leg: No edema.     Left lower leg: No edema.  Lymphadenopathy:     Cervical: No cervical adenopathy.   Skin:  General: Skin is warm and dry.     Capillary Refill: Capillary refill takes less than 2 seconds.     Findings: No rash.   Neurological:     General: No focal deficit present.     Mental Status: He is alert and oriented to person, place, and time. Mental status is at baseline.     Cranial Nerves: No dysarthria or facial asymmetry.   Psychiatric:        Mood and Affect: Mood normal.        Speech: Speech normal.        Behavior: Behavior normal.        Thought Content: Thought content normal.        Judgment: Judgment normal.      UC Treatments / Results  Labs (all labs ordered are listed, but only abnormal results are displayed) Labs Reviewed  POC SARS CORONAVIRUS 2 AG -  ED    EKG   Radiology DG Chest 2 View Result Date: 04/07/2024 CLINICAL DATA:  Cough and congestion EXAM: CHEST - 2 VIEW COMPARISON:  September 14, 2023 FINDINGS: Subtle basilar infiltrates  without consolidations. No pleural effusion Heart and mediastinum normal IMPRESSION: Subtle basilar infiltrates could correlate with congestive changes Electronically Signed   By: Fredrich Jefferson M.D.   On: 04/07/2024 10:24    Procedures Procedures (including critical care time)  Medications Ordered in UC Medications  ipratropium-albuterol  (DUONEB) 0.5-2.5 (3) MG/3ML nebulizer solution 3 mL (3 mLs Nebulization Given 04/07/24 0931)  albuterol  (PROVENTIL ) (2.5 MG/3ML) 0.083% nebulizer solution 2.5 mg (2.5 mg Nebulization Given 04/07/24 0931)  methylPREDNISolone  sodium succinate (SOLU-MEDROL ) 125 mg/2 mL injection 80 mg (80 mg Intramuscular Given 04/07/24 0931)    Initial Impression / Assessment and Plan / UC Course  I have reviewed the triage vital signs and the nursing notes.  Pertinent labs & imaging results that were available during my care of the patient were reviewed by me and considered in my medical decision making (see chart for details).   1.  COPD exacerbation, shortness of breath, elevated blood pressure reading in office with diagnosis of hypertension Evaluation suggests COPD exacerbation.  Will treat with steroid, bronchodilator, cough suppressants for symptomatic relief, and expectorants (mucinex ) as needed.  DuoNeb and albuterol  breathing treatments given in clinic with significant improvement in lung sounds and subjective shortness of breath on reassessment. Solu-Medrol  80 mg IM given. Imaging: Chest x-ray shows subtle basilar infiltrates that radiologist states could correlate with congestive changes.  On review of chart, patient has chronic bilateral atelectasis on CT chest.  No signs of bacterial infection today on exam including purulent sputum and fever, etc.  Therefore, we will defer use of antibiotic today. Strep/viral testing: POC COVID negative. Discussed PCP follow-up to discuss management of COPD further with PCP/pulmonologist.   Oxygen  95% on room air and respiratory  rate 18 prior to discharge.   Counseled patient on potential for adverse effects with medications prescribed/recommended today, strict ER and return-to-clinic precautions discussed, patient verbalized understanding.    Final Clinical Impressions(s) / UC Diagnoses   Final diagnoses:  COPD exacerbation (HCC)  SOB (shortness of breath)  Elevated blood pressure reading in office with diagnosis of hypertension     Discharge Instructions      Your symptoms are due to COPD exacerbation. COVID test is negative.  - Use albuterol  every 4-6 hours as needed for cough, shortness of breath, and wheeze. - Take the steroid sent to the pharmacy as directed to help reduce  lung inflammation and decrease the risk of another attack in the next few days. No NSAIDs like ibuprofen or aleve /naproxen  while taking steroid. Take with food to avoid stomach upset.  If your symptoms do not improve in the next 2-3 days with interventions, please return. Please seek medical care for new or returning symptoms, such as difficulty breathing that doesn't improve with your medications, chest pain, voice changes, high fevers, confusion, or other new or worsening symptoms. Follow-up with PCP for ongoing management of asthma.     ED Prescriptions     Medication Sig Dispense Auth. Provider   predniSONE  (DELTASONE ) 20 MG tablet Take 2 tablets (40 mg total) by mouth daily with breakfast for 5 days. 10 tablet Shella Devoid M, FNP   albuterol  (PROVENTIL ) (2.5 MG/3ML) 0.083% nebulizer solution Take 3 mLs (2.5 mg total) by nebulization every 6 (six) hours as needed for wheezing or shortness of breath. 75 mL Shella Devoid M, FNP   albuterol  (VENTOLIN  HFA) 108 (90 Base) MCG/ACT inhaler Inhale 2 puffs into the lungs every 4 (four) hours as needed for wheezing or shortness of breath. 18 g Starlene Eaton, FNP      PDMP not reviewed this encounter.   Starlene Eaton, [place] 04/07/24 1037

## 2024-04-07 NOTE — Discharge Instructions (Addendum)
 Your symptoms are due to COPD exacerbation. COVID test is negative.  - Use albuterol  every 4-6 hours as needed for cough, shortness of breath, and wheeze. - Take the steroid sent to the pharmacy as directed to help reduce lung inflammation and decrease the risk of another attack in the next few days. No NSAIDs like ibuprofen or aleve /naproxen  while taking steroid. Take with food to avoid stomach upset.  If your symptoms do not improve in the next 2-3 days with interventions, please return. Please seek medical care for new or returning symptoms, such as difficulty breathing that doesn't improve with your medications, chest pain, voice changes, high fevers, confusion, or other new or worsening symptoms. Follow-up with PCP for ongoing management of asthma.

## 2024-05-20 ENCOUNTER — Other Ambulatory Visit: Payer: Self-pay | Admitting: Cardiology

## 2024-06-11 ENCOUNTER — Ambulatory Visit (INDEPENDENT_AMBULATORY_CARE_PROVIDER_SITE_OTHER): Admitting: Pulmonary Disease

## 2024-06-11 VITALS — BP 130/79 | HR 63 | Ht 68.0 in | Wt 215.0 lb

## 2024-06-11 DIAGNOSIS — Z87891 Personal history of nicotine dependence: Secondary | ICD-10-CM | POA: Diagnosis not present

## 2024-06-11 DIAGNOSIS — J449 Chronic obstructive pulmonary disease, unspecified: Secondary | ICD-10-CM | POA: Diagnosis not present

## 2024-06-11 DIAGNOSIS — J479 Bronchiectasis, uncomplicated: Secondary | ICD-10-CM

## 2024-06-11 LAB — PULMONARY FUNCTION TEST
DL/VA % pred: 104 %
DL/VA: 4.21 ml/min/mmHg/L
DLCO cor % pred: 59 %
DLCO cor: 14.06 ml/min/mmHg
DLCO unc % pred: 59 %
DLCO unc: 14.06 ml/min/mmHg
FEF 25-75 Post: 0.48 L/s
FEF 25-75 Pre: 0.45 L/s
FEF2575-%Change-Post: 8 %
FEF2575-%Pred-Post: 23 %
FEF2575-%Pred-Pre: 21 %
FEV1-%Change-Post: 3 %
FEV1-%Pred-Post: 38 %
FEV1-%Pred-Pre: 37 %
FEV1-Post: 1.1 L
FEV1-Pre: 1.07 L
FEV1FVC-%Change-Post: 2 %
FEV1FVC-%Pred-Pre: 75 %
FEV6-%Change-Post: 2 %
FEV6-%Pred-Post: 52 %
FEV6-%Pred-Pre: 51 %
FEV6-Post: 1.94 L
FEV6-Pre: 1.9 L
FEV6FVC-%Change-Post: 2 %
FEV6FVC-%Pred-Post: 106 %
FEV6FVC-%Pred-Pre: 104 %
FVC-%Change-Post: 0 %
FVC-%Pred-Post: 49 %
FVC-%Pred-Pre: 49 %
FVC-Post: 1.95 L
FVC-Pre: 1.95 L
Post FEV1/FVC ratio: 57 %
Post FEV6/FVC ratio: 99 %
Pre FEV1/FVC ratio: 55 %
Pre FEV6/FVC Ratio: 97 %
RV % pred: 212 %
RV: 5.14 L
TLC % pred: 111 %
TLC: 7.4 L

## 2024-06-11 MED ORDER — ALBUTEROL SULFATE (2.5 MG/3ML) 0.083% IN NEBU: 2.5000 mg | INHALATION_SOLUTION | Freq: Four times a day (QID) | RESPIRATORY_TRACT | 12 refills | Status: DC | PRN

## 2024-06-11 NOTE — Progress Notes (Signed)
 Full PFT performed today.

## 2024-06-11 NOTE — Progress Notes (Unsigned)
 Evan Brock    [id number]    05-01-1951  Primary Care Physician:Patient, No Pcp Per  Referring Physician: Neda Jennet LABOR, MD [address] [place],  [place] [address]  Chief complaint:   Patient being seen for bronchiectasis In for follow-up  HPI:  Patient with a history of bronchiectasis  Has been relatively stable No significant shortness of breath with mild to moderate exertion, does get short of breath with significant exertion Occasional cough, not really bringing up any secretions   Compliant with current inhalers including Trelegy and albuterol  as needed Recently had a CT scan in February 2025 showing evidence of bronchiectasis  Has been following up with a physician at [facility] medical Recently had a CT scan but we do not have the results of the CT scan at present  Denies any significant coughing at present Does get short of breath with moderate activity especially walking uphill  History of chronic obstructive pulmonary disease  Was recently treated for Fournier's gangrene, history of prostate cancer  He had been on multiple inhalers in the past for COPD/asthma including Breo, Breztri   Minimal smoking in the past, quit in 1992   Outpatient Encounter Medications as of 06/11/2024  Medication Sig   albuterol  (VENTOLIN  HFA) 108 (90 Base) MCG/ACT inhaler Inhale 2 puffs into the lungs every 4 (four) hours as needed for wheezing or shortness of breath.   Ascorbic Acid  (VITAMIN C ) 1000 MG tablet Take 2,000 mg by mouth daily.   Cholecalciferol (VITAMIN D3) 1000 units CAPS Take 1,000 Units by mouth daily.   clopidogrel  (PLAVIX ) 75 MG tablet Take 1 tablet (75 mg total) by mouth daily.   feeding supplement (ENSURE ENLIVE / ENSURE PLUS) LIQD Take 237 mLs by mouth 2 (two) times daily between meals.   gabapentin  (NEURONTIN ) 800 MG tablet Take 800 mg by mouth See admin instructions. Take 800 mg by mouth three times a day and an additional 800 mg once a  day as needed for pain   HYDROcodone -acetaminophen  (NORCO) 10-325 MG tablet Take 1 tablet by mouth See admin instructions. Take 1 tablet by mouth three times a day and an additional 1 tablet up to two times a day as needed for pain   ibuprofen (GOODSENSE IBUPROFEN) 200 MG tablet Take 800 mg by mouth every 6 (six) hours as needed (for pain).   losartan  (COZAAR ) 100 MG tablet TAKE 1 TABLET(100 MG) BY MOUTH DAILY   metFORMIN  (GLUCOPHAGE ) 500 MG tablet Take 1 tablet (500 mg total) by mouth 2 (two) times daily with a meal. Resume on Sunday 02/19/2022 (Patient taking differently: Take 500 mg by mouth 2 (two) times daily with a meal.)   metoprolol  succinate (TOPROL  XL) 50 MG 24 hr tablet Take 1 tablet (50 mg total) by mouth daily. Hold for hr< 60 or sbp <120   nitroGLYCERIN  (NITROSTAT ) 0.4 MG SL tablet PLACE 1 TABELT UNDER THE TONGUE EVERY 5 MINUTES AS NEEDED FOR CHEST PAIN   rosuvastatin  (CRESTOR ) 40 MG tablet TAKE 1 TABLET BY MOUTH AT BEDTIME (Patient taking differently: Take 40 mg by mouth at bedtime.)   tamsulosin  (FLOMAX ) 0.4 MG CAPS capsule Take 0.8 mg by mouth at bedtime.   tiZANidine  (ZANAFLEX ) 4 MG tablet Take 4 mg by mouth every 8 (eight) hours as needed for muscle spasms.   TRELEGY ELLIPTA 100-62.5-25 MCG/ACT AEPB Take 1 puff by mouth daily as needed (for flares).   [DISCONTINUED] albuterol  (PROVENTIL ) (2.5 MG/3ML) 0.083% nebulizer solution  Take 3 mLs (2.5 mg total) by nebulization every 6 (six) hours as needed for wheezing or shortness of breath.   albuterol  (PROVENTIL ) (2.5 MG/3ML) 0.083% nebulizer solution Take 3 mLs (2.5 mg total) by nebulization every 6 (six) hours as needed for wheezing or shortness of breath.   No facility-administered encounter medications on file as of 06/11/2024.    Allergies as of 06/11/2024   (No Known Allergies)    Past Medical History:  Diagnosis Date   Anticoagulant long-term use    brilinta / asa--- managed by cardiology   Benign localized prostatic  hyperplasia with lower urinary tract symptoms (LUTS)    Chronic constipation    OTC laxative prn   Chronic pain syndrome    pain management--- dr foster [facility])   neck and back   COPD (chronic obstructive pulmonary disease) (HCC)    followed by pcp ---  not on oxygen  (previous seen by pulmonology-- dr byrum--- lov in epic 07-12-2020);   last exacerbation [facility]-23-2023 [facility]  pneumonia/ hypoxia w/ O2 82% refused ED,  then ED visit 05-17-2023 hypoxia / treated w/ nebs, antibiotics;  back at urgent care 06-06-2023  exacerbation / pneumonia/ +COVID,  O2 90% treated w/ nebs/ prednisone / antibiotics   Coronary artery disease 01/2022   cardiologist--- dr osie;  NSTEMI  s/p cath 02-16-2022  thrombectomy / PTCA w/ DES to LCX;   DDD (degenerative disc disease), cervical    DDD (degenerative disc disease), lumbosacral    ED (erectile dysfunction)    Exertional shortness of breath    08-08-2023   unable to do stairs or yard word, but can do household chores ok   History of acute respiratory failure    several times w/ hypoxia   History of asbestos exposure    History of non-ST elevation myocardial infarction (NSTEMI) 02/16/2022   Hyperlipidemia, mixed    Hypertension    Malignant neoplasm prostate [facility]) 12/2022   urologist-- dr pace/  radiation oncologist--- dr patrcia;   dx 03/ 2024, gleason 4+5   Moderate persistent asthma    OA (osteoarthritis)    S/P drug eluting coronary stent placement 02/17/2023   x1 to LCx   Type 2 diabetes mellitus (HCC)    followed by pcp   (08-08-2023  per pt checks blood sugar one a month)   Wears partial dentures    upper only    Past Surgical History:  Procedure Laterality Date   CORONARY STENT INTERVENTION N/A 02/16/2022   Procedure: CORONARY STENT INTERVENTION;  Surgeon: Elmira Newman PARAS, MD;  Location: [facility];  Service: Cardiovascular;  Laterality: N/A;   CORONARY THROMBECTOMY N/A 02/16/2022   Procedure: Coronary Thrombectomy;   Surgeon: Elmira Newman PARAS, MD;  Location: [facility];  Service: Cardiovascular;  Laterality: N/A;   GOLD SEED IMPLANT N/A 08/14/2023   Procedure: GOLD SEED IMPLANT;  Surgeon: Elisabeth Valli BIRCH, MD;  Location: [facility];  Service: Urology;  Laterality: N/A;  30 MINUTES   INCISION AND DRAINAGE ABSCESS N/A 09/13/2023   Procedure: INCISION AND DRAINAGE ABSCESS;  Surgeon: Devere Lonni Righter, MD;  Location: [facility];  Service: Urology;  Laterality: N/A;   LEFT HEART CATH AND CORONARY ANGIOGRAPHY N/A 02/16/2022   Procedure: LEFT HEART CATH AND CORONARY ANGIOGRAPHY;  Surgeon: Elmira Newman PARAS, MD;  Location: [facility];  Service: Cardiovascular;  Laterality: N/A;   SCROTAL EXPLORATION N/A 09/14/2023   Procedure: PERINEAL IRRIGATION AND DEBRIDEMENT, WOUND DRESSING CHANGE;  Surgeon: Pace, Maryellen  D, MD;  Location: [facility];  Service: Urology;  Laterality: N/A;   SCROTAL EXPLORATION N/A 09/25/2023   Procedure: PERINEAL WASHOUT AND CLOSURE, SCROTAL EXPLORATION, AND REPACKING OF ABSCESS CAVITY;  Surgeon: Elisabeth Valli BIRCH, MD;  Location: [facility];  Service: Urology;  Laterality: N/A;   SPACE OAR INSTILLATION N/A 08/14/2023   Procedure: SPACE OAR INSTILLATION;  Surgeon: Elisabeth Valli BIRCH, MD;  Location: [facility];  Service: Urology;  Laterality: N/A;   TOTAL HIP ARTHROPLASTY Right 01/11/2009   @[facility]  by dr melodi   TOTAL HIP ARTHROPLASTY Left 08/19/2013   Procedure: TOTAL HIP ARTHROPLASTY ANTERIOR APPROACH;  Surgeon: Maude KANDICE Herald, MD;  Location: [facility];  Service: Orthopedics;  Laterality: Left;    Family History  Problem Relation Age of Onset   Diabetes Mother    Hypertension Father    Lupus Sister     Social History   Socioeconomic History   Marital status: Married    Spouse name: Not on file   Number of children: 0   Years of education: Not on file   Highest education level: Not on file  Occupational History   Not on file  Tobacco  Use   Smoking status: Former    Types: Cigarettes    Start date: 50    Quit date: 1972    Years since quitting: 53.6   Smokeless tobacco: Never   Tobacco comments:    08-08-2023  per pt quit smoking 1992,  started age 47,   Vaping Use   Vaping status: Never Used  Substance and Sexual Activity   Alcohol use: Yes    Alcohol/week: 7.0 standard drinks of alcohol    Types: 7 Cans of beer per week    Comment: 08-08-2023  one beer per night ,  16 oz   Drug use: Not Currently    Types: Marijuana    Comment: 10/01/2013 last drug use was 10 yr ago   Sexual activity: Yes  Other Topics Concern   Not on file  Social History Narrative   Not on file   Social Drivers of Health   Financial Resource Strain: Not on file  Food Insecurity: No Food Insecurity (09/14/2023)   Hunger Vital Sign    Worried About Running Out of Food in the Last Year: Never true    Ran Out of Food in the Last Year: Never true  Transportation Needs: No Transportation Needs (09/14/2023)   PRAPARE - Administrator, Civil Service (Medical): No    Lack of Transportation (Non-Medical): No  Physical Activity: Not on file  Stress: Not on file  Social Connections: Not on file  Intimate Partner Violence: Not At Risk (09/14/2023)   Humiliation, Afraid, Rape, and Kick questionnaire    Fear of Current or Ex-Partner: No    Emotionally Abused: No    Physically Abused: No    Sexually Abused: No    Review of Systems  Respiratory:  Positive for shortness of breath.     Vitals:   06/11/24 0913  BP: 130/79  Pulse: 63  SpO2: 91%     Physical Exam Constitutional:      Appearance: He is obese.  HENT:     Head: Normocephalic.     Mouth/Throat:     Mouth: Mucous membranes are moist.  Eyes:     General: No scleral icterus. Cardiovascular:     Rate and Rhythm: Normal rate and regular rhythm.     Heart sounds: No murmur heard.  No friction rub.  Pulmonary:     Effort: No respiratory distress.      Breath sounds: No stridor. No wheezing or rhonchi.     Comments: Decreased air movement bilaterally Musculoskeletal:     Cervical back: No rigidity or tenderness.  Neurological:     Mental Status: He is alert.  Psychiatric:        Mood and Affect: Mood normal.    Data Reviewed: Pulmonary function test was reviewed with the patient and compared with previous showing severe obstructive disease  Most recent CT scan 12/05/2023 reviewed with the patient showing evidence of bronchiectasis  Pulmonary function test 06/11/2024 showing severe obstruction with no significant bronchodilator response, hyperinflated lung fields with moderate reduction in diffusing capacity-reviewed with the patient  Assessment:  Bronchiectasis - Stable symptoms at present - Continues to use Trelegy  History of obstructive lung disease, severe COPD - Continue Trelegy - He had tried Breztri  previously, not covered by his insurance  History of prostate cancer, chronic kidney disease  Plan/Recommendations: Continue bronchodilator treatments  Graded activity as tolerated  Continue inhalers  Patient may benefit from [employer] with significant concerns  Tentative follow-up in 3 months   Jennet Epley MD [facility] 06/11/2024, 9:16 AM  CC: Epley Jennet LABOR, MD

## 2024-06-11 NOTE — Patient Instructions (Addendum)
 Continue using Trelegy  Continue using your nebulizer as needed  Graded activity as tolerated  A new medication approved for bronchiectasis is called Brensocatib - I think this will be a good medication to have you on to help keep the bronchiectasis controlled  Follow-up in about 3 months

## 2024-06-11 NOTE — Patient Instructions (Signed)
 Full PFT performed today.

## 2024-06-12 ENCOUNTER — Telehealth: Payer: Self-pay | Admitting: Pulmonary Disease

## 2024-06-12 NOTE — Telephone Encounter (Signed)
 Can we go ahead with initiating Brensocatib for Mr. [facility]  - Pharmacy consult or contacting Brensocatib rep

## 2024-06-13 ENCOUNTER — Telehealth: Payer: Self-pay

## 2024-06-13 NOTE — Telephone Encounter (Signed)
 Submitted a Prior Authorization request to [employer] for BRINSUPRI via CoverMyMeds. Will update once we receive a response.  Key: [id number]

## 2024-06-13 NOTE — Telephone Encounter (Signed)
 New rx

## 2024-06-13 NOTE — Telephone Encounter (Signed)
 Pharmacy team will initiate Brinsupri BIV in separate encounter

## 2024-06-16 NOTE — Telephone Encounter (Signed)
 Received a fax regarding Prior Authorization from [employer] for BRINSUPRI. Authorization has been DENIED because evidence was not submitted supporting that pt had suffered a minimum of 2 exacerbations in the past 12 months prior to treatment.   Submitted an appeal stating that pt has experienced 2 exacerbations (08/24/2023 and 04/07/2024) and attached those encounter notes. Will await response.  Phone# [phone]

## 2024-06-17 ENCOUNTER — Other Ambulatory Visit (HOSPITAL_COMMUNITY): Payer: Self-pay

## 2024-06-17 NOTE — Telephone Encounter (Signed)
 Received notification from [employer] regarding an appeal for BRINSUPRI. Authorization has been APPROVED from 10/24/23 to 10/22/24. Approval letter sent to scan center.  Unable to run test claim because the medication is not built into [place] yet.  Authorization # [id number] Phone # [phone]  **Called patient to advise we need to complete enrollment paperwork, he stated he will come in tomorrow 8/27 at 9 am to do so.**

## 2024-06-18 NOTE — Telephone Encounter (Signed)
 Received Brinsupri new start paperwork. Completed form and faxed with clinicals, appeal approval and insurance card copy to [employer].   Phone#: [phone] Fax#: [phone]

## 2024-06-27 ENCOUNTER — Telehealth: Payer: Self-pay | Admitting: Pulmonary Disease

## 2024-06-27 NOTE — Telephone Encounter (Signed)
 Notification regarding Brinsupri shipment 06/20/2024

## 2024-07-10 NOTE — Telephone Encounter (Signed)
 Per Montgomery Proctor was shipped on 06/18/2024 from [facility]  Phone: [phone]  Sherry Pennant, PharmD, MPH, BCPS, CPP Clinical Pharmacist [facility])

## 2024-10-02 ENCOUNTER — Encounter: Payer: Self-pay | Admitting: Pulmonary Disease

## 2024-10-02 ENCOUNTER — Ambulatory Visit (INDEPENDENT_AMBULATORY_CARE_PROVIDER_SITE_OTHER): Admitting: Pulmonary Disease

## 2024-10-02 VITALS — BP 168/93 | HR 94 | Ht 68.0 in | Wt 218.0 lb

## 2024-10-02 DIAGNOSIS — I2729 Other secondary pulmonary hypertension: Secondary | ICD-10-CM

## 2024-10-02 DIAGNOSIS — J471 Bronchiectasis with (acute) exacerbation: Secondary | ICD-10-CM

## 2024-10-02 DIAGNOSIS — J479 Bronchiectasis, uncomplicated: Secondary | ICD-10-CM

## 2024-10-02 MED ORDER — PREDNISONE 20 MG PO TABS: 20.0000 mg | ORAL_TABLET | Freq: Every day | ORAL | 0 refills | Status: DC

## 2024-10-02 MED ORDER — AZITHROMYCIN 250 MG PO TABS: ORAL_TABLET | ORAL | 0 refills | Status: DC

## 2024-10-02 MED ORDER — FUROSEMIDE 20 MG PO TABS: 20.0000 mg | ORAL_TABLET | Freq: Every day | ORAL | 0 refills | Status: AC

## 2024-10-02 NOTE — Progress Notes (Signed)
 Evan Brock    [id number]    August 28, 1951  Primary Care Physician:Shahid, Gwenith, MD  Referring Physician: No referring provider defined for this encounter.  Chief complaint:   Patient with a history of bronchiectasis In with worsening shortness of breath Many activities do make him short of breath  Discussed the use of AI scribe software for clinical note transcription with the patient, who gave verbal consent to proceed.  History of Present Illness Evan Brock is a 73 year old male with bronchiectasis who presents with worsening shortness of breath.  He experiences worsening shortness of breath, particularly during physical activities such as walking down steps. This symptom tends to worsen annually around the time of weather changes. No chest pain or discomfort, but he occasionally coughs up clear mucus.  He has a history of bronchiectasis with increased mucus production. He has been treated with steroids and antibiotics like Z-Pak in the past, which have provided relief. No leg swelling.  His last breathing study in August 2025 showed reduced lung function with values of 49% and 37% for expected measures. He is currently using Trelegy as part of his treatment regimen.  He recalls having an ultrasound of his heart earlier this year, although the last recorded one was in November 2024. He mentions that his weight has been fluctuating. He was previously started on a medication, Brinsupra, but discontinued it after one to two months due to side effects of nausea and feeling unwell.  We had tried him on brinsupri-for his bronchiectasis Was not tolerated  Previous echocardiogram with pulmonary hypertension  He is compliant with inhalers He had been following up with a physician at [facility]  Cough with minimal sputum production No chest pains or chest discomfort He does have a history of chronic obstructive pulmonary disease  Past treatment for Fournier's  gangrene, history of prostate cancer  Tolerating Trelegy well  Quit smoking in 1992   Outpatient Encounter Medications as of 10/02/2024  Medication Sig   albuterol  (PROVENTIL ) (2.5 MG/3ML) 0.083% nebulizer solution Take 3 mLs (2.5 mg total) by nebulization every 6 (six) hours as needed for wheezing or shortness of breath.   albuterol  (VENTOLIN  HFA) 108 (90 Base) MCG/ACT inhaler Inhale 2 puffs into the lungs every 4 (four) hours as needed for wheezing or shortness of breath.   Ascorbic Acid  (VITAMIN C ) 1000 MG tablet Take 2,000 mg by mouth daily.   azithromycin  (ZITHROMAX  Z-PAK) 250 MG tablet Take 2 tablets day 1 and then 1 daily for 4 days   Brensocatib (BRINSUPRI) 10 MG TABS Take 10 mg by mouth daily.   Cholecalciferol (VITAMIN D3) 1000 units CAPS Take 1,000 Units by mouth daily.   clopidogrel  (PLAVIX ) 75 MG tablet Take 1 tablet (75 mg total) by mouth daily.   feeding supplement (ENSURE ENLIVE / ENSURE PLUS) LIQD Take 237 mLs by mouth 2 (two) times daily between meals.   furosemide (LASIX) 20 MG tablet Take 1 tablet (20 mg total) by mouth daily.   gabapentin  (NEURONTIN ) 800 MG tablet Take 800 mg by mouth See admin instructions. Take 800 mg by mouth three times a day and an additional 800 mg once a day as needed for pain   HYDROcodone -acetaminophen  (NORCO) 10-325 MG tablet Take 1 tablet by mouth See admin instructions. Take 1 tablet by mouth three times a day and an additional 1 tablet up to two times a day as needed for pain   ibuprofen (GOODSENSE IBUPROFEN) 200 MG  tablet Take 800 mg by mouth every 6 (six) hours as needed (for pain).   losartan  (COZAAR ) 100 MG tablet TAKE 1 TABLET(100 MG) BY MOUTH DAILY   metFORMIN  (GLUCOPHAGE ) 500 MG tablet Take 1 tablet (500 mg total) by mouth 2 (two) times daily with a meal. Resume on Sunday 02/19/2022 (Patient taking differently: Take 500 mg by mouth 2 (two) times daily with a meal.)   metoprolol  succinate (TOPROL  XL) 50 MG 24 hr tablet Take 1 tablet (50  mg total) by mouth daily. Hold for hr< 60 or sbp <120   nitroGLYCERIN  (NITROSTAT ) 0.4 MG SL tablet PLACE 1 TABELT UNDER THE TONGUE EVERY 5 MINUTES AS NEEDED FOR CHEST PAIN   predniSONE  (DELTASONE ) 20 MG tablet Take 1 tablet (20 mg total) by mouth daily with breakfast.   rosuvastatin  (CRESTOR ) 40 MG tablet TAKE 1 TABLET BY MOUTH AT BEDTIME (Patient taking differently: Take 40 mg by mouth at bedtime.)   tamsulosin  (FLOMAX ) 0.4 MG CAPS capsule Take 0.8 mg by mouth at bedtime.   tiZANidine  (ZANAFLEX ) 4 MG tablet Take 4 mg by mouth every 8 (eight) hours as needed for muscle spasms.   TRELEGY ELLIPTA 100-62.5-25 MCG/ACT AEPB Take 1 puff by mouth daily as needed (for flares).   No facility-administered encounter medications on file as of 10/02/2024.    Allergies as of 10/02/2024   (No Known Allergies)    Past Medical History:  Diagnosis Date   Anticoagulant long-term use    brilinta / asa--- managed by cardiology   Benign localized prostatic hyperplasia with lower urinary tract symptoms (LUTS)    Chronic constipation    OTC laxative prn   Chronic pain syndrome    pain management--- dr foster [facility])   neck and back   COPD (chronic obstructive pulmonary disease) (HCC)    followed by pcp ---  not on oxygen  (previous seen by pulmonology-- dr byrum--- lov in epic 07-12-2020);   last exacerbation [facility]-23-2023 [facility]  pneumonia/ hypoxia w/ O2 82% refused ED,  then ED visit 05-17-2023 hypoxia / treated w/ nebs, antibiotics;  back at urgent care 06-06-2023  exacerbation / pneumonia/ +COVID,  O2 90% treated w/ nebs/ prednisone / antibiotics   Coronary artery disease 01/2022   cardiologist--- dr osie;  NSTEMI  s/p cath 02-16-2022  thrombectomy / PTCA w/ DES to LCX;   DDD (degenerative disc disease), cervical    DDD (degenerative disc disease), lumbosacral    ED (erectile dysfunction)    Exertional shortness of breath    08-08-2023   unable to do stairs or yard word, but can do household  chores ok   History of acute respiratory failure    several times w/ hypoxia   History of asbestos exposure    History of non-ST elevation myocardial infarction (NSTEMI) 02/16/2022   Hyperlipidemia, mixed    Hypertension    Malignant neoplasm prostate [facility]) 12/2022   urologist-- dr pace/  radiation oncologist--- dr patrcia;   dx 03/ 2024, gleason 4+5   Moderate persistent asthma    OA (osteoarthritis)    S/P drug eluting coronary stent placement 02/17/2023   x1 to LCx   Type 2 diabetes mellitus (HCC)    followed by pcp   (08-08-2023  per pt checks blood sugar one a month)   Wears partial dentures    upper only    Past Surgical History:  Procedure Laterality Date   CORONARY STENT INTERVENTION N/A 02/16/2022   Procedure: CORONARY STENT INTERVENTION;  Surgeon: Elmira Newman PARAS,  MD;  Location: [facility];  Service: Cardiovascular;  Laterality: N/A;   CORONARY THROMBECTOMY N/A 02/16/2022   Procedure: Coronary Thrombectomy;  Surgeon: Elmira Newman PARAS, MD;  Location: [facility];  Service: Cardiovascular;  Laterality: N/A;   GOLD SEED IMPLANT N/A 08/14/2023   Procedure: GOLD SEED IMPLANT;  Surgeon: Elisabeth Valli BIRCH, MD;  Location: [facility];  Service: Urology;  Laterality: N/A;  30 MINUTES   INCISION AND DRAINAGE ABSCESS N/A 09/13/2023   Procedure: INCISION AND DRAINAGE ABSCESS;  Surgeon: Devere Lonni Righter, MD;  Location: [facility];  Service: Urology;  Laterality: N/A;   LEFT HEART CATH AND CORONARY ANGIOGRAPHY N/A 02/16/2022   Procedure: LEFT HEART CATH AND CORONARY ANGIOGRAPHY;  Surgeon: Elmira Newman PARAS, MD;  Location: [facility];  Service: Cardiovascular;  Laterality: N/A;   SCROTAL EXPLORATION N/A 09/14/2023   Procedure: PERINEAL IRRIGATION AND DEBRIDEMENT, WOUND DRESSING CHANGE;  Surgeon: Elisabeth Valli BIRCH, MD;  Location: [facility];  Service: Urology;  Laterality: N/A;   SCROTAL EXPLORATION N/A 09/25/2023   Procedure: PERINEAL  WASHOUT AND CLOSURE, SCROTAL EXPLORATION, AND REPACKING OF ABSCESS CAVITY;  Surgeon: Elisabeth Valli BIRCH, MD;  Location: [facility];  Service: Urology;  Laterality: N/A;   SPACE OAR INSTILLATION N/A 08/14/2023   Procedure: SPACE OAR INSTILLATION;  Surgeon: Elisabeth Valli BIRCH, MD;  Location: [facility];  Service: Urology;  Laterality: N/A;   TOTAL HIP ARTHROPLASTY Right 01/11/2009   @[facility]  by dr melodi   TOTAL HIP ARTHROPLASTY Left 08/19/2013   Procedure: TOTAL HIP ARTHROPLASTY ANTERIOR APPROACH;  Surgeon: Maude KANDICE Herald, MD;  Location: [facility];  Service: Orthopedics;  Laterality: Left;    Family History  Problem Relation Age of Onset   Diabetes Mother    Hypertension Father    Lupus Sister     Social History   Socioeconomic History   Marital status: Married    Spouse name: Not on file   Number of children: 0   Years of education: Not on file   Highest education level: Not on file  Occupational History   Not on file  Tobacco Use   Smoking status: Former    Types: Cigarettes    Start date: 68    Quit date: 1972    Years since quitting: 53.9   Smokeless tobacco: Never   Tobacco comments:    08-08-2023  per pt quit smoking 1992,  started age 10,   Vaping Use   Vaping status: Never Used  Substance and Sexual Activity   Alcohol use: Yes    Alcohol/week: 7.0 standard drinks of alcohol    Types: 7 Cans of beer per week    Comment: 08-08-2023  one beer per night ,  16 oz   Drug use: Not Currently    Types: Marijuana    Comment: 10/01/2013 last drug use was 10 yr ago   Sexual activity: Yes  Other Topics Concern   Not on file  Social History Narrative   Not on file   Social Drivers of Health   Tobacco Use: Medium Risk (10/02/2024)   Patient History    Smoking Tobacco Use: Former    Smokeless Tobacco Use: Never    Passive Exposure: Not on Actuary Strain: Not on file  Food Insecurity: No Food Insecurity (09/14/2023)   Hunger Vital Sign     Worried About Running Out of Food in the Last Year: Never true    Ran Out of  Food in the Last Year: Never true  Transportation Needs: No Transportation Needs (09/14/2023)   PRAPARE - Administrator, Civil Service (Medical): No    Lack of Transportation (Non-Medical): No  Physical Activity: Not on file  Stress: Not on file  Social Connections: Not on file  Intimate Partner Violence: Not At Risk (09/14/2023)   Humiliation, Afraid, Rape, and Kick questionnaire    Fear of Current or Ex-Partner: No    Emotionally Abused: No    Physically Abused: No    Sexually Abused: No  Depression (PHQ2-9): Low Risk (03/06/2023)   Depression (PHQ2-9)    PHQ-2 Score: 0  Alcohol Screen: Low Risk (03/06/2023)   Alcohol Screen    Last Alcohol Screening Score (AUDIT): 3  Housing: Low Risk (09/14/2023)   Housing    Last Housing Risk Score: 0  Utilities: Not At Risk (09/14/2023)   AHC Utilities    Threatened with loss of utilities: No  Health Literacy: Not on file    Review of Systems  Constitutional:  Positive for fatigue.  Respiratory:  Positive for cough and shortness of breath.     Vitals:   10/02/24 0849  BP: (!) 168/93  Pulse: 94  SpO2: 91%     Physical Exam Constitutional:      Appearance: He is obese.  HENT:     Head: Normocephalic.     Mouth/Throat:     Mouth: Mucous membranes are moist.  Eyes:     General: No scleral icterus. Cardiovascular:     Rate and Rhythm: Normal rate.     Heart sounds: No murmur heard.    No friction rub.  Pulmonary:     Effort: No respiratory distress.     Breath sounds: No stridor. No wheezing or rhonchi.  Musculoskeletal:     Cervical back: No rigidity or tenderness.  Neurological:     Mental Status: He is alert.  Psychiatric:        Mood and Affect: Mood normal.     Data Reviewed: Echocardiogram 09/14/2023-severe pulmonary hypertension, ejection fraction of 60 to 65%, left ventricular hypertrophy  CT scan of the chest  12/05/2023-evidence of bronchiectasis, reviewed with the patient  Pulmonary function test 06/11/2024 - Severe obstructive disease with air trapping, moderate reduction in diffusing capacity  Assessment and Plan Assessment & Plan Bronchiectasis Chronic bronchiectasis with progressive shortness of breath, increased mucus production, and clear mucus expectoration. Previous CT scan in February showed significant bronchiectasis. Brinsupri was discontinued due to side effects. Shortness of breath may be exacerbated by pulmonary hypertension. Transplant considered if shortness of breath becomes constant and unmanageable-this will have to be considered if his symptoms are worsening - Repeat echocardiogram to assess right-sided heart pressures. - Prescribed low-dose diuretics (Lasix) to manage fluid congestion. - Prescribed antibiotics (Z-Pak) to address potential infection. - Continue Trelegy for bronchiectasis management. 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 Previous echocardiogram showed right-sided pressure of 63.7 mmHg, above the normal range of less than 40 mmHg. Shortness of breath may be exacerbated by increased pulmonary pressures. Further evaluation needed to determine if cardiac catheterization is necessary. - Repeated echocardiogram to reassess right-sided heart pressures. - Prescribed low-dose diuretics (Lasix) to manage fluid congestion. - Will consider referral to cardiologist for cardiac catheterization if pressures remain elevated.  Concern with his worsening shortness of breath  with minimal activity  I will give him a course of antibiotics and steroids azithromycin  and prednisone   Encouraged to continue using Trelegy  Follow-up on echocardiogram to assess his pulmonary  hypertension May need further evaluation-right heart catheterization to assess severity of his pulmonary hypertension  Cautious diuresis with Lasix 20 mg daily  I will see him in about 6 to 8 weeks  Encouraged to give us  a call with any significant concerns if any worsening of his symptoms  Did not tolerate brinsupri   Orders Placed This Encounter  Procedures   CT Chest Wo Contrast    Standing Status:   Future    Expiration Date:   10/02/2025    Preferred imaging location?:   [facility]   ECHOCARDIOGRAM COMPLETE    Standing Status:   Future    Expiration Date:   10/02/2025    Where [facility] this test [facility] performed[facility]    Does the patient weigh less than or greater than 250 lbs?:   Patient weighs less than 250 lbs    Perflutren  DEFINITY  (image enhancing agent) should be administered unless hypersensitivity or allergy exist:   Administer Perflutren     Reason for exam-Echo:   Pulmonary hypertension I27.2   Pulmonary Function Test    Needs a full PFT Bronchiectasis, diffusing capacity needed    Standing Status:   Future    Expiration Date:   10/02/2025    Full PFT:   Yes    I personally spent a total of 41 minutes in the care of the patient today including preparing to see the patient, getting/reviewing separately obtained history, performing a medically appropriate exam/evaluation, counseling and educating, placing orders, documenting clinical information in the EHR, and independently interpreting results.   Jennet Epley MD [facility] 10/02/2024, 9:12 AM  CC: No ref. provider found

## 2024-10-02 NOTE — Patient Instructions (Signed)
 Bronchiectasis with exacerbation -We will give you a course of antibiotics and steroids  Pulmonary hypertension - Will repeat your echocardiogram - Low dose of Lasix - Make sure you are eating more fruits to keep your potassium level optimized  For the bronchiectasis - We will repeat a CT scan of the chest -Schedule you for breathing study  Tentative follow-up in about 4 to 6 weeks  Continue Trelegy  Give us  a call with any significant concerns

## 2024-10-06 ENCOUNTER — Encounter: Payer: Self-pay | Admitting: Pulmonary Disease

## 2024-10-08 ENCOUNTER — Ambulatory Visit (HOSPITAL_COMMUNITY): Admission: RE | Admit: 2024-10-08 | Discharge: 2024-10-08 | Attending: Pulmonary Disease

## 2024-10-08 DIAGNOSIS — I77810 Thoracic aortic ectasia: Secondary | ICD-10-CM | POA: Insufficient documentation

## 2024-10-08 DIAGNOSIS — I083 Combined rheumatic disorders of mitral, aortic and tricuspid valves: Secondary | ICD-10-CM | POA: Diagnosis not present

## 2024-10-08 DIAGNOSIS — I2729 Other secondary pulmonary hypertension: Secondary | ICD-10-CM | POA: Insufficient documentation

## 2024-10-08 LAB — ECHOCARDIOGRAM COMPLETE
AR max vel: 1.73 cm2
AV Area VTI: 1.61 cm2
AV Area mean vel: 1.66 cm2
AV Mean grad: 6 mmHg
AV Peak grad: 11.8 mmHg
Ao pk vel: 1.72 m/s
Area-P 1/2: 4.27 cm2
MV VTI: 2.13 cm2
S' Lateral: 3.2 cm

## 2024-10-10 ENCOUNTER — Inpatient Hospital Stay: Admission: RE | Admit: 2024-10-10 | Discharge: 2024-10-10 | Attending: Pulmonary Disease

## 2024-10-10 DIAGNOSIS — J479 Bronchiectasis, uncomplicated: Secondary | ICD-10-CM

## 2024-11-26 ENCOUNTER — Other Ambulatory Visit: Payer: Self-pay

## 2024-11-26 ENCOUNTER — Encounter (HOSPITAL_COMMUNITY): Payer: Self-pay | Admitting: Emergency Medicine

## 2024-11-26 ENCOUNTER — Ambulatory Visit (INDEPENDENT_AMBULATORY_CARE_PROVIDER_SITE_OTHER)

## 2024-11-26 ENCOUNTER — Ambulatory Visit (HOSPITAL_COMMUNITY)
Admission: EM | Admit: 2024-11-26 | Discharge: 2024-11-26 | Disposition: A | Discharge status: Home / Self Care | Source: Home / Self Care | Attending: Family Medicine | Admitting: Family Medicine

## 2024-11-26 DIAGNOSIS — R0602 Shortness of breath: Secondary | ICD-10-CM

## 2024-11-26 DIAGNOSIS — J441 Chronic obstructive pulmonary disease with (acute) exacerbation: Secondary | ICD-10-CM | POA: Diagnosis not present

## 2024-11-26 MED ORDER — METHYLPREDNISOLONE SODIUM SUCC 125 MG IJ SOLR
INTRAMUSCULAR | Status: AC
  Filled 2024-11-26: qty 2

## 2024-11-26 MED ORDER — IPRATROPIUM-ALBUTEROL 0.5-2.5 (3) MG/3ML IN SOLN
3.0000 mL | Freq: Once | RESPIRATORY_TRACT | Status: AC
  Administered 2024-11-26: 3 mL via RESPIRATORY_TRACT

## 2024-11-26 MED ORDER — IPRATROPIUM-ALBUTEROL 0.5-2.5 (3) MG/3ML IN SOLN
RESPIRATORY_TRACT | Status: AC
  Filled 2024-11-26: qty 3

## 2024-11-26 MED ORDER — ALBUTEROL SULFATE (2.5 MG/3ML) 0.083% IN NEBU: 2.5000 mg | INHALATION_SOLUTION | Freq: Four times a day (QID) | RESPIRATORY_TRACT | 2 refills | Status: AC | PRN

## 2024-11-26 MED ORDER — METHYLPREDNISOLONE SODIUM SUCC 125 MG IJ SOLR
125.0000 mg | Freq: Once | INTRAMUSCULAR | Status: AC
  Administered 2024-11-26: 125 mg via INTRAMUSCULAR

## 2024-11-26 MED ORDER — METHYLPREDNISOLONE 4 MG PO TBPK: ORAL_TABLET | ORAL | 0 refills | Status: AC

## 2024-11-26 MED ORDER — ALBUTEROL SULFATE HFA 108 (90 BASE) MCG/ACT IN AERS: 2.0000 | INHALATION_SPRAY | RESPIRATORY_TRACT | 2 refills | Status: AC | PRN

## 2024-11-26 NOTE — ED Triage Notes (Signed)
 Symptoms started 2 days ago.  Noticed sob with activity.  Walking up steps caused sob.  Normally this is not an issue.  patient states he has copd and sometimes it acts up states this is how he feels when it acts up.  States he usually comes in and gets a steroid shot.  Denies pain  On arrival to treatment room from lobby, pulse ox on room air was in 80's.  Sitting, resting, pulse ox recovered quickly and verified with 2 different pieces of pulse ox equipment.  Patient speaking in complete sentences

## 2024-11-26 NOTE — Medical Student Note (Signed)
 Engineer, Site Note For educational purposes for Medical, PA and NP students only and not part of the legal medical record.   CSN: [id number] Arrival date & time: 11/26/24  9087      History   Chief Complaint No chief complaint on file.   HPI Evan Brock is a 74 y.o. male with a hx of COPD, CAD, DM2.  He presents with 2 day hx of DOE. He denies any changes in cough or sputum production stating his cough is rare. He denies fever, chills, malaise, CP, or nausea. He reports that he normally has a COPD exacerbation twice a year in with once being in the middle of summer and once in the middle of winter. His last exacerbation was last summer. He feels like this is his usual COPD exacerbation that typically improves with steroids. He has not been on abx or steroids recently. He is a former smoker and does not currently smoke. He does have an SpO2 monitor at home and is able to track his oxygen . The triage nurse reported that his SpO2 dropped to 83% while walking back to the room, but the patient did not exhibit any distress and was speaking in complete sentences.   The history is provided by the patient.    Past Medical History:  Diagnosis Date   Anticoagulant long-term use    brilinta / asa--- managed by cardiology   Benign localized prostatic hyperplasia with lower urinary tract symptoms (LUTS)    Chronic constipation    OTC laxative prn   Chronic pain syndrome    pain management--- dr foster [facility])   neck and back   COPD (chronic obstructive pulmonary disease) (HCC)    followed by pcp ---  not on oxygen  (previous seen by pulmonology-- dr byrum--- lov in epic 07-12-2020);   last exacerbation [facility]-23-2023 [facility]  pneumonia/ hypoxia w/ O2 82% refused ED,  then ED visit 05-17-2023 hypoxia / treated w/ nebs, antibiotics;  back at urgent care 06-06-2023  exacerbation / pneumonia/ +COVID,  O2 90% treated w/ nebs/ prednisone / antibiotics   Coronary artery  disease 01/2022   cardiologist--- dr osie;  NSTEMI  s/p cath 02-16-2022  thrombectomy / PTCA w/ DES to LCX;   DDD (degenerative disc disease), cervical    DDD (degenerative disc disease), lumbosacral    ED (erectile dysfunction)    Exertional shortness of breath    08-08-2023   unable to do stairs or yard word, but can do household chores ok   History of acute respiratory failure    several times w/ hypoxia   History of asbestos exposure    History of non-ST elevation myocardial infarction (NSTEMI) 02/16/2022   Hyperlipidemia, mixed    Hypertension    Malignant neoplasm prostate [facility]) 12/2022   urologist-- dr pace/  radiation oncologist--- dr patrcia;   dx 03/ 2024, gleason 4+5   Moderate persistent asthma    OA (osteoarthritis)    S/P drug eluting coronary stent placement 02/17/2023   x1 to LCx   Type 2 diabetes mellitus (HCC)    followed by pcp   (08-08-2023  per pt checks blood sugar one a month)   Wears partial dentures    upper only    Patient Active Problem List   Diagnosis Date Noted   Septic shock (HCC) 09/13/2023   Fournier's gangrene (HCC) 09/13/2023   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   Acute hypoxic respiratory failure (HCC) 09/08/2022   Status post  coronary artery stent placement    S/P PTCA (percutaneous transluminal coronary angioplasty)    Non-insulin  dependent type 2 diabetes mellitus (HCC)    Mixed hyperlipidemia    Coronary atherosclerosis due to calcified coronary lesion    Former smoker    Class 1 obesity due to excess calories with serious comorbidity and body mass index (BMI) of 31.0 to 31.9 in adult    Non-ST elevation (NSTEMI) myocardial infarction (HCC) 02/16/2022   NSTEMI (non-ST elevated myocardial infarction) (HCC) 02/16/2022   Acute respiratory failure with hypoxia (HCC) 11/12/2021   CAP (community acquired pneumonia) 02/16/2021   Hypoxia 02/16/2021   COPD with acute exacerbation (HCC) 02/16/2021   Type 2 diabetes mellitus with  obesity 02/16/2021   AKI (acute kidney injury) 02/16/2021   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   Leukocytosis 10/04/2015   COPD (chronic obstructive pulmonary disease) (HCC) 10/01/2015   Essential hypertension, benign 03/01/2015   Asthma with acute exacerbation 01/26/2015   Thrombocytopenia, unspecified (HCC) 11/06/2013   Acute respiratory failure (HCC) with hypoxia 10/01/2013   Acute renal failure (HCC) 10/01/2013    Past Surgical History:  Procedure Laterality Date   CORONARY STENT INTERVENTION N/A 02/16/2022   Procedure: CORONARY STENT INTERVENTION;  Surgeon: Elmira Newman PARAS, MD;  Location: [facility];  Service: Cardiovascular;  Laterality: N/A;   CORONARY THROMBECTOMY N/A 02/16/2022   Procedure: Coronary Thrombectomy;  Surgeon: Elmira Newman PARAS, MD;  Location: [facility];  Service: Cardiovascular;  Laterality: N/A;   GOLD SEED IMPLANT N/A 08/14/2023   Procedure: GOLD SEED IMPLANT;  Surgeon: Elisabeth Valli BIRCH, MD;  Location[facility];  Service: Urology;  Laterality: N/A;  30 MINUTES   INCISION AND DRAINAGE ABSCESS N/A 09/13/2023   Procedure: INCISION AND DRAINAGE ABSCESS;  Surgeon: Devere Lonni Righter, MD;  Location: [facility];  Service: Urology;  Laterality: N/A;   LEFT HEART CATH AND CORONARY ANGIOGRAPHY N/A 02/16/2022   Procedure: LEFT HEART CATH AND CORONARY ANGIOGRAPHY;  Surgeon: Elmira Newman PARAS, MD;  Location: [facility];  Service: Cardiovascular;  Laterality: N/A;   SCROTAL EXPLORATION N/A 09/14/2023   Procedure: PERINEAL IRRIGATION AND DEBRIDEMENT, WOUND DRESSING CHANGE;  Surgeon: Elisabeth Valli BIRCH, MD;  Location: [facility];  Service: Urology;  Laterality: N/A;   SCROTAL EXPLORATION N/A 09/25/2023   Procedure: PERINEAL WASHOUT AND CLOSURE, SCROTAL EXPLORATION, AND REPACKING OF ABSCESS CAVITY;  Surgeon: Elisabeth Valli BIRCH, MD;  Location: [facility];  Service: Urology;  Laterality: N/A;   SPACE OAR INSTILLATION N/A 08/14/2023    Procedure: SPACE OAR INSTILLATION;  Surgeon: Elisabeth Valli BIRCH, MD;  Location: [facility];  Service: Urology;  Laterality: N/A;   TOTAL HIP ARTHROPLASTY Right 01/11/2009   @[facility]  by dr melodi   TOTAL HIP ARTHROPLASTY Left 08/19/2013   Procedure: TOTAL HIP ARTHROPLASTY ANTERIOR APPROACH;  Surgeon: Maude KANDICE Herald, MD;  Location: [facility];  Service: Orthopedics;  Laterality: Left;       Home Medications    Prior to Admission medications  Medication Sig Start Date End Date Taking? Authorizing Provider  albuterol  (PROVENTIL ) (2.5 MG/3ML) 0.083% nebulizer solution Take 3 mLs (2.5 mg total) by nebulization every 6 (six) hours as needed for wheezing or shortness of breath. 06/11/24   Olalere, Adewale A, MD  albuterol  (VENTOLIN  HFA) 108 (90 Base) MCG/ACT inhaler Inhale 2 puffs into the lungs every 4 (four) hours as needed for wheezing or shortness of breath. 04/07/24   Enedelia Dorna HERO, FNP  Ascorbic Acid  (VITAMIN  C) 1000 MG tablet Take 2,000 mg by mouth daily.    [provider]  azithromycin  (ZITHROMAX  Z-PAK) 250 MG tablet Take 2 tablets day 1 and then 1 daily for 4 days 10/02/24   Neda Hammond A, MD  Brensocatib (BRINSUPRI) 10 MG TABS Take 10 mg by mouth daily.    [provider]  Cholecalciferol (VITAMIN D3) 1000 units CAPS Take 1,000 Units by mouth daily.    [provider]  clopidogrel  (PLAVIX ) 75 MG tablet Take 1 tablet (75 mg total) by mouth daily. 11/29/23   Tolia, Sunit, DO  feeding supplement (ENSURE ENLIVE / ENSURE PLUS) LIQD Take 237 mLs by mouth 2 (two) times daily between meals. 09/27/23   Christobal Guadalajara, MD  furosemide  (LASIX ) 20 MG tablet Take 1 tablet (20 mg total) by mouth daily. 10/02/24 10/12/24  Neda Hammond LABOR, MD  gabapentin  (NEURONTIN ) 800 MG tablet Take 800 mg by mouth See admin instructions. Take 800 mg by mouth three times a day and an additional 800 mg once a day as needed for pain 02/01/22   [provider]   HYDROcodone -acetaminophen  (NORCO) 10-325 MG tablet Take 1 tablet by mouth See admin instructions. Take 1 tablet by mouth three times a day and an additional 1 tablet up to two times a day as needed for pain    [provider]  ibuprofen (GOODSENSE IBUPROFEN) 200 MG tablet Take 800 mg by mouth every 6 (six) hours as needed (for pain).    [provider]  losartan  (COZAAR ) 100 MG tablet TAKE 1 TABLET(100 MG) BY MOUTH DAILY 05/22/24   Tolia, Sunit, DO  metFORMIN  (GLUCOPHAGE ) 500 MG tablet Take 1 tablet (500 mg total) by mouth 2 (two) times daily with a meal. Resume on Sunday 02/19/2022 Patient taking differently: Take 500 mg by mouth 2 (two) times daily with a meal. 02/17/22   Patwardhan, Manish J, MD  metoprolol  succinate (TOPROL  XL) 50 MG 24 hr tablet Take 1 tablet (50 mg total) by mouth daily. Hold for hr< 60 or sbp <120 11/29/23   Tolia, Sunit, DO  nitroGLYCERIN  (NITROSTAT ) 0.4 MG SL tablet PLACE 1 TABELT UNDER THE TONGUE EVERY 5 MINUTES AS NEEDED FOR CHEST PAIN 03/19/24   Michele, Sunit, DO  predniSONE  (DELTASONE ) 20 MG tablet Take 1 tablet (20 mg total) by mouth daily with breakfast. 10/02/24   Olalere, Adewale A, MD  rosuvastatin  (CRESTOR ) 40 MG tablet TAKE 1 TABLET BY MOUTH AT BEDTIME Patient taking differently: Take 40 mg by mouth at bedtime. 07/07/22   Tolia, Sunit, DO  tamsulosin  (FLOMAX ) 0.4 MG CAPS capsule Take 0.8 mg by mouth at bedtime.    [provider]  tiZANidine  (ZANAFLEX ) 4 MG tablet Take 4 mg by mouth every 8 (eight) hours as needed for muscle spasms. 02/15/23   [provider]  TRELEGY ELLIPTA 100-62.5-25 MCG/ACT AEPB Take 1 puff by mouth daily as needed (for flares). 12/21/21   [provider]    Family History Family History  Problem Relation Age of Onset   Diabetes Mother    Hypertension Father    Lupus Sister     Social History Social History[1]   Allergies   Patient has no known allergies.   Review of Systems Review of  Systems  Constitutional:  Negative for chills, fatigue and fever.  HENT:  Negative for congestion, rhinorrhea and sore throat.   Respiratory:  Positive for shortness of breath. Negative for cough and wheezing.   Cardiovascular:  Negative for chest  pain and palpitations.  Gastrointestinal:  Negative for abdominal pain, diarrhea, nausea and vomiting.  Neurological:  Negative for weakness, light-headedness and headaches.     Physical Exam Updated Vital Signs BP 138/81 (BP Location: Left Arm) Comment (BP Location): large cuff  Pulse 74   Temp 97.8 F (36.6 C) (Oral)   Resp (!) 24   SpO2 96% Comment: read with patient perfectly still and no talking  Physical Exam Constitutional:      Appearance: Normal appearance.  HENT:     Nose: Nose normal.     Mouth/Throat:     Mouth: Mucous membranes are moist.     Pharynx: Oropharynx is clear. No oropharyngeal exudate or posterior oropharyngeal erythema.  Cardiovascular:     Rate and Rhythm: Normal rate and regular rhythm.     Pulses: Normal pulses.     Heart sounds: Normal heart sounds.  Pulmonary:     Effort: Tachypnea present.     Breath sounds: Decreased breath sounds present. No wheezing, rhonchi or rales.     Comments: Mild tachypnea and decreased breath sounds throughout. Able to speak in complete sentences between breaths.  Abdominal:     Palpations: Abdomen is soft.     Tenderness: There is no abdominal tenderness.  Musculoskeletal:     Cervical back: Neck supple.  Lymphadenopathy:     Cervical: No cervical adenopathy.  Skin:    General: Skin is warm and dry.     Coloration: Skin is not cyanotic.  Neurological:     Mental Status: He is alert.      ED Treatments / Results  Labs (all labs ordered are listed, but only abnormal results are displayed) Labs Reviewed - No data to display  EKG  Radiology DG Chest 2 View Result Date: 11/26/2024 CLINICAL DATA:  Shortness of breath for 2 days EXAM: CHEST - 2 VIEW COMPARISON:   April 07, 2024 FINDINGS: Stable cardiomediastinal silhouette. Both lungs are clear. The visualized skeletal structures are unremarkable. IMPRESSION: No active cardiopulmonary disease. Electronically Signed   By: Lynwood Landy Raddle M.D.   On: 11/26/2024 10:34    Procedures Procedures (including critical care time)  Medications Ordered in ED Medications  ipratropium-albuterol  (DUONEB) 0.5-2.5 (3) MG/3ML nebulizer solution 3 mL (has no administration in time range)  methylPREDNISolone  sodium succinate (SOLU-MEDROL ) 125 mg/2 mL injection 125 mg (has no administration in time range)     Initial Impression / Assessment and Plan / ED Course  I have reviewed the triage vital signs and the nursing notes.  Pertinent labs & imaging results that were available during my care of the patient were reviewed by me and considered in my medical decision making (see chart for details).    ED Course  CXR is negative.   Will admin Duoneb 2.5/0.5 mg via HHN and 125 of Solumedrol IM then reassess.   Upon reassessment pt has subjective improvement. Lung sounds are slightly improved air movement bilaterally and can now appreciate a wheeze in the R lower lobe. Pt was ambulated with SpO2 and it started at 98% on RA while at rest and lowest it dropped was 93% with ambulation.  COPD Exacerbation  Will start a Medrol  DosePak and refill his albuterol  MDI and albuterol  2.5mg /25ml neb solution. Instructed to do scheduled neb treatments every 4 hours while awake for the next 24 hours then return to his twice daily routine with neb and PRN with albuterol  MDI. Pt counseled to monitor SpO2 regularly at home. Strict ER precautions given  if worsening of symptoms, SpO2 below 90%, increased sputum production, or signs of infection.   Red flag symptoms reviewed and return precautions given.    Final Clinical Impressions(s) / ED Diagnoses   Final diagnoses:  SOB (shortness of breath)    New Prescriptions New Prescriptions    No medications on file       [1]  Social History Tobacco Use   Smoking status: Former    Types: Cigarettes    Start date: 65    Quit date: 1972    Years since quitting: 54.1   Smokeless tobacco: Never   Tobacco comments:    08-08-2023  per pt quit smoking 1992,  started age 33,   Vaping Use   Vaping status: Never Used  Substance Use Topics   Alcohol use: Yes    Alcohol/week: 7.0 standard drinks of alcohol    Types: 7 Cans of beer per week    Comment: 08-08-2023  one beer per night ,  16 oz   Drug use: Not Currently    Types: Marijuana    Comment: 10/01/2013 last drug use was 10 yr ago   "

## 2024-11-26 NOTE — ED Provider Notes (Signed)
 " [facility]  CSN: [id number] Arrival date & time: 11/26/24  9087       History              Chief Complaint No chief complaint on file.     HPI Evan Brock is a 74 y.o. male with a hx of COPD, CAD, DM2.   He presents with 2 day hx of DOE. He denies any changes in cough or sputum production stating his cough is rare. He denies fever, chills, malaise, CP, or nausea. He reports that he normally has a COPD exacerbation twice a year in with once being in the middle of summer and once in the middle of winter. His last exacerbation was last summer. He feels like this is his usual COPD exacerbation that typically improves with steroids. He has not been on abx or steroids recently. He is a former smoker and does not currently smoke. He does have an SpO2 monitor at home and is able to track his oxygen . The triage nurse reported that his SpO2 dropped to 83% while walking back to the room, but the patient did not exhibit any distress and was speaking in complete sentences.   The history is provided by the patient.         Past Medical History:  Diagnosis Date   Anticoagulant long-term use      brilinta / asa--- managed by cardiology   Benign localized prostatic hyperplasia with lower urinary tract symptoms (LUTS)     Chronic constipation      OTC laxative prn   Chronic pain syndrome      pain management--- dr foster [facility])   neck and back   COPD (chronic obstructive pulmonary disease) (HCC)      followed by pcp ---  not on oxygen  (previous seen by pulmonology-- dr byrum--- lov in epic 07-12-2020);   last exacerbation [facility]23-2023 [facility]  pneumonia/ hypoxia w/ O2 82% refused ED,  then ED visit 05-17-2023 hypoxia / treated w/ nebs, antibiotics;  back at urgent care 06-06-2023  exacerbation / pneumonia/ +COVID,  O2 90% treated w/ nebs/ prednisone / antibiotics   Coronary artery disease 01/2022    cardiologist--- dr osie;  NSTEMI  s/p cath 02-16-2022  thrombectomy / PTCA w/ DES  to LCX;   DDD (degenerative disc disease), cervical     DDD (degenerative disc disease), lumbosacral     ED (erectile dysfunction)     Exertional shortness of breath      08-08-2023   unable to do stairs or yard word, but can do household chores ok   History of acute respiratory failure      several times w/ hypoxia   History of asbestos exposure     History of non-ST elevation myocardial infarction (NSTEMI) 02/16/2022   Hyperlipidemia, mixed     Hypertension     Malignant neoplasm prostate [facility]) 12/2022    urologist-- dr pace/  radiation oncologist--- dr patrcia;   dx 03/ 2024, gleason 4+5   Moderate persistent asthma     OA (osteoarthritis)     S/P drug eluting coronary stent placement 02/17/2023    x1 to LCx   Type 2 diabetes mellitus (HCC)      followed by pcp   (08-08-2023  per pt checks blood sugar one a month)   Wears partial dentures      upper only              Patient Active Problem List  Diagnosis Date Noted   Septic shock (HCC) 09/13/2023   Fournier's gangrene (HCC) 09/13/2023   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   Acute hypoxic respiratory failure (HCC) 09/08/2022   Status post coronary artery stent placement     S/P PTCA (percutaneous transluminal coronary angioplasty)     Non-insulin  dependent type 2 diabetes mellitus (HCC)     Mixed hyperlipidemia     Coronary atherosclerosis due to calcified coronary lesion     Former smoker     Class 1 obesity due to excess calories with serious comorbidity and body mass index (BMI) of 31.0 to 31.9 in adult     Non-ST elevation (NSTEMI) myocardial infarction (HCC) 02/16/2022   NSTEMI (non-ST elevated myocardial infarction) (HCC) 02/16/2022   Acute respiratory failure with hypoxia (HCC) 11/12/2021   CAP (community acquired pneumonia) 02/16/2021   Hypoxia 02/16/2021   COPD with acute exacerbation (HCC) 02/16/2021   Type 2 diabetes mellitus with obesity 02/16/2021   AKI (acute kidney injury) 02/16/2021    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   Leukocytosis 10/04/2015   COPD (chronic obstructive pulmonary disease) (HCC) 10/01/2015   Essential hypertension, benign 03/01/2015   Asthma with acute exacerbation 01/26/2015   Thrombocytopenia, unspecified (HCC) 11/06/2013   Acute respiratory failure (HCC) with hypoxia 10/01/2013   Acute renal failure (HCC) 10/01/2013           Past Surgical History:  Procedure Laterality Date   CORONARY STENT INTERVENTION N/A 02/16/2022    Procedure: CORONARY STENT INTERVENTION;  Surgeon: Elmira Newman PARAS, MD;  [facility];  Service: Cardiovascular;  Laterality: N/A;   CORONARY THROMBECTOMY N/A 02/16/2022    Procedure: Coronary Thrombectomy;  Surgeon: Elmira Newman PARAS, MD;  [facility];  Service: Cardiovascular;  Laterality: N/A;   GOLD SEED IMPLANT N/A 08/14/2023    Procedure: GOLD SEED IMPLANT;  Surgeon: Elisabeth Valli BIRCH, MD;  Location: [facility];  Service: Urology;  Laterality: N/A;  30 MINUTES   INCISION AND DRAINAGE ABSCESS N/A 09/13/2023    Procedure: INCISION AND DRAINAGE ABSCESS;  Surgeon: Devere Lonni Righter, MD;  Location: [facility];  Service: Urology;  Laterality: N/A;   LEFT HEART CATH AND CORONARY ANGIOGRAPHY N/A 02/16/2022    Procedure: LEFT HEART CATH AND CORONARY ANGIOGRAPHY;  Surgeon: Elmira Newman PARAS, MD;  [facility];  Service: Cardiovascular;  Laterality: N/A;   SCROTAL EXPLORATION N/A 09/14/2023    Procedure: PERINEAL IRRIGATION AND DEBRIDEMENT, WOUND DRESSING CHANGE;  Surgeon: Elisabeth Valli BIRCH, MD;  Location: [facility];  Service: Urology;  Laterality: N/A;   SCROTAL EXPLORATION N/A 09/25/2023    Procedure: PERINEAL WASHOUT AND CLOSURE, SCROTAL EXPLORATION, AND REPACKING OF ABSCESS CAVITY;  Surgeon: Elisabeth Valli BIRCH, MD;  Location: [facility];  Service: Urology;  Laterality: N/A;   SPACE OAR INSTILLATION N/A 08/14/2023    Procedure: SPACE OAR INSTILLATION;   Surgeon: Elisabeth Valli BIRCH, MD;  Location: [facility];  Service: Urology;  Laterality: N/A;   TOTAL HIP ARTHROPLASTY Right 01/11/2009    @[facility]  by dr melodi   TOTAL HIP ARTHROPLASTY Left 08/19/2013    Procedure: TOTAL HIP ARTHROPLASTY ANTERIOR APPROACH;  Surgeon: Maude KANDICE Herald, MD;  Location: [facility];  Service: Orthopedics;  Laterality: Left;                Home Medications  Prior to Admission medications  Medication Sig Start Date End Date Taking? Authorizing Provider  albuterol  (PROVENTIL ) (2.5 MG/3ML) 0.083% nebulizer solution Take 3 mLs (2.5 mg total) by nebulization every 6 (six) hours as needed for wheezing or shortness of breath. 06/11/24     Olalere, Adewale A, MD  albuterol  (VENTOLIN  HFA) 108 (90 Base) MCG/ACT inhaler Inhale 2 puffs into the lungs every 4 (four) hours as needed for wheezing or shortness of breath. 04/07/24     Enedelia Dorna HERO, FNP  Ascorbic Acid  (VITAMIN C ) 1000 MG tablet Take 2,000 mg by mouth daily.       [provider]  azithromycin  (ZITHROMAX  Z-PAK) 250 MG tablet Take 2 tablets day 1 and then 1 daily for 4 days 10/02/24     Neda Hammond A, MD  Brensocatib (BRINSUPRI) 10 MG TABS Take 10 mg by mouth daily.       [provider]  Cholecalciferol (VITAMIN D3) 1000 units CAPS Take 1,000 Units by mouth daily.       [provider]  clopidogrel  (PLAVIX ) 75 MG tablet Take 1 tablet (75 mg total) by mouth daily. 11/29/23     Tolia, Sunit, DO  feeding supplement (ENSURE ENLIVE / ENSURE PLUS) LIQD Take 237 mLs by mouth 2 (two) times daily between meals. 09/27/23     Christobal Guadalajara, MD  furosemide  (LASIX ) 20 MG tablet Take 1 tablet (20 mg total) by mouth daily. 10/02/24 10/12/24   Neda Hammond LABOR, MD  gabapentin  (NEURONTIN ) 800 MG tablet Take 800 mg by mouth See admin instructions. Take 800 mg by mouth three times a day and an additional 800 mg once a day as needed for pain 02/01/22     [provider]  HYDROcodone -acetaminophen  (NORCO) 10-325 MG tablet Take 1 tablet by mouth See admin instructions. Take 1 tablet by mouth three times a day and an additional 1 tablet up to two times a day as needed for pain       [provider]  ibuprofen (GOODSENSE IBUPROFEN) 200 MG tablet Take 800 mg by mouth every 6 (six) hours as needed (for pain).       [provider]  losartan  (COZAAR ) 100 MG tablet TAKE 1 TABLET(100 MG) BY MOUTH DAILY 05/22/24     Tolia, Sunit, DO  metFORMIN  (GLUCOPHAGE ) 500 MG tablet Take 1 tablet (500 mg total) by mouth 2 (two) times daily with a meal. Resume on Sunday 02/19/2022 Patient taking differently: Take 500 mg by mouth 2 (two) times daily with a meal. 02/17/22     Patwardhan, Manish J, MD  metoprolol  succinate (TOPROL  XL) 50 MG 24 hr tablet Take 1 tablet (50 mg total) by mouth daily. Hold for hr< 60 or sbp <120 11/29/23     Tolia, Sunit, DO  nitroGLYCERIN  (NITROSTAT ) 0.4 MG SL tablet PLACE 1 TABELT UNDER THE TONGUE EVERY 5 MINUTES AS NEEDED FOR CHEST PAIN 03/19/24     Michele, Sunit, DO  predniSONE  (DELTASONE ) 20 MG tablet Take 1 tablet (20 mg total) by mouth daily with breakfast. 10/02/24     Olalere, Adewale A, MD  rosuvastatin  (CRESTOR ) 40 MG tablet TAKE 1 TABLET BY MOUTH AT BEDTIME Patient taking differently: Take 40 mg by mouth at bedtime. 07/07/22     Tolia, Sunit, DO  tamsulosin  (FLOMAX ) 0.4 MG CAPS capsule Take 0.8 mg by mouth at bedtime.       [provider]  tiZANidine  (ZANAFLEX ) 4 MG tablet Take 4 mg by mouth every  8 (eight) hours as needed for muscle spasms. 02/15/23     [provider]  TRELEGY ELLIPTA 100-62.5-25 MCG/ACT AEPB Take 1 puff by mouth daily as needed (for flares). 12/21/21     [provider]      Family History      Family History  Problem Relation Age of Onset   Diabetes Mother     Hypertension Father     Lupus Sister            Social History [Social History]  [Social History]      Tobacco Use    Smoking status: Former      Types: Cigarettes      Start date: 1992      Quit date: 1972      Years since quitting: 54.1   Smokeless tobacco: Never   Tobacco comments:      08-08-2023  per pt quit smoking 1992,  started age 34,   Vaping Use   Vaping status: Never Used  Substance Use Topics   Alcohol use: Yes      Alcohol/week: 7.0 standard drinks of alcohol      Types: 7 Cans of beer per week      Comment: 08-08-2023  one beer per night ,  16 oz   Drug use: Not Currently      Types: Marijuana      Comment: 10/01/2013 last drug use was 10 yr ago       Allergies              Patient has no known allergies.     Review of Systems Review of Systems  Constitutional:  Negative for chills, fatigue and fever.  HENT:  Negative for congestion, rhinorrhea and sore throat.   Respiratory:  Positive for shortness of breath. Negative for cough and wheezing.   Cardiovascular:  Negative for chest pain and palpitations.  Gastrointestinal:  Negative for abdominal pain, diarrhea, nausea and vomiting.  Neurological:  Negative for weakness, light-headedness and headaches.       Physical Exam Updated Vital Signs BP 138/81 (BP Location: Left Arm) Comment (BP Location): large cuff  Pulse 74   Temp 97.8 F (36.6 C) (Oral)   Resp (!) 24   SpO2 96% Comment: read with patient perfectly still and no talking   Physical Exam Constitutional:      Appearance: Normal appearance.  HENT:     Nose: Nose normal.     Mouth/Throat:     Mouth: Mucous membranes are moist.     Pharynx: Oropharynx is clear. No oropharyngeal exudate or posterior oropharyngeal erythema.  Cardiovascular:     Rate and Rhythm: Normal rate and regular rhythm.     Pulses: Normal pulses.     Heart sounds: Normal heart sounds.  Pulmonary:     Effort: Tachypnea present.     Breath sounds: Decreased breath sounds present. No wheezing, rhonchi or rales.     Comments: Mild tachypnea and decreased breath sounds throughout. Able to  speak in complete sentences between breaths.  Abdominal:     Palpations: Abdomen is soft.     Tenderness: There is no abdominal tenderness.  Musculoskeletal:     Cervical back: Neck supple.  Lymphadenopathy:     Cervical: No cervical adenopathy.  Skin:    General: Skin is warm and dry.     Coloration: Skin is not cyanotic.  Neurological:     Mental Status: He is alert.  ED Treatments / Results  Labs (all labs ordered are listed, but only abnormal results are displayed) Labs Reviewed - No data to display   EKG   Radiology Imaging Results (Last 48 hours)  DG Chest 2 View Result Date: 11/26/2024 CLINICAL DATA:  Shortness of breath for 2 days EXAM: CHEST - 2 VIEW COMPARISON:  April 07, 2024 FINDINGS: Stable cardiomediastinal silhouette. Both lungs are clear. The visualized skeletal structures are unremarkable. IMPRESSION: No active cardiopulmonary disease. Electronically Signed   By: Lynwood Landy Raddle M.D.   On: 11/26/2024 10:34        Procedures Procedures (including critical care time)   Medications Ordered in ED Medications  ipratropium-albuterol  (DUONEB) 0.5-2.5 (3) MG/3ML nebulizer solution 3 mL (has no administration in time range)  methylPREDNISolone  sodium succinate (SOLU-MEDROL ) 125 mg/2 mL injection 125 mg (has no administration in time range)        Initial Impression / Assessment and Plan / ED Course  I have reviewed the triage vital signs and the nursing notes.   Pertinent labs & imaging results that were available during my care of the patient were reviewed by me and considered in my medical decision making (see chart for details).   ED Course   CXR is negative.    Will admin Duoneb 2.5/0.5 mg via HHN and 125 of Solumedrol IM then reassess.    Upon reassessment pt has subjective improvement. Lung sounds are slightly improved air movement bilaterally and can now appreciate a wheeze in the R lower lobe. Pt was ambulated with SpO2 and it started at 98% on  RA while at rest and lowest it dropped was 93% with ambulation.   COPD Exacerbation   Will start a Medrol  DosePak and refill his albuterol  MDI and albuterol  2.5mg /26ml neb solution. Instructed to do scheduled neb treatments every 4 hours while awake for the next 24 hours then return to his twice daily routine with neb and PRN with albuterol  MDI. Pt counseled to monitor SpO2 regularly at home. Strict ER precautions given if worsening of symptoms, SpO2 below 90%, increased sputum production, or signs of infection.    Red flag symptoms reviewed and return precautions given.      Final Clinical Impressions(s) / ED Diagnoses    Final diagnoses:   1. SOB (shortness of breath)   2. COPD exacerbation (HCC)    Meds ordered this encounter  Medications   ipratropium-albuterol  (DUONEB) 0.5-2.5 (3) MG/3ML nebulizer solution 3 mL   methylPREDNISolone  sodium succinate (SOLU-MEDROL ) 125 mg/2 mL injection 125 mg   albuterol  (PROVENTIL ) (2.5 MG/3ML) 0.083% nebulizer solution    Sig: Take 3 mLs (2.5 mg total) by nebulization every 6 (six) hours as needed for wheezing or shortness of breath.    Dispense:  75 mL    Refill:  2   albuterol  (VENTOLIN  HFA) 108 (90 Base) MCG/ACT inhaler    Sig: Inhale 2 puffs into the lungs every 4 (four) hours as needed for wheezing or shortness of breath.    Dispense:  18 g    Refill:  2   methylPREDNISolone  (MEDROL  DOSEPAK) 4 MG TBPK tablet    Sig: Take as directed.    Dispense:  1 each    Refill:  0    Follow-up Information     Schedule an appointment as soon as possible for a visit  with Tamela Maudlin, MD.   Specialty: Gastroenterology Why: For follow up. Contact information: [address] [place] [address] [phone]  [facility].   Specialty: Emergency Medicine Why: If symptoms worsen in any way. Contact information: [address] [place]  [address] [phone]                         Rolinda Rogue, MD 11/26/24 1341  "

## 2024-11-27 ENCOUNTER — Other Ambulatory Visit: Payer: Self-pay

## 2025-01-12 ENCOUNTER — Ambulatory Visit: Admitting: Pulmonary Disease

## 2025-01-12 ENCOUNTER — Encounter
